# Patient Record
Sex: Female | Born: 1940 | Race: White | Hispanic: No | Marital: Married | Smoking: Never smoker
Health system: Southern US, Community
[De-identification: ages and names within clinical notes are randomized; demographics above are authoritative.]

## PROBLEM LIST (undated history)

## (undated) DIAGNOSIS — M1990 Unspecified osteoarthritis, unspecified site: Secondary | ICD-10-CM

## (undated) DIAGNOSIS — E785 Hyperlipidemia, unspecified: Secondary | ICD-10-CM

## (undated) DIAGNOSIS — K439 Ventral hernia without obstruction or gangrene: Secondary | ICD-10-CM

## (undated) DIAGNOSIS — B009 Herpesviral infection, unspecified: Secondary | ICD-10-CM

## (undated) DIAGNOSIS — I5030 Unspecified diastolic (congestive) heart failure: Secondary | ICD-10-CM

## (undated) DIAGNOSIS — C50912 Malignant neoplasm of unspecified site of left female breast: Secondary | ICD-10-CM

## (undated) DIAGNOSIS — J42 Unspecified chronic bronchitis: Secondary | ICD-10-CM

## (undated) DIAGNOSIS — K219 Gastro-esophageal reflux disease without esophagitis: Secondary | ICD-10-CM

## (undated) DIAGNOSIS — I890 Lymphedema, not elsewhere classified: Secondary | ICD-10-CM

## (undated) DIAGNOSIS — I10 Essential (primary) hypertension: Secondary | ICD-10-CM

## (undated) DIAGNOSIS — C50911 Malignant neoplasm of unspecified site of right female breast: Secondary | ICD-10-CM

## (undated) DIAGNOSIS — M779 Enthesopathy, unspecified: Secondary | ICD-10-CM

## (undated) DIAGNOSIS — R911 Solitary pulmonary nodule: Secondary | ICD-10-CM

## (undated) DIAGNOSIS — I441 Atrioventricular block, second degree: Secondary | ICD-10-CM

## (undated) DIAGNOSIS — K589 Irritable bowel syndrome without diarrhea: Secondary | ICD-10-CM

## (undated) DIAGNOSIS — E119 Type 2 diabetes mellitus without complications: Secondary | ICD-10-CM

## (undated) DIAGNOSIS — K760 Fatty (change of) liver, not elsewhere classified: Secondary | ICD-10-CM

## (undated) DIAGNOSIS — J449 Chronic obstructive pulmonary disease, unspecified: Secondary | ICD-10-CM

## (undated) DIAGNOSIS — J45909 Unspecified asthma, uncomplicated: Secondary | ICD-10-CM

## (undated) DIAGNOSIS — G4730 Sleep apnea, unspecified: Secondary | ICD-10-CM

## (undated) HISTORY — DX: Essential (primary) hypertension: I10

## (undated) HISTORY — PX: CATARACT EXTRACTION, BILATERAL: SHX1313

## (undated) HISTORY — DX: Unspecified asthma, uncomplicated: J45.909

## (undated) HISTORY — DX: Type 2 diabetes mellitus without complications: E11.9

## (undated) HISTORY — PX: TUBAL LIGATION: SHX77

## (undated) HISTORY — DX: Ventral hernia without obstruction or gangrene: K43.9

## (undated) HISTORY — PX: HAND LIGAMENT RECONSTRUCTION: SHX1726

## (undated) HISTORY — DX: Lymphedema, not elsewhere classified: I89.0

## (undated) HISTORY — DX: Fatty (change of) liver, not elsewhere classified: K76.0

## (undated) HISTORY — DX: Atrioventricular block, second degree: I44.1

## (undated) HISTORY — DX: Unspecified osteoarthritis, unspecified site: M19.90

## (undated) HISTORY — PX: BREAST LUMPECTOMY: SHX2

## (undated) HISTORY — DX: Herpesviral infection, unspecified: B00.9

## (undated) HISTORY — DX: Hyperlipidemia, unspecified: E78.5

## (undated) HISTORY — DX: Solitary pulmonary nodule: R91.1

## (undated) HISTORY — DX: Malignant neoplasm of unspecified site of right female breast: C50.911

## (undated) HISTORY — PX: COLON SURGERY: SHX602

## (undated) HISTORY — PX: CATARACT EXTRACTION W/ INTRAOCULAR LENS  IMPLANT, BILATERAL: SHX1307

## (undated) HISTORY — DX: Unspecified diastolic (congestive) heart failure: I50.30

## (undated) HISTORY — PX: BREAST SURGERY: SHX581

---
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
## 1986-04-19 HISTORY — PX: LUNG LOBECTOMY: SHX167

## 2011-04-20 DIAGNOSIS — K760 Fatty (change of) liver, not elsewhere classified: Secondary | ICD-10-CM

## 2011-04-20 DIAGNOSIS — R911 Solitary pulmonary nodule: Secondary | ICD-10-CM

## 2011-04-20 HISTORY — DX: Solitary pulmonary nodule: R91.1

## 2011-04-20 HISTORY — DX: Fatty (change of) liver, not elsewhere classified: K76.0

## 2011-04-20 HISTORY — PX: BREAST BIOPSY: SHX20

## 2011-05-13 DIAGNOSIS — E119 Type 2 diabetes mellitus without complications: Secondary | ICD-10-CM | POA: Diagnosis not present

## 2011-05-13 DIAGNOSIS — Z01818 Encounter for other preprocedural examination: Secondary | ICD-10-CM | POA: Diagnosis not present

## 2011-05-13 DIAGNOSIS — E785 Hyperlipidemia, unspecified: Secondary | ICD-10-CM | POA: Diagnosis not present

## 2011-05-13 DIAGNOSIS — G4733 Obstructive sleep apnea (adult) (pediatric): Secondary | ICD-10-CM | POA: Diagnosis not present

## 2011-05-13 DIAGNOSIS — J45909 Unspecified asthma, uncomplicated: Secondary | ICD-10-CM | POA: Diagnosis not present

## 2011-05-20 DIAGNOSIS — Z803 Family history of malignant neoplasm of breast: Secondary | ICD-10-CM | POA: Diagnosis not present

## 2011-05-20 DIAGNOSIS — C50919 Malignant neoplasm of unspecified site of unspecified female breast: Secondary | ICD-10-CM | POA: Diagnosis not present

## 2011-06-11 DIAGNOSIS — M259 Joint disorder, unspecified: Secondary | ICD-10-CM | POA: Diagnosis not present

## 2011-06-11 DIAGNOSIS — M25519 Pain in unspecified shoulder: Secondary | ICD-10-CM | POA: Diagnosis not present

## 2011-06-11 DIAGNOSIS — M25569 Pain in unspecified knee: Secondary | ICD-10-CM | POA: Diagnosis not present

## 2011-06-11 DIAGNOSIS — Z01818 Encounter for other preprocedural examination: Secondary | ICD-10-CM | POA: Diagnosis not present

## 2011-06-14 DIAGNOSIS — D0590 Unspecified type of carcinoma in situ of unspecified breast: Secondary | ICD-10-CM | POA: Diagnosis not present

## 2011-06-14 DIAGNOSIS — N6019 Diffuse cystic mastopathy of unspecified breast: Secondary | ICD-10-CM | POA: Diagnosis not present

## 2011-06-14 DIAGNOSIS — C50919 Malignant neoplasm of unspecified site of unspecified female breast: Secondary | ICD-10-CM | POA: Diagnosis not present

## 2011-06-14 DIAGNOSIS — N6489 Other specified disorders of breast: Secondary | ICD-10-CM | POA: Diagnosis not present

## 2011-06-18 DIAGNOSIS — M1710 Unilateral primary osteoarthritis, unspecified knee: Secondary | ICD-10-CM | POA: Diagnosis not present

## 2011-06-21 DIAGNOSIS — Z170 Estrogen receptor positive status [ER+]: Secondary | ICD-10-CM | POA: Diagnosis not present

## 2011-06-21 DIAGNOSIS — C50919 Malignant neoplasm of unspecified site of unspecified female breast: Secondary | ICD-10-CM | POA: Diagnosis not present

## 2011-06-22 DIAGNOSIS — C50919 Malignant neoplasm of unspecified site of unspecified female breast: Secondary | ICD-10-CM | POA: Diagnosis not present

## 2011-06-22 DIAGNOSIS — IMO0002 Reserved for concepts with insufficient information to code with codable children: Secondary | ICD-10-CM | POA: Diagnosis not present

## 2011-06-22 DIAGNOSIS — K7689 Other specified diseases of liver: Secondary | ICD-10-CM | POA: Diagnosis not present

## 2011-06-22 DIAGNOSIS — J8410 Pulmonary fibrosis, unspecified: Secondary | ICD-10-CM | POA: Diagnosis not present

## 2011-06-22 DIAGNOSIS — K449 Diaphragmatic hernia without obstruction or gangrene: Secondary | ICD-10-CM | POA: Diagnosis not present

## 2011-06-22 DIAGNOSIS — K439 Ventral hernia without obstruction or gangrene: Secondary | ICD-10-CM | POA: Diagnosis not present

## 2011-06-25 DIAGNOSIS — C50919 Malignant neoplasm of unspecified site of unspecified female breast: Secondary | ICD-10-CM | POA: Diagnosis not present

## 2011-06-25 DIAGNOSIS — Z170 Estrogen receptor positive status [ER+]: Secondary | ICD-10-CM | POA: Diagnosis not present

## 2011-06-25 DIAGNOSIS — Z01818 Encounter for other preprocedural examination: Secondary | ICD-10-CM | POA: Diagnosis not present

## 2011-07-07 DIAGNOSIS — C50919 Malignant neoplasm of unspecified site of unspecified female breast: Secondary | ICD-10-CM | POA: Diagnosis not present

## 2011-07-07 DIAGNOSIS — Z170 Estrogen receptor positive status [ER+]: Secondary | ICD-10-CM | POA: Diagnosis not present

## 2011-07-20 DIAGNOSIS — C50019 Malignant neoplasm of nipple and areola, unspecified female breast: Secondary | ICD-10-CM | POA: Diagnosis not present

## 2011-10-08 ENCOUNTER — Encounter: Payer: Self-pay | Admitting: Family Medicine

## 2011-10-08 ENCOUNTER — Ambulatory Visit (INDEPENDENT_AMBULATORY_CARE_PROVIDER_SITE_OTHER): Payer: Medicare Other | Admitting: Family Medicine

## 2011-10-08 VITALS — BP 138/58 | HR 97 | Resp 18 | Ht 65.5 in | Wt 259.1 lb

## 2011-10-08 DIAGNOSIS — C50919 Malignant neoplasm of unspecified site of unspecified female breast: Secondary | ICD-10-CM | POA: Diagnosis not present

## 2011-10-08 DIAGNOSIS — G4733 Obstructive sleep apnea (adult) (pediatric): Secondary | ICD-10-CM

## 2011-10-08 DIAGNOSIS — M1990 Unspecified osteoarthritis, unspecified site: Secondary | ICD-10-CM

## 2011-10-08 DIAGNOSIS — E119 Type 2 diabetes mellitus without complications: Secondary | ICD-10-CM | POA: Diagnosis not present

## 2011-10-08 DIAGNOSIS — R5381 Other malaise: Secondary | ICD-10-CM

## 2011-10-08 DIAGNOSIS — H269 Unspecified cataract: Secondary | ICD-10-CM

## 2011-10-08 DIAGNOSIS — I10 Essential (primary) hypertension: Secondary | ICD-10-CM

## 2011-10-08 DIAGNOSIS — J45909 Unspecified asthma, uncomplicated: Secondary | ICD-10-CM

## 2011-10-08 DIAGNOSIS — R5382 Chronic fatigue, unspecified: Secondary | ICD-10-CM

## 2011-10-08 DIAGNOSIS — E785 Hyperlipidemia, unspecified: Secondary | ICD-10-CM

## 2011-10-08 MED ORDER — ANASTROZOLE 1 MG PO TABS: 1.0000 mg | ORAL_TABLET | Freq: Every day | ORAL | Status: DC

## 2011-10-08 NOTE — Patient Instructions (Addendum)
I will get your records  I will send your medications to [facility] I will refer you to oncology  F/U 2 months

## 2011-10-11 ENCOUNTER — Encounter: Payer: Self-pay | Admitting: Family Medicine

## 2011-10-11 DIAGNOSIS — J45909 Unspecified asthma, uncomplicated: Secondary | ICD-10-CM | POA: Insufficient documentation

## 2011-10-11 DIAGNOSIS — E119 Type 2 diabetes mellitus without complications: Secondary | ICD-10-CM | POA: Insufficient documentation

## 2011-10-11 DIAGNOSIS — G4733 Obstructive sleep apnea (adult) (pediatric): Secondary | ICD-10-CM | POA: Insufficient documentation

## 2011-10-11 DIAGNOSIS — R5382 Chronic fatigue, unspecified: Secondary | ICD-10-CM | POA: Insufficient documentation

## 2011-10-11 DIAGNOSIS — C50912 Malignant neoplasm of unspecified site of left female breast: Secondary | ICD-10-CM | POA: Insufficient documentation

## 2011-10-11 DIAGNOSIS — E669 Obesity, unspecified: Secondary | ICD-10-CM | POA: Insufficient documentation

## 2011-10-11 DIAGNOSIS — E785 Hyperlipidemia, unspecified: Secondary | ICD-10-CM | POA: Insufficient documentation

## 2011-10-11 DIAGNOSIS — M1990 Unspecified osteoarthritis, unspecified site: Secondary | ICD-10-CM | POA: Insufficient documentation

## 2011-10-11 DIAGNOSIS — I10 Essential (primary) hypertension: Secondary | ICD-10-CM | POA: Insufficient documentation

## 2011-10-11 DIAGNOSIS — E1169 Type 2 diabetes mellitus with other specified complication: Secondary | ICD-10-CM | POA: Insufficient documentation

## 2011-10-11 MED ORDER — ANASTROZOLE 1 MG PO TABS: 1.0000 mg | ORAL_TABLET | Freq: Every day | ORAL | Status: DC

## 2011-10-11 MED ORDER — TRIAMTERENE-HCTZ 75-50 MG PO TABS: ORAL_TABLET | ORAL | Status: DC

## 2011-10-11 MED ORDER — MONTELUKAST SODIUM 10 MG PO TABS: 10.0000 mg | ORAL_TABLET | Freq: Every day | ORAL | Status: DC

## 2011-10-11 MED ORDER — NAPROXEN 500 MG PO TABS: 500.0000 mg | ORAL_TABLET | Freq: Two times a day (BID) | ORAL | Status: DC | PRN

## 2011-10-11 MED ORDER — GLIPIZIDE 10 MG PO TABS: 10.0000 mg | ORAL_TABLET | Freq: Two times a day (BID) | ORAL | Status: DC

## 2011-10-11 MED ORDER — MOMETASONE FURO-FORMOTEROL FUM 100-5 MCG/ACT IN AERO: 2.0000 | INHALATION_SPRAY | Freq: Two times a day (BID) | RESPIRATORY_TRACT | Status: DC

## 2011-10-11 MED ORDER — METFORMIN HCL 1000 MG PO TABS: 1000.0000 mg | ORAL_TABLET | Freq: Two times a day (BID) | ORAL | Status: DC

## 2011-10-11 MED ORDER — SIMVASTATIN 40 MG PO TABS: 40.0000 mg | ORAL_TABLET | Freq: Every evening | ORAL | Status: DC

## 2011-10-11 NOTE — Assessment & Plan Note (Signed)
Maintained on inhalers pt would like to establish with pulmonary

## 2011-10-11 NOTE — Assessment & Plan Note (Signed)
Blood pressure, suboptimal today, will recheck next visit she may need entire tablet

## 2011-10-11 NOTE — Assessment & Plan Note (Signed)
Left breast cancer, recently diagnosed will refer to oncology for further treatments on Arimidex

## 2011-10-11 NOTE — Assessment & Plan Note (Signed)
No oxygen no CPAP currently

## 2011-10-11 NOTE — Assessment & Plan Note (Signed)
Chronic problem, multiple co-morbidites, sedentary

## 2011-10-11 NOTE — Progress Notes (Addendum)
  Subjective:    Patient ID: Julie Clay, female    DOB: April 04, 1941, 71 y.o.   MRN: [id number]  HPI Patient here to establish care. She was a previous patient of [facility] in [place]. Moved here a few weeks ago.   Breast Cancer: She was recently diagnosed with breast cancer she is currently on Arimdex diagnosed December 2012 in the left breast. She needs followup with an oncologist for further treatment.  DM- A1C unknown on Metofrmin/glipizide, does not check CBG on regular basis, she stopped actos due to potential side effect of bladder cancer  HTN- takes on 1/2 tablet of her maxide daily, also used for leg swelling Asthma-diagnosed in childhood she is maintained on albuterol and Dulera Chronic fatigue- has suffered for many years, unable to loose weight, has history of OSA but no CPAP OA- followed by ortho rarely uses Motrin,also has heel spurs, she uses a motorized scooter, gets injections in knees as needed   Specialist needed- Oncology/Pulmonary/Opthalmology/Orthopedic  Bone Density-done and normal  Refuses Colonoscopy  Wears orthotics for recurrent blistering on foot High allergy to [place]- has Rhus Toxcodans pills at home    Review of Systems - per above    GEN- +fatigue, fever, weight loss,weakness, recent illness HEENT- denies eye drainage, change in vision, nasal discharge, CVS- denies chest pain, palpitations, +,leg swelling RESP- denies SOB, cough, wheeze ABD- denies N/V, change in stools, abd pain GU- denies dysuria, hematuria, dribbling, incontinence MSK- denies joint pain, muscle aches, injury Neuro- denies headache, dizziness, syncope, seizure activity      Objective:   Physical Exam GEN- NAD, alert and oriented x3, obese  HEENT- PERRL, EOMI, non injected sclera, pink conjunctiva, MMM, oropharynx clear Neck- Supple,no LAD CVS- RRR, no murmur RESP-CTAB ABD-NABS,soft, NT,large pannus EXT- +pedal  edema Pulses- Radial, DP-  2+        Assessment & Plan:    45 minutes spent with pt on NP appt

## 2011-10-11 NOTE — Assessment & Plan Note (Signed)
Continue zocor 

## 2011-10-11 NOTE — Assessment & Plan Note (Signed)
Obtain records, refer to opthalmology  Continue oral meds

## 2011-10-11 NOTE — Addendum Note (Signed)
Addended by: [name] F on: 10/11/2011 02:06 PM   Modules accepted: Orders

## 2011-10-12 ENCOUNTER — Encounter: Payer: Self-pay | Admitting: Family Medicine

## 2011-10-12 ENCOUNTER — Telehealth: Payer: Self-pay | Admitting: Family Medicine

## 2011-10-12 NOTE — Telephone Encounter (Signed)
Please let pt know she needs to go to appt to establish care and see whether or not radiation adjunct is needed

## 2011-10-13 ENCOUNTER — Telehealth: Payer: Self-pay | Admitting: Family Medicine

## 2011-10-13 NOTE — Telephone Encounter (Signed)
Spoke with patient and at this time she is refusing the appt.

## 2011-10-13 NOTE — Telephone Encounter (Signed)
I spoke to pt and she said they told her she would be seeing a chemo Dr and she didn't need chemo. The office called back and said she refused appt, were you going to convince her to go or do they give it to someone else?

## 2011-10-14 ENCOUNTER — Telehealth: Payer: Self-pay | Admitting: Family Medicine

## 2011-10-14 NOTE — Telephone Encounter (Signed)
Records were faxed to them already, and now sent to scanner, we do not have anything else handy.

## 2011-10-14 NOTE — Telephone Encounter (Signed)
Do you still have the records from her previous Dr or have they went to be scanned already?  They are wanting them faxed over to them

## 2011-10-14 NOTE — Telephone Encounter (Signed)
Spoke with pt, she will call and reschedule appt, she has new diagnosis of breast cancer, she needs radiation per pt, she needs to establish with the cancer center so that care can be coordinated. She agrees to go to appt to do so

## 2011-10-15 ENCOUNTER — Encounter (HOSPITAL_COMMUNITY): Payer: Medicare Other | Attending: Oncology | Admitting: Oncology

## 2011-10-15 ENCOUNTER — Encounter (HOSPITAL_COMMUNITY): Payer: Self-pay | Admitting: Oncology

## 2011-10-15 VITALS — BP 132/75 | HR 72 | Temp 98.2°F | Ht 64.5 in | Wt 259.6 lb

## 2011-10-15 DIAGNOSIS — E785 Hyperlipidemia, unspecified: Secondary | ICD-10-CM | POA: Diagnosis not present

## 2011-10-15 DIAGNOSIS — I10 Essential (primary) hypertension: Secondary | ICD-10-CM | POA: Diagnosis not present

## 2011-10-15 DIAGNOSIS — Z170 Estrogen receptor positive status [ER+]: Secondary | ICD-10-CM

## 2011-10-15 DIAGNOSIS — C50919 Malignant neoplasm of unspecified site of unspecified female breast: Secondary | ICD-10-CM

## 2011-10-15 NOTE — Patient Instructions (Addendum)
Julie Clay  [id number] Nov 08, 1940 Dr. Glenford Peers   [facility]  Discharge Instructions  RECOMMENDATIONS MADE BY THE CONSULTANT AND ANY TEST RESULTS WILL BE SENT TO YOUR REFERRING DOCTOR.   EXAM FINDINGS BY MD TODAY AND SIGNS AND SYMPTOMS TO REPORT TO CLINIC OR PRIMARY MD: Exam and discussion per MD.  He will be in touch with you after he talks with the Radiation Oncologist.  If you have not heard from him by Wednesday, call us.  Tobie Lords, RN at [phone])  MEDICATIONS PRESCRIBED: continue arimidex   INSTRUCTIONS GIVEN AND DISCUSSED: Other :  Report any new lumps, bone pain or shortness of breath.  SPECIAL INSTRUCTIONS/FOLLOW-UP: Return to Clinic in 6 months.   I acknowledge that I have been informed and understand all the instructions given to me and received a copy. I do not have any more questions at this time, but understand that I may call the Specialty Clinic at [facility] at [phone] during business hours should I have any further questions or need assistance in obtaining follow-up care.    __________________________________________  _____________  __________ Signature of Patient or Authorized Representative            Date                   Time    __________________________________________ Nurse's Signature
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
## 2011-10-15 NOTE — Telephone Encounter (Signed)
Office is aware.

## 2011-10-20 ENCOUNTER — Telehealth (HOSPITAL_COMMUNITY): Payer: Self-pay | Admitting: Oncology

## 2011-11-01 ENCOUNTER — Other Ambulatory Visit: Payer: Self-pay | Admitting: Family Medicine

## 2011-11-02 ENCOUNTER — Other Ambulatory Visit (HOSPITAL_COMMUNITY): Payer: Self-pay | Admitting: Oncology

## 2011-11-02 ENCOUNTER — Telehealth (HOSPITAL_COMMUNITY): Payer: Self-pay | Admitting: Oncology

## 2011-11-02 NOTE — Progress Notes (Signed)
This lady had breast cancer resected on 05/20/2011 followed by reexcision in February 2013. I finally received the pathology reports and she had a 1.5 x 1.2 x 1.0 cm tumor with reexcision showing no further residual disease. Her initial biopsy was in December 2012 with ER PR receptors and HER-2 evaluation done on the tumor at that time. Oncotype was also done. Her ER receptor were strongly positive as were her progesterone receptors and her HER-2 was nonamplified. Her Oncotype breast cancer recurrence score was 0 giving her a risk of recurrence at 10 years of 3% after 5 years of tamoxifen.  Her sentinel lymph node was negative as well and this was a grade 2 tumor, not a grade 3 tumor as said in one of the notes. Therefore we have received the pathology reports and we will try to scan and into the system.

## 2011-11-04 DIAGNOSIS — H52229 Regular astigmatism, unspecified eye: Secondary | ICD-10-CM | POA: Diagnosis not present

## 2011-11-04 DIAGNOSIS — E119 Type 2 diabetes mellitus without complications: Secondary | ICD-10-CM | POA: Diagnosis not present

## 2011-11-04 DIAGNOSIS — Z0100 Encounter for examination of eyes and vision without abnormal findings: Secondary | ICD-10-CM | POA: Diagnosis not present

## 2011-11-04 DIAGNOSIS — Z961 Presence of intraocular lens: Secondary | ICD-10-CM | POA: Diagnosis not present

## 2011-11-11 ENCOUNTER — Telehealth: Payer: Self-pay | Admitting: Family Medicine

## 2011-11-11 ENCOUNTER — Other Ambulatory Visit: Payer: Self-pay

## 2011-11-11 DIAGNOSIS — M1710 Unilateral primary osteoarthritis, unspecified knee: Secondary | ICD-10-CM

## 2011-11-11 DIAGNOSIS — M179 Osteoarthritis of knee, unspecified: Secondary | ICD-10-CM

## 2011-11-11 MED ORDER — ALBUTEROL SULFATE HFA 108 (90 BASE) MCG/ACT IN AERS: 2.0000 | INHALATION_SPRAY | Freq: Four times a day (QID) | RESPIRATORY_TRACT | Status: DC | PRN

## 2011-11-11 MED ORDER — GLUCOSE BLOOD VI STRP: ORAL_STRIP | Status: DC

## 2011-11-11 NOTE — Telephone Encounter (Signed)
Pt needs to complete the [facility] form for [place] to obtain records, please let her know and apologize for any inconvience I will refer for her OA of the knee to ortho for injections

## 2011-11-11 NOTE — Telephone Encounter (Signed)
Pt aware and will come by tomorrow

## 2011-11-12 ENCOUNTER — Other Ambulatory Visit: Payer: Self-pay

## 2011-11-12 MED ORDER — GLUCOSE BLOOD VI STRP: ORAL_STRIP | Status: AC

## 2011-11-12 MED ORDER — ALBUTEROL SULFATE HFA 108 (90 BASE) MCG/ACT IN AERS: 2.0000 | INHALATION_SPRAY | Freq: Four times a day (QID) | RESPIRATORY_TRACT | Status: DC | PRN

## 2011-11-15 ENCOUNTER — Ambulatory Visit (INDEPENDENT_AMBULATORY_CARE_PROVIDER_SITE_OTHER): Payer: Medicare Other | Admitting: Internal Medicine

## 2011-11-15 ENCOUNTER — Encounter: Payer: Self-pay | Admitting: Internal Medicine

## 2011-11-15 VITALS — BP 122/76 | HR 82 | Temp 98.7°F | Ht 65.0 in | Wt 261.2 lb

## 2011-11-15 DIAGNOSIS — J45909 Unspecified asthma, uncomplicated: Secondary | ICD-10-CM | POA: Diagnosis not present

## 2011-11-15 DIAGNOSIS — R0989 Other specified symptoms and signs involving the circulatory and respiratory systems: Secondary | ICD-10-CM | POA: Diagnosis not present

## 2011-11-15 DIAGNOSIS — R0600 Dyspnea, unspecified: Secondary | ICD-10-CM

## 2011-11-15 DIAGNOSIS — R0609 Other forms of dyspnea: Secondary | ICD-10-CM | POA: Diagnosis not present

## 2011-11-15 NOTE — Assessment & Plan Note (Signed)
Symptoms are markedly disproportionate to objective findings and not clear this is a lung problem but pt does appear to have difficult airway management issues. DDX of  difficult airways managment all start with A and  include Adherence, Ace Inhibitors, Acid Reflux, Active Sinus Disease, Alpha 1 Antitripsin deficiency, Anxiety masquerading as Airways dz,  ABPA,  allergy(esp in young), Aspiration (esp in elderly), Adverse effects of DPI,  Active smokers, plus two Bs  = Bronchiectasis and Beta blocker use..and one C= CHF  Adherence is always the initial "prime suspect" and is a multilayered concern that requires a "trust but verify" approach in every patient - starting with knowing how to use medications, especially inhalers, correctly, keeping up with refills and understanding the fundamental difference between maintenance and prns vs those medications only taken for a very short course and then stopped and not refilled.  The proper method of use, as well as anticipated side effects, of a metered-dose inhaler are discussed and demonstrated to the patient. Improved effectiveness after extensive coaching during this visit to a level of approximately  50% and needs more work for optimal delivery  ? Acid reflux > diet reviewed

## 2011-11-15 NOTE — Patient Instructions (Addendum)
dulera 100 Take 2 puffs first thing in am and then another 2 puffs about 12 hours later.     Only use your albuterol as a rescue medication(plan B= albuterol hfa/puffer and plan C nebulizer)  to be used if you can't catch your breath by resting or doing a relaxed purse lip breathing pattern. The less you use it, the better it will work when you need it.   GERD (REFLUX)  is an extremely common cause of respiratory symptoms, many times with no significant heartburn at all.    It can be treated with medication, but also with lifestyle changes including avoidance of late meals, excessive alcohol, smoking cessation, and avoid fatty foods, chocolate, peppermint, colas, red wine, and acidic juices such as orange juice.  NO MINT OR MENTHOL PRODUCTS SO NO COUGH DROPS  USE SUGARLESS CANDY INSTEAD (jolley ranchers or Stover's)  NO OIL BASED VITAMINS - use powdered substitutes - STOP FISH OIL   Please schedule a follow up office visit in 6 weeks, call sooner if needed

## 2011-11-15 NOTE — Progress Notes (Signed)
  Subjective:    Patient ID: Julie Clay, female    DOB: [birth year]   MRN: [id number]  HPI  71 yowm never smoker with dx of asthma age 71 weeks but did not need daily medications [phone] with tendency to flares of bronchits fall/winter overall satisfied with control but always sob with exertion x making a bed or walking x 100 ft referred 69/29/2013 to pulmonary clinic by DR Riesa Pope.  69/29/2013 1st pulmonary cc sob x decades with exertion without much variation unless having flare of bronchitis with  min chronic cough. First thing in am uses dulera and albuterol always first..  singulair may have helped last year get over an episode of bronchitis, not clear though she needs to maintain on it. No overt hb or sinus congestion  Sleeping ok without nocturnal  or early am exacerbation  of respiratory  c/o's or need for noct saba. Also denies any obvious fluctuation of symptoms with weather or environmental changes or other aggravating or alleviating factors except as outlined above   Review of Systems  Constitutional: Negative for fever and unexpected weight change.  HENT: Positive for postnasal drip. Negative for ear pain, nosebleeds, congestion, sore throat, rhinorrhea, sneezing, trouble swallowing, dental problem and sinus pressure.   Eyes: Positive for redness and itching.  Respiratory: Positive for cough, shortness of breath and wheezing. Negative for chest tightness.   Cardiovascular: Positive for leg swelling. Negative for chest pain and palpitations.  Gastrointestinal: Negative for nausea and vomiting.  Genitourinary: Negative for dysuria.  Musculoskeletal: Negative for joint swelling.  Skin: Negative for rash.  Neurological: Negative for headaches.  Hematological: Bruises/bleeds easily.  Psychiatric/Behavioral: Negative for dysphoric mood. The patient is not nervous/anxious.        Objective:   Physical Exam  Obese wf with classic pseudowheeze resolves with plm Wt Readings from  Last 3 Encounters:  11/15/11 261 lb 3.2 oz (118.48 kg)  10/15/11 259 lb 9.6 oz (117.754 kg)  10/08/11 259 lb 1.3 oz (117.518 kg)    HEENT: nl dentition, turbinates, and orophanx. Nl external ear canals without cough reflex   NECK :  without JVD/Nodes/TM/ nl carotid upstrokes bilaterally   LUNGS: no acc muscle use, clear to A and P bilaterally though bs are distant without cough on insp or exp maneuvers   CV:  RRR  no s3 or murmur or increase in P2, no edema   ABD:  Massively obese/ soft and nontender with limited excursion in the supine position. No bruits or organomegaly, bowel sounds nl  MS:  warm without deformities, calf tenderness, cyanosis or clubbing  SKIN: warm and dry without lesions    NEURO:  alert, approp, no deficits    CT chest 06/25/11 Non specific  Tiny nodules and post op changes      Assessment & Plan:

## 2011-11-15 NOTE — Assessment & Plan Note (Signed)
She actually walked at a very fast pace x 185 ft x 2 for [place] today with pseudowheeze that responded to purse lip maneuver.  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

## 2011-12-08 ENCOUNTER — Ambulatory Visit: Payer: Medicare Other | Admitting: Orthopedic Surgery

## 2011-12-08 ENCOUNTER — Ambulatory Visit: Payer: Medicare Other | Admitting: Family Medicine

## 2011-12-09 ENCOUNTER — Ambulatory Visit (INDEPENDENT_AMBULATORY_CARE_PROVIDER_SITE_OTHER): Payer: Medicare Other | Admitting: Family Medicine

## 2011-12-09 ENCOUNTER — Encounter: Payer: Self-pay | Admitting: Family Medicine

## 2011-12-09 VITALS — BP 140/62 | HR 104 | Resp 18 | Ht 65.5 in | Wt 261.0 lb

## 2011-12-09 DIAGNOSIS — I10 Essential (primary) hypertension: Secondary | ICD-10-CM | POA: Diagnosis not present

## 2011-12-09 DIAGNOSIS — E785 Hyperlipidemia, unspecified: Secondary | ICD-10-CM

## 2011-12-09 DIAGNOSIS — E119 Type 2 diabetes mellitus without complications: Secondary | ICD-10-CM | POA: Diagnosis not present

## 2011-12-09 DIAGNOSIS — J45909 Unspecified asthma, uncomplicated: Secondary | ICD-10-CM

## 2011-12-09 DIAGNOSIS — B009 Herpesviral infection, unspecified: Secondary | ICD-10-CM

## 2011-12-09 LAB — CBC
HCT: 37.7 % (ref 36.0–46.0)
Hemoglobin: 12.7 g/dL (ref 12.0–15.0)
MCHC: 33.7 g/dL (ref 30.0–36.0)
WBC: 9 10*3/uL (ref 4.0–10.5)

## 2011-12-09 LAB — BASIC METABOLIC PANEL
Glucose, Bld: 208 mg/dL — ABNORMAL HIGH (ref 70–99)
Potassium: 4.4 mEq/L (ref 3.5–5.3)
Sodium: 143 mEq/L (ref 135–145)

## 2011-12-09 LAB — LDL CHOLESTEROL, DIRECT: Direct LDL: 102 mg/dL — ABNORMAL HIGH

## 2011-12-09 NOTE — Assessment & Plan Note (Signed)
Currently stable, she is to have PFT in Sept

## 2011-12-09 NOTE — Assessment & Plan Note (Signed)
Unchanged, she is not very active limited by OA as well,

## 2011-12-09 NOTE — Assessment & Plan Note (Signed)
BP suboptimal, she took meds 30 minutes before visit, will trend, no ACEI on for DM, will need to review her records to get a better sense of her medical problems and history

## 2011-12-09 NOTE — Assessment & Plan Note (Signed)
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 If she does have an outbreak coming off of the acyclovir we'll try to dose her an acute setting with 200 mg 5 times a day x 1 week , if I find she has increased flares, will restart the maintanance dosing

## 2011-12-09 NOTE — Assessment & Plan Note (Signed)
Check A1C, metabolic panel Continue oral meds Podiatry referral

## 2011-12-09 NOTE — Patient Instructions (Addendum)
Labs to be done today Call if you get a flare of the herpes, for instructions on how to take acyclovir  Continue other medications  Podiatry referral F/U 3 months

## 2011-12-09 NOTE — Assessment & Plan Note (Signed)
Check direct LDL 

## 2011-12-09 NOTE — Progress Notes (Signed)
  Subjective:    Patient ID: Julie Clay, female    DOB: 11-22-1940, 71 y.o.   MRN: [id number]  HPI Pt here for routine follow-up. She has no specific concerns today. Medications reviewed We are still awaiting her records from [facility] Breast cancer-she's been seen by oncology decision has been made to continue Armiedex, no radiation therapy Asthma- pulmonary note reviewed, she is having precautions for acid reflux and she is also using her albuterol now as needed Dulera daily DM- does not take CBG on regular basis, no hypoglycemia  Review of Systems  GEN- denies fatigue, fever, weight loss,weakness, recent illness HEENT- denies eye drainage, change in vision, nasal discharge, CVS- denies chest pain, palpitations RESP- denies SOB, cough, wheeze ABD- denies N/V, change in stools, abd pain GU- denies dysuria, hematuria, dribbling, incontinence MSK- denies joint pain, muscle aches, injury Neuro- denies headache, dizziness, syncope, seizure activity      Objective:   Physical Exam GEN- NAD, alert and oriented x3 HEENT- PERRL, EOMI, non injected sclera, pink conjunctiva, MMM, oropharynx clear Neck- Supple, no thryomegaly CVS- RRR, no murmur RESP-CTAB ABD-NABS,soft,NT,ND EXT- No edema Pulses- Radial, DP- 2+        Assessment & Plan:

## 2012-01-05 ENCOUNTER — Ambulatory Visit: Payer: Medicare Other | Admitting: Internal Medicine

## 2012-01-11 DIAGNOSIS — M898X9 Other specified disorders of bone, unspecified site: Secondary | ICD-10-CM | POA: Diagnosis not present

## 2012-01-11 DIAGNOSIS — B353 Tinea pedis: Secondary | ICD-10-CM | POA: Diagnosis not present

## 2012-01-11 DIAGNOSIS — M722 Plantar fascial fibromatosis: Secondary | ICD-10-CM | POA: Diagnosis not present

## 2012-01-11 DIAGNOSIS — E1149 Type 2 diabetes mellitus with other diabetic neurological complication: Secondary | ICD-10-CM | POA: Diagnosis not present

## 2012-01-14 ENCOUNTER — Encounter: Payer: Self-pay | Admitting: Family Medicine

## 2012-01-14 ENCOUNTER — Ambulatory Visit (INDEPENDENT_AMBULATORY_CARE_PROVIDER_SITE_OTHER): Payer: Medicare Other | Admitting: Family Medicine

## 2012-01-14 VITALS — BP 138/70 | HR 88 | Resp 15 | Ht 65.5 in | Wt 254.1 lb

## 2012-01-14 DIAGNOSIS — M1990 Unspecified osteoarthritis, unspecified site: Secondary | ICD-10-CM

## 2012-01-14 DIAGNOSIS — Z23 Encounter for immunization: Secondary | ICD-10-CM | POA: Diagnosis not present

## 2012-01-14 DIAGNOSIS — H9209 Otalgia, unspecified ear: Secondary | ICD-10-CM

## 2012-01-14 MED ORDER — ANTIPYRINE-BENZOCAINE 5.4-1.4 % OT SOLN: 3.0000 [drp] | Freq: Three times a day (TID) | OTIC | Status: DC | PRN

## 2012-01-14 NOTE — Assessment & Plan Note (Signed)
Unable to see ortho until records come in , acetaminophen helps some. Will have her use medrol dosepak in meantime for back pain and joints. She tells me she already has a course at home from previous PCP because of her posion IVY which has not been used yet.

## 2012-01-14 NOTE — Progress Notes (Signed)
  Subjective:    Patient ID: Julie Clay, female    DOB: 1940/08/10, 71 y.o.   MRN: [id number]  HPI Pt here with ear popping and pain on and off for past 3 weeks in left ear , no drainage from ear, no cough, no SOB, no sorethroat No sinus drainage. Due for flu shot Has not received records, therefore ortho cancelled appt for her joint pain. She has typical pain in shoulder and knee. Tylenol helps lower back.   Review of Systems  GEN- denies fatigue, fever, weight loss,weakness, recent illness HEENT- denies eye drainage, change in vision, nasal discharge, CVS- denies chest pain, palpitations RESP- denies SOB, cough, wheeze ABD- denies N/V, change in stools, abd pain GU- denies dysuria, hematuria, dribbling, incontinence MSK- + joint pain, muscle aches, injury Neuro- denies headache, dizziness, syncope, seizure activity      Objective:   Physical Exam GEN- NAD, alert and oriented x3 HEENT- PERRL, EOMI, non injected sclera, pink conjunctiva, MMM, oropharynx clear, TM clear bilat, no effusion, nares clear, canals clear  Neck- Supple, no LAD  CVS- RRR, no murmur RESP-CTAB Pulse- Radial 2+         Assessment & Plan:

## 2012-01-14 NOTE — Patient Instructions (Signed)
Flu shot given  Use the ear drop as directed  Call if you do not improve Start the medrol dosepak you have at home for your joints  Keep previous follow-up appointment

## 2012-01-14 NOTE — Assessment & Plan Note (Signed)
No effusion,no infection, AB otic given

## 2012-02-03 ENCOUNTER — Ambulatory Visit: Payer: Medicare Other | Admitting: Internal Medicine

## 2012-02-04 ENCOUNTER — Ambulatory Visit (INDEPENDENT_AMBULATORY_CARE_PROVIDER_SITE_OTHER): Payer: Medicare Other | Admitting: Family Medicine

## 2012-02-04 ENCOUNTER — Encounter: Payer: Self-pay | Admitting: Family Medicine

## 2012-02-04 VITALS — BP 144/64 | HR 88 | Wt 259.1 lb

## 2012-02-04 DIAGNOSIS — IMO0001 Reserved for inherently not codable concepts without codable children: Secondary | ICD-10-CM | POA: Diagnosis not present

## 2012-02-04 DIAGNOSIS — A630 Anogenital (venereal) warts: Secondary | ICD-10-CM

## 2012-02-04 DIAGNOSIS — I10 Essential (primary) hypertension: Secondary | ICD-10-CM

## 2012-02-04 DIAGNOSIS — E119 Type 2 diabetes mellitus without complications: Secondary | ICD-10-CM

## 2012-02-04 DIAGNOSIS — R609 Edema, unspecified: Secondary | ICD-10-CM

## 2012-02-04 LAB — BASIC METABOLIC PANEL
BUN: 17 mg/dL (ref 6–23)
CO2: 27 mEq/L (ref 19–32)
Calcium: 9.8 mg/dL (ref 8.4–10.5)
Creat: 0.83 mg/dL (ref 0.50–1.10)
Glucose, Bld: 262 mg/dL — ABNORMAL HIGH (ref 70–99)
Sodium: 140 mEq/L (ref 135–145)

## 2012-02-04 MED ORDER — IMIQUIMOD 5 % EX CREA: TOPICAL_CREAM | CUTANEOUS | Status: DC

## 2012-02-04 NOTE — Progress Notes (Signed)
  Subjective:    Patient ID: Julie Clay, female    DOB: [birth year], 71 y.o.   MRN: [id number]  HPI Patient presents to followup chronic medical problems. She's concerned about anal itching for the past 2 weeks. She's used Lidex cream A. and D. ointment triple antibiotic ointment and soaks and that however these have not helped. She feels small little bumps when she checks her bottom and they are spreading. 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 Leg swelling for the past 2 days she typically does not have severe leg swelling she denies any shortness of breath or chest pain she is currently taking one half tablet of Maxzide for blood pressure   Review of Systems - per above  GEN- denies fatigue, fever, weight loss,weakness, recent illness HEENT- denies eye drainage, change in vision, nasal discharge, CVS- denies chest pain, palpitations, +leg swelling  RESP- denies SOB, cough, wheeze ABD- denies N/V, change in stools, abd pain GU- denies dysuria, hematuria, dribbling, incontinence       Objective:   Physical Exam GEN- NAD, alert and oriented x3 HEENT- PERRL, EOMI, non injected sclera, pink conjunctiva, MMM, oropharynx clear, TM clear bilat, no effusion, nares clear, canals clear  Neck- Supple, no LAD  CVS- RRR, no murmur RESP-CTAB Rectum- skin tag noted, no bleeding noted,normal tone Skin- multiple shallow based ulcers surrounding anus and extending into gluteal cleft with erythema, with few tiny condyloma noted Pulse- Radial 2+  Ext- 1+ edema pedal        Assessment & Plan:

## 2012-02-04 NOTE — Patient Instructions (Addendum)
Increase maxide to 1/2 tablet twice a day  Use cream as directed on anus- Monday, wed, Friday  Get labs drawn F/u 6 Weeks

## 2012-02-04 NOTE — Assessment & Plan Note (Signed)
Uncontrolled, repeat A1C, obtain BMET

## 2012-02-04 NOTE — Assessment & Plan Note (Signed)
Increase maxide

## 2012-02-04 NOTE — Assessment & Plan Note (Addendum)
Elevate legs, increase Maxzide to 1/2 table twice a day , if this worsens or does not improve consider ECHO for possible RHF , no other signs today

## 2012-02-04 NOTE — Assessment & Plan Note (Signed)
Will treat with Aldara, based on appearance history and a few small wart lesions already seen, f/u 6 weeks if not improved, refer to derm

## 2012-02-07 ENCOUNTER — Telehealth: Payer: Self-pay | Admitting: Family Medicine

## 2012-02-07 MED ORDER — SITAGLIPTIN PHOSPHATE 50 MG PO TABS: 50.0000 mg | ORAL_TABLET | Freq: Every day | ORAL | Status: DC

## 2012-02-07 MED ORDER — FUROSEMIDE 20 MG PO TABS: 20.0000 mg | ORAL_TABLET | Freq: Every day | ORAL | Status: DC

## 2012-02-07 NOTE — Telephone Encounter (Signed)
Pt aware.

## 2012-02-07 NOTE — Telephone Encounter (Addendum)
Please give pt message, A1C was still 7.7, I am going to add medication called Januvia, along with her metformin and Glipizide for her blood sugars  She can decrease her maxide back to 1/2 tablet daily, start lasix 20mg  daily for the next week for leg swelling, take in the morning , there will be more in the bottle than needed

## 2012-02-07 NOTE — Telephone Encounter (Signed)
States the increase of her BP med has made no difference in the edema in her legs. Also her fasting sugar this am was 188. Wants to know what you want to do

## 2012-02-08 DIAGNOSIS — B353 Tinea pedis: Secondary | ICD-10-CM | POA: Diagnosis not present

## 2012-02-14 ENCOUNTER — Encounter: Payer: Self-pay | Admitting: Family Medicine

## 2012-02-24 ENCOUNTER — Encounter: Payer: Self-pay | Admitting: Family Medicine

## 2012-02-24 ENCOUNTER — Ambulatory Visit (INDEPENDENT_AMBULATORY_CARE_PROVIDER_SITE_OTHER): Payer: Medicare Other | Admitting: Family Medicine

## 2012-02-24 VITALS — BP 130/72 | HR 78 | Resp 18 | Ht 65.5 in | Wt 256.1 lb

## 2012-02-24 DIAGNOSIS — A630 Anogenital (venereal) warts: Secondary | ICD-10-CM

## 2012-02-24 DIAGNOSIS — R609 Edema, unspecified: Secondary | ICD-10-CM

## 2012-02-24 DIAGNOSIS — I10 Essential (primary) hypertension: Secondary | ICD-10-CM

## 2012-02-24 DIAGNOSIS — E119 Type 2 diabetes mellitus without complications: Secondary | ICD-10-CM

## 2012-02-24 DIAGNOSIS — R21 Rash and other nonspecific skin eruption: Secondary | ICD-10-CM

## 2012-02-24 MED ORDER — LISINOPRIL 20 MG PO TABS: 20.0000 mg | ORAL_TABLET | Freq: Every day | ORAL | Status: DC

## 2012-02-24 MED ORDER — FUROSEMIDE 20 MG PO TABS: 20.0000 mg | ORAL_TABLET | Freq: Every day | ORAL | Status: DC

## 2012-02-24 NOTE — Progress Notes (Signed)
  Subjective:    Patient ID: Julie Clay, female    DOB: 1941-03-16, 71 y.o.   MRN: [id number]  HPI  Patient here to followup leg swelling and rash. She was started on Aldara cream  x3 weeks for concern for genital warts in anogenital region. +itching sensation and mild burning, She states that the areas around the anus are improved she still has a lot of redness and itching in her gluteal cleft. Occasionally she has a mild yellow drainage.  Her leg swelling is much improved. She takes the Lasix in the morning. Review of Systems  GEN- denies fatigue, fever, weight loss,weakness, recent illness HEENT- denies eye drainage, change in vision, nasal discharge, CVS- denies chest pain, palpitations, + leg swelling  RESP- denies SOB, cough, wheeze ABD- denies N/V, change in stools, abd pain GU- denies dysuria, hematuria, dribbling, incontinence       Objective:   Physical Exam GEN- NAD, alert and oriented x3 Rectum- skin tag noted, no bleeding noted,normal tone Skin- erythema up gluteal cleft, very irritated skin, no draining lesion, no warts seen, no large ulcers, no lesions surrounding anus Pulse- Radial 2+  Ext- +pedal edema       Assessment & Plan:

## 2012-02-24 NOTE — Assessment & Plan Note (Signed)
No longer seeing any wartlike lesions or the multiple ulcerations that were surrounding the anal region. I will stop the all day are cream at this time. She is to use Desitin for the skin irritation it does not improve I will send to dermatology for more definitive diagnosis

## 2012-02-24 NOTE — Assessment & Plan Note (Signed)
Improved with lasix 

## 2012-02-24 NOTE — Patient Instructions (Signed)
Stop the cream Try desitin twice a day  Call Monday morning if not improved Start lisinopril once a day for blood pressure  Continue lasix once a day for legs  Stop the maxizide  F/U 3 months

## 2012-02-24 NOTE — Assessment & Plan Note (Signed)
Bp improved with some fluid loss. Will d/c maxzide and continue Lasix, add lisinopril 20mg 

## 2012-02-24 NOTE — Assessment & Plan Note (Signed)
Per above, now with very irritated skin likley from use of aldara, no draining lesions. Trial of desitin

## 2012-02-24 NOTE — Assessment & Plan Note (Signed)
Uncontrolled, recently added Januvia to Metformin and Glipizide

## 2012-03-02 ENCOUNTER — Ambulatory Visit: Payer: Medicare Other | Admitting: Internal Medicine

## 2012-03-24 ENCOUNTER — Ambulatory Visit: Payer: Medicare Other | Admitting: Family Medicine

## 2012-03-29 ENCOUNTER — Ambulatory Visit: Payer: Medicare Other | Admitting: Internal Medicine

## 2012-04-04 DIAGNOSIS — Z961 Presence of intraocular lens: Secondary | ICD-10-CM | POA: Diagnosis not present

## 2012-04-04 DIAGNOSIS — H01009 Unspecified blepharitis unspecified eye, unspecified eyelid: Secondary | ICD-10-CM | POA: Diagnosis not present

## 2012-04-11 ENCOUNTER — Encounter: Payer: Self-pay | Admitting: Internal Medicine

## 2012-04-11 ENCOUNTER — Ambulatory Visit (INDEPENDENT_AMBULATORY_CARE_PROVIDER_SITE_OTHER): Payer: Medicare Other | Admitting: Internal Medicine

## 2012-04-11 VITALS — BP 132/64 | HR 86 | Temp 98.3°F | Ht 65.0 in | Wt 256.0 lb

## 2012-04-11 DIAGNOSIS — R0989 Other specified symptoms and signs involving the circulatory and respiratory systems: Secondary | ICD-10-CM | POA: Diagnosis not present

## 2012-04-11 DIAGNOSIS — R0600 Dyspnea, unspecified: Secondary | ICD-10-CM

## 2012-04-11 DIAGNOSIS — R911 Solitary pulmonary nodule: Secondary | ICD-10-CM | POA: Diagnosis not present

## 2012-04-11 DIAGNOSIS — J45909 Unspecified asthma, uncomplicated: Secondary | ICD-10-CM

## 2012-04-11 DIAGNOSIS — R0609 Other forms of dyspnea: Secondary | ICD-10-CM | POA: Diagnosis not present

## 2012-04-11 LAB — PULMONARY FUNCTION TEST

## 2012-04-11 NOTE — Progress Notes (Signed)
PFT done today. 

## 2012-04-11 NOTE — Assessment & Plan Note (Addendum)
-  PFT's 04/11/2012 FEV1  1.54 (72%) ratio 66 and no better p B2,  DLCO 63 corrects to 143%  All goals of chronic asthma control met including optimal(though not necessarily perfectly nl) function and elimination of symptoms with minimal need for rescue therapy.  I doubt any of her remaining symptoms is asthma related but worth trying the dulera 200 2bid dose for 2 weeks to see what difference it makes in terms of non-specific symptom control and if none then the lower strength would be more appropriate here.  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  The proper method of use, as well as anticipated side effects, of a metered-dose inhaler are discussed and demonstrated to the patient. Improved effectiveness after extensive coaching during this visit to a level of approximately  75%

## 2012-04-11 NOTE — Assessment & Plan Note (Signed)
-   11/15/2011  Walked RA  2 laps @ 185 ft each stopped due to  Sob, no desat, rapid pace  Based on pft results today her ERV is the only "outlier" reflecting the effects of obesity on her lung function and so extra time was spent reviewing this data and recommending how she could place herself in a continuous neg calorie balance.

## 2012-04-11 NOTE — Patient Instructions (Addendum)
Try dulera 200 Take 2 puffs first thing in am and then another 2 puffs about 12 hours later and if better breathing, call refills  Work on inhaler technique:  relax and gently blow all the way out then take a nice smooth deep breath back in, triggering the inhaler at same time you start breathing in.  Hold for up to 5 seconds if you can.  Rinse and gargle with water when done   If your mouth or throat starts to bother you,   I suggest you time the inhaler to your dental care and after using the inhaler(s) brush teeth and tongue with a baking soda containing toothpaste and when you rinse this out, gargle with it first to see if this helps your mouth and throat.     Please schedule a follow up visit in 3 months but call sooner if needed   Weight control is simply a matter of calorie balance which needs to be tilted in your favor by eating less and exercising more.  To get the most out of exercise, you need to be continuously aware that you are short of breath, but never out of breath, for 30 minutes daily. As you improve, it will actually be easier for you to do the same amount of exercise  in  30 minutes so always push to the level where you are short of breath.  If this does not result in gradual weight reduction then I strongly recommend you see a nutritionist with a food diary x 2 weeks so that we can work out a negative calorie balance which is universally effective in steady weight loss programs.  Think of your calorie balance like you do your bank account where in this case you want the balance to go down so you must take in less calories than you burn up.  It's just that simple:  Hard to do, but easy to understand.  Good luck!

## 2012-04-11 NOTE — Progress Notes (Signed)
Subjective:    Patient ID: Julie Clay, female    DOB: 1940/06/15   MRN: [id number]  HPI  36 yowm never smoker with dx of asthma age 71 weeks but did not need daily medications until 1980's with tendency to flares of bronchits fall/winter overall satisfied with control but always sob with exertion x making a bed or walking x 100 ft referred 11/15/2011 to pulmonary clinic by DR Riesa Pope.  11/15/2011 1st pulmonary cc sob x decades with exertion without much variation unless having flare of bronchitis with  min chronic cough. First thing in am uses dulera and albuterol always first..  singulair may have helped last year get over an episode of bronchitis, not clear though she needs to maintain on it.  dulera 100 Take 2 puffs first thing in am and then another 2 puffs about 12 hours later.  Only use your albuterol as a rescue medication(plan B= albuterol hfa/puffer and plan C nebulizer)  to be used if you can't catch your breath by resting or doing a relaxed purse lip breathing pattern. The less you use it, the better it will work when you need it.  GERD diet   02/24/12 lisinopril x one week > asthma attack and gradually improved   04/11/2012 f/u ov/Deondra Wigger cc doe x landing to house x no change in 5  Years (same thing with house work) and had flare of cough and "wheeze"  in [place] late summer rx with cough syrup while maintaining on dulera 100 2 bid with rare need for daytime saba  Sleeping ok without nocturnal  or early am exacerbation  of respiratory  c/o's or need for noct saba. Also denies any obvious fluctuation of symptoms with weather or environmental changes or other aggravating or alleviating factors except as outlined above   ROS  The following are not active complaints unless bolded sore throat, dysphagia, dental problems, itching, sneezing,  nasal congestion or excess/ purulent secretions, ear ache,   fever, chills, sweats, unintended wt loss, pleuritic or exertional cp, hemoptysis,  orthopnea pnd  or leg swelling, presyncope, palpitations, heartburn, abdominal pain, anorexia, nausea, vomiting, diarrhea  or change in bowel or urinary habits, change in stools or urine, dysuria,hematuria,  rash, arthralgias, visual complaints, headache, numbness weakness or ataxia or problems with walking or coordination,  change in mood/affect or memory.             Objective:   Physical Exam  Obese wf with classic pseudowheeze resolves with purse lip maneuver  Wt 256 04/11/2012  Wt Readings from Last 3 Encounters:  11/15/11 261 lb 3.2 oz (118.48 kg)  10/15/11 259 lb 9.6 oz (117.754 kg)  10/08/11 259 lb 1.3 oz (117.518 kg)    HEENT: nl dentition, turbinates, and orophanx. Nl external ear canals without cough reflex   NECK :  without JVD/Nodes/TM/ nl carotid upstrokes bilaterally   LUNGS: no acc muscle use, clear to A and P bilaterally though bs are distant without cough on insp or exp maneuvers   CV:  RRR  no s3 or murmur or increase in P2, no edema   ABD:  Massively obese/ soft and nontender with limited excursion in the supine position. No bruits or organomegaly, bowel sounds nl  MS:  warm without deformities, calf tenderness, cyanosis or clubbing  SKIN: warm and dry without lesions    NEURO:  alert, approp, no deficits    CT chest 06/25/11 Non specific  Tiny nodules and post op changes  Assessment & Plan:

## 2012-04-13 ENCOUNTER — Other Ambulatory Visit: Payer: Self-pay | Admitting: Family Medicine

## 2012-04-17 ENCOUNTER — Ambulatory Visit (HOSPITAL_COMMUNITY): Payer: Medicare Other | Admitting: Oncology

## 2012-04-21 ENCOUNTER — Encounter (HOSPITAL_COMMUNITY): Payer: Self-pay | Admitting: Oncology

## 2012-04-21 ENCOUNTER — Encounter (HOSPITAL_COMMUNITY): Payer: Medicare Other | Attending: Oncology | Admitting: Oncology

## 2012-04-21 VITALS — BP 132/67 | HR 75 | Temp 97.9°F | Resp 18 | Wt 255.3 lb

## 2012-04-21 DIAGNOSIS — C50919 Malignant neoplasm of unspecified site of unspecified female breast: Secondary | ICD-10-CM | POA: Diagnosis not present

## 2012-04-21 DIAGNOSIS — Z170 Estrogen receptor positive status [ER+]: Secondary | ICD-10-CM

## 2012-04-21 NOTE — Patient Instructions (Addendum)
[facility] Discharge Instructions  RECOMMENDATIONS MADE BY THE CONSULTANT AND ANY TEST RESULTS WILL BE SENT TO YOUR REFERRING PHYSICIAN.  EXAM FINDINGS BY THE PHYSICIAN TODAY AND SIGNS OR SYMPTOMS TO REPORT TO CLINIC OR PRIMARY PHYSICIAN: Exam and discussion by MD.  No evidence of recurrence by exam.  Will get you scheduled for Mammogram.  MEDICATIONS PRESCRIBED:  None  INSTRUCTIONS GIVEN AND DISCUSSED: Report any new lumps, bone pain or shortness of breath.  SPECIAL INSTRUCTIONS/FOLLOW-UP: Follow-up in 6 months to see PA.  Thank you for choosing [name] to provide your oncology and hematology care.  To afford each patient quality time with our providers, please arrive at least 15 minutes before your scheduled appointment time.  With your help, our goal is to use those 15 minutes to complete the necessary work-up to ensure our physicians have the information they need to help with your evaluation and healthcare recommendations.    Effective January 1st, 2014, we ask that you re-schedule your appointment with our physicians should you arrive 10 or more minutes late for your appointment.  We strive to give you quality time with our providers, and arriving late affects you and other patients whose appointments are after yours.    Again, thank you for choosing [facility].  Our hope is that these requests will decrease the amount of time that you wait before being seen by our physicians.       _____________________________________________________________  I acknowledge that I have been informed and understand all the instructions given to me and received a copy. I do not have anymore questions at this time but understand that I may call the Cancer Center at [facility] at [phone] during business hours should I have any further questions or need assistance in obtaining follow-up care.

## 2012-04-21 NOTE — Progress Notes (Signed)
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 She declined radiation therapy at the time of presentation in December 2012. She does not have any issues with the Arimidex. She has chronic aching in her joints especially her knees her left foot at the heel and left shoulder none of which are new or worse with the Arimidex. She does not have muscle aching. 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 It is not new, different, or worse. She is accompanied by her husband. She is tolerating the Arimidex quite well. She is not aware of any lumps or bumps anywhere. She has not had colonoscopy and probably more than 10 years but does not want a referral. She has not had mammography scheduled so we will do that in the near future. She states that she had a long talk with Dr. Sherene Sires about her diet but that she refuses to give up her dark chocolate and a couple other foods. Vital signs are stable. Lymph nodes remain negative throughout. She does have tenderness of the left breast especially in both inferior quadrants which incompass the scar area. There are no nodules. The right breast is negative. He also has tenderness in the right upper outer quadrant in particular as walls mild tenderness in the right lower outer quadrant. Her lungs are clear today. Heart shows a regular rhythm and rate without  murmur rub or gallop. The stethoscope created tenderness over her heart area in the area of the portion of the breast which has had surgery. Abdomen remains massively obese without obvious masses. Bowel sounds are diminished. She does have 1+ pitting pretibial edema. Skin exam is benign. She is alert and oriented. I do suspect however that she is a very unhappy woman. I am not sure why but I am worried that she is mildly depressed.  I do not need blood work today but we will set up mammography, see her back in 6 months and continue the Arimidex

## 2012-04-28 ENCOUNTER — Other Ambulatory Visit: Payer: Self-pay | Admitting: Family Medicine

## 2012-04-28 ENCOUNTER — Encounter: Payer: Self-pay | Admitting: Family Medicine

## 2012-04-28 ENCOUNTER — Ambulatory Visit (INDEPENDENT_AMBULATORY_CARE_PROVIDER_SITE_OTHER): Payer: Medicare Other | Admitting: Family Medicine

## 2012-04-28 ENCOUNTER — Telehealth: Payer: Self-pay | Admitting: Family Medicine

## 2012-04-28 VITALS — BP 132/80 | HR 98 | Resp 18 | Ht 65.5 in | Wt 259.1 lb

## 2012-04-28 DIAGNOSIS — E119 Type 2 diabetes mellitus without complications: Secondary | ICD-10-CM

## 2012-04-28 DIAGNOSIS — I10 Essential (primary) hypertension: Secondary | ICD-10-CM

## 2012-04-28 DIAGNOSIS — J209 Acute bronchitis, unspecified: Secondary | ICD-10-CM | POA: Insufficient documentation

## 2012-04-28 DIAGNOSIS — J45909 Unspecified asthma, uncomplicated: Secondary | ICD-10-CM

## 2012-04-28 MED ORDER — BENZONATATE 100 MG PO CAPS: 100.0000 mg | ORAL_CAPSULE | Freq: Four times a day (QID) | ORAL | Status: DC | PRN

## 2012-04-28 MED ORDER — GUAIFENESIN-CODEINE 100-10 MG/5ML PO SYRP: 5.0000 mL | ORAL_SOLUTION | Freq: Two times a day (BID) | ORAL | Status: DC | PRN

## 2012-04-28 MED ORDER — SULFAMETHOXAZOLE-TRIMETHOPRIM 800-160 MG PO TABS: 1.0000 | ORAL_TABLET | Freq: Two times a day (BID) | ORAL | Status: DC

## 2012-04-28 NOTE — Telephone Encounter (Signed)
Wheezing all night, she has asthma and bronchitis. Coughing up some yellow phlegm. Using robitussin during the day and has some codeine cough med for night.  Has been going on for 2 days, a lot worse today. Advised with no openings she try urgent care but she said she would rather not and wanted to see is something could be sent in since her Dr was out of office

## 2012-04-28 NOTE — Telephone Encounter (Signed)
Patient aware.

## 2012-04-28 NOTE — Patient Instructions (Addendum)
F/U with Dr Jeanice Lim as before.  Please call if condition is not improving.  You are treated for acute bronchitis, and medications (3) are sent to your pharmacy as discussed

## 2012-04-28 NOTE — Telephone Encounter (Signed)
Work in asap please

## 2012-04-28 NOTE — Progress Notes (Signed)
  Subjective:    Patient ID: Julie Clay, female    DOB: [birth year], 72 y.o.   MRN: [id number]  HPI  2 day h/o cough with chest congestion and yellow sputum , no documented fever or chills. No xcess wheezing noted, hesitant to take steroids due to diabetes, and c/o localized left chest wall pain with excessive cough which is reproducible. No extra neb treatments or chest tightness noted. Has cough worse at night, has used codeine suppressant syrup at night and requests a refill of same Denies sinus pressure, sore throat or ear pain Review of Systems See HPI  Denies chest pains, palpitations and leg swelling Denies abdominal pain, nausea, vomiting,diarrhea or constipation.   Denies dysuria, frequency, hesitancy or incontinence.  Denies headaches, seizures, numbness, or tingling. Denies uncontrolled  depression, anxiety or insomnia. States blood sugars are uncontrolled, but denies polyuria, polydipsia or blurred vision        Objective:   Physical Exam Patient alert and oriented and in no cardiopulmonary distress.  HEENT: No facial asymmetry, EOMI, no sinus tenderness,  oropharynx pink and moist.  Neck supple no adenopathy.TM clear  Chest: decreased though adequate air entry. Few high pitched wheezes bilaterally,scattered crackles, reproducible left chest wall tenderness over CC junctions 3 and 4 on left  CVS: S1, S2 no murmurs, no S3.  ABD: Soft non tender. Bowel sounds normal.  Ext: No edema  Psych: Good eye contact, normal affect. Memory intact not anxious or depressed appearing.  CNS: CN 2-12 intact, power,normal throughout.        Assessment & Plan:

## 2012-04-29 NOTE — Assessment & Plan Note (Signed)
Controlled, no change in medication  

## 2012-04-29 NOTE — Assessment & Plan Note (Signed)
No acute flare at this time, Meds as before. No steroid burst prescribed based on history and current exam Pt to call if deteriorates

## 2012-04-29 NOTE — Assessment & Plan Note (Signed)
Uncontrolled 

## 2012-04-29 NOTE — Assessment & Plan Note (Signed)
Aggressive treatment with antibiotics and decongestants. Refill written x 1 for cough suppressant. Pt has chest wall tenderness due to excessive cough

## 2012-05-02 ENCOUNTER — Telehealth: Payer: Self-pay | Admitting: Orthopedic Surgery

## 2012-05-02 NOTE — Telephone Encounter (Signed)
In July we received a referral to see Julie Clay for knee pain.  Patient had been treated previously and was going to request records for Dr. Romeo Apple to review. Patient has been contacted several times and a letter was sent  To her12/19/13 and as of yet we have not heard anything from the patient. Just wanted to update you on the status of the referral

## 2012-05-03 ENCOUNTER — Other Ambulatory Visit: Payer: Self-pay | Admitting: Family Medicine

## 2012-05-05 ENCOUNTER — Encounter: Payer: Self-pay | Admitting: Internal Medicine

## 2012-05-08 ENCOUNTER — Ambulatory Visit (HOSPITAL_COMMUNITY)
Admission: RE | Admit: 2012-05-08 | Discharge: 2012-05-08 | Disposition: A | Discharge status: Home / Self Care | Payer: Medicare Other | Source: Ambulatory Visit | Attending: Family Medicine | Admitting: Family Medicine

## 2012-05-08 ENCOUNTER — Telehealth: Payer: Self-pay | Admitting: Family Medicine

## 2012-05-08 DIAGNOSIS — J40 Bronchitis, not specified as acute or chronic: Secondary | ICD-10-CM | POA: Diagnosis not present

## 2012-05-08 DIAGNOSIS — R059 Cough, unspecified: Secondary | ICD-10-CM | POA: Diagnosis not present

## 2012-05-08 DIAGNOSIS — R05 Cough: Secondary | ICD-10-CM | POA: Insufficient documentation

## 2012-05-08 MED ORDER — AMOXICILLIN-POT CLAVULANATE 875-125 MG PO TABS: 1.0000 | ORAL_TABLET | Freq: Two times a day (BID) | ORAL | Status: AC

## 2012-05-08 NOTE — Telephone Encounter (Signed)
Please get Chest xray done New antibiotic sent Mucinex DM

## 2012-05-08 NOTE — Telephone Encounter (Signed)
Patient aware.

## 2012-05-08 NOTE — Telephone Encounter (Signed)
Still coughing and bringing up yellow green phlegm, still feels bad. Finished all her antibiotic and still not better. Wants to know what to do.

## 2012-05-09 DIAGNOSIS — M79609 Pain in unspecified limb: Secondary | ICD-10-CM | POA: Diagnosis not present

## 2012-05-09 DIAGNOSIS — Q828 Other specified congenital malformations of skin: Secondary | ICD-10-CM | POA: Diagnosis not present

## 2012-05-22 ENCOUNTER — Telehealth: Payer: Self-pay | Admitting: Family Medicine

## 2012-05-22 NOTE — Telephone Encounter (Signed)
Please discuss with pt,  See if she has appt for the mammogram No PT referral was placed by pulmonary

## 2012-05-23 NOTE — Telephone Encounter (Signed)
appt for mammo has been scheduled per radiology. Spoke with her husband and she is out of town. [facility] and they are to be faxing ortho records

## 2012-05-31 ENCOUNTER — Encounter (HOSPITAL_COMMUNITY): Payer: Medicare Other

## 2012-06-04 ENCOUNTER — Other Ambulatory Visit: Payer: Self-pay | Admitting: Family Medicine

## 2012-06-05 ENCOUNTER — Telehealth: Payer: Self-pay | Admitting: Family Medicine

## 2012-06-07 ENCOUNTER — Ambulatory Visit (HOSPITAL_COMMUNITY)
Admission: RE | Admit: 2012-06-07 | Discharge: 2012-06-07 | Disposition: A | Discharge status: Home / Self Care | Payer: Medicare Other | Source: Ambulatory Visit | Attending: Oncology | Admitting: Oncology

## 2012-06-07 ENCOUNTER — Other Ambulatory Visit: Payer: Self-pay | Admitting: Family Medicine

## 2012-06-07 DIAGNOSIS — C50919 Malignant neoplasm of unspecified site of unspecified female breast: Secondary | ICD-10-CM | POA: Diagnosis not present

## 2012-06-07 DIAGNOSIS — Z853 Personal history of malignant neoplasm of breast: Secondary | ICD-10-CM | POA: Diagnosis not present

## 2012-06-14 NOTE — Telephone Encounter (Signed)
Patient has appointment scheduled for 2/27

## 2012-06-15 ENCOUNTER — Other Ambulatory Visit: Payer: Self-pay | Admitting: Family Medicine

## 2012-06-15 ENCOUNTER — Encounter: Payer: Self-pay | Admitting: Family Medicine

## 2012-06-15 ENCOUNTER — Ambulatory Visit (INDEPENDENT_AMBULATORY_CARE_PROVIDER_SITE_OTHER): Payer: Medicare Other | Admitting: Family Medicine

## 2012-06-15 VITALS — BP 134/72 | HR 96 | Resp 18 | Ht 65.5 in | Wt 256.1 lb

## 2012-06-15 DIAGNOSIS — E119 Type 2 diabetes mellitus without complications: Secondary | ICD-10-CM | POA: Diagnosis not present

## 2012-06-15 DIAGNOSIS — R609 Edema, unspecified: Secondary | ICD-10-CM | POA: Diagnosis not present

## 2012-06-15 DIAGNOSIS — R21 Rash and other nonspecific skin eruption: Secondary | ICD-10-CM

## 2012-06-15 DIAGNOSIS — M25519 Pain in unspecified shoulder: Secondary | ICD-10-CM

## 2012-06-15 DIAGNOSIS — I10 Essential (primary) hypertension: Secondary | ICD-10-CM | POA: Diagnosis not present

## 2012-06-15 DIAGNOSIS — E785 Hyperlipidemia, unspecified: Secondary | ICD-10-CM

## 2012-06-15 DIAGNOSIS — M25512 Pain in left shoulder: Secondary | ICD-10-CM

## 2012-06-15 MED ORDER — HYDROCODONE-ACETAMINOPHEN 5-325 MG PO TABS: ORAL_TABLET | ORAL | Status: DC
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
## 2012-06-15 NOTE — Patient Instructions (Addendum)
Continue current medications Take 1 tablet of maxzide daily Take your blood sugar fasting Dermatology referral Vicodin at bedtime for shoulder pain Get the labs done fasting, we will call with results F/U 3 months

## 2012-06-16 DIAGNOSIS — M25519 Pain in unspecified shoulder: Secondary | ICD-10-CM | POA: Insufficient documentation

## 2012-06-16 NOTE — Assessment & Plan Note (Signed)
She has recurrent rash in her gluteal cleft she is using Desitin which helps with the irritation there no signs of warts at this time is. Of note she was on out there which improved ulcerations and a few warts that she had back in November of 2013, will refer her to dermatology due to  recurrence

## 2012-06-16 NOTE — Assessment & Plan Note (Addendum)
Her last A1c was 7.7% showed uncontrolled state. She is reluctant at times to change any of her medications states she is doing well. I will repeat her A1c may be able to increase the Januvia She is seeing the eye doctor she declines return to the foot Dr.

## 2012-06-16 NOTE — Assessment & Plan Note (Signed)
Chronic shoulder pain with acute worsening. His appointment with orthopedic surgery. I will give her a short course of hydrocodone to take at bedtime for pain

## 2012-06-16 NOTE — Assessment & Plan Note (Signed)
Her blood pressure is elevated some unfortunately she was unable to tolerate the lisinopril she's restarted Maxzide she's to take the entire tablet daily

## 2012-06-16 NOTE — Assessment & Plan Note (Signed)
Very little edema at this point since he has stopped the Lasix advise her to take the entire tablet of Maxzide to give her the full potential of the hydrochlorothiazide diuretic

## 2012-06-21 ENCOUNTER — Ambulatory Visit (INDEPENDENT_AMBULATORY_CARE_PROVIDER_SITE_OTHER): Payer: Medicare Other | Admitting: Orthopedic Surgery

## 2012-06-21 ENCOUNTER — Ambulatory Visit (INDEPENDENT_AMBULATORY_CARE_PROVIDER_SITE_OTHER): Payer: Medicare Other

## 2012-06-21 ENCOUNTER — Encounter: Payer: Self-pay | Admitting: Orthopedic Surgery

## 2012-06-21 VITALS — BP 150/70 | Ht 65.5 in | Wt 254.0 lb

## 2012-06-21 DIAGNOSIS — M67919 Unspecified disorder of synovium and tendon, unspecified shoulder: Secondary | ICD-10-CM

## 2012-06-21 DIAGNOSIS — M25519 Pain in unspecified shoulder: Secondary | ICD-10-CM | POA: Diagnosis not present

## 2012-06-21 DIAGNOSIS — M719 Bursopathy, unspecified: Secondary | ICD-10-CM

## 2012-06-21 DIAGNOSIS — M25512 Pain in left shoulder: Secondary | ICD-10-CM

## 2012-06-21 DIAGNOSIS — M75102 Unspecified rotator cuff tear or rupture of left shoulder, not specified as traumatic: Secondary | ICD-10-CM | POA: Insufficient documentation

## 2012-06-21 NOTE — Progress Notes (Signed)
Patient ID: Julie Clay, female   DOB: [birth year], 72 y.o.   MRN: [id number] Chief Complaint  Patient presents with  . Shoulder Pain    Left shoulder pain referred by Dr.[place]    This patient comes to [place] with the chief complaint of pain in her left shoulder and arm.  The patient has been treated with several cortisone injections, Naprosyn, for left shoulder rotator cuff syndrome.  She complains of sharp dull throbbing pain which increased when she fell backwards about 6 weeks ago. She is now having difficulty moving her arm behind her, difficult with forward elevation and activities requiring flexion of the shoulder. She reports a catching or locking sensation in the shoulder and has pain with deceleration of the shoulder.  Her review of systems includes fever chills fatigue shortness of breath wheezing redness of the eyes heartburn joint pain skin rash easy bruising excessive thirst temperature intolerance adverse food reactions  Denies numbness tingling nervousness anxiety frequency chest pain  She has a history of asthma and diabetes  BP 150/70  Ht 5' 5.5" (1.664 m)  Wt 254 lb (115.214 kg)  BMI 41.61 kg/m2  She is obese normal development grooming and hygiene She is oriented x3 Her mood is normal She ambulates but with an altered gait  Right shoulder no swelling no tenderness full range of motion. Apprehension negative grade strength motor 5 skin normal normal pulse normal sensation  Left shoulder tenderness around the shoulder and deltoid skin normal pulse normal sensation normal stability normal passive range of motion 150 active range of motion 120, impingement sign positive. Painful ARC of motion 90-120 through 150. Stability tests were normal  X-ray was ordered and my interpretation is that she has no glenohumeral arthritis may be some a.c. joint arthrosis decreased acromiohumeral head distance consistent with rotator cuff disease  Rotator cuff syndrome of left shoulder  - Plan: Ambulatory referral to Occupational Therapy  Pain in joint, shoulder region, left - Plan: DG Shoulder Left, Ambulatory referral to Occupational Therapy   Inject left shoulder subacromial space  Subacromial Shoulder Injection Procedure Note  Pre-operative Diagnosis: left RC Syndrome  Post-operative Diagnosis: same  Indications: pain   Anesthesia: ethyl chloride   Procedure Details   Verbal consent was obtained for the procedure. The shoulder was prepped withalcohol and the skin was anesthetized. A 20 gauge needle was advanced into the subacromial space through posterior approach without difficulty  The space was then injected with 3 ml 1% lidocaine and 1 ml of depomedrol. The injection site was cleansed with isopropyl alcohol and a dressing was applied.  Complications:  None; patient tolerated the procedure well.

## 2012-06-21 NOTE — Patient Instructions (Signed)
You have received a steroid shot. 15% of patients experience increased pain at the injection site with in the next 24 hours. This is best treated with ice and tylenol extra strength 2 tabs every 8 hours. If you are still having pain please call the office.   Impingement Syndrome, Rotator Cuff, Bursitis with Rehab Impingement syndrome is a condition that involves inflammation of the tendons of the rotator cuff and the subacromial bursa, that causes pain in the shoulder. The rotator cuff consists of four tendons and muscles that control much of the shoulder and upper arm function. The subacromial bursa is a fluid filled sac that helps reduce friction between the rotator cuff and one of the bones of the shoulder (acromion). Impingement syndrome is usually an overuse injury that causes swelling of the bursa (bursitis), swelling of the tendon (tendonitis), and/or a tear of the tendon (strain). Strains are classified into three categories. Grade 1 strains cause pain, but the tendon is not lengthened. Grade 2 strains include a lengthened ligament, due to the ligament being stretched or partially ruptured. With grade 2 strains there is still function, although the function may be decreased. Grade 3 strains include a complete tear of the tendon or muscle, and function is usually impaired. SYMPTOMS    Pain around the shoulder, often at the outer portion of the upper arm.   Pain that gets worse with shoulder function, especially when reaching overhead or lifting.   Sometimes, aching when not using the arm.   Pain that wakes you up at night.   Sometimes, tenderness, swelling, warmth, or redness over the affected area.   Loss of strength.   Limited motion of the shoulder, especially reaching behind the back (to the back pocket or to unhook bra) or across your body.   Crackling sound (crepitation) when moving the arm.   Biceps tendon pain and inflammation (in the front of the shoulder). Worse when bending  the elbow or lifting.  CAUSES   Impingement syndrome is often an overuse injury, in which chronic (repetitive) motions cause the tendons or bursa to become inflamed. A strain occurs when a force is paced on the tendon or muscle that is greater than it can withstand. Common mechanisms of injury include: Stress from sudden increase in duration, frequency, or intensity of training.  Direct hit (trauma) to the shoulder.   Aging, erosion of the tendon with normal use.   Bony bump on shoulder (acromial spur).  RISK INCREASES WITH:  Contact sports (football, wrestling, boxing).   Throwing sports (baseball, tennis, volleyball).   Weightlifting and bodybuilding.   Heavy labor.   Previous injury to the rotator cuff, including impingement.   Poor shoulder strength and flexibility.   Failure to warm up properly before activity.   Inadequate protective equipment.   Old age.   Bony bump on shoulder (acromial spur).  PREVENTION    Warm up and stretch properly before activity.   Allow for adequate recovery between workouts.   Maintain physical fitness:   Strength, flexibility, and endurance.   Cardiovascular fitness.   Learn and use proper exercise technique.  PROGNOSIS   If treated properly, impingement syndrome usually goes away within 6 weeks. Sometimes surgery is required.   RELATED COMPLICATIONS    Longer healing time if not properly treated, or if not given enough time to heal.   Recurring symptoms, that result in a chronic condition.   Shoulder stiffness, frozen shoulder, or loss of motion.   Rotator cuff tendon tear.     Recurring symptoms, especially if activity is resumed too soon, with overuse, with a direct blow, or when using poor technique.  TREATMENT   Treatment first involves the use of ice and medicine, to reduce pain and inflammation. The use of strengthening and stretching exercises may help reduce pain with activity. These exercises may be performed at home  or with a therapist. If non-surgical treatment is unsuccessful after more than 6 months, surgery may be advised. After surgery and rehabilitation, activity is usually possible in 3 months.   MEDICATION  If pain medicine is needed, nonsteroidal anti-inflammatory medicines (aspirin and ibuprofen), or other minor pain relievers (acetaminophen), are often advised.   Do not take pain medicine for 7 days before surgery.   Prescription pain relievers may be given, if your caregiver thinks they are needed. Use only as directed and only as much as you need.   Corticosteroid injections may be given by your caregiver. These injections should be reserved for the most serious cases, because they may only be given a certain number of times.  HEAT AND COLD  Cold treatment (icing) should be applied for 10 to 15 minutes every 2 to 3 hours for inflammation and pain, and immediately after activity that aggravates your symptoms. Use ice packs or an ice massage.   Heat treatment may be used before performing stretching and strengthening activities prescribed by your caregiver, physical therapist, or athletic trainer. Use a heat pack or a warm water soak.  SEEK MEDICAL CARE IF:    Symptoms get worse or do not improve in 4 to 6 weeks, despite treatment.   New, unexplained symptoms develop. (Drugs used in treatment may produce side effects.)   

## 2012-06-22 DIAGNOSIS — E785 Hyperlipidemia, unspecified: Secondary | ICD-10-CM | POA: Diagnosis not present

## 2012-06-22 DIAGNOSIS — E119 Type 2 diabetes mellitus without complications: Secondary | ICD-10-CM | POA: Diagnosis not present

## 2012-06-22 LAB — HEPATIC FUNCTION PANEL
ALT: 29 U/L (ref 0–35)
AST: 21 U/L (ref 0–37)
Albumin: 4 g/dL (ref 3.5–5.2)
Alkaline Phosphatase: 55 U/L (ref 39–117)
Total Bilirubin: 0.4 mg/dL (ref 0.3–1.2)

## 2012-06-22 LAB — CBC
HCT: 36.9 % (ref 36.0–46.0)
MCHC: 32.5 g/dL (ref 30.0–36.0)
MCV: 79.5 fL (ref 78.0–100.0)
Platelets: 260 10*3/uL (ref 150–400)
RDW: 15.3 % (ref 11.5–15.5)
WBC: 9.2 10*3/uL (ref 4.0–10.5)

## 2012-06-22 LAB — LIPID PANEL
HDL: 38 mg/dL — ABNORMAL LOW (ref >39)
LDL Cholesterol: 80 mg/dL (ref 0–99)
Triglycerides: 180 mg/dL — ABNORMAL HIGH (ref <150)

## 2012-06-22 LAB — BASIC METABOLIC PANEL
CO2: 28 mEq/L (ref 19–32)
Calcium: 10.1 mg/dL (ref 8.4–10.5)
Chloride: 102 mEq/L (ref 96–112)
Creat: 1 mg/dL (ref 0.50–1.10)
Glucose, Bld: 115 mg/dL — ABNORMAL HIGH (ref 70–99)

## 2012-06-26 ENCOUNTER — Ambulatory Visit (HOSPITAL_COMMUNITY)
Admission: RE | Admit: 2012-06-26 | Discharge: 2012-06-26 | Disposition: A | Discharge status: Home / Self Care | Payer: Medicare Other | Source: Ambulatory Visit | Attending: Orthopedic Surgery | Admitting: Orthopedic Surgery

## 2012-06-26 DIAGNOSIS — I10 Essential (primary) hypertension: Secondary | ICD-10-CM | POA: Diagnosis not present

## 2012-06-26 DIAGNOSIS — IMO0001 Reserved for inherently not codable concepts without codable children: Secondary | ICD-10-CM | POA: Insufficient documentation

## 2012-06-26 DIAGNOSIS — M6281 Muscle weakness (generalized): Secondary | ICD-10-CM | POA: Diagnosis not present

## 2012-06-26 DIAGNOSIS — E119 Type 2 diabetes mellitus without complications: Secondary | ICD-10-CM | POA: Diagnosis not present

## 2012-06-26 NOTE — Evaluation (Signed)
Occupational Therapy Evaluation  Patient Details  Name: Julie Clay MRN: [id number] Date of Birth: 01/30/1941  Today's Date: 06/26/2012 Time: 1478-2956 OT Time Calculation (min): 35 min OT Evaluation 35' Visit#: 1 of 12  Re-eval: 07/24/12  Assessment Diagnosis: left rotator cuff syndrome Next MD Visit: may 2014 Prior Therapy: n/a  Authorization: Medicare  Authorization Time Period: before 10th visit  Authorization Visit#: 1 of 10   Past Medical History:  Past Medical History  Diagnosis Date  . Hypertension   . Hyperlipidemia   . Diabetes mellitus   . Asthma     spirometry (2011)- mild ventilary defect  . Cancer 2012    Breast  . Arthritis     Osteoarthritis  . Fatty liver 2013    enlarged  . Sliding hiatal hernia   . Ventral hernia, unspecified, without mention of obstruction or gangrene   . Lung nodule seen on imaging study 2013  . HSV (herpes simplex virus) infection    Past Surgical History:  Past Surgical History  Procedure Laterality Date  . Cesarean section    . 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 Breast surgery  jan 2013  . Hand ligament reconstruction    . Cataract extraction, bilateral      on different occassions     Subjective S:  I have been having problems with my left shoulder for years, but its gotten really bad the past month or so. Pertinent History: Julie Clay states that she has experienced pain and decreased mobility in her left shoulder for several years.  During the past month, her symptoms have gotten worse.  She feels the increased pain and decreased mobility could be attributed to tension from a road trip in bad weather, jerking her arm back after she was bit by a dog, or flailing her arms to prevent losing her balance.  She consulted with Dr. Romeo Apple and had an xray and a cortisone injection.  She states that the cortisone injection did not alleviate her symptoms as it has in the past.  Prior to having an MRI, she has been  referred to occupational therapy for evaluation and treatment.   Limitations: n/a Special Tests: UEFI scored 40/72 = 55% I level with LUE use  Patient Stated Goals: I honestly want to have the MRI and surgery so that my arm will stop hurting. Pain Assessment Currently in Pain?: Yes Pain Score:   5 Pain Location: Shoulder Pain Orientation: Left Pain Type: Acute pain Pain Radiating Towards: to deltoid tuberosity and trapezius  Precautions/Restrictions  Precautions Precautions: None Restrictions Weight Bearing Restrictions: No Balance Screen  Has the patient fallen in the past 6 months No  Has the patient had a decrease in activity level because of a fear of falling?  Yes (Patient is fearful of weakness in knees and poor activity tolerance will seek a PT referral from PCP)  Is the patient reluctant to leave their home because of a fear of falling?  No   Prior Functioning  Home Living Lives With: Spouse Prior Function Level of Independence: Independent with basic ADLs Driving: Yes Vocation: Retired Leisure: Hobbies-yes (Comment) Comments: enjoys crocheting, sewing, reading  Assessment ADL/Vision/Perception ADL ADL Comments: Julie Clay is not able to tuck a shirt in, complete bathroom hygiene, or get comfortable to sleep at night due to increased pain and decreased mobility in her left shoulder.   Dominant Hand: Right Vision - History Baseline Vision: No visual deficits  Cognition/Observation Cognition Overall  Cognitive Status: Appears within functional limits for tasks assessed Observation/Other Assessments Observations: Julie Clay avoids using her left arm to assist with bed mobility or transfers  Sensation/Coordination/Edema Sensation Light Touch: Appears Intact Coordination Gross Motor Movements are Fluid and Coordinated: Yes Fine Motor Movements are Fluid and Coordinated: Yes  Additional Assessments LUE AROM (degrees) LUE Overall AROM Comments: assessed in  seated Left Shoulder Flexion: 130 Degrees Left Shoulder ABduction: 60 Degrees Left Shoulder Internal Rotation: 75 Degrees Left Shoulder External Rotation: 50 Degrees LUE PROM (degrees) LUE Overall PROM Comments: assessed in supine WFL  LUE Strength LUE Overall Strength Comments: assessed in seated, within given range Left Shoulder Flexion:  (4-/5) Left Shoulder ABduction: 4/5 Left Shoulder Internal Rotation: 4/5 Left Shoulder External Rotation: 4/5 Palpation Palpation: moderate fascial restrictions noted in her left shoulder, upper arm, scapular, and trapezius region.       Exercise/Treatments    Manual Therapy Manual Therapy: Myofascial release Myofascial Release: MFR and manual stretching to assess restrictions in left shoulder region, which are moderate.  Patient very tender to palpation in upper arm, scapular, and shoulder region  Occupational Therapy Assessment and Plan OT Assessment and Plan Clinical Impression Statement: A:  Patient is a 72 year old female with decreased functional use of her LUE due to increased pain and restrictions and decreased AROM and strength.  Patient will benefit from skilled OT intervention to increase functional use of LUE.   Pt will benefit from skilled therapeutic intervention in order to improve on the following deficits: Decreased activity tolerance;Decreased range of motion;Decreased strength;Increased muscle spasms;Increased fascial restricitons;Pain Rehab Potential: Good OT Frequency: Min 2X/week OT Duration: 6 weeks OT Treatment/Interventions: Self-care/ADL training;Therapeutic exercise;Energy conservation;Manual therapy;Modalities;Patient/family education;Therapeutic activities OT Plan: P: Skilled OT intervention to increase pain free mobility and decrease restrictions in left shoulder region for greater independence with daily activities.  Treatment Plan:  MFR and manual stretching in supine.  Therapeutic Exercises:  PROM, AAROM and progress  to AROM, ball stretches, thumb tacks, prot/ret//elev/dep.  pulleys, progress as tolerated.     Goals Short Term Goals Time to Complete Short Term Goals: 3 weeks Short Term Goal 1: Patient will be educated on a HEP. Short Term Goal 2: Patient will increase AROM by 15 degrees in her left shoulder for greater ease with tucking her shirts in. Short Term Goal 3: Patient will increase her left shoulder strength to 4+/5 for increased ability to lift pots and pans when cooking.  Short Term Goal 4: Patient will decrease fascial restrictions from moderate to minimal-moderate for greater mobility needed for personal hygiene completion. Short Term Goal 5: Patient will decrease pain to 3/10 in her left shoulder for increased abilty to sleep at night.  Long Term Goals Time to Complete Long Term Goals: 6 weeks Long Term Goal 1: Patient will return to prior level of independence with all functional and desired leisure activities.  Long Term Goal 2: Patient will increase left shoulder AROM to WNL for increased abilty to complete personal hygiene. Long Term Goal 3: Patient will increase left shoulder strength to 5/5 for increased ability to lift pots and pans when cooking.  Long Term Goal 4: Patient will decrease fascial restrictions to minimal in her left shoulder region for greater mobility required to pull up pants.  Long Term Goal 5: Patient will decrease pain in her left shoulder region to 1/10 when completing desired daily activities.   Problem List Patient Active Problem List  Diagnosis  . Breast cancer  . Essential hypertension,  benign  . DM (diabetes mellitus)  . OA (osteoarthritis)  . Morbidly obese  . Asthma  . Hyperlipidemia  . Chronic fatigue  . OSA (obstructive sleep apnea)  . Dyspnea  . HSV (herpes simplex virus) infection  . Otalgia  . Anal warts  . Peripheral edema  . Skin rash  . Bronchitis, acute  . Pain in joint, shoulder region  . Rotator cuff syndrome of left shoulder  .  Muscle weakness (generalized)    End of Session Activity Tolerance: Patient tolerated treatment well General Behavior During Session: [facility] for tasks performed Cognition: [facility] for tasks performed OT Plan of Care OT Home Exercise Plan: Educated patient on HEP for towel slides and shoulder stretches.   GO Functional Assessment Tool Used: UEFI scored 40/72 = 55% Independence level Functional Limitation: Carrying, moving and handling objects Carrying, Moving and Handling Objects Current Status ([id number]): At least 40 percent but less than 60 percent impaired, limited or restricted Carrying, Moving and Handling Objects Goal Status [phone]): At least 1 percent but less than 20 percent impaired, limited or restricted  Shirlean Mylar, OTR/L  06/26/2012, 4:57 PM  Physician Documentation Your signature is required to indicate approval of the treatment plan as stated above.  Please sign and either send electronically or make a copy of this report for your files and return this physician signed original.  Please mark one 1.__approve of plan  2. ___approve of plan with the following conditions.   ______________________________                                                          _____________________ Physician Signature                                                                                                             Date

## 2012-06-27 ENCOUNTER — Other Ambulatory Visit: Payer: Self-pay

## 2012-06-27 ENCOUNTER — Telehealth: Payer: Self-pay | Admitting: Family Medicine

## 2012-06-27 DIAGNOSIS — M75102 Unspecified rotator cuff tear or rupture of left shoulder, not specified as traumatic: Secondary | ICD-10-CM

## 2012-06-27 MED ORDER — HYDROCODONE-ACETAMINOPHEN 5-325 MG PO TABS: ORAL_TABLET | ORAL | Status: DC

## 2012-06-27 MED ORDER — TRIAMTERENE-HCTZ 75-50 MG PO TABS: 1.0000 | ORAL_TABLET | Freq: Every day | ORAL | Status: DC

## 2012-06-27 NOTE — Telephone Encounter (Signed)
Okay to refill  Pain meds Will send for 2nd opinion at pt request for rotator cuff  Physical therapy will be signed off

## 2012-06-27 NOTE — Telephone Encounter (Signed)
Would like 2nd referral to ortho- would like to go to [facility].

## 2012-06-27 NOTE — Telephone Encounter (Signed)
Please give labs results Let her know the therapist contacted me, and I will sign off on the extra physical therapy

## 2012-06-29 ENCOUNTER — Ambulatory Visit (HOSPITAL_COMMUNITY)
Admission: RE | Admit: 2012-06-29 | Discharge: 2012-06-29 | Disposition: A | Discharge status: Home / Self Care | Payer: Medicare Other | Source: Ambulatory Visit | Attending: Orthopedic Surgery | Admitting: Orthopedic Surgery

## 2012-06-29 DIAGNOSIS — I10 Essential (primary) hypertension: Secondary | ICD-10-CM | POA: Diagnosis not present

## 2012-06-29 DIAGNOSIS — L538 Other specified erythematous conditions: Secondary | ICD-10-CM | POA: Diagnosis not present

## 2012-06-29 NOTE — Progress Notes (Signed)
Occupational Therapy Treatment Patient Details  Name: Julie Clay MRN: [id number] Date of Birth: 1940/06/13  Today's Date: 06/29/2012 Time: 8657-8469 OT Time Calculation (min): 36 min Manual Therapy 1439-1500 21' Therapeutic Exercises [phone] 15' Visit#: 2 of 12  Re-eval: 07/24/12    Authorization: Medicare  Authorization Time Period: before 10th visit  Authorization Visit#: 2 of 10  Subjective S:  I tried all of the exercises and the table top exercise where you make the arc is the most painful. Pain Assessment Currently in Pain?: Yes Pain Score:   5 Pain Location: Shoulder Pain Orientation: Left Pain Type: Acute pain  Precautions/Restrictions   progress as tolerated  Exercise/Treatments Supine Protraction: PROM;AAROM;10 reps Horizontal ABduction: PROM;AAROM;10 reps External Rotation: PROM;AAROM;10 reps Internal Rotation: PROM;AAROM;10 reps Flexion: PROM;AAROM;10 reps ABduction: PROM;AAROM;10 reps Seated Elevation: AROM;10 reps Extension: AROM;10 reps Row: AROM;10 reps Therapy Ball Flexion: 15 reps ABduction: 15 reps ROM / Strengthening / Isometric Strengthening Wall Wash: 1' Thumb Tacks: attempted 1 minute and could only complete 20 seconds Prot/Ret//Elev/Dep: attempted 1 minute, and could only complete 45 seconds      Manual Therapy Manual Therapy: Myofascial release Myofascial Release: Myofascial release and manual stretching to left upper arm, scapular, lateral upper arm, trapezius region to decrease pain and fascial restrictions and increase pain free mobility in left shoulder.    Occupational Therapy Assessment and Plan OT Assessment and Plan Clinical Impression Statement: A:  Patient had significant fascial restrictions in her anterior and lateral upper arm.  She is able to complete AAROM in supine through full range with pain, in order to stay pain free, her AAROM is limited to 50%. OT Plan: P:  Add pulleys for increased AAROM.     Goals Short  Term Goals Time to Complete Short Term Goals: 3 weeks Short Term Goal 1: Patient will be educated on a HEP. Short Term Goal 1 Progress: Progressing toward goal Short Term Goal 2: Patient will increase AROM by 15 degrees in her left shoulder for greater ease with tucking her shirts in. Short Term Goal 2 Progress: Progressing toward goal Short Term Goal 3: Patient will increase her left shoulder strength to 4+/5 for increased ability to lift pots and pans when cooking.  Short Term Goal 3 Progress: Progressing toward goal Short Term Goal 4: Patient will decrease fascial restrictions from moderate to minimal-moderate for greater mobility needed for personal hygiene completion. Short Term Goal 4 Progress: Progressing toward goal Short Term Goal 5: Patient will decrease pain to 3/10 in her left shoulder for increased abilty to sleep at night.  Short Term Goal 5 Progress: Progressing toward goal Long Term Goals Time to Complete Long Term Goals: 6 weeks Long Term Goal 1: Patient will return to prior level of independence with all functional and desired leisure activities.  Long Term Goal 1 Progress: Progressing toward goal Long Term Goal 2: Patient will increase left shoulder AROM to WNL for increased abilty to complete personal hygiene. Long Term Goal 2 Progress: Progressing toward goal Long Term Goal 3: Patient will increase left shoulder strength to 5/5 for increased ability to lift pots and pans when cooking.  Long Term Goal 3 Progress: Progressing toward goal Long Term Goal 4: Patient will decrease fascial restrictions to minimal in her left shoulder region for greater mobility required to pull up pants.  Long Term Goal 4 Progress: Progressing toward goal Long Term Goal 5: Patient will decrease pain in her left shoulder region to 1/10 when completing desired daily activities.  Long Term Goal 5 Progress: Progressing toward goal  Problem List Patient Active Problem List  Diagnosis  . Breast  cancer  . Essential hypertension, benign  . DM (diabetes mellitus)  . OA (osteoarthritis)  . Morbidly obese  . Asthma  . Hyperlipidemia  . Chronic fatigue  . OSA (obstructive sleep apnea)  . Dyspnea  . HSV (herpes simplex virus) infection  . Otalgia  . Anal warts  . Peripheral edema  . Skin rash  . Bronchitis, acute  . Pain in joint, shoulder region  . Rotator cuff syndrome of left shoulder  . Muscle weakness (generalized)    End of Session Activity Tolerance: Patient tolerated treatment well General Behavior During Session: [facility] for tasks performed Cognition: [facility] for tasks performed OT Plan of Care OT Home Exercise Plan: Reviewed HEP, patient is diligent regarding completing HEP,   Shirlean Mylar, OTR/L  06/29/2012, 4:15 PM

## 2012-07-03 ENCOUNTER — Ambulatory Visit (HOSPITAL_COMMUNITY): Payer: Medicare Other

## 2012-07-05 ENCOUNTER — Ambulatory Visit (HOSPITAL_COMMUNITY)
Admission: RE | Admit: 2012-07-05 | Discharge: 2012-07-05 | Disposition: A | Discharge status: Home / Self Care | Payer: Medicare Other | Source: Ambulatory Visit | Attending: Orthopedic Surgery | Admitting: Orthopedic Surgery

## 2012-07-05 ENCOUNTER — Ambulatory Visit (HOSPITAL_COMMUNITY)
Admission: RE | Admit: 2012-07-05 | Discharge: 2012-07-05 | Disposition: A | Discharge status: Home / Self Care | Payer: Medicare Other | Source: Ambulatory Visit | Attending: Family Medicine | Admitting: Family Medicine

## 2012-07-05 DIAGNOSIS — M6281 Muscle weakness (generalized): Secondary | ICD-10-CM | POA: Diagnosis present

## 2012-07-05 NOTE — Progress Notes (Signed)
Occupational Therapy Treatment Patient Details  Name: Julie Clay MRN: [id number] Date of Birth: 02-May-1940  Today's Date: 07/05/2012 Time: 8413-2440 OT Time Calculation (min): 36 min Manual Therapy 239-256 17' Therapeutic Exercise 257-315 18'  Visit#: 3 of 12  Re-eval: 07/24/12    Authorization: Medicare  Authorization Time Period: before 10th visit  Authorization Visit#: 3 of 10  Subjective Symptoms/Limitations Symptoms: S:  My shoulder is not good, it hurts and I can't sleep.  The pain is almost constant. Pain Assessment Currently in Pain?: Yes Pain Score:   3 Pain Location: Shoulder Pain Orientation: Left  Precautions/Restrictions     Exercise/Treatments Supine Protraction: PROM;AAROM;12 reps Horizontal ABduction: PROM;AAROM;12 reps External Rotation: PROM;AAROM;12 reps Internal Rotation: PROM;AAROM;12 reps Flexion: PROM;AAROM;12 reps ABduction: PROM;AAROM;12 reps Seated Elevation: AROM;15 reps Extension: AROM;15 reps Row: AROM;15 reps Pulleys Flexion: 2 minutes ABduction: 2 minutes Therapy Ball Flexion: 15 reps ABduction: 15 reps ROM / Strengthening / Isometric Strengthening Wall Wash: attempted 2' completed 45" Thumb Tacks: 1' Prot/Ret//Elev/Dep: did not attempt           Manual Therapy Manual Therapy: Myofascial release Myofascial Release: Myofascial release and manual stretching to left upper arm, scapular, lateral upper arm, trapezius region to decrease pain and fascial restrictions and increase pain free mobility in left shoulder  Occupational Therapy Assessment and Plan OT Assessment and Plan Clinical Impression Statement: A:  Increase to 75% with PROM and AAROM.  No change in pain from MFR.  Patient states she cannot tolerate ice or heat to help control the pain.  Added pulleys which patient stated did not increase nor decrease the pain. Rehab Potential: Good OT Plan: P:  Attempt to complete wall was for the full time, decrease uppertrap  restrictions to decrease pain and resume prot/ret/elev/dep.   Goals Short Term Goals Time to Complete Short Term Goals: 3 weeks Short Term Goal 1: Patient will be educated on a HEP. Short Term Goal 2: Patient will increase AROM by 15 degrees in her left shoulder for greater ease with tucking her shirts in. Short Term Goal 3: Patient will increase her left shoulder strength to 4+/5 for increased ability to lift pots and pans when cooking.  Short Term Goal 4: Patient will decrease fascial restrictions from moderate to minimal-moderate for greater mobility needed for personal hygiene completion. Short Term Goal 5: Patient will decrease pain to 3/10 in her left shoulder for increased abilty to sleep at night.  Long Term Goals Time to Complete Long Term Goals: 6 weeks Long Term Goal 1: Patient will return to prior level of independence with all functional and desired leisure activities.  Long Term Goal 2: Patient will increase left shoulder AROM to WNL for increased abilty to complete personal hygiene. Long Term Goal 3: Patient will increase left shoulder strength to 5/5 for increased ability to lift pots and pans when cooking.  Long Term Goal 4: Patient will decrease fascial restrictions to minimal in her left shoulder region for greater mobility required to pull up pants.  Long Term Goal 5: Patient will decrease pain in her left shoulder region to 1/10 when completing desired daily activities.   Problem List Patient Active Problem List  Diagnosis  . Breast cancer  . Essential hypertension, benign  . DM (diabetes mellitus)  . OA (osteoarthritis)  . Morbidly obese  . Asthma  . Hyperlipidemia  . Chronic fatigue  . OSA (obstructive sleep apnea)  . Dyspnea  . HSV (herpes simplex virus) infection  . Otalgia  .  Anal warts  . Peripheral edema  . Skin rash  . Bronchitis, acute  . Pain in joint, shoulder region  . Rotator cuff syndrome of left shoulder  . Muscle weakness (generalized)     End of Session Activity Tolerance: Patient tolerated treatment well General Behavior During Session: [facility] for tasks performed Cognition: [facility]: [facility] [name] for tasks performed  GO [name] L. Noralee Stain, COTA/L 07/05/2012, 3:22 PM

## 2012-07-05 NOTE — Evaluation (Addendum)
Physical Therapy Evaluation  Patient Details  Name: Julie Clay MRN: [id number] Date of Birth: [birth year]  Today's Date: 07/05/2012 Time: 1350-1430 PT Time Calculation (min): 40 min Charges: 1 eval, 10' TE             Visit#: 1 of 12  Re-eval: 08/04/12 Assessment Diagnosis: Generalized Weakness Next MD Visit: Dr. Jeanice Lim - unscheduled Prior Therapy: Currently OT for L shoulder pain.  Authorization: MEDICARE    Authorization Time Period:    Authorization Visit#: 1 of 10   Past Medical History:  Past Medical History  Diagnosis Date  . Hypertension   . Hyperlipidemia   . Diabetes mellitus   . Asthma     spirometry (2011)- mild ventilary defect  . Cancer 2012    Breast  . Arthritis     Osteoarthritis  . Fatty liver 2013    enlarged  . Sliding hiatal hernia   . Ventral hernia, unspecified, without mention of obstruction or gangrene   . Lung nodule seen on imaging study 2013  . HSV (herpes simplex virus) infection    Past Surgical History:  Past Surgical History  Procedure Laterality Date  . Cesarean section    . 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 Breast surgery  jan 2013  . Hand ligament reconstruction    . 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  She reports that she cannot stand for any length of time, she cannot get up on her own when she falls. She reports that she has to use a swifer, unable to use a vacuum.  She retired in 2005 and feels that gradually she become weak since.  has cramping in her BLE.  Pain in her knees when she goes up and down the stairs.  How long can you stand comfortably?: difficulty standing to wash dishes How long can you walk comfortably?: 5  minutes Patient Stated Goals: "I want to get more stamina and be able to up from the floor if I have to." Pain Assessment Currently in Pain?: Yes Pain Score:   2 Pain Location: Back Pain Type: Chronic pain Pain Onset: Other (comment) Pain Frequency: Intermittent  Precautions/Restrictions  Precautions Precaution Comments: Hx of Lt sided breast cancer 1 year ago.   Balance Screening Balance Screen Has the patient fallen in the past 6 months: No Has the patient had a decrease in activity level because of a fear of falling? : Yes Is the patient reluctant to leave their home because of a fear of falling? : Yes (when it is icing)  Prior Functioning  Home Living Lives With: Spouse Prior Function Level of Independence: Independent with basic ADLs Driving: Yes Vocation: Retired Comments: enjoy going to Sport and exercise psychologist shows, the pool ( is self concious about her abdominal pannus), enjoys cooking, has a new grandchild and has 10 other grandchildren.   Cognition/Observation    Sensation/Coordination/Flexibility/Functional Tests Functional Tests Functional Tests: 5 STS: 20 sec - SOB  Assessment RLE Strength RLE Overall Strength Comments: hip internal rotation: 3/5, external rotation 3/5 Right Hip Flexion: 3+/5 Right Hip Extension: 3+/5 Right Hip ABduction: 3+/5 Right Hip ADduction: 3-/5 Right Knee Flexion: 5/5 Right Knee Extension: 5/5 LLE Strength LLE  Overall Strength Comments: hip internal rotation: 3/5, external rotation 3/5 Left Hip Flexion: 3+/5 Left Hip Extension: 3+/5 Left Hip ABduction: 3+/5 Left Hip ADduction: 3-/5 Left Knee Flexion: 5/5 Left Knee Extension: 5/5  Mobility/Balance  Ambulation/Gait Ambulation/Gait: Yes Ambulation Distance (Feet): 331 Feet (Dyspnea 7/10 on Borg Scale) Assistive device: None Gait Pattern: Lateral hip instability Posture/Postural Control Posture/Postural Control: Postural limitations Static Standing Balance Single Leg Stance -  Right Leg: 7 Single Leg Stance - Left Leg: 10 Tandem Stance - Right Leg: 10 Tandem Stance - Left Leg: 10 Rhomberg - Eyes Opened: 10 Rhomberg - Eyes Closed: 10   Exercise/Treatments Supine Bridges: 10 reps Straight Leg Raises: Both;5 reps Sidelying Hip ABduction: Both;10 reps Hip ADduction: Both;5 reps Clams: Both 10 reps Other Sidelying Knee Exercises: Reveres Clams Both 1-0 reps Prone  Hip Extension: Both;5 reps Other Prone Exercises: Hip Internal rotai   Physical Therapy Assessment and Plan PT Assessment and Plan Clinical Impression Statement: Pt is a 72 year old female referred to PT for BLE weakness and decreased activity tolerance with following impairments listed below.  After evaluation it was found that she has significant dyspnea with 2 minute walk test, pain to B heels and back limiting her ability to participate in community ambulation. Pt will benefit from skilled therapeutic intervention in order to improve on the following deficits: Decreased activity tolerance;Difficulty walking;Impaired perceived functional ability;Decreased strength;Pain Rehab Potential: Good PT Frequency: Min 3X/week PT Duration: 6 weeks PT Treatment/Interventions: Gait training;Stair training;Functional mobility training;Therapeutic activities;Therapeutic exercise;Balance training;Patient/family education PT Plan: Continue with general LE strengthening to improve activity tolerance and LE strength (squats, heel and toe raises, heel and and toe walking, retro gait) take O2 sats during treatment. Discuss diaphragmatic breathing.     Goals Home Exercise Program Pt will Perform Home Exercise Program: Independently PT Goal: Perform Home Exercise Program - Progress: Goal set today PT Short Term Goals Time to Complete Short Term Goals: 3 weeks PT Short Term Goal 1: Pt will improve her BLE strength in order to ambulate independently x10 minutes.  PT Short Term Goal 2: Pt will demonstrate 5 sit to  stands in 12.7 seconds for approrpriate age.  PT Long Term Goals Time to Complete Long Term Goals: Other (comment) (6 weeks) PT Long Term Goal 1: Pt will improve her diaphragmatic breathing and present with 3/10 dyspena on the Borg Scale after walking for 5 minutes.  PT Long Term Goal 2: Pt will improve her gait speed and ambulate 588 feet in 2 minutes for appropriate age adjusted walking distance for improved safety while walking in the community.  Long Term Goal 3: Pt will be independent with transfer from floor to standing for improved safety in case of falls while alone.   Problem List Patient Active Problem List  Diagnosis  . Breast cancer  . Essential hypertension, benign  . DM (diabetes mellitus)  . OA (osteoarthritis)  . Morbidly obese  . Asthma  . Hyperlipidemia  . Chronic fatigue  . OSA (obstructive sleep apnea)  . Dyspnea  . HSV (herpes simplex virus) infection  . Otalgia  . Anal warts  . Peripheral edema  . Skin rash  . Bronchitis, acute  . Pain in joint, shoulder region  . Rotator cuff syndrome of left shoulder  . Muscle weakness (generalized)    PT Plan of Care PT Home Exercise Plan: see scanned report  PT Patient Instructions: answered questions regarding POC, balance and fall prevention.  Consulted and Agree with Plan of Care: Patient  GP Functional Assessment Tool Used: clinical observation  Functional Limitation: Mobility: Walking and moving around Mobility: Walking and Moving Around Current Status [phone]): At least 20 percent but less than 40 percent impaired, limited or restricted Mobility: Walking and Moving Around Goal Status [phone]): At least 1 percent but less than 20 percent impaired, limited or restricted  Jaquavious Mercer, PT 07/05/2012, 6:20 PM  Physician Documentation Your signature is required to indicate approval of the treatment plan as stated above.  Please sign and either send electronically or make a copy of this report for your files and  return this physician signed original.   Please mark one 1.__approve of plan  2. ___approve of plan with the following conditions.   ______________________________                                                          _____________________ Physician Signature                                                                                                             Date

## 2012-07-06 ENCOUNTER — Ambulatory Visit (HOSPITAL_COMMUNITY)
Admission: RE | Admit: 2012-07-06 | Discharge: 2012-07-06 | Disposition: A | Discharge status: Home / Self Care | Payer: Medicare Other | Source: Ambulatory Visit | Attending: Orthopedic Surgery | Admitting: Orthopedic Surgery

## 2012-07-06 NOTE — Progress Notes (Signed)
Occupational Therapy Treatment Patient Details  Name: Julie Clay MRN: [id number] Date of Birth: [birth year]  Today's Date: 07/06/2012 Time: 1350-1430 OT Time Calculation (min): 40 min Manual Therapy 1350-1405 15' Therapeutic Exercises 1405-1430 25' Visit#: 4 of 12  Re-eval: 07/24/12    Authorization: Medicare  Authorization Time Period: before 10th visit  Authorization Visit#: 4 of 10  Subjective S:  I had to take a vicodan so that I could sleep.   Pain Assessment Currently in Pain?: Yes Pain Score:   4 Pain Location: Shoulder Pain Orientation: Left Pain Type: Chronic pain  Precautions/Restrictions   progress as tolerated  Exercise/Treatments Supine Protraction: PROM;10 reps Horizontal ABduction: PROM;10 reps External Rotation: PROM;10 reps Internal Rotation: PROM;10 reps Flexion: PROM;10 reps ABduction: PROM;10 reps Standing Extension: Theraband;15 reps Theraband Level (Shoulder Extension): Level 2 (Red) Row: Theraband;15 reps Theraband Level (Shoulder Row): Level 2 (Red) Retraction: Theraband;10 reps Theraband Level (Shoulder Retraction): Level 2 (Red) (attempted 15, could only complete 5) Pulleys Flexion: 2 minutes ABduction: 2 minutes Stretches Corner Stretch: 1 rep;10 seconds Cross Chest Stretch: 1 rep;10 seconds Wall Stretch - Flexion: 1 rep;10 seconds Other Shoulder Stretches: table slides for flexion, abduction, external and internal rotation and horizontal abduction for 15 seconds each    Manual Therapy Manual Therapy: Myofascial release Myofascial Release: Myofascial release and manual stretching to left upper arm, scapular, lateral upper arm, trapezius region to decrease pain and fascial restrictions and increase pain free mobility in left shoulder   Occupational Therapy Assessment and Plan OT Assessment and Plan Clinical Impression Statement: A:  Patient had questions regarding accuracy of HEP, therefore, we completed/reviewed her HEP this date.   We made an adjustment to her technique with her cross chest stretch and her flexion wall stretch, otherwise her technique was accurate.  OT Plan: P:  Resume AAROM in supine, add additional postural exercises.     Goals Short Term Goals Time to Complete Short Term Goals: 3 weeks Short Term Goal 1: Patient will be educated on a HEP. Short Term Goal 1 Progress: Progressing toward goal Short Term Goal 2: Patient will increase AROM by 15 degrees in her left shoulder for greater ease with tucking her shirts in. Short Term Goal 2 Progress: Progressing toward goal Short Term Goal 3: Patient will increase her left shoulder strength to 4+/5 for increased ability to lift pots and pans when cooking.  Short Term Goal 3 Progress: Progressing toward goal Short Term Goal 4: Patient will decrease fascial restrictions from moderate to minimal-moderate for greater mobility needed for personal hygiene completion. Short Term Goal 4 Progress: Progressing toward goal Short Term Goal 5: Patient will decrease pain to 3/10 in her left shoulder for increased abilty to sleep at night.  Short Term Goal 5 Progress: Progressing toward goal Long Term Goals Time to Complete Long Term Goals: 6 weeks Long Term Goal 1: Patient will return to prior level of independence with all functional and desired leisure activities.  Long Term Goal 1 Progress: Progressing toward goal Long Term Goal 2: Patient will increase left shoulder AROM to WNL for increased abilty to complete personal hygiene. Long Term Goal 2 Progress: Progressing toward goal Long Term Goal 3: Patient will increase left shoulder strength to 5/5 for increased ability to lift pots and pans when cooking.  Long Term Goal 3 Progress: Progressing toward goal Long Term Goal 4: Patient will decrease fascial restrictions to minimal in her left shoulder region for greater mobility required to pull up pants.  Long Term  Goal 4 Progress: Progressing toward goal Long Term Goal  5: Patient will decrease pain in her left shoulder region to 1/10 when completing desired daily activities.  Long Term Goal 5 Progress: Progressing toward goal  Problem List Patient Active Problem List  Diagnosis  . Breast cancer  . Essential hypertension, benign  . DM (diabetes mellitus)  . OA (osteoarthritis)  . Morbidly obese  . Asthma  . Hyperlipidemia  . Chronic fatigue  . OSA (obstructive sleep apnea)  . Dyspnea  . HSV (herpes simplex virus) infection  . Otalgia  . Anal warts  . Peripheral edema  . Skin rash  . Bronchitis, acute  . Pain in joint, shoulder region  . Rotator cuff syndrome of left shoulder  . Muscle weakness (generalized)    End of Session Activity Tolerance: Patient tolerated treatment well General Behavior During Session: [facility] for tasks performed Cognition: [facility] for tasks performed OT Plan of Care OT Home Exercise Plan: Reviewed and completed HEP as outlined above in shoulder stretch portion of note.   Consulted and Agree with Plan of Care: Patient  Shirlean Mylar, OTR/L  07/06/2012, 4:46 PM

## 2012-07-07 ENCOUNTER — Other Ambulatory Visit (HOSPITAL_COMMUNITY): Payer: Self-pay | Admitting: Orthopedic Surgery

## 2012-07-07 DIAGNOSIS — M25519 Pain in unspecified shoulder: Secondary | ICD-10-CM | POA: Diagnosis not present

## 2012-07-07 DIAGNOSIS — M25512 Pain in left shoulder: Secondary | ICD-10-CM

## 2012-07-10 ENCOUNTER — Ambulatory Visit (INDEPENDENT_AMBULATORY_CARE_PROVIDER_SITE_OTHER): Payer: Medicare Other | Admitting: Internal Medicine

## 2012-07-10 ENCOUNTER — Encounter: Payer: Self-pay | Admitting: Internal Medicine

## 2012-07-10 VITALS — BP 148/70 | HR 88 | Temp 97.5°F | Ht 65.0 in | Wt 255.4 lb

## 2012-07-10 DIAGNOSIS — J45909 Unspecified asthma, uncomplicated: Secondary | ICD-10-CM | POA: Diagnosis not present

## 2012-07-10 NOTE — Progress Notes (Signed)
Subjective:    Patient ID: Julie Clay, female    DOB: [birth year]   MRN: [id number]  HPI  72 yowm never smoker with dx of asthma age 72 weeks but did not need daily medications until 1980's with tendency to flares of bronchits fall/winter overall satisfied with control but always sob with exertion x making a bed or walking x 100 ft referred 11/15/2011 to pulmonary clinic by DR Riesa Pope.  11/15/2011 1st pulmonary cc sob x decades with exertion without much variation unless having flare of bronchitis with  min chronic cough. First thing in am uses dulera and albuterol always first..  singulair may have helped last year get over an episode of bronchitis, not clear though she needs to maintain on it.  dulera 100 Take 2 puffs first thing in am and then another 2 puffs about 12 hours later.  Only use your albuterol as a rescue medication(plan B= albuterol hfa/puffer and plan C nebulizer)  to be used if you can't catch your breath by resting or doing a relaxed purse lip breathing pattern. The less you use it, the better it will work when you need it.  GERD diet   02/24/12 lisinopril x one week > asthma attack and gradually improved   04/11/2012 f/u ov/Julie Clay cc doe x landing to house x no change in 5  Years (same thing with house work) and had flare of cough and "wheeze"  in [place] late summer rx with cough syrup while maintaining on dulera 100 2 bid with rare need for daytime saba rec Try dulera 200 Take 2 puffs first thing in am and then another 2 puffs about 12 hours later and if better breathing, call refills > no change so went back to 100 and need for saba   07/10/2012 f/u ov/Julie Clay cc breathing much better, not limited from desired activities  No obvious daytime variabilty or assoc chronic cough or cp or chest tightness, subjective wheeze overt sinus or hb symptoms. No unusual exp hx .    Sleeping ok without nocturnal  or early am exacerbation  of respiratory  c/o's or need for noct saba. Also denies  any obvious fluctuation of symptoms with weather or environmental changes or other aggravating or alleviating factors except as outlined above   ROS  The following are not active complaints unless bolded sore throat, dysphagia, dental problems, itching, sneezing,  nasal congestion or excess/ purulent secretions, ear ache,   fever, chills, sweats, unintended wt loss, pleuritic or exertional cp, hemoptysis,  orthopnea pnd or leg swelling, presyncope, palpitations, heartburn, abdominal pain, anorexia, nausea, vomiting, diarrhea  or change in bowel or urinary habits, change in stools or urine, dysuria,hematuria,  rash, arthralgias, visual complaints, headache, numbness weakness or ataxia or problems with walking or coordination,  change in mood/affect or memory.             Objective:   Physical Exam  Obese wf nad   Wt 256 04/11/2012  Vs 07/10/2012  255 Wt Readings from Last 3 Encounters:  11/15/11 261 lb 3.2 oz (118.48 kg)  10/15/11 259 lb 9.6 oz (117.754 kg)  10/08/11 259 lb 1.3 oz (117.518 kg)    HEENT: nl dentition, turbinates, and orophanx. Nl external ear canals without cough reflex   NECK :  without JVD/Nodes/TM/ nl carotid upstrokes bilaterally   LUNGS: no acc muscle use, clear to A and P bilaterally though bs are distant without cough on insp or exp maneuvers   CV:  RRR  no  s3 or murmur or increase in P2, no edema   ABD:  Massively obese/ soft and nontender with limited excursion in the supine position. No bruits or organomegaly, bowel sounds nl  MS:  warm without deformities, calf tenderness, cyanosis or clubbing  SKIN: warm and dry without lesions    NEURO:  alert, approp, no deficits    CT chest 06/25/11 Non specific  Tiny nodules and post op changes      Assessment & Plan:

## 2012-07-10 NOTE — Patient Instructions (Addendum)
Work on Musician technique:  relax and gently blow all the way out then take a nice smooth deep breath back in, triggering the inhaler at same time you start breathing in.  Hold for up to 5 seconds if you can.  Rinse and gargle with water when done   If your mouth or throat starts to bother you,   I suggest you time the inhaler to your dental care and after using the inhaler(s) brush teeth and tongue with a baking soda containing toothpaste and when you rinse this out, gargle with it first to see if this helps your mouth and throat.     Plan A = automatic  dulera 100 and singulair (ok to stop and see if sinus symptoms worsen)  Plan  B = backup For short of breath> ventolin For nasal symptoms > decongestion   If you are satisfied with your treatment plan let your doctor know and he/she can either refill your medications or you can return here when your prescription runs out.     If in any way you are not 100% satisfied,  please tell us.  If 100% better, tell your friends!

## 2012-07-12 ENCOUNTER — Ambulatory Visit (HOSPITAL_COMMUNITY)
Admission: RE | Admit: 2012-07-12 | Discharge: 2012-07-12 | Disposition: A | Discharge status: Home / Self Care | Payer: Medicare Other | Source: Ambulatory Visit | Attending: Specialist | Admitting: Specialist

## 2012-07-12 ENCOUNTER — Ambulatory Visit (HOSPITAL_COMMUNITY): Payer: Medicare Other

## 2012-07-12 ENCOUNTER — Ambulatory Visit (HOSPITAL_COMMUNITY)
Admission: RE | Admit: 2012-07-12 | Discharge: 2012-07-12 | Disposition: A | Discharge status: Home / Self Care | Payer: Medicare Other | Source: Ambulatory Visit | Attending: Orthopedic Surgery | Admitting: Orthopedic Surgery

## 2012-07-12 DIAGNOSIS — IMO0001 Reserved for inherently not codable concepts without codable children: Secondary | ICD-10-CM | POA: Diagnosis not present

## 2012-07-12 DIAGNOSIS — M6281 Muscle weakness (generalized): Secondary | ICD-10-CM

## 2012-07-12 DIAGNOSIS — I10 Essential (primary) hypertension: Secondary | ICD-10-CM | POA: Diagnosis not present

## 2012-07-12 DIAGNOSIS — E119 Type 2 diabetes mellitus without complications: Secondary | ICD-10-CM | POA: Diagnosis not present

## 2012-07-12 NOTE — Assessment & Plan Note (Signed)
-  50% 11/15/2011 > 75% 04/11/2012   -PFT's 04/11/2012 FEV1  1.54 (72%) ratio 66 and no better p B2,  DLCO 63 corrects to 143%  I had an extended summary  discussion with the patient today lasting 15 to 20 minutes of a 25 minute visit on the following issues:   The proper method of use, as well as anticipated side effects, of a metered-dose inhaler are discussed and demonstrated to the patient. Improved effectiveness after extensive coaching during this visit to a level of approximately  75-90%  All goals of chronic asthma control met including optimal function and elimination of symptoms with minimal need for rescue therapy.  Contingencies discussed in full including contacting this office immediately if not controlling the symptoms using the rule of two's.

## 2012-07-12 NOTE — Progress Notes (Signed)
Occupational Therapy Treatment Patient Details  Name: BRENNLEY CURTICE MRN: [id number] Date of Birth: 03/13/1941  Today's Date: 07/12/2012 Time: 5784-6962 OT Time Calculation (min): 45 min Manual Therapy 235-259 24' Therapeutic Exercise 300-320 20'  Visit#: 5 of 12  Re-eval: 07/24/12    Authorization: Medicare  Authorization Time Period: before 10th visit  Authorization Visit#: 5 of 10  Subjective Symptoms/Limitations Symptoms: S:  It is painful mostly at night.  Precautions/Restrictions     Exercise/Treatments Supine Protraction: PROM;AAROM;10 reps Horizontal ABduction: PROM;AAROM;10 reps External Rotation: PROM;AAROM;10 reps Internal Rotation: PROM;AAROM;10 reps Flexion: PROM;AAROM;10 reps ABduction: PROM;AAROM;10 reps Standing Extension:  (resume all next session) Pulleys Flexion: 2 minutes ABduction: 2 minutes Therapy Ball Flexion: 20 reps ABduction: 20 reps ROM / Strengthening / Isometric Strengthening Wall Wash: 2' keeping at shoulder height.        Manual Therapy Manual Therapy: Myofascial release Myofascial Release: Myofascial release and manual stretching to left upper arm, scapular area, lateral upper arm and upper trap to decrease pain and fascial restrictions and increase pain free mobility in left shoulder.  Occupational Therapy Assessment and Plan OT Assessment and Plan Clinical Impression Statement: A:  Resumed dowel rod exercies with some limitations with ER secondary to pain. Patient had an area alond superior spine of scapula causeing radiating pain down to elbow which diminished some with Myofascial Release. Some exercises missed due to time. OT Plan: P:  Resunme missed exercises to increase scapluar stability and postural stability.   Goals Short Term Goals Time to Complete Short Term Goals: 3 weeks Short Term Goal 1: Patient will be educated on a HEP. Short Term Goal 2: Patient will increase AROM by 15 degrees in her left shoulder for  greater ease with tucking her shirts in. Short Term Goal 3: Patient will increase her left shoulder strength to 4+/5 for increased ability to lift pots and pans when cooking.  Short Term Goal 4: Patient will decrease fascial restrictions from moderate to minimal-moderate for greater mobility needed for personal hygiene completion. Short Term Goal 5: Patient will decrease pain to 3/10 in her left shoulder for increased abilty to sleep at night.  Long Term Goals Time to Complete Long Term Goals: 6 weeks Long Term Goal 1: Patient will return to prior level of independence with all functional and desired leisure activities.  Long Term Goal 2: Patient will increase left shoulder AROM to WNL for increased abilty to complete personal hygiene. Long Term Goal 3: Patient will increase left shoulder strength to 5/5 for increased ability to lift pots and pans when cooking.  Long Term Goal 4: Patient will decrease fascial restrictions to minimal in her left shoulder region for greater mobility required to pull up pants.  Long Term Goal 5: Patient will decrease pain in her left shoulder region to 1/10 when completing desired daily activities.   Problem List Patient Active Problem List  Diagnosis  . Breast cancer  . Essential hypertension, benign  . DM (diabetes mellitus)  . OA (osteoarthritis)  . Morbidly obese  . Asthma  . Hyperlipidemia  . Chronic fatigue  . OSA (obstructive sleep apnea)  . Dyspnea  . HSV (herpes simplex virus) infection  . Otalgia  . Anal warts  . Peripheral edema  . Skin rash  . Bronchitis, acute  . Pain in joint, shoulder region  . Rotator cuff syndrome of left shoulder  . Muscle weakness (generalized)    End of Session Activity Tolerance: Patient tolerated treatment well General Behavior During Session: [facility]  for tasks performed Cognition: [facility] for tasks performed  GO   Handy Mcloud L. Noralee Stain, COTA/L 07/12/2012, 3:27 PM

## 2012-07-12 NOTE — Progress Notes (Addendum)
Physical Therapy Treatment Patient Details  Name: Julie Clay MRN: [id number] Date of Birth: [birth year]  Today's Date: 07/12/2012 Time: 4098-1191 PT Time Calculation (min): 46 min Charge: therex 86'   Visit#: 2 of 12  Re-eval: 08/04/12    Authorization: MEDICARE  Authorization Time Period:    Authorization Visit#: 2 of 10   Subjective: Symptoms/Limitations Symptoms: No real current pain though heel does increase pain with some movements Pain Assessment Currently in Pain?: No/denies  Objective:   Exercise/Treatments Standing Heel Raises: 10 reps;Limitations Heel Raises Limitations: toe raises Functional Squat: 15 reps Other Standing Knee Exercises: heel/ toe raises 1RT Seated Other Seated Knee Exercises: diaphragmatic breathing NuStep x 8 minutes with goal step per minute greater than 70 Supine Bridges: 15 reps Straight Leg Raises: Both;10 reps Sidelying Hip ABduction: Both;10 reps Hip ADduction: Both;10 reps Other Sidelying Knee Exercises: Reverese Clams Both 10 reps Prone  Hip Extension: Both;15 reps Other Prone Exercises: Hip Internal rotation x 15      Physical Therapy Assessment and Plan PT Assessment and Plan Clinical Impression Statement: Began general LE strengthening per PT POC to improve overall LE strength and increase activity tolerance.  [name], [name] PT Plan: Continue with general LE strengthening to improve activity tolerance and LE strength    Goals    Problem List Patient Active Problem List  Diagnosis  . Breast cancer  . Essential hypertension, benign  . DM (diabetes mellitus)  . OA (osteoarthritis)  . Morbidly obese  . Asthma  . Hyperlipidemia  . Chronic fatigue  . OSA (obstructive sleep  apnea)  . Dyspnea  . HSV (herpes simplex virus) infection  . Otalgia  . Anal warts  . Peripheral edema  . Skin rash  . Bronchitis, acute  . Pain in joint, shoulder region  . Rotator cuff syndrome of left shoulder  . Muscle weakness (generalized)    PT - End of Session Activity Tolerance: Patient tolerated treatment well;Patient limited by fatigue General Behavior During Session: [facility] for tasks performed Cognition: [facility] for tasks performed  GP    Julie Clay 07/12/2012, 6:13 PM

## 2012-07-13 ENCOUNTER — Ambulatory Visit (HOSPITAL_COMMUNITY)
Admission: RE | Admit: 2012-07-13 | Discharge: 2012-07-13 | Disposition: A | Discharge status: Home / Self Care | Payer: Medicare Other | Source: Ambulatory Visit | Attending: Orthopedic Surgery | Admitting: Orthopedic Surgery

## 2012-07-13 ENCOUNTER — Ambulatory Visit (HOSPITAL_COMMUNITY)
Admission: RE | Admit: 2012-07-13 | Discharge: 2012-07-13 | Disposition: A | Discharge status: Home / Self Care | Payer: Medicare Other | Source: Ambulatory Visit | Attending: Specialist | Admitting: Specialist

## 2012-07-13 DIAGNOSIS — M25519 Pain in unspecified shoulder: Secondary | ICD-10-CM | POA: Diagnosis not present

## 2012-07-13 DIAGNOSIS — M6281 Muscle weakness (generalized): Secondary | ICD-10-CM

## 2012-07-13 DIAGNOSIS — S46819A Strain of other muscles, fascia and tendons at shoulder and upper arm level, unspecified arm, initial encounter: Secondary | ICD-10-CM | POA: Diagnosis not present

## 2012-07-13 DIAGNOSIS — M67919 Unspecified disorder of synovium and tendon, unspecified shoulder: Secondary | ICD-10-CM | POA: Diagnosis not present

## 2012-07-13 DIAGNOSIS — Z853 Personal history of malignant neoplasm of breast: Secondary | ICD-10-CM | POA: Diagnosis not present

## 2012-07-13 DIAGNOSIS — X58XXXA Exposure to other specified factors, initial encounter: Secondary | ICD-10-CM | POA: Insufficient documentation

## 2012-07-13 DIAGNOSIS — M19019 Primary osteoarthritis, unspecified shoulder: Secondary | ICD-10-CM | POA: Diagnosis not present

## 2012-07-13 DIAGNOSIS — IMO0001 Reserved for inherently not codable concepts without codable children: Secondary | ICD-10-CM | POA: Diagnosis not present

## 2012-07-13 DIAGNOSIS — I10 Essential (primary) hypertension: Secondary | ICD-10-CM | POA: Diagnosis not present

## 2012-07-13 DIAGNOSIS — M719 Bursopathy, unspecified: Secondary | ICD-10-CM | POA: Diagnosis not present

## 2012-07-13 DIAGNOSIS — E119 Type 2 diabetes mellitus without complications: Secondary | ICD-10-CM | POA: Diagnosis not present

## 2012-07-13 DIAGNOSIS — M25512 Pain in left shoulder: Secondary | ICD-10-CM

## 2012-07-13 NOTE — Progress Notes (Signed)
Occupational Therapy Treatment Patient Details  Name: Julie Clay MRN: [id number] Date of Birth: Apr 14, 1941  Today's Date: 07/13/2012 Time: 4098-1191 OT Time Calculation (min): 39 min Manual Therapy:  4782-9562 19' Therapeutic Exercises:  1455-1515 20' Visit#: 6 of 12  Re-eval: 07/24/12    Authorization: Medicare  Authorization Time Period:    Authorization Visit#: 6 of 10  Subjective S:  its doing better in the day, and just as bad at night. Pain Assessment Currently in Pain?: Yes Pain Score:   3  Precautions/Restrictions   progress as tolerated  Exercise/Treatments Supine Protraction: PROM;AROM;10 reps Horizontal ABduction: PROM;AROM;10 reps External Rotation: PROM;AROM;10 reps Internal Rotation: PROM;AROM;10 reps Flexion: PROM;AROM;10 reps ABduction: PROM;AROM;10 reps Seated Elevation: AROM;15 reps Extension: AROM;15 reps Row: AROM;15 reps Protraction: AAROM;10 reps;Limitations Horizontal ABduction: AAROM;10 reps Horizontal ABduction Limitations: painful in left shoulder when adducting to right External Rotation: AAROM;10 reps Internal Rotation: AAROM;10 reps Flexion: AAROM;10 reps Abduction: AAROM;10 reps;Limitations ABduction Limitations: with mod facilitation from OT Standing Extension: Limitations Extension Limitations: resume theraband next visit Therapy Ball Flexion: 20 reps ABduction: 20 reps ROM / Strengthening / Isometric Strengthening Wall Wash: resume next visit Proximal Shoulder Strengthening, Supine: add next visit      Manual Therapy Manual Therapy: Myofascial release Myofascial Release: Myofascial release and manual stretching to left upper arm, scapular area, lateral upper arm and upper trap to decrease pain and fascial restrictions and increase pain free mobility in left shoulder  Occupational Therapy Assessment and Plan OT Assessment and Plan Clinical Impression Statement: A:  Transitioned to AROM in supine and added AAROM in  seated, as patient is demonstrating full AAROM in supine. OT Plan: P:  Add proximal shoulder strengthening for increased scapular stability, follow up on results from MRI.   Goals Short Term Goals Time to Complete Short Term Goals: 3 weeks Short Term Goal 1: Patient will be educated on a HEP. Short Term Goal 1 Progress: Progressing toward goal Short Term Goal 2: Patient will increase AROM by 15 degrees in her left shoulder for greater ease with tucking her shirts in. Short Term Goal 2 Progress: Progressing toward goal Short Term Goal 3: Patient will increase her left shoulder strength to 4+/5 for increased ability to lift pots and pans when cooking.  Short Term Goal 3 Progress: Progressing toward goal Short Term Goal 4: Patient will decrease fascial restrictions from moderate to minimal-moderate for greater mobility needed for personal hygiene completion. Short Term Goal 4 Progress: Progressing toward goal Short Term Goal 5: Patient will decrease pain to 3/10 in her left shoulder for increased abilty to sleep at night.  Short Term Goal 5 Progress: Progressing toward goal Long Term Goals Time to Complete Long Term Goals: 6 weeks Long Term Goal 1: Patient will return to prior level of independence with all functional and desired leisure activities.  Long Term Goal 1 Progress: Progressing toward goal Long Term Goal 2: Patient will increase left shoulder AROM to WNL for increased abilty to complete personal hygiene. Long Term Goal 2 Progress: Progressing toward goal Long Term Goal 3: Patient will increase left shoulder strength to 5/5 for increased ability to lift pots and pans when cooking.  Long Term Goal 3 Progress: Progressing toward goal Long Term Goal 4: Patient will decrease fascial restrictions to minimal in her left shoulder region for greater mobility required to pull up pants.  Long Term Goal 4 Progress: Progressing toward goal Long Term Goal 5: Patient will decrease pain in her left  shoulder region to  1/10 when completing desired daily activities.  Long Term Goal 5 Progress: Progressing toward goal  Problem List Patient Active Problem List  Diagnosis  . Breast cancer  . Essential hypertension, benign  . DM (diabetes mellitus)  . OA (osteoarthritis)  . Morbidly obese  . Asthma  . Hyperlipidemia  . Chronic fatigue  . OSA (obstructive sleep apnea)  . Dyspnea  . HSV (herpes simplex virus) infection  . Otalgia  . Anal warts  . Peripheral edema  . Skin rash  . Bronchitis, acute  . Pain in joint, shoulder region  . Rotator cuff syndrome of left shoulder  . Muscle weakness (generalized)    End of Session Activity Tolerance: Patient tolerated treatment well General Behavior During Session: [facility] for tasks performed Cognition: [facility] for tasks performed  Shirlean Mylar, OTR/L  07/13/2012, 4:44 PM

## 2012-07-13 NOTE — Progress Notes (Signed)
Physical Therapy Treatment Patient Details  Name: Julie Clay MRN: [id number] Date of Birth: January 25, 1941  Today's Date: 07/13/2012 Time: 1518-1600 PT Time Calculation (min): 42 min Charge: therex 55'  Visit#: 3 of 12  Re-eval: 08/04/12    Authorization: MEDICARE  Authorization Time Period:    Authorization Visit#: 3 of 10   Subjective: Symptoms/Limitations Symptoms: No pain currently though does increase with certain movemetns. Pain Assessment Currently in Pain?: No/denies  Objective:   Exercise/Treatments Standing Heel Raises: 15 reps;Limitations Heel Raises Limitations: toe raises Functional Squat: 15 reps Other Standing Knee Exercises: high march hold 10x 5" Supine Bridges: 15 reps Straight Leg Raises: Both;15 reps Other Supine Knee Exercises: windshield wipers 15x Sidelying Hip ABduction: Both;15 reps Hip ADduction: Both;15 reps Clams: Both 15 reps Other Sidelying Knee Exercises: Reverese Clams Both 15 reps Prone  Hip Extension: Both;15 reps Other Prone Exercises: Hip Internal rotation x 15      Physical Therapy Assessment and Plan PT Assessment and Plan Clinical Impression Statement: Continued PT POC for LE strengthening, pt able to complete all therex with min cueing for control and form.  Pt limited by fatigue with activities, requested to hold Nustep this session.  No reports of increased pain with activity.  O2 sat % range from 95-98% during and following  PT Plan: Continue with general LE strengthening to improve activity tolerance and LE strength  Resume NuStep next session    Goals    Problem List Patient Active Problem List  Diagnosis  . Breast cancer  . Essential hypertension, benign  . DM (diabetes mellitus)  . OA (osteoarthritis)  . Morbidly obese  . Asthma  . Hyperlipidemia  . Chronic fatigue  . OSA (obstructive sleep apnea)  . Dyspnea  . HSV (herpes simplex virus) infection  . Otalgia  . Anal warts  . Peripheral edema  . Skin  rash  . Bronchitis, acute  . Pain in joint, shoulder region  . Rotator cuff syndrome of left shoulder  . Muscle weakness (generalized)    PT - End of Session Activity Tolerance: Patient tolerated treatment well;Patient limited by fatigue General Behavior During Session: [facility] for tasks performed Cognition: [facility] for tasks performed  GP    [name] 07/13/2012, 5:00 PM

## 2012-07-14 ENCOUNTER — Ambulatory Visit (HOSPITAL_COMMUNITY): Payer: Medicare Other

## 2012-07-14 DIAGNOSIS — M25519 Pain in unspecified shoulder: Secondary | ICD-10-CM | POA: Diagnosis not present

## 2012-07-18 ENCOUNTER — Ambulatory Visit (HOSPITAL_COMMUNITY)
Admission: RE | Admit: 2012-07-18 | Discharge: 2012-07-18 | Disposition: A | Discharge status: Home / Self Care | Payer: Medicare Other | Source: Ambulatory Visit | Attending: Orthopedic Surgery | Admitting: Orthopedic Surgery

## 2012-07-18 DIAGNOSIS — E119 Type 2 diabetes mellitus without complications: Secondary | ICD-10-CM | POA: Diagnosis not present

## 2012-07-18 DIAGNOSIS — I10 Essential (primary) hypertension: Secondary | ICD-10-CM | POA: Insufficient documentation

## 2012-07-18 DIAGNOSIS — IMO0001 Reserved for inherently not codable concepts without codable children: Secondary | ICD-10-CM | POA: Diagnosis not present

## 2012-07-18 DIAGNOSIS — M6281 Muscle weakness (generalized): Secondary | ICD-10-CM | POA: Insufficient documentation

## 2012-07-18 NOTE — Progress Notes (Signed)
Physical Therapy Treatment Patient Details  Name: Julie Clay MRN: [id number] Date of Birth: 05-May-1940  Today's Date: 07/18/2012 Time: 1302-1345 PT Time Calculation (min): 43 min Charge: therex 63'  Visit#: 4 of 12  Re-eval: 08/04/12    Authorization: MEDICARE  Authorization Time Period:    Authorization Visit#: 4 of 10   Subjective: Symptoms/Limitations Symptoms: Currently no pain, pt cancelled all occupational therapy plans on having surgery next week. Pain Assessment Currently in Pain?: No/denies  Objective:   Exercise/Treatments Aerobic Stationary Bike: NuStep x goal step per minutes greated than 70 Standing Heel Raises: 15 reps;Limitations Heel Raises Limitations: toe raises Functional Squat: 15 reps Other Standing Knee Exercises: high march hold 10x 5" Other Standing Knee Exercises: standing hip abduction and extension Supine Other Supine Knee Exercises: windshield wipers 15x Sidelying Hip ABduction: Both;15 reps Other Sidelying Knee Exercises: Reverese Clams Both 15 reps Prone  Hip Extension: Both;15 reps Other Prone Exercises: Hip Internal rotation x 15      Physical Therapy Assessment and Plan PT Assessment and Plan Clinical Impression Statement: Continues with current POC for LE strengthening and activity tolerance.  Pt with min cueing for proper technique with exercises to assure correct musculature activation.  Pt plans to have surgery on shoulder next week, following discussion decision was made for her to cancel her occupational therapy OP sessions and pt will continue OPPT for 2 more sessions reviewed goals and encouraged pt to increase frequency with HEP.  Pt with improved functional strength, able to complete 5 sit to stands in 12.1 seconds with no HHA, improved O2sat% following education on diaphragmatic breathing able to keep O2 sat at 96% thoughout session.  Pt stated she was unsure how long able to ambulate, stated really depends on time of  the day and what other activities she has complesed that day.   PT Plan: Continue with current POC x 2 more sessions then shoulder surgery.  Continue with general LE strengthening to improve activity tolerance and LE strength   Encourage pt importance of completeing HEP.    Goals Home Exercise Program Pt will Perform Home Exercise Program: Independently PT Goal: Perform Home Exercise Program - Progress: Met PT Short Term Goals Time to Complete Short Term Goals: 3 weeks PT Short Term Goal 1: Pt will improve her BLE strength in order to ambulate independently x10 minutes.  PT Short Term Goal 1 - Progress: Progressing toward goal PT Short Term Goal 2: Pt will demonstrate 5 sit to stands in 12.7 seconds for approrpriate age.  PT Short Term Goal 2 - Progress: Met (12.1 seconds) PT Long Term Goals Time to Complete Long Term Goals: Other (comment) (6 weeks) PT Long Term Goal 1: Pt will improve her diaphragmatic breathing and present with 3/10 dyspena on the Borg Scale after walking for 5 minutes.  PT Long Term Goal 1 - Progress: Progressing toward goal PT Long Term Goal 2: Pt will improve her gait speed and ambulate 588 feet in 2 minutes for appropriate age adjusted walking distance for improved safety while walking in the community.  PT Long Term Goal 2 - Progress: Not met Long Term Goal 3: Pt will be independent with transfer from floor to standing for improved safety in case of falls while alone.  Long Term Goal 3 Progress: Not met  Problem List Patient Active Problem List  Diagnosis  . Breast cancer  . Essential hypertension, benign  . DM (diabetes mellitus)  . OA (osteoarthritis)  . Morbidly obese  .  Asthma  . Hyperlipidemia  . Chronic fatigue  . OSA (obstructive sleep apnea)  . Dyspnea  . HSV (herpes simplex virus) infection  . Otalgia  . Anal warts  . Peripheral edema  . Skin rash  . Bronchitis, acute  . Pain in joint, shoulder region  . Rotator cuff syndrome of left  shoulder  . Muscle weakness (generalized)    PT - End of Session Activity Tolerance: Patient tolerated treatment well;Patient limited by fatigue General Behavior During Session: [facility] for tasks performed Cognition: [facility] for tasks performed  GP    [name] 07/18/2012, 5:26 PM

## 2012-07-20 ENCOUNTER — Ambulatory Visit (HOSPITAL_COMMUNITY)
Admission: RE | Admit: 2012-07-20 | Discharge: 2012-07-20 | Disposition: A | Discharge status: Home / Self Care | Payer: Medicare Other | Source: Ambulatory Visit | Attending: Orthopedic Surgery | Admitting: Orthopedic Surgery

## 2012-07-20 ENCOUNTER — Other Ambulatory Visit: Payer: Self-pay | Admitting: Family Medicine

## 2012-07-20 NOTE — Progress Notes (Signed)
Physical Therapy Treatment Patient Details  Name: Julie Clay MRN: [id number] Date of Birth: 20-Jan-1941  Today's Date: 07/20/2012 Time: 4098-1191 PT Time Calculation (min): 39 min Charges: 33' TE Visit#: 5 of 12  Re-eval: 08/04/12 Assessment Diagnosis: Generalized Weakness Next MD Visit: Dr. Jeanice Lim - unscheduled Prior Therapy: Currently OT for L shoulder pain.  Authorization: MEDICARE  Authorization Time Period:    Authorization Visit#: 5 of 10   Subjective: Symptoms/Limitations Symptoms: Pt reports that her balance is doing ok.  She is getting ready for her RTC repair next Wed. She states she is starting to do more activity at home.  Pain Assessment Currently in Pain?: Yes Pain Score:   2 Pain Location: Knee Pain Orientation: Right;Left  Precautions/Restrictions     Exercise/Treatments Aerobic Stationary Bike: NuStep [place] #3, Reistance 2,  x 8 min goal step per minutes greated than 70 Standing Heel Raises: 20 reps Heel Raises Limitations: toe raises 20 reps Knee Flexion: Both;10 reps Functional Squat: 15 reps Other Standing Knee Exercises: standing hip abduction and extension x15 reps each  Seated Long Arc Quad: Both;20 reps;Weights Long Arc Quad Weight: 2 lbs. Other Seated Knee Exercises: Hip abduction blue band x15, hip adduction 10x10 sec holds Other Seated Knee Exercises: Hamstring curls 15 reps BLE blue theraband  Physical Therapy Assessment and Plan PT Assessment and Plan Clinical Impression Statement: Session today focused on improving LE strength in functional positions with pt acting as she had a sling on.  Provided pt with a blue theraband and updated exercises that she will be able to safely complete at home after her surgery.  PT Plan: D/C next visit    Problem List Patient Active Problem List  Diagnosis  . Breast cancer  . Essential hypertension, benign  . DM (diabetes mellitus)  . OA (osteoarthritis)  . Morbidly obese  . Asthma  .  Hyperlipidemia  . Chronic fatigue  . OSA (obstructive sleep apnea)  . Dyspnea  . HSV (herpes simplex virus) infection  . Otalgia  . Anal warts  . Peripheral edema  . Skin rash  . Bronchitis, acute  . Pain in joint, shoulder region  . Rotator cuff syndrome of left shoulder  . Muscle weakness (generalized)    PT - End of Session Activity Tolerance: Patient tolerated treatment well;Patient limited by fatigue General Behavior During Session: [facility] for tasks performed Cognition: [facility] for tasks performed  Pallas Wahlert, PT 07/20/2012, 1:41 PM

## 2012-07-21 ENCOUNTER — Telehealth: Payer: Self-pay | Admitting: Orthopedic Surgery

## 2012-07-21 NOTE — Telephone Encounter (Signed)
Patient has written a note to our office requesting to cancel her 08/17/12 appointment, as she is seeing another orthopedic surgeon in [place], Dr. Christian Mate, and has surgery scheduled.  She requested a copy of her medical records, which has been processed by our records provider [facility].  She also requested a copy of previous records, which had been scanned in to her chart, which were also provided.

## 2012-07-25 ENCOUNTER — Ambulatory Visit (HOSPITAL_COMMUNITY)
Admission: RE | Admit: 2012-07-25 | Discharge: 2012-07-25 | Disposition: A | Discharge status: Home / Self Care | Payer: Medicare Other | Source: Ambulatory Visit | Attending: Orthopedic Surgery | Admitting: Orthopedic Surgery

## 2012-07-25 ENCOUNTER — Ambulatory Visit (HOSPITAL_COMMUNITY): Payer: Medicare Other | Admitting: Occupational Therapy

## 2012-07-25 NOTE — Evaluation (Signed)
Physical Therapy Discharge Summary  Patient Details  Name: Julie Clay MRN: [id number] Date of Birth: 1940/12/30  Today's Date: 07/25/2012 Time: 1527-1558 PT Time Calculation (min): 31 min Charges: 1 MMT, 28' Self Care             Visit#: 6 of 12  Re-eval: 08/04/12 Assessment Diagnosis: Generalized Weakness Next MD Visit: Dr. Jeanice Lim - unscheduled  Authorization: MEDICARE    Authorization Time Period:    Authorization Visit#: 6 of 10   Subjective Symptoms/Limitations Symptoms: Pt reports that she is going to having her RTC surgery tomorrow.  She reports she wishes she was working on her exercises more.  She attempted to purchase exercise equipment however was too smalle.  How long can you stand comfortably?: continues to have difficulty with standing How long can you walk comfortably?: 15 minutes (was 5 minutes) Pain Assessment Currently in Pain?: No/denies  Sensation/Coordination/Flexibility/Functional Tests Functional Tests Functional Tests: 5 sit to stands without UE assit 11 seconds w/o SOB (was 20 sec with SOB))   RLE Strength RLE Overall Strength Comments: hip internal rotation: 3+/5 (was 3/5), external rotation 3+/5 (was 3/5) Right Hip Flexion: 4/5 (was 3+/5) Right Hip Extension: 4/5 (was 3+/5) Right Hip ABduction: 4/5 (was 3+/5) Right Hip ADduction: 4/5 (was 3-/5) Right Knee Flexion: 5/5 (was 5/5) Right Knee Extension: 5/5 (was 5/5) LLE Strength LLE Overall Strength Comments: hip internal rotation: 3/5, external rotation 3/5 Left Hip Flexion: 4/5 (was 3+/5) Left Hip Extension: 4/5 (was 3+/5) Left Hip ABduction: 4/5 (was 3+/5) Left Hip ADduction: 4/5 (was 3-/5) Left Knee Flexion: 5/5 (was 5/5) Left Knee Extension: 5/5 (was 5/2)  Mobility/Balance  Ambulation/Gait Ambulation/Gait: Yes Ambulation Distance (Feet): 476 Feet (2 minutes, Borg: 4/10) Static Standing Balance Single Leg Stance - Right Leg: 10 (was 7) Single Leg Stance - Left Leg: 30 (was 10) Tandem  Stance - Right Leg: 60 (was 10) Tandem Stance - Left Leg: 60 (was 10) Rhomberg - Eyes Opened: 60 (was 10) [place]omberg - Eyes Closed: 60 (was 10)   Physical Therapy Assessment and Plan PT Assessment and Plan Clinical Impression Statement: Ms. Carlson has attended 6 OP PT visits to address balance disorder with personal hx of falls with the following findings: she has made slight gains in strength and with LLE balance and improved Borg scale raiting with ambulating further with greater gait speed, continues to have greatest difficulty with RLE balance.  Pt able to independently demonstrate fall recovery and is reluctant to do so due to pain tp BLE knees , At this time will d/c from PT due to RTC surgery tomorrow.  Pt is willing to continue in a few months after she heals from her RTC repair.  PT Plan: D/C    Goals Home Exercise Program Pt will Perform Home Exercise Program: Independently Progressing toward goal PT Short Term Goals: 3 weeks PT Short Term Goal 1: Pt will improve her BLE strength in order to ambulate independently x10 minutes. : Met PT Short Term Goal 2: Pt will demonstrate 5 sit to stands in 12.7 seconds for approrpriate age.  (11 sec ) Met PT Long Term Goals: (6 weeks) PT Long Term Goal 1: Pt will improve her diaphragmatic breathing and present with 3/10 dyspena on the Borg Scale after walking for 5 minutes. : Progressing toward goal PT Long Term Goal 2: Pt will improve her gait speed and ambulate 588 feet in 2 minutes for appropriate age adjusted walking distance for improved safety while walking in the community.  (  476 in 2 minutes) Progressing toward goal Long Term Goal 3: Pt will be independent with transfer from floor to standing for improved safety in case of falls while alone.  Long Term Goal 3 Progress: Progressing toward goal  Problem List Patient Active Problem List  Diagnosis  . Breast cancer  . Essential hypertension, benign  . DM (diabetes mellitus)  . OA  (osteoarthritis)  . Morbidly obese  . Asthma  . Hyperlipidemia  . Chronic fatigue  . OSA (obstructive sleep apnea)  . Dyspnea  . HSV (herpes simplex virus) infection  . Otalgia  . Anal warts  . Peripheral edema  . Skin rash  . Bronchitis, acute  . Pain in joint, shoulder region  . Rotator cuff syndrome of left shoulder  . Muscle weakness (generalized)    PT - End of Session Activity Tolerance: Patient tolerated treatment well;Patient limited by fatigue General Behavior During Session: [facility] for tasks performed Cognition: [facility] for tasks performed PT Plan of Care PT Patient Instructions: discussed continuing with HEP, discussed goals, answered remaining questins.   GP Functional Assessment Tool Used: clinical observation  Functional Limitation: Mobility: Walking and moving around Mobility: Walking and Moving Around Goal Status [phone]): At least 1 percent but less than 20 percent impaired, limited or restricted Mobility: Walking and Moving Around Discharge Status [phone]): At least 20 percent but less than 40 percent impaired, limited or restricted  Chrisotpher Rivero, PT 07/25/2012, 4:12 PM  Physician Documentation Your signature is required to indicate approval of the treatment plan as stated above.  Please sign and either send electronically or make a copy of this report for your files and return this physician signed original.   Please mark one 1.__approve of plan  2. ___approve of plan with the following conditions.   ______________________________                                                          _____________________ Physician Signature                                                                                                             Date

## 2012-07-26 DIAGNOSIS — M75120 Complete rotator cuff tear or rupture of unspecified shoulder, not specified as traumatic: Secondary | ICD-10-CM | POA: Diagnosis not present

## 2012-07-26 DIAGNOSIS — G8918 Other acute postprocedural pain: Secondary | ICD-10-CM | POA: Diagnosis not present

## 2012-07-26 DIAGNOSIS — M24119 Other articular cartilage disorders, unspecified shoulder: Secondary | ICD-10-CM | POA: Diagnosis not present

## 2012-07-26 DIAGNOSIS — M19019 Primary osteoarthritis, unspecified shoulder: Secondary | ICD-10-CM | POA: Diagnosis not present

## 2012-07-26 DIAGNOSIS — M75110 Incomplete rotator cuff tear or rupture of unspecified shoulder, not specified as traumatic: Secondary | ICD-10-CM | POA: Diagnosis not present

## 2012-07-26 HISTORY — PX: SHOULDER ARTHROSCOPY WITH OPEN ROTATOR CUFF REPAIR: SHX6092

## 2012-07-27 ENCOUNTER — Ambulatory Visit (HOSPITAL_COMMUNITY): Payer: Medicare Other

## 2012-08-03 ENCOUNTER — Other Ambulatory Visit: Payer: Self-pay | Admitting: Family Medicine

## 2012-08-17 ENCOUNTER — Ambulatory Visit: Payer: Medicare Other | Admitting: Orthopedic Surgery

## 2012-08-18 DIAGNOSIS — Q828 Other specified congenital malformations of skin: Secondary | ICD-10-CM | POA: Diagnosis not present

## 2012-08-18 DIAGNOSIS — M79609 Pain in unspecified limb: Secondary | ICD-10-CM | POA: Diagnosis not present

## 2012-08-29 ENCOUNTER — Other Ambulatory Visit: Payer: Self-pay | Admitting: Family Medicine

## 2012-08-30 ENCOUNTER — Ambulatory Visit (HOSPITAL_COMMUNITY)
Admission: RE | Admit: 2012-08-30 | Discharge: 2012-08-30 | Disposition: A | Discharge status: Home / Self Care | Payer: Medicare Other | Source: Ambulatory Visit | Attending: Orthopedic Surgery | Admitting: Orthopedic Surgery

## 2012-08-30 DIAGNOSIS — IMO0001 Reserved for inherently not codable concepts without codable children: Secondary | ICD-10-CM | POA: Diagnosis not present

## 2012-08-30 DIAGNOSIS — M75102 Unspecified rotator cuff tear or rupture of left shoulder, not specified as traumatic: Secondary | ICD-10-CM

## 2012-08-30 DIAGNOSIS — M25519 Pain in unspecified shoulder: Secondary | ICD-10-CM | POA: Diagnosis not present

## 2012-08-30 DIAGNOSIS — M6281 Muscle weakness (generalized): Secondary | ICD-10-CM | POA: Diagnosis not present

## 2012-08-30 DIAGNOSIS — I10 Essential (primary) hypertension: Secondary | ICD-10-CM | POA: Insufficient documentation

## 2012-08-30 DIAGNOSIS — M25512 Pain in left shoulder: Secondary | ICD-10-CM

## 2012-08-30 NOTE — Evaluation (Signed)
Occupational Therapy Evaluation  Patient Details  Name: Julie Clay MRN: [id number] Date of Birth: 1940/06/21  Today's Date: 08/30/2012 Time: 1520-1600 OT Time Calculation (min): 40 min OT Evaluation 1520-1535 15' Manual therapy 1535-1600 25' Visit#: 1 of 24  Re-eval: 09/27/12  Assessment Diagnosis: S/P Left RCR, SAD, DCE Surgical Date: 07/26/12 Next MD Visit: 09/01/12 Prior Therapy: one month of OT in March  Authorization: Medicare  Authorization Time Period: before 10th visit  Authorization Visit#: 1 of 10   Past Medical History:  Past Medical History  Diagnosis Date  . Hypertension   . Hyperlipidemia   . Diabetes mellitus   . Asthma     spirometry (2011)- mild ventilary defect  . Cancer 2012    Breast  . Arthritis     Osteoarthritis  . Fatty liver 2013    enlarged  . Sliding hiatal hernia   . Ventral hernia, unspecified, without mention of obstruction or gangrene   . Lung nodule seen on imaging study 2013  . HSV (herpes simplex virus) infection    Past Surgical History:  Past Surgical History  Procedure Laterality Date  . Cesarean section    . 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 Breast surgery  jan 2013  . Hand ligament reconstruction    . Cataract extraction, bilateral      on different occassions     Subjective S:  I had surgery a month ago.  It doesnt hurt like it used to. Pertinent History: Julie Clay states that she has experienced pain and decreased mobility in her left shoulder for several years.  During the past month, her symptoms have gotten worse.  She feels the increased pain and decreased mobility could be attributed to tension from a road trip in bad weather, jerking her arm back after she was bit by a dog, or flailing her arms to prevent losing her balance.  She consulted with Dr. Romeo Apple and had an xray and a cortisone injection., and recieved occupational therapy from 3/10-3/27.  Her pain did not improve, and she gained a second  opinion from Dr. Madelon Lips.  She had surgery on 07/26/12 for an open L RCR,DCE, and SAD.  She has been referred to occupational therapy, following Dr. Encarnacion Slates protocol. Limitations: Protocol (see scanned for full details)  through 09/06/12, PROM flexion and abduction no limit, ER performed at 0 -80 abdcution.  09/06/12 AAROM-AROM as tolerated. Special Tests: UEFI scored 23/80 = 29% Patient Stated Goals: I want full range of motion. Pain Assessment Currently in Pain?: Yes Pain Score:   4 Pain Location: Shoulder Pain Orientation: Left Pain Type: Acute pain  Precautions/Restrictions  Precautions Precaution Comments: Hx of Lt sided breast cancer 1 year ago.   Balance Screening Balance Screen Has the patient fallen in the past 6 months: No Has the patient had a decrease in activity level because of a fear of falling? : Yes Is the patient reluctant to leave their home because of a fear of falling? 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  Assessment ADL/Vision/Perception ADL ADL Comments: unable to use her left arm at all with any functional activities, uncomfortable sleeping,  Dominant Hand: Right Vision - History Baseline Vision: Wears glasses only for reading  Cognition/Observation Cognition Overall Cognitive Status: Within Functional Limits for tasks assessed  Sensation/Coordination/Edema Sensation  Light Touch: Appears Intact Coordination Gross Motor Movements are Fluid and Coordinated: Yes Fine Motor Movements are Fluid and Coordinated: Yes  Additional Assessments LUE AROM (degrees) LUE Overall AROM Comments: not assessed due to recent surgery LUE PROM (degrees) LUE Overall PROM Comments: assessed in supine, ER/IR with shoulder adducted  Left  Shoulder Flexion: 55 Degrees Left Shoulder ABduction: 90 Degrees Left Shoulder Internal Rotation: 85 Degrees Left Shoulder External Rotation: 28 Degrees LUE Strength LUE Overall Strength Comments: not assessed due to recent surgery Palpation Palpation: mod-max fascial restrictions     Exercise/Treatments    Manual Therapy Manual Therapy: Myofascial release Myofascial Release: Myofascial release and manual stretching to left upper arm, scapular area, lateral upper arm and upper trap to decrease pain and fascial restrictions and increase pain free mobility in left shoulder  Occupational Therapy Assessment and Plan OT Assessment and Plan Clinical Impression Statement: A:  Patient is a 72 year old female with decreased functional use of her LUE S/P RCR due to increased pain and restrictions and decreased A/PROM and strength.  Patient will benefit from skilled OT intervention to increase functional use of LUE.    Pt will benefit from skilled therapeutic intervention in order to improve on the following deficits: Decreased range of motion;Decreased strength;Increased fascial restricitons;Increased muscle spasms;Pain Rehab Potential: Good OT Frequency: Min 2X/week OT Duration: 12 weeks OT Treatment/Interventions: Self-care/ADL training;Therapeutic exercise;Modalities;Manual therapy;Therapeutic activities;Patient/family education OT Plan: Skilled OT intervention to increase pain free mobility and decrease restrictions in left shoulder region for greater independence with daily activities.  Treatment Plan: Follow Protocol,  MFR and manual stretching in supine.  Therapeutic Exercises:  PROM, progress to AAROM ball stretches, shoulder shrugs, isometrics, scapular stability, elv and retraction with weight, supine serratus punch with weight, tband IR.ER, extension, retraction,  thumb tacks,   Goals Short Term Goals Time to Complete Short Term Goals: 6 weeks Short Term Goal 1: Patient will be educated  on a HEP. Short Term Goal 2: Patient will increase left shoulder PROM bto WFL for for greater ease with tucking her shirts in. Short Term Goal 3: Patient will increase her left shoulder strength to 3+/5 for increased ability to lift pots and pans when cooking.  Short Term Goal 4: Patient will decrease fascial restrictions from moderate-max to moderate for greater mobility needed for personal hygiene completion. Short Term Goal 5: Patient will decrease pain to 3/10 in her left shoulder for increased abilty to sleep at night.  Long Term Goals Time to Complete Long Term Goals: 12 weeks Long Term Goal 1: Patient will return to prior level of independence with all functional and desired leisure activities.  Long Term Goal 2: Patient will increase left shoulder AROM to WNL for increased abilty to complete personal hygiene. Long Term Goal 3: Patient will increase left shoulder strength to 5/5 for increased ability to lift pots and pans when cooking.  Long Term Goal 4: Patient will decrease fascial restrictions to minimal in her left shoulder region for greater mobility required to pull up pants.  Long Term Goal 5: Patient will decrease pain in her left shoulder region to 1/10 when completing desired daily activities.   Problem List Patient Active Problem List   Diagnosis Date Noted  . Occupational therapy encounter 08/30/2012  . Muscle weakness (generalized) 06/26/2012  . Rotator cuff syndrome of left shoulder 06/21/2012  . Pain in joint, shoulder region 06/16/2012  . Bronchitis, acute 04/28/2012  . Skin rash 02/24/2012  . Anal warts 02/04/2012  . Peripheral  edema 02/04/2012  . Otalgia 01/14/2012  . HSV (herpes simplex virus) infection 12/09/2011  . Dyspnea 11/15/2011  . Breast cancer 10/11/2011  . Essential hypertension, benign 10/11/2011  . DM (diabetes mellitus) 10/11/2011  . OA (osteoarthritis) 10/11/2011  . Morbidly obese 10/11/2011  . Asthma 10/11/2011  . Hyperlipidemia 10/11/2011  .  Chronic fatigue 10/11/2011  . OSA (obstructive sleep apnea) 10/11/2011    General Behavior During Therapy: [facility] for tasks assessed/performed Cognition: [facility] for tasks performed OT Plan of Care OT Home Exercise Plan: PROM by husband and towel slides, reviewed pendulums given by MD. Earlyne Iba and Agree with Plan of Care: Patient  GO Functional Assessment Tool Used: UEFI scored 23/80 = 29% I level and 71% impairment level Functional Limitation: Carrying, moving and handling objects Carrying, Moving and Handling Objects Current Status (A5409): At least 60 percent but less than 80 percent impaired, limited or restricted Carrying, Moving and Handling Objects Goal Status [phone]): At least 1 percent but less than 20 percent impaired, limited or restricted  Shirlean Mylar, OTR/L  08/30/2012, 10:36 PM  Physician Documentation Your signature is required to indicate approval of the treatment plan as stated above.  Please sign and either send electronically or make a copy of this report for your files and return this physician signed original.  Please mark one 1.__approve of plan  2. ___approve of plan with the following conditions.   ______________________________                                                          _____________________ Physician Signature                                                                                                             Date

## 2012-08-31 ENCOUNTER — Ambulatory Visit (HOSPITAL_COMMUNITY)
Admission: RE | Admit: 2012-08-31 | Discharge: 2012-08-31 | Disposition: A | Discharge status: Home / Self Care | Payer: Medicare Other | Source: Ambulatory Visit | Attending: Family Medicine | Admitting: Family Medicine

## 2012-08-31 DIAGNOSIS — M25519 Pain in unspecified shoulder: Secondary | ICD-10-CM | POA: Diagnosis not present

## 2012-08-31 DIAGNOSIS — IMO0001 Reserved for inherently not codable concepts without codable children: Secondary | ICD-10-CM | POA: Diagnosis not present

## 2012-08-31 DIAGNOSIS — M6281 Muscle weakness (generalized): Secondary | ICD-10-CM | POA: Diagnosis not present

## 2012-08-31 DIAGNOSIS — I10 Essential (primary) hypertension: Secondary | ICD-10-CM | POA: Diagnosis not present

## 2012-08-31 NOTE — Progress Notes (Signed)
Occupational Therapy Treatment Patient Details  Name: Julie Clay MRN: [id number] Date of Birth: 1940/09/18  Today's Date: 08/31/2012 Time: 6962-9528 OT Time Calculation (min): 30 min Manual Therapy 4132-4401 18' Therapeutic Exercises [phone] 12' Visit#: 2 of 24  Re-eval: 09/27/12    Authorization: Medicare  Authorization Time Period: before 10th visit  Authorization Visit#: 2 of 10  Subjective Symptoms/Limitations Symptoms: S:  After I got home yesterday, I had to take 2 vicoden Limitations: Protocol (see scanned for full details)  through 09/06/12, PROM flexion and abduction no limit, ER performed at 0 -80 abdcution.  09/06/12 AAROM-AROM as tolerated. Pain Assessment Currently in Pain?: No/denies Pain Score: 0-No pain  Precautions/Restrictions    Protocol (see scanned for full details)  through 09/06/12, PROM flexion and abduction no limit, ER performed at 0 -80 abdcution.  09/06/12 AAROM-AROM as tolerated.  Exercise/Treatments Supine Protraction: PROM;10 reps Horizontal ABduction: PROM;10 reps External Rotation: PROM;10 reps Internal Rotation: PROM;10 reps Flexion: PROM;10 reps ABduction: PROM;10 reps Other Supine Exercises: bridging X 15 Seated Elevation: AROM;10 reps Extension: AROM;10 reps Row: AROM;10 reps Other Seated Exercises: elbow flexion, extension, supination, pronation, wrist flexion, extension AROM 10 times Therapy Ball Flexion: 15 reps ABduction: 15 reps ROM / Strengthening / Isometric Strengthening   Flexion: Supine;3X3" Extension: Supine;3X3" External Rotation: Supine;3X3" Internal Rotation: Supine;3X3" ABduction: Supine;3X3" ADduction: Supine;3X3"    Manual Therapy Manual Therapy: Myofascial release Myofascial Release: Myofascial release and manual stretching to left upper arm, scapular area, lateral upper arm and upper trap to decrease pain and fascial restrictions and increase pain free mobility in left shoulder  Occupational Therapy  Assessment and Plan OT Assessment and Plan Clinical Impression Statement: A:  Tolerated PROM to limits of protocol, most pain with horizontal adduction and and adduction.  OT Plan: P:  Increase PROM to [facility] and add pulleys.  elev and retraction with weight, supine serratus punch with weight, tband IR.ER, extension, retraction,  thumb tacks,     Goals Short Term Goals Time to Complete Short Term Goals: 6 weeks Short Term Goal 1: Patient will be educated on a HEP. Short Term Goal 1 Progress: Progressing toward goal Short Term Goal 2: Patient will increase left shoulder PROM bto WFL for for greater ease with tucking her shirts in. Short Term Goal 2 Progress: Progressing toward goal Short Term Goal 3: Patient will increase her left shoulder strength to 3+/5 for increased ability to lift pots and pans when cooking.  Short Term Goal 3 Progress: Progressing toward goal Short Term Goal 4: Patient will decrease fascial restrictions from moderate-max to moderate for greater mobility needed for personal hygiene completion. Short Term Goal 4 Progress: Progressing toward goal Short Term Goal 5: Patient will decrease pain to 3/10 in her left shoulder for increased abilty to sleep at night.  Short Term Goal 5 Progress: Progressing toward goal Long Term Goals Time to Complete Long Term Goals: 12 weeks Long Term Goal 1: Patient will return to prior level of independence with all functional and desired leisure activities.  Long Term Goal 1 Progress: Progressing toward goal Long Term Goal 2: Patient will increase left shoulder AROM to WNL for increased abilty to complete personal hygiene. Long Term Goal 2 Progress: Progressing toward goal Long Term Goal 3: Patient will increase left shoulder strength to 5/5 for increased ability to lift pots and pans when cooking.  Long Term Goal 3 Progress: Progressing toward goal Long Term Goal 4: Patient will decrease fascial restrictions to minimal in her left shoulder  region for greater mobility required to pull up pants.  Long Term Goal 4 Progress: Progressing toward goal Long Term Goal 5: Patient will decrease pain in her left shoulder region to 1/10 when completing desired daily activities.  Long Term Goal 5 Progress: Progressing toward goal  Problem List Patient Active Problem List   Diagnosis Date Noted  . Occupational therapy encounter 08/30/2012  . Muscle weakness (generalized) 06/26/2012  . Rotator cuff syndrome of left shoulder 06/21/2012  . Pain in joint, shoulder region 06/16/2012  . Bronchitis, acute 04/28/2012  . Skin rash 02/24/2012  . Anal warts 02/04/2012  . Peripheral edema 02/04/2012  . Otalgia 01/14/2012  . HSV (herpes simplex virus) infection 12/09/2011  . Dyspnea 11/15/2011  . Breast cancer 10/11/2011  . Essential hypertension, benign 10/11/2011  . DM (diabetes mellitus) 10/11/2011  . OA (osteoarthritis) 10/11/2011  . Morbidly obese 10/11/2011  . Asthma 10/11/2011  . Hyperlipidemia 10/11/2011  . Chronic fatigue 10/11/2011  . OSA (obstructive sleep apnea) 10/11/2011    End of Session Activity Tolerance: Patient tolerated treatment well General Behavior During Therapy: [facility] for tasks assessed/performed Cognition: WFL for tasks performed  GO    Shirlean Mylar, OTR/L  08/31/2012, 4:41 PM

## 2012-09-08 ENCOUNTER — Ambulatory Visit (HOSPITAL_COMMUNITY)
Admission: RE | Admit: 2012-09-08 | Discharge: 2012-09-08 | Disposition: A | Discharge status: Home / Self Care | Payer: Medicare Other | Source: Ambulatory Visit | Attending: Orthopedic Surgery | Admitting: Orthopedic Surgery

## 2012-09-08 ENCOUNTER — Encounter: Payer: Self-pay | Admitting: Family Medicine

## 2012-09-08 ENCOUNTER — Ambulatory Visit (INDEPENDENT_AMBULATORY_CARE_PROVIDER_SITE_OTHER): Payer: Medicare Other | Admitting: Family Medicine

## 2012-09-08 VITALS — BP 132/60 | HR 100 | Resp 20 | Ht 65.5 in | Wt 253.1 lb

## 2012-09-08 DIAGNOSIS — E785 Hyperlipidemia, unspecified: Secondary | ICD-10-CM | POA: Diagnosis not present

## 2012-09-08 DIAGNOSIS — E119 Type 2 diabetes mellitus without complications: Secondary | ICD-10-CM

## 2012-09-08 DIAGNOSIS — I10 Essential (primary) hypertension: Secondary | ICD-10-CM | POA: Diagnosis not present

## 2012-09-08 DIAGNOSIS — IMO0001 Reserved for inherently not codable concepts without codable children: Secondary | ICD-10-CM

## 2012-09-08 DIAGNOSIS — M25559 Pain in unspecified hip: Secondary | ICD-10-CM | POA: Diagnosis not present

## 2012-09-08 DIAGNOSIS — M25552 Pain in left hip: Secondary | ICD-10-CM

## 2012-09-08 DIAGNOSIS — M25519 Pain in unspecified shoulder: Secondary | ICD-10-CM | POA: Diagnosis not present

## 2012-09-08 DIAGNOSIS — M6281 Muscle weakness (generalized): Secondary | ICD-10-CM | POA: Diagnosis not present

## 2012-09-08 NOTE — Assessment & Plan Note (Signed)
Blood pressure looks good no further changes

## 2012-09-08 NOTE — Assessment & Plan Note (Signed)
She has not lost any significant weight though she is concerned about her large pannus I advised her after weight loss it would have to be surgically removed

## 2012-09-08 NOTE — Progress Notes (Signed)
  Subjective:    Patient ID: Julie Clay, female    DOB: 1940-06-26, 72 y.o.   MRN: [id number]  HPI  Patient to follow chronic medical problems. She has multiple concerns. She is having hip pain for the past 3 weeks she noticed it after she had her shoulder put in a sling after surgery. She's not sure she's been laying wrong however after she gets up and a morning or she is sitting in a chair for long periods of time she is very stiff and she gets a sharp pain and occasionally some numbness in the area on her left hip. She denies any radiation of the pain. She denies any back pain. She denies any falls.  Diabetes mellitus her blood sugars have been 130s to 200 fasting she does note after she takes the Januvia that she will feel sick and jittery and breakout Julie Clay she rechecked her sugar and it is low.  She is tolerating the blood pressure medication without any problems.  She would like to find a way to get rid of the excess of weight around her abdomen.  Review of Systems   GEN- denies fatigue, fever, weight loss,weakness, recent illness HEENT- denies eye drainage, change in vision, nasal discharge, CVS- denies chest pain, palpitations RESP- denies SOB, cough, wheeze ABD- denies N/V, change in stools, abd pain GU- denies dysuria, hematuria, dribbling, incontinence MSK- + joint pain, muscle aches, injury Neuro- denies headache, dizziness, syncope, seizure activity      Objective:   Physical Exam GEN- NAD, alert and oriented x3, obese HEENT- PERRL, EOMI, non injected sclera, pink conjunctiva, MMM, oropharynx clear,  Neck- Supple,  CVS- RRR, no murmur RESP-CTAB ABD-NABS, soft,NT,ND, Large pannus MSK- Back Spine NT, neg SLR, HIP- fair IR/ER bilat, TTP palpation below greater trochanter region,difficult due to habitus, Left arm in sling Pulse- Radial 2+  Ext- trace edema pedal          Assessment & Plan:

## 2012-09-08 NOTE — Assessment & Plan Note (Addendum)
A1c 7.1% however she is having some hypoglycemia I will have her decrease the Januvia 25 mg she will continue the metformin and glipizide Urine micro obtained

## 2012-09-08 NOTE — Progress Notes (Signed)
Occupational Therapy Treatment Patient Details  Name: Julie Clay MRN: [id number] Date of Birth: 1940/12/12  Today's Date: 09/08/2012 Time: 1191-4782 OT Time Calculation (min): 35 min Manual Therapy 9562-1308 18' Therapeutic exercises 1205-1222 17' Visit#: 3 of 24  Re-eval: 09/27/12    Authorization: Medicare  Authorization Time Period: before 10th visit  Authorization Visit#: 3 of 10  Subjective Symptoms/Limitations Symptoms: S:  I just got back from my trip. Limitations: Protocol (see scanned for full details)  through 09/06/12, PROM flexion and abduction no limit, ER performed at 0 -80 abdcution.  09/06/12 AAROM-AROM as tolerated. Pain Assessment Currently in Pain?: Yes Pain Score:   3 Pain Location: Shoulder Pain Orientation: Left Pain Type: Acute pain  Precautions/Restrictions    Protocol (see scanned for full details)  through 09/06/12, PROM flexion and abduction no limit, ER performed at 0 -80 abdcution.  09/06/12 AAROM-AROM as tolerated.   Exercise/Treatments Supine Protraction: PROM;10 reps Horizontal ABduction: PROM;10 reps External Rotation: PROM;10 reps;Limitations External Rotation Limitations: PROM at 0 15 and 30 degrees of external rotation 5 repetitions each Internal Rotation: PROM;10 reps Flexion: PROM;10 reps ABduction: PROM;10 reps Other Supine Exercises: bridging X 15 Seated Elevation: AROM;15 reps Extension: AROM;15 reps Row: AROM;15 reps Other Seated Exercises: elbow flexion, extension, supination, pronation, wrist flexion, extension AROM 15 times Therapy Ball Flexion: 20 reps ABduction: 20 reps ROM / Strengthening / Isometric Strengthening Anterior Glide: begin next visit Caudal Glide: begin next visit Flexion: Supine;3X5" Extension: Supine;3X5" External Rotation: Supine;3X5" Internal Rotation: Supine;3X5" ABduction: Supine;3X5" ADduction: Supine;3X5"    Manual Therapy Manual Therapy: Scapular mobilization Myofascial Release:  Myofascial release and manual stretching to left upper arm, scapular area, lateral upper arm and upper trap to decrease pain and fascial restrictions and increase pain free mobility in left shoulder  Occupational Therapy Assessment and Plan OT Assessment and Plan Clinical Impression Statement: A:  Increased PROM, all ranges, this date.   Still uncomfortable without pillow under her upper arm when lying in supine position.  OT Plan: P:  Add gliding exercises, pulleys, tband ir/er, ext, ret.   Goals Short Term Goals Time to Complete Short Term Goals: 6 weeks Short Term Goal 1: Patient will be educated on a HEP. Short Term Goal 1 Progress: Progressing toward goal Short Term Goal 2: Patient will increase left shoulder PROM bto WFL for for greater ease with tucking her shirts in. Short Term Goal 2 Progress: Progressing toward goal Short Term Goal 3: Patient will increase her left shoulder strength to 3+/5 for increased ability to lift pots and pans when cooking.  Short Term Goal 3 Progress: Progressing toward goal Short Term Goal 4: Patient will decrease fascial restrictions from moderate-max to moderate for greater mobility needed for personal hygiene completion. Short Term Goal 4 Progress: Progressing toward goal Short Term Goal 5: Patient will decrease pain to 3/10 in her left shoulder for increased abilty to sleep at night.  Short Term Goal 5 Progress: Progressing toward goal Long Term Goals Time to Complete Long Term Goals: 12 weeks Long Term Goal 1: Patient will return to prior level of independence with all functional and desired leisure activities.  Long Term Goal 1 Progress: Progressing toward goal Long Term Goal 2: Patient will increase left shoulder AROM to WNL for increased abilty to complete personal hygiene. Long Term Goal 2 Progress: Progressing toward goal Long Term Goal 3: Patient will increase left shoulder strength to 5/5 for increased ability to lift pots and pans when  cooking.  Long Term Goal  3 Progress: Progressing toward goal Long Term Goal 4: Patient will decrease fascial restrictions to minimal in her left shoulder region for greater mobility required to pull up pants.  Long Term Goal 4 Progress: Progressing toward goal Long Term Goal 5: Patient will decrease pain in her left shoulder region to 1/10 when completing desired daily activities.  Long Term Goal 5 Progress: Progressing toward goal  Problem List Patient Active Problem List   Diagnosis Date Noted  . Occupational therapy encounter 08/30/2012  . Muscle weakness (generalized) 06/26/2012  . Rotator cuff syndrome of left shoulder 06/21/2012  . Pain in joint, shoulder region 06/16/2012  . Bronchitis, acute 04/28/2012  . Skin rash 02/24/2012  . Anal warts 02/04/2012  . Peripheral edema 02/04/2012  . Otalgia 01/14/2012  . HSV (herpes simplex virus) infection 12/09/2011  . Dyspnea 11/15/2011  . Breast cancer 10/11/2011  . Essential hypertension, benign 10/11/2011  . DM (diabetes mellitus) 10/11/2011  . OA (osteoarthritis) 10/11/2011  . Morbidly obese 10/11/2011  . Asthma 10/11/2011  . Hyperlipidemia 10/11/2011  . Chronic fatigue 10/11/2011  . OSA (obstructive sleep apnea) 10/11/2011    End of Session Activity Tolerance: Patient tolerated treatment well General Behavior During Therapy: [facility] for tasks assessed/performed Cognition: WFL for tasks performed  GO    Shirlean Mylar, OTR/L  09/08/2012, 12:25 PM

## 2012-09-08 NOTE — Patient Instructions (Addendum)
Decrease januvia to 25mg  ( take 1/2 tablet) Start the naprosyn twice a day with food take for at least 4 weeks Call if hip does not improve  Get the labs done 1 week before next visit F/U 3 months [employer]

## 2012-09-08 NOTE — Assessment & Plan Note (Signed)
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 I will just put her on anti-inflammatories twice a day with meals for the next 4 weeks it does not improve I will obtain imaging

## 2012-09-13 ENCOUNTER — Ambulatory Visit (HOSPITAL_COMMUNITY)
Admission: RE | Admit: 2012-09-13 | Discharge: 2012-09-13 | Disposition: A | Discharge status: Home / Self Care | Payer: Medicare Other | Source: Ambulatory Visit | Attending: Orthopedic Surgery | Admitting: Orthopedic Surgery

## 2012-09-13 DIAGNOSIS — IMO0001 Reserved for inherently not codable concepts without codable children: Secondary | ICD-10-CM

## 2012-09-13 DIAGNOSIS — M6281 Muscle weakness (generalized): Secondary | ICD-10-CM | POA: Diagnosis not present

## 2012-09-13 DIAGNOSIS — M25519 Pain in unspecified shoulder: Secondary | ICD-10-CM | POA: Diagnosis not present

## 2012-09-13 DIAGNOSIS — M75102 Unspecified rotator cuff tear or rupture of left shoulder, not specified as traumatic: Secondary | ICD-10-CM

## 2012-09-13 DIAGNOSIS — I10 Essential (primary) hypertension: Secondary | ICD-10-CM | POA: Diagnosis not present

## 2012-09-13 LAB — MICROALBUMIN / CREATININE URINE RATIO: Microalb Creat Ratio: 12 mg/g (ref 0.0–30.0)

## 2012-09-13 NOTE — Progress Notes (Signed)
Occupational Therapy Treatment Patient Details  Name: Julie Clay MRN: [id number] Date of Birth: 06/29/1940  Today's Date: 09/13/2012 Time: 4098-1191 OT Time Calculation (min): 44 min Manual Therapy [id number] 19' Therapeutic Exercises 1415-1440 25' Visit#: 4 of 24  Re-eval: 09/27/12    Authorization: Medicare  Authorization Time Period: before 10th visit  Authorization Visit#: 4 of 10  Subjective S: He told me I dont have to wear the sling all of the time, and I still cant reach overhead. Limitations: Protocol (see scanned for full details)  through 09/06/12, PROM flexion and abduction no limit, ER performed at 0 -80 abdcution.  09/06/12 AAROM-AROM as tolerated. Pain Assessment Currently in Pain?: Yes Pain Score:   3 Pain Location: Shoulder Pain Orientation: Left Pain Type: Acute pain  Precautions/Restrictions     09/06/12 AAROM-AROM as tolerated.  Exercise/Treatments Supine Protraction: PROM;AAROM;10 reps Horizontal ABduction: PROM;AAROM;10 reps External Rotation: PROM;AAROM;10 reps External Rotation Limitations: with moderate facilitation Internal Rotation: PROM;AAROM;10 reps Flexion: PROM;AAROM;10 reps ABduction: PROM;AAROM;10 reps;Limitations ABduction Limitations: with moderate facilitation from OTR/L Therapy Ball Flexion: 20 reps ABduction: 20 reps ROM / Strengthening / Isometric Strengthening Thumb Tacks: attempted, too painful Anterior Glide: dc Caudal Glide: dc Prot/Ret//Elev/Dep: attempt next visit Flexion: Theraband;3X3" Theraband Level (Flexion): Level 2 (Red) Extension: Theraband;3X3" Theraband Level (Extension): Level 2 (Red) External Rotation: Theraband;3X3" Theraband Level (External Rotation): Level 2 (Red) Internal Rotation: Theraband;3X3" Theraband Level (Internal Rotation): Level 2 (Red) ABduction: Theraband;3X3" Theraband Level (ABduction): Level 2 (Red) ADduction: Theraband;3X3" Theraband Level (ADduction): Level 2 (Red)    Manual  Therapy Manual Therapy: Myofascial release Myofascial Release: Myofascial release and manual stretching to left upper arm, scapular area, lateral upper arm and upper trap to decrease pain and fascial restrictions and increase pain free mobility in left shoulder  Occupational Therapy Assessment and Plan OT Assessment and Plan Clinical Impression Statement: A:  Per protocol began AAROM in supine with pain at 90 of flexion and abduction OT Plan: P:  attempt prot/ret//elev/dep and increase AAROM by 10 degrees for flexion and abduction   Goals Short Term Goals Time to Complete Short Term Goals: 6 weeks Short Term Goal 1: Patient will be educated on a HEP. Short Term Goal 1 Progress: Progressing toward goal Short Term Goal 2: Patient will increase left shoulder PROM bto WFL for for greater ease with tucking her shirts in. Short Term Goal 2 Progress: Progressing toward goal Short Term Goal 3: Patient will increase her left shoulder strength to 3+/5 for increased ability to lift pots and pans when cooking.  Short Term Goal 3 Progress: Progressing toward goal Short Term Goal 4: Patient will decrease fascial restrictions from moderate-max to moderate for greater mobility needed for personal hygiene completion. Short Term Goal 4 Progress: Progressing toward goal Short Term Goal 5: Patient will decrease pain to 3/10 in her left shoulder for increased abilty to sleep at night.  Long Term Goals Time to Complete Long Term Goals: 12 weeks Long Term Goal 1: Patient will return to prior level of independence with all functional and desired leisure activities.  Long Term Goal 1 Progress: Progressing toward goal Long Term Goal 2: Patient will increase left shoulder AROM to WNL for increased abilty to complete personal hygiene. Long Term Goal 2 Progress: Progressing toward goal Long Term Goal 3: Patient will increase left shoulder strength to 5/5 for increased ability to lift pots and pans when cooking.  Long  Term Goal 3 Progress: Progressing toward goal Long Term Goal 4: Patient will decrease fascial restrictions  to minimal in her left shoulder region for greater mobility required to pull up pants.  Long Term Goal 4 Progress: Progressing toward goal Long Term Goal 5: Patient will decrease pain in her left shoulder region to 1/10 when completing desired daily activities.  Long Term Goal 5 Progress: Progressing toward goal  Problem List Patient Active Problem List   Diagnosis Date Noted  . Hip pain 09/08/2012  . Occupational therapy encounter 08/30/2012  . Muscle weakness (generalized) 06/26/2012  . Rotator cuff syndrome of left shoulder 06/21/2012  . Pain in joint, shoulder region 06/16/2012  . Bronchitis, acute 04/28/2012  . Skin rash 02/24/2012  . Anal warts 02/04/2012  . Peripheral edema 02/04/2012  . HSV (herpes simplex virus) infection 12/09/2011  . Dyspnea 11/15/2011  . Breast cancer 10/11/2011  . Essential hypertension, benign 10/11/2011  . DM (diabetes mellitus) 10/11/2011  . OA (osteoarthritis) 10/11/2011  . Morbidly obese 10/11/2011  . Asthma 10/11/2011  . Hyperlipidemia 10/11/2011  . Chronic fatigue 10/11/2011  . OSA (obstructive sleep apnea) 10/11/2011    OT Plan of Care OT Home Exercise Plan: AAROM exercises with dowel rod in supine. scanned into medical chart.   GO    Shirlean Mylar, OTR/L  09/13/2012, 3:28 PM

## 2012-09-18 ENCOUNTER — Ambulatory Visit (HOSPITAL_COMMUNITY)
Admission: RE | Admit: 2012-09-18 | Discharge: 2012-09-18 | Disposition: A | Discharge status: Home / Self Care | Payer: Medicare Other | Source: Ambulatory Visit | Attending: Orthopedic Surgery | Admitting: Orthopedic Surgery

## 2012-09-18 DIAGNOSIS — M6281 Muscle weakness (generalized): Secondary | ICD-10-CM | POA: Insufficient documentation

## 2012-09-18 DIAGNOSIS — IMO0001 Reserved for inherently not codable concepts without codable children: Secondary | ICD-10-CM | POA: Insufficient documentation

## 2012-09-18 DIAGNOSIS — E119 Type 2 diabetes mellitus without complications: Secondary | ICD-10-CM | POA: Diagnosis not present

## 2012-09-18 DIAGNOSIS — I10 Essential (primary) hypertension: Secondary | ICD-10-CM | POA: Diagnosis not present

## 2012-09-18 NOTE — Progress Notes (Signed)
Julie Antis, MD [address] [place] [address]  Breast cancer, left  CURRENT THERAPY: Arimidex daily beginning in Feb or March 2013 and she will take that for 5 years.  INTERVAL HISTORY: Julie Clay 72 y.o. 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 She declined radiation therapy at the time of presentation in December 2012.   I personally reviewed and went over laboratory results with the patient.  From a hematologic standpoint, her labs look wonderful, but her diabetes requires better control with a Hgb A1C of 7.1.  I personally reviewed and went over radiographic studies with the patient.  MRI of left shoulder was performed in March 2014.  The full report follows below. There is no evidence of metastatic disease. She underwent arthroscopic intervention in April 2014.  She has healed physically from this intervention but she continues to have some discomfort. She is involved with physical therapy is working her left shoulder. She does have some upper arm swelling on the left and this is likely secondary to arthroscopic surgery. She questions whether this is lymphedema and I suspect it is more secondary to her surgery. She asked me how long the swelling will remain and I have asked her to followup with her surgeon regarding this.  She is tolerating the Arimidex well.  She continues to have some issues with chronic joint pain, but this is not worse on Arimidex.  She denies any complaints associated with Arimidex.  She'll continue with Arimidex daily and this will continue through February or March of 2018.  Oncologically, the patient denies any complaints in order was questioning is negative    Past Medical History  Diagnosis Date  .  Hypertension   . Hyperlipidemia   . Diabetes mellitus   . Asthma     spirometry (2011)- mild ventilary defect  . Cancer 2012    Breast  . Arthritis     Osteoarthritis  . Fatty liver 2013    enlarged  . Sliding hiatal hernia   . Ventral hernia, unspecified, without mention of obstruction or gangrene   . Lung nodule seen on imaging study 2013  . HSV (herpes simplex virus) infection     has Breast cancer; Essential hypertension, benign; DM (diabetes mellitus); OA (osteoarthritis); Morbidly obese; Asthma; Hyperlipidemia; Chronic fatigue; OSA (obstructive sleep apnea); Dyspnea; HSV (herpes simplex virus) infection; Anal warts; Peripheral edema; Skin rash; Bronchitis, acute; Pain in joint, shoulder region; Rotator cuff syndrome of left shoulder; Muscle weakness (generalized); Occupational therapy encounter; and Hip pain on her problem list.     is allergic to adhesive; lisinopril; and tetracyclines & related.  Ms. Genna does not currently have medications on file.  Past Surgical History  Procedure Laterality Date  . Cesarean section    . 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 Breast surgery  jan 2013  . Hand ligament reconstruction    . Cataract extraction, bilateral      on different occassions   . Sholder arthroscopy with open rotator cuff repair Left 07/26/2012    Denies any headaches, dizziness, double vision, fevers, chills, night sweats, nausea, vomiting, diarrhea, constipation, chest pain, heart palpitations, shortness of breath, blood in stool, black tarry stool, urinary pain,  urinary burning, urinary frequency, hematuria.   PHYSICAL EXAMINATION  ECOG PERFORMANCE STATUS: 1 - Symptomatic but completely ambulatory  Filed Vitals:   09/19/12 1400  BP: 130/66  Pulse: 92  Temp: 98.2 F (36.8 C)  Resp: 20    GENERAL:alert, no distress, well nourished, well developed, comfortable, cooperative, obese and smiling SKIN: skin color, texture, turgor are normal, no rashes  or significant lesions HEAD: Normocephalic, No masses, lesions, tenderness or abnormalities EYES: normal, Conjunctiva are pink and non-injected EARS: External ears normal OROPHARYNX:mucous membranes are moist  NECK: supple, no adenopathy, thyroid normal size, non-tender, without nodularity, no stridor, non-tender, trachea midline LYMPH:  no palpable lymphadenopathy BREAST:breasts appear normal, no suspicious masses, no skin or nipple changes or axillary nodes LUNGS: clear to auscultation and percussion HEART: regular rate & rhythm, no murmurs, no gallops, S1 normal and S2 normal ABDOMEN:abdomen soft, non-tender, obese, normal bowel sounds, no masses or organomegaly, difficult to assess for hepatosplenomegaly due to body habitus and no hepatosplenomegaly BACK: Back symmetric, no curvature., right sided thoracic back scar well healed. EXTREMITIES:less then 2 second capillary refill, no skin discoloration, no clubbing, no cyanosis, positive findings:  Left superior shoulder scar that is well healed with mild left upper extremity edema  NEURO: alert & oriented x 3 with fluent speech, no focal motor/sensory deficits, gait normal   LABORATORY DATA: CBC    Component Value Date/Time   WBC 9.2 06/22/2012 0940   RBC 4.64 06/22/2012 0940   HGB 12.0 06/22/2012 0940   HCT 36.9 06/22/2012 0940   PLT 260 06/22/2012 0940   MCV 79.5 06/22/2012 0940   MCH 25.9* 06/22/2012 0940   MCHC 32.5 06/22/2012 0940   RDW 15.3 06/22/2012 0940      Chemistry      Component Value Date/Time   NA 140 06/22/2012 0940   K 4.6 06/22/2012 0940   CL 102 06/22/2012 0940   CO2 28 06/22/2012 0940   BUN 29* 06/22/2012 0940   CREATININE 1.00 06/22/2012 0940      Component Value Date/Time   CALCIUM 10.1 06/22/2012 0940   ALKPHOS 55 06/22/2012 0940   AST 21 06/22/2012 0940   ALT 29 06/22/2012 0940   BILITOT 0.4 06/22/2012 0940        RADIOGRAPHIC STUDIES:  07/13/2012  *RADIOLOGY REPORT*  Clinical Data: Left shoulder pain. Limited range of  motion.  History breast cancer.  MRI OF THE LEFT SHOULDER WITHOUT CONTRAST  Technique: Multiplanar, multisequence MR imaging of the shoulder  was performed. No intravenous contrast was administered.  Comparison: 06/21/2012  FINDINGS:  Rotator cuff: Full-thickness full-width supraspinatus tear noted  with components of the tendon retracted between 1.2 and 2.0 cm.  Suspected partial thickness articular surface tearing of the distal  anterior infraspinatus tendon. Mild subscapularis tendinopathy.  Teres minor tendon intact.  Muscles: Reduced muscle bulk and some fatty atrophy noted in the  supraspinatus tendon. No definite denervation edema. Otherwise  regional muscles intact.  Biceps long head: Mild tendinopathy of the intra-articular segment  Acromioclavicular Joint: AC joint arthropathy noted with fluid in  the [facility] joint, but without overt spurring. The acromial undersurface  is type 3 (hooked).  Glenohumeral Joint: Fluid in the joint communicates with the  subacromial subdeltoid bursa.  Labrum: Superior labral degeneration versus nondisplaced tear.  Bones: Intact  IMPRESSION:  1. Full-thickness supraspinatus tear with mild to moderate  retraction, but with atrophic supraspinatus muscle.  2. Suspected mild partial thickness articular surface tearing of  the distal superior infraspinatus  tendon.  3. Mild subscapularis tendinopathy and mild biceps tendinopathy.  4. Indistinct increased signal in the superior labrum compatible  with degeneration/fraying or nondisplaced labral tear.  5. Degenerative AC joint arthropathy with fluid situated between  the acromion and clavicle. Subtle grade 3 curvature of the  acromial undersurface.  Original Report Authenticated By: Gaylyn Rong, M.D.     ASSESSMENT:  1. Left-sided stage I cancer the breast infiltrating ductal type ER positive, PR positive, HER-2/neu nonamplified with an Oncotype DX score 0 presently on Arimidex which she will  take for 5 years beginning in Feb or March 2013. She declined radiation therapy at the time of presentation in December 2012.  2. Left full-thickness supraspinatus tear with mild partial-thickness superior infraspinatus tear requiring arthroscopic intervention in April 2014 3. Left UE edema, mild, likely secondary to #2.  Patient Active Problem List   Diagnosis Date Noted  . Hip pain 09/08/2012  . Occupational therapy encounter 08/30/2012  . Muscle weakness (generalized) 06/26/2012  . Rotator cuff syndrome of left shoulder 06/21/2012  . Pain in joint, shoulder region 06/16/2012  . Bronchitis, acute 04/28/2012  . Skin rash 02/24/2012  . Anal warts 02/04/2012  . Peripheral edema 02/04/2012  . HSV (herpes simplex virus) infection 12/09/2011  . Dyspnea 11/15/2011  . Breast cancer 10/11/2011  . Essential hypertension, benign 10/11/2011  . DM (diabetes mellitus) 10/11/2011  . OA (osteoarthritis) 10/11/2011  . Morbidly obese 10/11/2011  . Asthma 10/11/2011  . Hyperlipidemia 10/11/2011  . Chronic fatigue 10/11/2011  . OSA (obstructive sleep apnea) 10/11/2011     PLAN:  1. I personally reviewed and went over laboratory results with the patient. 2. I personally reviewed and went over radiographic studies with the patient. 3. Next screening mammogram is due in Feb 2015 4. Continue follow-up with PCP 5. Continue Arimidex daily 6. Recommend continued follow-up with orthopod and physical therapy as directed 7. Return in 6 months for follow-up   All questions were answered. The patient knows to call the clinic with any problems, questions or concerns. We can certainly see the patient much sooner if necessary.  The patient and plan discussed with Glenford Peers, MD and he is in agreement with the aforementioned.  Shelina Luo

## 2012-09-18 NOTE — Progress Notes (Signed)
Occupational Therapy Treatment Patient Details  Name: Julie Clay MRN: [id number] Date of Birth: 10-02-1940  Today's Date: 09/18/2012 Time: 4098-1191 OT Time Calculation (min): 42 min Manual Therapy [id number] 21' Therapeutic Exercises [phone] 21' Visit#: 5 of 24  Re-eval: 09/27/12    Authorization: Medicare  Authorization Time Period: before 10th visit  Authorization Visit#: 5 of 10  Subjective  S:  It didnt hurt too bad driving to [place] and back. Limitations: Protocol (see scanned for full details)  through 09/06/12, PROM flexion and abduction no limit, ER performed at 0 -80 abdcution.  09/06/12 AAROM-AROM as tolerated. Pain Assessment Currently in Pain?: Yes Pain Score:   3 Pain Location: Shoulder Pain Orientation: Left Pain Type: Acute pain  Precautions/Restrictions    Protocol (see scanned for full details)  through 09/06/12, PROM flexion and abduction no limit, ER performed at 0 -80 abdcution.  09/06/12 AAROM-AROM as tolerated. Exercise/Treatments Supine Protraction: PROM;10 reps;AAROM;12 reps Horizontal ABduction: PROM;10 reps;AAROM;12 reps External Rotation: PROM;10 reps;AAROM;12 reps External Rotation Limitations: with moderate facilitation Internal Rotation: PROM;10 reps;AAROM;12 reps Flexion: PROM;10 reps;AAROM;12 reps ABduction: PROM;10 reps;AAROM;12 reps Other Supine Exercises: serratus anterior punch x 12 Pulleys Flexion: 1 minute ABduction: 1 minute Therapy Ball Flexion: 20 reps ABduction: 20 reps ROM / Strengthening / Isometric Strengthening Thumb Tacks: 1' Proximal Shoulder Strengthening, Supine: 10 times each movement, with therapist supporting weight of arm and patient completing volitional movements  Prot/Ret//Elev/Dep: 1'        Manual Therapy Manual Therapy: Myofascial release Myofascial Release: Myofascial release and manual stretching to left upper arm, scapular area, lateral upper arm and upper trap to decrease pain and fascial  restrictions and increase pain free mobility in left shoulder  Occupational Therapy Assessment and Plan OT Assessment and Plan Clinical Impression Statement: A:  Added pulleys, proximal shoulder strengthening in supine, prot/ret/elev/dep, thumbtacks.  requires max vg with proximal shoulder strengthening. OT Plan: P:  Increase AAROM to [facility] for increased independence with BADLs.   Goals Short Term Goals Time to Complete Short Term Goals: 6 weeks Short Term Goal 1: Patient will be educated on a HEP. Short Term Goal 1 Progress: Progressing toward goal Short Term Goal 2: Patient will increase left shoulder PROM bto WFL for for greater ease with tucking her shirts in. Short Term Goal 2 Progress: Progressing toward goal Short Term Goal 3: Patient will increase her left shoulder strength to 3+/5 for increased ability to lift pots and pans when cooking.  Short Term Goal 3 Progress: Progressing toward goal Short Term Goal 4: Patient will decrease fascial restrictions from moderate-max to moderate for greater mobility needed for personal hygiene completion. Short Term Goal 4 Progress: Progressing toward goal Short Term Goal 5: Patient will decrease pain to 3/10 in her left shoulder for increased abilty to sleep at night.  Short Term Goal 5 Progress: Progressing toward goal Long Term Goals Time to Complete Long Term Goals: 12 weeks Long Term Goal 1: Patient will return to prior level of independence with all functional and desired leisure activities.  Long Term Goal 1 Progress: Progressing toward goal Long Term Goal 2: Patient will increase left shoulder AROM to WNL for increased abilty to complete personal hygiene. Long Term Goal 2 Progress: Progressing toward goal Long Term Goal 3: Patient will increase left shoulder strength to 5/5 for increased ability to lift pots and pans when cooking.  Long Term Goal 3 Progress: Progressing toward goal Long Term Goal 4: Patient will decrease fascial  restrictions to minimal in her  left shoulder region for greater mobility required to pull up pants.  Long Term Goal 4 Progress: Progressing toward goal Long Term Goal 5: Patient will decrease pain in her left shoulder region to 1/10 when completing desired daily activities.  Long Term Goal 5 Progress: Progressing toward goal  Problem List Patient Active Problem List   Diagnosis Date Noted  . Hip pain 09/08/2012  . Occupational therapy encounter 08/30/2012  . Muscle weakness (generalized) 06/26/2012  . Rotator cuff syndrome of left shoulder 06/21/2012  . Pain in joint, shoulder region 06/16/2012  . Bronchitis, acute 04/28/2012  . Skin rash 02/24/2012  . Anal warts 02/04/2012  . Peripheral edema 02/04/2012  . HSV (herpes simplex virus) infection 12/09/2011  . Dyspnea 11/15/2011  . Breast cancer 10/11/2011  . Essential hypertension, benign 10/11/2011  . DM (diabetes mellitus) 10/11/2011  . OA (osteoarthritis) 10/11/2011  . Morbidly obese 10/11/2011  . Asthma 10/11/2011  . Hyperlipidemia 10/11/2011  . Chronic fatigue 10/11/2011  . OSA (obstructive sleep apnea) 10/11/2011    End of Session Activity Tolerance: Patient tolerated treatment well General Behavior During Therapy: [facility] for tasks assessed/performed Cognition: WFL for tasks performed OT Plan of Care Consulted and Agree with Plan of Care: Patient  GO    Shirlean Mylar, OTR/L  09/18/2012, 2:23 PM

## 2012-09-19 ENCOUNTER — Encounter (HOSPITAL_COMMUNITY): Payer: Medicare Other | Attending: Oncology | Admitting: Oncology

## 2012-09-19 ENCOUNTER — Encounter (HOSPITAL_COMMUNITY): Payer: Self-pay | Admitting: Oncology

## 2012-09-19 VITALS — BP 130/66 | HR 92 | Temp 98.2°F | Resp 20 | Wt 253.5 lb

## 2012-09-19 DIAGNOSIS — C50912 Malignant neoplasm of unspecified site of left female breast: Secondary | ICD-10-CM

## 2012-09-19 DIAGNOSIS — C50919 Malignant neoplasm of unspecified site of unspecified female breast: Secondary | ICD-10-CM | POA: Diagnosis not present

## 2012-09-19 DIAGNOSIS — R609 Edema, unspecified: Secondary | ICD-10-CM | POA: Diagnosis not present

## 2012-09-19 NOTE — Patient Instructions (Addendum)
[facility] Discharge Instructions  RECOMMENDATIONS MADE BY THE CONSULTANT AND ANY TEST RESULTS WILL BE SENT TO YOUR REFERRING PHYSICIAN.  Continue the Arimidex as prescribed. You will take the Arimidex for a total of 5 years. Return to clinic in 6 months to see MD. Report any issues/concerns to clinic as needed prior to appointment.  Thank you for choosing [name] to provide your oncology and hematology care.  To afford each patient quality time with our providers, please arrive at least 15 minutes before your scheduled appointment time.  With your help, our goal is to use those 15 minutes to complete the necessary work-up to ensure our physicians have the information they need to help with your evaluation and healthcare recommendations.    Effective January 1st, 2014, we ask that you re-schedule your appointment with our physicians should you arrive 10 or more minutes late for your appointment.  We strive to give you quality time with our providers, and arriving late affects you and other patients whose appointments are after yours.    Again, thank you for choosing [facility].  Our hope is that these requests will decrease the amount of time that you wait before being seen by our physicians.       _____________________________________________________________  Should you have questions after your visit to [facility], please contact our office at [phone] between the hours of 8:30 a.m. and 5:00 p.m.  Voicemails left after 4:30 p.m. will not be returned until the following business day.  For prescription refill requests, have your pharmacy contact our office with your prescription refill request.

## 2012-09-21 ENCOUNTER — Ambulatory Visit (HOSPITAL_COMMUNITY)
Admission: RE | Admit: 2012-09-21 | Discharge: 2012-09-21 | Disposition: A | Discharge status: Home / Self Care | Payer: Medicare Other | Source: Ambulatory Visit | Attending: Orthopedic Surgery | Admitting: Orthopedic Surgery

## 2012-09-21 DIAGNOSIS — IMO0001 Reserved for inherently not codable concepts without codable children: Secondary | ICD-10-CM

## 2012-09-21 NOTE — Progress Notes (Signed)
Occupational Therapy Treatment Patient Details  Name: Julie Clay MRN: [id number] Date of Birth: 1941/01/05  Today's Date: 09/21/2012 Time: 3244-0102 OT Time Calculation (min): 47 min Manual Therapy 7253-6644 18' Therapeutic exercises 0347-4259 29' Visit#: 6 of 24  Re-eval: 09/27/12    Authorization: Medicare  Authorization Time Period: before 10th visit  Authorization Visit#: 6 of 10  Subjective Symptoms/Limitations Limitations: Protocol (see scanned for full details)  through 09/06/12, PROM flexion and abduction no limit, ER performed at 0 -80 abdcution.  09/06/12 AAROM-AROM as tolerated. Pain Assessment Currently in Pain?: Yes Pain Score:   2 Pain Location: Shoulder Pain Orientation: Left Pain Type: Acute pain  Precautions/Restrictions    Protocol (see scanned for full details)  through 09/06/12, PROM flexion and abduction no limit, ER performed at 0 -80 abdcution.  09/06/12 AAROM-AROM as tolerated.  Exercise/Treatments Supine Protraction: PROM;10 reps;AAROM;15 reps Horizontal ABduction: PROM;10 reps;AAROM;15 reps External Rotation: PROM;10 reps;AAROM;15 reps Internal Rotation: PROM;10 reps;AAROM;15 reps Flexion: PROM;10 reps;AAROM;15 reps ABduction: PROM;10 reps;AAROM;15 reps Other Supine Exercises: serratus anterior punch x 15 Seated Protraction: 15 reps;AAROM Horizontal ABduction: AAROM;10 reps External Rotation: AAROM;10 reps Internal Rotation: AAROM;10 reps Flexion: AAROM Flexion Limitations: to 90  Abduction: AAROM;10 reps ABduction Limitations: to 90 Pulleys Flexion: 1 minute ABduction: 1 minute Therapy Ball Flexion: 20 reps ABduction: 20 reps ROM / Strengthening / Isometric Strengthening Thumb Tacks: resume next visit Proximal Shoulder Strengthening, Supine: 10 times each movement with minimal facilitation from OTR/L Prot/Ret//Elev/Dep: resume next visit Rhythmic Stabilization, Supine: 10 seconds with arm flexed to 90 and 10 seconds for ext rot and int  rot      Manual Therapy Manual Therapy: Myofascial release Myofascial Release: Myofascial release and manual stretching to left upper arm, scapular area, lateral upper arm and upper trap to decrease pain and fascial restrictions and increase pain free mobility in left shoulder  Occupational Therapy Assessment and Plan OT Assessment and Plan Clinical Impression Statement: A:  Added rhythmic stabilization in supine, 10 seconds each range and dowel rod exercises in seated. OT Plan: P:  Attempt AROM for protraction, ER/IR, and flexion in supine for increased ability to reach overhead when completing functional activities.    Goals Short Term Goals Time to Complete Short Term Goals: 6 weeks Short Term Goal 1: Patient will be educated on a HEP. Short Term Goal 2: Patient will increase left shoulder PROM bto WFL for for greater ease with tucking her shirts in. Short Term Goal 3: Patient will increase her left shoulder strength to 3+/5 for increased ability to lift pots and pans when cooking.  Short Term Goal 4: Patient will decrease fascial restrictions from moderate-max to moderate for greater mobility needed for personal hygiene completion. Short Term Goal 5: Patient will decrease pain to 3/10 in her left shoulder for increased abilty to sleep at night.  Long Term Goals Time to Complete Long Term Goals: 12 weeks Long Term Goal 1: Patient will return to prior level of independence with all functional and desired leisure activities.  Long Term Goal 2: Patient will increase left shoulder AROM to WNL for increased abilty to complete personal hygiene. Long Term Goal 3: Patient will increase left shoulder strength to 5/5 for increased ability to lift pots and pans when cooking.  Long Term Goal 4: Patient will decrease fascial restrictions to minimal in her left shoulder region for greater mobility required to pull up pants.  Long Term Goal 5: Patient will decrease pain in her left shoulder region to  1/10 when  completing desired daily activities.   Problem List Patient Active Problem List   Diagnosis Date Noted  . Hip pain 09/08/2012  . Occupational therapy encounter 08/30/2012  . Muscle weakness (generalized) 06/26/2012  . Rotator cuff syndrome of left shoulder 06/21/2012  . Pain in joint, shoulder region 06/16/2012  . Bronchitis, acute 04/28/2012  . Skin rash 02/24/2012  . Anal warts 02/04/2012  . Peripheral edema 02/04/2012  . HSV (herpes simplex virus) infection 12/09/2011  . Dyspnea 11/15/2011  . Breast cancer 10/11/2011  . Essential hypertension, benign 10/11/2011  . DM (diabetes mellitus) 10/11/2011  . OA (osteoarthritis) 10/11/2011  . Morbidly obese 10/11/2011  . Asthma 10/11/2011  . Hyperlipidemia 10/11/2011  . Chronic fatigue 10/11/2011  . OSA (obstructive sleep apnea) 10/11/2011    End of Session Activity Tolerance: Patient tolerated treatment well General Behavior During Therapy: [facility] for tasks assessed/performed Cognition: WFL for tasks performed  GO    Shirlean Mylar, OTR/L  09/21/2012, 3:24 PM

## 2012-09-25 ENCOUNTER — Ambulatory Visit (HOSPITAL_COMMUNITY)
Admission: RE | Admit: 2012-09-25 | Discharge: 2012-09-25 | Disposition: A | Discharge status: Home / Self Care | Payer: Medicare Other | Source: Ambulatory Visit | Attending: Orthopedic Surgery | Admitting: Orthopedic Surgery

## 2012-09-25 DIAGNOSIS — IMO0001 Reserved for inherently not codable concepts without codable children: Secondary | ICD-10-CM

## 2012-09-25 NOTE — Progress Notes (Signed)
Occupational Therapy Treatment Patient Details  Name: Julie Clay MRN: [id number] Date of Birth: September 21, 1940  Today's Date: 09/25/2012 Time: 4098-1191 OT Time Calculation (min): 40 min MFR 4782-9562 15' Therex 1308-6578 25'  Visit#: 7 of 24  Re-eval: 09/27/12    Authorization: Medicare  Authorization Time Period: before 10th visit  Authorization Visit#: 7 of 10  Subjective Symptoms/Limitations Symptoms: S: it doesn't hurt right now but I did wake up a few times from the pan last night.  Pain Assessment Currently in Pain?: No/denies  Precautions/Restrictions  Precautions Precautions: None  Exercise/Treatments Supine Protraction: PROM;AROM;10 reps Horizontal ABduction: PROM;10 reps;AAROM;15 reps External Rotation: PROM;AROM;10 reps Internal Rotation: PROM;AROM;10 reps Flexion: PROM;10 reps;AAROM;15 reps ABduction: PROM;10 reps;AAROM;15 reps Other Supine Exercises: serratus anterior punch x 15 ROM / Strengthening / Isometric Strengthening Proximal Shoulder Strengthening, Supine: 10X break after first two positions      Manual Therapy Manual Therapy: Myofascial release Myofascial Release: Myofascial release and manual stretching to left upper arm, scapular area, lateral upper arm and upper trap to decrease pain and fascial restrictions and increase pain free mobility in left shoulder  Occupational Therapy Assessment and Plan OT Assessment and Plan Clinical Impression Statement: A: Performed rthymic stabilization without assist from therapist this date. Added AROM supine protraction and IR/ER.     Goals Short Term Goals Time to Complete Short Term Goals: 6 weeks Short Term Goal 1: Patient will be educated on a HEP. Short Term Goal 1 Progress: Progressing toward goal Short Term Goal 2: Patient will increase left shoulder PROM bto WFL for for greater ease with tucking her shirts in. Short Term Goal 2 Progress: Progressing toward goal Short Term Goal 3: Patient will  increase her left shoulder strength to 3+/5 for increased ability to lift pots and pans when cooking.  Short Term Goal 3 Progress: Progressing toward goal Short Term Goal 4: Patient will decrease fascial restrictions from moderate-max to moderate for greater mobility needed for personal hygiene completion. Short Term Goal 4 Progress: Progressing toward goal Short Term Goal 5: Patient will decrease pain to 3/10 in her left shoulder for increased abilty to sleep at night.  Short Term Goal 5 Progress: Progressing toward goal Long Term Goals Time to Complete Long Term Goals: 12 weeks Long Term Goal 1: Patient will return to prior level of independence with all functional and desired leisure activities.  Long Term Goal 1 Progress: Progressing toward goal Long Term Goal 2: Patient will increase left shoulder AROM to WNL for increased abilty to complete personal hygiene. Long Term Goal 2 Progress: Progressing toward goal Long Term Goal 3: Patient will increase left shoulder strength to 5/5 for increased ability to lift pots and pans when cooking.  Long Term Goal 3 Progress: Progressing toward goal Long Term Goal 4: Patient will decrease fascial restrictions to minimal in her left shoulder region for greater mobility required to pull up pants.  Long Term Goal 4 Progress: Progressing toward goal Long Term Goal 5: Patient will decrease pain in her left shoulder region to 1/10 when completing desired daily activities.  Long Term Goal 5 Progress: Progressing toward goal  Problem List Patient Active Problem List   Diagnosis Date Noted  . Hip pain 09/08/2012  . Occupational therapy encounter 08/30/2012  . Muscle weakness (generalized) 06/26/2012  . Rotator cuff syndrome of left shoulder 06/21/2012  . Pain in joint, shoulder region 06/16/2012  . Bronchitis, acute 04/28/2012  . Skin rash 02/24/2012  . Anal warts 02/04/2012  . Peripheral edema  02/04/2012  . HSV (herpes simplex virus) infection  12/09/2011  . Dyspnea 11/15/2011  . Breast cancer 10/11/2011  . Essential hypertension, benign 10/11/2011  . DM (diabetes mellitus) 10/11/2011  . OA (osteoarthritis) 10/11/2011  . Morbidly obese 10/11/2011  . Asthma 10/11/2011  . Hyperlipidemia 10/11/2011  . Chronic fatigue 10/11/2011  . OSA (obstructive sleep apnea) 10/11/2011    End of Session Activity Tolerance: Patient tolerated treatment well General Behavior During Therapy: [facility] for tasks assessed/performed Cognition: [facility] for tasks performed   Limmie Patricia, OTR/L,CBIS   09/25/2012, 1:48 PM

## 2012-09-28 ENCOUNTER — Ambulatory Visit (HOSPITAL_COMMUNITY)
Admission: RE | Admit: 2012-09-28 | Discharge: 2012-09-28 | Disposition: A | Discharge status: Home / Self Care | Payer: Medicare Other | Source: Ambulatory Visit | Attending: Orthopedic Surgery | Admitting: Orthopedic Surgery

## 2012-09-28 DIAGNOSIS — IMO0001 Reserved for inherently not codable concepts without codable children: Secondary | ICD-10-CM

## 2012-09-28 NOTE — Progress Notes (Signed)
Occupational Therapy Treatment Patient Details  Name: Julie Clay MRN: [id number] Date of Birth: 03/16/1941  Today's Date: 09/28/2012 Time: 5621-3086 OT Time Calculation (min): 43 min MFR 1302-1320 18' Therex 5784-6962 25'  Visit#: 8 of 24  Re-eval: 09/27/12    Authorization: Medicare  Authorization Time Period: before 10th visit  Authorization Visit#: 8 of 10  Subjective Symptoms/Limitations Symptoms: S: My shoulder feels ok right now. It'll be a different story when I start moving it.  Pain Assessment Currently in Pain?: No/denies  Precautions/Restrictions  Precautions Precautions: None Precaution Comments: Hx of Lt sided breast cancer 1 year ago.   Exercise/Treatments Supine Protraction: PROM;AROM;10 reps Horizontal ABduction: PROM;10 reps;AAROM;15 reps External Rotation: PROM;AROM;10 reps Internal Rotation: PROM;AROM;10 reps Flexion: PROM;10 reps;AAROM;15 reps ABduction: PROM;10 reps;AAROM;15 reps Other Supine Exercises: serratus anterior punch x 15 Seated Protraction: AAROM;15 reps Horizontal ABduction: AAROM;15 reps External Rotation: AAROM;15 reps Internal Rotation: AAROM;15 reps Flexion: AAROM;15 reps Flexion Limitations: to 90  Abduction: AAROM;15 reps Therapy Ball Flexion: 20 reps ABduction: 20 reps ROM / Strengthening / Isometric Strengthening Thumb Tacks: 1' Proximal Shoulder Strengthening, Supine: 10X no breaks      Manual Therapy Manual Therapy: Myofascial release Myofascial Release: Myofascial release and manual stretching to left upper arm, scapular area, lateral upper arm and upper trap to decrease pain and fascial restrictions and increase pain free mobility in left shoulder  Occupational Therapy Assessment and Plan OT Assessment and Plan Clinical Impression Statement: A: Pain during manual stretching this date. Patient completed exercises with vc's for proper form and technique.  OT Plan: P: Perform all AROM supine when able to  tolerate.   Goals Short Term Goals Time to Complete Short Term Goals: 6 weeks Short Term Goal 1: Patient will be educated on a HEP. Short Term Goal 2: Patient will increase left shoulder PROM bto WFL for for greater ease with tucking her shirts in. Short Term Goal 3: Patient will increase her left shoulder strength to 3+/5 for increased ability to lift pots and pans when cooking.  Short Term Goal 4: Patient will decrease fascial restrictions from moderate-max to moderate for greater mobility needed for personal hygiene completion. Short Term Goal 5: Patient will decrease pain to 3/10 in her left shoulder for increased abilty to sleep at night.  Long Term Goals Time to Complete Long Term Goals: 12 weeks Long Term Goal 1: Patient will return to prior level of independence with all functional and desired leisure activities.  Long Term Goal 2: Patient will increase left shoulder AROM to WNL for increased abilty to complete personal hygiene. Long Term Goal 3: Patient will increase left shoulder strength to 5/5 for increased ability to lift pots and pans when cooking.  Long Term Goal 4: Patient will decrease fascial restrictions to minimal in her left shoulder region for greater mobility required to pull up pants.  Long Term Goal 5: Patient will decrease pain in her left shoulder region to 1/10 when completing desired daily activities.   Problem List Patient Active Problem List   Diagnosis Date Noted  . Hip pain 09/08/2012  . Occupational therapy encounter 08/30/2012  . Muscle weakness (generalized) 06/26/2012  . Rotator cuff syndrome of left shoulder 06/21/2012  . Pain in joint, shoulder region 06/16/2012  . Bronchitis, acute 04/28/2012  . Skin rash 02/24/2012  . Anal warts 02/04/2012  . Peripheral edema 02/04/2012  . HSV (herpes simplex virus) infection 12/09/2011  . Dyspnea 11/15/2011  . Breast cancer 10/11/2011  . Essential hypertension, benign 10/11/2011  .  DM (diabetes mellitus)  10/11/2011  . OA (osteoarthritis) 10/11/2011  . Morbidly obese 10/11/2011  . Asthma 10/11/2011  . Hyperlipidemia 10/11/2011  . Chronic fatigue 10/11/2011  . OSA (obstructive sleep apnea) 10/11/2011    End of Session Activity Tolerance: Patient tolerated treatment well General Behavior During Therapy: [facility] for tasks assessed/performed Cognition: [facility] for tasks performed   Limmie Patricia, OTR/L,CBIS   09/28/2012, 2:17 PM

## 2012-10-02 ENCOUNTER — Ambulatory Visit (HOSPITAL_COMMUNITY)
Admission: RE | Admit: 2012-10-02 | Discharge: 2012-10-02 | Disposition: A | Discharge status: Home / Self Care | Payer: Medicare Other | Source: Ambulatory Visit | Attending: Orthopedic Surgery | Admitting: Orthopedic Surgery

## 2012-10-02 ENCOUNTER — Other Ambulatory Visit: Payer: Self-pay | Admitting: Family Medicine

## 2012-10-02 DIAGNOSIS — IMO0001 Reserved for inherently not codable concepts without codable children: Secondary | ICD-10-CM

## 2012-10-02 NOTE — Telephone Encounter (Signed)
Med refjill

## 2012-10-02 NOTE — Progress Notes (Signed)
Occupational Therapy Treatment Patient Details  Name: Julie Clay MRN: [id number] Date of Birth: [birth year]  Today's Date: 10/02/2012 Time: 7829-5621 OT Time Calculation (min): 41 min Manual Therapy 3086-5784 20' Therapeutic Exercises [phone] 11' ROM/MMT 4132-4401 10' Visit#: 9 of 24  Re-eval: 09/27/12    Authorization: Medicare  Authorization Time Period: before 19th visit  Authorization Visit#: 9 of 19  Subjective Symptoms/Limitations Symptoms: S:  I am having some weird sensations and pain in the front of my shoulder. Limitations: Protocol (see scanned for full details)  through 09/06/12, PROM flexion and abduction no limit, ER performed at 0 -80 abdcution.  09/06/12 AAROM-AROM as tolerated. Pain Assessment Pain Score:   4 Pain Location: Shoulder Pain Orientation: Left Pain Type: Acute pain  Precautions/Restrictions     Exercise/Treatments Supine Protraction: PROM;5 reps;AROM;12 reps Horizontal ABduction: PROM;5 reps;AROM;12 reps External Rotation: PROM;5 reps;AROM;12 reps Internal Rotation: PROM;5 reps;AROM;12 reps Flexion: PROM;5 reps;AROM;12 reps ABduction: PROM;5 reps;AROM;12 reps Other Supine Exercises: serratus anterior punch x 15 Standing External Rotation: Theraband;15 reps Theraband Level (Shoulder External Rotation): Level 2 (Red) Internal Rotation: Theraband;15 reps Theraband Level (Shoulder Internal Rotation): Level 2 (Red) Extension: Theraband;15 reps Theraband Level (Shoulder Extension): Level 2 (Red) Row: Theraband;15 reps Theraband Level (Shoulder Row): Level 2 (Red) Retraction: Theraband;15 reps Theraband Level (Shoulder Retraction): Level 2 (Red) ROM / Strengthening / Isometric Strengthening Thumb Tacks: 1' Proximal Shoulder Strengthening, Supine: 15X without resting between exercises Rhythmic Stabilization, Supine: 10 seconds with arm flexed to 90 and 10 seconds for ext rot and int rot      Manual Therapy Manual Therapy: Myofascial  release Myofascial Release: Myofascial release and manual stretching to left upper arm, scapular area, lateral upper arm and upper trap to decrease pain and fascial restrictions and increase pain free mobility in left shoulder  Occupational Therapy Assessment and Plan OT Assessment and Plan Clinical Impression Statement: A:  Reassessment completed this date:  supine A/PROM current (initial evaluation PROM only):  flexion 135/155 (55), abduction 162/180 (90), external rotation with shoulder adducted 55/60 (28), internal rotation with shoulder adducted 90 (85).  Seated AROM and strength:  flexion 115 3+/5, abduction 95 3+/5, external rotation with shoulder adducted 35 4-/5, internal rotation with shoulder adducted 90 4-/5.   OT Frequency: Min 2X/week OT Duration: 4 weeks OT Plan: P:  Increase reps with AROM in supine, attempt AROM in seated as able.  Add wall wash, prot/ret//elev/dep, ball circles.     Goals Short Term Goals Time to Complete Short Term Goals: 6 weeks Short Term Goal 1: Patient will be educated on a HEP. Short Term Goal 1 Progress: Met Short Term Goal 2: Patient will increase left shoulder PROM bto WFL for for greater ease with tucking her shirts in. Short Term Goal 2 Progress: Met Short Term Goal 3: Patient will increase her left shoulder strength to 3+/5 for increased ability to lift pots and pans when cooking.  Short Term Goal 3 Progress: Met Short Term Goal 4: Patient will decrease fascial restrictions from moderate-max to moderate for greater mobility needed for personal hygiene completion. Short Term Goal 4 Progress: Met Short Term Goal 5: Patient will decrease pain to 3/10 in her left shoulder for increased abilty to sleep at night.  Short Term Goal 5 Progress: Met Long Term Goals Time to Complete Long Term Goals: 12 weeks Long Term Goal 1: Patient will return to prior level of independence with all functional and desired leisure activities.  Long Term Goal 1 Progress:  Progressing toward goal  Long Term Goal 2: Patient will increase left shoulder AROM to WNL for increased abilty to complete personal hygiene. Long Term Goal 2 Progress: Progressing toward goal Long Term Goal 3: Patient will increase left shoulder strength to 5/5 for increased ability to lift pots and pans when cooking.  Long Term Goal 3 Progress: Progressing toward goal Long Term Goal 4: Patient will decrease fascial restrictions to minimal in her left shoulder region for greater mobility required to pull up pants.  Long Term Goal 4 Progress: Progressing toward goal Long Term Goal 5: Patient will decrease pain in her left shoulder region to 1/10 when completing desired daily activities.  Long Term Goal 5 Progress: Progressing toward goal  Problem List Patient Active Problem List   Diagnosis Date Noted  . Hip pain 09/08/2012  . Occupational therapy encounter 08/30/2012  . Muscle weakness (generalized) 06/26/2012  . Rotator cuff syndrome of left shoulder 06/21/2012  . Pain in joint, shoulder region 06/16/2012  . Bronchitis, acute 04/28/2012  . Skin rash 02/24/2012  . Anal warts 02/04/2012  . Peripheral edema 02/04/2012  . HSV (herpes simplex virus) infection 12/09/2011  . Dyspnea 11/15/2011  . Breast cancer 10/11/2011  . Essential hypertension, benign 10/11/2011  . DM (diabetes mellitus) 10/11/2011  . OA (osteoarthritis) 10/11/2011  . Morbidly obese 10/11/2011  . Asthma 10/11/2011  . Hyperlipidemia 10/11/2011  . Chronic fatigue 10/11/2011  . OSA (obstructive sleep apnea) 10/11/2011    End of Session Activity Tolerance: Patient tolerated treatment well General Behavior During Therapy: [facility] for tasks assessed/performed Cognition: WFL for tasks performed  GO Functional Assessment Tool Used: UEFI scored 51/80 = 47% indepedent, 53% impaired.  Previously was 29% independent level. Functional Limitation: Carrying, moving and handling objects Carrying, Moving and Handling Objects  Current Status [phone]): At least 40 percent but less than 60 percent impaired, limited or restricted Carrying, Moving and Handling Objects Goal Status [phone]): At least 1 percent but less than 20 percent impaired, limited or restricted  Jacqualine Code 10/02/2012, 1:56 PM

## 2012-10-05 ENCOUNTER — Ambulatory Visit (HOSPITAL_COMMUNITY)
Admission: RE | Admit: 2012-10-05 | Discharge: 2012-10-05 | Disposition: A | Discharge status: Home / Self Care | Payer: Medicare Other | Source: Ambulatory Visit | Attending: Specialist | Admitting: Specialist

## 2012-10-05 DIAGNOSIS — IMO0001 Reserved for inherently not codable concepts without codable children: Secondary | ICD-10-CM

## 2012-10-05 NOTE — Progress Notes (Signed)
Occupational Therapy Treatment Patient Details  Name: Julie Clay MRN: [id number] Date of Birth: January 07, 1941  Today's Date: 10/05/2012 Time: 1191-4782 OT Time Calculation (min): 43 min MFR 1520-1545 25' Therex 9562-1308 18'  Visit#: 10 of 24  Re-eval: 10/30/12    Authorization: Medicare  Authorization Time Period: before 19th visit  Authorization Visit#: 10 of 19  Subjective Symptoms/Limitations Symptoms: S: My shoulder has been feeling ok.  Pain Assessment Currently in Pain?: No/denies  Precautions/Restrictions  Precautions Precautions: None Precaution Comments: Hx of Lt sided breast cancer 1 year ago.   Exercise/Treatments Supine Protraction: PROM;5 reps;AROM;15 reps Horizontal ABduction: PROM;5 reps;AROM;Limitations Horizontal ABduction Limitations: only able to complete 5X AAROM due to pain External Rotation: PROM;5 reps;AROM;15 reps Internal Rotation: PROM;5 reps;AROM;15 reps Flexion: PROM;5 reps;AROM;15 reps ABduction: PROM;5 reps;AROM;15 reps Other Supine Exercises: serratus anterior punch x 15 ROM / Strengthening / Isometric Strengthening Wall Wash: 1' Prot/Ret//Elev/Dep: 1'      Manual Therapy Manual Therapy: Myofascial release Myofascial Release: Myofascial release and manual stretching to left upper arm, scapular area, lateral upper arm and upper trap to decrease pain and fascial restrictions and increase pain free mobility in left shoulder  Occupational Therapy Assessment and Plan OT Assessment and Plan Clinical Impression Statement: A: Added wall wash and pro/ret/dep/elev. Patient tolerated well. Patient had pain performing AROM horizontal ABD. Only able to complete 5X due to pain. Was able to increase repitions with all supine AROM exercises besides horizontal ABD.  OT Frequency: Min 2X/week OT Duration: 4 weeks OT Plan: P:  Attempt AROM in seated as able.  Add ball circles.     Goals Short Term Goals Time to Complete Short Term Goals: 6  weeks Short Term Goal 1: Patient will be educated on a HEP. Short Term Goal 2: Patient will increase left shoulder PROM bto WFL for for greater ease with tucking her shirts in. Short Term Goal 3: Patient will increase her left shoulder strength to 3+/5 for increased ability to lift pots and pans when cooking.  Short Term Goal 4: Patient will decrease fascial restrictions from moderate-max to moderate for greater mobility needed for personal hygiene completion. Short Term Goal 5: Patient will decrease pain to 3/10 in her left shoulder for increased abilty to sleep at night.  Long Term Goals Time to Complete Long Term Goals: 12 weeks Long Term Goal 1: Patient will return to prior level of independence with all functional and desired leisure activities.  Long Term Goal 2: Patient will increase left shoulder AROM to WNL for increased abilty to complete personal hygiene. Long Term Goal 3: Patient will increase left shoulder strength to 5/5 for increased ability to lift pots and pans when cooking.  Long Term Goal 4: Patient will decrease fascial restrictions to minimal in her left shoulder region for greater mobility required to pull up pants.  Long Term Goal 5: Patient will decrease pain in her left shoulder region to 1/10 when completing desired daily activities.   Problem List Patient Active Problem List   Diagnosis Date Noted  . Hip pain 09/08/2012  . Occupational therapy encounter 08/30/2012  . Muscle weakness (generalized) 06/26/2012  . Rotator cuff syndrome of left shoulder 06/21/2012  . Pain in joint, shoulder region 06/16/2012  . Bronchitis, acute 04/28/2012  . Skin rash 02/24/2012  . Anal warts 02/04/2012  . Peripheral edema 02/04/2012  . HSV (herpes simplex virus) infection 12/09/2011  . Dyspnea 11/15/2011  . Breast cancer 10/11/2011  . Essential hypertension, benign 10/11/2011  . DM (  diabetes mellitus) 10/11/2011  . OA (osteoarthritis) 10/11/2011  . Morbidly obese 10/11/2011  .  Asthma 10/11/2011  . Hyperlipidemia 10/11/2011  . Chronic fatigue 10/11/2011  . OSA (obstructive sleep apnea) 10/11/2011    End of Session Activity Tolerance: Patient tolerated treatment well General Behavior During Therapy: [facility] for tasks assessed/performed Cognition: [facility] for tasks performed   Limmie Patricia, OTR/L,CBIS   10/05/2012, 4:16 PM

## 2012-10-09 ENCOUNTER — Other Ambulatory Visit: Payer: Self-pay | Admitting: Family Medicine

## 2012-10-09 ENCOUNTER — Ambulatory Visit (HOSPITAL_COMMUNITY)
Admission: RE | Admit: 2012-10-09 | Discharge: 2012-10-09 | Disposition: A | Discharge status: Home / Self Care | Payer: Medicare Other | Source: Ambulatory Visit | Attending: Family Medicine | Admitting: Family Medicine

## 2012-10-09 DIAGNOSIS — IMO0001 Reserved for inherently not codable concepts without codable children: Secondary | ICD-10-CM

## 2012-10-09 NOTE — Telephone Encounter (Signed)
Med refilled.

## 2012-10-09 NOTE — Progress Notes (Signed)
Occupational Therapy Treatment Patient Details  Name: Julie Clay MRN: [id number] Date of Birth: 30-Aug-1940  Today's Date: 10/09/2012 Time: 4098-1191 OT Time Calculation (min): 41 min MFR 4782-9562 11' Therex 1308-6578 30'  Visit#: 11 of 24  Re-eval: 10/30/12    Authorization: Medicare  Authorization Time Period: before 19th visit  Authorization Visit#: 11 of 19  Subjective Symptoms/Limitations Symptoms: S: My shoulder feel ok. Just a little bit of pain. Pain Assessment Currently in Pain?: Yes Pain Score:   2 Pain Location: Shoulder Pain Orientation: Left Pain Type: Acute pain  Precautions/Restrictions  Precautions Precautions: None Precaution Comments: Hx of Lt sided breast cancer 1 year ago.   Exercise/Treatments Supine Protraction: PROM;5 reps;AROM;15 reps Horizontal ABduction: PROM;5 reps;AROM;15 reps External Rotation: PROM;5 reps;AROM;15 reps Internal Rotation: PROM;5 reps;AROM;15 reps Flexion: PROM;5 reps;AROM;15 reps ABduction: PROM;5 reps;AROM;15 reps Other Supine Exercises: serratus anterior punch x 15 Seated Protraction: AROM;10 reps Horizontal ABduction: AROM;10 reps External Rotation: AROM;10 reps Internal Rotation: AROM;10 reps Flexion: AROM;10 reps Abduction: AROM;10 reps Standing External Rotation: Theraband;15 reps Theraband Level (Shoulder External Rotation): Level 2 (Red) Internal Rotation: Theraband;15 reps Theraband Level (Shoulder Internal Rotation): Level 2 (Red) Extension: Theraband;15 reps Theraband Level (Shoulder Extension): Level 2 (Red) Row: Theraband;15 reps Theraband Level (Shoulder Row): Level 2 (Red) Retraction: Theraband;15 reps Theraband Level (Shoulder Retraction): Level 2 (Red) Pulleys   Therapy Ball Flexion: 20 reps ABduction: 20 reps Right/Left: 5 reps ROM / Strengthening / Isometric Strengthening Wall Wash: 1' Thumb Tacks: 1' Proximal Shoulder Strengthening, Supine: 15X without resting between exercises      Manual Therapy Manual Therapy: Myofascial release Myofascial Release: Myofascial release and manual stretching to left upper arm, scapular area, lateral upper arm and upper trap to decrease pain and fascial restrictions and increase pain free mobility in left shoulder  Occupational Therapy Assessment and Plan OT Assessment and Plan Clinical Impression Statement: A: Added AROM seated. Patient tolerated well. Patient had limited range when completing most of seated AROM exercises. Patient was able to complete all 15 reps of AROM supine Abduction. OT Frequency: Min 2X/week OT Duration: 4 weeks OT Plan: P: Add UBE bike   Goals Short Term Goals Time to Complete Short Term Goals: 6 weeks Short Term Goal 1: Patient will be educated on a HEP. Short Term Goal 2: Patient will increase left shoulder PROM bto WFL for for greater ease with tucking her shirts in. Short Term Goal 3: Patient will increase her left shoulder strength to 3+/5 for increased ability to lift pots and pans when cooking.  Short Term Goal 4: Patient will decrease fascial restrictions from moderate-max to moderate for greater mobility needed for personal hygiene completion. Short Term Goal 5: Patient will decrease pain to 3/10 in her left shoulder for increased abilty to sleep at night.  Long Term Goals Time to Complete Long Term Goals: 12 weeks Long Term Goal 1: Patient will return to prior level of independence with all functional and desired leisure activities.  Long Term Goal 1 Progress: Progressing toward goal Long Term Goal 2: Patient will increase left shoulder AROM to WNL for increased abilty to complete personal hygiene. Long Term Goal 2 Progress: Progressing toward goal Long Term Goal 3: Patient will increase left shoulder strength to 5/5 for increased ability to lift pots and pans when cooking.  Long Term Goal 3 Progress: Progressing toward goal Long Term Goal 4: Patient will decrease fascial restrictions to minimal  in her left shoulder region for greater mobility required to pull up pants.  Long Term Goal 4 Progress: Progressing toward goal Long Term Goal 5: Patient will decrease pain in her left shoulder region to 1/10 when completing desired daily activities.  Long Term Goal 5 Progress: Progressing toward goal  Problem List Patient Active Problem List   Diagnosis Date Noted  . Hip pain 09/08/2012  . Occupational therapy encounter 08/30/2012  . Muscle weakness (generalized) 06/26/2012  . Rotator cuff syndrome of left shoulder 06/21/2012  . Pain in joint, shoulder region 06/16/2012  . Bronchitis, acute 04/28/2012  . Skin rash 02/24/2012  . Anal warts 02/04/2012  . Peripheral edema 02/04/2012  . HSV (herpes simplex virus) infection 12/09/2011  . Dyspnea 11/15/2011  . Breast cancer 10/11/2011  . Essential hypertension, benign 10/11/2011  . DM (diabetes mellitus) 10/11/2011  . OA (osteoarthritis) 10/11/2011  . Morbidly obese 10/11/2011  . Asthma 10/11/2011  . Hyperlipidemia 10/11/2011  . Chronic fatigue 10/11/2011  . OSA (obstructive sleep apnea) 10/11/2011    End of Session Activity Tolerance: Patient tolerated treatment well General Behavior During Therapy: [facility] for tasks assessed/performed Cognition: [facility] for tasks performed   Limmie Patricia, OTR/L,CBIS   10/09/2012, 1:59 PM

## 2012-10-11 ENCOUNTER — Ambulatory Visit (HOSPITAL_COMMUNITY)
Admission: RE | Admit: 2012-10-11 | Discharge: 2012-10-11 | Disposition: A | Discharge status: Home / Self Care | Payer: Medicare Other | Source: Ambulatory Visit | Attending: Orthopedic Surgery | Admitting: Orthopedic Surgery

## 2012-10-11 DIAGNOSIS — IMO0001 Reserved for inherently not codable concepts without codable children: Secondary | ICD-10-CM

## 2012-10-11 NOTE — Progress Notes (Signed)
Occupational Therapy Treatment Patient Details  Name: Julie Clay MRN: [id number] Date of Birth: 1940/08/03  Today's Date: 10/11/2012 Time: 4098-1191 OT Time Calculation (min): 41 min MFR 4782-9562 11' Therex 1308-6578 30'  Visit#: 12 of 24  Re-eval: 10/30/12    Authorization: Medicare  Authorization Time Period: before 19th visit  Authorization Visit#: 12 of 19  Subjective Symptoms/Limitations Symptoms: S: my shoulder hasn't been bad. Last night I was able to sleep in my own bed all night for the first time.  Pain Assessment Currently in Pain?: No/denies  Precautions/Restrictions  Precautions Precautions: None Precaution Comments: Hx of Lt sided breast cancer 1 year ago.   Exercise/Treatments Supine Protraction: PROM;5 reps;AROM;15 reps Horizontal ABduction: PROM;5 reps;AROM;15 reps External Rotation: PROM;5 reps;AROM;15 reps Internal Rotation: PROM;5 reps;AROM;15 reps Flexion: PROM;5 reps;AROM;15 reps ABduction: PROM;5 reps;AROM;15 reps Other Supine Exercises: serratus anterior punch x 15 Seated Protraction: PROM;12 reps Horizontal ABduction: PROM;12 reps External Rotation: AROM;12 reps Internal Rotation: AROM;12 reps Flexion: AROM;12 reps Abduction: AROM;12 reps Therapy Ball Flexion:  (d/c) ABduction:  (d/c) Right/Left: 5 reps ROM / Strengthening / Isometric Strengthening UBE (Upper Arm Bike): 1.0 3' forward 3' reverse Proximal Shoulder Strengthening, Supine: 15X without resting between exercises Proximal Shoulder Strengthening, Seated: 10X      Manual Therapy Manual Therapy: Myofascial release Myofascial Release: Myofascial release and manual stretching to left upper arm, scapular area, lateral upper arm and upper trap to decrease pain and fascial restrictions and increase pain free mobility in left shoulder  Occupational Therapy Assessment and Plan OT Assessment and Plan Clinical Impression Statement: A: Added proximal shoulder strengthening seated  and UBE bike. Patient tolerated well.  OT Frequency: Min 2X/week OT Duration: 4 weeks OT Plan: P: Give red theraband for HEP with handout. Resume missed exercises.    Goals Short Term Goals Time to Complete Short Term Goals: 6 weeks Short Term Goal 1: Patient will be educated on a HEP. Short Term Goal 2: Patient will increase left shoulder PROM bto WFL for for greater ease with tucking her shirts in. Short Term Goal 3: Patient will increase her left shoulder strength to 3+/5 for increased ability to lift pots and pans when cooking.  Short Term Goal 4: Patient will decrease fascial restrictions from moderate-max to moderate for greater mobility needed for personal hygiene completion. Short Term Goal 5: Patient will decrease pain to 3/10 in her left shoulder for increased abilty to sleep at night.  Long Term Goals Time to Complete Long Term Goals: 12 weeks Long Term Goal 1: Patient will return to prior level of independence with all functional and desired leisure activities.  Long Term Goal 2: Patient will increase left shoulder AROM to WNL for increased abilty to complete personal hygiene. Long Term Goal 3: Patient will increase left shoulder strength to 5/5 for increased ability to lift pots and pans when cooking.  Long Term Goal 4: Patient will decrease fascial restrictions to minimal in her left shoulder region for greater mobility required to pull up pants.  Long Term Goal 5: Patient will decrease pain in her left shoulder region to 1/10 when completing desired daily activities.   Problem List Patient Active Problem List   Diagnosis Date Noted  . Hip pain 09/08/2012  . Occupational therapy encounter 08/30/2012  . Muscle weakness (generalized) 06/26/2012  . Rotator cuff syndrome of left shoulder 06/21/2012  . Pain in joint, shoulder region 06/16/2012  . Bronchitis, acute 04/28/2012  . Skin rash 02/24/2012  . Anal warts 02/04/2012  .  Peripheral edema 02/04/2012  . HSV (herpes  simplex virus) infection 12/09/2011  . Dyspnea 11/15/2011  . Breast cancer 10/11/2011  . Essential hypertension, benign 10/11/2011  . DM (diabetes mellitus) 10/11/2011  . OA (osteoarthritis) 10/11/2011  . Morbidly obese 10/11/2011  . Asthma 10/11/2011  . Hyperlipidemia 10/11/2011  . Chronic fatigue 10/11/2011  . OSA (obstructive sleep apnea) 10/11/2011    End of Session Activity Tolerance: Patient tolerated treatment well General Behavior During Therapy: [facility] for tasks assessed/performed Cognition: [facility] for tasks performed   Limmie Patricia, OTR/L,CBIS   10/11/2012, 1:40 PM

## 2012-10-16 ENCOUNTER — Ambulatory Visit (HOSPITAL_COMMUNITY)
Admission: RE | Admit: 2012-10-16 | Discharge: 2012-10-16 | Disposition: A | Discharge status: Home / Self Care | Payer: Medicare Other | Source: Ambulatory Visit | Attending: Orthopedic Surgery | Admitting: Orthopedic Surgery

## 2012-10-16 DIAGNOSIS — IMO0001 Reserved for inherently not codable concepts without codable children: Secondary | ICD-10-CM

## 2012-10-16 NOTE — Progress Notes (Signed)
Occupational Therapy Treatment Patient Details  Name: Julie Clay MRN: [id number] Date of Birth: 11-Sep-1940  Today's Date: 10/16/2012 Time: 9147-8295 OT Time Calculation (min): 43 min MFR 6213-0865 11' Therex 7846-9629 32'  Visit#: 13 of 24  Re-eval: 10/30/12    Authorization: Medicare  Authorization Time Period: before 19th visit  Authorization Visit#: 13 of 19  Subjective Symptoms/Limitations Symptoms: S: I used my shoulder a lot this weekend and it feels good. Just a little pain in the morning.  Pain Assessment Currently in Pain?: No/denies  Precautions/Restrictions  Precautions Precautions: None Precaution Comments: Hx of Lt sided breast cancer 1 year ago.   Exercise/Treatments Supine Protraction: PROM;5 reps;Strengthening;Weights;10 reps Protraction Weight (lbs): 1 Horizontal ABduction: PROM;5 reps;Strengthening;Weights;10 reps Horizontal ABduction Weight (lbs): 1 External Rotation: PROM;5 reps;Strengthening;Weights;10 reps External Rotation Weight (lbs): 1 Internal Rotation: PROM;5 reps;Strengthening;Weights;10 reps Internal Rotation Weight (lbs): 1 Flexion: PROM;5 reps;Strengthening;Weights;10 reps Shoulder Flexion Weight (lbs): 1 ABduction: PROM;5 reps;Strengthening;Weights;10 reps Shoulder ABduction Weight (lbs): 1 Other Supine Exercises: serratus anterior punch x 10 Seated Protraction: Strengthening;Weights;10 reps Protraction Weight (lbs): 1 Horizontal ABduction: Strengthening;Weights;10 reps Horizontal ABduction Weight (lbs): 1 External Rotation: Strengthening;Weights;10 reps External Rotation Weight (lbs): 1 Internal Rotation: Strengthening;Weights;10 reps Internal Rotation Weight (lbs): 1 Flexion: Strengthening;Weights;10 reps Flexion Weight (lbs): 1 Abduction: Strengthening;Weights;10 reps ABduction Weight (lbs): 1 Standing External Rotation: Theraband;10 reps Theraband Level (Shoulder External Rotation): Level 2 (Red) Internal Rotation:  Theraband;10 reps Theraband Level (Shoulder Internal Rotation): Level 2 (Red) Flexion: Theraband;10 reps Theraband Level (Shoulder Flexion): Level 2 (Red) Extension: Theraband;10 reps Theraband Level (Shoulder Extension): Level 2 (Red) ROM / Strengthening / Isometric Strengthening UBE (Upper Arm Bike): 1.0 3' forward 3' reverse Proximal Shoulder Strengthening, Supine: 15X without resting between exercises Proximal Shoulder Strengthening, Seated: 10X Prot/Ret//Elev/Dep: d/c      Manual Therapy Manual Therapy: Myofascial release Myofascial Release: Myofascial release and manual stretching to left upper arm, scapular area, lateral upper arm and upper trap to decrease pain and fascial restrictions and increase pain free mobility in left shoulder  Occupational Therapy Assessment and Plan OT Assessment and Plan Clinical Impression Statement: A: Added 1# weight to supine and seated exercises. Patient tolerated well with vc's for form and technique. Patient given red theraband for HEP. OT Plan: P: work on increasing reps of supine and seated exercises. Resume missed exercises.    Goals Short Term Goals Time to Complete Short Term Goals: 6 weeks Short Term Goal 1: Patient will be educated on a HEP. Short Term Goal 2: Patient will increase left shoulder PROM bto WFL for for greater ease with tucking her shirts in. Short Term Goal 3: Patient will increase her left shoulder strength to 3+/5 for increased ability to lift pots and pans when cooking.  Short Term Goal 4: Patient will decrease fascial restrictions from moderate-max to moderate for greater mobility needed for personal hygiene completion. Short Term Goal 5: Patient will decrease pain to 3/10 in her left shoulder for increased abilty to sleep at night.  Long Term Goals Time to Complete Long Term Goals: 12 weeks Long Term Goal 1: Patient will return to prior level of independence with all functional and desired leisure activities.  Long  Term Goal 1 Progress: Progressing toward goal Long Term Goal 2: Patient will increase left shoulder AROM to WNL for increased abilty to complete personal hygiene. Long Term Goal 2 Progress: Progressing toward goal Long Term Goal 3: Patient will increase left shoulder strength to 5/5 for increased ability to lift pots and pans when  cooking.  Long Term Goal 3 Progress: Progressing toward goal Long Term Goal 4: Patient will decrease fascial restrictions to minimal in her left shoulder region for greater mobility required to pull up pants.  Long Term Goal 4 Progress: Progressing toward goal Long Term Goal 5: Patient will decrease pain in her left shoulder region to 1/10 when completing desired daily activities.  Long Term Goal 5 Progress: Progressing toward goal  Problem List Patient Active Problem List   Diagnosis Date Noted  . Hip pain 09/08/2012  . Occupational therapy encounter 08/30/2012  . Muscle weakness (generalized) 06/26/2012  . Rotator cuff syndrome of left shoulder 06/21/2012  . Pain in joint, shoulder region 06/16/2012  . Bronchitis, acute 04/28/2012  . Skin rash 02/24/2012  . Anal warts 02/04/2012  . Peripheral edema 02/04/2012  . HSV (herpes simplex virus) infection 12/09/2011  . Dyspnea 11/15/2011  . Breast cancer 10/11/2011  . Essential hypertension, benign 10/11/2011  . DM (diabetes mellitus) 10/11/2011  . OA (osteoarthritis) 10/11/2011  . Morbidly obese 10/11/2011  . Asthma 10/11/2011  . Hyperlipidemia 10/11/2011  . Chronic fatigue 10/11/2011  . OSA (obstructive sleep apnea) 10/11/2011    End of Session Activity Tolerance: Patient tolerated treatment well General Behavior During Therapy: WFL for tasks assessed/performed Cognition: WFL for tasks performed OT Plan of Care OT Home Exercise Plan: red theraband exercises - scanned  OT Patient Instructions: handout Consulted and Agree with Plan of Care: Patient   Limmie Patricia, OTR/L,CBIS   10/16/2012, 1:44  PM

## 2012-10-18 ENCOUNTER — Ambulatory Visit (HOSPITAL_COMMUNITY): Payer: Medicare Other | Admitting: Specialist

## 2012-10-19 ENCOUNTER — Ambulatory Visit (HOSPITAL_COMMUNITY)
Admission: RE | Admit: 2012-10-19 | Discharge: 2012-10-19 | Disposition: A | Discharge status: Home / Self Care | Payer: Medicare Other | Source: Ambulatory Visit | Attending: Orthopedic Surgery | Admitting: Orthopedic Surgery

## 2012-10-19 DIAGNOSIS — I10 Essential (primary) hypertension: Secondary | ICD-10-CM | POA: Diagnosis not present

## 2012-10-19 DIAGNOSIS — IMO0001 Reserved for inherently not codable concepts without codable children: Secondary | ICD-10-CM

## 2012-10-19 DIAGNOSIS — M6281 Muscle weakness (generalized): Secondary | ICD-10-CM | POA: Diagnosis not present

## 2012-10-19 DIAGNOSIS — E119 Type 2 diabetes mellitus without complications: Secondary | ICD-10-CM | POA: Insufficient documentation

## 2012-10-19 NOTE — Progress Notes (Signed)
Occupational Therapy Treatment Patient Details  Name: Julie Clay MRN: [id number] Date of Birth: 07-05-1940  Today's Date: 10/19/2012 Time: 1308-6578 OT Time Calculation (min): 40 min Manual therapy 4696-2952 17' Therapeutic exercise 8413-2440 23' Visit#: 14 of 24  Re-eval: 10/30/12    Authorization: Medicare  Authorization Time Period: before 19th visit  Authorization Visit#: 14 of 19  Subjective S:  Its working better today than it did last week.  Limitations: Protocol (see scanned for full details)  through 09/06/12, PROM flexion and abduction no limit, ER performed at 0 -80 abdcution.  09/06/12 AAROM-AROM as tolerated. Pain Assessment Currently in Pain?: No/denies  Precautions/Restrictions   progress as tolerated  Exercise/Treatments Supine Protraction: PROM;5 reps;Strengthening;Weights;10 reps Protraction Weight (lbs): 1 Horizontal ABduction: PROM;5 reps;Strengthening;Weights;10 reps Horizontal ABduction Weight (lbs): 1 External Rotation: PROM;5 reps;Strengthening;Weights;10 reps External Rotation Weight (lbs): 1 Internal Rotation: PROM;5 reps;Strengthening;Weights;10 reps Internal Rotation Weight (lbs): 1 Flexion: PROM;5 reps;Strengthening;Weights;10 reps Shoulder Flexion Weight (lbs): 1 ABduction: PROM;5 reps;Strengthening;Weights;10 reps Shoulder ABduction Weight (lbs): 1 Other Supine Exercises: serratus anterior punch x 10 Seated Protraction: Strengthening;Weights;10 reps Protraction Weight (lbs): 1 Horizontal ABduction: Strengthening;Weights;10 reps Horizontal ABduction Weight (lbs): 1 External Rotation: Strengthening;Weights;10 reps External Rotation Weight (lbs): 1 Internal Rotation: Strengthening;Weights;10 reps Internal Rotation Weight (lbs): 1 Flexion: Strengthening;Weights;10 reps Flexion Weight (lbs): 1 Abduction: Strengthening;Weights;10 reps ABduction Weight (lbs): 1 Therapy Ball Right/Left:  (dc) ROM / Strengthening / Isometric  Strengthening UBE (Upper Arm Bike): 1.0 3' forward 3' reverse Wall Wash: attempted and had to stop due to blood sugar dropping X to V Arms: completed with left arm only (D2 extension to D2 flexion) Proximal Shoulder Strengthening, Supine: 15X without resting between exercises Proximal Shoulder Strengthening, Seated: 10X Prot/Ret//Elev/Dep: d/c      Manual Therapy Manual Therapy: Myofascial release Myofascial Release: Myofascial release and manual stretching to left upper arm, scapular area, lateral upper arm and upper trap to decrease pain and fascial restrictions and increase pain free mobility in left shoulder  Occupational Therapy Assessment and Plan OT Assessment and Plan Clinical Impression Statement: A:  unable to complete wall wash due to blood sugar dropping.  increased ease with strengthening exercises this date.   OT Plan: P:  Add wall wash and w arm, attempt x to v with bilateral arms.    Goals Short Term Goals Time to Complete Short Term Goals: 6 weeks Short Term Goal 1: Patient will be educated on a HEP. Short Term Goal 2: Patient will increase left shoulder PROM bto WFL for for greater ease with tucking her shirts in. Short Term Goal 3: Patient will increase her left shoulder strength to 3+/5 for increased ability to lift pots and pans when cooking.  Short Term Goal 4: Patient will decrease fascial restrictions from moderate-max to moderate for greater mobility needed for personal hygiene completion. Short Term Goal 5: Patient will decrease pain to 3/10 in her left shoulder for increased abilty to sleep at night.  Long Term Goals Time to Complete Long Term Goals: 12 weeks Long Term Goal 1: Patient will return to prior level of independence with all functional and desired leisure activities.  Long Term Goal 2: Patient will increase left shoulder AROM to WNL for increased abilty to complete personal hygiene. Long Term Goal 3: Patient will increase left shoulder strength to  5/5 for increased ability to lift pots and pans when cooking.  Long Term Goal 4: Patient will decrease fascial restrictions to minimal in her left shoulder region for greater mobility required to pull  up pants.  Long Term Goal 5: Patient will decrease pain in her left shoulder region to 1/10 when completing desired daily activities.   Problem List Patient Active Problem List   Diagnosis Date Noted  . Hip pain 09/08/2012  . Occupational therapy encounter 08/30/2012  . Muscle weakness (generalized) 06/26/2012  . Rotator cuff syndrome of left shoulder 06/21/2012  . Pain in joint, shoulder region 06/16/2012  . Bronchitis, acute 04/28/2012  . Skin rash 02/24/2012  . Anal warts 02/04/2012  . Peripheral edema 02/04/2012  . HSV (herpes simplex virus) infection 12/09/2011  . Dyspnea 11/15/2011  . Breast cancer 10/11/2011  . Essential hypertension, benign 10/11/2011  . DM (diabetes mellitus) 10/11/2011  . OA (osteoarthritis) 10/11/2011  . Morbidly obese 10/11/2011  . Asthma 10/11/2011  . Hyperlipidemia 10/11/2011  . Chronic fatigue 10/11/2011  . OSA (obstructive sleep apnea) 10/11/2011    End of Session Activity Tolerance: Patient tolerated treatment well General Behavior During Therapy: [facility] for tasks assessed/performed Cognition: WFL for tasks performed  Shirlean Mylar, OTR/L  10/19/2012, 3:24 PM

## 2012-10-23 ENCOUNTER — Ambulatory Visit (HOSPITAL_COMMUNITY)
Admission: RE | Admit: 2012-10-23 | Discharge: 2012-10-23 | Disposition: A | Discharge status: Home / Self Care | Payer: Medicare Other | Source: Ambulatory Visit | Attending: Orthopedic Surgery | Admitting: Orthopedic Surgery

## 2012-10-23 DIAGNOSIS — IMO0001 Reserved for inherently not codable concepts without codable children: Secondary | ICD-10-CM

## 2012-10-23 NOTE — Progress Notes (Signed)
Occupational Therapy Treatment Patient Details  Name: Julie Clay MRN: [id number] Date of Birth: 1940/07/20  Today's Date: 10/23/2012 Time: 4098-1191 OT Time Calculation (min): 41 min Manual Therapy 4782-9562 24' Therapeutic Exercises 1308-6578 17' Visit#: 15 of 24  Re-eval: 10/30/12    Authorization: Medicare  Authorization Time Period: before 19th visit  Authorization Visit#: 15 of 19  Subjective S:  I over did it this weekend.  I made 2 necklaces.  Limitations: Protocol (see scanned for full details)  through 09/06/12, PROM flexion and abduction no limit, ER performed at 0 -80 abdcution.  09/06/12 AAROM-AROM as tolerated. Pain Assessment Currently in Pain?: Yes Pain Score: 1  Pain Location: Shoulder Pain Orientation: Left Pain Type: Acute pain  Precautions/Restrictions   AROM and strengthening as tolerated  Exercise/Treatments Supine Protraction: PROM;Both;Strengthening;15 reps Protraction Weight (lbs): 1 Horizontal ABduction: PROM;5 reps;Strengthening;15 reps Horizontal ABduction Weight (lbs): 1 External Rotation: PROM;5 reps;Strengthening;15 reps External Rotation Weight (lbs): 1 Internal Rotation: PROM;5 reps;Strengthening;15 reps Flexion: PROM;5 reps;Strengthening;15 reps Shoulder Flexion Weight (lbs): 1 ABduction: PROM;5 reps;Strengthening;15 reps Shoulder ABduction Weight (lbs): 1 Other Supine Exercises: serratus anterior punch x 10 with 1#  Seated Protraction: Strengthening;Weights;10 reps Protraction Weight (lbs): 1 Horizontal ABduction: Strengthening;Weights;10 reps Horizontal ABduction Weight (lbs): 1 External Rotation: Strengthening;Weights;10 reps External Rotation Weight (lbs): 1 Internal Rotation: Strengthening;Weights;10 reps Internal Rotation Weight (lbs): 1 Flexion: Strengthening;Weights;10 reps Flexion Weight (lbs): 1 Abduction: Strengthening;Weights;10 reps ABduction Limitations: limited to 90 of abducted  Standing External Rotation:  Theraband;15 reps Theraband Level (Shoulder External Rotation): Level 3 (Green) Internal Rotation: Theraband;15 reps Theraband Level (Shoulder Internal Rotation): Level 3 (Green) Flexion: Theraband;15 reps Theraband Level (Shoulder Flexion): Level 3 (Green) Extension: Theraband;15 reps Theraband Level (Shoulder Extension): Level 3 (Green) ROM / Strengthening / Isometric Strengthening UBE (Upper Arm Bike): omit per patient request Wall Wash: 2' "W" Arms: 10 X X to V Arms: completed with left arm only (D2 extension to D2 flexion) Proximal Shoulder Strengthening, Supine: 10X each with 1# resistance without resting.  Proximal Shoulder Strengthening, Seated: 10 X each without resting between exercises        Manual Therapy Manual Therapy: Myofascial release Myofascial Release: Myofascial release and manual stretching to left upper arm, scapular area, lateral upper arm and upper trap to decrease pain and fascial restrictions and increase pain free mobility in left shoulder  Occupational Therapy Assessment and Plan OT Assessment and Plan Clinical Impression Statement: A:  Patient continues to have difficulty with abduction past 90 with strengthening.   OT Plan: P:  Resume upper extremity bike exercise, add overhead lace.    Goals Short Term Goals Time to Complete Short Term Goals: 6 weeks Short Term Goal 1: Patient will be educated on a HEP. Short Term Goal 2: Patient will increase left shoulder PROM bto WFL for for greater ease with tucking her shirts in. Short Term Goal 3: Patient will increase her left shoulder strength to 3+/5 for increased ability to lift pots and pans when cooking.  Short Term Goal 4: Patient will decrease fascial restrictions from moderate-max to moderate for greater mobility needed for personal hygiene completion. Short Term Goal 5: Patient will decrease pain to 3/10 in her left shoulder for increased abilty to sleep at night.  Long Term Goals Time to Complete  Long Term Goals: 12 weeks Long Term Goal 1: Patient will return to prior level of independence with all functional and desired leisure activities.  Long Term Goal 1 Progress: Progressing toward goal Long Term Goal 2: Patient will  increase left shoulder AROM to WNL for increased abilty to complete personal hygiene. Long Term Goal 2 Progress: Progressing toward goal Long Term Goal 3: Patient will increase left shoulder strength to 5/5 for increased ability to lift pots and pans when cooking.  Long Term Goal 3 Progress: Progressing toward goal Long Term Goal 4: Patient will decrease fascial restrictions to minimal in her left shoulder region for greater mobility required to pull up pants.  Long Term Goal 4 Progress: Progressing toward goal Long Term Goal 5: Patient will decrease pain in her left shoulder region to 1/10 when completing desired daily activities.  Long Term Goal 5 Progress: Progressing toward goal  Problem List Patient Active Problem List   Diagnosis Date Noted  . Hip pain 09/08/2012  . Occupational therapy encounter 08/30/2012  . Muscle weakness (generalized) 06/26/2012  . Rotator cuff syndrome of left shoulder 06/21/2012  . Pain in joint, shoulder region 06/16/2012  . Bronchitis, acute 04/28/2012  . Skin rash 02/24/2012  . Anal warts 02/04/2012  . Peripheral edema 02/04/2012  . HSV (herpes simplex virus) infection 12/09/2011  . Dyspnea 11/15/2011  . Breast cancer 10/11/2011  . Essential hypertension, benign 10/11/2011  . DM (diabetes mellitus) 10/11/2011  . OA (osteoarthritis) 10/11/2011  . Morbidly obese 10/11/2011  . Asthma 10/11/2011  . Hyperlipidemia 10/11/2011  . Chronic fatigue 10/11/2011  . OSA (obstructive sleep apnea) 10/11/2011    End of Session Activity Tolerance: Patient tolerated treatment well General Behavior During Therapy: [facility] for tasks assessed/performed Cognition: WFL for tasks performed  GO    Shirlean Mylar, OTR/L  10/23/2012, 1:50  PM

## 2012-10-26 ENCOUNTER — Ambulatory Visit (HOSPITAL_COMMUNITY)
Admission: RE | Admit: 2012-10-26 | Discharge: 2012-10-26 | Disposition: A | Discharge status: Home / Self Care | Payer: Medicare Other | Source: Ambulatory Visit | Attending: Orthopedic Surgery | Admitting: Orthopedic Surgery

## 2012-10-26 DIAGNOSIS — IMO0001 Reserved for inherently not codable concepts without codable children: Secondary | ICD-10-CM

## 2012-10-26 NOTE — Progress Notes (Signed)
Occupational Therapy Treatment Patient Details  Name: Julie Clay MRN: [id number] Date of Birth: May 20, 1940  Today's Date: 10/26/2012 Time: 4098-1191 OT Time Calculation (min): 45 min MFR 4782-9562 11' Therex 1308-6578 34'  Visit#: 16 of 24  Re-eval: 10/30/12    Authorization: Medicare  Authorization Time Period: before 19th visit  Authorization Visit#: 16 of 19  Subjective Symptoms/Limitations Symptoms: S: My shoulder is a little sore. It feels like a muscle sore from working it so it's not bad.  Pain Assessment Currently in Pain?: Yes Pain Score: 2  Pain Location: Shoulder Pain Orientation: Left Pain Type: Acute pain  Precautions/Restrictions  Precautions Precautions: None Precaution Comments: Hx of Lt sided breast cancer 1 year ago.   Exercise/Treatments Supine Protraction: PROM;Both;Strengthening;15 reps Protraction Weight (lbs): 1 Horizontal ABduction: PROM;5 reps;Strengthening;12 reps Horizontal ABduction Weight (lbs): 1 External Rotation: PROM;5 reps;Strengthening;15 reps External Rotation Weight (lbs): 1 Internal Rotation: PROM;5 reps;Strengthening;15 reps Internal Rotation Weight (lbs): 1 Flexion: PROM;5 reps;Strengthening;15 reps Shoulder Flexion Weight (lbs): 1 ABduction: PROM;5 reps;Strengthening;15 reps Shoulder ABduction Weight (lbs): 1 Other Supine Exercises: serratus anterior punch x 15 with 1#  Seated Protraction: Strengthening;Weights;10 reps Protraction Weight (lbs): 1 Horizontal ABduction: Strengthening;Weights;10 reps Horizontal ABduction Weight (lbs): 1 External Rotation: Strengthening;Weights;10 reps External Rotation Weight (lbs): 1 Internal Rotation: Strengthening;Weights;10 reps Internal Rotation Weight (lbs): 1 Flexion: Strengthening;Weights;10 reps Flexion Weight (lbs): 1 Abduction: Strengthening;Weights;10 reps ABduction Weight (lbs): 1 ABduction Limitations: limited to 90 of abducted  ROM / Strengthening / Isometric  Strengthening UBE (Upper Arm Bike): 1.5 3' forward 3' reverse Over Head Lace: 1' standing Proximal Shoulder Strengthening, Supine: 15X each with 1# resistance without resting.  Proximal Shoulder Strengthening, Seated: 10 X each with several rest breaks.       Manual Therapy Manual Therapy: Myofascial release Myofascial Release: Myofascial release and manual stretching to left upper arm, scapular area, lateral upper arm and upper trap to decrease pain and fascial restrictions and increase pain free mobility in left shoulder  Occupational Therapy Assessment and Plan OT Assessment and Plan Clinical Impression Statement: A: Added overhead lacing. Patient tolerated well.  OT Plan: P: Cont. to work on increasing AROM to WNL while performing strengthening exercises supine and seated.    Goals Short Term Goals Time to Complete Short Term Goals: 6 weeks Short Term Goal 1: Patient will be educated on a HEP. Short Term Goal 2: Patient will increase left shoulder PROM bto WFL for for greater ease with tucking her shirts in. Short Term Goal 3: Patient will increase her left shoulder strength to 3+/5 for increased ability to lift pots and pans when cooking.  Short Term Goal 4: Patient will decrease fascial restrictions from moderate-max to moderate for greater mobility needed for personal hygiene completion. Short Term Goal 5: Patient will decrease pain to 3/10 in her left shoulder for increased abilty to sleep at night.  Long Term Goals Time to Complete Long Term Goals: 12 weeks Long Term Goal 1: Patient will return to prior level of independence with all functional and desired leisure activities.  Long Term Goal 2: Patient will increase left shoulder AROM to WNL for increased abilty to complete personal hygiene. Long Term Goal 3: Patient will increase left shoulder strength to 5/5 for increased ability to lift pots and pans when cooking.  Long Term Goal 4: Patient will decrease fascial restrictions  to minimal in her left shoulder region for greater mobility required to pull up pants.  Long Term Goal 5: Patient will decrease pain in her  left shoulder region to 1/10 when completing desired daily activities.   Problem List Patient Active Problem List   Diagnosis Date Noted  . Hip pain 09/08/2012  . Occupational therapy encounter 08/30/2012  . Muscle weakness (generalized) 06/26/2012  . Rotator cuff syndrome of left shoulder 06/21/2012  . Pain in joint, shoulder region 06/16/2012  . Bronchitis, acute 04/28/2012  . Skin rash 02/24/2012  . Anal warts 02/04/2012  . Peripheral edema 02/04/2012  . HSV (herpes simplex virus) infection 12/09/2011  . Dyspnea 11/15/2011  . Breast cancer 10/11/2011  . Essential hypertension, benign 10/11/2011  . DM (diabetes mellitus) 10/11/2011  . OA (osteoarthritis) 10/11/2011  . Morbidly obese 10/11/2011  . Asthma 10/11/2011  . Hyperlipidemia 10/11/2011  . Chronic fatigue 10/11/2011  . OSA (obstructive sleep apnea) 10/11/2011    End of Session Activity Tolerance: Patient tolerated treatment well General Behavior During Therapy: [facility] for tasks assessed/performed Cognition: [facility] for tasks performed   Limmie Patricia, OTR/L,CBIS   10/26/2012, 2:12 PM

## 2012-10-27 ENCOUNTER — Ambulatory Visit (HOSPITAL_COMMUNITY)
Admission: RE | Admit: 2012-10-27 | Discharge: 2012-10-27 | Disposition: A | Discharge status: Home / Self Care | Payer: Medicare Other | Source: Ambulatory Visit | Attending: Family Medicine | Admitting: Family Medicine

## 2012-10-27 DIAGNOSIS — M25519 Pain in unspecified shoulder: Secondary | ICD-10-CM | POA: Diagnosis not present

## 2012-10-27 DIAGNOSIS — IMO0001 Reserved for inherently not codable concepts without codable children: Secondary | ICD-10-CM | POA: Diagnosis not present

## 2012-10-27 DIAGNOSIS — M6281 Muscle weakness (generalized): Secondary | ICD-10-CM | POA: Insufficient documentation

## 2012-10-27 DIAGNOSIS — E119 Type 2 diabetes mellitus without complications: Secondary | ICD-10-CM | POA: Insufficient documentation

## 2012-10-27 DIAGNOSIS — I10 Essential (primary) hypertension: Secondary | ICD-10-CM | POA: Insufficient documentation

## 2012-10-27 NOTE — Evaluation (Signed)
Physical Therapy Evaluation  Patient Details  Name: Julie Clay MRN: [id number] Date of Birth: [birth year]  Today's Date: 10/27/2012 Time: 1305 - 1345  charge: evaluaton              Visit#: 1 of 16  Re-eval: 11/26/12 Assessment Diagnosis: Lymphedema Surgical Date:  (brest 04/2011; shoulder 07/2012) Prior Therapy: for rotator cuff not for lymphedema.  Authorization: medicare    Past Medical History:  Past Medical History  Diagnosis Date  . Hypertension   . Hyperlipidemia   . Diabetes mellitus   . Asthma     spirometry (2011)- mild ventilary defect  . Cancer 2012    Breast  . Arthritis     Osteoarthritis  . Fatty liver 2013    enlarged  . Sliding hiatal hernia   . Ventral hernia, unspecified, without mention of obstruction or gangrene   . Lung nodule seen on imaging study 2013  . HSV (herpes simplex virus) infection    Past Surgical History:  Past Surgical History  Procedure Laterality Date  . Cesarean section    . 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 Breast surgery  jan 2013  . Hand ligament reconstruction    . Cataract extraction, bilateral      on different occassions   . Sholder arthroscopy with open rotator cuff repair Left 07/26/2012    Subjective Symptoms/Limitations Symptoms: Julie Clay states she was diagnoses with breast cancer January 2013.  She had a lumpectomy with one node removed which was negative.  She did not have to go through chemo or radiation treatments.  The Patient has had recent rotrator cuff surgery in April and ever since has noticed a heaviness in her arm as well as significant increased swelling in her upper arm.  She is being seen by occupational therapy for shoulder rehab who suggested that the swelling may be from lymphedema and not the surgery.  She is therefore being referred for a lymphedema.   Pt sx are consistent with Lymphedema and will be seen four times a week for 3-4 weeks to reduce swelling and then be fitted for a  compression garment. Pain Assessment Currently in Pain?: Yes Pain Score: 2  Pain Location: Shoulder Pain Orientation: Left Pain Type: Acute pain   Balance Screening Balance Screen Has the patient fallen in the past 6 months: No  Prior Functioning  Prior Function Vocation: Retired   Sensation/Coordination/Flexibility/Functional Tests Functional Tests Functional Tests: total volume of fluid for R UE is 2758.51; L 3405.16; difference of 646.66 cc      Exercise/Treatments Manual decongestive techinques    Physical Therapy Assessment and Plan PT Assessment and Plan Clinical Impression Statement: Pt sx consistent with lymphedma who will benefit from skilled therapy of  decongestive therapy techniques including manual and bandage wrapping to decrease volume of lymph.   Rehab Potential: Good PT Frequency: Min 4X/week PT Duration: 4 weeks PT Plan: begin decongestive manual techniques as well as compression wrapping to decrease lymph volume.    Goals PT Short Term Goals Time to Complete Short Term Goals: 2 weeks PT Short Term Goal 1: Pt to be I in skin care and care of bandages PT Short Term Goal 2: Pt to be able to verbalize precaution and risk of infection PT Short Term Goal 3: volume reduced by 50% PT Short Term Goal 4: Pt to be able to don bandages or nighttime compression with min assis PT Long Term Goals Time to Complete Long  Term Goals: 4 weeks PT Long Term Goal 1: Pt volume to be decreased by 80% PT Long Term Goal 2: Pt to verbalize understanding of the maintenance phase of treatment Long Term Goal 3: Pt to be knowledgeable on how and where to be fitted for compression garment,  Problem List Patient Active Problem List   Diagnosis Date Noted  . Hip pain 09/08/2012  . Occupational therapy encounter 08/30/2012  . Muscle weakness (generalized) 06/26/2012  . Rotator cuff syndrome of left shoulder 06/21/2012  . Pain in joint, shoulder region 06/16/2012  . Bronchitis,  acute 04/28/2012  . Skin rash 02/24/2012  . Anal warts 02/04/2012  . Peripheral edema 02/04/2012  . HSV (herpes simplex virus) infection 12/09/2011  . Dyspnea 11/15/2011  . Breast cancer 10/11/2011  . Essential hypertension, benign 10/11/2011  . DM (diabetes mellitus) 10/11/2011  . OA (osteoarthritis) 10/11/2011  . Morbidly obese 10/11/2011  . Asthma 10/11/2011  . Hyperlipidemia 10/11/2011  . Chronic fatigue 10/11/2011  . OSA (obstructive sleep apnea) 10/11/2011    PT Plan of Care PT Home Exercise Plan: given  GP Functional Assessment Tool Used: clinical judgement Functional Limitation: Other PT primary Other PT Primary Current Status (R6045): At least 20 percent but less than 40 percent impaired, limited or restricted Other PT Primary Goal Status (W0981): At least 1 percent but less than 20 percent impaired, limited or restricted  RUSSELL,CINDY 10/27/2012, 5:35 PM  Physician Documentation Your signature is required to indicate approval of the treatment plan as stated above.  Please sign and either send electronically or make a copy of this report for your files and return this physician signed original.   Please mark one 1.__approve of plan  2. ___approve of plan with the following conditions.   ______________________________                                                          _____________________ Physician Signature                                                                                                             Date

## 2012-10-30 ENCOUNTER — Ambulatory Visit (HOSPITAL_COMMUNITY)
Admission: RE | Admit: 2012-10-30 | Discharge: 2012-10-30 | Disposition: A | Discharge status: Home / Self Care | Payer: Medicare Other | Source: Ambulatory Visit | Attending: Family Medicine | Admitting: Family Medicine

## 2012-10-30 ENCOUNTER — Ambulatory Visit (HOSPITAL_COMMUNITY)
Admission: RE | Admit: 2012-10-30 | Discharge: 2012-10-30 | Disposition: A | Discharge status: Home / Self Care | Payer: Medicare Other | Source: Ambulatory Visit | Attending: Orthopedic Surgery | Admitting: Orthopedic Surgery

## 2012-10-30 DIAGNOSIS — IMO0001 Reserved for inherently not codable concepts without codable children: Secondary | ICD-10-CM

## 2012-10-30 NOTE — Progress Notes (Signed)
Physical Therapy Treatment Patient Details  Name: Julie Clay MRN: [id number] Date of Birth: [birth year]  Today's Date: 10/30/2012 Time: 2956-2130 PT Time Calculation (min): 43 min  Visit#: 2 of 16  Re-eval: 11/26/12 Authorization: medicare  Authorization Visit#: 2 of 10  Charges:  Manual 40'  Subjective: Symptoms/Limitations Symptoms: Pt reports no significant difference noted after last session; explained may take a few to notice a difference.  Pt currently with Lt shoulder pain, 2/10. Pain Assessment Currently in Pain?: Yes Pain Score: 2  Pain Location: Shoulder Pain Orientation: Left  Objective: Manual Therapy Manual Therapy: Other (comment) Other Manual Therapy: Manual Lymph Drainage for Lt UE to Rt axillary nodes and Lt inguinal nodes  Physical Therapy Assessment and Plan PT Assessment and Plan Clinical Impression Statement: MLD completed for Lt UE swelling.  Pt without difficulties or discomfort.  Pt without a significant amount of swelling, however discussed/educated on garments for Lt UE and bras/tanks for trunk swelling. PT Plan: Continue MLD techniques and bandaging as needed.     Problem List Patient Active Problem List   Diagnosis Date Noted  . Hip pain 09/08/2012  . Occupational therapy encounter 08/30/2012  . Muscle weakness (generalized) 06/26/2012  . Rotator cuff syndrome of left shoulder 06/21/2012  . Pain in joint, shoulder region 06/16/2012  . Bronchitis, acute 04/28/2012  . Skin rash 02/24/2012  . Anal warts 02/04/2012  . Peripheral edema 02/04/2012  . HSV (herpes simplex virus) infection 12/09/2011  . Dyspnea 11/15/2011  . Breast cancer 10/11/2011  . Essential hypertension, benign 10/11/2011  . DM (diabetes mellitus) 10/11/2011  . OA (osteoarthritis) 10/11/2011  . Morbidly obese 10/11/2011  . Asthma 10/11/2011  . Hyperlipidemia 10/11/2011  . Chronic fatigue 10/11/2011  . OSA (obstructive sleep apnea) 10/11/2011     Lurena Nida,  PTA/CLT 10/30/2012, 4:29 PM

## 2012-10-30 NOTE — Evaluation (Signed)
Occupational Therapy Re-Evaluation  Patient Details  Name: Julie Clay MRN: [id number] Date of Birth: [birth year]  Today's Date: 10/30/2012 Time: 8657-8469 OT Time Calculation (min): 41 min MFR 6295-2841 9' Reassess 3244-0102 12' Therex 7253-6644 20'  Visit#: 17 of 24  Re-eval: 11/27/12  Assessment Diagnosis: S/P Left RCR, SAD, DCE  Authorization: Medicare  Authorization Time Period: before 19th visit  Authorization Visit#: 17 of 27   Past Medical History:  Past Medical History  Diagnosis Date  . Hypertension   . Hyperlipidemia   . Diabetes mellitus   . Asthma     spirometry (2011)- mild ventilary defect  . Cancer 2012    Breast  . Arthritis     Osteoarthritis  . Fatty liver 2013    enlarged  . Sliding hiatal hernia   . Ventral hernia, unspecified, without mention of obstruction or gangrene   . Lung nodule seen on imaging study 2013  . HSV (herpes simplex virus) infection    Past Surgical History:  Past Surgical History  Procedure Laterality Date  . Cesarean section    . 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 Breast surgery  jan 2013  . Hand ligament reconstruction    . Cataract extraction, bilateral      on different occassions   . Sholder arthroscopy with open rotator cuff repair Left 07/26/2012    Subjective Symptoms/Limitations Symptoms: S: My shoulder was killing me last night. I had to go sleep on the recliner. Pain Assessment Currently in Pain?: No/denies  Precautions/Restrictions  Precautions Precautions: None Precaution Comments: Hx of Lt sided breast cancer 1 year ago.    Assessment Additional Assessments LUE AROM (degrees) LUE Overall AROM Comments: assessed supine and (seated).  Left Shoulder Flexion:  (145/117 Last progress note: 135/115) Left Shoulder ABduction:  (150/102 Last progress note: 162/95) Left Shoulder Internal Rotation:  (57/56 Last progress note: 55/35) Left Shoulder External Rotation:  (90/90 Last progress note:  same) LUE Strength Left Shoulder Flexion: 3+/5 (last progress note: same) Left Shoulder ABduction: 3+/5 (last progress note: same ) Left Shoulder Internal Rotation: 4/5 (last progress note: 4-/5) Left Shoulder External Rotation: 4/5 (last progress note: 4-/5) Palpation Palpation: min fascial restricitions noted in left trapezius and scapularis region     Exercise/Treatments Seated Protraction: Strengthening;Weights;10 reps Protraction Weight (lbs): 1 Horizontal ABduction: Strengthening;Weights;10 reps Horizontal ABduction Weight (lbs): 1 External Rotation: Strengthening;Weights;10 reps External Rotation Weight (lbs): 1 Internal Rotation: Strengthening;Weights;10 reps Internal Rotation Weight (lbs): 1 Flexion: Strengthening;Weights;10 reps Flexion Weight (lbs): 1 Abduction: Strengthening;Weights;10 reps ABduction Weight (lbs): 1 ABduction Limitations: limited to 90 of abducted     Manual Therapy Manual Therapy: Myofascial release Myofascial Release: Myofascial release and manual stretching to left upper arm, scapular area, lateral upper arm and upper trap to decrease pain and fascial restrictions and increase pain free mobility in left shoulder  Occupational Therapy Assessment and Plan OT Assessment and Plan Clinical Impression Statement: A: See MD note for progress. Patient has met 1/5 long term goals. Patient has shown increased AROM during supine and seated measurements. OT Plan: P: Cont. to work on increasing AROM to WNL while performing strengthening exercises supine and seated.    Goals Short Term Goals Time to Complete Short Term Goals: 6 weeks Short Term Goal 1: Patient will be educated on a HEP. Short Term Goal 2: Patient will increase left shoulder PROM bto WFL for for greater ease with tucking her shirts in. Short Term Goal 3: Patient will increase her  left shoulder strength to 3+/5 for increased ability to lift pots and pans when cooking.  Short Term Goal 4:  Patient will decrease fascial restrictions from moderate-max to moderate for greater mobility needed for personal hygiene completion. Short Term Goal 5: Patient will decrease pain to 3/10 in her left shoulder for increased abilty to sleep at night.  Long Term Goals Time to Complete Long Term Goals: 12 weeks Long Term Goal 1: Patient will return to prior level of independence with all functional and desired leisure activities.  Long Term Goal 1 Progress: Progressing toward goal Long Term Goal 2: Patient will increase left shoulder AROM to WNL for increased abilty to complete personal hygiene. Long Term Goal 2 Progress: Progressing toward goal Long Term Goal 3: Patient will increase left shoulder strength to 5/5 for increased ability to lift pots and pans when cooking.  Long Term Goal 3 Progress: Progressing toward goal Long Term Goal 4: Patient will decrease fascial restrictions to minimal in her left shoulder region for greater mobility required to pull up pants.  Long Term Goal 4 Progress: Met Long Term Goal 5: Patient will decrease pain in her left shoulder region to 1/10 when completing desired daily activities.  Long Term Goal 5 Progress: Progressing toward goal  Problem List Patient Active Problem List   Diagnosis Date Noted  . Hip pain 09/08/2012  . Occupational therapy encounter 08/30/2012  . Muscle weakness (generalized) 06/26/2012  . Rotator cuff syndrome of left shoulder 06/21/2012  . Pain in joint, shoulder region 06/16/2012  . Bronchitis, acute 04/28/2012  . Skin rash 02/24/2012  . Anal warts 02/04/2012  . Peripheral edema 02/04/2012  . HSV (herpes simplex virus) infection 12/09/2011  . Dyspnea 11/15/2011  . Breast cancer 10/11/2011  . Essential hypertension, benign 10/11/2011  . DM (diabetes mellitus) 10/11/2011  . OA (osteoarthritis) 10/11/2011  . Morbidly obese 10/11/2011  . Asthma 10/11/2011  . Hyperlipidemia 10/11/2011  . Chronic fatigue 10/11/2011  . OSA  (obstructive sleep apnea) 10/11/2011    End of Session Activity Tolerance: Patient tolerated treatment well General Behavior During Therapy: [facility] for tasks assessed/performed Cognition: WFL for tasks performed  GO Functional Assessment Tool Used: UEFI 45/80 56.25% independent 43.75% impaired Functional Limitation: Carrying, moving and handling objects Carrying, Moving and Handling Objects Current Status ([id number]): At least 40 percent but less than 60 percent impaired, limited or restricted Carrying, Moving and Handling Objects Goal Status [phone]): At least 1 percent but less than 20 percent impaired, limited or restricted  Limmie Patricia, OTR/L,CBIS   10/30/2012, 4:06 PM  Physician Documentation Your signature is required to indicate approval of the treatment plan as stated above.  Please sign and either send electronically or make a copy of this report for your files and return this physician signed original.  Please mark one 1.__approve of plan  2. ___approve of plan with the following conditions.   ______________________________                                                          _____________________ Physician Signature  Date  

## 2012-10-31 ENCOUNTER — Ambulatory Visit (HOSPITAL_COMMUNITY)
Admission: RE | Admit: 2012-10-31 | Discharge: 2012-10-31 | Disposition: A | Discharge status: Home / Self Care | Payer: Medicare Other | Source: Ambulatory Visit | Attending: Orthopedic Surgery | Admitting: Orthopedic Surgery

## 2012-10-31 NOTE — Progress Notes (Signed)
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  States she can already tell a difference in the volume in her arm. Pain Assessment Currently in Pain?: No/denies   Objective: Manual Therapy Manual Therapy: Other (comment) Other Manual Therapy: Manual Lymph Drainage for Lt UE to Rt axillary nodes and Lt inguinal nodes  Physical Therapy Assessment and Plan PT Assessment and Plan Clinical Impression Statement: spouse and patient educated on lymphedema diagnosis, treatment and maintenance.  Demonstrated MLD with spouse having good understanding.  Lt UE appears to be responding well to treatment.  Order for compression sleeve faxed to MD. PT Plan: Continue MLD techniques and bandaging as needed.     Problem List Patient Active Problem List   Diagnosis Date Noted  . Hip pain 09/08/2012  . Occupational therapy encounter 08/30/2012  . Muscle weakness (generalized) 06/26/2012  . Rotator cuff syndrome of left shoulder 06/21/2012  . Pain in joint, shoulder region 06/16/2012  . Bronchitis, acute 04/28/2012  . Skin rash 02/24/2012  . Anal warts 02/04/2012  . Peripheral edema 02/04/2012  . HSV (herpes simplex virus) infection 12/09/2011  . Dyspnea 11/15/2011  . Breast cancer 10/11/2011  . Essential hypertension, benign 10/11/2011  . DM (diabetes mellitus) 10/11/2011  . OA (osteoarthritis) 10/11/2011  . Morbidly obese 10/11/2011  . Asthma 10/11/2011  . Hyperlipidemia 10/11/2011  . Chronic fatigue 10/11/2011  . OSA (obstructive sleep apnea) 10/11/2011    PT Plan of Care PT Home Exercise Plan: given   Julie Clay,  PTA/CLT 10/31/2012, 5:24 PM

## 2012-11-01 ENCOUNTER — Ambulatory Visit (HOSPITAL_COMMUNITY)
Admission: RE | Admit: 2012-11-01 | Discharge: 2012-11-01 | Disposition: A | Discharge status: Home / Self Care | Payer: Medicare Other | Source: Ambulatory Visit | Attending: Family Medicine | Admitting: Family Medicine

## 2012-11-01 ENCOUNTER — Other Ambulatory Visit: Payer: Self-pay | Admitting: Family Medicine

## 2012-11-01 NOTE — Progress Notes (Signed)
Physical Therapy Treatment Patient Details  Name: Julie Clay MRN: [id number] Date of Birth: [birth year]  Today's Date: 11/01/2012 Time: 1340-1420 PT Time Calculation (min): 40 min  Visit#: 4 of 16  Re-eval: 11/26/12    Authorization: medicare  Authorization Time Period:    Authorization Visit#: 4 of 10   Subjective: Symptoms/Limitations Symptoms: Pt states that she can lift her arm higher now and she feels it is due to the loss of volume in her arm.  Precautions/Restrictions     Exercise/Treatments     Manual Therapy Other Manual Therapy: manual lymph drainage for Lt UE to Rt axillary and Lt inguinal nodes  Physical Therapy Assessment and Plan PT Assessment and Plan Clinical Impression Statement: Pt feels she is noticing benefit of manual drainage.  Pt encouaged by results. Will continue manual drainage until maximum decongestion has been reached then discharge to compression garments. Pt will benefit from skilled therapeutic intervention in order to improve on the following deficits: Decreased activity tolerance;Difficulty walking;Impaired perceived functional ability;Decreased strength;Pain Rehab Potential: Good PT Frequency: Min 4X/week PT Duration: 4 weeks    Goals  progressing  Problem List Patient Active Problem List   Diagnosis Date Noted  . Hip pain 09/08/2012  . Occupational therapy encounter 08/30/2012  . Muscle weakness (generalized) 06/26/2012  . Rotator cuff syndrome of left shoulder 06/21/2012  . Pain in joint, shoulder region 06/16/2012  . Bronchitis, acute 04/28/2012  . Skin rash 02/24/2012  . Anal warts 02/04/2012  . Peripheral edema 02/04/2012  . HSV (herpes simplex virus) infection 12/09/2011  . Dyspnea 11/15/2011  . Breast cancer 10/11/2011  . Essential hypertension, benign 10/11/2011  . DM (diabetes mellitus) 10/11/2011  . OA (osteoarthritis) 10/11/2011  . Morbidly obese 10/11/2011  . Asthma 10/11/2011  . Hyperlipidemia 10/11/2011  .  Chronic fatigue 10/11/2011  . OSA (obstructive sleep apnea) 10/11/2011    PT Plan of Care PT Home Exercise Plan: given  GP    Tarshia Kot,CINDY 11/01/2012, 2:30 PM

## 2012-11-01 NOTE — Telephone Encounter (Signed)
Med refill

## 2012-11-02 ENCOUNTER — Ambulatory Visit (HOSPITAL_COMMUNITY)
Admission: RE | Admit: 2012-11-02 | Discharge: 2012-11-02 | Disposition: A | Discharge status: Home / Self Care | Payer: Medicare Other | Source: Ambulatory Visit | Attending: Orthopedic Surgery | Admitting: Orthopedic Surgery

## 2012-11-02 NOTE — Progress Notes (Signed)
Occupational Therapy Treatment Patient Details  Name: Julie Clay MRN: [id number] Date of Birth: December 09, 1940  Today's Date: 11/02/2012 Time: 4098-1191 OT Time Calculation (min): 40 min MFR 1305-1320 15' Therex 4782-9562 25'  Visit#: 18 of 24  Re-eval: 11/27/12    Authorization: Medicare  Authorization Time Period: before 19th visit  Authorization Visit#: 18 of 27  Subjective Symptoms/Limitations Symptoms: S; My shoulder hasn't been hurting me very much. Just when I move it a certain way.  Pain Assessment Currently in Pain?: Yes Pain Score: 4  Pain Location: Shoulder Pain Orientation: Left Pain Type: Acute pain  Precautions/Restrictions  Precautions Precautions: None  Exercise/Treatments Supine Protraction: PROM;Both;Strengthening;15 reps Protraction Weight (lbs): 1 Horizontal ABduction: PROM;5 reps;Strengthening;12 reps Horizontal ABduction Weight (lbs): 1 External Rotation: PROM;5 reps;Strengthening;15 reps External Rotation Weight (lbs): 1 Internal Rotation: PROM;5 reps;Strengthening;15 reps Internal Rotation Weight (lbs): 1 Flexion: PROM;5 reps;Strengthening;15 reps Shoulder Flexion Weight (lbs): 1 ABduction: PROM;5 reps;Strengthening;15 reps Shoulder ABduction Weight (lbs): 1 Other Supine Exercises: serratus anterior punch x 15 with 1#  Seated Protraction: Strengthening;Weights;12 reps Protraction Weight (lbs): 1 Horizontal ABduction: Strengthening;Weights;12 reps Horizontal ABduction Weight (lbs): 1 External Rotation: Strengthening;Weights;12 reps External Rotation Weight (lbs): 1 Internal Rotation: Strengthening;Weights;12 reps Internal Rotation Weight (lbs): 1 Flexion: Strengthening;Weights;12 reps Flexion Weight (lbs): 1 Abduction: Strengthening;Weights;12 reps ABduction Weight (lbs): 1 ABduction Limitations: limited to 95 of abducted  ROM / Strengthening / Isometric Strengthening "W" Arms: 10 X X to V Arms: 10X Proximal Shoulder Strengthening,  Supine: 15X each with 1# resistance without resting.  Proximal Shoulder Strengthening, Seated: 10 X each with rest breaks after first 2 positions       Manual Therapy Manual Therapy: Myofascial release Myofascial Release: Myofascial release and manual stretching to left upper arm, scapular area, lateral upper arm and upper trap to decrease pain and fascial restrictions and increase pain free mobility in left shoulder  Occupational Therapy Assessment and Plan OT Assessment and Plan Clinical Impression Statement: A: Increased seated reps to 12. Patient tolerated well. Patient showing slight increase in AROM supine and seated this date.Patient believes her lyphedema  treatment are helping.  OT Plan: P: Increase time with overhead lace.   Goals Short Term Goals Time to Complete Short Term Goals: 6 weeks Short Term Goal 1: Patient will be educated on a HEP. Short Term Goal 2: Patient will increase left shoulder PROM bto WFL for for greater ease with tucking her shirts in. Short Term Goal 3: Patient will increase her left shoulder strength to 3+/5 for increased ability to lift pots and pans when cooking.  Short Term Goal 4: Patient will decrease fascial restrictions from moderate-max to moderate for greater mobility needed for personal hygiene completion. Short Term Goal 5: Patient will decrease pain to 3/10 in her left shoulder for increased abilty to sleep at night.  Long Term Goals Time to Complete Long Term Goals: 12 weeks Long Term Goal 1: Patient will return to prior level of independence with all functional and desired leisure activities.  Long Term Goal 1 Progress: Progressing toward goal Long Term Goal 2: Patient will increase left shoulder AROM to WNL for increased abilty to complete personal hygiene. Long Term Goal 2 Progress: Progressing toward goal Long Term Goal 3: Patient will increase left shoulder strength to 5/5 for increased ability to lift pots and pans when cooking.  Long  Term Goal 3 Progress: Progressing toward goal Long Term Goal 4: Patient will decrease fascial restrictions to minimal in her left shoulder region for greater mobility  required to pull up pants.  Long Term Goal 5: Patient will decrease pain in her left shoulder region to 1/10 when completing desired daily activities.  Long Term Goal 5 Progress: Progressing toward goal  Problem List Patient Active Problem List   Diagnosis Date Noted  . Hip pain 09/08/2012  . Occupational therapy encounter 08/30/2012  . Muscle weakness (generalized) 06/26/2012  . Rotator cuff syndrome of left shoulder 06/21/2012  . Pain in joint, shoulder region 06/16/2012  . Bronchitis, acute 04/28/2012  . Skin rash 02/24/2012  . Anal warts 02/04/2012  . Peripheral edema 02/04/2012  . HSV (herpes simplex virus) infection 12/09/2011  . Dyspnea 11/15/2011  . Breast cancer 10/11/2011  . Essential hypertension, benign 10/11/2011  . DM (diabetes mellitus) 10/11/2011  . OA (osteoarthritis) 10/11/2011  . Morbidly obese 10/11/2011  . Asthma 10/11/2011  . Hyperlipidemia 10/11/2011  . Chronic fatigue 10/11/2011  . OSA (obstructive sleep apnea) 10/11/2011    End of Session Activity Tolerance: Patient tolerated treatment well General Behavior During Therapy: [facility] for tasks assessed/performed Cognition: [facility] for tasks performed   Limmie Patricia, OTR/L,CBIS   11/02/2012, 1:45 PM

## 2012-11-02 NOTE — Progress Notes (Signed)
Physical Therapy Treatment Patient Details  Name: JERICHA BRYDEN MRN: [id number] Date of Birth: 04/02/1941  Today's Date: 11/02/2012 Time: 4696-2952 PT Time Calculation (min): 38 min  Visit#: 5 of 16  Re-eval: 11/26/12   Authorization:   medicare Authorization Visit#: 5 of 10   Subjective: Symptoms/Limitations Symptoms: Pt states that she continues to feel better. Pain Assessment Currently in Pain?: Yes Pain Score: 3  Pain Location: Shoulder Pain Orientation: Left Pain Type: Acute pain    Exercise/Treatments     Manual Therapy Other Manual Therapy: Manual lymph drainage for Lt UE to Rt axillary and Lt inguinal nodes.  Physical Therapy Assessment and Plan PT Assessment and Plan Clinical Impression Statement: Pt will be leaving town.  Pt encouraged to complete self decongestive techniques. Pt will benefit from skilled therapeutic intervention in order to improve on the following deficits: Decreased activity tolerance;Difficulty walking;Impaired perceived functional ability;Decreased strength;Pain Rehab Potential: Good PT Frequency: Min 4X/week PT Duration: 4 weeks PT Plan: Re-measure next week to see progress of decongestive techiniques.  May need bandaging if no appreciable difference.     Goals    Problem List Patient Active Problem List   Diagnosis Date Noted  . Hip pain 09/08/2012  . Occupational therapy encounter 08/30/2012  . Muscle weakness (generalized) 06/26/2012  . Rotator cuff syndrome of left shoulder 06/21/2012  . Pain in joint, shoulder region 06/16/2012  . Bronchitis, acute 04/28/2012  . Skin rash 02/24/2012  . Anal warts 02/04/2012  . Peripheral edema 02/04/2012  . HSV (herpes simplex virus) infection 12/09/2011  . Dyspnea 11/15/2011  . Breast cancer 10/11/2011  . Essential hypertension, benign 10/11/2011  . DM (diabetes mellitus) 10/11/2011  . OA (osteoarthritis) 10/11/2011  . Morbidly obese 10/11/2011  . Asthma 10/11/2011  . Hyperlipidemia  10/11/2011  . Chronic fatigue 10/11/2011  . OSA (obstructive sleep apnea) 10/11/2011    General Behavior During Therapy: [facility] for tasks assessed/performed Cognition: [facility] for tasks performed  GP    RUSSELL,CINDY 11/02/2012, 2:56 PM

## 2012-11-06 ENCOUNTER — Ambulatory Visit (HOSPITAL_COMMUNITY)
Admission: RE | Admit: 2012-11-06 | Discharge: 2012-11-06 | Disposition: A | Discharge status: Home / Self Care | Payer: Medicare Other | Source: Ambulatory Visit | Attending: Orthopedic Surgery | Admitting: Orthopedic Surgery

## 2012-11-06 DIAGNOSIS — IMO0001 Reserved for inherently not codable concepts without codable children: Secondary | ICD-10-CM

## 2012-11-06 NOTE — Progress Notes (Signed)
Occupational Therapy Treatment Patient Details  Name: Julie Clay MRN: [id number] Date of Birth: 1940-10-25  Today's Date: 11/06/2012 Time: 4696-2952 OT Time Calculation (min): 41 min MFR 8413-2440 16' Therex 1027-2536 25'  Visit#: 19 of 24  Re-eval: 11/27/12    Authorization: Medicare  Authorization Time Period: before 19th visit  Authorization Visit#: 19 of 27  Subjective Symptoms/Limitations Symptoms: S: My shoulder was aching this morning but it is a little better now.  Pain Assessment Currently in Pain?: Yes Pain Score: 1  Pain Location: Shoulder Pain Orientation: Left Pain Type: Acute pain  Precautions/Restrictions  Precautions Precautions: None  Exercise/Treatments Standing External Rotation: Theraband;15 reps Theraband Level (Shoulder External Rotation): Level 2 (Red) Internal Rotation: Theraband;15 reps Theraband Level (Shoulder Internal Rotation): Level 2 (Red) Flexion: Theraband;15 reps Theraband Level (Shoulder Flexion): Level 2 (Red) Extension: Theraband;15 reps Theraband Level (Shoulder Extension): Level 2 (Red) Row: Theraband;15 reps Theraband Level (Shoulder Row): Level 2 (Red) Retraction: Theraband;15 reps Theraband Level (Shoulder Retraction): Level 2 (Red) ROM / Strengthening / Isometric Strengthening Wall Wash: 1'30" less time completed due to pain. "W" Arms: 10 X X to V Arms: 10X Proximal Shoulder Strengthening, Supine: 15X each with 1# resistance without resting.       Manual Therapy Manual Therapy: Myofascial release Myofascial Release: Myofascial release and manual stretching to left upper arm, scapular area, lateral upper arm and upper trap to decrease pain and fascial restrictions and increase pain free mobility in left shoulder  Occupational Therapy Assessment and Plan OT Assessment and Plan Clinical Impression Statement: A: Increased soreness in left shoulder during strengthening exercises. Downgraded to red theraband due to  pain.  OT Plan: P: Increase time with overhead lace.    Goals Short Term Goals Time to Complete Short Term Goals: 6 weeks Short Term Goal 1: Patient will be educated on a HEP. Short Term Goal 2: Patient will increase left shoulder PROM bto WFL for for greater ease with tucking her shirts in. Short Term Goal 3: Patient will increase her left shoulder strength to 3+/5 for increased ability to lift pots and pans when cooking.  Short Term Goal 4: Patient will decrease fascial restrictions from moderate-max to moderate for greater mobility needed for personal hygiene completion. Short Term Goal 5: Patient will decrease pain to 3/10 in her left shoulder for increased abilty to sleep at night.  Long Term Goals Time to Complete Long Term Goals: 12 weeks Long Term Goal 1: Patient will return to prior level of independence with all functional and desired leisure activities.  Long Term Goal 1 Progress: Progressing toward goal Long Term Goal 2: Patient will increase left shoulder AROM to WNL for increased abilty to complete personal hygiene. Long Term Goal 2 Progress: Progressing toward goal Long Term Goal 3: Patient will increase left shoulder strength to 5/5 for increased ability to lift pots and pans when cooking.  Long Term Goal 3 Progress: Progressing toward goal Long Term Goal 4: Patient will decrease fascial restrictions to minimal in her left shoulder region for greater mobility required to pull up pants.  Long Term Goal 5: Patient will decrease pain in her left shoulder region to 1/10 when completing desired daily activities.  Long Term Goal 5 Progress: Progressing toward goal  Problem List Patient Active Problem List   Diagnosis Date Noted  . Hip pain 09/08/2012  . Occupational therapy encounter 08/30/2012  . Muscle weakness (generalized) 06/26/2012  . Rotator cuff syndrome of left shoulder 06/21/2012  . Pain in joint, shoulder  region 06/16/2012  . Bronchitis, acute 04/28/2012  . Skin  rash 02/24/2012  . Anal warts 02/04/2012  . Peripheral edema 02/04/2012  . HSV (herpes simplex virus) infection 12/09/2011  . Dyspnea 11/15/2011  . Breast cancer 10/11/2011  . Essential hypertension, benign 10/11/2011  . DM (diabetes mellitus) 10/11/2011  . OA (osteoarthritis) 10/11/2011  . Morbidly obese 10/11/2011  . Asthma 10/11/2011  . Hyperlipidemia 10/11/2011  . Chronic fatigue 10/11/2011  . OSA (obstructive sleep apnea) 10/11/2011    End of Session Activity Tolerance: Patient tolerated treatment well General Behavior During Therapy: [facility] for tasks assessed/performed Cognition: [facility] for tasks performed   Limmie Patricia, OTR/L,CBIS   11/06/2012, 1:45 PM

## 2012-11-06 NOTE — Progress Notes (Signed)
Physical Therapy Treatment Patient Details  Name: Julie Clay MRN: [id number] Date of Birth: [birth year]  Today's Date: 11/06/2012 Time: 1350-1430 PT Time Calculation (min): 40 min Charge:  Manual x 38 Visit#: 6 of 16  Re-eval: 11/26/12    Authorization: medicare  Authorization Time Period:    Authorization Visit#: 6 of 10   Subjective: Symptoms/Limitations Symptoms: Pt has no complaints   Exercise/Treatments      Manual Therapy Manual Therapy: Other (comment) Other Manual Therapy: manual lymph drainage for Lt UE with routing to Rt aillary and Lt inguinal nodes.  Physical Therapy Assessment and Plan PT Assessment and Plan Clinical Impression Statement: Pt appears to have decreased lymph; Pt encouraged to continue decongestive techniques at home. PT Frequency: Min 4X/week PT Duration: 4 weeks PT Plan: remeasure next treatment.    Goals  progressing  Problem List Patient Active Problem List   Diagnosis Date Noted  . Hip pain 09/08/2012  . Occupational therapy encounter 08/30/2012  . Muscle weakness (generalized) 06/26/2012  . Rotator cuff syndrome of left shoulder 06/21/2012  . Pain in joint, shoulder region 06/16/2012  . Bronchitis, acute 04/28/2012  . Skin rash 02/24/2012  . Anal warts 02/04/2012  . Peripheral edema 02/04/2012  . HSV (herpes simplex virus) infection 12/09/2011  . Dyspnea 11/15/2011  . Breast cancer 10/11/2011  . Essential hypertension, benign 10/11/2011  . DM (diabetes mellitus) 10/11/2011  . OA (osteoarthritis) 10/11/2011  . Morbidly obese 10/11/2011  . Asthma 10/11/2011  . Hyperlipidemia 10/11/2011  . Chronic fatigue 10/11/2011  . OSA (obstructive sleep apnea) 10/11/2011    PT - End of Session Activity Tolerance: Patient tolerated treatment well;Patient limited by fatigue  GP    Jeannia Tatro,CINDY 11/06/2012, 4:46 PM

## 2012-11-07 ENCOUNTER — Ambulatory Visit (HOSPITAL_COMMUNITY)
Admission: RE | Admit: 2012-11-07 | Discharge: 2012-11-07 | Disposition: A | Discharge status: Home / Self Care | Payer: Medicare Other | Source: Ambulatory Visit | Attending: Orthopedic Surgery | Admitting: Orthopedic Surgery

## 2012-11-07 NOTE — Progress Notes (Signed)
Physical Therapy Treatment Patient Details  Name: Julie Clay MRN: [id number] Date of Birth: 02-25-1941  Today's Date: 11/07/2012 Time: 1345-1426 PT Time Calculation (min): 41 min  Visit#: 7 of 16  Re-eval: 11/26/12 Authorization: medicare  Authorization Visit#: 7 of 10   Subjective: Symptoms/Limitations Symptoms: Pt states she feels her arm is somewhat better, not as tight. Pain Assessment Currently in Pain?: No/denies   Objective: Manual Therapy Manual Therapy: Other (comment) Myofascial Release: Myofascial release and manual stretching to left upper arm, scapular area, lateral upper arm and upper trap to decrease pain and fascial restrictions and increase pain free mobility in left shouldertaken  Other Manual Therapy: manual lymph drainage for Lt UE with routing to Rt axillary and Lt inguinal nodes  Physical Therapy Assessment and Plan PT Assessment: Lt UE remeasured today for progress.  Overall reduction of 3.52 cc since beginning therapy.  Pt has 3 visits remaining.  Plan to remeasure at that point and discuss need of further therapy.  PT Plan: Continue per POC; remeasure X3 more visits for progress     Problem List Patient Active Problem List   Diagnosis Date Noted  . Hip pain 09/08/2012  . Occupational therapy encounter 08/30/2012  . Muscle weakness (generalized) 06/26/2012  . Rotator cuff syndrome of left shoulder 06/21/2012  . Pain in joint, shoulder region 06/16/2012  . Bronchitis, acute 04/28/2012  . Skin rash 02/24/2012  . Anal warts 02/04/2012  . Peripheral edema 02/04/2012  . HSV (herpes simplex virus) infection 12/09/2011  . Dyspnea 11/15/2011  . Breast cancer 10/11/2011  . Essential hypertension, benign 10/11/2011  . DM (diabetes mellitus) 10/11/2011  . OA (osteoarthritis) 10/11/2011  . Morbidly obese 10/11/2011  . Asthma 10/11/2011  . Hyperlipidemia 10/11/2011  . Chronic fatigue 10/11/2011  . OSA (obstructive sleep apnea) 10/11/2011    PT -  End of Session Activity Tolerance: Patient tolerated treatment well;Patient limited by fatigue General Behavior During Therapy: [facility] for tasks assessed/performed Cognition: WFL for tasks performed   Lurena Nida, PTA/CLT 11/07/2012, 4:47 PM

## 2012-11-08 ENCOUNTER — Ambulatory Visit (HOSPITAL_COMMUNITY)
Admission: RE | Admit: 2012-11-08 | Discharge: 2012-11-08 | Disposition: A | Discharge status: Home / Self Care | Payer: Medicare Other | Source: Ambulatory Visit | Attending: Family Medicine | Admitting: Family Medicine

## 2012-11-08 NOTE — Progress Notes (Signed)
Physical Therapy Treatment Patient Details  Name: Julie Clay MRN: [id number] Date of Birth: [birth year]  Today's Date: 11/08/2012 Time: 1300-1335 PT Time Calculation (min): 35 min Visit#: 8 of 16  Re-eval: 11/26/12 Authorization: medicare  Authorization Visit#: 8 of 10  Charges:  Manual 30'  Subjective:  Pt states her Lt shoulder has been sore lately and hurting some.  Pt reports no know reason for increase in discomfort.    Objective: Manual Therapy Manual Therapy: Other (comment) Other Manual Therapy: manual lymph drainage for Lt UE with routing to Rt axillary and Lt inguinal nodes  Physical Therapy Assessment and Plan PT Assessment and Plan Clinical Impression Statement: Pt encouraged to call to schedule appt for compression garment.  Very little adhesions noted today around scar but able to loosen tissue with manual techniques.l PT Frequency: Min 4X/week PT Duration: 4 weeks PT Plan: Continue per POC; remeasure next week for progress     Problem List Patient Active Problem List   Diagnosis Date Noted  . Hip pain 09/08/2012  . Occupational therapy encounter 08/30/2012  . Muscle weakness (generalized) 06/26/2012  . Rotator cuff syndrome of left shoulder 06/21/2012  . Pain in joint, shoulder region 06/16/2012  . Bronchitis, acute 04/28/2012  . Skin rash 02/24/2012  . Anal warts 02/04/2012  . Peripheral edema 02/04/2012  . HSV (herpes simplex virus) infection 12/09/2011  . Dyspnea 11/15/2011  . Breast cancer 10/11/2011  . Essential hypertension, benign 10/11/2011  . DM (diabetes mellitus) 10/11/2011  . OA (osteoarthritis) 10/11/2011  . Morbidly obese 10/11/2011  . Asthma 10/11/2011  . Hyperlipidemia 10/11/2011  . Chronic fatigue 10/11/2011  . OSA (obstructive sleep apnea) 10/11/2011    PT - End of Session Activity Tolerance: Patient tolerated treatment well;Patient limited by fatigue General Behavior During Therapy: [facility] for tasks  assessed/performed Cognition: WFL for tasks performed   Lurena Nida, PTA/CLT 11/08/2012, 1:38 PM

## 2012-11-09 ENCOUNTER — Ambulatory Visit (HOSPITAL_COMMUNITY)
Admission: RE | Admit: 2012-11-09 | Discharge: 2012-11-09 | Disposition: A | Discharge status: Home / Self Care | Payer: Medicare Other | Source: Ambulatory Visit | Attending: Family Medicine | Admitting: Family Medicine

## 2012-11-09 ENCOUNTER — Other Ambulatory Visit: Payer: Self-pay | Admitting: Family Medicine

## 2012-11-09 ENCOUNTER — Ambulatory Visit (HOSPITAL_COMMUNITY)
Admission: RE | Admit: 2012-11-09 | Discharge: 2012-11-09 | Disposition: A | Discharge status: Home / Self Care | Payer: Medicare Other | Source: Ambulatory Visit | Attending: Orthopedic Surgery | Admitting: Orthopedic Surgery

## 2012-11-09 DIAGNOSIS — IMO0001 Reserved for inherently not codable concepts without codable children: Secondary | ICD-10-CM

## 2012-11-09 NOTE — Progress Notes (Signed)
Occupational Therapy Treatment Patient Details  Name: Julie Clay MRN: [id number] Date of Birth: March 27, 1941  Today's Date: 11/09/2012 Time: 4098-1191 OT Time Calculation (min): 46 min MFR 4782-9562 15' Therex 1308-6578 31'  Visit#: 20 of 24  Re-eval: 11/27/12    Authorization: Medicare  Authorization Time Period: before 19th visit  Authorization Visit#: 20 of 27  Subjective Symptoms/Limitations Symptoms: S: My shoulder is a little sore. I think in two weeks I'll be ready to graduate.  Pain Assessment Currently in Pain?: No/denies  Precautions/Restrictions  Precautions Precautions: None Precaution Comments: Hx of Lt sided breast cancer 1 year ago.   Exercise/Treatments Supine Protraction: PROM;Both;Strengthening;15 reps Protraction Weight (lbs): 1 Horizontal ABduction: PROM;5 reps;Strengthening;12 reps Horizontal ABduction Weight (lbs): 1 Horizontal ABduction Limitations: only able to complete 5X AAROM due to pain External Rotation: PROM;5 reps;Strengthening;15 reps External Rotation Weight (lbs): 1 Internal Rotation: PROM;5 reps;Strengthening;15 reps Internal Rotation Weight (lbs): 1 Flexion: PROM;5 reps;Strengthening;15 reps Shoulder Flexion Weight (lbs): 1 ABduction: PROM;5 reps;Strengthening;15 reps Shoulder ABduction Weight (lbs): 1 Other Supine Exercises: serratus anterior punch x 15 with 1#  Seated Protraction: Strengthening;Weights;12 reps Protraction Weight (lbs): 1 Horizontal ABduction: Strengthening;Weights;12 reps Horizontal ABduction Weight (lbs): 1 External Rotation: Strengthening;Weights;12 reps External Rotation Weight (lbs): 1 Internal Rotation: Strengthening;Weights;12 reps Internal Rotation Weight (lbs): 1 Flexion: Strengthening;Weights;12 reps Flexion Weight (lbs): 1 Abduction: Strengthening;Weights;12 reps ABduction Weight (lbs): 1 ROM / Strengthening / Isometric Strengthening "W" Arms: 10 X X to V Arms: 10X Proximal Shoulder  Strengthening, Supine: 15X each with 1# resistance without resting.  Proximal Shoulder Strengthening, Seated: 15 X each with rest breaks after first 2 positions        Manual Therapy Manual Therapy: Myofascial release Myofascial Release: Myofascial release and manual stretching to left upper arm, scapular area, lateral upper arm and upper trap to decrease pain and fascial restrictions and increase pain free mobility in left shouldertaken  Occupational Therapy Assessment and Plan OT Assessment and Plan Clinical Impression Statement: A: Patient with less soreness. Tolerated all exercises without pain.  OT Plan: P: Increase time with overhead lace.    Goals Short Term Goals Time to Complete Short Term Goals: 6 weeks Short Term Goal 1: Patient will be educated on a HEP. Short Term Goal 2: Patient will increase left shoulder PROM bto WFL for for greater ease with tucking her shirts in. Short Term Goal 3: Patient will increase her left shoulder strength to 3+/5 for increased ability to lift pots and pans when cooking.  Short Term Goal 4: Patient will decrease fascial restrictions from moderate-max to moderate for greater mobility needed for personal hygiene completion. Short Term Goal 5: Patient will decrease pain to 3/10 in her left shoulder for increased abilty to sleep at night.  Long Term Goals Time to Complete Long Term Goals: 12 weeks Long Term Goal 1: Patient will return to prior level of independence with all functional and desired leisure activities.  Long Term Goal 1 Progress: Progressing toward goal Long Term Goal 2: Patient will increase left shoulder AROM to WNL for increased abilty to complete personal hygiene. Long Term Goal 2 Progress: Progressing toward goal Long Term Goal 3: Patient will increase left shoulder strength to 5/5 for increased ability to lift pots and pans when cooking.  Long Term Goal 3 Progress: Progressing toward goal Long Term Goal 4: Patient will decrease  fascial restrictions to minimal in her left shoulder region for greater mobility required to pull up pants.  Long Term Goal 5: Patient will decrease  pain in her left shoulder region to 1/10 when completing desired daily activities.  Long Term Goal 5 Progress: Progressing toward goal  Problem List Patient Active Problem List   Diagnosis Date Noted  . Hip pain 09/08/2012  . Occupational therapy encounter 08/30/2012  . Muscle weakness (generalized) 06/26/2012  . Rotator cuff syndrome of left shoulder 06/21/2012  . Pain in joint, shoulder region 06/16/2012  . Bronchitis, acute 04/28/2012  . Skin rash 02/24/2012  . Anal warts 02/04/2012  . Peripheral edema 02/04/2012  . HSV (herpes simplex virus) infection 12/09/2011  . Dyspnea 11/15/2011  . Breast cancer 10/11/2011  . Essential hypertension, benign 10/11/2011  . DM (diabetes mellitus) 10/11/2011  . OA (osteoarthritis) 10/11/2011  . Morbidly obese 10/11/2011  . Asthma 10/11/2011  . Hyperlipidemia 10/11/2011  . Chronic fatigue 10/11/2011  . OSA (obstructive sleep apnea) 10/11/2011    End of Session Activity Tolerance: Patient tolerated treatment well General Behavior During Therapy: [facility] for tasks assessed/performed Cognition: [facility] for tasks performed   Limmie Patricia, OTR/L,CBIS   11/09/2012, 4:40 PM

## 2012-11-09 NOTE — Progress Notes (Signed)
Physical Therapy Treatment Patient Details  Name: Julie Clay MRN: [id number] Date of Birth: 09/27/1940  Today's Date: 11/09/2012 Time: 4098-1191 PT Time Calculation (min): 34 min  Visit#: 9 of 16  Re-eval: 11/26/12 Authorization: medicare  Authorization Visit#: 9 of 10  Charges: manual 30'  Subjective: Symptoms/Limitations Symptoms: Pt states her arm is a really sore after working with OT previously. Pain Assessment Currently in Pain?: No/denies   Objective: Manual Therapy Manual Therapy: Other (comment) Other Manual Therapy: Myofascial release and manual stretching to left upper arm, scapular area, lateral upper arm and upper trap to decrease pain and fascial restrictions and increase pain free mobility in left shoulder  Physical Therapy Assessment and Plan PT Assessment and Plan Clinical Impression Statement: MLD completed without difficulty or c/o discomfort today. PT Frequency: Min 4X/week PT Duration: 4 weeks PT Plan: Continue per POC; remeasure next week for progress     Problem List Patient Active Problem List   Diagnosis Date Noted  . Hip pain 09/08/2012  . Occupational therapy encounter 08/30/2012  . Muscle weakness (generalized) 06/26/2012  . Rotator cuff syndrome of left shoulder 06/21/2012  . Pain in joint, shoulder region 06/16/2012  . Bronchitis, acute 04/28/2012  . Skin rash 02/24/2012  . Anal warts 02/04/2012  . Peripheral edema 02/04/2012  . HSV (herpes simplex virus) infection 12/09/2011  . Dyspnea 11/15/2011  . Breast cancer 10/11/2011  . Essential hypertension, benign 10/11/2011  . DM (diabetes mellitus) 10/11/2011  . OA (osteoarthritis) 10/11/2011  . Morbidly obese 10/11/2011  . Asthma 10/11/2011  . Hyperlipidemia 10/11/2011  . Chronic fatigue 10/11/2011  . OSA (obstructive sleep apnea) 10/11/2011    PT - End of Session Activity Tolerance: Patient tolerated treatment well;Patient limited by fatigue General Behavior During Therapy:  [facility] for tasks assessed/performed Cognition: WFL for tasks performed   Lurena Nida, PTA/CLT 11/09/2012, 2:37 PM

## 2012-11-10 DIAGNOSIS — Z4789 Encounter for other orthopedic aftercare: Secondary | ICD-10-CM | POA: Diagnosis not present

## 2012-11-13 ENCOUNTER — Ambulatory Visit (HOSPITAL_COMMUNITY)
Admission: RE | Admit: 2012-11-13 | Discharge: 2012-11-13 | Disposition: A | Discharge status: Home / Self Care | Payer: Medicare Other | Source: Ambulatory Visit | Attending: Orthopedic Surgery | Admitting: Orthopedic Surgery

## 2012-11-13 ENCOUNTER — Ambulatory Visit (HOSPITAL_COMMUNITY)
Admission: RE | Admit: 2012-11-13 | Discharge: 2012-11-13 | Disposition: A | Discharge status: Home / Self Care | Payer: Medicare Other | Source: Ambulatory Visit | Attending: Family Medicine | Admitting: Family Medicine

## 2012-11-13 DIAGNOSIS — IMO0001 Reserved for inherently not codable concepts without codable children: Secondary | ICD-10-CM

## 2012-11-13 NOTE — Telephone Encounter (Signed)
Med refilled.

## 2012-11-13 NOTE — Progress Notes (Addendum)
Physical Therapy Treatment Patient Details  Name: Julie Clay MRN: [id number] Date of Birth: [birth year]  Today's Date: 11/13/2012 Time: 1108-1140 PT Time Calculation (min): 32 min  Visit#: 10 of 16  Re-eval: 11/26/12 Authorization: medicare  Authorization Visit#: 10 of 20  Charges: manual 30'  Subjective: Symptoms/Limitations Symptoms: Pt reports her arm is not as heavy as it was. Pain Assessment Currently in Pain?: No/denies   Objective: Manual Therapy Manual Therapy: Other (comment) Other Manual Therapy: Manual lymph drainage of Lt UE to Rt axillary and Lt inguinal nodes  Physical Therapy Assessment and Plan PT Assessment and Plan Clinical Impression Statement: Continued MLD with good subjective results for Lt UE.  Will measure end of this week for progress/volume reduction. PT Frequency: Min 4X/week PT Duration: 4 weeks PT Plan: Continue per POC; remeasure X 3 more visits for progress     Problem List Patient Active Problem List   Diagnosis Date Noted  . Hip pain 09/08/2012  . Occupational therapy encounter 08/30/2012  . Muscle weakness (generalized) 06/26/2012  . Rotator cuff syndrome of left shoulder 06/21/2012  . Pain in joint, shoulder region 06/16/2012  . Bronchitis, acute 04/28/2012  . Skin rash 02/24/2012  . Anal warts 02/04/2012  . Peripheral edema 02/04/2012  . HSV (herpes simplex virus) infection 12/09/2011  . Dyspnea 11/15/2011  . Breast cancer 10/11/2011  . Essential hypertension, benign 10/11/2011  . DM (diabetes mellitus) 10/11/2011  . OA (osteoarthritis) 10/11/2011  . Morbidly obese 10/11/2011  . Asthma 10/11/2011  . Hyperlipidemia 10/11/2011  . Chronic fatigue 10/11/2011  . OSA (obstructive sleep apnea) 10/11/2011    PT - End of Session Activity Tolerance: Patient tolerated treatment well;Patient limited by fatigue General Behavior During Therapy: WFL for tasks assessed/performed Cognition: WFL for tasks performed  GP Functional  Assessment Tool Used: Clinical judgement Other PT Primary Current Status: (W0981): At least 20 percent but less than 40 percent impaired, limited or restricted Other PT Primary Goal Status:(G8991): At least 1 percent but less than 20 percent impaired, limited or restricted  Lurena Nida, PTA/CLT 11/13/2012, 11:44 AM

## 2012-11-13 NOTE — Progress Notes (Signed)
Occupational Therapy Treatment Patient Details  Name: Julie Clay MRN: [id number] Date of Birth: 1940-10-30  Today's Date: 11/13/2012 Time: 4098-1191 OT Time Calculation (min): 45 min MFR 4782-9562 20' Therex 1308-6578 25'  Visit#: 21 of 24  Re-eval: 11/27/12    Authorization: Medicare  Authorization Time Period: before 27th visit  Authorization Visit#: 21 of 27  Subjective Symptoms/Limitations Symptoms: S: My shoulder feels good today. It only hurts if I move it in the wrong way.  Pain Assessment Currently in Pain?: No/denies  Precautions/Restrictions  Precautions Precautions: None Precaution Comments: Hx of Lt sided breast cancer 1 year ago.   Exercise/Treatments Supine Protraction: PROM;Both;Strengthening;15 reps Protraction Weight (lbs): 1 Horizontal ABduction: PROM;5 reps;Strengthening;12 reps Horizontal ABduction Weight (lbs): 1 External Rotation: PROM;5 reps;Strengthening;15 reps External Rotation Weight (lbs): 1 Internal Rotation: PROM;5 reps;Strengthening;15 reps Internal Rotation Weight (lbs): 1 Flexion: PROM;5 reps;Strengthening;15 reps Shoulder Flexion Weight (lbs): 1 ABduction: PROM;5 reps;Strengthening;15 reps Shoulder ABduction Weight (lbs): 1 Other Supine Exercises: serratus anterior punch x 15 with 1#  Seated Protraction: Strengthening;Weights;15 reps Protraction Weight (lbs): 1 Horizontal ABduction: Strengthening;Weights;15 reps Horizontal ABduction Weight (lbs): 1 External Rotation: Strengthening;Weights;15 reps External Rotation Weight (lbs): 1 Internal Rotation: Strengthening;Weights;15 reps Internal Rotation Weight (lbs): 1 Flexion: Strengthening;Weights;15 reps Flexion Weight (lbs): 1 Abduction: Strengthening;Weights;15 reps ABduction Weight (lbs): 1 ROM / Strengthening / Isometric Strengthening Wall Wash: 2' Over Head Lace: resume "W" Arms: 10 X X to V Arms: 10X Proximal Shoulder Strengthening, Supine: 15X each with 1# resistance  without resting.  Proximal Shoulder Strengthening, Seated: 15 X each with rest breaks after first 2 positions       Manual Therapy Manual Therapy: Myofascial release Myofascial Release: Myofascial release and manual stretching to left upper arm, scapular area, lateral upper arm and upper trap to decrease pain and fascial restrictions and increase pain free mobility in left shouldertaken  Occupational Therapy Assessment and Plan OT Assessment and Plan Clinical Impression Statement: A: Left shoulder fatigued frequently during seated AROM and wall wash exercises. Patient required rest breaks.  OT Plan: P: Attempt ball on the wall.    Goals Short Term Goals Time to Complete Short Term Goals: 6 weeks Short Term Goal 1: Patient will be educated on a HEP. Short Term Goal 2: Patient will increase left shoulder PROM bto WFL for for greater ease with tucking her shirts in. Short Term Goal 3: Patient will increase her left shoulder strength to 3+/5 for increased ability to lift pots and pans when cooking.  Short Term Goal 4: Patient will decrease fascial restrictions from moderate-max to moderate for greater mobility needed for personal hygiene completion. Short Term Goal 5: Patient will decrease pain to 3/10 in her left shoulder for increased abilty to sleep at night.  Long Term Goals Time to Complete Long Term Goals: 12 weeks Long Term Goal 1: Patient will return to prior level of independence with all functional and desired leisure activities.  Long Term Goal 1 Progress: Progressing toward goal Long Term Goal 2: Patient will increase left shoulder AROM to WNL for increased abilty to complete personal hygiene. Long Term Goal 2 Progress: Progressing toward goal Long Term Goal 3: Patient will increase left shoulder strength to 5/5 for increased ability to lift pots and pans when cooking.  Long Term Goal 3 Progress: Progressing toward goal Long Term Goal 4: Patient will decrease fascial  restrictions to minimal in her left shoulder region for greater mobility required to pull up pants.  Long Term Goal 5: Patient will decrease  pain in her left shoulder region to 1/10 when completing desired daily activities.  Long Term Goal 5 Progress: Progressing toward goal  Problem List Patient Active Problem List   Diagnosis Date Noted  . Hip pain 09/08/2012  . Occupational therapy encounter 08/30/2012  . Muscle weakness (generalized) 06/26/2012  . Rotator cuff syndrome of left shoulder 06/21/2012  . Pain in joint, shoulder region 06/16/2012  . Bronchitis, acute 04/28/2012  . Skin rash 02/24/2012  . Anal warts 02/04/2012  . Peripheral edema 02/04/2012  . HSV (herpes simplex virus) infection 12/09/2011  . Dyspnea 11/15/2011  . Breast cancer 10/11/2011  . Essential hypertension, benign 10/11/2011  . DM (diabetes mellitus) 10/11/2011  . OA (osteoarthritis) 10/11/2011  . Morbidly obese 10/11/2011  . Asthma 10/11/2011  . Hyperlipidemia 10/11/2011  . Chronic fatigue 10/11/2011  . OSA (obstructive sleep apnea) 10/11/2011    End of Session Activity Tolerance: Patient tolerated treatment well General Behavior During Therapy: [facility] for tasks assessed/performed Cognition: [facility] for tasks performed   Limmie Patricia, OTR/L,CBIS   11/13/2012, 1:24 PM

## 2012-11-14 ENCOUNTER — Ambulatory Visit (HOSPITAL_COMMUNITY)
Admission: RE | Admit: 2012-11-14 | Discharge: 2012-11-14 | Disposition: A | Discharge status: Home / Self Care | Payer: Medicare Other | Source: Ambulatory Visit | Attending: Family Medicine | Admitting: Family Medicine

## 2012-11-14 ENCOUNTER — Telehealth (HOSPITAL_COMMUNITY): Payer: Self-pay

## 2012-11-14 NOTE — Progress Notes (Signed)
Physical Therapy Treatment Patient Details  Name: Julie Clay MRN: [id number] Date of Birth: 05-29-1940  Today's Date: 11/14/2012 Time: 7829-5621 PT Time Calculation (min): 40 min  Visit#: 11 of 16  Re-eval: 11/26/12 Authorization: medicare  Authorization Visit#: 11 of 20  Charges:  Manual 38'  Subjective: Symptoms/Limitations Symptoms: Pt states her Lt shoulder has been achey and was heavy feeling this morning. Pain Assessment Currently in Pain?: No/denies  Objective: Manual Therapy Manual Therapy: Other (comment) Other Manual Therapy: Manual lymph drainage of Lt UE to Rt axillary and Lt inguinal nodes  Physical Therapy Assessment and Plan PT Assessment and Plan Clinical Impression Statement: MLD performed for Lt UE routing lymph fluid to Rt axillary and Lt inguinal nodes.  Overall decreased density felt in Lt UE with manual technique.  Less heaviness voiced at end of session.  Pt to get fitted for compression garment Thursday morning. PT Plan: Continue per POC; remeasure X 2 more visits for progress     Problem List Patient Active Problem List   Diagnosis Date Noted  . Hip pain 09/08/2012  . Occupational therapy encounter 08/30/2012  . Muscle weakness (generalized) 06/26/2012  . Rotator cuff syndrome of left shoulder 06/21/2012  . Pain in joint, shoulder region 06/16/2012  . Bronchitis, acute 04/28/2012  . Skin rash 02/24/2012  . Anal warts 02/04/2012  . Peripheral edema 02/04/2012  . HSV (herpes simplex virus) infection 12/09/2011  . Dyspnea 11/15/2011  . Breast cancer 10/11/2011  . Essential hypertension, benign 10/11/2011  . DM (diabetes mellitus) 10/11/2011  . OA (osteoarthritis) 10/11/2011  . Morbidly obese 10/11/2011  . Asthma 10/11/2011  . Hyperlipidemia 10/11/2011  . Chronic fatigue 10/11/2011  . OSA (obstructive sleep apnea) 10/11/2011    PT - End of Session Activity Tolerance: Patient tolerated treatment well;Patient limited by  fatigue General Behavior During Therapy: [facility] for tasks assessed/performed Cognition: WFL for tasks performed   Lurena Nida, PTA/CLT 11/14/2012, 3:03 PM

## 2012-11-15 ENCOUNTER — Ambulatory Visit (HOSPITAL_COMMUNITY)
Admission: RE | Admit: 2012-11-15 | Discharge: 2012-11-15 | Disposition: A | Discharge status: Home / Self Care | Payer: Medicare Other | Source: Ambulatory Visit | Attending: Orthopedic Surgery | Admitting: Orthopedic Surgery

## 2012-11-15 NOTE — Progress Notes (Signed)
Physical Therapy Treatment Patient Details  Name: Julie Clay MRN: [id number] Date of Birth: [birth year]  Today's Date: 11/15/2012 Time: 2725-3664 PT Time Calculation (min): 34 min  Visit#: 12 of 16  Re-eval: 11/26/12 Authorization: medicare  Authorization Visit#: 12 of 20  Charges:  Manual 34'  Subjective: Symptoms/Limitations Symptoms: Pt states her shoulder feels good today with only occasional pain at her surgical site.  No heaviness in Lt UE either. Pain Assessment Currently in Pain?: No/denies   Objective: Manual Therapy Manual Therapy: Other (comment) Other Manual Therapy: Manual lymph drainage of Lt UE to Rt axillary and Lt inguinal nodes  Physical Therapy Assessment and Plan PT Assessment and Plan Clinical Impression Statement: MLD performed for Lt UE routing lymph fluid to Rt axillary and Lt inguinal nodes. Overall decreased density felt in Lt UE with manual technique. Pt encouraged/reminded to perform self massage or have her husband continue on days she's not at therapy.   Pt to get fitted for compression garment tomorrow morning. PT Plan: Continue per POC; remeasure next visit.     Lurena Nida, PTA/CLT 11/15/2012, 4:48 PM

## 2012-11-16 ENCOUNTER — Ambulatory Visit (HOSPITAL_COMMUNITY)
Admission: RE | Admit: 2012-11-16 | Discharge: 2012-11-16 | Disposition: A | Discharge status: Home / Self Care | Payer: Medicare Other | Source: Ambulatory Visit

## 2012-11-16 ENCOUNTER — Ambulatory Visit (HOSPITAL_COMMUNITY)
Admission: RE | Admit: 2012-11-16 | Discharge: 2012-11-16 | Disposition: A | Discharge status: Home / Self Care | Payer: Medicare Other | Source: Ambulatory Visit | Attending: Orthopedic Surgery | Admitting: Orthopedic Surgery

## 2012-11-16 DIAGNOSIS — IMO0001 Reserved for inherently not codable concepts without codable children: Secondary | ICD-10-CM

## 2012-11-16 NOTE — Progress Notes (Signed)
Physical Therapy Treatment/ re-eval Patient Details  Name: Julie Clay MRN: [id number] Date of Birth: Sep 27, 1940  Today's Date: 11/16/2012 Time: 1355-1430 PT Time Calculation (min): 35 min Charge manual 1400-1430 (2); re-measure 1355-1400 [place] Visit#: 13 of 16  Re-eval: 11/26/12    Authorization: medicare   Authorization Visit#: 13 of 16   Subjective: Symptoms/Limitations Symptoms: Pt states she went to get measured for her garment today. Pain Assessment Currently in Pain?: No/denies   Exercise/Treatments     Manual Therapy Manual Therapy: Other (comment) Other Manual Therapy: Manual lymph drainage of LT UE to Rt axillary and Lt inguinal nodes  Physical Therapy Assessment and Plan PT Assessment and Plan Clinical Impression Statement: PT remeasured today.  Fluid reduction by 273.63 CC.  Pt has not attempted self massage encouraged pt to complete self massage as therapist anticipates D/C after sleeve comes in and pt should be comfortable with self massage at this time. PT Plan: D/C as soon as garment is in.    Goals  Progressing  Problem List Patient Active Problem List   Diagnosis Date Noted  . Hip pain 09/08/2012  . Occupational therapy encounter 08/30/2012  . Muscle weakness (generalized) 06/26/2012  . Rotator cuff syndrome of left shoulder 06/21/2012  . Pain in joint, shoulder region 06/16/2012  . Bronchitis, acute 04/28/2012  . Skin rash 02/24/2012  . Anal warts 02/04/2012  . Peripheral edema 02/04/2012  . HSV (herpes simplex virus) infection 12/09/2011  . Dyspnea 11/15/2011  . Breast cancer 10/11/2011  . Essential hypertension, benign 10/11/2011  . DM (diabetes mellitus) 10/11/2011  . OA (osteoarthritis) 10/11/2011  . Morbidly obese 10/11/2011  . Asthma 10/11/2011  . Hyperlipidemia 10/11/2011  . Chronic fatigue 10/11/2011  . OSA (obstructive sleep apnea) 10/11/2011    PT - End of Session Activity Tolerance: Patient tolerated treatment well;Patient  limited by fatigue General Behavior During Therapy: [facility] for tasks assessed/performed Cognition: WFL for tasks performed  GP    Julie Clay,CINDY 11/16/2012, 3:39 PM

## 2012-11-16 NOTE — Progress Notes (Signed)
Occupational Therapy Treatment Patient Details  Name: Julie Clay MRN: [id number] Date of Birth: Sep 21, 1940  Today's Date: 11/16/2012 Time: 8295-6213 OT Time Calculation (min): 41 min MFR 0865-7846 11' Therex 9629-5284 30'  Visit#: 22 of 24  Re-eval: 11/27/12    Authorization: Medicare  Authorization Time Period: before 27th visit  Authorization Visit#: 22 of 27  Subjective Symptoms/Limitations Symptoms: S: My shoulder was really bothering me this morning. I had to get my husband to rub it for me.  Pain Assessment Currently in Pain?: No/denies  Precautions/Restrictions  Precautions Precautions: None  Exercise/Treatments Supine Protraction: PROM;Both;Strengthening;15 reps Protraction Weight (lbs): 1 Horizontal ABduction: PROM;5 reps;Strengthening;12 reps Horizontal ABduction Weight (lbs): 1 External Rotation: PROM;5 reps;Strengthening;15 reps External Rotation Weight (lbs): 1 Internal Rotation: PROM;5 reps;Strengthening;15 reps Internal Rotation Weight (lbs): 1 Flexion: PROM;5 reps;Strengthening;15 reps Shoulder Flexion Weight (lbs): 1 ABduction: PROM;5 reps;Strengthening;15 reps Shoulder ABduction Weight (lbs): 1 Seated Protraction: Strengthening;Weights;15 reps Protraction Weight (lbs): 1 Horizontal ABduction: Strengthening;Weights;15 reps Horizontal ABduction Weight (lbs): 1 External Rotation: Strengthening;Weights;15 reps External Rotation Weight (lbs): 1 Internal Rotation: Strengthening;Weights;15 reps Internal Rotation Weight (lbs): 1 Flexion: Strengthening;Weights;15 reps Flexion Weight (lbs): 1 Abduction: Strengthening;Weights;15 reps ABduction Weight (lbs): 1 ROM / Strengthening / Isometric Strengthening Proximal Shoulder Strengthening, Supine: 15X each with 1# resistance without resting.  Proximal Shoulder Strengthening, Seated: 15 X each with rest breaks after first 2 positions Ball on Wall: 1' flexion 45" abduction green ball            Manual Therapy Manual Therapy: Myofascial release Myofascial Release: Myofascial release and manual stretching to left upper arm, scapular area, lateral upper arm and upper trap to decrease pain and fascial restrictions and increase pain free mobility in left shoulder.  Occupational Therapy Assessment and Plan OT Assessment and Plan Clinical Impression Statement: A: Added ball on the wall. Patient fatigued during abduction and was unable to complete full time cap.  OT Plan: P: Attempt to complete full minute during ball on the wall exercise.   Goals Short Term Goals Time to Complete Short Term Goals: 6 weeks Short Term Goal 1: Patient will be educated on a HEP. Short Term Goal 2: Patient will increase left shoulder PROM bto WFL for for greater ease with tucking her shirts in. Short Term Goal 3: Patient will increase her left shoulder strength to 3+/5 for increased ability to lift pots and pans when cooking.  Short Term Goal 4: Patient will decrease fascial restrictions from moderate-max to moderate for greater mobility needed for personal hygiene completion. Short Term Goal 5: Patient will decrease pain to 3/10 in her left shoulder for increased abilty to sleep at night.  Long Term Goals Time to Complete Long Term Goals: 12 weeks Long Term Goal 1: Patient will return to prior level of independence with all functional and desired leisure activities.  Long Term Goal 2: Patient will increase left shoulder AROM to WNL for increased abilty to complete personal hygiene. Long Term Goal 3: Patient will increase left shoulder strength to 5/5 for increased ability to lift pots and pans when cooking.  Long Term Goal 4: Patient will decrease fascial restrictions to minimal in her left shoulder region for greater mobility required to pull up pants.  Long Term Goal 5: Patient will decrease pain in her left shoulder region to 1/10 when completing desired daily activities.   Problem List Patient  Active Problem List   Diagnosis Date Noted  . Hip pain 09/08/2012  . Occupational therapy encounter 08/30/2012  .  Muscle weakness (generalized) 06/26/2012  . Rotator cuff syndrome of left shoulder 06/21/2012  . Pain in joint, shoulder region 06/16/2012  . Bronchitis, acute 04/28/2012  . Skin rash 02/24/2012  . Anal warts 02/04/2012  . Peripheral edema 02/04/2012  . HSV (herpes simplex virus) infection 12/09/2011  . Dyspnea 11/15/2011  . Breast cancer 10/11/2011  . Essential hypertension, benign 10/11/2011  . DM (diabetes mellitus) 10/11/2011  . OA (osteoarthritis) 10/11/2011  . Morbidly obese 10/11/2011  . Asthma 10/11/2011  . Hyperlipidemia 10/11/2011  . Chronic fatigue 10/11/2011  . OSA (obstructive sleep apnea) 10/11/2011    End of Session Activity Tolerance: Patient tolerated treatment well General Behavior During Therapy: [facility] for tasks assessed/performed Cognition: [facility] for tasks performed   Limmie Patricia, OTR/L,CBIS   11/16/2012, 1:49 PM

## 2012-11-17 DIAGNOSIS — Q828 Other specified congenital malformations of skin: Secondary | ICD-10-CM | POA: Diagnosis not present

## 2012-11-17 DIAGNOSIS — M79609 Pain in unspecified limb: Secondary | ICD-10-CM | POA: Diagnosis not present

## 2012-11-20 ENCOUNTER — Ambulatory Visit (HOSPITAL_COMMUNITY): Payer: Medicare Other | Admitting: Physical Therapy

## 2012-11-21 ENCOUNTER — Ambulatory Visit (HOSPITAL_COMMUNITY)
Admission: RE | Admit: 2012-11-21 | Discharge: 2012-11-21 | Disposition: A | Discharge status: Home / Self Care | Payer: Medicare Other | Source: Ambulatory Visit | Attending: Orthopedic Surgery | Admitting: Orthopedic Surgery

## 2012-11-21 DIAGNOSIS — M6281 Muscle weakness (generalized): Secondary | ICD-10-CM | POA: Insufficient documentation

## 2012-11-21 DIAGNOSIS — I10 Essential (primary) hypertension: Secondary | ICD-10-CM | POA: Diagnosis not present

## 2012-11-21 DIAGNOSIS — E119 Type 2 diabetes mellitus without complications: Secondary | ICD-10-CM | POA: Diagnosis not present

## 2012-11-21 DIAGNOSIS — IMO0001 Reserved for inherently not codable concepts without codable children: Secondary | ICD-10-CM | POA: Insufficient documentation

## 2012-11-21 NOTE — Progress Notes (Signed)
Physical Therapy Treatment Patient Details  Name: Julie Clay MRN: [id number] Date of Birth: Dec 11, 1940  Today's Date: 11/21/2012 Time: 1345-1415 PT Time Calculation (min): 30 min  Visit#: 14 of 16  Re-eval: 11/26/12 Authorization: medicare  Authorization Visit#: 14 of 16  Charges:  Manual 30'  Subjective: Symptoms/Limitations Symptoms: Pt states garments are ordered and will take 5-7 days.  Also got fitted for bra and BLE garments.   Objective: Manual Therapy Manual Therapy: Other (comment) Myofascial Release: Myofascial release and manual stretching to left upper arm, scapular area, lateral upper arm and upper trap to decrease pain and fascial restrictions and increase pain free mobility in left shoulder  Physical Therapy Assessment and Plan PT Assessment and Plan Clinical Impression Statement: Continued progress expressed with MLD techniques for LLE.  Two treatments remaining; has not attempted self massage yet due to busy with aunt/husband being out of town. PT Plan: Anticipate D/C this week.  Remeasure X 2 more visits.     Problem List Patient Active Problem List   Diagnosis Date Noted  . Hip pain 09/08/2012  . Occupational therapy encounter 08/30/2012  . Muscle weakness (generalized) 06/26/2012  . Rotator cuff syndrome of left shoulder 06/21/2012  . Pain in joint, shoulder region 06/16/2012  . Bronchitis, acute 04/28/2012  . Skin rash 02/24/2012  . Anal warts 02/04/2012  . Peripheral edema 02/04/2012  . HSV (herpes simplex virus) infection 12/09/2011  . Dyspnea 11/15/2011  . Breast cancer 10/11/2011  . Essential hypertension, benign 10/11/2011  . DM (diabetes mellitus) 10/11/2011  . OA (osteoarthritis) 10/11/2011  . Morbidly obese 10/11/2011  . Asthma 10/11/2011  . Hyperlipidemia 10/11/2011  . Chronic fatigue 10/11/2011  . OSA (obstructive sleep apnea) 10/11/2011    General Behavior During Therapy: [facility] for tasks assessed/performed Cognition: [facility] for  tasks performed   Lurena Nida, PTA/CLT 11/21/2012, 2:34 PM

## 2012-11-21 NOTE — Progress Notes (Signed)
Occupational Therapy Treatment Patient Details  Name: Julie Clay MRN: [id number] Date of Birth: 05/22/1940  Today's Date: 11/21/2012 Time: 1478-2956 OT Time Calculation (min): 35 min Manual Therapy 2130-8657 13' Therapeutic exercises 8469-6295 22' Visit#: 23 of 24  Re-eval: 11/27/12    Authorization: Medicare  Authorization Time Period: before 27th visit  Authorization Visit#: 23 of 27  Subjective  S:  If I move it a certain way or use it too much, it aches.  Otherwise, its great! Limitations: Protocol (see scanned for full details)  through 09/06/12, PROM flexion and abduction no limit, ER performed at 0 -80 abdcution.  09/06/12 AAROM-AROM as tolerated. Pain Assessment Currently in Pain?: No/denies Pain Score: 0-No pain  Precautions/Restrictions     Protocol (see scanned for full details)  through 09/06/12, PROM flexion and abduction no limit, ER performed at 0 -80 abdcution.  09/06/12 AAROM-AROM as tolerated.  Exercise/Treatments Supine Protraction: PROM;Both;Strengthening;15 reps Protraction Weight (lbs): 1 Horizontal ABduction: PROM;5 reps;Strengthening;12 reps Horizontal ABduction Weight (lbs): 1 External Rotation: PROM;5 reps;Strengthening;15 reps External Rotation Weight (lbs): 1 Internal Rotation: PROM;5 reps;Strengthening;15 reps Internal Rotation Weight (lbs): 1 Flexion: PROM;5 reps;Strengthening;15 reps Shoulder Flexion Weight (lbs): 1 ABduction: PROM;5 reps;Strengthening;15 reps Shoulder ABduction Weight (lbs): 1 Seated Protraction: Strengthening;Weights;15 reps Protraction Weight (lbs): 1 Horizontal ABduction: Strengthening;Weights;15 reps Horizontal ABduction Weight (lbs): 1 External Rotation: Strengthening;Weights;15 reps External Rotation Weight (lbs): 1 Internal Rotation: Strengthening;Weights;15 reps Internal Rotation Weight (lbs): 1 Flexion: Strengthening;Weights;15 reps Flexion Weight (lbs): 1 Abduction: Strengthening;Weights;15 reps ABduction  Weight (lbs): 1 ROM / Strengthening / Isometric Strengthening UBE (Upper Arm Bike): declined Cybex Press: 1 plate;10 reps Cybex Row: 1 plate;10 reps Wall Wash: 2' "W" Arms: 10 X with 1 # X to V Arms: 10 X with 1 # Proximal Shoulder Strengthening, Seated: 10 X each with 1 pound resistance Ball on Wall: 1' flexion and 1' abduction      Manual Therapy Manual Therapy: Myofascial release Myofascial Release: Myofascial release and manual stretching to left upper arm, scapular area, lateral upper arm and upper trap to decrease pain and fascial restrictions and increase pain free mobility in left shoulder.  Occupational Therapy Assessment and Plan OT Assessment and Plan Clinical Impression Statement: A:  Added 1# to x to v and w arms.  Added cybex press and row with 1 plate for scapular stability.  OT Plan: P:  Reassess for possible DC.   Goals Short Term Goals Time to Complete Short Term Goals: 6 weeks Short Term Goal 1: Patient will be educated on a HEP. Short Term Goal 2: Patient will increase left shoulder PROM bto WFL for for greater ease with tucking her shirts in. Short Term Goal 3: Patient will increase her left shoulder strength to 3+/5 for increased ability to lift pots and pans when cooking.  Short Term Goal 4: Patient will decrease fascial restrictions from moderate-max to moderate for greater mobility needed for personal hygiene completion. Short Term Goal 5: Patient will decrease pain to 3/10 in her left shoulder for increased abilty to sleep at night.  Long Term Goals Time to Complete Long Term Goals: 12 weeks Long Term Goal 1: Patient will return to prior level of independence with all functional and desired leisure activities.  Long Term Goal 2: Patient will increase left shoulder AROM to WNL for increased abilty to complete personal hygiene. Long Term Goal 3: Patient will increase left shoulder strength to 5/5 for increased ability to lift pots and pans when cooking.  Long  Term Goal 4:  Patient will decrease fascial restrictions to minimal in her left shoulder region for greater mobility required to pull up pants.  Long Term Goal 5: Patient will decrease pain in her left shoulder region to 1/10 when completing desired daily activities.   Problem List Patient Active Problem List   Diagnosis Date Noted  . Hip pain 09/08/2012  . Occupational therapy encounter 08/30/2012  . Muscle weakness (generalized) 06/26/2012  . Rotator cuff syndrome of left shoulder 06/21/2012  . Pain in joint, shoulder region 06/16/2012  . Bronchitis, acute 04/28/2012  . Skin rash 02/24/2012  . Anal warts 02/04/2012  . Peripheral edema 02/04/2012  . HSV (herpes simplex virus) infection 12/09/2011  . Dyspnea 11/15/2011  . Breast cancer 10/11/2011  . Essential hypertension, benign 10/11/2011  . DM (diabetes mellitus) 10/11/2011  . OA (osteoarthritis) 10/11/2011  . Morbidly obese 10/11/2011  . Asthma 10/11/2011  . Hyperlipidemia 10/11/2011  . Chronic fatigue 10/11/2011  . OSA (obstructive sleep apnea) 10/11/2011    End of Session Activity Tolerance: Patient tolerated treatment well General Behavior During Therapy: [facility] for tasks assessed/performed Cognition: WFL for tasks performed  GO    Shirlean Mylar, OTR/L  11/21/2012, 1:46 PM

## 2012-11-22 ENCOUNTER — Ambulatory Visit (HOSPITAL_COMMUNITY)
Admission: RE | Admit: 2012-11-22 | Discharge: 2012-11-22 | Disposition: A | Discharge status: Home / Self Care | Payer: Medicare Other | Source: Ambulatory Visit | Attending: Orthopedic Surgery | Admitting: Orthopedic Surgery

## 2012-11-22 NOTE — Progress Notes (Signed)
Physical Therapy Treatment Patient Details  Name: Julie Clay MRN: [id number] Date of Birth: 06/30/1940  Today's Date: 11/22/2012 Time: 1112-1142 PT Time Calculation (min): 30 min  Visit#: 15 of 16  Re-eval: 11/26/12 Authorization: medicare  Authorization Visit#: 15 of 16  Charges:  Manual 30'  Subjective: Symptoms/Limitations Symptoms: Pt reports no difficulties or pain.  Objective: Manual Therapy Manual Therapy: Other (comment) Other Manual Therapy: Manual lymph drainage of LT UE to Rt axillary and Lt inguinal nodes  Physical Therapy Assessment and Plan PT Assessment and Plan Clinical Impression Statement: Continued progress expressed with MLD techniques for LLE. One treatment remaining with discharge anticipated; has not attempted self massage yet due to busy with aunt/husband being out of town PT Plan: Anticipate D/C next visit;  Remeasure L UE for volume reduction.     Problem List Patient Active Problem List   Diagnosis Date Noted  . Hip pain 09/08/2012  . Occupational therapy encounter 08/30/2012  . Muscle weakness (generalized) 06/26/2012  . Rotator cuff syndrome of left shoulder 06/21/2012  . Pain in joint, shoulder region 06/16/2012  . Bronchitis, acute 04/28/2012  . Skin rash 02/24/2012  . Anal warts 02/04/2012  . Peripheral edema 02/04/2012  . HSV (herpes simplex virus) infection 12/09/2011  . Dyspnea 11/15/2011  . Breast cancer 10/11/2011  . Essential hypertension, benign 10/11/2011  . DM (diabetes mellitus) 10/11/2011  . OA (osteoarthritis) 10/11/2011  . Morbidly obese 10/11/2011  . Asthma 10/11/2011  . Hyperlipidemia 10/11/2011  . Chronic fatigue 10/11/2011  . OSA (obstructive sleep apnea) 10/11/2011    General Behavior During Therapy: [facility] for tasks assessed/performed Cognition: [facility] for tasks performed   Lurena Nida, PTA/CLT 11/22/2012, 12:01 PM

## 2012-11-23 ENCOUNTER — Ambulatory Visit (HOSPITAL_COMMUNITY)
Admission: RE | Admit: 2012-11-23 | Discharge: 2012-11-23 | Disposition: A | Discharge status: Home / Self Care | Payer: Medicare Other | Source: Ambulatory Visit | Attending: Orthopedic Surgery | Admitting: Orthopedic Surgery

## 2012-11-23 DIAGNOSIS — IMO0001 Reserved for inherently not codable concepts without codable children: Secondary | ICD-10-CM

## 2012-11-23 NOTE — Progress Notes (Signed)
Physical Therapy Treatment Patient Details  Name: Julie Clay MRN: [id number] Date of Birth: 07-Apr-1941  Today's Date: 11/23/2012 Time: 4098-1191 PT Time Calculation (min): 39 min  Visit#: 16 of 16  Re-eval: 11/26/12 Authorization: medicare  Authorization Visit#: 16 of 16  Charges:  Manual 36'  Subjective: Symptoms/Limitations Symptoms: Pt states she has been completing self massage the past cpl days.  Reports her garment should be in tomorrow or first of next week. Pain Assessment Currently in Pain?: No/denies  Objective: Circumferential measurements taken every 4 cm for Lt UE:  Date 11/07/2012 11/16/2012 11/23/2012   Left Left Left  MCP 18.30 18.50 18.5  WRIST 17 17.1 16.50  4cm 20.00 19.70 19.50  8cm 22.40 21.30 21.30  12 cm 25.80 24.80 24.30  16cm 28.30 27.40 27.00  20cm 30.80 30.50 28.50  24cm 36.00 31.00 30.40  28cm 41.00 39.60 37.00  32cm 42.50 42.00 39.00  36cm 44.30 42.10 41.00                                          Sum of squares 10686.56 9838.06 9075.34  Total Volume 3401.639 3131.553 2888.771    Manual Therapy Manual Therapy: Other (comment) Other Manual Therapy: Manual lymph drainage of LT UE to Rt axillary and Lt inguinal nodes; measurement of Lt UE every 4cm  Physical Therapy Assessment and Plan PT Assessment and Plan PT Assessment:  Pt measured today with overall reduction of 512.87 cc in Lt UE since evaluation on 10/27/12.  Difference between RT/LT UE is now 130.26 cc (was 646.66 cc).  Pt awaiting compression garment and is compliant with HEP and self massage.  PT Plan: Discharge to home maintenance.    Goals Home Exercise Program PT Goal: Perform Home Exercise Program - Progress: Met  PT Short Term Goals Time to Complete Short Term Goals: 2 weeks PT Short Term Goal 1: Pt to be I in skin care and care of bandages - Progress: Met PT Short Term Goal 2: Pt to be able to verbalize precaution and risk of infection- Progress: Met PT Short Term  Goal 3: volume reduced by 50% - Progress: Met PT Short Term Goal 4: Pt to be able to don bandages or nighttime compression with min assist- Progress: Discontinued as not applicable   PT Long Term Goals Time to Complete Long Term Goals: 4 weeks PT Long Term Goal 1: Pt volume to be decreased by 80%- Progress: Met PT Long Term Goal 2: Pt to verbalize understanding of the maintenance phase of treatment- Progress: Met Long Term Goal 3: Pt to be knowledgeable on how and where to be fitted for compression garment- Progress: Met  Problem List Patient Active Problem List   Diagnosis Date Noted  . Hip pain 09/08/2012  . Occupational therapy encounter 08/30/2012  . Muscle weakness (generalized) 06/26/2012  . Rotator cuff syndrome of left shoulder 06/21/2012  . Pain in joint, shoulder region 06/16/2012  . Bronchitis, acute 04/28/2012  . Skin rash 02/24/2012  . Anal warts 02/04/2012  . Peripheral edema 02/04/2012  . HSV (herpes simplex virus) infection 12/09/2011  . Dyspnea 11/15/2011  . Breast cancer 10/11/2011  . Essential hypertension, benign 10/11/2011  . DM (diabetes mellitus) 10/11/2011  . OA (osteoarthritis) 10/11/2011  . Morbidly obese 10/11/2011  . Asthma 10/11/2011  . Hyperlipidemia 10/11/2011  . Chronic fatigue 10/11/2011  . OSA (obstructive sleep apnea) 10/11/2011  General Behavior During Therapy: WFL for tasks assessed/performed Cognition: WFL for tasks performed  GP Functional Assessment Tool Used: Clinical judgement Functional Limitation: Other PT primary Other PT Primary Current Status (Z6109): At least 1 percent but less than 20 percent impaired, limited or restricted Other PT Primary Goal Status (U0454): At least 1 percent but less than 20 percent impaired, limited or restricted Other PT Primary Discharge Status [phone]): At least 1 percent but less than 20 percent impaired, limited or restricted  Lurena Nida, PTA/CLT 11/23/2012, 4:53 PM

## 2012-11-23 NOTE — Evaluation (Signed)
Occupational Therapy Discharge Summary  Patient Details  Name: Julie Clay MRN: [id number] Date of Birth: Aug 07, 1940  Today's Date: 11/23/2012 Time: 1520-1550 OT Time Calculation (min): 30 min MFR 1520-1536 16' Reassess 1536-1550 14;  Visit#: 24 of 24  Re-eval: 11/27/12  Assessment Diagnosis: S/P Left RCR, SAD, DCE  Authorization: Medicare  Authorization Time Period: before 27th visit  Authorization Visit#: 24 of 27   Past Medical History:  Past Medical History  Diagnosis Date  . Hypertension   . Hyperlipidemia   . Diabetes mellitus   . Asthma     spirometry (2011)- mild ventilary defect  . Cancer 2012    Breast  . Arthritis     Osteoarthritis  . Fatty liver 2013    enlarged  . Sliding hiatal hernia   . Ventral hernia, unspecified, without mention of obstruction or gangrene   . Lung nodule seen on imaging study 2013  . HSV (herpes simplex virus) infection    Past Surgical History:  Past Surgical History  Procedure Laterality Date  . Cesarean section    . 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 Breast surgery  jan 2013  . Hand ligament reconstruction    . Cataract extraction, bilateral      on different occassions   . Shoulder arthroscopy with open rotator cuff repair Left 07/26/2012    Subjective Symptoms/Limitations Symptoms: S: I know I'm not 100% back to normal but that will come in time.  Pain Assessment Currently in Pain?: No/denies  Precautions/Restrictions  Precautions Precautions: None Precaution Comments: Hx of Lt sided breast cancer 1 year ago.    Assessment Additional Assessments LUE AROM (degrees) LUE Overall AROM Comments: assessed seated. IR/ER adducted Left Shoulder Flexion: 132 Degrees (last progress note: 117) Left Shoulder ABduction: 134 Degrees (last progress note: 102) Left Shoulder Internal Rotation: 90 Degrees (same at last ) Left Shoulder External Rotation: 70 Degrees (last progress note: 50) LUE Strength Left Shoulder  Flexion:  (4+/5) Left Shoulder ABduction:  (4+/5) Left Shoulder Internal Rotation:  (4+/5) Left Shoulder External Rotation:  (4+/5) Palpation Palpation: trace fascial restrictions noted in left shoulder, upper arm, scapular, and trapezius region.     Exercise/Treatments     Manual Therapy Manual Therapy: Myofascial release Myofascial Release: Myofascial release and manual stretching to left upper arm, scapular area, lateral upper arm and upper trap to decrease pain and fascial restrictions and increase pain free mobility in left shoulder  Occupational Therapy Assessment and Plan OT Assessment and Plan Clinical Impression Statement: A: Patient has met maximum potential for rehab and is ready for discharge this date. See MD note for progress.  OT Plan: P: D/C from therapy.   Goals Short Term Goals Time to Complete Short Term Goals: 6 weeks Short Term Goal 1: Patient will be educated on a HEP. Short Term Goal 2: Patient will increase left shoulder PROM bto WFL for for greater ease with tucking her shirts in. Short Term Goal 3: Patient will increase her left shoulder strength to 3+/5 for increased ability to lift pots and pans when cooking.  Short Term Goal 4: Patient will decrease fascial restrictions from moderate-max to moderate for greater mobility needed for personal hygiene completion. Short Term Goal 5: Patient will decrease pain to 3/10 in her left shoulder for increased abilty to sleep at night.  Long Term Goals Time to Complete Long Term Goals: 12 weeks Long Term Goal 1: Patient will return to prior level of independence with all  functional and desired leisure activities.  Long Term Goal 1 Progress: Progressing toward goal Long Term Goal 2: Patient will increase left shoulder AROM to WNL for increased abilty to complete personal hygiene. Long Term Goal 2 Progress: Met Long Term Goal 3: Patient will increase left shoulder strength to 5/5 for increased ability to lift pots and  pans when cooking.  Long Term Goal 3 Progress: Progressing toward goal Long Term Goal 4: Patient will decrease fascial restrictions to minimal in her left shoulder region for greater mobility required to pull up pants.  Long Term Goal 5: Patient will decrease pain in her left shoulder region to 1/10 when completing desired daily activities.  Long Term Goal 5 Progress: Progressing toward goal  Problem List Patient Active Problem List   Diagnosis Date Noted  . Hip pain 09/08/2012  . Occupational therapy encounter 08/30/2012  . Muscle weakness (generalized) 06/26/2012  . Rotator cuff syndrome of left shoulder 06/21/2012  . Pain in joint, shoulder region 06/16/2012  . Bronchitis, acute 04/28/2012  . Skin rash 02/24/2012  . Anal warts 02/04/2012  . Peripheral edema 02/04/2012  . HSV (herpes simplex virus) infection 12/09/2011  . Dyspnea 11/15/2011  . Breast cancer 10/11/2011  . Essential hypertension, benign 10/11/2011  . DM (diabetes mellitus) 10/11/2011  . OA (osteoarthritis) 10/11/2011  . Morbidly obese 10/11/2011  . Asthma 10/11/2011  . Hyperlipidemia 10/11/2011  . Chronic fatigue 10/11/2011  . OSA (obstructive sleep apnea) 10/11/2011    End of Session Activity Tolerance: Patient tolerated treatment well General Behavior During Therapy: WFL for tasks assessed/performed Cognition: WFL for tasks performed  GO Functional Assessment Tool Used: UEFI score: 65/76 = 85.5% independent  14.5% impaired. Functional Limitation: Carrying, moving and handling objects Carrying, Moving and Handling Objects Current Status [phone]): At least 1 percent but less than 20 percent impaired, limited or restricted Carrying, Moving and Handling Objects Goal Status [phone]): At least 1 percent but less than 20 percent impaired, limited or restricted Carrying, Moving and Handling Objects Discharge Status [phone]): At least 1 percent but less than 20 percent impaired, limited or restricted  Limmie Patricia, OTR/L,CBIS   11/23/2012, 4:17 PM  Physician Documentation Your signature is required to indicate approval of the treatment plan as stated above.  Please sign and either send electronically or make a copy of this report for your files and return this physician signed original.  Please mark one 1.__approve of plan  2. ___approve of plan with the following conditions.   ______________________________                                                          _____________________ Physician Signature                                                                                                             Date

## 2012-11-24 ENCOUNTER — Other Ambulatory Visit: Payer: Self-pay | Admitting: Family Medicine

## 2012-11-24 DIAGNOSIS — E119 Type 2 diabetes mellitus without complications: Secondary | ICD-10-CM | POA: Diagnosis not present

## 2012-11-24 DIAGNOSIS — Z0100 Encounter for examination of eyes and vision without abnormal findings: Secondary | ICD-10-CM | POA: Diagnosis not present

## 2012-11-24 DIAGNOSIS — Z961 Presence of intraocular lens: Secondary | ICD-10-CM | POA: Diagnosis not present

## 2012-11-24 DIAGNOSIS — E785 Hyperlipidemia, unspecified: Secondary | ICD-10-CM | POA: Diagnosis not present

## 2012-11-24 DIAGNOSIS — H52229 Regular astigmatism, unspecified eye: Secondary | ICD-10-CM | POA: Diagnosis not present

## 2012-11-24 LAB — HEMOGLOBIN A1C
Hgb A1c MFr Bld: 7.1 % — ABNORMAL HIGH (ref <5.7)
Mean Plasma Glucose: 157 mg/dL — ABNORMAL HIGH (ref <117)

## 2012-11-24 LAB — LIPID PANEL
Cholesterol: 153 mg/dL (ref 0–200)
Triglycerides: 189 mg/dL — ABNORMAL HIGH (ref <150)
VLDL: 38 mg/dL (ref 0–40)

## 2012-11-24 LAB — MICROALBUMIN / CREATININE URINE RATIO
Creatinine, Urine: 90.8 mg/dL
Microalb Creat Ratio: 5.5 mg/g (ref 0.0–30.0)

## 2012-11-24 LAB — BASIC METABOLIC PANEL
BUN: 19 mg/dL (ref 6–23)
Potassium: 3.9 mEq/L (ref 3.5–5.3)

## 2012-11-29 ENCOUNTER — Ambulatory Visit (INDEPENDENT_AMBULATORY_CARE_PROVIDER_SITE_OTHER): Payer: Medicare Other | Admitting: Family Medicine

## 2012-11-29 ENCOUNTER — Encounter: Payer: Self-pay | Admitting: Family Medicine

## 2012-11-29 ENCOUNTER — Telehealth: Payer: Self-pay | Admitting: Family Medicine

## 2012-11-29 VITALS — BP 140/78 | HR 76 | Temp 97.8°F | Resp 16 | Wt 247.0 lb

## 2012-11-29 DIAGNOSIS — I10 Essential (primary) hypertension: Secondary | ICD-10-CM | POA: Diagnosis not present

## 2012-11-29 DIAGNOSIS — E785 Hyperlipidemia, unspecified: Secondary | ICD-10-CM

## 2012-11-29 DIAGNOSIS — E119 Type 2 diabetes mellitus without complications: Secondary | ICD-10-CM

## 2012-11-29 DIAGNOSIS — R609 Edema, unspecified: Secondary | ICD-10-CM

## 2012-11-29 DIAGNOSIS — G4700 Insomnia, unspecified: Secondary | ICD-10-CM

## 2012-11-29 DIAGNOSIS — R600 Localized edema: Secondary | ICD-10-CM

## 2012-11-29 NOTE — Assessment & Plan Note (Signed)
Compression hose to be ordered. She can continue Lasix as needed. Eyes her during her trip to get up and walk at least once an hour for blood circulation.

## 2012-11-29 NOTE — Patient Instructions (Addendum)
I will send a prescription for compression hose Try the melatonin for sleep I will try to find a vitamin for eyes  Continue current medications F/U 4 months

## 2012-11-29 NOTE — Assessment & Plan Note (Signed)
Bp elevated some,based on new recommendations at goal for her age

## 2012-11-29 NOTE — Assessment & Plan Note (Signed)
a1c shows excellent control, since she has had hypoglycemia with januvia at 50mg  will have her watch her sugar intake more closesly

## 2012-11-29 NOTE — Assessment & Plan Note (Signed)
Advised to shorten daytime naps intake early in the days that she can sleep better during the night. She can also try melatonin

## 2012-11-29 NOTE — Telephone Encounter (Signed)
Please let pt know vitamin is called Ocuvite- it is over the counter made by [employer] Also take Oscal a long with it for her bone strength / she can get the generic of OSCAL if needed

## 2012-11-29 NOTE — Assessment & Plan Note (Signed)
TG a little elevated, will have her increase krill oil to 2 tablets daily

## 2012-11-29 NOTE — Progress Notes (Signed)
  Subjective:    Patient ID: Julie Clay, female    DOB: Nov 18, 1940, 72 y.o.   MRN: [id number]  HPI  Pt here to follow chronic medical problems. She is planning to go on her one-month vacation at the end of this month. He is requesting a prescription for compression hose to take with her. She has been discharged from physical therapy secondary to her rotator cuff injury however she now has some residual lymphedema as this is the same side as her breast surgery. She did well with physical therapy for the lymphedema and a sleeve was ordered for her as well. Diabetes mellitus her blood sugars have been running a low high the past week or so 180s 190s. She does admit to eating more snacks and some cake this week. Her A1c returned at 7.1% She was recently seen by the eye doctor and was told she would need a special vitamin but cannot recall the name and asked my recommendation on this  She has had a couple episodes where she would turn her head and with half a mild dizzy sensation but they quickly resolved and has not interfered with her regular routines.  She does note she has been more fatigued than normal and has been taking long naps sometimes a couple hours at a time and often late in the evening after dinner then she is unable to sleep at night and feels fatigued during the day   Review of Systems   GEN- + fatigue, fever, weight loss,weakness, recent illness HEENT- denies eye drainage, change in vision, nasal discharge, CVS- denies chest pain, palpitations RESP- denies SOB, cough, wheeze ABD- denies N/V, change in stools, abd pain GU- denies dysuria, hematuria, dribbling, incontinence MSK- + joint pain, muscle aches, injury Neuro- denies headache, dizziness, syncope, seizure activity      Objective:   Physical Exam GEN- NAD, alert and oriented x3, obese HEENT- PERRL, EOMI, non injected sclera, pink conjunctiva, MMM, oropharynx clear, TM clear bilat, no effusion, nares clear  Neck-  Supple,  CVS- RRR, no murmur RESP-CTAB ABD-NABS, soft,NT,ND, Large pannus MSK- Back Spine NT, neg SLR, HIP- fair IR/ER bilat, TTP palpation below greater trochanter region,difficult due to habitus, Left arm in sling Pulse- Radial 2+  Ext- trace edema pedal         Assessment & Plan:

## 2012-11-30 ENCOUNTER — Other Ambulatory Visit: Payer: Self-pay | Admitting: Family Medicine

## 2012-11-30 NOTE — Telephone Encounter (Signed)
Medication refilled per protocol. 

## 2012-11-30 NOTE — Telephone Encounter (Signed)
Pt husband Greer Pickerel is aware of message and will let pt know.

## 2012-12-12 NOTE — Addendum Note (Signed)
Encounter addended by: Lurena Nida, PTA on: 12/12/2012  8:24 AM<BR>     Documentation filed: Flowsheet VN, Clinical Notes

## 2013-01-10 ENCOUNTER — Other Ambulatory Visit: Payer: Self-pay | Admitting: Family Medicine

## 2013-01-17 ENCOUNTER — Encounter: Payer: Self-pay | Admitting: Family Medicine

## 2013-01-17 ENCOUNTER — Other Ambulatory Visit: Payer: Self-pay | Admitting: Family Medicine

## 2013-01-17 ENCOUNTER — Ambulatory Visit (INDEPENDENT_AMBULATORY_CARE_PROVIDER_SITE_OTHER): Payer: Medicare Other | Admitting: Family Medicine

## 2013-01-17 VITALS — BP 130/60 | HR 86 | Temp 97.4°F | Resp 18 | Wt 249.0 lb

## 2013-01-17 DIAGNOSIS — J45909 Unspecified asthma, uncomplicated: Secondary | ICD-10-CM | POA: Diagnosis not present

## 2013-01-17 DIAGNOSIS — J209 Acute bronchitis, unspecified: Secondary | ICD-10-CM

## 2013-01-17 MED ORDER — AZITHROMYCIN 250 MG PO TABS: ORAL_TABLET | ORAL | Status: DC

## 2013-01-17 MED ORDER — ALBUTEROL SULFATE HFA 108 (90 BASE) MCG/ACT IN AERS: 2.0000 | INHALATION_SPRAY | RESPIRATORY_TRACT | Status: DC | PRN

## 2013-01-17 NOTE — Patient Instructions (Addendum)
Start antibiotics  Use albuterol as needed  F/U as previous

## 2013-01-17 NOTE — Assessment & Plan Note (Signed)
She does not do well with prednisone as it causes palpitations and upset stomach therefore we will hold on this unless she worsens. She will continue albuterol every 4-6 hours. Continue her delay her. I will add on azithromycin. She can also use Mucinex or Robitussin She's not in a severe exacerbation at this time

## 2013-01-17 NOTE — Progress Notes (Signed)
  Subjective:    Patient ID: Julie Clay, female    DOB: Feb 12, 1941, 72 y.o.   MRN: [id number]  HPI  Patient here with asthma exacerbation. States that she was in [place] last week which he started coughing with some production. She denies any fever. Denies any severe shortness of breath. Her wheezing is worse at nighttime. She is using her delay her at an albuterol but needs a refill on this. Her sputum is yellow tinged. Her husband is also sick. They did recently travel to [place]   Review of Systems - per above  GEN- denies fatigue, fever, weight loss,weakness, recent illness HEENT- denies eye drainage, change in vision, nasal discharge, CVS- denies chest pain, palpitations RESP- denies SOB, +cough, +wheeze Neuro- denies headache, dizziness, syncope, seizure activity       Objective:   Physical Exam GEN- NAD, alert and oriented x3, obese HEENT- PERRL, EOMI, non injected sclera, pink conjunctiva, MMM, oropharynx clear, TM clear bilat, no effusion, nares clear , maxillary sinus NT Neck- Supple, no LAD CVS- RRR, no murmur RESP- scattered expiratory wheeze,+ mild rhonchi, normal WOB, good air movement Pulse- Radial 2+  Ext- trace edema pedal        Assessment & Plan:

## 2013-01-17 NOTE — Addendum Note (Signed)
Addended by: [name] F on: 01/17/2013 02:40 PM   Modules accepted: Orders

## 2013-02-19 ENCOUNTER — Ambulatory Visit (INDEPENDENT_AMBULATORY_CARE_PROVIDER_SITE_OTHER): Payer: Medicare Other | Admitting: Family Medicine

## 2013-02-19 ENCOUNTER — Encounter: Payer: Self-pay | Admitting: Family Medicine

## 2013-02-19 VITALS — BP 120/70 | HR 78 | Temp 98.5°F | Resp 18 | Ht 63.0 in | Wt 246.0 lb

## 2013-02-19 DIAGNOSIS — Z23 Encounter for immunization: Secondary | ICD-10-CM | POA: Diagnosis not present

## 2013-02-19 DIAGNOSIS — M25559 Pain in unspecified hip: Secondary | ICD-10-CM

## 2013-02-19 DIAGNOSIS — M25552 Pain in left hip: Secondary | ICD-10-CM

## 2013-02-19 MED ORDER — TRAMADOL HCL 50 MG PO TABS: 50.0000 mg | ORAL_TABLET | Freq: Two times a day (BID) | ORAL | Status: DC | PRN

## 2013-02-19 NOTE — Patient Instructions (Addendum)
Go to Orthopedics this afternoon at 4pm for evaluation Try the ultram for pain

## 2013-02-19 NOTE — Addendum Note (Signed)
Addended by: [name] F on: 02/19/2013 02:46 PM   Modules accepted: Level of Service

## 2013-02-19 NOTE — Assessment & Plan Note (Addendum)
Recurrence of left hip pain the working diagnosis is osteoarthritis possible trochanteric bursitis based on exam. She's been followed by orthopedics secondary to other osteoarthritis and rotator cuff tear s/p surgery. I went ahead and scheduled her an appointment with orthopedics today I think she would benefit from steroid injection as well as some x-rays. They can provide all this in the office this afternoon and she is going out of town. Tramadol given

## 2013-02-19 NOTE — Progress Notes (Signed)
  Subjective:    Patient ID: Julie Clay, female    DOB: 1940-12-14, 72 y.o.   MRN: [id number]  HPI  Patient presents with left hip pain. She is history of left hip pain back in may of 2014. No specific injury she's not had any falls. She's been seen by orthopedics secondary to rotator cuff tear which she is status post surgery for it from os arthritis. She tried Naprosyn which did not help she didn't switch to ibuprofen 800 mg has helped ease the pain some however it began to cause upset stomach. She has pain when she walks in severe pain at nighttime when she lays on that side. She is due to leave for a trip this weekend and does not feel comfortable walking due to pain   Review of Systems - per above  GEN- denies fatigue, fever, weight loss,weakness, recent illness MSK- + joint pain, muscle aches, injury        Objective:   Physical Exam   GEN- NAD, alert and oriented x3, antalgic gait  Back -  Mild TTP Lumbar spine, neg SLR  Hip- neg Hip rock, Fair ROM blateral, TTP over left greater trochanter  Knees- Good ROM, no effusion   Ext- trace edema Pulse-  DP- 2+        Assessment & Plan:    Flu shot given

## 2013-03-01 DIAGNOSIS — M545 Low back pain: Secondary | ICD-10-CM | POA: Diagnosis not present

## 2013-03-09 DIAGNOSIS — M47817 Spondylosis without myelopathy or radiculopathy, lumbosacral region: Secondary | ICD-10-CM | POA: Diagnosis not present

## 2013-03-12 ENCOUNTER — Other Ambulatory Visit (HOSPITAL_COMMUNITY): Payer: Self-pay | Admitting: Orthopedic Surgery

## 2013-03-12 ENCOUNTER — Other Ambulatory Visit: Payer: Self-pay | Admitting: Family Medicine

## 2013-03-12 DIAGNOSIS — M25562 Pain in left knee: Secondary | ICD-10-CM

## 2013-03-13 ENCOUNTER — Ambulatory Visit (HOSPITAL_COMMUNITY)
Admission: RE | Admit: 2013-03-13 | Discharge: 2013-03-13 | Disposition: A | Discharge status: Home / Self Care | Payer: Medicare Other | Source: Ambulatory Visit | Attending: Orthopedic Surgery | Admitting: Orthopedic Surgery

## 2013-03-13 DIAGNOSIS — M25562 Pain in left knee: Secondary | ICD-10-CM

## 2013-03-13 DIAGNOSIS — M79609 Pain in unspecified limb: Secondary | ICD-10-CM | POA: Diagnosis not present

## 2013-03-13 NOTE — Telephone Encounter (Signed)
Medication refilled per protocol. 

## 2013-03-21 ENCOUNTER — Other Ambulatory Visit: Payer: Self-pay | Admitting: Family Medicine

## 2013-03-21 ENCOUNTER — Encounter (HOSPITAL_COMMUNITY): Payer: Self-pay | Admitting: Oncology

## 2013-03-21 ENCOUNTER — Encounter (HOSPITAL_COMMUNITY): Payer: Medicare Other | Attending: Oncology | Admitting: Oncology

## 2013-03-21 VITALS — BP 129/75 | HR 78 | Temp 98.3°F | Resp 20 | Wt 252.8 lb

## 2013-03-21 DIAGNOSIS — E785 Hyperlipidemia, unspecified: Secondary | ICD-10-CM

## 2013-03-21 DIAGNOSIS — E119 Type 2 diabetes mellitus without complications: Secondary | ICD-10-CM

## 2013-03-21 DIAGNOSIS — R609 Edema, unspecified: Secondary | ICD-10-CM

## 2013-03-21 DIAGNOSIS — C50919 Malignant neoplasm of unspecified site of unspecified female breast: Secondary | ICD-10-CM

## 2013-03-21 DIAGNOSIS — Z170 Estrogen receptor positive status [ER+]: Secondary | ICD-10-CM

## 2013-03-21 DIAGNOSIS — C50912 Malignant neoplasm of unspecified site of left female breast: Secondary | ICD-10-CM

## 2013-03-21 LAB — CBC WITH DIFFERENTIAL/PLATELET
Basophils Absolute: 0 10*3/uL (ref 0.0–0.1)
Basophils Relative: 0 % (ref 0–1)
Eosinophils Absolute: 0.3 10*3/uL (ref 0.0–0.7)
HCT: 35.1 % — ABNORMAL LOW (ref 36.0–46.0)
Hemoglobin: 11.9 g/dL — ABNORMAL LOW (ref 12.0–15.0)
Lymphs Abs: 1.8 10*3/uL (ref 0.7–4.0)
MCH: 27.3 pg (ref 26.0–34.0)
MCHC: 33.9 g/dL (ref 30.0–36.0)
Monocytes Absolute: 0.9 10*3/uL (ref 0.1–1.0)
Monocytes Relative: 9 % (ref 3–12)
Neutro Abs: 6.5 10*3/uL (ref 1.7–7.7)
Neutrophils Relative %: 69 % (ref 43–77)
Platelets: 260 10*3/uL (ref 150–400)
RBC: 4.36 MIL/uL (ref 3.87–5.11)
RDW: 14.9 % (ref 11.5–15.5)
WBC: 9.4 10*3/uL (ref 4.0–10.5)

## 2013-03-21 LAB — BASIC METABOLIC PANEL
BUN: 25 mg/dL — ABNORMAL HIGH (ref 6–23)
Calcium: 9.4 mg/dL (ref 8.4–10.5)
Chloride: 104 mEq/L (ref 96–112)
Creat: 1.15 mg/dL — ABNORMAL HIGH (ref 0.50–1.10)
Glucose, Bld: 133 mg/dL — ABNORMAL HIGH (ref 70–99)

## 2013-03-21 LAB — HEMOGLOBIN A1C: Mean Plasma Glucose: 160 mg/dL — ABNORMAL HIGH (ref <117)

## 2013-03-21 LAB — LIPID PANEL
Cholesterol: 158 mg/dL (ref 0–200)
Triglycerides: 184 mg/dL — ABNORMAL HIGH (ref <150)

## 2013-03-21 NOTE — Patient Instructions (Signed)
[facility] Discharge Instructions  RECOMMENDATIONS MADE BY THE CONSULTANT AND ANY TEST RESULTS WILL BE SENT TO YOUR REFERRING PHYSICIAN.  EXAM FINDINGS BY THE PHYSICIAN TODAY AND SIGNS OR SYMPTOMS TO REPORT TO CLINIC OR PRIMARY PHYSICIAN: Exam and findings as discussed by Julie Anes, PA-C.  We will stop the Arimidex for 2 weeks.  If your symptoms improve we will know it's the Arimidex causing your discomfort.  Call us and we will switch you to something different.  If symptoms don't resolve then restart the arimidex. Report any new lumps, bone pain, shortness of breath or other symptoms.  MEDICATIONS PRESCRIBED:  Stop Arimidex for 2 weeks.  INSTRUCTIONS/FOLLOW-UP: Return in 6 months for appointment.  Thank you for choosing [name] to provide your oncology and hematology care.  To afford each patient quality time with our providers, please arrive at least 15 minutes before your scheduled appointment time.  With your help, our goal is to use those 15 minutes to complete the necessary work-up to ensure our physicians have the information they need to help with your evaluation and healthcare recommendations.    Effective January 1st, 2014, we ask that you re-schedule your appointment with our physicians should you arrive 10 or more minutes late for your appointment.  We strive to give you quality time with our providers, and arriving late affects you and other patients whose appointments are after yours.    Again, thank you for choosing [facility].  Our hope is that these requests will decrease the amount of time that you wait before being seen by our physicians.       _____________________________________________________________  Should you have questions after your visit to [facility], please contact our office at [phone] between the hours of 8:30 a.m. and 5:00 p.m.  Voicemails left after 4:30 p.m. will not be returned  until the following business day.  For prescription refill requests, have your pharmacy contact our office with your prescription refill request.

## 2013-03-21 NOTE — Progress Notes (Signed)
Julie Antis, MD 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  INTERVAL HISTORY: Julie Clay 72 y.o. 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 She declined radiation therapy at the time of presentation in December 2012.   Labs are out of date and follow later in the note.  I personally reviewed and went over laboratory results with the patient.  I personally reviewed and went over radiographic studies with the patient.  Her last mammogram was a diagnostic mammogram in Feb 2014.  She will be due for her next mammogram in Feb 2015.  She is reminded of this screening exam.   We reviewed the NCCN guidelines of surveillance for her cancer.  NCCN guidelines recommends the following surveillance for invasive breast cancer:  A. History and Physical exam every 4-6 months for 5 years and then every 12 months.  B. Mammography every 12 months  C. Women on Tamoxifen: annual gynecologic assessment every 12 months if uterus is present.  D. Women on aromatase inhibitor or who experience ovarian failure secondary to treatment should have monitoring of bone health with a bone mineral density determination at baseline and periodically thereafter.  E. Assess and encourage adherence to adjuvant endocrine therapy.  F. Evidence suggests that active lifestyle and achieving and maintaining an ideal body weight (20-25 BMI) may lead to optimal breast cancer outcomes.  She reports that she had fasting lab work performed this AM and these orders were placed by her PCP, Dr. Jeanice Lim.  Hooria notes that over the past 2-3  months she has been having exacerbation of pain that migrates.  She reports that her pain began in the left hip.  She received a cortisone injection, but this did not resolve the pain she reports.  A few weeks later, the pain spontaneously resolved and she began to have popliteal pain in the left leg.  She received a cortisone injection in the knee that helped the pain.  In the interim, she developed low back pain.  She has been seen by [facility] who performed an MRI of lumbar spine revealing a small central disc protrusion at L2-3 without stenosis, small left foraminal disc protrusion L3-4 without neural impingement, and facet degenerative at L4-5 and L5-S1 without stenosis.  This was performed on 03/09/2013.  She reports that she has had a number of tests and none of these have been able to explain her discomfort.  Her concern is that it may be from the Arimidex.  I provided the patient with education regarding myalgia and arthralgia pain induced by Arimidex.  It typically occurs within the first 3 months of initiation (she is 18 months out from starting), it is usually diffuse and not unilateral, and it typically does not migrate.  With this information however, I am not inclined to completely exclude this possibility although I have a low suspicion of this.  Therefore, we will hold the medication for 2 weeks.  If her pain is not better, she is to restart.  If her pain is improved, she will call us and we will consider  switching to Tamoxifen therapy.   She is agreeable to this plan.  Oncologically, she otherwise denies any complaints and ROS questioning is negative.    Past Medical History  Diagnosis Date  . Hypertension   . Hyperlipidemia   . Diabetes mellitus   . Asthma     spirometry (2011)- mild ventilary defect  . Cancer 2012    Breast  . Arthritis     Osteoarthritis  . Fatty liver 2013    enlarged  . Sliding hiatal hernia   . Ventral hernia, unspecified, without mention of  obstruction or gangrene   . Lung nodule seen on imaging study 2013  . HSV (herpes simplex virus) infection   . Lymphedema     Left arm  . 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     is allergic to adhesive; lisinopril; and tetracyclines & related.  Ms. Asante had no medications administered during this visit.  Past Surgical History  Procedure Laterality Date  . Cesarean section    . 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 Breast surgery  jan 2013  . Hand ligament reconstruction    . Cataract extraction, bilateral      on different occassions   . Shoulder arthroscopy with open rotator cuff repair Left 07/26/2012    Denies any headaches, dizziness, double vision, fevers, chills, night sweats, nausea, vomiting, diarrhea, constipation, chest pain, heart palpitations, shortness of breath, blood in stool, black tarry stool, urinary pain, urinary burning, urinary frequency, hematuria.   PHYSICAL EXAMINATION  ECOG PERFORMANCE STATUS: 1 - Symptomatic but completely ambulatory  Filed Vitals:   03/21/13 1330  BP: 129/75  Pulse: 78  Temp: 98.3 F (36.8 C)  Resp: 20    GENERAL:alert, no distress, well nourished, well developed, comfortable, cooperative, obese and odd affect/personality SKIN: skin color, texture, turgor are normal, no rashes or significant lesions HEAD: Normocephalic, No masses, lesions, tenderness or abnormalities EYES: normal, PERRLA, EOMI,  Conjunctiva are pink and non-injected EARS: External ears normal OROPHARYNX:mucous membranes are moist  NECK: supple, no adenopathy, thyroid normal size, non-tender, without nodularity, no stridor, non-tender, trachea midline LYMPH:  no palpable lymphadenopathy, no hepatosplenomegaly BREAST:right breast normal without mass, skin or nipple changes or axillary nodes, left breast surgical site well healed with distortion of breast but without mass, skin or nipple changes or axillary nodes LUNGS: clear to auscultation and percussion HEART: regular rate & rhythm, no murmurs, no gallops, S1 normal and S2 normal ABDOMEN:abdomen soft, non-tender, obese, normal bowel sounds, no masses or organomegaly and no hepatosplenomegaly BACK: Back symmetric, no curvature., No CVA tenderness EXTREMITIES:less then 2 second capillary refill, no joint deformities, effusion, or inflammation, no edema, no skin discoloration, no clubbing, no cyanosis.  Left arm lymphedema with compression sleeve in place. NEURO: alert & oriented x 3 with fluent speech, no focal motor/sensory deficits, gait normal    LABORATORY DATA: CBC    Component Value Date/Time   WBC 9.2 06/22/2012 0940   RBC 4.64 06/22/2012 0940   HGB 12.0 06/22/2012 0940   HCT 36.9 06/22/2012 0940  PLT 260 06/22/2012 0940   MCV 79.5 06/22/2012 0940   MCH 25.9* 06/22/2012 0940   MCHC 32.5 06/22/2012 0940   RDW 15.3 06/22/2012 0940      Chemistry      Component Value Date/Time   NA 140 11/24/2012 0915   K 3.9 11/24/2012 0915   CL 103 11/24/2012 0915   CO2 30 11/24/2012 0915   BUN 19 11/24/2012 0915   CREATININE 0.89 11/24/2012 0915      Component Value Date/Time   CALCIUM 9.7 11/24/2012 0915   ALKPHOS 55 06/22/2012 0940   AST 21 06/22/2012 0940   ALT 29 06/22/2012 0940   BILITOT 0.4 06/22/2012 0940         ASSESSMENT:  1. Left-sided stage I cancer the breast infiltrating ductal type ER positive, PR positive, HER-2/neu nonamplified with an Oncotype DX score 0 presently on  Arimidex which she will take for 5 years beginning in Feb or March 2013. She declined radiation therapy at the time of presentation in December 2012.  2. Left full-thickness supraspinatus tear with mild partial-thickness superior infraspinatus tear requiring arthroscopic intervention in April 2014  3. Left UE edema, mild, likely secondary to #2+ lymphedema. 4. Low back pain 5. Small central disc protrusion L2-3 without stenosis 6. Small left foraminal disc protrustion L3-4 without neural impingement 7. Facet degeneration at L4-5 and   Patient Active Problem List   Diagnosis Date Noted  . Acute bronchitis with asthma 01/17/2013  . Insomnia 11/29/2012  . Hip pain 09/08/2012  . Occupational therapy encounter 08/30/2012  . Muscle weakness (generalized) 06/26/2012  . Rotator cuff syndrome of left shoulder 06/21/2012  . Pain in joint, shoulder region 06/16/2012  . Anal warts 02/04/2012  . Peripheral edema 02/04/2012  . HSV (herpes simplex virus) infection 12/09/2011  . Dyspnea 11/15/2011  . 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 Essential hypertension, benign 10/11/2011  . DM (diabetes mellitus) 10/11/2011  . OA (osteoarthritis) 10/11/2011  . Morbidly obese 10/11/2011  . Asthma 10/11/2011  . Hyperlipidemia 10/11/2011  . Chronic fatigue 10/11/2011  . OSA (obstructive sleep apnea) 10/11/2011     PLAN:  1. I personally reviewed and went over laboratory results with the patient. 2. I personally reviewed and went over radiographic studies with the patient. 3. Next screening mammogram is due in Feb 2015. 4. Labs in 6 months: CBC diff, CMET 5. Hold Arimidex x 2 weeks.  If pain better, she will contact the clinic for further instructions.  If pain is not better, she will restart Arimidex. 6. Return in 6 months for follow-up.   THERAPY PLAN:  NCCN guidelines recommends the following surveillance for invasive breast cancer:  A. History and Physical exam every  4-6 months for 5 years and then every 12 months.  B. Mammography every 12 months  C. Women on Tamoxifen: annual gynecologic assessment every 12 months if uterus is present.  D. Women on aromatase inhibitor or who experience ovarian failure secondary to treatment should have monitoring of bone health with a bone mineral density determination at baseline and periodically thereafter.  E. Assess and encourage adherence to adjuvant endocrine therapy.  F. Evidence suggests that active lifestyle and achieving and maintaining an ideal body weight (20-25 BMI) may lead to optimal breast cancer outcomes.   All questions were answered. The patient knows to call the clinic with any problems, questions or concerns. We can certainly see the patient much sooner if necessary.  Patient and plan discussed with  Dr. Alla German and he is in agreement with the aforementioned.   KEFALAS,THOMAS

## 2013-03-26 ENCOUNTER — Encounter: Payer: Self-pay | Admitting: Family Medicine

## 2013-03-26 ENCOUNTER — Ambulatory Visit (INDEPENDENT_AMBULATORY_CARE_PROVIDER_SITE_OTHER): Payer: Medicare Other | Admitting: Family Medicine

## 2013-03-26 VITALS — BP 120/80 | HR 68 | Temp 98.3°F | Resp 18 | Ht 63.0 in | Wt 250.0 lb

## 2013-03-26 DIAGNOSIS — E119 Type 2 diabetes mellitus without complications: Secondary | ICD-10-CM

## 2013-03-26 DIAGNOSIS — N182 Chronic kidney disease, stage 2 (mild): Secondary | ICD-10-CM

## 2013-03-26 DIAGNOSIS — M2550 Pain in unspecified joint: Secondary | ICD-10-CM

## 2013-03-26 DIAGNOSIS — I10 Essential (primary) hypertension: Secondary | ICD-10-CM | POA: Diagnosis not present

## 2013-03-26 DIAGNOSIS — E785 Hyperlipidemia, unspecified: Secondary | ICD-10-CM | POA: Diagnosis not present

## 2013-03-26 NOTE — Assessment & Plan Note (Signed)
She has some early renal insufficiency. I wonder what the amount of anti-inflammatories that she is taking. We will make it a short-term until she gets back to her orthopedic appointment

## 2013-03-26 NOTE — Progress Notes (Signed)
   Subjective:    Patient ID: Julie Clay, female    DOB: 03/11/1941, 72 y.o.   MRN: [id number]  HPI Patient here to follow chronic medical problems. Diabetes mellitus-A1c was also at 7.2%. She still taking metformin glipizide and Januvia 25 mg. She does not enough she does get hypoglycemia down to 50. She denies any polyuria polydipsia. Her blood sugars are typically around 150 or less  Back pain hip pain-she was sent to orthopedics at her last visit secondary to worsening hip pain. She did receive a cortisone injection which resolved her hip pain. She did subsequently had left knee pain she had a cortisone injection in the knee which helped. A couple weeks later she began having lower back pain with radiation she did have an MRI in which oncology have the results in their note I reviewed. There was no stenosis or impingement.  This week she began to have some pain in her left shoulder which she had repaired for rotator cuff injury. She's currently holding her Arimadex to see if this is contributing to her migrating joint pains. She is on Naprosyn as well for inflammation. She did try the tramadol but this did not help. She has hydrocodone at home.  Fasting labs reviewed Review of Systems  GEN- denies fatigue, fever, weight loss,weakness, recent illness HEENT- denies eye drainage, change in vision, nasal discharge, CVS- denies chest pain, palpitations RESP- denies SOB, cough, wheeze ABD- denies N/V, change in stools, abd pain GU- denies dysuria, hematuria, dribbling, incontinence MSK- +joint pain, muscle aches, injury Neuro- denies headache, dizziness, syncope, seizure activity      Objective:   Physical Exam GEN- NAD, alert and oriented x3, obese HEENT- PERRL, EOMI, non injected sclera, pink conjunctiva, MMM, oropharynx clear, TM clear bilat, no effusion, nares clear  Neck- Supple,  CVS- RRR, no murmur RESP-CTAB Pulse- Radial 2+ DP- 2+ Ext- trace edema pedal         Assessment  & Plan:

## 2013-03-26 NOTE — Assessment & Plan Note (Signed)
Her goal is less than 7.5% based on her age and her hypoglycemia symptoms that occur with little change in her medications. She will continue her current regimen. Advised to eat regular meals with her medications to

## 2013-03-26 NOTE — Assessment & Plan Note (Signed)
Cholesterol is well-controlled LDL at goal Triglycerides are always elevated at 180 due to her diet and habitus

## 2013-03-26 NOTE — Patient Instructions (Signed)
Take 1 hydrocodone at bedtime Take the naprosyn twice a day until the orthopedic appointment Continue all other medications F/U 4 months

## 2013-03-26 NOTE — Assessment & Plan Note (Signed)
Multiple areas of joint pain. Think otherwise contributing to her weight. We'll see at the medication Demadex is contributing. I do not think there is a lot that can be done. She's had injections with some improvement. I'll have her continue the Naprosyn no more than twice a day for the next 4 weeks due to the back. She is followup with orthopedics. I've advised her to take one hydrocodone at bedtime she cannot take during the day due to his somnolence effects and higher doses causes constipation

## 2013-03-26 NOTE — Assessment & Plan Note (Signed)
Multiple areas of joint pain. Think otherwise contributing to her weight. We'll see at the medication Demadex is contributing. I do not think there is a lot that can be done. She's had injections with some improvement. I'll have her continue the Naprosyn no more than twice a day for the next 4 weeks due to the back. She is followup with orthopedics. I've advised her to take one hydrocodone at bedtime she cannot take during the day due to his somnolence effects and higher doses causes constipation  Declines Prevnar 13

## 2013-03-26 NOTE — Assessment & Plan Note (Signed)
Blood pressure well controlled She does not tolerate ACE

## 2013-03-30 ENCOUNTER — Ambulatory Visit: Payer: Medicare Other | Admitting: Family Medicine

## 2013-04-02 ENCOUNTER — Other Ambulatory Visit: Payer: Self-pay | Admitting: Family Medicine

## 2013-04-05 ENCOUNTER — Telehealth: Payer: Self-pay | Admitting: *Deleted

## 2013-04-05 ENCOUNTER — Other Ambulatory Visit: Payer: Self-pay | Admitting: Family Medicine

## 2013-04-05 MED ORDER — FLUOCINONIDE 0.05 % EX OINT: TOPICAL_OINTMENT | Freq: Two times a day (BID) | CUTANEOUS | Status: DC | PRN

## 2013-04-06 NOTE — Telephone Encounter (Signed)
Called pt back and she had stated that her oncologist stopped hormone blocker for 2 weeks she is going out of town for a week and wants to know if it will be ok to go another week without or can she get back on it now?

## 2013-04-06 NOTE — Telephone Encounter (Signed)
She can stay off the medication for another week.

## 2013-04-06 NOTE — Telephone Encounter (Signed)
Pt husband aware of results.

## 2013-04-16 ENCOUNTER — Telehealth: Payer: Self-pay | Admitting: Family Medicine

## 2013-04-16 ENCOUNTER — Other Ambulatory Visit: Payer: Self-pay | Admitting: Family Medicine

## 2013-04-16 NOTE — Telephone Encounter (Signed)
Pt is wanting to speak to you about her cancer medication  Call back number is [phone]

## 2013-04-16 NOTE — Telephone Encounter (Signed)
Medication refilled per protocol. 

## 2013-04-16 NOTE — Telephone Encounter (Signed)
Please call pt and see what she needs, if the muscle pains went away then oncology would have to give her a new medication  If there was no change she can restart it

## 2013-04-17 ENCOUNTER — Other Ambulatory Visit: Payer: Self-pay | Admitting: Family Medicine

## 2013-04-18 ENCOUNTER — Telehealth (HOSPITAL_COMMUNITY): Payer: Self-pay

## 2013-04-18 ENCOUNTER — Other Ambulatory Visit (HOSPITAL_COMMUNITY): Payer: Self-pay | Admitting: Oncology

## 2013-04-18 DIAGNOSIS — C50912 Malignant neoplasm of unspecified site of left female breast: Secondary | ICD-10-CM

## 2013-04-18 MED ORDER — TAMOXIFEN CITRATE 20 MG PO TABS: 20.0000 mg | ORAL_TABLET | Freq: Every day | ORAL | Status: DC

## 2013-04-18 NOTE — Telephone Encounter (Signed)
Message copied by Evelena Leyden on Wed Apr 18, 2013  1:40 PM ------      Message from: Ellouise Newer      Created: Wed Apr 18, 2013  1:31 PM       Let patient know I spoke with Dr. Jeanice Lim.  She reports that the patient's arthralgias and myalgias are better since being off Arimidex.  Therefore, I has escribed Tamoxifen to her pharmacy.  She will needs labs in 8 weeks: CBC diff, CMET and follow-up as scheduled.  ------

## 2013-04-18 NOTE — Progress Notes (Signed)
Message from Dr. Jeanice Lim, patient's PCP, reporting that patient's arthralgias and myalgias are resolved now that she has been off Arimidex x 3 weeks. This is a class side effect of aromatase inhibitors and therefore we will switch her to Tamoxifen.  She will complete 5 years worth of endocrine therapy as planned in Feb/March 2018.  Tamoxifen escribed to her pharmacy and Arimidex D/C's from medication list.  Dellis Anes

## 2013-04-18 NOTE — Telephone Encounter (Signed)
Patient notified and verbalized understanding of instructions.  Labs scheduled for 06/14/13.

## 2013-04-20 ENCOUNTER — Other Ambulatory Visit (HOSPITAL_COMMUNITY): Payer: Self-pay | Admitting: Oncology

## 2013-04-20 DIAGNOSIS — C50912 Malignant neoplasm of unspecified site of left female breast: Secondary | ICD-10-CM

## 2013-04-20 MED ORDER — TAMOXIFEN CITRATE 20 MG PO TABS: 20.0000 mg | ORAL_TABLET | Freq: Every day | ORAL | Status: DC

## 2013-04-24 DIAGNOSIS — M25569 Pain in unspecified knee: Secondary | ICD-10-CM | POA: Diagnosis not present

## 2013-05-07 ENCOUNTER — Ambulatory Visit: Payer: Self-pay | Admitting: Family Medicine

## 2013-05-09 ENCOUNTER — Ambulatory Visit (INDEPENDENT_AMBULATORY_CARE_PROVIDER_SITE_OTHER): Payer: Medicare Other | Admitting: Family Medicine

## 2013-05-09 ENCOUNTER — Encounter: Payer: Self-pay | Admitting: Family Medicine

## 2013-05-09 VITALS — BP 130/60 | HR 60 | Temp 98.3°F | Resp 18 | Ht 63.0 in | Wt 250.0 lb

## 2013-05-09 DIAGNOSIS — N898 Other specified noninflammatory disorders of vagina: Secondary | ICD-10-CM

## 2013-05-09 DIAGNOSIS — E1142 Type 2 diabetes mellitus with diabetic polyneuropathy: Secondary | ICD-10-CM

## 2013-05-09 DIAGNOSIS — L84 Corns and callosities: Secondary | ICD-10-CM

## 2013-05-09 DIAGNOSIS — E119 Type 2 diabetes mellitus without complications: Secondary | ICD-10-CM

## 2013-05-09 DIAGNOSIS — N8110 Cystocele, unspecified: Secondary | ICD-10-CM

## 2013-05-09 DIAGNOSIS — N8111 Cystocele, midline: Secondary | ICD-10-CM

## 2013-05-09 DIAGNOSIS — E1149 Type 2 diabetes mellitus with other diabetic neurological complication: Secondary | ICD-10-CM

## 2013-05-09 DIAGNOSIS — R102 Pelvic and perineal pain: Secondary | ICD-10-CM

## 2013-05-09 DIAGNOSIS — E1140 Type 2 diabetes mellitus with diabetic neuropathy, unspecified: Secondary | ICD-10-CM | POA: Insufficient documentation

## 2013-05-09 LAB — WET PREP FOR TRICH, YEAST, CLUE
Clue Cells Wet Prep HPF POC: NONE SEEN
Trich, Wet Prep: NONE SEEN
WBC WET PREP: NONE SEEN
YEAST WET PREP: NONE SEEN

## 2013-05-09 NOTE — Progress Notes (Signed)
   Subjective:    Patient ID: Julie Clay, female    DOB: 30-Dec-1940, 73 y.o.   MRN: [id number]  HPI Patient here for an examination so that she can have diabetic shoes and custom orthotics. She was violated by podiatry secondary to pre-ulcerative callus on her right foot as well as long thickened toenails and some mild neuropathy in the great toes. She has diabetes mellitus which is fairly well-controlled with a recent A1c of less than 8%   She's also concerned about pelvic pain she's not had any vaginal bleeding but has had mild discharge. She states that she gets sharp pain and feels like there is something dropping from her pelvis and she would like to have this evaluated. She's only had her tubes tied in the past    Review of Systems  GEN- denies fatigue, fever, weight loss,weakness, recent illness HEENT- denies eye drainage, change in vision, nasal discharge, CVS- denies chest pain, palpitations RESP- denies SOB, cough, wheeze  Neuro- denies headache, dizziness, syncope, seizure activity      Objective:   Physical Exam GEN-NAD,alert and oriented x 3 EXT- SEE detailed foot exam, no edema Pulse 2+ DP GU- normal external genitalia, vaginal mucosa pink and moist, cervix visualized no growth, no blood form os, minimal thin clear discharge, no CMT, ovaries not palpable, uterus normal size, + bladder prolapse, small rectocele felt        Assessment & Plan:

## 2013-05-09 NOTE — Assessment & Plan Note (Signed)
A1C 7.2%, currently at goal for patient

## 2013-05-09 NOTE — Patient Instructions (Signed)
Referral for pelvic ultrasound Diabetic shoes and orthotics to be ordered F/u as previous

## 2013-05-09 NOTE — Assessment & Plan Note (Signed)
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

## 2013-05-09 NOTE — Assessment & Plan Note (Signed)
With recurrent pelvic pain and history of breast cancer on tamoxifen currently Obtain Pelvic Ultrasound I think some discomfort is due to bladder prolapse as well

## 2013-05-10 ENCOUNTER — Other Ambulatory Visit: Payer: Medicare Other

## 2013-05-11 ENCOUNTER — Ambulatory Visit
Admission: RE | Admit: 2013-05-11 | Discharge: 2013-05-11 | Disposition: A | Discharge status: Home / Self Care | Payer: Medicare Other | Source: Ambulatory Visit | Attending: Family Medicine | Admitting: Family Medicine

## 2013-05-11 DIAGNOSIS — D259 Leiomyoma of uterus, unspecified: Secondary | ICD-10-CM | POA: Diagnosis not present

## 2013-05-11 DIAGNOSIS — R102 Pelvic and perineal pain: Secondary | ICD-10-CM

## 2013-05-14 ENCOUNTER — Telehealth: Payer: Self-pay | Admitting: Family Medicine

## 2013-05-14 NOTE — Telephone Encounter (Signed)
Pt had ultrasound done on Friday and she is calling to see about her results Call back number is [phone]

## 2013-05-15 ENCOUNTER — Other Ambulatory Visit: Payer: Self-pay | Admitting: *Deleted

## 2013-05-15 DIAGNOSIS — M25559 Pain in unspecified hip: Secondary | ICD-10-CM

## 2013-05-15 DIAGNOSIS — D259 Leiomyoma of uterus, unspecified: Secondary | ICD-10-CM

## 2013-05-15 NOTE — Telephone Encounter (Signed)
Pt aware of results 

## 2013-05-16 ENCOUNTER — Other Ambulatory Visit: Payer: Self-pay | Admitting: Family Medicine

## 2013-05-17 NOTE — Telephone Encounter (Signed)
Medication refilled per protocol. 

## 2013-05-23 ENCOUNTER — Other Ambulatory Visit (HOSPITAL_COMMUNITY)
Admission: RE | Admit: 2013-05-23 | Discharge: 2013-05-23 | Disposition: A | Discharge status: Home / Self Care | Payer: Medicare Other | Source: Ambulatory Visit | Attending: Obstetrics and Gynecology | Admitting: Obstetrics and Gynecology

## 2013-05-23 ENCOUNTER — Ambulatory Visit (INDEPENDENT_AMBULATORY_CARE_PROVIDER_SITE_OTHER): Payer: Medicare Other | Admitting: Obstetrics and Gynecology

## 2013-05-23 ENCOUNTER — Encounter: Payer: Self-pay | Admitting: Obstetrics and Gynecology

## 2013-05-23 ENCOUNTER — Other Ambulatory Visit (HOSPITAL_COMMUNITY): Payer: Self-pay | Admitting: Oncology

## 2013-05-23 VITALS — BP 122/80 | Ht 65.0 in | Wt 252.0 lb

## 2013-05-23 DIAGNOSIS — Z1151 Encounter for screening for human papillomavirus (HPV): Secondary | ICD-10-CM | POA: Insufficient documentation

## 2013-05-23 DIAGNOSIS — Z124 Encounter for screening for malignant neoplasm of cervix: Secondary | ICD-10-CM

## 2013-05-23 DIAGNOSIS — Z853 Personal history of malignant neoplasm of breast: Secondary | ICD-10-CM

## 2013-05-23 DIAGNOSIS — N816 Rectocele: Secondary | ICD-10-CM | POA: Diagnosis not present

## 2013-05-23 DIAGNOSIS — Z9889 Other specified postprocedural states: Secondary | ICD-10-CM

## 2013-05-23 DIAGNOSIS — C50919 Malignant neoplasm of unspecified site of unspecified female breast: Secondary | ICD-10-CM

## 2013-05-23 NOTE — Patient Instructions (Signed)
Rectocele/Enterocele, Care After A woman's birth canal (vagina) can become weak or stretched. This can be caused by childbirth, heavy lifting, lasting (chronic) constipation, aging, or pelvic surgery. When the vagina is weak and stretched, parts of the intestine can bulge into the vagina by pushing against the vaginal walls. A rectocele is when the very end of the large intestine (rectum) causes the bulge. An enterocele is when the small intestine causes the bulge. Surgery to fix this problem is usually done through the vagina. If you just had this surgery, you were probably given a drug to make you sleep (general anesthetic) or a drug that numbs you from the waist down (spinal/epidural). Here is what happened:  The small intestine or rectum was pushed back to its normal place.  The vaginal wall was made stronger. Sometimes this is done with stitches or a mesh-like material. HOME CARE INSTRUCTIONS  Some women go home the same day as their surgery. Others stay in the hospital for a few days. This depends on the size and type of repair.  Pain and Medications  Some pain is normal after this surgery. Only take pain medicine your surgeon prescribed. Follow the directions carefully.  Do not take aspirin. It can cause bleeding.  Do not drink alcohol while taking pain medication.  You may be given a medicine (antibiotic) that kills germs. Follow the directions carefully.  Take warm sitz baths 2 times a day to control discomfort and reduce any swelling. Take sitz baths with your caregiver's permission. Diet  Go back to your normal eating as directed by your caregiver.  Drink a lot of fluids. Drink at least 6 glasses of water every day. Activity  Move around and walk as much as possible. This can keep blood clots from forming in your legs.  Do not climb stairs until your caregiver says it is okay.  Do not lift objects 5 pounds (2.3 kg) or heavier. Do not bend or strain for 6 to 8 weeks.  Do  not drive until after you stop taking pain medicine and your caregiver says it is okay.  Your return to work will depend on the type of work you do. Ask your caregiver what is best for you.  Ask your caregiver when you can resume sexual activity. Most women can start having sex in about 6 weeks after their surgery.  Get plenty of rest during the day and sleep at night.  Have someone help you with your household chores and activities for 3 to 4 weeks. Other Precautions  You may have some discharge from the vagina for a few weeks after the surgery. It may have small amounts of blood in it. This is normal. If you have questions, ask your caregiver.  Do not use tampons or douche.  You should be able to take a shower a day after your surgery. Do not take a tub bath for at least a week.  Take it easy for awhile. You should feel much better in 2 to 3 weeks. It may take up to 6 weeks to feel completely normal.  Keep all follow-up appointments.  Take your temperature twice a day and write it down.  Make sure your family understands everything about your surgery and recovery. SEEK MEDICAL CARE IF:   You have any questions about your medication, or you need stronger pain medication.  Pain continues, even after taking pain medication.  You become constipated.  You have an oral temperature above 102 F (38.9 C).    You develop swelling and redness in the surgery area.  You become dizzy or lightheaded.  You feel sick to your stomach (nauseous), throw up (vomit), or have diarrhea.  You develop a rash.  You have a reaction to your medications. SEEK IMMEDIATE MEDICAL CARE IF:   Pain gets worse.  You have new bleeding from your vagina.  Discharge from the vagina becomes heavy, or it has a bad smell.  You have an oral temperature above 102 F (38.9 C), not controlled by medicine.  You develop belly (abdominal) pain.  You develop chest pain.  You develop shortness of  breath.  You pass out (faint).  You develop pain, swelling, or redness in the leg.  You have pain or burning with urination.  You have bloody urine or cannot urinate. MAKE SURE YOU:   Understand these instructions.  Will watch your condition.  Will get help right away if you are not doing well or get worse. Document Released: 06/30/2009 Document Revised: 01/24/2013 Document Reviewed: 06/30/2009 [employer] Patient Information 2014 [employer].  

## 2013-05-23 NOTE — Progress Notes (Signed)
This chart was scribed by Jenne Campus, Medical Scribe, for Dr. Mallory Shirk on @DATE @ at 12:01 PM. This chart was reviewed by Dr. Mallory Shirk and is accurate.    [facility] Visit  Patient name: Julie Clay MRN [id number]  Date of birth: 11/03/1940  CC & HPI:  Julie Clay is a 73 y.o. female presenting today for ?bladder drop that was diagnosed at her PCP's office.  States that she can feel "my uterus" when she sits down which is new. +constipation, improves with stool softener. Denies any bladder issues or vaginal issues. Denies any incontinence with coughing or laughing. She denies any recent PAP smear. On tamoxifen for the past month for breast CA tx. Sexual activity once a year. G2P2 ROS:  Recent weight loss, concerns about "hanging skin" due to prior c-section +constipation Denies any urinary or vaginal symptoms   Pertinent History Reviewed:  Medical & Surgical Hx:  Reviewed: Significant for fibroids, prior c-section Medications: Reviewed & Updated - see associated section Social History: Reviewed -  reports that she has been passively smoking.  She has never used smokeless tobacco.  Objective Findings:  Vitals: BP 122/80  Ht 5\' 5"  (1.651 m)  Wt 252 lb (114.306 kg)  BMI 41.93 kg/m2 Chaperone present for exam. Exam performed with pt's permission Physical Examination: General appearance - alert, well appearing, and in no distress, oriented to person, place, and time and overweight Mental status - alert, oriented to person, place, and time, normal mood, behavior, speech, dress, motor activity, and thought processes Abdominal: small umbilical hernia, easily reducible  Pelvic - Anterior exam: No cystocele, cervix is normal, uterus:see [place], ?tiny polyps, 4mm endometrium thickness consistent with tamoxifen therapy and two possible polyps Posterior Exam: rectocele to endrotus  External hemorrhoid, good anterior support   CLINICAL DATA: Left lower quadrant pain,  history of fibroids,  history of breast cancer  EXAM:  TRANSABDOMINAL AND TRANSVAGINAL ULTRASOUND OF PELVIS  TECHNIQUE:  Both transabdominal and transvaginal ultrasound examinations of the  pelvis were performed. Transabdominal technique was performed for  global imaging of the pelvis including uterus, ovaries, adnexal  regions, and pelvic cul-de-sac. It was necessary to proceed with  endovaginal exam following the transabdominal exam to visualize the  bilateral adnexae.  COMPARISON: None  FINDINGS:  Uterus  Measurements: 10.7 x 4.2 x 5.1 cm. Retroflexed. Multiple uterine  fibroids, including a dominant subserosal 2.8 x 2.3 x 3.0 cm fibroid  in the left uterine fundus. .  Endometrium  Thickness: 7 mm. Two hyperechoic foci along the endometrium,  measuring 7 x 4 x 6 mm (image 24) and 7 x 4 x 4 mm (image 20), with  associated color Doppler flow, possibly reflecting endometrial  polyps.  Right ovary  Not visualized transabdominally or transvaginally.  Left ovary  Not visualized transabdominally or transvaginally.  Other findings  No free fluid.  IMPRESSION:  Multiple uterine fibroids, measuring up to 3.0 cm.  Endometrium measures 7 mm, mildly prominent, with two possible  endometrial polyps measuring up to 7 mm.  Bilateral ovaries are not discretely visualized.  Electronically Signed  By: Julian Hy M.D.  On: 05/11/2013 14:39  Assessment & Plan:  Grade 2 Rectocele, chronic constipation  Endometrial biopsy for polyps, posterior repair discussed with medical explainer. Pt states that she needs to attend to personal affairs first. Will f/u in 4 weeks

## 2013-06-06 ENCOUNTER — Other Ambulatory Visit: Payer: Medicare Other | Admitting: Obstetrics and Gynecology

## 2013-06-07 ENCOUNTER — Other Ambulatory Visit: Payer: Self-pay | Admitting: Family Medicine

## 2013-06-13 ENCOUNTER — Ambulatory Visit (HOSPITAL_COMMUNITY)
Admission: RE | Admit: 2013-06-13 | Discharge: 2013-06-13 | Disposition: A | Discharge status: Home / Self Care | Payer: Medicare Other | Source: Ambulatory Visit | Attending: Oncology | Admitting: Oncology

## 2013-06-13 ENCOUNTER — Encounter (HOSPITAL_COMMUNITY): Payer: Medicare Other

## 2013-06-13 ENCOUNTER — Encounter (HOSPITAL_COMMUNITY): Payer: Medicare Other | Attending: Hematology and Oncology

## 2013-06-13 ENCOUNTER — Other Ambulatory Visit: Payer: Self-pay | Admitting: Family Medicine

## 2013-06-13 ENCOUNTER — Other Ambulatory Visit (HOSPITAL_COMMUNITY): Payer: Self-pay | Admitting: Oncology

## 2013-06-13 DIAGNOSIS — M1710 Unilateral primary osteoarthritis, unspecified knee: Secondary | ICD-10-CM | POA: Diagnosis not present

## 2013-06-13 DIAGNOSIS — N63 Unspecified lump in unspecified breast: Secondary | ICD-10-CM | POA: Diagnosis not present

## 2013-06-13 DIAGNOSIS — Z9889 Other specified postprocedural states: Secondary | ICD-10-CM | POA: Diagnosis not present

## 2013-06-13 DIAGNOSIS — R922 Inconclusive mammogram: Secondary | ICD-10-CM | POA: Diagnosis not present

## 2013-06-13 DIAGNOSIS — C50919 Malignant neoplasm of unspecified site of unspecified female breast: Secondary | ICD-10-CM | POA: Diagnosis not present

## 2013-06-13 DIAGNOSIS — IMO0002 Reserved for concepts with insufficient information to code with codable children: Secondary | ICD-10-CM | POA: Diagnosis not present

## 2013-06-13 DIAGNOSIS — R921 Mammographic calcification found on diagnostic imaging of breast: Secondary | ICD-10-CM

## 2013-06-13 DIAGNOSIS — C50912 Malignant neoplasm of unspecified site of left female breast: Secondary | ICD-10-CM

## 2013-06-13 DIAGNOSIS — E876 Hypokalemia: Secondary | ICD-10-CM

## 2013-06-13 LAB — COMPREHENSIVE METABOLIC PANEL
ALK PHOS: 52 U/L (ref 39–117)
ALT: 25 U/L (ref 0–35)
AST: 22 U/L (ref 0–37)
Albumin: 3.5 g/dL (ref 3.5–5.2)
BUN: 27 mg/dL — ABNORMAL HIGH (ref 6–23)
CHLORIDE: 102 meq/L (ref 96–112)
CO2: 30 meq/L (ref 19–32)
Calcium: 9.6 mg/dL (ref 8.4–10.5)
Creatinine, Ser: 1.05 mg/dL (ref 0.50–1.10)
GFR, EST AFRICAN AMERICAN: 61 mL/min — AB (ref >90)
GFR, EST NON AFRICAN AMERICAN: 53 mL/min — AB (ref >90)
GLUCOSE: 204 mg/dL — AB (ref 70–99)
POTASSIUM: 3.5 meq/L — AB (ref 3.7–5.3)
Sodium: 144 mEq/L (ref 137–147)
Total Bilirubin: 0.3 mg/dL (ref 0.3–1.2)
Total Protein: 7.5 g/dL (ref 6.0–8.3)

## 2013-06-13 LAB — CBC WITH DIFFERENTIAL/PLATELET
BASOS ABS: 0 10*3/uL (ref 0.0–0.1)
Basophils Relative: 0 % (ref 0–1)
Eosinophils Absolute: 0.3 10*3/uL (ref 0.0–0.7)
Eosinophils Relative: 3 % (ref 0–5)
HCT: 39.6 % (ref 36.0–46.0)
Hemoglobin: 12.9 g/dL (ref 12.0–15.0)
LYMPHS ABS: 1.7 10*3/uL (ref 0.7–4.0)
LYMPHS PCT: 18 % (ref 12–46)
MCH: 26.9 pg (ref 26.0–34.0)
MCHC: 32.6 g/dL (ref 30.0–36.0)
MCV: 82.7 fL (ref 78.0–100.0)
Monocytes Absolute: 0.6 10*3/uL (ref 0.1–1.0)
Monocytes Relative: 6 % (ref 3–12)
NEUTROS ABS: 7 10*3/uL (ref 1.7–7.7)
NEUTROS PCT: 73 % (ref 43–77)
PLATELETS: 264 10*3/uL (ref 150–400)
RBC: 4.79 MIL/uL (ref 3.87–5.11)
RDW: 14.3 % (ref 11.5–15.5)
WBC: 9.5 10*3/uL (ref 4.0–10.5)

## 2013-06-13 MED ORDER — POTASSIUM CHLORIDE CRYS ER 20 MEQ PO TBCR: 20.0000 meq | EXTENDED_RELEASE_TABLET | Freq: Two times a day (BID) | ORAL | Status: DC

## 2013-06-13 NOTE — Progress Notes (Signed)
Labs drawn today for cbc/diff,cmp 

## 2013-06-14 ENCOUNTER — Other Ambulatory Visit (HOSPITAL_COMMUNITY): Payer: Medicare Other

## 2013-06-22 ENCOUNTER — Ambulatory Visit
Admission: RE | Admit: 2013-06-22 | Discharge: 2013-06-22 | Disposition: A | Discharge status: Home / Self Care | Payer: Medicare Other | Source: Ambulatory Visit | Attending: Family Medicine | Admitting: Family Medicine

## 2013-06-22 DIAGNOSIS — R921 Mammographic calcification found on diagnostic imaging of breast: Secondary | ICD-10-CM

## 2013-06-22 DIAGNOSIS — N6089 Other benign mammary dysplasias of unspecified breast: Secondary | ICD-10-CM | POA: Diagnosis not present

## 2013-06-22 DIAGNOSIS — R928 Other abnormal and inconclusive findings on diagnostic imaging of breast: Secondary | ICD-10-CM | POA: Diagnosis not present

## 2013-06-22 HISTORY — PX: BREAST BIOPSY: SHX20

## 2013-06-25 ENCOUNTER — Ambulatory Visit (INDEPENDENT_AMBULATORY_CARE_PROVIDER_SITE_OTHER): Payer: Medicare Other | Admitting: Obstetrics and Gynecology

## 2013-06-25 ENCOUNTER — Encounter: Payer: Self-pay | Admitting: Obstetrics and Gynecology

## 2013-06-25 VITALS — BP 138/62 | Ht 65.0 in | Wt 246.6 lb

## 2013-06-25 DIAGNOSIS — N816 Rectocele: Secondary | ICD-10-CM

## 2013-06-25 DIAGNOSIS — N840 Polyp of corpus uteri: Secondary | ICD-10-CM

## 2013-06-25 HISTORY — DX: Rectocele: N81.6

## 2013-06-25 NOTE — Progress Notes (Signed)
This chart was scribed by Ludger Nutting, Medical Scribe, for Dr. Mallory Shirk on 06/25/13 at 4:26 PM. This chart was reviewed by Dr. Mallory Shirk and is accurate.    [facility] Visit  Patient name: Julie Clay MRN [id number]  Date of birth: Aug 21, 1940  CC & HPI:  MARGRETTE WYNIA is a 73 y.o. female presenting today for discussion of timing of endometrial polypectomy and rectocele repair. Pt has breast surgery and knee surgery that she considers more important and I support that decision. Postpone 6 months and reassess.   ROS:  Negative except listed above  Pertinent History Reviewed:  Medical & Surgical Hx:  Reviewed: Significant for  Past Medical History  Diagnosis Date  . Hypertension   . Hyperlipidemia   . Diabetes mellitus   . Asthma     spirometry (2011)- mild ventilary defect  . Cancer 2012    Breast  . Arthritis     Osteoarthritis  . Fatty liver 2013    enlarged  . Sliding hiatal hernia   . Ventral hernia, unspecified, without mention of obstruction or gangrene   . Lung nodule seen on imaging study 2013  . HSV (herpes simplex virus) infection   . Lymphedema     Left arm  . 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 Calcification of left breast   . Arthritis      Past Surgical History  Procedure Laterality Date  . Cesarean section    . 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 Breast surgery  jan 2013  . Hand ligament reconstruction    . Cataract extraction, bilateral      on different occassions   . Shoulder arthroscopy with open rotator cuff repair Left 07/26/2012  . Breast biopsy  06/22/13    Medications: Reviewed & Updated - see associated section Social History: Reviewed -  reports that she has been passively smoking.  She has never used smokeless tobacco.  Objective Findings:  Vitals: BP 138/62  Ht 5\' 5"  (1.651 m)  Wt 246 lb 9.6 oz (111.857 kg)  BMI 41.04 kg/m2  Physical Examination: General appearance - alert, well  appearing, and in no distress and oriented to person, place, and time Pelvic - examination not indicated   Assessment & Plan:   A: 1. Endometrial polyp 2. Rectocele    P: 1. Follow up in 6 months for discussion of rectocele repair and endometrial polypectomy.

## 2013-07-11 ENCOUNTER — Ambulatory Visit (INDEPENDENT_AMBULATORY_CARE_PROVIDER_SITE_OTHER): Payer: Medicare Other | Admitting: Family Medicine

## 2013-07-11 ENCOUNTER — Encounter: Payer: Self-pay | Admitting: Family Medicine

## 2013-07-11 VITALS — BP 138/62 | HR 78 | Temp 98.1°F | Resp 16 | Ht 65.0 in | Wt 250.0 lb

## 2013-07-11 DIAGNOSIS — E876 Hypokalemia: Secondary | ICD-10-CM

## 2013-07-11 DIAGNOSIS — N6089 Other benign mammary dysplasias of unspecified breast: Secondary | ICD-10-CM | POA: Diagnosis not present

## 2013-07-11 DIAGNOSIS — N816 Rectocele: Secondary | ICD-10-CM | POA: Diagnosis not present

## 2013-07-11 DIAGNOSIS — N840 Polyp of corpus uteri: Secondary | ICD-10-CM | POA: Diagnosis not present

## 2013-07-11 DIAGNOSIS — N6092 Unspecified benign mammary dysplasia of left breast: Secondary | ICD-10-CM

## 2013-07-11 DIAGNOSIS — M1990 Unspecified osteoarthritis, unspecified site: Secondary | ICD-10-CM

## 2013-07-11 MED ORDER — TRIAMTERENE-HCTZ 75-50 MG PO TABS: ORAL_TABLET | ORAL | Status: DC

## 2013-07-11 NOTE — Patient Instructions (Signed)
Get the labs done fasting Proceed with breast surgeon  Change f/u to 3 months

## 2013-07-12 ENCOUNTER — Encounter: Payer: Self-pay | Admitting: Family Medicine

## 2013-07-12 DIAGNOSIS — N6092 Unspecified benign mammary dysplasia of left breast: Secondary | ICD-10-CM | POA: Insufficient documentation

## 2013-07-12 NOTE — Assessment & Plan Note (Signed)
At this time I think she can hold off on a rectocele repair she states is not causing her any discomfort she does not want to proceed with the surgery

## 2013-07-12 NOTE — Progress Notes (Signed)
Patient ID: Julie Clay, female   DOB: Jul 24, 1940, 73 y.o.   MRN: [id number]     Subjective:    Patient ID: Julie Clay, female    DOB: November 27, 1940, 73 y.o.   MRN: [id number]  Patient presents for second opinion of referrals  patient here to discuss some of her upcoming specialist appointments. She had a recent mammogram which showed calcifications on the left breast which is the same side she previous breast cancer in. A biopsy was done which showed atypical ductal hyperplasia and they were with advising that she have lobectomy to remove these calcifications. She was there weary on this and wanted my opinion on it  She was also seen by orthopedics secondary to osteoarthritis of the knee was told that she needs arthroscopic however she was given a steroid injection in her knee is much improved and she wants to hold off on this  She was seen by GYN secondary to her abnormal vaginal exam because of her thickened endometrium and her history of cancer she was advised to need endometrial biopsy there also polyps seen and these would need to be removed rectocele was also diagnosis of bladder prolapse and she was advised that she would need repair of this at the same time. She does not want to undergo the rectocele repair at this time  She's also need a repeat BMET as her labs at the oncology Center showed hypokalemia    Review Of Systems:  GEN- denies fatigue, fever, weight loss,weakness, recent illness HEENT- denies eye drainage, change in vision, nasal discharge, CVS- denies chest pain, palpitations RESP- denies SOB, cough, wheeze ABD- denies N/V, change in stools, abd pain GU- denies dysuria, hematuria, dribbling, incontinence MSK- denies joint pain, muscle aches, injury Neuro- denies headache, dizziness, syncope, seizure activity       Objective:    BP 138/62  Pulse 78  Temp(Src) 98.1 F (36.7 C) (Oral)  Resp 16  Ht 5\' 5"  (1.651 m)  Wt 250 lb (113.399 kg)  BMI 41.60 kg/m2 GEN-  NAD, alert and oriented x3 MSK- mild swelling Left knee, fair ROM Ext- trace edema, pulses equal bilat        Assessment & Plan:      Problem List Items Addressed This Visit   None    Visit Diagnoses   Hypokalemia    -  Primary    Relevant Orders       Magnesium       Note: This dictation was prepared with Dragon dictation along with smaller phrase technology. Any transcriptional errors that result from this process are unintentional.

## 2013-07-12 NOTE — Assessment & Plan Note (Signed)
In setting of her cancer history in the abnormal thickening on the ultrasound as well as the polyps I recommend that she go ahead and have endometrial biopsy she states she will proceed with this

## 2013-07-12 NOTE — Assessment & Plan Note (Signed)
We will hold off on surgical intervention for the knee she has done well with the injection

## 2013-07-12 NOTE — Assessment & Plan Note (Signed)
Based on her history of breast cancer and these abnormal cells noted in the same breast her recommend that she go ahead and proceed with the surgery that has been set up. She agrees to this her questions were answered with her husband

## 2013-07-13 ENCOUNTER — Other Ambulatory Visit: Payer: Self-pay | Admitting: Family Medicine

## 2013-07-13 DIAGNOSIS — I10 Essential (primary) hypertension: Secondary | ICD-10-CM | POA: Diagnosis not present

## 2013-07-13 DIAGNOSIS — E785 Hyperlipidemia, unspecified: Secondary | ICD-10-CM | POA: Diagnosis not present

## 2013-07-13 DIAGNOSIS — E119 Type 2 diabetes mellitus without complications: Secondary | ICD-10-CM | POA: Diagnosis not present

## 2013-07-13 LAB — CBC WITH DIFFERENTIAL/PLATELET
BASOS ABS: 0 10*3/uL (ref 0.0–0.1)
Basophils Relative: 0 % (ref 0–1)
EOS ABS: 0.2 10*3/uL (ref 0.0–0.7)
Eosinophils Relative: 3 % (ref 0–5)
HCT: 36.7 % (ref 36.0–46.0)
Hemoglobin: 12.2 g/dL (ref 12.0–15.0)
Lymphocytes Relative: 29 % (ref 12–46)
Lymphs Abs: 2.1 10*3/uL (ref 0.7–4.0)
MCH: 26.4 pg (ref 26.0–34.0)
MCHC: 33.2 g/dL (ref 30.0–36.0)
MCV: 79.4 fL (ref 78.0–100.0)
Monocytes Absolute: 0.6 10*3/uL (ref 0.1–1.0)
Monocytes Relative: 9 % (ref 3–12)
NEUTROS ABS: 4.2 10*3/uL (ref 1.7–7.7)
Neutrophils Relative %: 59 % (ref 43–77)
Platelets: 250 10*3/uL (ref 150–400)
RBC: 4.62 MIL/uL (ref 3.87–5.11)
RDW: 15 % (ref 11.5–15.5)
WBC: 7.2 10*3/uL (ref 4.0–10.5)

## 2013-07-13 LAB — HEMOGLOBIN A1C
HEMOGLOBIN A1C: 6.9 % — AB (ref <5.7)
Mean Plasma Glucose: 151 mg/dL — ABNORMAL HIGH (ref <117)

## 2013-07-13 LAB — MAGNESIUM: MAGNESIUM: 1.8 mg/dL (ref 1.5–2.5)

## 2013-07-13 LAB — LIPID PANEL
Cholesterol: 157 mg/dL (ref 0–200)
HDL: 46 mg/dL (ref >39)
LDL Cholesterol: 68 mg/dL (ref 0–99)
TRIGLYCERIDES: 213 mg/dL — AB (ref <150)
Total CHOL/HDL Ratio: 3.4 Ratio
VLDL: 43 mg/dL — AB (ref 0–40)

## 2013-07-16 ENCOUNTER — Other Ambulatory Visit: Payer: Self-pay | Admitting: Family Medicine

## 2013-07-16 LAB — BASIC METABOLIC PANEL
BUN: 23 mg/dL (ref 6–23)
CHLORIDE: 106 meq/L (ref 96–112)
CO2: 28 meq/L (ref 19–32)
CREATININE: 1.09 mg/dL (ref 0.50–1.10)
Calcium: 9.2 mg/dL (ref 8.4–10.5)
Glucose, Bld: 129 mg/dL — ABNORMAL HIGH (ref 70–99)
Potassium: 4.3 mEq/L (ref 3.5–5.3)
Sodium: 144 mEq/L (ref 135–145)

## 2013-07-16 NOTE — Telephone Encounter (Signed)
Refill appropriate and filled per protocol. 

## 2013-07-17 DIAGNOSIS — D249 Benign neoplasm of unspecified breast: Secondary | ICD-10-CM | POA: Diagnosis not present

## 2013-07-19 ENCOUNTER — Telehealth: Payer: Self-pay | Admitting: *Deleted

## 2013-07-19 NOTE — Telephone Encounter (Signed)
Received call from patient.   Reports that she thinks she has bronchitis.   Cough noted while speaking to patient. No mucus has been produced. No nasal drainage noted.   Advised to use OTC Mucinex DM and to cal back if she's not any better by Friday, 07/20/2013.

## 2013-07-20 ENCOUNTER — Telehealth: Payer: Self-pay | Admitting: Family Medicine

## 2013-07-20 NOTE — Telephone Encounter (Signed)
Okay to send zpak, continue mucinex DM

## 2013-07-20 NOTE — Telephone Encounter (Signed)
Still c/o bronchitis.  Has cough, non-productive.  Feels congested but can't get out.  Mucinex not working.  Only seems to be in chest.  Wants antibiotic.

## 2013-07-23 ENCOUNTER — Other Ambulatory Visit: Payer: Self-pay | Admitting: *Deleted

## 2013-07-23 MED ORDER — AZITHROMYCIN 250 MG PO TABS: ORAL_TABLET | ORAL | Status: DC

## 2013-07-23 NOTE — Telephone Encounter (Signed)
Z-pack sent to pharmacy per orders on 07/20/2013.

## 2013-07-25 ENCOUNTER — Ambulatory Visit: Payer: Federal, State, Local not specified - PPO | Admitting: Family Medicine

## 2013-07-28 ENCOUNTER — Other Ambulatory Visit: Payer: Self-pay | Admitting: Family Medicine

## 2013-07-30 NOTE — Telephone Encounter (Signed)
Refill appropriate and filled per protocol. 

## 2013-08-14 ENCOUNTER — Other Ambulatory Visit (HOSPITAL_COMMUNITY): Payer: Self-pay | Admitting: Oncology

## 2013-08-14 ENCOUNTER — Other Ambulatory Visit: Payer: Self-pay | Admitting: Family Medicine

## 2013-08-14 DIAGNOSIS — C50912 Malignant neoplasm of unspecified site of left female breast: Secondary | ICD-10-CM

## 2013-08-14 MED ORDER — TAMOXIFEN CITRATE 20 MG PO TABS: 20.0000 mg | ORAL_TABLET | Freq: Every day | ORAL | Status: DC

## 2013-08-14 NOTE — Telephone Encounter (Signed)
Medication refilled per protocol. 

## 2013-08-30 ENCOUNTER — Encounter (HOSPITAL_COMMUNITY): Payer: Self-pay | Admitting: Pharmacy Technician

## 2013-08-30 ENCOUNTER — Other Ambulatory Visit (HOSPITAL_COMMUNITY): Payer: Self-pay | Admitting: General Surgery

## 2013-08-30 DIAGNOSIS — D249 Benign neoplasm of unspecified breast: Secondary | ICD-10-CM

## 2013-09-03 NOTE — H&P (Signed)
  NTS SOAP Note  Vital Signs:  Vitals as of: 3/43/5686: Systolic 168: Diastolic 68: Heart Rate 101: Temp 81F: Height 43ft 5in: Weight 250Lbs 0 Ounces: BMI 41.6  BMI : 41.6 kg/m2  Subjective: This 73 Years 64 Months old Female presents for an abnormal left mammogram.  Had left breast biopsy which reveals atypical cells.  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  Treated with chemotherapy, no XRT.  No family h/o breast cancer.  No nipple discharge noted.  No mass noted.  Referred for formal biopsy.  Review of Symptoms:    thirst, fatigue Head:unremarkable    Eyes:unremarkable   Nose/Mouth/Throat:unremarkable Cardiovascular:  unremarkable   Respiratory:  dyspnea,wheezing Gastrointestinal:  unremarkable   Genitourinary:unremarkable       joint and back pain Skin:unremarkable   as above Hematolgic/Lymphatic:unremarkable     Allergic/Immunologic:unremarkable     Past Medical History:    Reviewed  Past Medical History  Surgical History: left partial mastectomy with sentinel lymph node biopsy Medical Problems: DCIS left breast, NIDDM, high cholesterol, COPD Allergies: tetracycline Medications: dulera, singulair, januvia, glucophage, glucotrol, maxzide, zocor, albuterol, zovirax, mortin, naproxen   Social History:Reviewed  Social History  Preferred Language: English Race:  White Ethnicity: Not Hispanic / Latino Age: 73 Years 10 Months Marital Status:  M   Smoking Status: Never smoker reviewed on 07/17/2013 Functional Status reviewed on 07/17/2013 ------------------------------------------------ Bathing: Normal Cooking: Normal Dressing: Normal Driving: Normal Eating: Normal Managing Meds: Normal Oral Care: Normal Shopping: Normal Toileting: Normal Transferring: Normal Walking: Normal Cognitive Status reviewed on 07/17/2013 ------------------------------------------------ Attention: Normal Decision Making: Normal Language:  Normal Memory: Normal Motor: Normal Perception: Normal Problem Solving: Normal Visual and Spatial: Normal   Family History:  Reviewed  Family Health History Mother, Deceased; Urinary bladder cancer;  Father, Deceased; Stomach cancer;     Objective Information: General:  Well appearing, well nourished in no distress. Neck:  Supple without lymphadenopathy.  Heart:  RRR, no murmur Lungs:    CTA bilaterally, no wheezes, rhonchi, rales.  Breathing unlabored.       No dominant mass, nipple discharge, dimpling.  Axilla negative for palpable nodes.  Assessment:Left breast neoplasm  Diagnoses: 217 Benign tumor of breast (Benign neoplasm of left breast)  Procedures: 37290 - OFFICE OUTPATIENT NEW 30 MINUTES    Plan:  Needs left breast biopsy after needle localization.     Patient Education:Alternative treatments to surgery were discussed with patient (and family).  Risks and benefits  of procedure were fully explained to the patient (and family) who gave informed consent. Patient/family questions were addressed.  Follow-up:Pending Surgery

## 2013-09-04 NOTE — Patient Instructions (Addendum)
Julie Clay  09/04/2013  Your procedure is scheduled on: 09/12/2013  Report to [facility] at  7:30  AM.  Call this number if you have problems the morning of surgery: [phone]   Remember:   Do not eat food or drink liquids after midnight.   Take these medicines the morning of surgery with A SIP OF WATER: Maxzide, Tamoxifen   Do not wear jewelry, make-up or nail polish.  Do not wear lotions, powders, or perfumes.   Do not shave 48 hours prior to surgery. Men may shave face and neck.  Do not bring valuables to the hospital.  [facility] is not responsible for any belongings or valuables.               Contacts, dentures or bridgework may not be worn into surgery.  Leave suitcase in the car. After surgery it may be brought to your room.  For patients admitted to the hospital, discharge time is determined by your treatment team.               Patients discharged the day of surgery will not be allowed to drive home.    Special Instructions: Shower using CHG 1 night before surgery and the morning of surgery.  Use special wash - you have one bottle of CHG for both showers.  You should use approximately 1/2 of the bottle for each shower.   Please read over the following fact sheets that you were given: Pain Booklet, Coughing and Deep Breathing, Surgical Site Infection Prevention, Anesthesia Post-op Instructions and Care and Recovery After Surgery   Breast Biopsy A breast biopsy is a procedure where a sample of breast tissue is removed from your breast. The tissue is examined under a microscope to see if cancerous cells are present. A breast biopsy is done when there is:  Any undiagnosed breast mass (tumor).  Nipple abnormalities, dimpling, crusting, or ulcerations.  Abnormal discharge from the nipple, especially blood.  Redness, swelling, and pain of the breast.  Calcium deposits (calcifications) or abnormalities seen on a mammogram, ultrasound result, or results of  magnetic resonance imaging (MRI).  Suspicious changes in the breast seen on your mammogram. If the tumor is found to be cancerous (malignant), a breast biopsy can help to determine what the best treatment is for you. There are many different types of breast biopsies. Talk to your caregiver about your options and which type is best for you. LET YOUR CAREGIVER KNOW ABOUT:  Allergies to food or medicine.  Medicines taken, including vitamins, herbs, eyedrops, over-the-counter medicines, and creams.  Use of steroids (by mouth or creams).  Previous problems with anesthetics or numbing medicines.  History of bleeding problems or blood clots.  Previous surgery.  Other health problems, including diabetes and kidney problems.  Any recent colds or infections.  Possibility of pregnancy, if this applies. RISKS AND COMPLICATIONS   Bleeding.  Infection.  Allergy to medicines.  Bruising and swelling of the breast.  Alteration in the shape of the breast.  Not finding the lump or abnormality.  Needing more surgery. BEFORE THE PROCEDURE  Arrange for someone to drive you home after the procedure.  Do not smoke for 2 weeks before the procedure. Stop smoking, if you smoke.  Do not drink alcohol for 24 hours before procedure.  Wear a good support bra to the procedure. PROCEDURE  You may be given a medicine to numb the breast area (local anesthesia) or a  medicine to make you sleep (general anesthesia) during the procedure. The following are the different types of biopsies that can be performed.   Fine-needle aspiration A thin needle is attached to a syringe and inserted into the breast lump. Fluid and cells are removed and then looked at under a microscope. If the breast lump cannot be felt, an ultrasound may be used to help locate the lump and place the needle in the correct area.   Core needle biopsy A wide, hollow needle (core needle) is inserted into the breast lump 3 6 times to get  tissue samples or cores. The samples are removed. The needle is usually placed in the correct area by using an ultrasound or X-ray.   Stereotactic biopsy X-ray equipment and a computer are used to analyze X-ray pictures of the breast lump. The computer then finds exactly where the core needle needs to be inserted. Tissue samples are removed.   Vacuum-assisted biopsy A small incision (less than  inch) is made in your breast. A biopsy device that includes a hollow needle and vacuum is passed through the incision and into the breast tissue. The vacuum gently draws abnormal breast tissue into the needle to remove it. This type of biopsy removes a larger tissue sample than a regular core needle biopsy. No stitches are needed, and there is usually little scarring.  Ultrasound-guided core needle biopsy A high frequency ultrasound helps guide the core needle to the area of the mass or abnormality. An incision is made to insert the needle. Tissue samples are removed.  Open biopsy A larger incision is made in the breast. Your caregiver will attempt to remove the whole breast lump or as much as possible. AFTER THE PROCEDURE  You will be taken to the recovery area. If you are doing well and have no problems, you will be allowed to go home.  You may notice bruising on your breast. This is normal.  Your caregiver may apply a pressure dressing on your breast for 24 48 hours. A pressure dressing is a bandage that is wrapped tightly around the chest to stop fluid from collecting underneath tissues. Document Released: 04/05/2005 Document Revised: 07/31/2012 Document Reviewed: 05/06/2011 [facility] Patient Information 2014 [place].  PATIENT INSTRUCTIONS POST-ANESTHESIA  IMMEDIATELY FOLLOWING SURGERY:  Do not drive or operate machinery for the first twenty four hours after surgery.  Do not make any important decisions for twenty four hours after surgery or while taking narcotic pain medications or  sedatives.  If you develop intractable nausea and vomiting or a severe headache please notify your doctor immediately.  FOLLOW-UP:  Please make an appointment with your surgeon as instructed. You do not need to follow up with anesthesia unless specifically instructed to do so.  WOUND CARE INSTRUCTIONS (if applicable):  Keep a dry clean dressing on the anesthesia/puncture wound site if there is drainage.  Once the wound has quit draining you may leave it open to air.  Generally you should leave the bandage intact for twenty four hours unless there is drainage.  If the epidural site drains for more than 36-48 hours please call the anesthesia department.  QUESTIONS?:  Please feel free to call your physician or the hospital operator if you have any questions, and they will be happy to assist you.

## 2013-09-05 ENCOUNTER — Other Ambulatory Visit: Payer: Self-pay

## 2013-09-05 ENCOUNTER — Encounter (HOSPITAL_COMMUNITY)
Admission: RE | Admit: 2013-09-05 | Discharge: 2013-09-05 | Disposition: A | Discharge status: Home / Self Care | Payer: Medicare Other | Source: Ambulatory Visit | Attending: General Surgery | Admitting: General Surgery

## 2013-09-05 ENCOUNTER — Encounter (HOSPITAL_COMMUNITY): Payer: Self-pay

## 2013-09-05 DIAGNOSIS — Z01810 Encounter for preprocedural cardiovascular examination: Secondary | ICD-10-CM | POA: Insufficient documentation

## 2013-09-05 DIAGNOSIS — Z01812 Encounter for preprocedural laboratory examination: Secondary | ICD-10-CM | POA: Diagnosis not present

## 2013-09-05 HISTORY — DX: Sleep apnea, unspecified: G47.30

## 2013-09-05 LAB — CBC WITH DIFFERENTIAL/PLATELET
BASOS ABS: 0 10*3/uL (ref 0.0–0.1)
BASOS PCT: 0 % (ref 0–1)
EOS ABS: 0.3 10*3/uL (ref 0.0–0.7)
Eosinophils Relative: 3 % (ref 0–5)
HCT: 38.4 % (ref 36.0–46.0)
Hemoglobin: 12.5 g/dL (ref 12.0–15.0)
Lymphocytes Relative: 27 % (ref 12–46)
Lymphs Abs: 2.6 10*3/uL (ref 0.7–4.0)
MCH: 26.9 pg (ref 26.0–34.0)
MCHC: 32.6 g/dL (ref 30.0–36.0)
MCV: 82.6 fL (ref 78.0–100.0)
Monocytes Absolute: 0.9 10*3/uL (ref 0.1–1.0)
Monocytes Relative: 9 % (ref 3–12)
NEUTROS ABS: 5.7 10*3/uL (ref 1.7–7.7)
NEUTROS PCT: 61 % (ref 43–77)
Platelets: 259 10*3/uL (ref 150–400)
RBC: 4.65 MIL/uL (ref 3.87–5.11)
RDW: 14.4 % (ref 11.5–15.5)
WBC: 9.5 10*3/uL (ref 4.0–10.5)

## 2013-09-05 LAB — BASIC METABOLIC PANEL
BUN: 22 mg/dL (ref 6–23)
CO2: 28 mEq/L (ref 19–32)
CREATININE: 1.03 mg/dL (ref 0.50–1.10)
Calcium: 9.6 mg/dL (ref 8.4–10.5)
Chloride: 101 mEq/L (ref 96–112)
GFR, EST AFRICAN AMERICAN: 62 mL/min — AB (ref >90)
GFR, EST NON AFRICAN AMERICAN: 53 mL/min — AB (ref >90)
Glucose, Bld: 201 mg/dL — ABNORMAL HIGH (ref 70–99)
Potassium: 4.6 mEq/L (ref 3.7–5.3)
Sodium: 143 mEq/L (ref 137–147)

## 2013-09-05 MED ORDER — CHLORHEXIDINE GLUCONATE 4 % EX LIQD: 1.0000 "application " | Freq: Once | CUTANEOUS | Status: DC

## 2013-09-05 NOTE — Progress Notes (Signed)
09/05/13 0910  OBSTRUCTIVE SLEEP APNEA  Have you ever been diagnosed with sleep apnea through a sleep study? No  Do you snore loudly (loud enough to be heard through closed doors)?  1  Do you often feel tired, fatigued, or sleepy during the daytime? 1  Has anyone observed you stop breathing during your sleep? 0  Do you have, or are you being treated for high blood pressure? 1  BMI more than 35 kg/m2? 1  Age over 71 years old? 1  Neck circumference greater than 40 cm/16 inches? 0  Gender: 0  Obstructive Sleep Apnea Score 5  Score 4 or greater  Results sent to PCP

## 2013-09-06 ENCOUNTER — Other Ambulatory Visit: Payer: Self-pay | Admitting: Family Medicine

## 2013-09-06 NOTE — Telephone Encounter (Signed)
Refill appropriate and filled per protocol. 

## 2013-09-12 ENCOUNTER — Encounter (HOSPITAL_COMMUNITY): Payer: Medicare Other | Admitting: Anesthesiology

## 2013-09-12 ENCOUNTER — Ambulatory Visit (HOSPITAL_COMMUNITY): Payer: Medicare Other

## 2013-09-12 ENCOUNTER — Encounter (HOSPITAL_COMMUNITY)
Admission: RE | Disposition: A | Discharge status: Home / Self Care | Payer: Self-pay | Source: Ambulatory Visit | Attending: General Surgery

## 2013-09-12 ENCOUNTER — Encounter (HOSPITAL_COMMUNITY): Payer: Self-pay | Admitting: Anesthesiology

## 2013-09-12 ENCOUNTER — Ambulatory Visit (HOSPITAL_COMMUNITY)
Admission: RE | Admit: 2013-09-12 | Discharge: 2013-09-12 | Disposition: A | Discharge status: Home / Self Care | Payer: Medicare Other | Source: Ambulatory Visit | Attending: General Surgery | Admitting: General Surgery

## 2013-09-12 ENCOUNTER — Ambulatory Visit (HOSPITAL_COMMUNITY): Payer: Medicare Other | Admitting: Anesthesiology

## 2013-09-12 DIAGNOSIS — Z79899 Other long term (current) drug therapy: Secondary | ICD-10-CM | POA: Diagnosis not present

## 2013-09-12 DIAGNOSIS — E7800 Pure hypercholesterolemia, unspecified: Secondary | ICD-10-CM | POA: Insufficient documentation

## 2013-09-12 DIAGNOSIS — D4860 Neoplasm of uncertain behavior of unspecified breast: Secondary | ICD-10-CM | POA: Diagnosis not present

## 2013-09-12 DIAGNOSIS — E119 Type 2 diabetes mellitus without complications: Secondary | ICD-10-CM | POA: Diagnosis not present

## 2013-09-12 DIAGNOSIS — D493 Neoplasm of unspecified behavior of breast: Secondary | ICD-10-CM | POA: Diagnosis not present

## 2013-09-12 DIAGNOSIS — D249 Benign neoplasm of unspecified breast: Secondary | ICD-10-CM

## 2013-09-12 DIAGNOSIS — R229 Localized swelling, mass and lump, unspecified: Secondary | ICD-10-CM

## 2013-09-12 DIAGNOSIS — J449 Chronic obstructive pulmonary disease, unspecified: Secondary | ICD-10-CM | POA: Insufficient documentation

## 2013-09-12 DIAGNOSIS — N63 Unspecified lump in unspecified breast: Secondary | ICD-10-CM | POA: Diagnosis not present

## 2013-09-12 DIAGNOSIS — Z853 Personal history of malignant neoplasm of breast: Secondary | ICD-10-CM | POA: Insufficient documentation

## 2013-09-12 DIAGNOSIS — IMO0002 Reserved for concepts with insufficient information to code with codable children: Secondary | ICD-10-CM

## 2013-09-12 DIAGNOSIS — J4489 Other specified chronic obstructive pulmonary disease: Secondary | ICD-10-CM | POA: Insufficient documentation

## 2013-09-12 DIAGNOSIS — K449 Diaphragmatic hernia without obstruction or gangrene: Secondary | ICD-10-CM | POA: Diagnosis not present

## 2013-09-12 DIAGNOSIS — G4730 Sleep apnea, unspecified: Secondary | ICD-10-CM | POA: Diagnosis not present

## 2013-09-12 DIAGNOSIS — D0590 Unspecified type of carcinoma in situ of unspecified breast: Secondary | ICD-10-CM | POA: Diagnosis not present

## 2013-09-12 HISTORY — PX: BREAST BIOPSY: SHX20

## 2013-09-12 LAB — GLUCOSE, CAPILLARY
Glucose-Capillary: 137 mg/dL — ABNORMAL HIGH (ref 70–99)
Glucose-Capillary: 137 mg/dL — ABNORMAL HIGH (ref 70–99)

## 2013-09-12 SURGERY — BREAST BIOPSY WITH NEEDLE LOCALIZATION
Anesthesia: General | Site: Breast | Laterality: Left

## 2013-09-12 MED ORDER — MIDAZOLAM HCL 2 MG/2ML IJ SOLN
INTRAMUSCULAR | Status: AC
  Filled 2013-09-12: qty 2

## 2013-09-12 MED ORDER — FENTANYL CITRATE 0.05 MG/ML IJ SOLN
INTRAMUSCULAR | Status: AC
  Filled 2013-09-12: qty 2

## 2013-09-12 MED ORDER — HYDROCODONE-ACETAMINOPHEN 10-325 MG PO TABS: 1.0000 | ORAL_TABLET | Freq: Four times a day (QID) | ORAL | Status: DC | PRN

## 2013-09-12 MED ORDER — LIDOCAINE HCL (PF) 1 % IJ SOLN
INTRAMUSCULAR | Status: AC
  Filled 2013-09-12: qty 5

## 2013-09-12 MED ORDER — GLYCOPYRROLATE 0.2 MG/ML IJ SOLN
0.2000 mg | Freq: Once | INTRAMUSCULAR | Status: AC
  Administered 2013-09-12: 0.2 mg via INTRAVENOUS

## 2013-09-12 MED ORDER — BUPIVACAINE HCL (PF) 0.5 % IJ SOLN
INTRAMUSCULAR | Status: AC
  Filled 2013-09-12: qty 30

## 2013-09-12 MED ORDER — LIDOCAINE HCL (PF) 1 % IJ SOLN
INTRAMUSCULAR | Status: AC
  Filled 2013-09-12: qty 30

## 2013-09-12 MED ORDER — FENTANYL CITRATE 0.05 MG/ML IJ SOLN
INTRAMUSCULAR | Status: AC
  Filled 2013-09-12: qty 5

## 2013-09-12 MED ORDER — LACTATED RINGERS IV SOLN
INTRAVENOUS | Status: DC
  Administered 2013-09-12: 1000 mL via INTRAVENOUS
  Administered 2013-09-12: 10:00:00 via INTRAVENOUS

## 2013-09-12 MED ORDER — ONDANSETRON HCL 4 MG/2ML IJ SOLN: 4.0000 mg | Freq: Once | INTRAMUSCULAR | Status: DC | PRN

## 2013-09-12 MED ORDER — 0.9 % SODIUM CHLORIDE (POUR BTL) OPTIME
TOPICAL | Status: DC | PRN
  Administered 2013-09-12: 1000 mL

## 2013-09-12 MED ORDER — GLYCOPYRROLATE 0.2 MG/ML IJ SOLN
INTRAMUSCULAR | Status: AC
  Filled 2013-09-12: qty 1

## 2013-09-12 MED ORDER — FENTANYL CITRATE 0.05 MG/ML IJ SOLN
25.0000 ug | INTRAMUSCULAR | Status: AC
  Administered 2013-09-12 (×2): 25 ug via INTRAVENOUS

## 2013-09-12 MED ORDER — LIDOCAINE HCL (CARDIAC) 20 MG/ML IV SOLN
INTRAVENOUS | Status: DC | PRN
  Administered 2013-09-12: 40 mg via INTRAVENOUS

## 2013-09-12 MED ORDER — FENTANYL CITRATE 0.05 MG/ML IJ SOLN
25.0000 ug | INTRAMUSCULAR | Status: DC | PRN
  Administered 2013-09-12 (×2): 25 ug via INTRAVENOUS

## 2013-09-12 MED ORDER — PROPOFOL 10 MG/ML IV BOLUS
INTRAVENOUS | Status: DC | PRN
  Administered 2013-09-12: 150 mg via INTRAVENOUS

## 2013-09-12 MED ORDER — KETOROLAC TROMETHAMINE 30 MG/ML IJ SOLN
INTRAMUSCULAR | Status: AC
  Filled 2013-09-12: qty 1

## 2013-09-12 MED ORDER — MIDAZOLAM HCL 2 MG/2ML IJ SOLN
1.0000 mg | INTRAMUSCULAR | Status: DC | PRN
  Administered 2013-09-12: 2 mg via INTRAVENOUS

## 2013-09-12 MED ORDER — ONDANSETRON HCL 4 MG/2ML IJ SOLN
4.0000 mg | Freq: Once | INTRAMUSCULAR | Status: AC
  Administered 2013-09-12: 4 mg via INTRAVENOUS

## 2013-09-12 MED ORDER — FENTANYL CITRATE 0.05 MG/ML IJ SOLN
INTRAMUSCULAR | Status: DC | PRN
  Administered 2013-09-12 (×3): 25 ug via INTRAVENOUS
  Administered 2013-09-12: 50 ug via INTRAVENOUS

## 2013-09-12 MED ORDER — KETOROLAC TROMETHAMINE 30 MG/ML IJ SOLN
30.0000 mg | Freq: Once | INTRAMUSCULAR | Status: AC
  Administered 2013-09-12: 30 mg via INTRAVENOUS

## 2013-09-12 MED ORDER — BUPIVACAINE HCL (PF) 0.5 % IJ SOLN
INTRAMUSCULAR | Status: DC | PRN
  Administered 2013-09-12: 10 mL

## 2013-09-12 MED ORDER — PROPOFOL 10 MG/ML IV BOLUS
INTRAVENOUS | Status: AC
  Filled 2013-09-12: qty 20

## 2013-09-12 MED ORDER — CEFAZOLIN SODIUM-DEXTROSE 2-3 GM-% IV SOLR
2.0000 g | INTRAVENOUS | Status: AC
  Administered 2013-09-12: 2 g via INTRAVENOUS
  Filled 2013-09-12: qty 50

## 2013-09-12 MED ORDER — ONDANSETRON HCL 4 MG/2ML IJ SOLN
INTRAMUSCULAR | Status: AC
  Filled 2013-09-12: qty 2

## 2013-09-12 SURGICAL SUPPLY — 26 items
BAG HAMPER (MISCELLANEOUS) ×3 IMPLANT
BLADE 15 SAFETY STRL DISP (BLADE) ×3 IMPLANT
CLOTH BEACON ORANGE TIMEOUT ST (SAFETY) ×3 IMPLANT
COVER LIGHT HANDLE STERIS (MISCELLANEOUS) ×6 IMPLANT
DERMABOND ADVANCED (GAUZE/BANDAGES/DRESSINGS) ×2
DERMABOND ADVANCED .7 DNX12 (GAUZE/BANDAGES/DRESSINGS) ×1 IMPLANT
DURAPREP 26ML APPLICATOR (WOUND CARE) ×3 IMPLANT
ELECT REM PT RETURN 9FT ADLT (ELECTROSURGICAL) ×3
ELECTRODE REM PT RTRN 9FT ADLT (ELECTROSURGICAL) ×1 IMPLANT
GLOVE BIOGEL PI IND STRL 7.0 (GLOVE) ×1 IMPLANT
GLOVE BIOGEL PI INDICATOR 7.0 (GLOVE) ×2
GLOVE EXAM NITRILE PF MED BLUE (GLOVE) ×3 IMPLANT
GLOVE SS BIOGEL STRL SZ 6.5 (GLOVE) ×1 IMPLANT
GLOVE SUPERSENSE BIOGEL SZ 6.5 (GLOVE) ×2
GLOVE SURG SS PI 7.5 STRL IVOR (GLOVE) ×6 IMPLANT
GOWN STRL REUS W/TWL LRG LVL3 (GOWN DISPOSABLE) ×9 IMPLANT
KIT ROOM TURNOVER APOR (KITS) ×3 IMPLANT
MANIFOLD NEPTUNE II (INSTRUMENTS) ×3 IMPLANT
NS IRRIG 1000ML POUR BTL (IV SOLUTION) ×3 IMPLANT
PACK MINOR (CUSTOM PROCEDURE TRAY) ×3 IMPLANT
PAD ARMBOARD 7.5X6 YLW CONV (MISCELLANEOUS) ×3 IMPLANT
SET BASIN LINEN APH (SET/KITS/TRAYS/PACK) ×3 IMPLANT
SUT SILK 2 0 SH (SUTURE) ×3 IMPLANT
SUT VIC AB 3-0 SH 27 (SUTURE) ×2
SUT VIC AB 3-0 SH 27X BRD (SUTURE) ×1 IMPLANT
SUT VIC AB 4-0 PS2 27 (SUTURE) ×3 IMPLANT

## 2013-09-12 NOTE — Transfer of Care (Signed)
Immediate Anesthesia Transfer of Care Note  Patient: Julie Clay  Procedure(s) Performed: Procedure(s): BREAST BIOPSY WITH NEEDLE LOCALIZATION (Left)  Patient Location: PACU  Anesthesia Type:General  Level of Consciousness: awake  Airway & Oxygen Therapy: Patient Spontanous Breathing and Patient connected to face mask oxygen  Post-op Assessment: Report given to PACU RN  Post vital signs: Reviewed  Complications: No apparent anesthesia complications

## 2013-09-12 NOTE — Anesthesia Procedure Notes (Signed)
Procedure Name: LMA Insertion Date/Time: 09/12/2013 10:17 AM Performed by: Tressie Stalker E Pre-anesthesia Checklist: Patient identified, Patient being monitored, Emergency Drugs available, Timeout performed and Suction available Patient Re-evaluated:Patient Re-evaluated prior to inductionOxygen Delivery Method: Circle System Utilized Preoxygenation: Pre-oxygenation with 100% oxygen Intubation Type: IV induction Ventilation: Mask ventilation without difficulty LMA: LMA inserted LMA Size: 3.0 Number of attempts: 1 Placement Confirmation: positive ETCO2 and breath sounds checked- equal and bilateral

## 2013-09-12 NOTE — Anesthesia Postprocedure Evaluation (Signed)
  Anesthesia Post-op Note  Patient: Julie Clay  Procedure(s) Performed: Procedure(s): BREAST BIOPSY WITH NEEDLE LOCALIZATION (Left)  Patient Location: PACU  Anesthesia Type:General  Level of Consciousness: awake and oriented  Airway and Oxygen Therapy: Patient Spontanous Breathing and Patient connected to face mask oxygen  Post-op Pain: none  Post-op Assessment: Post-op Vital signs reviewed, Patient's Cardiovascular Status Stable, Respiratory Function Stable, Patent Airway and No signs of Nausea or vomiting  Post-op Vital Signs: Reviewed and stable  Last Vitals:  Filed Vitals:   09/12/13 1005  BP:   Temp:   Resp: 19    Complications: No apparent anesthesia complications

## 2013-09-12 NOTE — Interval H&P Note (Signed)
History and Physical Interval Note:  09/12/2013 9:37 AM  [name]  has presented today for surgery, with the diagnosis of left breast neoplasm  The various methods of treatment have been discussed with the patient and family. After consideration of risks, benefits and other options for treatment, the patient has consented to  Procedure(s) with comments: BREAST BIOPSY WITH NEEDLE LOCALIZATION (Left) - needle loc @ 8 am as a surgical intervention .  The patient's history has been reviewed, patient examined, no change in status, stable for surgery.  I have reviewed the patient's chart and labs.  Questions were answered to the patient's satisfaction.     Julie Clay

## 2013-09-12 NOTE — Discharge Instructions (Signed)
Breast Biopsy Care After These instructions give you information on caring for yourself after your procedure. Your doctor may also give you more specific instructions. Call your doctor if you have any problems or questions after your procedure. HOME CARE  Only take medicine as told by your doctor.  Do not take aspirin.  Keep your sutures (stitches) dry when bathing.  Protect the biopsy area. Do not let the area get bumped.  Avoid activities that could pull the biopsy site open until your doctor approves. This includes:  Stretching.  Reaching.  Exercise.  Sports.  Lifting more than 3lb.  Continue your normal diet.  Wear a good support bra for as long as told by your doctor.  Change any bandages (dressings) as told by your doctor.  Do not drink alcohol while taking pain medicine.  Keep all doctor visits as told. Ask when your test results will be ready. Make sure you get your test results. GET HELP RIGHT AWAY IF:   You have a fever.  You have more bleeding (more than a small spot) from the biopsy site.  You have trouble breathing.  You have yellowish-white fluid (pus) coming from the biopsy site.  You have redness, puffiness (swelling), or more pain in the biopsy site.  You have a bad smell coming from the biopsy site.  Your biopsy site opens after sutures, staples, or sticky strips have been removed.  You have a rash.  You need stronger medicine. MAKE SURE YOU:  Understand these instructions.  Will watch your condition.  Will get help right away if you are not doing well or get worse. Document Released: 01/30/2009 Document Revised: 06/28/2011 Document Reviewed: 05/16/2011 [facility] Patient Information 2014 [employer].

## 2013-09-12 NOTE — Op Note (Signed)
Patient:  Julie Clay  DOB:  Jul 01, 1940  MRN:  [id number]   Preop Diagnosis:  Left breast neoplasm  Postop Diagnosis:  Same  Procedure:  Left breast biopsy after needle localization  Surgeon:  Aviva Signs, M.D.  Anes:  General  Indications:  Patient is a 73 year old white female who presents with an abnormal lesion in the left breast. The patient now comes to the operating room for left breast biopsy after needle localization. The risks and benefits of the procedure including bleeding, infection, and malignancy of the left breast were fully explained to the patient, who gave informed consent.  Procedure note:  The patient is placed the supine position. After general anesthesia was administered, the left breast was prepped and draped using usual sterile technique with DuraPrep. Surgical site confirmation was performed.  The patient had already gone needle localization within the radiology department. The needle was located at 3:00 position in the left breast. An incision was made to include the guidewire. The dissection was taken down to the tip of the guidewire where the suspicious area had already been marked. A generous area of breast tissue was taken around this region. The specimen was then sent to radiology for examination. Specimen radiography revealed the suspicious area a clip to be within the specimen removed. It was then sent to pathology for further examination. Any bleeding was controlled using Bovie electrocautery. 0.5% Sensorcaine was instilled the surrounding wound. The skin was closed using a 4 Vicryl subcuticular suture. Dermabond was then applied.  All tape and needle counts were correct at the end of the procedure. Patient was awakened and transferred to PACU in stable condition.  Complications:  None  EBL:  Minimal  Specimen:  Left breast tissue

## 2013-09-12 NOTE — Anesthesia Preprocedure Evaluation (Signed)
Anesthesia Evaluation  Patient identified by MRN, date of birth, ID band Patient awake    Reviewed: Allergy & Precautions, H&P , NPO status , Patient's Chart, lab work & pertinent test results  Airway Mallampati: I TM Distance: >3 FB Neck ROM: Full    Dental  (+) Teeth Intact   Pulmonary shortness of breath and with exertion, asthma , sleep apnea ,  breath sounds clear to auscultation        Cardiovascular hypertension, Pt. on medications Rhythm:Regular Rate:Normal     Neuro/Psych PSYCHIATRIC DISORDERS    GI/Hepatic hiatal hernia,   Endo/Other  diabetes, Well Controlled, Type 2  Renal/GU Renal disease     Musculoskeletal   Abdominal   Peds  Hematology   Anesthesia Other Findings   Reproductive/Obstetrics                           Anesthesia Physical Anesthesia Plan  ASA: III  Anesthesia Plan: General   Post-op Pain Management:    Induction: Intravenous  Airway Management Planned: LMA  Additional Equipment:   Intra-op Plan:   Post-operative Plan: Extubation in OR  Informed Consent: I have reviewed the patients History and Physical, chart, labs and discussed the procedure including the risks, benefits and alternatives for the proposed anesthesia with the patient or authorized representative who has indicated his/her understanding and acceptance.     Plan Discussed with:   Anesthesia Plan Comments:         Anesthesia Quick Evaluation

## 2013-09-13 ENCOUNTER — Encounter (HOSPITAL_COMMUNITY): Payer: Self-pay | Admitting: General Surgery

## 2013-09-17 NOTE — Progress Notes (Signed)
[name], MD [address] 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  INTERVAL HISTORY: Julie Clay 73 y.o. 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 She declined radiation therapy at the time of presentation in December 2012.     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  The results are noted within this dictation.  I personally reviewed and went over radiographic studies with the patient.  The results are noted within this dictation.    I personally reviewed and went over pathology results with the patient.  Aadhira reports that she will be undergoing rectocele repair by Dr. Glo Herring in the future.    She also notes that she is in need of knee arthroplasty intervention.  I discussed the patient's case with Dr. Isidore Moos ([place]) to see if Karlisha is a candidate for radiation therapy given her recurrent hyperplasia biopsies of left breast.  Dr. Isidore Moos does not feel she meets criteria for radiation therapy.  I discussed this with the patient.  Another option is to undergo mastectomy and she declines that intervention.   Oncologically, she denies any complaints and ROS questioning is negative.   Past Medical History  Diagnosis Date  . Hypertension   . Hyperlipidemia   .  Diabetes mellitus   . Asthma     spirometry (2011)- mild ventilary defect  . Cancer 2012    Breast  . Arthritis     Osteoarthritis  . Fatty liver 2013    enlarged  . Sliding hiatal hernia   . Ventral hernia, unspecified, without mention of obstruction or gangrene   . Lung nodule seen on imaging study 2013  . HSV (herpes simplex virus) infection   . Lymphedema     Left arm  . 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 Calcification of left breast   . Arthritis   . Shortness of breath     with exertion  . Sleep apnea     Stop Bang score of 5. Pt has had sleep study, but was shown to be negative for sleep apnea.    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; Essential hypertension, benign; DM (diabetes mellitus); OA (osteoarthritis); Morbidly obese; Asthma; Hyperlipidemia; Chronic fatigue; OSA (obstructive sleep apnea); Dyspnea; HSV (herpes simplex virus) infection; Peripheral edema; Pain in joint, shoulder region; Rotator cuff syndrome of left shoulder; Muscle weakness (generalized); Occupational therapy encounter; Hip pain; Insomnia; Pain, joint, multiple sites; CKD (chronic kidney disease), stage II; Diabetic neuropathy; Pre-ulcerative calluses; Pelvic pain; Bladder prolapse, female, acquired; Endometrial polyp; Rectocele; and Atypical ductal hyperplasia of left breast on her problem list.     is allergic to arimidex; adhesive; lisinopril; and tetracyclines & related.  Ms. Brodrick does not currently have medications on file.  Past Surgical History  Procedure  Laterality Date  . Cesarean section    . 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 Breast surgery  jan 2013  . Hand ligament reconstruction Right   . Cataract extraction, bilateral      on different occassions   . Shoulder arthroscopy with open rotator cuff repair Left 07/26/2012  . Breast biopsy  06/22/13  . Breast biopsy Left 09/12/2013    Procedure: BREAST BIOPSY WITH NEEDLE  LOCALIZATION;  Surgeon: Jamesetta So, MD;  Location: [facility];  Service: General;  Laterality: Left;    Denies any headaches, dizziness, double vision, fevers, chills, night sweats, nausea, vomiting, diarrhea, constipation, chest pain, heart palpitations, shortness of breath, blood in stool, black tarry stool, urinary pain, urinary burning, urinary frequency, hematuria.   PHYSICAL EXAMINATION  ECOG PERFORMANCE STATUS: 0 - Asymptomatic  Filed Vitals:   09/19/13 1400  BP: 141/83  Pulse: 86  Temp: 97 F (36.1 C)  Resp: 20    GENERAL:alert, no distress, well nourished, well developed, comfortable, cooperative, obese and smiling SKIN: skin color, texture, turgor are normal, no rashes or significant lesions HEAD: Normocephalic, No masses, lesions, tenderness or abnormalities EYES: normal, PERRLA, EOMI, Conjunctiva are pink and non-injected EARS: External ears normal OROPHARYNX:lips, buccal mucosa, and tongue normal and mucous membranes are moist  NECK: supple, no adenopathy, trachea midline LYMPH:  not examined BREAST:patient declines to have breast exam LUNGS: clear to auscultation  HEART: regular rate & rhythm ABDOMEN:abdomen soft, obese and normal bowel sounds BACK: Back symmetric, no curvature. EXTREMITIES:less then 2 second capillary refill, no joint deformities, effusion, or inflammation, no cyanosis  NEURO: alert & oriented x 3 with fluent speech, no focal motor/sensory deficits, gait normal    LABORATORY DATA: CBC    Component Value Date/Time   WBC 9.5 09/05/2013 0915   RBC 4.65 09/05/2013 0915   HGB 12.5 09/05/2013 0915   HCT 38.4 09/05/2013 0915   PLT 259 09/05/2013 0915   MCV 82.6 09/05/2013 0915   MCH 26.9 09/05/2013 0915   MCHC 32.6 09/05/2013 0915   RDW 14.4 09/05/2013 0915   LYMPHSABS 2.6 09/05/2013 0915   MONOABS 0.9 09/05/2013 0915   EOSABS 0.3 09/05/2013 0915   BASOSABS 0.0 09/05/2013 0915      Chemistry      Component Value Date/Time   NA 143 09/05/2013 0915     K 4.6 09/05/2013 0915   CL 101 09/05/2013 0915   CO2 28 09/05/2013 0915   BUN 22 09/05/2013 0915   CREATININE 1.03 09/05/2013 0915   CREATININE 1.09 07/13/2013 0918      Component Value Date/Time   CALCIUM 9.6 09/05/2013 0915   ALKPHOS 52 06/13/2013 1120   AST 22 06/13/2013 1120   ALT 25 06/13/2013 1120   BILITOT 0.3 06/13/2013 1120        PATHOLOGY:  09/12/2013  Diagnosis Breast, lumpectomy, left - LOBULAR NEOPLASIA (ATYPICAL LOBULAR HYPERPLASIA). - BIOPSY SITE REACTION. - CALCIFICATIONS IN BENIGN TISSUE. - FIBROCYSTIC CHANGE. - NO ATYPICAL DUCTAL HYPERPLASIA OR CARCINOMA IDENTIFIED, SEE COMMENT. Diagnosis Note The prior biopsy ([id number]) was reviewed. There is no evidence of atypical ductal hyperplasia in the current sample. The atypical foci were likely removed during the previous biopsy. The surgical resection margin(s) of the specimen were inked and microscopically evaluated. Vicente Males MD Pathologist, Electronic Signature (Case signed 09/14/2013)    ASSESSMENT:  1. Left breast lobular neoplasia, S/P lumpectomy by Dr. Arnoldo Morale on 09/12/2013 2. Left-sided stage I cancer the breast infiltrating ductal  type ER positive, PR positive, HER-2/neu nonamplified with an Oncotype DX score 0 presently on Arimidex which she will take for 5 years beginning in Feb or March 2013. She declined radiation therapy at the time of presentation in December 2012.  3. Left full-thickness supraspinatus tear with mild partial-thickness superior infraspinatus tear requiring arthroscopic intervention in April 2014  4. Multiple left breast biopsies (March 2015 and May 2015).    Patient Active Problem List   Diagnosis Date Noted  . Atypical ductal hyperplasia of left breast 07/12/2013  . Endometrial polyp 06/25/2013  . Rectocele 06/25/2013  . Diabetic neuropathy 05/09/2013  . Pre-ulcerative calluses 05/09/2013  . Pelvic pain 05/09/2013  . Bladder prolapse, female, acquired 05/09/2013  . Pain,  joint, multiple sites 03/26/2013  . 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 Insomnia 11/29/2012  . Hip pain 09/08/2012  . Occupational therapy encounter 08/30/2012  . Muscle weakness (generalized) 06/26/2012  . Rotator cuff syndrome of left shoulder 06/21/2012  . Pain in joint, shoulder region 06/16/2012  . Peripheral edema 02/04/2012  . HSV (herpes simplex virus) infection 12/09/2011  . Dyspnea 11/15/2011  . 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 Essential hypertension, benign 10/11/2011  . DM (diabetes mellitus) 10/11/2011  . OA (osteoarthritis) 10/11/2011  . Morbidly obese 10/11/2011  . Asthma 10/11/2011  . Hyperlipidemia 10/11/2011  . Chronic fatigue 10/11/2011  . OSA (obstructive sleep apnea) 10/11/2011      PLAN:  1. I personally reviewed and went over laboratory results with the patient.  The results are noted within this dictation. 2. I personally reviewed and went over radiographic studies with the patient.  The results are noted within this dictation.   3. I personally reviewed and went over pathology results with the patient. 4. I have discussed the patient's case with Dr. Isidore Moos to see if she would be a candidate for radiation, and after discussion, Dr. Isidore Moos does not feel as though radiation is indicated. 5. Continue Tamoxifen daily 6. Discussion regarding mastectomy, but patient declines this intervention. 7. Return in 6 months for follow-up.  Labs ordered by PCP.   THERAPY PLAN:  She will continue with Tamoxifen daily and we will continue to see her every 6 months for the time being. NCCN guidelines recommends the following surveillance for invasive breast cancer:  A. History and Physical exam every 4-6 months for 5 years and then every 12 months.  B. Mammography every 12 months  C. Women on Tamoxifen: annual gynecologic assessment every 12 months if uterus is present.  D. Women on aromatase inhibitor or who experience  ovarian failure secondary to treatment should have monitoring of bone health with a bone mineral density determination at baseline and periodically thereafter.  E. Assess and encourage adherence to adjuvant endocrine therapy.  F. Evidence suggests that active lifestyle and achieving and maintaining an ideal body weight (20-25 BMI) may lead to optimal breast cancer outcomes.  All questions were answered. The patient knows to call the clinic with any problems, questions or concerns. We can certainly see the patient much sooner if necessary.  Patient and plan discussed with Dr. Farrel Gobble and he is in agreement with the aforementioned.   [facility] 09/19/2013

## 2013-09-19 ENCOUNTER — Encounter (HOSPITAL_COMMUNITY): Payer: Self-pay | Admitting: Oncology

## 2013-09-19 ENCOUNTER — Encounter (HOSPITAL_COMMUNITY): Payer: Medicare Other | Attending: Oncology | Admitting: Oncology

## 2013-09-19 VITALS — BP 141/83 | HR 86 | Temp 97.0°F | Resp 20 | Wt 246.0 lb

## 2013-09-19 DIAGNOSIS — C50912 Malignant neoplasm of unspecified site of left female breast: Secondary | ICD-10-CM

## 2013-09-19 DIAGNOSIS — C50919 Malignant neoplasm of unspecified site of unspecified female breast: Secondary | ICD-10-CM

## 2013-09-19 NOTE — Patient Instructions (Signed)
[facility] Discharge Instructions  RECOMMENDATIONS MADE BY THE CONSULTANT AND ANY TEST RESULTS WILL BE SENT TO YOUR REFERRING PHYSICIAN.  We will see you in 6 months. Please call for any questions or concerns.   Thank you for choosing [facility] to provide your oncology and hematology care.  To afford each patient quality time with our providers, please arrive at least 15 minutes before your scheduled appointment time.  With your help, our goal is to use those 15 minutes to complete the necessary work-up to ensure our physicians have the information they need to help with your evaluation and healthcare recommendations.    Effective January 1st, 2014, we ask that you re-schedule your appointment with our physicians should you arrive 10 or more minutes late for your appointment.  We strive to give you quality time with our providers, and arriving late affects you and other patients whose appointments are after yours.    Again, thank you for choosing [facility].  Our hope is that these requests will decrease the amount of time that you wait before being seen by our physicians.       _____________________________________________________________  Should you have questions after your visit to [facility], please contact our office at [phone] between the hours of 8:30 a.m. and 5:00 p.m.  Voicemails left after 4:30 p.m. will not be returned until the following business day.  For prescription refill requests, have your pharmacy contact our office with your prescription refill request.

## 2013-10-10 ENCOUNTER — Other Ambulatory Visit: Payer: Self-pay | Admitting: Family Medicine

## 2013-10-10 ENCOUNTER — Ambulatory Visit (INDEPENDENT_AMBULATORY_CARE_PROVIDER_SITE_OTHER): Payer: Medicare Other | Admitting: Obstetrics and Gynecology

## 2013-10-10 ENCOUNTER — Other Ambulatory Visit: Payer: Self-pay | Admitting: Obstetrics and Gynecology

## 2013-10-10 ENCOUNTER — Encounter: Payer: Self-pay | Admitting: Obstetrics and Gynecology

## 2013-10-10 VITALS — BP 120/76 | Ht 65.0 in | Wt 250.0 lb

## 2013-10-10 DIAGNOSIS — N816 Rectocele: Secondary | ICD-10-CM | POA: Diagnosis not present

## 2013-10-10 DIAGNOSIS — D261 Other benign neoplasm of corpus uteri: Secondary | ICD-10-CM | POA: Diagnosis not present

## 2013-10-10 DIAGNOSIS — Z01818 Encounter for other preprocedural examination: Secondary | ICD-10-CM | POA: Diagnosis not present

## 2013-10-10 DIAGNOSIS — E785 Hyperlipidemia, unspecified: Secondary | ICD-10-CM

## 2013-10-10 DIAGNOSIS — N840 Polyp of corpus uteri: Secondary | ICD-10-CM

## 2013-10-10 DIAGNOSIS — I10 Essential (primary) hypertension: Secondary | ICD-10-CM

## 2013-10-10 DIAGNOSIS — IMO0001 Reserved for inherently not codable concepts without codable children: Secondary | ICD-10-CM

## 2013-10-10 DIAGNOSIS — Z79899 Other long term (current) drug therapy: Secondary | ICD-10-CM

## 2013-10-10 DIAGNOSIS — Z0000 Encounter for general adult medical examination without abnormal findings: Secondary | ICD-10-CM

## 2013-10-10 DIAGNOSIS — E1165 Type 2 diabetes mellitus with hyperglycemia: Secondary | ICD-10-CM

## 2013-10-10 DIAGNOSIS — E119 Type 2 diabetes mellitus without complications: Secondary | ICD-10-CM | POA: Diagnosis not present

## 2013-10-10 NOTE — Progress Notes (Signed)
Patient ID: Julie Clay, female   DOB: 19-Feb-1941, 73 y.o.   MRN: [id number]  Julie Clay is a 73 y.o. female for ENDO biopsy today. UPT not done since patient is postmenopausal. Pt denies any problems at this time.   Endometrial Biopsy: Patient given informed consent, signed copy in the chart, time out was performed. Time out taken. The patient was placed in the lithotomy position and the cervix brought into view with sterile speculum.  Portio of cervix cleansed x 2 with betadine swabs.  A tenaculum was placed in the anterior lip of the cervix. The uterus was sounded for depth of 7 cm,. Milex uterine Explora 3 mm was introduced to into the uterus, suction created,  and an endometrial sample was obtained. All equipment was removed and accounted for.   The patient tolerated the procedure well.    Patient given post procedure instructions.   Plan: Hysteroscopy with removal of polyp and posterior repair in mid July  Followup: 4 weeks for post-op check

## 2013-10-10 NOTE — Patient Instructions (Signed)
Endometrial Biopsy, Care After Refer to this sheet in the next few weeks. These instructions provide you with information on caring for yourself after your procedure. Your health care Travares Nelles may also give you more specific instructions. Your treatment has been planned according to current medical practices, but problems sometimes occur. Call your health care Montavius Subramaniam if you have any problems or questions after your procedure. WHAT TO EXPECT AFTER THE PROCEDURE After your procedure, it is typical to have the following:  You may have mild cramping and a small amount of vaginal bleeding for a few days after the procedure. This is normal. HOME CARE INSTRUCTIONS  Only take over-the-counter or prescription medicine as directed by your health care Avantae Bither.  Do not douche, use tampons, or have sexual intercourse until your health care Tyqwan Pink approves.  Follow your health care Trenesha Alcaide's instructions regarding any activity restrictions, such as strenuous exercise or heavy lifting. SEEK MEDICAL CARE IF:  You have heavy bleeding or bleeding longer than 2 days after the procedure.  You have bad smelling drainage from your vagina.  You have a fever and chills.  Youhave severe lower stomach (abdominal) pain. SEEK IMMEDIATE MEDICAL CARE IF:  You have severe cramps in your stomach or back.  You pass large blood clots.  Your bleeding increases.  You become weak or lightheaded, or you pass out. Document Released: 01/24/2013 Document Reviewed: 01/24/2013 [employer] Patient Information 2015 [employer]. This information is not intended to replace advice given to you by your health care Stetson Pelaez. Make sure you discuss any questions you have with your health care Malessa Zartman.  

## 2013-10-11 LAB — HEMOGLOBIN A1C
HEMOGLOBIN A1C: 7.1 % — AB (ref <5.7)
Mean Plasma Glucose: 157 mg/dL — ABNORMAL HIGH (ref <117)

## 2013-10-12 ENCOUNTER — Encounter: Payer: Self-pay | Admitting: Family Medicine

## 2013-10-12 ENCOUNTER — Ambulatory Visit (INDEPENDENT_AMBULATORY_CARE_PROVIDER_SITE_OTHER): Payer: Medicare Other | Admitting: Family Medicine

## 2013-10-12 VITALS — BP 128/72 | HR 72 | Temp 97.9°F | Resp 16 | Ht 65.0 in | Wt 251.0 lb

## 2013-10-12 DIAGNOSIS — E1149 Type 2 diabetes mellitus with other diabetic neurological complication: Secondary | ICD-10-CM

## 2013-10-12 DIAGNOSIS — E1141 Type 2 diabetes mellitus with diabetic mononeuropathy: Secondary | ICD-10-CM

## 2013-10-12 DIAGNOSIS — I10 Essential (primary) hypertension: Secondary | ICD-10-CM | POA: Diagnosis not present

## 2013-10-12 DIAGNOSIS — G589 Mononeuropathy, unspecified: Secondary | ICD-10-CM

## 2013-10-12 DIAGNOSIS — R002 Palpitations: Secondary | ICD-10-CM | POA: Insufficient documentation

## 2013-10-12 LAB — LIPID PANEL
CHOL/HDL RATIO: 3.6 ratio
Cholesterol: 177 mg/dL (ref 0–200)
HDL: 49 mg/dL (ref >39)
LDL CALC: 67 mg/dL (ref 0–99)
Triglycerides: 307 mg/dL — ABNORMAL HIGH (ref <150)
VLDL: 61 mg/dL — AB (ref 0–40)

## 2013-10-12 NOTE — Assessment & Plan Note (Signed)
Her A1c is 7.1 which is still at goal for her will continue current medications He has intolerance to ACE inhibitors

## 2013-10-12 NOTE — Assessment & Plan Note (Signed)
She's had a few palpitations seems like these may be some PVCs she's not truly symptomatic is aware that they're occurring. There is no specific pattern to them. She did have a recent EKG which I reviewed which was normal. I placed a Holter monitor on her for 24 hours and we'll see can catch anything we may need to have her seen by cardiology Distress from her cancer treatments and everything else may also be causing some of the palpitations.

## 2013-10-12 NOTE — Patient Instructions (Signed)
Continue current medicatons Heart monitor to be done F/U 3 months

## 2013-10-12 NOTE — Assessment & Plan Note (Signed)
Blood pressure is well-controlled no change in medication 

## 2013-10-12 NOTE — Progress Notes (Signed)
Patient ID: Julie Clay, female   DOB: [birth year], 73 y.o.   MRN: [id number]   Subjective:    Patient ID: Julie Clay, female    DOB: [birth year], 73 y.o.   MRN: [id number]  Patient presents for 3 month F/U and Extra heart beat  patient here to follow chronic medical problems. She still under therapy secondary to recurrent breast cancer. She's also been evaluated by GYN for rectocele surgery and status post endometrial biopsy secondary to dysfunctional uterine bleeding.  Diabetes mellitus her A1c returned at 7.1% her fasting blood sugars have been less than 140 she's not had any hypoglycemia no polyuria polydipsia. Her lipid panel was also done.  Palpitations occasional she's had some palpitations over the past few weeks. She's not quite sure when they first started but she can happen when she is sitting or lying down there is no actual pain associated no shortness of breath nausea vomiting or dizziness however she feels the extra beats and was concerned.      Review Of Systems:  GEN- denies fatigue, fever, weight loss,weakness, recent illness HEENT- denies eye drainage, change in vision, nasal discharge, CVS- denies chest pain, +palpitations RESP- denies SOB, cough, wheeze ABD- denies N/V, change in stools, abd pain GU- denies dysuria, hematuria, dribbling, incontinence MSK- denies joint pain, muscle aches, injury Neuro- denies headache, dizziness, syncope, seizure activity       Objective:    BP 128/72  Pulse 72  Temp(Src) 97.9 F (36.6 C) (Oral)  Resp 16  Ht 5\' 5"  (1.651 m)  Wt 251 lb (113.853 kg)  BMI 41.77 kg/m2 GEN- NAD, alert and oriented x3 HEENT- PERRL, EOMI, non injected sclera, pink conjunctiva, MMM, oropharynx clear Neck- Supple, no thyromegaly CVS- RRR, Soft 1/6 SEM right sternal base EXT- pedal edema Pulses- Radial, DP- 2+        Assessment & Plan:      Problem List Items Addressed This Visit   None      Note: This dictation was prepared with  Dragon dictation along with smaller phrase technology. Any transcriptional errors that result from this process are unintentional.

## 2013-10-23 ENCOUNTER — Other Ambulatory Visit: Payer: Self-pay | Admitting: Family Medicine

## 2013-10-23 DIAGNOSIS — I493 Ventricular premature depolarization: Secondary | ICD-10-CM

## 2013-10-23 DIAGNOSIS — R002 Palpitations: Secondary | ICD-10-CM

## 2013-10-23 DIAGNOSIS — R9431 Abnormal electrocardiogram [ECG] [EKG]: Secondary | ICD-10-CM

## 2013-10-23 NOTE — Progress Notes (Signed)
Call pt her heart monitor showed some PVC extra beats they may be causing some palpitations, her EKG while wearing monitor also showed some t wave changes, we can sometimes see this with heart disease. I want her to go ahead and see the cardiologist, Referral placed

## 2013-10-24 NOTE — Progress Notes (Signed)
Call placed to patient and patient made aware.  

## 2013-10-25 ENCOUNTER — Encounter (INDEPENDENT_AMBULATORY_CARE_PROVIDER_SITE_OTHER): Payer: Medicare Other | Admitting: Ophthalmology

## 2013-10-25 DIAGNOSIS — E1139 Type 2 diabetes mellitus with other diabetic ophthalmic complication: Secondary | ICD-10-CM | POA: Diagnosis not present

## 2013-10-25 DIAGNOSIS — H4310 Vitreous hemorrhage, unspecified eye: Secondary | ICD-10-CM | POA: Diagnosis not present

## 2013-10-25 DIAGNOSIS — H43819 Vitreous degeneration, unspecified eye: Secondary | ICD-10-CM

## 2013-10-25 DIAGNOSIS — E11319 Type 2 diabetes mellitus with unspecified diabetic retinopathy without macular edema: Secondary | ICD-10-CM

## 2013-10-25 DIAGNOSIS — E1165 Type 2 diabetes mellitus with hyperglycemia: Secondary | ICD-10-CM

## 2013-10-30 ENCOUNTER — Other Ambulatory Visit: Payer: Self-pay | Admitting: Obstetrics and Gynecology

## 2013-10-30 ENCOUNTER — Telehealth: Payer: Self-pay | Admitting: Obstetrics and Gynecology

## 2013-10-30 NOTE — Patient Instructions (Addendum)
Julie Clay  10/30/2013   Your procedure is scheduled on:  11/06/2013  Report to [facility] at 0800 AM.  Call this number if you have problems the morning of surgery: [phone]   Remember:   Do not eat food or drink liquids after midnight.   Take these medicines the morning of surgery with A SIP OF WATER: Albuterol, Dulera, Maxide   Do not wear jewelry, make-up or nail polish.  Do not wear lotions, powders, or perfumes. You may wear deodorant.  Do not shave 48 hours prior to surgery. Men may shave face and neck.  Do not bring valuables to the hospital.  [facility] is not responsible for any belongings or valuables.               Contacts, dentures or bridgework may not be worn into surgery.  Leave suitcase in the car. After surgery it may be brought to your room.  For patients admitted to the hospital, discharge time is determined by your treatment team.               Patients discharged the day of surgery will not be allowed to drive home.  Name and phone number of your driver:   Special Instructions: Shower using CHG 2 nights before surgery and the night before surgery.  If you shower the day of surgery use CHG.  Use special wash - you have one bottle of CHG for all showers.  You should use approximately 1/3 of the bottle for each shower.   Please read over the following fact sheets that you were given: Pain Booklet, Coughing and Deep Breathing, Surgical Site Infection Prevention and Care and Recovery After Surgery Magnesium Citrate oral solution What is this medicine? MAGNESIUM CITRATE (mag NEE zee um SI treyt) is a saline laxative. It is used to treat occasional constipation, but it should not be used regularly for this purpose. This medicine may be used for other purposes; ask your health care provider or pharmacist if you have questions. COMMON BRAND NAME(S): Citroma What should I tell my health care provider before I take this medicine? They need to know if you have any  of these conditions: -are on a low magnesium or low sodium diet -change in bowel habits for 2 weeks -colostomy or ileostomy -constipation after using another laxative for 7 days -diabetes -kidney disease  Hysteroscopy Hysteroscopy is a procedure used for looking inside the womb (uterus). It may be done for various reasons, including:  To evaluate abnormal bleeding, fibroid (benign, noncancerous) tumors, polyps, scar tissue (adhesions), and possibly cancer of the uterus.  To look for lumps (tumors) and other uterine growths.  To look for causes of why a woman cannot get pregnant (infertility), causes of recurrent loss of pregnancy (miscarriages), or a lost intrauterine device (IUD).  To perform a sterilization by blocking the fallopian tubes from inside the uterus. In this procedure, a thin, flexible tube with a tiny light and camera on the end of it (hysteroscope) is used to look inside the uterus. A hysteroscopy should be done right after a menstrual period to be sure you are not pregnant. LET [facility] CARE PROVIDER KNOW ABOUT:   Any allergies you have.  All medicines you are taking, including vitamins, herbs, eye drops, creams, and over-the-counter medicines.  Previous problems you or members of your family have had with the use of anesthetics.  Any blood disorders you have.  Previous surgeries you have had.  Medical conditions you  have. RISKS AND COMPLICATIONS  Generally, this is a safe procedure. However, as with any procedure, complications can occur. Possible complications include:  Putting a hole in the uterus.  Excessive bleeding.  Infection.  Damage to the cervix.  Injury to other organs.  Allergic reaction to medicines.  Too much fluid used in the uterus for the procedure. BEFORE THE PROCEDURE   Ask your health care provider about changing or stopping any regular medicines.  Do not take aspirin or blood thinners for 1 week before the procedure, or as  directed by your health care provider. These can cause bleeding.  If you smoke, do not smoke for 2 weeks before the procedure.  In some cases, a medicine is placed in the cervix the day before the procedure. This medicine makes the cervix have a larger opening (dilate). This makes it easier for the instrument to be inserted into the uterus during the procedure.  Do not eat or drink anything for at least 8 hours before the surgery.  Arrange for someone to take you home after the procedure. PROCEDURE   You may be given a medicine to relax you (sedative). You may also be given one of the following:  A medicine that numbs the area around the cervix (local anesthetic).  A medicine that makes you sleep through the procedure (general anesthetic).  The hysteroscope is inserted through the vagina into the uterus. The camera on the hysteroscope sends a picture to a TV screen. This gives the surgeon a good view inside the uterus.  During the procedure, air or a liquid is put into the uterus, which allows the surgeon to see better.  Sometimes, tissue is gently scraped from inside the uterus. These tissue samples are sent to a lab for testing. AFTER THE PROCEDURE   If you had a general anesthetic, you may be groggy for a couple hours after the procedure.  If you had a local anesthetic, you will be able to go home as soon as you are stable and feel ready.  You may have some cramping. This normally lasts for a couple days.  You may have bleeding, which varies from light spotting for a few days to menstrual-like bleeding for 3-7 days. This is normal.  If your test results are not back during the visit, make an appointment with your health care provider to find out the results. Document Released: 07/12/2000 Document Revised: 01/24/2013 Document Reviewed: 11/02/2012 [facility] Patient Information 2015 [place], [place]. This information is not intended to replace advice given to you by your health care  provider. Make sure you discuss any questions you have with your health care provider.  -rectal bleeding -stomach pain, nausea, or vomiting -an unusual or allergic reaction to magnesium citrate, other magnesium products, other medicines, foods, dyes, or preservatives -pregnant or trying to get pregnant -breast-feeding How should I use this medicine? Take this medicine by mouth. Follow the directions on the package or prescription label. Use a specially marked spoon or container to measure each dose. Ask your pharmacist if you do not have one. Household spoons are not accurate. Drink a full glass of fluid with each dose of this medicine. This medicine may taste better if it is chilled before you drink it. Do not take your medicine more often than directed. Talk to your pediatrician regarding the use of this medicine in children. While this drug may be prescribed for children as young as 20 years of age for selected conditions, precautions do apply. Overdosage: If  you think you have taken too much of this medicine contact a poison control center or emergency room at once. NOTE: This medicine is only for you. Do not share this medicine with others. What if I miss a dose? This does not apply; this medicine is not for regular use. What may interact with this medicine? -cellulose sodium phosphate -digoxin -edetate disodium, EDTA -medicines for bone strength like etidronate, ibandronate, risedronate -sodium polystyrene sulfonate -some antibiotics like ciprofloxacin, doxycycline, gatifloxacin, levofloxacin, tetracycline -vitamin D This list may not describe all possible interactions. Give your health care provider a list of all the medicines, herbs, non-prescription drugs, or dietary supplements you use. Also tell them if you smoke, drink alcohol, or use illegal drugs. Some items may interact with your medicine. What should I watch for while using this medicine? Tell your doctor or healthcare  professional if your symptoms do not start to get better or if they get worse. Do not take any other medicine by mouth within 2 hours of taking this medicine. What side effects may I notice from receiving this medicine? Side effects that you should report to your doctor or health care professional as soon as possible: -allergic reactions like skin rash, itching or hives, swelling of the face, lips, or tongue -breathing problems -chest pain -fast, irregular heartbeat -muscle weakness -nausea or vomiting Side effects that usually do not require medical attention (report to your doctor or health care professional if they continue or are bothersome): -diarrhea -stomach upset This list may not describe all possible side effects. Call your doctor for medical advice about side effects. You may report side effects to FDA at 1-800-FDA-1088. Where should I keep my medicine? Keep out of the reach of children. Store at room temperature or in the refrigerator between 8 and 30 degrees C (46 and 86 degrees F). Throw away any unused medicine 24 hours after opening the bottle. Throw away unopened bottles of medicine after the expiration date. NOTE: This sheet is a summary. It may not cover all possible information. If you have questions about this medicine, talk to your doctor, pharmacist, or health care provider.  2015, Elsevier/Gold Standard. (2007-10-24 17:27:50)  Anterior and Posterior Colporrhaphy, Care After Refer to this sheet in the next few weeks. These instructions provide you with information on caring for yourself after your procedure. Your health care provider may also give you more specific instructions. Your treatment has been planned according to current medical practices, but problems sometimes occur. Call your health care provider if you have any problems or questions after your procedure. HOME CARE INSTRUCTIONS   Take frequent rest periods throughout the day.   Only take  over-the-counter or prescription medicines as directed by your health care provider.   Avoid strenuous activity such as heavy lifting (more than 10 pounds [4.5 kg]), pushing, and pulling until your health care provider says it is okay.   Take showers if your health care provider approves. Pat incisions dry. Do not rub incisions with a washcloth or towel. Do not take tub baths until your health care provider approves.   Wear compression stockings as directed by your health care provider. These stockings help prevent blood clots from forming in your legs.   Talk with your health care provider about when you may return to work and your exercise routine.   Do not drive until your health care provider approves.   You may resume your normal diet. Eat a well-balanced diet.   Drink enough fluids to keep  your urine clear or pale yellow.   Your normal bowel function should return. If you become constipated, you may:   Take a mild laxative.   Add fruit and bran to your diet.   Drink more fluids.  Do not have sexual intercourse until permitted by your health care provider.  Follow up with your health care provider as directed. SEEK MEDICAL CARE IF: You have persistent nausea or vomiting.  SEEK IMMEDIATE MEDICAL CARE IF:   You have increased bleeding (more than a small spot) from the vaginal area.   Your pain is not relieved with medicine or becomes worse.   You have redness, swelling, or increasing pain in the vaginal area.   You have abdominal pain.   You see pus coming from the wounds.   You develop a fever.   You have a foul smell coming from your vaginal area.   You develop light-headedness or you feel faint.   You have difficulty breathing.  MAKE SURE YOU:  Understand these instructions.  Will watch your condition.  Will get help right away if you are not doing well or get worse. Document Released: 10/22/2004 Document Revised: 12/06/2012 Document  Reviewed: 08/25/2012 [facility] Patient Information 2015 [place], [place]. This information is not intended to replace advice given to you by your health care provider. Make sure you discuss any questions you have with your health care provider.

## 2013-10-30 NOTE — H&P (Signed)
CC & HPI:   Julie Clay is a 73 y.o. female presenting today for discussion of timing of endometrial polypectomy and rectocele repair. Pt has breast surgery and knee surgery that she considers more important and I support that decision. Postpone 6 months and reassess.  ROS:   Negative except listed above  Pertinent History Reviewed:   Medical & Surgical Hx: Reviewed: Significant for  Past Medical History   Diagnosis  Date   .  Hypertension    .  Hyperlipidemia    .  Diabetes mellitus    .  Asthma      spirometry (2011)- mild ventilary defect   .  Cancer  2012     Breast   .  Arthritis      Osteoarthritis   .  Fatty liver  2013     enlarged   .  Sliding hiatal hernia    .  Ventral hernia, unspecified, without mention of obstruction or gangrene    .  Lung nodule seen on imaging study  2013   .  HSV (herpes simplex virus) infection    .  Lymphedema      Left arm   .  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  Calcification of left breast    .  Arthritis     Past Surgical History   Procedure  Laterality  Date   .  Cesarean section     .  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  Breast surgery   jan 2013   .  Hand ligament reconstruction     .  Cataract extraction, bilateral       on different occassions   .  Shoulder arthroscopy with open rotator cuff repair  Left  07/26/2012   .  Breast biopsy   06/22/13   Medications: Reviewed & Updated - see associated section  Social History: Reviewed - reports that she has been passively smoking. She has never used smokeless tobacco.  Objective Findings:   Vitals: BP 138/62  Ht 5\' 5"  (1.651 m)  Wt 246 lb 9.6 oz (111.857 kg)  BMI 41.04 kg/m2  Physical Examination: General appearance - alert, well appearing, and in no distress and oriented to person, place, and time  Physical Examination: General appearance - alert, well appearing, and in no distress and overweight Mental status - alert, oriented to  person, place, and time Eyes - pupils equal and reactive, extraocular eye movements intact Neck - supple, no significant adenopathy Heart - S1 and S2 normal Abdomen - soft, nontender, nondistended, no masses or organomegaly Pelvic - normal external genitalia, vulva, vagina, cervix, uterus and adnexa Extremities - peripheral pulses normal, no pedal edema, no clubbing or cyanosis  Pelvic - Physical Examination: Pelvic - VULVA: normal appearing vulva with no masses, tenderness or lesions, VAGINA: normal appearing vagina with normal color and discharge, no lesions, atrophic, PELVIC FLOOR EXAM: rectocele to 90, CERVIX: normal appearing cervix without discharge or lesions, pelvic laxity, UTERUS: uterus is normal size, shape, consistency and nontender, mobile, ADNEXA: normal adnexa in size, nontender and no masses\  Endometrial Biopsy:  Patient given informed consent, signed copy in the chart, time out was performed. Time out taken. The patient was placed in the lithotomy position and the cervix brought into view with sterile speculum. Portio of cervix cleansed x 2 with betadine swabs. A tenaculum was placed in the anterior lip of the  cervix.  The uterus was sounded for depth of 7 cm,.  Milex uterine Explora 3 mm was introduced to into the uterus, suction created, and an endometrial sample was obtained. All equipment was removed and accounted for.  The patient tolerated the procedure well.  Patient given post procedure instructions.  Plan: Hysteroscopy with removal of polyp and posterior repair in mid July     Assessment & Plan:   A:  1. Endometrial polyp  2. Rectocele  This is scheduled for hysteroscopy D&C removal of endometrial polyp and posterior repair on 11/07/18 15

## 2013-10-31 ENCOUNTER — Encounter (HOSPITAL_COMMUNITY)
Admission: RE | Admit: 2013-10-31 | Discharge: 2013-10-31 | Disposition: A | Discharge status: Home / Self Care | Payer: Medicare Other | Source: Ambulatory Visit | Attending: Obstetrics and Gynecology | Admitting: Obstetrics and Gynecology

## 2013-10-31 ENCOUNTER — Encounter (HOSPITAL_COMMUNITY): Payer: Self-pay | Admitting: Pharmacy Technician

## 2013-10-31 ENCOUNTER — Ambulatory Visit (HOSPITAL_COMMUNITY)
Admission: RE | Admit: 2013-10-31 | Discharge: 2013-10-31 | Disposition: A | Discharge status: Home / Self Care | Payer: Medicare Other | Source: Ambulatory Visit | Attending: Obstetrics and Gynecology | Admitting: Obstetrics and Gynecology

## 2013-10-31 ENCOUNTER — Encounter (HOSPITAL_COMMUNITY): Payer: Self-pay

## 2013-10-31 DIAGNOSIS — Z01812 Encounter for preprocedural laboratory examination: Secondary | ICD-10-CM | POA: Insufficient documentation

## 2013-10-31 LAB — URINALYSIS, ROUTINE W REFLEX MICROSCOPIC
Bilirubin Urine: NEGATIVE
GLUCOSE, UA: NEGATIVE mg/dL
Hgb urine dipstick: NEGATIVE
Ketones, ur: NEGATIVE mg/dL
LEUKOCYTES UA: NEGATIVE
NITRITE: NEGATIVE
PH: 6 (ref 5.0–8.0)
Protein, ur: NEGATIVE mg/dL
Specific Gravity, Urine: 1.02 (ref 1.005–1.030)
Urobilinogen, UA: 0.2 mg/dL (ref 0.0–1.0)

## 2013-10-31 LAB — CBC
HCT: 38.6 % (ref 36.0–46.0)
HEMOGLOBIN: 12.8 g/dL (ref 12.0–15.0)
MCH: 27.4 pg (ref 26.0–34.0)
MCHC: 33.2 g/dL (ref 30.0–36.0)
MCV: 82.7 fL (ref 78.0–100.0)
Platelets: 245 10*3/uL (ref 150–400)
RBC: 4.67 MIL/uL (ref 3.87–5.11)
RDW: 14.1 % (ref 11.5–15.5)
WBC: 8.5 10*3/uL (ref 4.0–10.5)

## 2013-10-31 LAB — BASIC METABOLIC PANEL
Anion gap: 15 (ref 5–15)
BUN: 22 mg/dL (ref 6–23)
CHLORIDE: 102 meq/L (ref 96–112)
CO2: 26 mEq/L (ref 19–32)
Calcium: 9.7 mg/dL (ref 8.4–10.5)
Creatinine, Ser: 0.96 mg/dL (ref 0.50–1.10)
GFR calc Af Amer: 67 mL/min — ABNORMAL LOW (ref >90)
GFR calc non Af Amer: 58 mL/min — ABNORMAL LOW (ref >90)
GLUCOSE: 249 mg/dL — AB (ref 70–99)
POTASSIUM: 4.2 meq/L (ref 3.7–5.3)
SODIUM: 143 meq/L (ref 137–147)

## 2013-10-31 NOTE — Pre-Procedure Instructions (Signed)
Pt. Given info for MyChart. To be set up at home.

## 2013-10-31 NOTE — Telephone Encounter (Signed)
Pt plans for surgery confirmed:  Hysteroscopic resection of polyp and posterior repair.

## 2013-10-31 NOTE — Progress Notes (Signed)
10/31/13 1125  OBSTRUCTIVE SLEEP APNEA  Have you ever been diagnosed with sleep apnea through a sleep study? No  Do you snore loudly (loud enough to be heard through closed doors)?  0  Has anyone observed you stop breathing during your sleep? 0  Do you have, or are you being treated for high blood pressure? 1  BMI more than 35 kg/m2? 1  Age over 71 years old? 1  Neck circumference greater than 40 cm/16 inches? 1  Gender: 0  Obstructive Sleep Apnea Score 4  Score 4 or greater  Results sent to PCP

## 2013-11-02 ENCOUNTER — Other Ambulatory Visit: Payer: Self-pay | Admitting: Family Medicine

## 2013-11-04 NOTE — Progress Notes (Signed)
HPI  Patinet says that she feels occasional hard beats  Not regular  Can have a few in a day then none  At all   Usually when laying down or sitting at computer.   Hx asthma  Limits activity Goes to store  Gets SOB with vacuuming.  Has had that for a long time. Tried treadmill test  Flunked  BP greate than 200 in less than 2 min  Test stopped.   No CP  Breast surgery 2 months ago.   Has been on DM meds for 10 years  Doesn't check regularly  Allergies  Allergen Reactions  . Arimidex [Anastrozole] Other (See Comments)    Arthralgias and myalgias.  Improved with 3 week hiatus from drug.  Categorical side effect of drug class.  . Adhesive [Tape] Other (See Comments)    "red and blistered"  . Lisinopril     Dry hacking cough and tickle  . Tetracyclines & Related Other (See Comments)    unknown    Current Outpatient Prescriptions  Medication Sig Dispense Refill  . acetaminophen (TYLENOL) 500 MG tablet Take 500 mg by mouth as needed for moderate pain. Takes 2 when needed      . albuterol (PROVENTIL HFA;VENTOLIN HFA) 108 (90 BASE) MCG/ACT inhaler Inhale 2 puffs into the lungs every 4 (four) hours as needed.  6.7 g  6  . Aloe Vera Leaf POWD Apply 600 mg topically as needed (irritation).       Marland Kitchen b complex vitamins tablet Take 1 tablet by mouth daily.      Marland Kitchen Bioflavonoid Products (ESTER-C) 1000-50 MG TABS Take by mouth 2 (two) times daily.      . Cholecalciferol (VITAMIN D3) 2000 UNITS TABS Take 2,000 Units by mouth 2 (two) times daily.      Mariane Baumgarten Calcium (STOOL SOFTENER PO) Take 1 capsule by mouth as needed (constipation).       . fluocinonide ointment (LIDEX) 0.05 % Apply topically 2 (two) times daily as needed.  30 g  2  . Ginkgo Biloba 120 MG CAPS Take 1 capsule by mouth daily.       Marland Kitchen glipiZIDE (GLUCOTROL) 10 MG tablet TAKE 1 TABLET BY MOUTH TWICE DAILY BEFORE A MEAL  180 tablet  0  . ibuprofen (ADVIL,MOTRIN) 800 MG tablet Take 800 mg by mouth as needed for moderate pain.       Marland Kitchen  JANUVIA 50 MG tablet TAKE 1 TABLET BY MOUTH EVERY DAY  30 tablet  6  . KRILL OIL PO Take 1 capsule by mouth 2 (two) times daily.      . Melatonin 3 MG TABS Take 3 mg by mouth at bedtime as needed and may repeat dose one time if needed (sleep).       . metFORMIN (GLUCOPHAGE) 1000 MG tablet TAKE 1 TABLET BY MOUTH TWICE DAILY WITH A MEAL  180 tablet  0  . mometasone-formoterol (DULERA) 100-5 MCG/ACT AERO INHALE 2 PUFFS BY MOUTH TWICE DAILY  39 g  6  . montelukast (SINGULAIR) 10 MG tablet TAKE 1 TABLET BY MOUTH EVERY NIGHT AT BEDTIME  90 tablet  3  . naftifine (NAFTIN) 1 % cream Apply 1 application topically daily.      . naproxen (NAPROSYN) 500 MG tablet Take 1 tablet (500 mg total) by mouth 2 (two) times daily as needed.  180 tablet  1  . NONFORMULARY OR COMPOUNDED ITEM Apply 1 application topically 2 (two) times daily as needed. Ketoconazole & fluticasone  cream 1:1      . simethicone (MYLICON) 80 MG chewable tablet Chew 80 mg by mouth as needed for flatulence.       . simvastatin (ZOCOR) 40 MG tablet TAKE 1 TABLET BY MOUTH EVERY EVENING  90 tablet  0  . tamoxifen (NOLVADEX) 20 MG tablet Take 20 mg by mouth at bedtime.      . triamterene-hydrochlorothiazide (MAXZIDE) 75-50 MG per tablet TAKE 1 TABLET BY MOUTH EVERY DAY  90 tablet  3   No current facility-administered medications for this visit.    Past Medical History  Diagnosis Date  . Hypertension   . Hyperlipidemia   . Diabetes mellitus   . Asthma     spirometry (2011)- mild ventilary defect  . Cancer 2012    Breast  . Arthritis     Osteoarthritis  . Fatty liver 2013    enlarged  . Sliding hiatal hernia   . Ventral hernia, unspecified, without mention of obstruction or gangrene   . Lung nodule seen on imaging study 2013  . HSV (herpes simplex virus) infection   . Lymphedema     Left arm  . 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 Calcification of left breast   . Arthritis   . Shortness of breath      with exertion  . Sleep apnea     Stop Bang score of 5. Pt has had sleep study, but was shown to be negative for sleep apnea.  Marland Kitchen Dysrhythmia     palpatations    Past Surgical History  Procedure Laterality Date  . Cesarean section    . 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 Breast surgery  jan 2013  . Hand ligament reconstruction Right   . Cataract extraction, bilateral      on different occassions   . Shoulder arthroscopy with open rotator cuff repair Left 07/26/2012  . Breast biopsy  06/22/13  . Breast biopsy Left 09/12/2013    Procedure: BREAST BIOPSY WITH NEEDLE LOCALIZATION;  Surgeon: Jamesetta So, MD;  Location: [facility];  Service: General;  Laterality: Left;    Family History  Problem Relation Age of Onset  . Hypertension Mother   . Heart disease Mother   . Diabetes Mother   . Kidney disease Mother     ESRD  . Cancer Mother     Bladder  . Hypertension Father   . Hyperlipidemia Father   . Heart disease Father   . Diabetes Father   . Cancer Father     Stomach Cancer  . Diabetes Sister   . Heart disease Sister   . Cancer Maternal Uncle     prostate  . Alzheimer's disease Paternal Aunt   . Cancer Paternal Uncle     stomach  . Stroke Maternal Grandfather   . Cancer Paternal Grandfather     stomach    History   Social History  . Marital Status: Married    Spouse Name: N/A    Number of Children: N/A  . Years of Education: N/A   Occupational History  . retired      Product manager for [employer]   Social History Main Topics  . Smoking status: Passive Smoke Exposure - Never Smoker  . Smokeless tobacco: Never Used     Comment: husband smokes, but pt doesn't   . Alcohol Use: Yes     Comment: occasional drink   . Drug Use: No  .  Sexual Activity: Not Currently    Birth Control/ Protection: Post-menopausal   Other Topics Concern  . Not on file   Social History Narrative  . No narrative on file    Review of Systems:  All systems  reviewed.  They are negative to the above problem except as previously stated.  Vital Signs: BP 134/68  Pulse 88  Ht 5\' 5"  (1.651 m)  Wt 247 lb (112.038 kg)  BMI 41.10 kg/m2  Physical Exam  HEENT:  Normocephalic, atraumatic. EOMI, PERRLA.  Neck: JVP is normal.  No bruits.  Lungs: clear to auscultation. No rales no wheezes.  Heart: Regular rate and rhythm. Normal S1, S2. No S3.   No significant murmurs. PMI not displaced.  Abdomen:  Supple, nontender. Normal bowel sounds. No masses. No hepatomegaly.  Extremities:   Good distal pulses throughout. No lower extremity edema.  Musculoskeletal :moving all extremities.  Neuro:   alert and oriented x3.  CN II-XII grossly intact.  EKG SR 90   Assessment and Plan:  Patient is a morbidly obese 73 yo who is not very active  Gets SOB with activity   This has been a long standing problem for years Presents for evalutaion of palpitations.  She wore a holter monitor from her primary are  They cannot find it.   WIll wait to make recommendations when it is faxed for review  She is due for surgery tomorrow  A transvaginal procedure.  She is probably at low to mod risk for this  If it were an intralabdomeinal approach would need further testing.

## 2013-11-05 ENCOUNTER — Telehealth: Payer: Self-pay | Admitting: *Deleted

## 2013-11-05 ENCOUNTER — Other Ambulatory Visit: Payer: Self-pay | Admitting: *Deleted

## 2013-11-05 ENCOUNTER — Encounter: Payer: Self-pay | Admitting: Internal Medicine

## 2013-11-05 ENCOUNTER — Other Ambulatory Visit: Payer: Self-pay | Admitting: Family Medicine

## 2013-11-05 ENCOUNTER — Ambulatory Visit (INDEPENDENT_AMBULATORY_CARE_PROVIDER_SITE_OTHER): Payer: Medicare Other | Admitting: Internal Medicine

## 2013-11-05 ENCOUNTER — Encounter (HOSPITAL_COMMUNITY): Payer: Self-pay | Admitting: Pharmacy Technician

## 2013-11-05 VITALS — BP 134/68 | HR 88 | Ht 65.0 in | Wt 247.0 lb

## 2013-11-05 DIAGNOSIS — I10 Essential (primary) hypertension: Secondary | ICD-10-CM

## 2013-11-05 MED ORDER — MOMETASONE FURO-FORMOTEROL FUM 100-5 MCG/ACT IN AERO: INHALATION_SPRAY | RESPIRATORY_TRACT | Status: DC

## 2013-11-05 NOTE — Telephone Encounter (Signed)
Called Dr Dorian Heckle office for the 24 hour Holter monitor results. They stated , "it does not look like we have received the holter monitor results yet." Their office is to call and fax over the results as soon as they get them.

## 2013-11-05 NOTE — Telephone Encounter (Signed)
Refill appropriate and filled per protocol. 

## 2013-11-05 NOTE — Telephone Encounter (Signed)
Made pt aware

## 2013-11-05 NOTE — Patient Instructions (Signed)
Your physician recommends that you schedule a follow-up appointment in: To be determined.  The office will call you once we get results from your holter monitor

## 2013-11-05 NOTE — Telephone Encounter (Signed)
Call patient and tell her I am waiting on holter results.  No recommmendations for now.

## 2013-11-06 ENCOUNTER — Encounter (HOSPITAL_COMMUNITY): Payer: Self-pay | Admitting: *Deleted

## 2013-11-06 ENCOUNTER — Inpatient Hospital Stay (HOSPITAL_COMMUNITY): Payer: Medicare Other | Admitting: Anesthesiology

## 2013-11-06 ENCOUNTER — Encounter (HOSPITAL_COMMUNITY): Payer: Medicare Other | Admitting: Anesthesiology

## 2013-11-06 ENCOUNTER — Encounter (HOSPITAL_COMMUNITY)
Admission: RE | Disposition: A | Discharge status: Home / Self Care | Payer: Self-pay | Source: Ambulatory Visit | Attending: Obstetrics and Gynecology

## 2013-11-06 ENCOUNTER — Ambulatory Visit (HOSPITAL_COMMUNITY)
Admission: RE | Admit: 2013-11-06 | Discharge: 2013-11-06 | Disposition: A | Discharge status: Home / Self Care | Payer: Medicare Other | Source: Ambulatory Visit | Attending: Obstetrics and Gynecology | Admitting: Obstetrics and Gynecology

## 2013-11-06 DIAGNOSIS — I10 Essential (primary) hypertension: Secondary | ICD-10-CM | POA: Insufficient documentation

## 2013-11-06 DIAGNOSIS — N840 Polyp of corpus uteri: Secondary | ICD-10-CM | POA: Insufficient documentation

## 2013-11-06 DIAGNOSIS — Z79899 Other long term (current) drug therapy: Secondary | ICD-10-CM | POA: Insufficient documentation

## 2013-11-06 DIAGNOSIS — F17200 Nicotine dependence, unspecified, uncomplicated: Secondary | ICD-10-CM | POA: Insufficient documentation

## 2013-11-06 DIAGNOSIS — C50919 Malignant neoplasm of unspecified site of unspecified female breast: Secondary | ICD-10-CM | POA: Diagnosis not present

## 2013-11-06 DIAGNOSIS — E785 Hyperlipidemia, unspecified: Secondary | ICD-10-CM | POA: Insufficient documentation

## 2013-11-06 DIAGNOSIS — N289 Disorder of kidney and ureter, unspecified: Secondary | ICD-10-CM | POA: Insufficient documentation

## 2013-11-06 DIAGNOSIS — N816 Rectocele: Secondary | ICD-10-CM | POA: Diagnosis not present

## 2013-11-06 DIAGNOSIS — E119 Type 2 diabetes mellitus without complications: Secondary | ICD-10-CM | POA: Insufficient documentation

## 2013-11-06 HISTORY — PX: POLYPECTOMY: SHX5525

## 2013-11-06 HISTORY — PX: DILATION AND CURETTAGE OF UTERUS: SHX78

## 2013-11-06 HISTORY — PX: RECTOCELE REPAIR: SHX761

## 2013-11-06 HISTORY — PX: HYSTEROSCOPY WITH D & C: SHX1775

## 2013-11-06 LAB — GLUCOSE, CAPILLARY: GLUCOSE-CAPILLARY: 145 mg/dL — AB (ref 70–99)

## 2013-11-06 SURGERY — DILATATION AND CURETTAGE /HYSTEROSCOPY
Anesthesia: General

## 2013-11-06 MED ORDER — PROPOFOL 10 MG/ML IV EMUL
INTRAVENOUS | Status: AC
  Filled 2013-11-06: qty 20

## 2013-11-06 MED ORDER — GLYCOPYRROLATE 0.2 MG/ML IJ SOLN
INTRAMUSCULAR | Status: DC | PRN
  Administered 2013-11-06: .2 mg via INTRAVENOUS

## 2013-11-06 MED ORDER — MIDAZOLAM HCL 2 MG/2ML IJ SOLN
1.0000 mg | INTRAMUSCULAR | Status: DC | PRN
  Administered 2013-11-06: 2 mg via INTRAVENOUS

## 2013-11-06 MED ORDER — FENTANYL CITRATE 0.05 MG/ML IJ SOLN
INTRAMUSCULAR | Status: DC | PRN
  Administered 2013-11-06 (×5): 50 ug via INTRAVENOUS

## 2013-11-06 MED ORDER — BUPIVACAINE-EPINEPHRINE (PF) 0.5% -1:200000 IJ SOLN
INTRAMUSCULAR | Status: AC
  Filled 2013-11-06: qty 30

## 2013-11-06 MED ORDER — CEFAZOLIN SODIUM-DEXTROSE 2-3 GM-% IV SOLR
INTRAVENOUS | Status: AC
  Filled 2013-11-06: qty 50

## 2013-11-06 MED ORDER — CEFAZOLIN SODIUM-DEXTROSE 2-3 GM-% IV SOLR
2.0000 g | INTRAVENOUS | Status: AC
  Administered 2013-11-06: 2 g via INTRAVENOUS

## 2013-11-06 MED ORDER — CIPROFLOXACIN HCL 500 MG PO TABS: 500.0000 mg | ORAL_TABLET | Freq: Two times a day (BID) | ORAL | Status: DC

## 2013-11-06 MED ORDER — LACTATED RINGERS IV SOLN
INTRAVENOUS | Status: DC
  Administered 2013-11-06: 1000 mL via INTRAVENOUS
  Administered 2013-11-06: 09:00:00 via INTRAVENOUS

## 2013-11-06 MED ORDER — FENTANYL CITRATE 0.05 MG/ML IJ SOLN
INTRAMUSCULAR | Status: AC
  Filled 2013-11-06: qty 5

## 2013-11-06 MED ORDER — SODIUM CHLORIDE 0.9 % IR SOLN
Status: DC | PRN
  Administered 2013-11-06: 1000 mL

## 2013-11-06 MED ORDER — NEOSTIGMINE METHYLSULFATE 10 MG/10ML IV SOLN
INTRAVENOUS | Status: DC | PRN
  Administered 2013-11-06: 1 mg via INTRAVENOUS

## 2013-11-06 MED ORDER — LIDOCAINE HCL 1 % IJ SOLN
INTRAMUSCULAR | Status: DC | PRN
  Administered 2013-11-06: 30 mg via INTRADERMAL

## 2013-11-06 MED ORDER — SODIUM CHLORIDE 0.9 % IR SOLN
Status: DC | PRN
Start: 2013-11-06 — End: 2013-11-06
  Administered 2013-11-06: 1000 mL

## 2013-11-06 MED ORDER — GLYCOPYRROLATE 0.2 MG/ML IJ SOLN
INTRAMUSCULAR | Status: AC
  Filled 2013-11-06: qty 1

## 2013-11-06 MED ORDER — ROCURONIUM BROMIDE 50 MG/5ML IV SOLN
INTRAVENOUS | Status: AC
  Filled 2013-11-06: qty 1

## 2013-11-06 MED ORDER — MIDAZOLAM HCL 2 MG/2ML IJ SOLN
INTRAMUSCULAR | Status: AC
  Filled 2013-11-06: qty 2

## 2013-11-06 MED ORDER — KETOROLAC TROMETHAMINE 30 MG/ML IJ SOLN
30.0000 mg | Freq: Once | INTRAMUSCULAR | Status: AC
  Administered 2013-11-06: 30 mg via INTRAVENOUS
  Filled 2013-11-06: qty 1

## 2013-11-06 MED ORDER — PROPOFOL 10 MG/ML IV BOLUS
INTRAVENOUS | Status: DC | PRN
  Administered 2013-11-06: 160 mg via INTRAVENOUS

## 2013-11-06 MED ORDER — FENTANYL CITRATE 0.05 MG/ML IJ SOLN
25.0000 ug | INTRAMUSCULAR | Status: DC | PRN
  Administered 2013-11-06: 50 ug via INTRAVENOUS
  Filled 2013-11-06: qty 2

## 2013-11-06 MED ORDER — ONDANSETRON HCL 4 MG/2ML IJ SOLN
4.0000 mg | Freq: Once | INTRAMUSCULAR | Status: AC
  Administered 2013-11-06: 4 mg via INTRAVENOUS

## 2013-11-06 MED ORDER — SUCCINYLCHOLINE CHLORIDE 20 MG/ML IJ SOLN
INTRAMUSCULAR | Status: DC | PRN
Start: 2013-11-06 — End: 2013-11-06
  Administered 2013-11-06: 120 mg via INTRAVENOUS

## 2013-11-06 MED ORDER — SODIUM CHLORIDE 0.9 % IJ SOLN
INTRAMUSCULAR | Status: AC
  Filled 2013-11-06: qty 10

## 2013-11-06 MED ORDER — ONDANSETRON HCL 4 MG/2ML IJ SOLN
INTRAMUSCULAR | Status: AC
  Filled 2013-11-06: qty 2

## 2013-11-06 MED ORDER — BUPIVACAINE-EPINEPHRINE 0.5% -1:200000 IJ SOLN
INTRAMUSCULAR | Status: DC | PRN
  Administered 2013-11-06: 20 mL

## 2013-11-06 MED ORDER — ONDANSETRON HCL 4 MG/2ML IJ SOLN: 4.0000 mg | Freq: Once | INTRAMUSCULAR | Status: DC | PRN

## 2013-11-06 MED ORDER — ROCURONIUM BROMIDE 100 MG/10ML IV SOLN
INTRAVENOUS | Status: DC | PRN
  Administered 2013-11-06: 5 mg via INTRAVENOUS
  Administered 2013-11-06: 25 mg via INTRAVENOUS

## 2013-11-06 MED ORDER — LIDOCAINE HCL (PF) 1 % IJ SOLN
INTRAMUSCULAR | Status: AC
  Filled 2013-11-06: qty 5

## 2013-11-06 SURGICAL SUPPLY — 41 items
BAG HAMPER (MISCELLANEOUS) ×3 IMPLANT
CATH ROBINSON RED A/P 16FR (CATHETERS) ×3 IMPLANT
CLOTH BEACON ORANGE TIMEOUT ST (SAFETY) ×3 IMPLANT
COVER LIGHT HANDLE STERIS (MISCELLANEOUS) ×6 IMPLANT
DECANTER SPIKE VIAL GLASS SM (MISCELLANEOUS) ×3 IMPLANT
DRAPE PROXIMA HALF (DRAPES) ×3 IMPLANT
DRAPE STERI URO 9X17 APER PCH (DRAPES) ×3 IMPLANT
ELECT REM PT RETURN 9FT ADLT (ELECTROSURGICAL) ×3
ELECTRODE REM PT RTRN 9FT ADLT (ELECTROSURGICAL) ×1 IMPLANT
FORMALIN 10 PREFIL 120ML (MISCELLANEOUS) ×3 IMPLANT
FORMALIN 10 PREFIL 480ML (MISCELLANEOUS) IMPLANT
GAUZE PACKING 2X5 YD STRL (GAUZE/BANDAGES/DRESSINGS) ×3 IMPLANT
GLOVE BIOGEL PI IND STRL 7.0 (GLOVE) ×2 IMPLANT
GLOVE BIOGEL PI IND STRL 9 (GLOVE) ×1 IMPLANT
GLOVE BIOGEL PI INDICATOR 7.0 (GLOVE) ×4
GLOVE BIOGEL PI INDICATOR 9 (GLOVE) ×2
GLOVE ECLIPSE 6.5 STRL STRAW (GLOVE) ×3 IMPLANT
GLOVE ECLIPSE 7.0 STRL STRAW (GLOVE) ×3 IMPLANT
GLOVE ECLIPSE 9.0 STRL (GLOVE) ×6 IMPLANT
GLOVE EXAM NITRILE MD LF STRL (GLOVE) ×6 IMPLANT
GOWN SPEC L3 XXLG W/TWL (GOWN DISPOSABLE) ×6 IMPLANT
GOWN STRL REUS W/TWL LRG LVL3 (GOWN DISPOSABLE) ×3 IMPLANT
INST SET HYSTEROSCOPY (KITS) ×3 IMPLANT
IV NS 1000ML (IV SOLUTION) ×2
IV NS 1000ML BAXH (IV SOLUTION) ×1 IMPLANT
KIT ROOM TURNOVER AP CYSTO (KITS) ×3 IMPLANT
KIT ROOM TURNOVER APOR (KITS) ×3 IMPLANT
MANIFOLD NEPTUNE II (INSTRUMENTS) ×3 IMPLANT
NEEDLE HYPO 25X1 1.5 SAFETY (NEEDLE) ×3 IMPLANT
NS IRRIG 1000ML POUR BTL (IV SOLUTION) ×3 IMPLANT
PACK PERI GYN (CUSTOM PROCEDURE TRAY) ×3 IMPLANT
PAD ARMBOARD 7.5X6 YLW CONV (MISCELLANEOUS) ×3 IMPLANT
PAD TELFA 3X4 1S STER (GAUZE/BANDAGES/DRESSINGS) ×3 IMPLANT
SET BASIN LINEN APH (SET/KITS/TRAYS/PACK) ×3 IMPLANT
SET CYSTO W/LG BORE CLAMP LF (SET/KITS/TRAYS/PACK) ×3 IMPLANT
SET IV ADMIN VERSALIGHT (MISCELLANEOUS) ×3 IMPLANT
SUT CHROMIC 2 0 CT 1 (SUTURE) ×3 IMPLANT
SUT PROLENE 2 0 FS (SUTURE) IMPLANT
SUT VIC AB 0 CT2 8-18 (SUTURE) ×3 IMPLANT
TRAY FOLEY CATH 16FR SILVER (SET/KITS/TRAYS/PACK) IMPLANT
VERSALIGHT (MISCELLANEOUS) ×3 IMPLANT

## 2013-11-06 NOTE — Progress Notes (Signed)
Dr. Glo Herring in to see patient stated if could eat and ambulate safely would let her go home. He would return at 1:30 .

## 2013-11-06 NOTE — Discharge Instructions (Signed)
Rectocele/Enterocele, Care After A woman's birth canal (vagina) can become weak or stretched. This can be caused by childbirth, heavy lifting, lasting (chronic) constipation, aging, or pelvic surgery. When the vagina is weak and stretched, parts of the intestine can bulge into the vagina by pushing against the vaginal walls. A rectocele is when the very end of the large intestine (rectum) causes the bulge. An enterocele is when the small intestine causes the bulge. Surgery to fix this problem is usually done through the vagina. If you just had this surgery, you were probably given a drug to make you sleep (general anesthetic) or a drug that numbs you from the waist down (spinal/epidural). Here is what happened:  The small intestine or rectum was pushed back to its normal place.  The vaginal wall was made stronger. Sometimes this is done with stitches or a mesh-like material. HOME CARE INSTRUCTIONS  Some women go home the same day as their surgery. Others stay in the hospital for a few days. This depends on the size and type of repair.  Pain and Medications  Some pain is normal after this surgery. Only take pain medicine your surgeon prescribed. Follow the directions carefully.  Do not take aspirin. It can cause bleeding.  Do not drink alcohol while taking pain medication.  You may be given a medicine (antibiotic) that kills germs. Follow the directions carefully.  Take warm sitz baths 2 times a day to control discomfort and reduce any swelling. Take sitz baths with your caregiver's permission. Diet  Go back to your normal eating as directed by your caregiver.  Drink a lot of fluids. Drink at least 6 glasses of water every day. Activity  Move around and walk as much as possible. This can keep blood clots from forming in your legs.  Do not climb stairs until your caregiver says it is okay.  Do not lift objects 5 pounds (2.3 kg) or heavier. Do not bend or strain for 6 to 8 weeks.  Do  not drive until after you stop taking pain medicine and your caregiver says it is okay.  Your return to work will depend on the type of work you do. Ask your caregiver what is best for you.  Ask your caregiver when you can resume sexual activity. Most women can start having sex in about 6 weeks after their surgery.  Get plenty of rest during the day and sleep at night.  Have someone help you with your household chores and activities for 3 to 4 weeks. Other Precautions  You may have some discharge from the vagina for a few weeks after the surgery. It may have small amounts of blood in it. This is normal. If you have questions, ask your caregiver.  Do not use tampons or douche.  You should be able to take a shower a day after your surgery. Do not take a tub bath for at least a week.  Take it easy for awhile. You should feel much better in 2 to 3 weeks. It may take up to 6 weeks to feel completely normal.  Keep all follow-up appointments.  Take your temperature twice a day and write it down.  Make sure your family understands everything about your surgery and recovery. SEEK MEDICAL CARE IF:   You have any questions about your medication, or you need stronger pain medication.  Pain continues, even after taking pain medication.  You become constipated.  You have an oral temperature above 102 F (38.9 C).    You develop swelling and redness in the surgery area.  You become dizzy or lightheaded.  You feel sick to your stomach (nauseous), throw up (vomit), or have diarrhea.  You develop a rash.  You have a reaction to your medications. SEEK IMMEDIATE MEDICAL CARE IF:   Pain gets worse.  You have new bleeding from your vagina.  Discharge from the vagina becomes heavy, or it has a bad smell.  You have an oral temperature above 102 F (38.9 C), not controlled by medicine.  You develop belly (abdominal) pain.  You develop chest pain.  You develop shortness of  breath.  You pass out (faint).  You develop pain, swelling, or redness in the leg.  You have pain or burning with urination.  You have bloody urine or cannot urinate. MAKE SURE YOU:   Understand these instructions.  Will watch your condition.  Will get help right away if you are not doing well or get worse. Document Released: 06/30/2009 Document Revised: 01/24/2013 Document Reviewed: 06/30/2009 Exit[employer] Patient Information 2015 [employer]. This information is not intended to replace advice given to you by your health care provider. Make sure you discuss any questions you have with your health care provider.  

## 2013-11-06 NOTE — Progress Notes (Signed)
Patient had eaten a diabetic tray without nausea. Pain still minimal. Removed vaginal plug per order. Ambulated patient to post-op without difficulty. Patient to be discharged from there.

## 2013-11-06 NOTE — Progress Notes (Signed)
Spoke with dr Glo Herring regarding patient. Doing well minima bleeding had been up to bedside commode tolerating drink well. Minimal pain. Harlin Heys to come check on patient.

## 2013-11-06 NOTE — Anesthesia Procedure Notes (Signed)
Procedure Name: Intubation Date/Time: 11/06/2013 7:44 AM Performed by: Tressie Stalker E Pre-anesthesia Checklist: Patient identified, Patient being monitored, Timeout performed, Emergency Drugs available and Suction available Patient Re-evaluated:Patient Re-evaluated prior to inductionOxygen Delivery Method: Circle System Utilized Preoxygenation: Pre-oxygenation with 100% oxygen Intubation Type: IV induction Ventilation: Mask ventilation without difficulty Laryngoscope Size: Mac and 3 Grade View: Grade I Tube type: Oral Tube size: 7.0 mm Number of attempts: 1 Airway Equipment and Method: stylet Placement Confirmation: ETT inserted through vocal cords under direct vision,  positive ETCO2 and breath sounds checked- equal and bilateral Secured at: 21 cm Tube secured with: Tape Dental Injury: Teeth and Oropharynx as per pre-operative assessment

## 2013-11-06 NOTE — Anesthesia Preprocedure Evaluation (Signed)
Anesthesia Evaluation  Patient identified by MRN, date of birth, ID band Patient awake    Reviewed: Allergy & Precautions, H&P , NPO status , Patient's Chart, lab work & pertinent test results  Airway Mallampati: I TM Distance: >3 FB Neck ROM: Full    Dental  (+) Teeth Intact   Pulmonary shortness of breath and with exertion, asthma , sleep apnea ,  breath sounds clear to auscultation        Cardiovascular hypertension, Pt. on medications Rhythm:Regular Rate:Normal     Neuro/Psych PSYCHIATRIC DISORDERS    GI/Hepatic hiatal hernia,   Endo/Other  diabetes, Well Controlled, Type 2  Renal/GU Renal disease     Musculoskeletal   Abdominal   Peds  Hematology   Anesthesia Other Findings   Reproductive/Obstetrics                           Anesthesia Physical Anesthesia Plan  ASA: III  Anesthesia Plan: General   Post-op Pain Management:    Induction: Intravenous  Airway Management Planned: Oral ETT  Additional Equipment:   Intra-op Plan:   Post-operative Plan: Extubation in OR  Informed Consent: I have reviewed the patients History and Physical, chart, labs and discussed the procedure including the risks, benefits and alternatives for the proposed anesthesia with the patient or authorized representative who has indicated his/her understanding and acceptance.     Plan Discussed with:   Anesthesia Plan Comments:         Anesthesia Quick Evaluation

## 2013-11-06 NOTE — Op Note (Signed)
11/06/2013  9:13 AM  PATIENT:  Julie Clay  73 y.o. female  PRE-OPERATIVE DIAGNOSIS:  Rectocele Polyp endometrial  POST-OPERATIVE DIAGNOSIS:  Rectocele Polyp endometrial  PROCEDURE:  Procedure(s): DILATATION AND CURETTAGE /HYSTEROSCOPY (N/A) POLYPECTOMY (REMOVAL ENDOMETRIAL POLYP) (N/A) POSTERIOR REPAIR (RECTOCELE) (N/A)  SURGEON:  Surgeon(s) and Role:    * Jonnie Kind, MD - Primary  PHYSICIAN ASSISTANT:   ASSISTANTS: Sherri Sear RN  ANESTHESIA:   local and general  EBL:  Total I/O In: 1000 [I.V.:1000] Out: 250 [Urine:200; Blood:50]  BLOOD ADMINISTERED:none  DRAINS: betadine soaked vaginal pack   LOCAL MEDICATIONS USED:  MARCAINE    and Amount: 20 ml  SPECIMEN:  Endometrial polyp  DISPOSITION OF SPECIMEN:  N/A  COUNTS:  YES  TOURNIQUET:  * No tourniquets in log *  DICTATION: .Dragon Dictation  PLAN OF CARE: Admit for overnight observation  PATIENT DISPOSITION:  PACU - hemodynamically stable.   Delay start of Pharmacological VTE agent (>24hrs) due to surgical blood loss or risk of bleeding: not applicable   Details of procedure:   Patient was taken to the operating room prepped for vaginal procedure with timeout conducted and surgical procedure confirmed by operative team. Ancef 2 g administered preoperatively. Legs were supported in yellowfin support in standard lithotomy position. Draping and placement of vaginal "bib" were put into place. Hysteroscopy: The cervix is grasped single-tooth tenaculum with speculum in place, and the uterus sounded in the midplane to 7-1/2 cm, dilated to 25 [place] allowing inspection of the rigid 30 operative hysteroscope which identified a thin atrophic endometrium with a 1 cm polyp noted at 9:00 on the endometrial upper fundal surface. Photos were taken. The polyp could be excised sharply using hysteroscopic scissors and extracted for specimen. Repeat inspection with the hysteroscope showed there was absolutely no tissue  remaining that could be removed as sample. Photos documented a smooth clean endometrial cavity. Specimen was passed off for histology procedure was completed and patient then prepared for the  posterior repair.  Allis clamps were placed at 5 and 7:00 on the hymen remnants, and the large posterior rectocele bulge was identified. The vaginal epithelium was injected with Marcaine with epinephrine x10 cc to allow for tissue plane identification and anesthesia and reduced vascularity. Midline incision was made through the epithelium and sharp dissection used to expose the rectocele. With significant dissection laterally and cephalad. Placement right index finger in the rectum from beneath the vaginal bib, and used its position during the next portion the case to ensure that the rectum was protected during placement stitches. Allis clamps were placed laterally in the lateral connective tissue grasping strong supportive tissue. It appeared that the paravaginal defect was to the left of the midline, as more tissue could be identified that was mobile and able to be pulled across and cephalad  from the right side, pulling it to the left and upward a series of 4 interrupted 0 Vicryl sutures were placed and tagged building a strong rectovaginal septum. The right index finger was removed from the rectum, protective gauze placed to cover the anus, and the sutures tied down with significant improvement and perineal support. Generous irrigation of the surgical field was performed. An additional 3 horizontal mattress sutures were placed to further build up the perineal body resulting in significant improvement in the posterior support. Redundant vaginal epithelium was trimmed slightly, then reapproximated with 3 interrupted sutures of 2-0 chromic. The examination showed significantly improved support from the vaginal side and repeat rectal exam showed  that improved support and could be confirmed posteriorly as well and there were  no stitches involving the rectum vaginal packing with Betadine soaked gauze was placed and in and out catheterization performed obtaining 100 cc of clear urine. Patient to recovery in good condition sponge and needle counts correct.

## 2013-11-06 NOTE — Brief Op Note (Signed)
11/06/2013  9:13 AM  PATIENT:  Julie Clay  73 y.o. female  PRE-OPERATIVE DIAGNOSIS:  Rectocele Polyp endometrial  POST-OPERATIVE DIAGNOSIS:  Rectocele Polyp endometrial  PROCEDURE:  Procedure(s): DILATATION AND CURETTAGE /HYSTEROSCOPY (N/A) POLYPECTOMY (REMOVAL ENDOMETRIAL POLYP) (N/A) POSTERIOR REPAIR (RECTOCELE) (N/A)  SURGEON:  Surgeon(s) and Role:    * Jonnie Kind, MD - Primary  PHYSICIAN ASSISTANT:   ASSISTANTS: Sherri Sear RN  ANESTHESIA:   local and general  EBL:  Total I/O In: 1000 [I.V.:1000] Out: 250 [Urine:200; Blood:50]  BLOOD ADMINISTERED:none  DRAINS: betadine soaked vaginal pack   LOCAL MEDICATIONS USED:  MARCAINE    and Amount: 20 ml  SPECIMEN:  No Specimen  DISPOSITION OF SPECIMEN:  N/A  COUNTS:  YES  TOURNIQUET:  * No tourniquets in log *  DICTATION: .Dragon Dictation  PLAN OF CARE: Admit for overnight observation  PATIENT DISPOSITION:  PACU - hemodynamically stable.   Delay start of Pharmacological VTE agent (>24hrs) due to surgical blood loss or risk of bleeding: not applicable

## 2013-11-06 NOTE — Interval H&P Note (Signed)
History and Physical Interval Note:  11/06/2013 7:18 AM  [place]  has presented today for surgery, with the diagnosis of Rectocele Polyp endometrial  The various methods of treatment have been discussed with the patient and family. After consideration of risks, benefits and other options for treatment, the patient has consented to  Procedure(s): DILATATION AND CURETTAGE /HYSTEROSCOPY (N/A) POLYPECTOMY (REMOVAL ENDOMETRIAL POLYP) (N/A) POSTERIOR REPAIR (RECTOCELE) (N/A) as a surgical intervention .  The patient's history has been reviewed, patient examined, no change in status, stable for surgery.  I have reviewed the patient's chart and labs.  Questions were answered to the patient's satisfaction.     Jonnie Kind

## 2013-11-06 NOTE — Transfer of Care (Signed)
Immediate Anesthesia Transfer of Care Note  Patient: Julie Clay  Procedure(s) Performed: Procedure(s): DILATATION AND CURETTAGE /HYSTEROSCOPY (N/A) POLYPECTOMY (REMOVAL ENDOMETRIAL POLYP) (N/A) POSTERIOR REPAIR (RECTOCELE) (N/A)  Patient Location: PACU  Anesthesia Type:General  Level of Consciousness: awake  Airway & Oxygen Therapy: Patient Spontanous Breathing and Patient connected to face mask oxygen  Post-op Assessment: Report given to PACU RN  Post vital signs: Reviewed and stable  Complications: No apparent anesthesia complications

## 2013-11-06 NOTE — Anesthesia Postprocedure Evaluation (Signed)
  Anesthesia Post-op Note  Patient: Julie Clay  Procedure(s) Performed: Procedure(s): DILATATION AND CURETTAGE /HYSTEROSCOPY (N/A) POLYPECTOMY (REMOVAL ENDOMETRIAL POLYP) (N/A) POSTERIOR REPAIR (RECTOCELE) (N/A)  Patient Location: PACU  Anesthesia Type:General  Level of Consciousness: awake, alert  and oriented  Airway and Oxygen Therapy: Patient Spontanous Breathing and Patient connected to face mask oxygen  Post-op Pain: mild  Post-op Assessment: Post-op Vital signs reviewed, Patient's Cardiovascular Status Stable, Respiratory Function Stable, Patent Airway and No signs of Nausea or vomiting  Post-op Vital Signs: Reviewed and stable  Last Vitals:  Filed Vitals:   11/06/13 0725  BP: 121/55  Temp:   Resp: 16    Complications: No apparent anesthesia complications

## 2013-11-06 NOTE — H&P (View-Only) (Signed)
CC & HPI:   Julie Clay is a 73 y.o. female presenting today for discussion of timing of endometrial polypectomy and rectocele repair. Pt has breast surgery and knee surgery that she considers more important and I support that decision. Postpone 6 months and reassess.  ROS:   Negative except listed above  Pertinent History Reviewed:   Medical & Surgical Hx: Reviewed: Significant for  Past Medical History   Diagnosis  Date   .  Hypertension    .  Hyperlipidemia    .  Diabetes mellitus    .  Asthma      spirometry (2011)- mild ventilary defect   .  Cancer  2012     Breast   .  Arthritis      Osteoarthritis   .  Fatty liver  2013     enlarged   .  Sliding hiatal hernia    .  Ventral hernia, unspecified, without mention of obstruction or gangrene    .  Lung nodule seen on imaging study  2013   .  HSV (herpes simplex virus) infection    .  Lymphedema      Left arm   .  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  Calcification of left breast    .  Arthritis     Past Surgical History   Procedure  Laterality  Date   .  Cesarean section     .  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  Breast surgery   jan 2013   .  Hand ligament reconstruction     .  Cataract extraction, bilateral       on different occassions   .  Shoulder arthroscopy with open rotator cuff repair  Left  07/26/2012   .  Breast biopsy   06/22/13   Medications: Reviewed & Updated - see associated section  Social History: Reviewed - reports that she has been passively smoking. She has never used smokeless tobacco.  Objective Findings:   Vitals: BP 138/62  Ht 5\' 5"  (1.651 m)  Wt 246 lb 9.6 oz (111.857 kg)  BMI 41.04 kg/m2  Physical Examination: General appearance - alert, well appearing, and in no distress and oriented to person, place, and time  Physical Examination: General appearance - alert, well appearing, and in no distress and overweight Mental status - alert, oriented to  person, place, and time Eyes - pupils equal and reactive, extraocular eye movements intact Neck - supple, no significant adenopathy Heart - S1 and S2 normal Abdomen - soft, nontender, nondistended, no masses or organomegaly Pelvic - normal external genitalia, vulva, vagina, cervix, uterus and adnexa Extremities - peripheral pulses normal, no pedal edema, no clubbing or cyanosis  Pelvic - Physical Examination: Pelvic - VULVA: normal appearing vulva with no masses, tenderness or lesions, VAGINA: normal appearing vagina with normal color and discharge, no lesions, atrophic, PELVIC FLOOR EXAM: rectocele to 90, CERVIX: normal appearing cervix without discharge or lesions, pelvic laxity, UTERUS: uterus is normal size, shape, consistency and nontender, mobile, ADNEXA: normal adnexa in size, nontender and no masses\  Endometrial Biopsy:  Patient given informed consent, signed copy in the chart, time out was performed. Time out taken. The patient was placed in the lithotomy position and the cervix brought into view with sterile speculum. Portio of cervix cleansed x 2 with betadine swabs. A tenaculum was placed in the anterior lip of the  cervix.  The uterus was sounded for depth of 7 cm,.  Milex uterine Explora 3 mm was introduced to into the uterus, suction created, and an endometrial sample was obtained. All equipment was removed and accounted for.  The patient tolerated the procedure well.  Patient given post procedure instructions.  Plan: Hysteroscopy with removal of polyp and posterior repair in mid July     Assessment & Plan:   A:  1. Endometrial polyp  2. Rectocele  This is scheduled for hysteroscopy D&C removal of endometrial polyp and posterior repair on 11/07/18 15

## 2013-11-07 ENCOUNTER — Encounter (HOSPITAL_COMMUNITY): Payer: Self-pay | Admitting: Obstetrics and Gynecology

## 2013-11-07 LAB — GLUCOSE, CAPILLARY: Glucose-Capillary: 185 mg/dL — ABNORMAL HIGH (ref 70–99)

## 2013-11-08 ENCOUNTER — Telehealth: Payer: Self-pay | Admitting: Obstetrics and Gynecology

## 2013-11-08 NOTE — Telephone Encounter (Signed)
Pt states has not had a BM since surgery (rectocele on 11/06/2013) and is concerned. Pt encouraged to push fluids, increase foods high in fiber, continue miralax, and colace. If no BM by tomorrow call office back. Pt verbalized understanding.

## 2013-11-14 ENCOUNTER — Ambulatory Visit (INDEPENDENT_AMBULATORY_CARE_PROVIDER_SITE_OTHER): Payer: Self-pay | Admitting: Obstetrics and Gynecology

## 2013-11-14 ENCOUNTER — Encounter: Payer: Self-pay | Admitting: Obstetrics and Gynecology

## 2013-11-14 VITALS — BP 128/72 | Ht 65.0 in | Wt 247.0 lb

## 2013-11-14 DIAGNOSIS — Z9889 Other specified postprocedural states: Secondary | ICD-10-CM

## 2013-11-14 DIAGNOSIS — N816 Rectocele: Secondary | ICD-10-CM

## 2013-11-14 NOTE — Progress Notes (Signed)
This chart was scribed by Ludger Nutting, Medical Scribe, for Dr. Mallory Shirk on 11/14/13 at 12:17 PM. This chart was reviewed by Dr. Mallory Shirk for accuracy.   Subjective:  Julie Clay is a 73 y.o. female who presents to the clinic 1 weeks status post D&C, hysteroscopy, endometrial polypectomy, and posterior repair.   Review of Systems Negative except mild vaginal spotting  She has been eating a regular diet without difficulty.   Bowel movements are with Miralax. The patient is not having any pain.  Objective:  BP 128/72  Ht 5\' 5"  (1.651 m)  Wt 247 lb (112.038 kg)  BMI 41.10 kg/m2 General:Well developed, well nourished.  No acute distress. Abdomen: Bowel sounds normal, soft, non-tender. Pelvic Exam:    External Genitalia:  Normal.    Vagina: Normal    Bimanual: Normal    Cervix: Normal    Uterus: Normal    Adnexa: Normal  Incision(s):   Healing well, no drainage, no erythema, no hernia, no swelling, no dehiscence, incision well approximated.   Assessment:  Post-Op 1 weeks s/p D&C, hysteroscopy, endometrial polypectomy, and posterior repair.   Doing well postoperatively.   Plan:  1.Wound care discussed   2. .Continue any current medications. 3. Activity restrictions: none 4. return to work: now. 5. Follow up as needed

## 2013-11-19 ENCOUNTER — Telehealth: Payer: Self-pay | Admitting: Internal Medicine

## 2013-11-19 ENCOUNTER — Other Ambulatory Visit: Payer: Self-pay | Admitting: Family Medicine

## 2013-11-19 NOTE — Telephone Encounter (Signed)
Refill appropriate and filled per protocol. 

## 2013-11-19 NOTE — Telephone Encounter (Signed)
Pt calling for results. Holter monitor done on 10/12/13 found under ECG

## 2013-11-19 NOTE — Telephone Encounter (Signed)
Please return call regarding results from monitor from PCP.  tgs

## 2013-11-20 NOTE — Telephone Encounter (Signed)
I spoke with pt and relayed MD message

## 2013-11-20 NOTE — Telephone Encounter (Signed)
holter monitor shows rare and isolated PACs and PVCs Average HR 80s   I would not treat  Stay hydrated

## 2013-12-06 ENCOUNTER — Encounter (INDEPENDENT_AMBULATORY_CARE_PROVIDER_SITE_OTHER): Payer: Medicare Other | Admitting: Ophthalmology

## 2013-12-06 DIAGNOSIS — E1139 Type 2 diabetes mellitus with other diabetic ophthalmic complication: Secondary | ICD-10-CM | POA: Diagnosis not present

## 2013-12-06 DIAGNOSIS — E11319 Type 2 diabetes mellitus with unspecified diabetic retinopathy without macular edema: Secondary | ICD-10-CM

## 2013-12-06 DIAGNOSIS — I10 Essential (primary) hypertension: Secondary | ICD-10-CM

## 2013-12-06 DIAGNOSIS — H43819 Vitreous degeneration, unspecified eye: Secondary | ICD-10-CM

## 2013-12-06 DIAGNOSIS — E1165 Type 2 diabetes mellitus with hyperglycemia: Secondary | ICD-10-CM | POA: Diagnosis not present

## 2013-12-06 DIAGNOSIS — H35039 Hypertensive retinopathy, unspecified eye: Secondary | ICD-10-CM

## 2013-12-10 ENCOUNTER — Other Ambulatory Visit: Payer: Self-pay | Admitting: Family Medicine

## 2013-12-10 NOTE — Telephone Encounter (Signed)
Refill appropriate and filled per protocol. 

## 2013-12-12 ENCOUNTER — Encounter (INDEPENDENT_AMBULATORY_CARE_PROVIDER_SITE_OTHER): Payer: Medicare Other | Admitting: Ophthalmology

## 2013-12-12 DIAGNOSIS — H26499 Other secondary cataract, unspecified eye: Secondary | ICD-10-CM

## 2013-12-19 ENCOUNTER — Ambulatory Visit (INDEPENDENT_AMBULATORY_CARE_PROVIDER_SITE_OTHER): Payer: Medicare Other | Admitting: Ophthalmology

## 2013-12-19 DIAGNOSIS — H2700 Aphakia, unspecified eye: Secondary | ICD-10-CM

## 2013-12-19 LAB — HM DIABETES EYE EXAM

## 2014-01-15 ENCOUNTER — Telehealth: Payer: Self-pay | Admitting: *Deleted

## 2014-01-15 ENCOUNTER — Other Ambulatory Visit: Payer: Self-pay | Admitting: Family Medicine

## 2014-01-15 DIAGNOSIS — Z79899 Other long term (current) drug therapy: Secondary | ICD-10-CM

## 2014-01-15 DIAGNOSIS — E785 Hyperlipidemia, unspecified: Secondary | ICD-10-CM

## 2014-01-15 DIAGNOSIS — E119 Type 2 diabetes mellitus without complications: Secondary | ICD-10-CM

## 2014-01-15 NOTE — Telephone Encounter (Signed)
Pt called stating she is going to [facility] lab in the am and she wanted to have some lab orders fax over to them, I sent over Lipid and A1C to [facility].

## 2014-01-15 NOTE — Telephone Encounter (Signed)
Refill appropriate and filled per protocol. 

## 2014-01-16 ENCOUNTER — Encounter: Payer: Self-pay | Admitting: *Deleted

## 2014-01-16 DIAGNOSIS — E119 Type 2 diabetes mellitus without complications: Secondary | ICD-10-CM

## 2014-01-16 DIAGNOSIS — E785 Hyperlipidemia, unspecified: Secondary | ICD-10-CM | POA: Diagnosis not present

## 2014-01-16 LAB — LIPID PANEL
Cholesterol: 124 mg/dL (ref 0–200)
HDL: 43 mg/dL (ref >39)
LDL CALC: 39 mg/dL (ref 0–99)
Total CHOL/HDL Ratio: 2.9 Ratio
Triglycerides: 208 mg/dL — ABNORMAL HIGH (ref <150)
VLDL: 42 mg/dL — ABNORMAL HIGH (ref 0–40)

## 2014-01-16 LAB — HEPATIC FUNCTION PANEL
ALBUMIN: 3.5 g/dL (ref 3.5–5.2)
ALK PHOS: 37 U/L — AB (ref 39–117)
ALT: 43 U/L — ABNORMAL HIGH (ref 0–35)
AST: 38 U/L — ABNORMAL HIGH (ref 0–37)
BILIRUBIN DIRECT: 0.1 mg/dL (ref 0.0–0.3)
Indirect Bilirubin: 0.3 mg/dL (ref 0.2–1.2)
Total Bilirubin: 0.4 mg/dL (ref 0.2–1.2)
Total Protein: 6 g/dL (ref 6.0–8.3)

## 2014-01-16 LAB — HEMOGLOBIN A1C
Hgb A1c MFr Bld: 7.1 % — ABNORMAL HIGH (ref <5.7)
Mean Plasma Glucose: 157 mg/dL — ABNORMAL HIGH (ref <117)

## 2014-01-17 NOTE — Telephone Encounter (Signed)
This encounter was created in error - please disregard.

## 2014-01-18 ENCOUNTER — Ambulatory Visit (INDEPENDENT_AMBULATORY_CARE_PROVIDER_SITE_OTHER): Payer: Medicare Other | Admitting: Family Medicine

## 2014-01-18 VITALS — BP 110/60 | HR 78 | Temp 98.3°F | Resp 20 | Ht 65.0 in | Wt 252.0 lb

## 2014-01-18 DIAGNOSIS — I10 Essential (primary) hypertension: Secondary | ICD-10-CM | POA: Diagnosis not present

## 2014-01-18 DIAGNOSIS — L989 Disorder of the skin and subcutaneous tissue, unspecified: Secondary | ICD-10-CM | POA: Diagnosis not present

## 2014-01-18 DIAGNOSIS — E1141 Type 2 diabetes mellitus with diabetic mononeuropathy: Secondary | ICD-10-CM

## 2014-01-18 DIAGNOSIS — N182 Chronic kidney disease, stage 2 (mild): Secondary | ICD-10-CM

## 2014-01-18 DIAGNOSIS — R238 Other skin changes: Secondary | ICD-10-CM

## 2014-01-18 NOTE — Patient Instructions (Signed)
Lidex twice a day  Continue current medications FLu shot at pharmacy Check on Prevnar 13  Check on shingles vaccine  F/U 3 months

## 2014-01-19 ENCOUNTER — Encounter: Payer: Self-pay | Admitting: Family Medicine

## 2014-01-19 DIAGNOSIS — R238 Other skin changes: Secondary | ICD-10-CM | POA: Insufficient documentation

## 2014-01-19 NOTE — Progress Notes (Signed)
Patient ID: Julie Clay, female   DOB: 08-Apr-1941, 73 y.o.   MRN: [id number]   Subjective:    Patient ID: Julie Clay, female    DOB: 09/09/1940, 73 y.o.   MRN: [id number]  Patient presents for 4 mos f/u DM- did not bring meter, fasting CBG < 150, no hypoglycemia, a1c 7.1% which is stable for her, lipids at goal, TG improved some with krill oil  Rectal pain- had rectocele surgery has a sore spot and feels a small bump to left of rectum, feels irritated no drainage, no blood in stool no vaginal discharge or bleeding  Meds reviewed     Review Of Systems:  GEN- denies fatigue, fever, weight loss,weakness, recent illness HEENT- denies eye drainage, change in vision, nasal discharge, CVS- denies chest pain, palpitations RESP- denies SOB, cough, wheeze ABD- denies N/V, change in stools, abd pain GU- denies dysuria, hematuria, dribbling, incontinence MSK- +joint pain, muscle aches, injury Neuro- denies headache, dizziness, syncope, seizure activity       Objective:    BP 110/60  Pulse 78  Temp(Src) 98.3 F (36.8 C) (Oral)  Resp 20  Ht 5\' 5"  (1.651 m)  Wt 252 lb (114.306 kg)  BMI 41.93 kg/m2 GEN- NAD, alert and oriented x3 HEENT- PERRL, EOMI, non injected sclera, pink conjunctiva, MMM, oropharynx clear CVS- RRR, no murmur RESP-CTAB Rectum- normal external apperance, small skin tag, mild erythema and mild tenderness 1cm to left of rectum, no gross lesions, no bleeding, no palpable sutures EXT- No edema Pulses- Radial, DP- 2+        Assessment & Plan:      Problem List Items Addressed This Visit   None      Note: This dictation was prepared with Dragon dictation along with smaller phrase technology. Any transcriptional errors that result from this process are unintentional.

## 2014-01-19 NOTE — Assessment & Plan Note (Signed)
A1C at goal for age

## 2014-01-19 NOTE — Assessment & Plan Note (Signed)
Well controlled 

## 2014-01-19 NOTE — Assessment & Plan Note (Signed)
Skin irritation noted, no specific lesion, she has some lidex at home will apply twice a day

## 2014-02-13 LAB — HM DIABETES EYE EXAM

## 2014-02-14 ENCOUNTER — Other Ambulatory Visit: Payer: Self-pay | Admitting: Family Medicine

## 2014-02-14 NOTE — Telephone Encounter (Signed)
Refill appropriate and filled per protocol. 

## 2014-02-15 ENCOUNTER — Other Ambulatory Visit (HOSPITAL_COMMUNITY): Payer: Self-pay | Admitting: Oncology

## 2014-02-15 DIAGNOSIS — C50912 Malignant neoplasm of unspecified site of left female breast: Secondary | ICD-10-CM

## 2014-02-15 MED ORDER — TAMOXIFEN CITRATE 20 MG PO TABS: 20.0000 mg | ORAL_TABLET | Freq: Every day | ORAL | Status: DC

## 2014-02-18 ENCOUNTER — Encounter: Payer: Self-pay | Admitting: Family Medicine

## 2014-03-16 ENCOUNTER — Other Ambulatory Visit: Payer: Self-pay | Admitting: Family Medicine

## 2014-03-16 NOTE — Progress Notes (Signed)
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  INTERVAL HISTORY: Julie Clay 73 y.o. 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  Started on Arimidex in Feb or March 2013 but developed intolerance to this medication.  She was therefore switched to Tamoxifen in Jan 2014.  She will take anti-endocrine therapy for 5 years.  She declined radiation therapy at the time of presentation in December 2012.     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  D/C'd due to arthralgias and myalgias   04/19/2012 -  Chemotherapy Tamoxifen started   I personally reviewed and went over laboratory results with the patient.  The results are noted within this dictation.  She notes diffuse muscle cramping and not arthralgias and myalgias.  This is not Tamoxifen-induced.  She notes that pickle juice resolves the cramping. I will defer this to her primary care provider.   She requests an Rx for a new left arm compression sleeve for her lymphedema.  She notes that it has been 1 year and she would like a new one.  I provided her an rx for this. She asks if this will be a long-term issue for her and she is educated regarding post-op lymphedema.  She is educated that this will likely be a life long issue.  Fortunately, it is well controlled.   Oncologically, she denies any complaints and ROS questioning is  negative.  Past Medical History  Diagnosis Date  . Hypertension   . Hyperlipidemia   . Diabetes mellitus   . Asthma     spirometry (2011)- mild ventilary defect  . Cancer 2012    Breast  . Arthritis     Osteoarthritis  . Fatty liver 2013    enlarged  . Sliding hiatal hernia   . Ventral hernia, unspecified, without mention of obstruction or gangrene   . Lung nodule seen on imaging study 2013  . HSV (herpes simplex virus) infection   . Lymphedema     Left arm  . 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 Calcification of left breast   . Arthritis   . Shortness of breath     with exertion  . Sleep apnea     Stop Bang score of 5. Pt has had sleep study, but was shown to be negative for sleep apnea.  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; Essential hypertension, benign; DM (diabetes mellitus); OA (osteoarthritis); Morbidly obese; Asthma; Hyperlipidemia; Chronic fatigue; OSA (obstructive sleep apnea); Dyspnea; HSV (herpes simplex virus) infection; Peripheral edema; Pain in joint, shoulder region; Rotator cuff syndrome of left shoulder; Muscle weakness (generalized); Occupational therapy encounter; Hip pain; Insomnia; Pain, joint, multiple sites; CKD (chronic kidney disease), stage II; Diabetic neuropathy; Pre-ulcerative calluses; Pelvic pain; Bladder prolapse, female, acquired; Endometrial polyp; Rectocele; Atypical ductal hyperplasia of left breast; Palpitations; Post-operative state; and Skin irritation on her problem list.  is allergic to arimidex; adhesive; lisinopril; and tetracyclines & related.  Ms. Osgood does not currently have medications on file.  Past Surgical History  Procedure Laterality Date  . Cesarean section    . 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 Breast surgery  jan 2013  . Hand ligament reconstruction Right   . Cataract extraction, bilateral      on different  occassions   . Shoulder arthroscopy with open rotator cuff repair Left 07/26/2012  . Breast biopsy  06/22/13  . Breast biopsy Left 09/12/2013    Procedure: BREAST BIOPSY WITH NEEDLE LOCALIZATION;  Surgeon: Jamesetta So, MD;  Location: [facility];  Service: General;  Laterality: Left;  . Hysteroscopy w/d&c N/A 11/06/2013    Procedure: DILATATION AND CURETTAGE /HYSTEROSCOPY;  Surgeon: Jonnie Kind, MD;  Location: [facility];  Service: Gynecology;  Laterality: N/A;  . Polypectomy N/A 11/06/2013    Procedure: POLYPECTOMY (REMOVAL ENDOMETRIAL POLYP);  Surgeon: Jonnie Kind, MD;  Location: [facility];  Service: Gynecology;  Laterality: N/A;  . Rectocele repair N/A 11/06/2013    Procedure: POSTERIOR REPAIR (RECTOCELE);  Surgeon: Jonnie Kind, MD;  Location: [facility];  Service: Gynecology;  Laterality: N/A;    Denies any headaches, dizziness, double vision, fevers, chills, night sweats, nausea, vomiting, diarrhea, constipation, chest pain, heart palpitations, shortness of breath, blood in stool, black tarry stool, urinary pain, urinary burning, urinary frequency, hematuria.   PHYSICAL EXAMINATION  ECOG PERFORMANCE STATUS: 1 - Symptomatic but completely ambulatory  Filed Vitals:   03/19/14 1100  BP: 133/60  Pulse: 94  Temp: 97.5 F (36.4 C)  Resp: 20    GENERAL:alert, no distress, well nourished, well developed, comfortable, cooperative, obese and smiling SKIN: skin color, texture, turgor are normal, no rashes or significant lesions HEAD: Normocephalic, No masses, lesions, tenderness or abnormalities EYES: normal, PERRLA, EOMI, Conjunctiva are pink and non-injected EARS: External ears normal OROPHARYNX:mucous membranes are moist  NECK: supple, no adenopathy, thyroid normal size, non-tender, without nodularity, no stridor, non-tender, trachea midline LYMPH:  no palpable lymphadenopathy BREAST:patient declines to have breast exam LUNGS: clear to auscultation and percussion HEART: regular rate &  rhythm, no murmurs, no gallops, S1 normal and S2 normal ABDOMEN:obese and normal bowel sounds BACK: Back symmetric, no curvature. EXTREMITIES:less then 2 second capillary refill, no joint deformities, effusion, or inflammation, no skin discoloration, no cyanosis  NEURO: alert & oriented x 3 with fluent speech, no focal motor/sensory deficits, gait normal   LABORATORY DATA: CBC    Component Value Date/Time   WBC 8.2 03/19/2014 1143   RBC 4.61 03/19/2014 1143   HGB 12.6 03/19/2014 1143   HCT 38.2 03/19/2014 1143   PLT 220 03/19/2014 1143   MCV 82.9 03/19/2014 1143   MCH 27.3 03/19/2014 1143   MCHC 33.0 03/19/2014 1143   RDW 14.0 03/19/2014 1143   LYMPHSABS 1.8 03/19/2014 1143   MONOABS 0.5 03/19/2014 1143   EOSABS 0.1 03/19/2014 1143   BASOSABS 0.0 03/19/2014 1143      Chemistry      Component Value Date/Time   NA 143 10/31/2013 1110   K 4.2 10/31/2013 1110   CL 102 10/31/2013 1110   CO2 26 10/31/2013 1110   BUN 22 10/31/2013 1110   CREATININE 0.96 10/31/2013 1110   CREATININE 1.09 07/13/2013 0918      Component Value Date/Time   CALCIUM 9.7 10/31/2013 1110   ALKPHOS 37* 01/16/2014 0857   AST 38* 01/16/2014 0857   ALT  43* 01/16/2014 0857   BILITOT 0.4 01/16/2014 0857        ASSESSMENT:  1. Left-sided stage I cancer the breast infiltrating ductal type ER positive, PR positive, HER-2/neu nonamplified with an Oncotype DX score 0.  Started on Arimidex in Feb or March 2013 but developed intolerance to this medication.  She was therefore switched to Tamoxifen in Jan 2014.  She will take anti-endocrine therapy for 5 years.  She declined radiation therapy at the time of presentation in December 2012.  2.  Left breast atypical lobular hyperplasia, S/P lumpectomy by Dr. Arnoldo Morale on 09/12/2013 3. Left full-thickness supraspinatus tear with mild partial-thickness superior infraspinatus tear requiring arthroscopic intervention in April 2014  4. Multiple left breast biopsies (March  2015 and May 2015).Negative work-up. 5. Diffuse muscle cramping, not Tamoxifen-induced.   Patient Active Problem List   Diagnosis Date Noted  . Skin irritation 01/19/2014  . Post-operative state 11/14/2013  . Palpitations 10/12/2013  . Atypical ductal hyperplasia of left breast 07/12/2013  . Endometrial polyp 06/25/2013  . Rectocele 06/25/2013  . Diabetic neuropathy 05/09/2013  . Pre-ulcerative calluses 05/09/2013  . Pelvic pain 05/09/2013  . Bladder prolapse, female, acquired 05/09/2013  . Pain, joint, multiple sites 03/26/2013  . 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 Insomnia 11/29/2012  . Hip pain 09/08/2012  . Occupational therapy encounter 08/30/2012  . Muscle weakness (generalized) 06/26/2012  . Rotator cuff syndrome of left shoulder 06/21/2012  . Pain in joint, shoulder region 06/16/2012  . Peripheral edema 02/04/2012  . HSV (herpes simplex virus) infection 12/09/2011  . Dyspnea 11/15/2011  . 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 Essential hypertension, benign 10/11/2011  . DM (diabetes mellitus) 10/11/2011  . OA (osteoarthritis) 10/11/2011  . Morbidly obese 10/11/2011  . Asthma 10/11/2011  . Hyperlipidemia 10/11/2011  . Chronic fatigue 10/11/2011  . OSA (obstructive sleep apnea) 10/11/2011     PLAN:  1. I personally reviewed and went over laboratory results with the patient.  The results are noted within this dictation. 2. Continue Tamoxifen daily 3. Labs today and in 6 months: CBC diff, CMET  4. Continue follow-up with primary care provider 5. Rx for left arm compression sleeve 6. Patient education regarding lymphedema 7. Return in 6 months for follow-up   THERAPY PLAN:  NCCN guidelines recommends the following surveillance for invasive breast cancer:  A. History and Physical exam every 4-6 months for 5 years and then every 12 months.  B. Mammography every 12 months  C. Women on Tamoxifen: annual gynecologic  assessment every 12 months if uterus is present.  D. Women on aromatase inhibitor or who experience ovarian failure secondary to treatment should have monitoring of bone health with a bone mineral density determination at baseline and periodically thereafter.  E. Assess and encourage adherence to adjuvant endocrine therapy.  F. Evidence suggests that active lifestyle and achieving and maintaining an ideal body weight (20-25 BMI) may lead to optimal breast cancer outcomes.   All questions were answered. The patient knows to call the clinic with any problems, questions or concerns. We can certainly see the patient much sooner if necessary.  Patient and plan discussed with Dr. Farrel Gobble and he is in agreement with the aforementioned.   Sahej Schrieber 03/19/2014

## 2014-03-19 ENCOUNTER — Encounter (HOSPITAL_COMMUNITY): Payer: Self-pay | Admitting: Oncology

## 2014-03-19 ENCOUNTER — Encounter (HOSPITAL_COMMUNITY): Payer: Medicare Other | Attending: Hematology and Oncology

## 2014-03-19 ENCOUNTER — Encounter (HOSPITAL_COMMUNITY): Payer: Medicare Other | Attending: Oncology | Admitting: Oncology

## 2014-03-19 VITALS — BP 133/60 | HR 94 | Temp 97.5°F | Resp 20 | Wt 251.4 lb

## 2014-03-19 DIAGNOSIS — E669 Obesity, unspecified: Secondary | ICD-10-CM | POA: Insufficient documentation

## 2014-03-19 DIAGNOSIS — E1140 Type 2 diabetes mellitus with diabetic neuropathy, unspecified: Secondary | ICD-10-CM | POA: Diagnosis not present

## 2014-03-19 DIAGNOSIS — N62 Hypertrophy of breast: Secondary | ICD-10-CM

## 2014-03-19 DIAGNOSIS — Z902 Acquired absence of lung [part of]: Secondary | ICD-10-CM | POA: Insufficient documentation

## 2014-03-19 DIAGNOSIS — E785 Hyperlipidemia, unspecified: Secondary | ICD-10-CM | POA: Insufficient documentation

## 2014-03-19 DIAGNOSIS — I129 Hypertensive chronic kidney disease with stage 1 through stage 4 chronic kidney disease, or unspecified chronic kidney disease: Secondary | ICD-10-CM | POA: Insufficient documentation

## 2014-03-19 DIAGNOSIS — Z170 Estrogen receptor positive status [ER+]: Secondary | ICD-10-CM | POA: Insufficient documentation

## 2014-03-19 DIAGNOSIS — I890 Lymphedema, not elsewhere classified: Secondary | ICD-10-CM | POA: Insufficient documentation

## 2014-03-19 DIAGNOSIS — N6092 Unspecified benign mammary dysplasia of left breast: Secondary | ICD-10-CM | POA: Diagnosis not present

## 2014-03-19 DIAGNOSIS — N182 Chronic kidney disease, stage 2 (mild): Secondary | ICD-10-CM | POA: Insufficient documentation

## 2014-03-19 DIAGNOSIS — J45909 Unspecified asthma, uncomplicated: Secondary | ICD-10-CM | POA: Diagnosis not present

## 2014-03-19 DIAGNOSIS — C50912 Malignant neoplasm of unspecified site of left female breast: Secondary | ICD-10-CM | POA: Insufficient documentation

## 2014-03-19 DIAGNOSIS — R252 Cramp and spasm: Secondary | ICD-10-CM | POA: Insufficient documentation

## 2014-03-19 DIAGNOSIS — Z9221 Personal history of antineoplastic chemotherapy: Secondary | ICD-10-CM | POA: Insufficient documentation

## 2014-03-19 DIAGNOSIS — Z79899 Other long term (current) drug therapy: Secondary | ICD-10-CM | POA: Insufficient documentation

## 2014-03-19 LAB — CBC WITH DIFFERENTIAL/PLATELET
Basophils Absolute: 0 10*3/uL (ref 0.0–0.1)
Basophils Relative: 0 % (ref 0–1)
EOS PCT: 2 % (ref 0–5)
Eosinophils Absolute: 0.1 10*3/uL (ref 0.0–0.7)
HEMATOCRIT: 38.2 % (ref 36.0–46.0)
HEMOGLOBIN: 12.6 g/dL (ref 12.0–15.0)
Lymphocytes Relative: 22 % (ref 12–46)
Lymphs Abs: 1.8 10*3/uL (ref 0.7–4.0)
MCH: 27.3 pg (ref 26.0–34.0)
MCHC: 33 g/dL (ref 30.0–36.0)
MCV: 82.9 fL (ref 78.0–100.0)
MONO ABS: 0.5 10*3/uL (ref 0.1–1.0)
MONOS PCT: 6 % (ref 3–12)
NEUTROS ABS: 5.7 10*3/uL (ref 1.7–7.7)
Neutrophils Relative %: 70 % (ref 43–77)
Platelets: 220 10*3/uL (ref 150–400)
RBC: 4.61 MIL/uL (ref 3.87–5.11)
RDW: 14 % (ref 11.5–15.5)
WBC: 8.2 10*3/uL (ref 4.0–10.5)

## 2014-03-19 LAB — COMPREHENSIVE METABOLIC PANEL
ALT: 40 U/L — ABNORMAL HIGH (ref 0–35)
ANION GAP: 15 (ref 5–15)
AST: 36 U/L (ref 0–37)
Albumin: 3.3 g/dL — ABNORMAL LOW (ref 3.5–5.2)
Alkaline Phosphatase: 48 U/L (ref 39–117)
BILIRUBIN TOTAL: 0.2 mg/dL — AB (ref 0.3–1.2)
BUN: 23 mg/dL (ref 6–23)
CALCIUM: 9.3 mg/dL (ref 8.4–10.5)
CO2: 24 meq/L (ref 19–32)
CREATININE: 1.07 mg/dL (ref 0.50–1.10)
Chloride: 101 mEq/L (ref 96–112)
GFR, EST AFRICAN AMERICAN: 59 mL/min — AB (ref >90)
GFR, EST NON AFRICAN AMERICAN: 51 mL/min — AB (ref >90)
Glucose, Bld: 282 mg/dL — ABNORMAL HIGH (ref 70–99)
Potassium: 3.7 mEq/L (ref 3.7–5.3)
Sodium: 140 mEq/L (ref 137–147)
Total Protein: 6.7 g/dL (ref 6.0–8.3)

## 2014-03-19 NOTE — Progress Notes (Signed)
Labs for cbcd,cmp

## 2014-03-19 NOTE — Telephone Encounter (Signed)
Refill appropriate and filled per protocol. 

## 2014-03-19 NOTE — Patient Instructions (Signed)
[facility] Discharge Instructions  RECOMMENDATIONS MADE BY THE CONSULTANT AND ANY TEST RESULTS WILL BE SENT TO YOUR REFERRING PHYSICIAN.  I reviewed your lab work with you. I provided an Rx for a new compression sleeve. Some of your labs are pending at this time.  We will update you with those results in the near future, today or tomorrow.  Labs in 6 months. Continue Tamoxifen daily. Return in 6 months for follow-up. Mammogram is due in Feb 2016.   CBC    Component Value Date/Time   WBC 8.2 03/19/2014 1143   RBC 4.61 03/19/2014 1143   HGB 12.6 03/19/2014 1143   HCT 38.2 03/19/2014 1143   PLT 220 03/19/2014 1143   MCV 82.9 03/19/2014 1143   MCH 27.3 03/19/2014 1143   MCHC 33.0 03/19/2014 1143   RDW 14.0 03/19/2014 1143   LYMPHSABS 1.8 03/19/2014 1143   MONOABS 0.5 03/19/2014 1143   EOSABS 0.1 03/19/2014 1143   BASOSABS 0.0 03/19/2014 1143     Thank you for choosing [facility] to provide your oncology and hematology care.  To afford each patient quality time with our providers, please arrive at least 15 minutes before your scheduled appointment time.  With your help, our goal is to use those 15 minutes to complete the necessary work-up to ensure our physicians have the information they need to help with your evaluation and healthcare recommendations.    Effective January 1st, 2014, we ask that you re-schedule your appointment with our physicians should you arrive 10 or more minutes late for your appointment.  We strive to give you quality time with our providers, and arriving late affects you and other patients whose appointments are after yours.    Again, thank you for choosing [facility].  Our hope is that these requests will decrease the amount of time that you wait before being seen by our physicians.       _____________________________________________________________  Should you have questions after your visit to [facility], please contact our office at [phone] between the hours of 8:30 a.m. and 5:00 p.m.  Voicemails left after 4:30 p.m. will not be returned until the following business day.  For prescription refill requests, have your pharmacy contact our office with your prescription refill request.

## 2014-03-28 DIAGNOSIS — M5136 Other intervertebral disc degeneration, lumbar region: Secondary | ICD-10-CM | POA: Diagnosis not present

## 2014-03-28 DIAGNOSIS — M9903 Segmental and somatic dysfunction of lumbar region: Secondary | ICD-10-CM | POA: Diagnosis not present

## 2014-04-05 DIAGNOSIS — E119 Type 2 diabetes mellitus without complications: Secondary | ICD-10-CM | POA: Diagnosis not present

## 2014-04-09 ENCOUNTER — Encounter: Payer: Self-pay | Admitting: Family Medicine

## 2014-04-16 ENCOUNTER — Other Ambulatory Visit: Payer: Self-pay | Admitting: Family Medicine

## 2014-04-22 ENCOUNTER — Other Ambulatory Visit: Payer: Self-pay | Admitting: Family Medicine

## 2014-04-22 ENCOUNTER — Ambulatory Visit: Payer: Medicare Other | Admitting: Family Medicine

## 2014-04-22 DIAGNOSIS — E785 Hyperlipidemia, unspecified: Secondary | ICD-10-CM

## 2014-04-22 DIAGNOSIS — E119 Type 2 diabetes mellitus without complications: Secondary | ICD-10-CM | POA: Diagnosis not present

## 2014-04-22 DIAGNOSIS — N189 Chronic kidney disease, unspecified: Secondary | ICD-10-CM

## 2014-04-22 DIAGNOSIS — C50912 Malignant neoplasm of unspecified site of left female breast: Secondary | ICD-10-CM

## 2014-04-22 LAB — LIPID PANEL
CHOLESTEROL: 135 mg/dL (ref 0–200)
HDL: 48 mg/dL (ref >39)
LDL Cholesterol: 55 mg/dL (ref 0–99)
Total CHOL/HDL Ratio: 2.8 Ratio
Triglycerides: 159 mg/dL — ABNORMAL HIGH (ref <150)
VLDL: 32 mg/dL (ref 0–40)

## 2014-04-22 LAB — HEPATIC FUNCTION PANEL
ALBUMIN: 3.6 g/dL (ref 3.5–5.2)
ALT: 46 U/L — AB (ref 0–35)
AST: 39 U/L — AB (ref 0–37)
Alkaline Phosphatase: 40 U/L (ref 39–117)
Bilirubin, Direct: 0.1 mg/dL (ref 0.0–0.3)
Indirect Bilirubin: 0.3 mg/dL (ref 0.2–1.2)
TOTAL PROTEIN: 6.1 g/dL (ref 6.0–8.3)
Total Bilirubin: 0.4 mg/dL (ref 0.2–1.2)

## 2014-04-23 LAB — HEMOGLOBIN A1C
Hgb A1c MFr Bld: 7.5 % — ABNORMAL HIGH (ref <5.7)
Mean Plasma Glucose: 169 mg/dL — ABNORMAL HIGH (ref <117)

## 2014-04-26 ENCOUNTER — Ambulatory Visit (INDEPENDENT_AMBULATORY_CARE_PROVIDER_SITE_OTHER): Payer: Medicare Other | Admitting: Family Medicine

## 2014-04-26 ENCOUNTER — Encounter: Payer: Self-pay | Admitting: Family Medicine

## 2014-04-26 VITALS — BP 136/74 | HR 78 | Temp 98.7°F | Resp 16 | Ht 65.0 in | Wt 249.0 lb

## 2014-04-26 DIAGNOSIS — R945 Abnormal results of liver function studies: Secondary | ICD-10-CM

## 2014-04-26 DIAGNOSIS — M8949 Other hypertrophic osteoarthropathy, multiple sites: Secondary | ICD-10-CM

## 2014-04-26 DIAGNOSIS — E785 Hyperlipidemia, unspecified: Secondary | ICD-10-CM

## 2014-04-26 DIAGNOSIS — R7989 Other specified abnormal findings of blood chemistry: Secondary | ICD-10-CM

## 2014-04-26 DIAGNOSIS — N182 Chronic kidney disease, stage 2 (mild): Secondary | ICD-10-CM

## 2014-04-26 DIAGNOSIS — M159 Polyosteoarthritis, unspecified: Secondary | ICD-10-CM

## 2014-04-26 DIAGNOSIS — M150 Primary generalized (osteo)arthritis: Secondary | ICD-10-CM

## 2014-04-26 DIAGNOSIS — E1141 Type 2 diabetes mellitus with diabetic mononeuropathy: Secondary | ICD-10-CM

## 2014-04-26 DIAGNOSIS — I10 Essential (primary) hypertension: Secondary | ICD-10-CM

## 2014-04-26 DIAGNOSIS — L84 Corns and callosities: Secondary | ICD-10-CM | POA: Diagnosis not present

## 2014-04-26 NOTE — Patient Instructions (Signed)
Continue current medications  Prevnar 13 shot given  Work on diabetes  Weight 5lbs  F/U 3 months

## 2014-04-28 DIAGNOSIS — R7989 Other specified abnormal findings of blood chemistry: Secondary | ICD-10-CM | POA: Insufficient documentation

## 2014-04-28 DIAGNOSIS — R945 Abnormal results of liver function studies: Secondary | ICD-10-CM

## 2014-04-28 NOTE — Assessment & Plan Note (Signed)
Mild elevation related to lipids, obesity

## 2014-04-28 NOTE — Assessment & Plan Note (Signed)
Dietary change discussed, goal is 5lbs down by next visit

## 2014-04-28 NOTE — Assessment & Plan Note (Signed)
Goal is A1C < 7.5% she has increased, due to dietary changes, with age and other comorbidites, discussed if CBG continue to increase she will need injectable

## 2014-04-28 NOTE — Assessment & Plan Note (Addendum)
Well controlled, no change to meds prevnar 13 next visit, recent shingles vaccination

## 2014-04-28 NOTE — Progress Notes (Signed)
Patient ID: Julie Clay, female   DOB: October 20, 1940, 74 y.o.   MRN: [id number]   Subjective:    Patient ID: Julie Clay, female    DOB: [birth year], 74 y.o.   MRN: [id number]  Patient presents for 3 month F/U   Pt here to f/u chronic medical problems  DM- A1C 7.5%, taking meds, not watching her diet, CBG , 200 fasting, no hypoglycemia, had eye visit with Dr. Jorja Loa, needs new diabetic shoes,declines returning to podiatry   TG- improved, taking Krill oil and statin drug   Had recent shingles vaccine a few months ago   Meds reviewed, no new concerns   Review Of Systems:  GEN- denies fatigue, fever, weight loss,weakness, recent illness HEENT- denies eye drainage, change in vision, nasal discharge, CVS- denies chest pain, palpitations RESP- denies SOB, cough, wheeze ABD- denies N/V, change in stools, abd pain GU- denies dysuria, hematuria, dribbling, incontinence MSK- + joint pain, muscle aches, injury Neuro- denies headache, dizziness, syncope, seizure activity       Objective:    BP 136/74 mmHg  Pulse 78  Temp(Src) 98.7 F (37.1 C) (Oral)  Resp 16  Ht 5\' 5"  (1.651 m)  Wt 249 lb (112.946 kg)  BMI 41.44 kg/m2 GEN- NAD, alert and oriented x3,obese HEENT- PERRL, EOMI, non injected sclera, pink conjunctiva, MMM, oropharynx clear CVS- RRR, no murmur RESP-CTAB EXT- No edema Pulses- Radial, DP- 2+        Assessment & Plan:      Problem List Items Addressed This Visit      Unprioritized   Pre-ulcerative calluses   OA (osteoarthritis)   Morbidly obese    Dietary change discussed, goal is 5lbs down by next visit    Hyperlipidemia    TG improved, mild elevation of LFT I think this is secondary to fatty liver    Essential hypertension, benign    Well controlled, no change to meds prevnar 13 next visit, recent shingles vaccination    Elevated LFTs    Mild elevation related to lipids, obesity    DM (diabetes mellitus) - Primary    Goal is A1C < 7.5% she has  increased, due to dietary changes, with age and other comorbidites, discussed if CBG continue to increase she will need injectable    Relevant Orders      [facility] DIABETES FOOT EXAM (Completed)   Diabetic neuropathy     New orthotics to be ordered Better glucose control    CKD (chronic kidney disease), stage II      Note: This dictation was prepared with Dragon dictation along with smaller phrase technology. Any transcriptional errors that result from this process are unintentional.

## 2014-04-28 NOTE — Assessment & Plan Note (Signed)
TG improved, mild elevation of LFT I think this is secondary to fatty liver

## 2014-04-28 NOTE — Assessment & Plan Note (Signed)
New orthotics to be ordered Better glucose control

## 2014-05-01 NOTE — Progress Notes (Signed)
Addendum to foot eam- bilat callus, no ulcerations seen

## 2014-06-06 ENCOUNTER — Other Ambulatory Visit (HOSPITAL_COMMUNITY): Payer: Self-pay | Admitting: Hematology & Oncology

## 2014-06-06 DIAGNOSIS — Z09 Encounter for follow-up examination after completed treatment for conditions other than malignant neoplasm: Secondary | ICD-10-CM

## 2014-06-11 ENCOUNTER — Other Ambulatory Visit: Payer: Self-pay | Admitting: Family Medicine

## 2014-06-11 NOTE — Telephone Encounter (Signed)
Refill appropriate and filled per protocol. 

## 2014-06-18 ENCOUNTER — Ambulatory Visit (HOSPITAL_COMMUNITY)
Admission: RE | Admit: 2014-06-18 | Discharge: 2014-06-18 | Disposition: A | Discharge status: Home / Self Care | Payer: Medicare Other | Source: Ambulatory Visit | Attending: Hematology & Oncology | Admitting: Hematology & Oncology

## 2014-06-18 DIAGNOSIS — R928 Other abnormal and inconclusive findings on diagnostic imaging of breast: Secondary | ICD-10-CM | POA: Diagnosis not present

## 2014-06-18 DIAGNOSIS — Z853 Personal history of malignant neoplasm of breast: Secondary | ICD-10-CM | POA: Diagnosis not present

## 2014-06-18 DIAGNOSIS — Z09 Encounter for follow-up examination after completed treatment for conditions other than malignant neoplasm: Secondary | ICD-10-CM

## 2014-06-26 ENCOUNTER — Other Ambulatory Visit: Payer: Self-pay | Admitting: Family Medicine

## 2014-06-26 NOTE — Telephone Encounter (Signed)
Refill appropriate and filled per protocol. 

## 2014-06-30 ENCOUNTER — Other Ambulatory Visit: Payer: Self-pay | Admitting: Family Medicine

## 2014-07-01 NOTE — Telephone Encounter (Signed)
Medication refilled per protocol. 

## 2014-07-06 ENCOUNTER — Emergency Department (HOSPITAL_COMMUNITY)
Admission: EM | Admit: 2014-07-06 | Discharge: 2014-07-06 | Disposition: A | Discharge status: Home / Self Care | Payer: Medicare Other | Attending: Emergency Medicine | Admitting: Emergency Medicine

## 2014-07-06 ENCOUNTER — Emergency Department (HOSPITAL_COMMUNITY): Payer: Medicare Other

## 2014-07-06 ENCOUNTER — Encounter (HOSPITAL_COMMUNITY): Payer: Self-pay | Admitting: Cardiology

## 2014-07-06 DIAGNOSIS — Z853 Personal history of malignant neoplasm of breast: Secondary | ICD-10-CM | POA: Insufficient documentation

## 2014-07-06 DIAGNOSIS — Y998 Other external cause status: Secondary | ICD-10-CM | POA: Insufficient documentation

## 2014-07-06 DIAGNOSIS — W1782XA Fall from (out of) grocery cart, initial encounter: Secondary | ICD-10-CM | POA: Diagnosis not present

## 2014-07-06 DIAGNOSIS — Z8619 Personal history of other infectious and parasitic diseases: Secondary | ICD-10-CM | POA: Diagnosis not present

## 2014-07-06 DIAGNOSIS — S8991XA Unspecified injury of right lower leg, initial encounter: Secondary | ICD-10-CM | POA: Diagnosis not present

## 2014-07-06 DIAGNOSIS — Z79899 Other long term (current) drug therapy: Secondary | ICD-10-CM | POA: Insufficient documentation

## 2014-07-06 DIAGNOSIS — M1990 Unspecified osteoarthritis, unspecified site: Secondary | ICD-10-CM | POA: Insufficient documentation

## 2014-07-06 DIAGNOSIS — Z8669 Personal history of other diseases of the nervous system and sense organs: Secondary | ICD-10-CM | POA: Insufficient documentation

## 2014-07-06 DIAGNOSIS — Y9389 Activity, other specified: Secondary | ICD-10-CM | POA: Insufficient documentation

## 2014-07-06 DIAGNOSIS — J45909 Unspecified asthma, uncomplicated: Secondary | ICD-10-CM | POA: Diagnosis not present

## 2014-07-06 DIAGNOSIS — E785 Hyperlipidemia, unspecified: Secondary | ICD-10-CM | POA: Insufficient documentation

## 2014-07-06 DIAGNOSIS — S63619A Unspecified sprain of unspecified finger, initial encounter: Secondary | ICD-10-CM

## 2014-07-06 DIAGNOSIS — S8992XA Unspecified injury of left lower leg, initial encounter: Secondary | ICD-10-CM | POA: Diagnosis not present

## 2014-07-06 DIAGNOSIS — Y9289 Other specified places as the place of occurrence of the external cause: Secondary | ICD-10-CM | POA: Insufficient documentation

## 2014-07-06 DIAGNOSIS — S6992XA Unspecified injury of left wrist, hand and finger(s), initial encounter: Secondary | ICD-10-CM | POA: Diagnosis not present

## 2014-07-06 DIAGNOSIS — I10 Essential (primary) hypertension: Secondary | ICD-10-CM | POA: Diagnosis not present

## 2014-07-06 DIAGNOSIS — S46912A Strain of unspecified muscle, fascia and tendon at shoulder and upper arm level, left arm, initial encounter: Secondary | ICD-10-CM | POA: Diagnosis not present

## 2014-07-06 DIAGNOSIS — Z8719 Personal history of other diseases of the digestive system: Secondary | ICD-10-CM | POA: Diagnosis not present

## 2014-07-06 DIAGNOSIS — S63613A Unspecified sprain of left middle finger, initial encounter: Secondary | ICD-10-CM | POA: Insufficient documentation

## 2014-07-06 DIAGNOSIS — E119 Type 2 diabetes mellitus without complications: Secondary | ICD-10-CM | POA: Diagnosis not present

## 2014-07-06 DIAGNOSIS — M25512 Pain in left shoulder: Secondary | ICD-10-CM | POA: Diagnosis not present

## 2014-07-06 DIAGNOSIS — S4992XA Unspecified injury of left shoulder and upper arm, initial encounter: Secondary | ICD-10-CM | POA: Diagnosis not present

## 2014-07-06 DIAGNOSIS — M79642 Pain in left hand: Secondary | ICD-10-CM | POA: Diagnosis not present

## 2014-07-06 MED ORDER — HYDROCODONE-ACETAMINOPHEN 5-325 MG PO TABS: 1.0000 | ORAL_TABLET | ORAL | Status: DC | PRN

## 2014-07-06 NOTE — ED Provider Notes (Signed)
CSN: [id number]     Arrival date & time 07/06/14  1506 History   First MD Initiated Contact with Patient 07/06/14 1608     Chief Complaint  Patient presents with  . Fall     (Consider location/radiation/quality/duration/timing/severity/associated sxs/prior Treatment) HPI Comments: Patient is a 74 year old female who states that earlier today she was putting soil in a grocery cart, when she fell and injured the left middle finger, the left shoulder, and both knees. She denies any loss of consciousness. There is no difficulty with breathing. Patient is not on any anticoagulation medications. She presents at this time for evaluation concerning her left middle finger and her left shoulder in particular.  Patient is a 74 y.o. female presenting with fall. The history is provided by the patient.  Fall This is a new problem. The current episode started today. Associated symptoms include arthralgias.    Past Medical History  Diagnosis Date  . Hypertension   . Hyperlipidemia   . Diabetes mellitus   . Asthma     spirometry (2011)- mild ventilary defect  . Cancer 2012    Breast  . Arthritis     Osteoarthritis  . Fatty liver 2013    enlarged  . Sliding hiatal hernia   . Ventral hernia, unspecified, without mention of obstruction or gangrene   . Lung nodule seen on imaging study 2013  . HSV (herpes simplex virus) infection   . Lymphedema     Left arm  . 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 Calcification of left breast   . Arthritis   . Shortness of breath     with exertion  . Sleep apnea     Stop Bang score of 5. Pt has had sleep study, but was shown to be negative for sleep apnea.  Marland Kitchen Dysrhythmia     palpatations   Past Surgical History  Procedure Laterality Date  . Cesarean section    . 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 Breast surgery  jan 2013  . Hand ligament reconstruction Right   . Cataract extraction, bilateral      on different  occassions   . Shoulder arthroscopy with open rotator cuff repair Left 07/26/2012  . Breast biopsy  06/22/13  . Breast biopsy Left 09/12/2013    Procedure: BREAST BIOPSY WITH NEEDLE LOCALIZATION;  Surgeon: Jamesetta So, MD;  Location: [facility];  Service: General;  Laterality: Left;  . Hysteroscopy w/d&c N/A 11/06/2013    Procedure: DILATATION AND CURETTAGE /HYSTEROSCOPY;  Surgeon: Jonnie Kind, MD;  Location: [facility];  Service: Gynecology;  Laterality: N/A;  . Polypectomy N/A 11/06/2013    Procedure: POLYPECTOMY (REMOVAL ENDOMETRIAL POLYP);  Surgeon: Jonnie Kind, MD;  Location: [facility];  Service: Gynecology;  Laterality: N/A;  . Rectocele repair N/A 11/06/2013    Procedure: POSTERIOR REPAIR (RECTOCELE);  Surgeon: Jonnie Kind, MD;  Location: [facility];  Service: Gynecology;  Laterality: N/A;   Family History  Problem Relation Age of Onset  . Hypertension Mother   . Heart disease Mother   . Diabetes Mother   . Kidney disease Mother     ESRD  . Cancer Mother     Bladder  . Hypertension Father   . Hyperlipidemia Father   . Heart disease Father   . Diabetes Father   . Cancer Father     Stomach Cancer  . Diabetes Sister   . Heart disease Sister   .  Cancer Maternal Uncle     prostate  . Alzheimer's disease Paternal Aunt   . Cancer Paternal Uncle     stomach  . Stroke Maternal Grandfather   . Cancer Paternal Grandfather     stomach   History  Substance Use Topics  . Smoking status: Passive Smoke Exposure - Never Smoker  . Smokeless tobacco: Never Used     Comment: husband smokes, but pt doesn't   . Alcohol Use: Yes     Comment: occasional drink    OB History    Gravida Para Term Preterm AB TAB SAB Ectopic Multiple Living   2 2        2      Review of Systems  Respiratory: Positive for wheezing.   Musculoskeletal: Positive for arthralgias.  All other systems reviewed and are negative.     Allergies  Arimidex; Adhesive; Lisinopril; and Tetracyclines & related  Home  Medications   Prior to Admission medications   Medication Sig Start Date End Date Taking? Authorizing Provider  acetaminophen (TYLENOL) 500 MG tablet Take 500 mg by mouth as needed for moderate pain. Takes 2 when needed    Historical Provider, MD  albuterol (PROVENTIL HFA;VENTOLIN HFA) 108 (90 BASE) MCG/ACT inhaler Inhale 2 puffs into the lungs every 4 (four) hours as needed. 01/17/13   Alycia Rossetti, MD  Aloe Vera Leaf POWD Apply 600 mg topically as needed (irritation).     Historical Provider, MD  b complex vitamins tablet Take 1 tablet by mouth daily.    Historical Provider, MD  Bioflavonoid Products (ESTER-C) 1000-50 MG TABS Take by mouth 2 (two) times daily.    Historical Provider, MD  Cholecalciferol (VITAMIN D3) 2000 UNITS TABS Take 2,000 Units by mouth 2 (two) times daily.    Historical Provider, MD  Docusate Calcium (STOOL SOFTENER PO) Take 1 capsule by mouth as needed (constipation).     Historical Provider, MD  DULERA 100-5 MCG/ACT AERO INHALE 2 PUFFS BY MOUTH TWICE DAILY 06/26/14   Alycia Rossetti, MD  Ginkgo Biloba 120 MG CAPS Take 1 capsule by mouth daily.     Historical Provider, MD  glipiZIDE (GLUCOTROL) 10 MG tablet TAKE 1 TABLET BY MOUTH TWICE DAILY BEFORE A MEAL 07/01/14   Alycia Rossetti, MD  JANUVIA 50 MG tablet TAKE 1 TABLET BY MOUTH EVERY DAY 07/16/13   Alycia Rossetti, MD  KRILL OIL PO Take 1 capsule by mouth 2 (two) times daily.    Historical Provider, MD  Melatonin 3 MG TABS Take 3 mg by mouth at bedtime as needed and may repeat dose one time if needed (sleep).     Historical Provider, MD  metFORMIN (GLUCOPHAGE) 1000 MG tablet TAKE 1 TABLET BY MOUTH TWICE DAILY WITH A MEAL 06/11/14   Alycia Rossetti, MD  montelukast (SINGULAIR) 10 MG tablet TAKE 1 TABLET BY MOUTH EVERY NIGHT AT BEDTIME 04/16/14   Alycia Rossetti, MD  naftifine (NAFTIN) 1 % cream Apply 1 application topically daily.    Historical Provider, MD  naproxen (NAPROSYN) 500 MG tablet Take 1 tablet (500 mg  total) by mouth 2 (two) times daily as needed. 10/11/11   Alycia Rossetti, MD  NONFORMULARY OR COMPOUNDED ITEM Apply 1 application topically 2 (two) times daily as needed. Ketoconazole & fluticasone cream 1:1    Historical Provider, MD  polyethylene glycol (MIRALAX / GLYCOLAX) packet Take 17 g by mouth daily.    Historical Provider, MD  simethicone (MYLICON) 80 MG  chewable tablet Chew 80 mg by mouth as needed for flatulence.     Historical Provider, MD  simvastatin (ZOCOR) 40 MG tablet TAKE 1 TABLET BY MOUTH EVERY EVENING 04/16/14   Alycia Rossetti, MD  tamoxifen (NOLVADEX) 20 MG tablet Take 1 tablet (20 mg total) by mouth at bedtime. 02/15/14   Baird Cancer, PA-C  triamterene-hydrochlorothiazide (MAXZIDE) 75-50 MG per tablet TAKE 1 TABLET BY MOUTH EVERY DAY 07/01/14   Alycia Rossetti, MD   BP 134/53 mmHg  Pulse 88  Temp(Src) 97.7 F (36.5 C) (Oral)  Ht 5\' 5"  (1.651 m)  Wt 244 lb (110.678 kg)  BMI 40.60 kg/m2  SpO2 99% Physical Exam  Constitutional: She is oriented to person, place, and time. She appears well-developed and well-nourished.  Non-toxic appearance.  HENT:  Head: Normocephalic.  Right Ear: Tympanic membrane and external ear normal.  Left Ear: Tympanic membrane and external ear normal.  Eyes: EOM and lids are normal. Pupils are equal, round, and reactive to light.  Neck: Normal range of motion. Neck supple. Carotid bruit is not present.  Cardiovascular: Normal rate, regular rhythm, normal heart sounds, intact distal pulses and normal pulses.   Pulmonary/Chest: Breath sounds normal. No respiratory distress.  Abdominal: Soft. Bowel sounds are normal. There is no tenderness. There is no guarding.  Musculoskeletal:       Left shoulder: She exhibits tenderness and pain. She exhibits normal range of motion and no swelling.       Arms:      Left hand: She exhibits decreased range of motion, tenderness and swelling. Normal sensation noted. Normal strength noted.        Hands: Lymphadenopathy:       Head (right side): No submandibular adenopathy present.       Head (left side): No submandibular adenopathy present.    She has no cervical adenopathy.  Neurological: She is alert and oriented to person, place, and time. She has normal strength. No cranial nerve deficit or sensory deficit.  Skin: Skin is warm and dry.  Psychiatric: She has a normal mood and affect. Her speech is normal.  Nursing note and vitals reviewed.   ED Course  Procedures (including critical care time) Labs Review Labs Reviewed - No data to display  Imaging Review No results found.   EKG Interpretation None      MDM  The x-rays of the left hand and shoulder are negative for fracture or dislocation. The patient was ambulatory without problem. The patient will be treated with Tylenol or ibuprofen for mild discomfort. She will use an ice pack to the left hand. She is given a prescription for Norco in the event of more severe pain. Patient will see her primary physician or return to the emergency department if any changes, problems, or concerns.    Final diagnoses:  None    **I have reviewed nursing notes, vital signs, and all appropriate lab and imaging results for this patient.Lily Kocher, PA-C 07/06/14 1816  Noemi Chapel, MD 07/07/14 [phone]

## 2014-07-06 NOTE — ED Notes (Signed)
Patient with no complaints at this time. Respirations even and unlabored. Skin warm/dry. Discharge instructions reviewed with patient at this time. Patient given opportunity to voice concerns/ask questions. Patient discharged at this time and left Emergency Department with steady gait.   

## 2014-07-06 NOTE — Discharge Instructions (Signed)
Your xrays are negative for fracture or dislocation. Please use tylenol or ibuprofen for mild soreness. Use norco for more severe pain. Please use ice to the left hand when possible. Finger Sprain A finger sprain is a tear in one of the strong, fibrous tissues that connect the bones (ligaments) in your finger. The severity of the sprain depends on how much of the ligament is torn. The tear can be either partial or complete. CAUSES  Often, sprains are a result of a fall or accident. If you extend your hands to catch an object or to protect yourself, the force of the impact causes the fibers of your ligament to stretch too much. This excess tension causes the fibers of your ligament to tear. SYMPTOMS  You may have some loss of motion in your finger. Other symptoms include:  Bruising.  Tenderness.  Swelling. DIAGNOSIS  In order to diagnose finger sprain, your caregiver will physically examine your finger or thumb to determine how torn the ligament is. Your caregiver may also suggest an X-ray exam of your finger to make sure no bones are broken. TREATMENT  If your ligament is only partially torn, treatment usually involves keeping the finger in a fixed position (immobilization) for a short period. To do this, your caregiver will apply a bandage, cast, or splint to keep your finger from moving until it heals. For a partially torn ligament, the healing process usually takes 2 to 3 weeks. If your ligament is completely torn, you may need surgery to reconnect the ligament to the bone. After surgery a cast or splint will be applied and will need to stay on your finger or thumb for 4 to 6 weeks while your ligament heals. HOME CARE INSTRUCTIONS  Keep your injured finger elevated, when possible, to decrease swelling.  To ease pain and swelling, apply ice to your joint twice a day, for 2 to 3 days:  Put ice in a plastic bag.  Place a towel between your skin and the bag.  Leave the ice on for 15  minutes.  Only take over-the-counter or prescription medicine for pain as directed by your caregiver.  Do not wear rings on your injured finger.  Do not leave your finger unprotected until pain and stiffness go away (usually 3 to 4 weeks).  Do not allow your cast or splint to get wet. Cover your cast or splint with a plastic bag when you shower or bathe. Do not swim.  Your caregiver may suggest special exercises for you to do during your recovery to prevent or limit permanent stiffness. SEEK IMMEDIATE MEDICAL CARE IF:  Your cast or splint becomes damaged.  Your pain becomes worse rather than better. MAKE SURE YOU:  Understand these instructions.  Will watch your condition.  Will get help right away if you are not doing well or get worse. Document Released: 05/13/2004 Document Revised: 06/28/2011 Document Reviewed: 12/07/2010 [facility] Patient Information 2015 [place], [place]. This information is not intended to replace advice given to you by your health care provider. Make sure you discuss any questions you have with your health care provider.

## 2014-07-06 NOTE — ED Notes (Signed)
Fall while putting soil in her grocery cart.  C/o pain to left middle finger.  Left shoulder and bilateral knees.

## 2014-07-15 ENCOUNTER — Other Ambulatory Visit: Payer: Self-pay | Admitting: Family Medicine

## 2014-07-15 NOTE — Telephone Encounter (Signed)
Medication refilled per protocol. 

## 2014-07-31 ENCOUNTER — Other Ambulatory Visit: Payer: Self-pay | Admitting: Family Medicine

## 2014-07-31 DIAGNOSIS — E1141 Type 2 diabetes mellitus with diabetic mononeuropathy: Secondary | ICD-10-CM | POA: Diagnosis not present

## 2014-07-31 DIAGNOSIS — I10 Essential (primary) hypertension: Secondary | ICD-10-CM | POA: Diagnosis not present

## 2014-07-31 DIAGNOSIS — E785 Hyperlipidemia, unspecified: Secondary | ICD-10-CM | POA: Diagnosis not present

## 2014-07-31 DIAGNOSIS — N182 Chronic kidney disease, stage 2 (mild): Secondary | ICD-10-CM | POA: Diagnosis not present

## 2014-07-31 LAB — COMPLETE METABOLIC PANEL WITH GFR
ALBUMIN: 3.8 g/dL (ref 3.5–5.2)
ALT: 42 U/L — AB (ref 0–35)
AST: 43 U/L — AB (ref 0–37)
Alkaline Phosphatase: 39 U/L (ref 39–117)
BUN: 22 mg/dL (ref 6–23)
CALCIUM: 9.2 mg/dL (ref 8.4–10.5)
CHLORIDE: 103 meq/L (ref 96–112)
CO2: 24 mEq/L (ref 19–32)
Creat: 1.02 mg/dL (ref 0.50–1.10)
GFR, Est African American: 64 mL/min
GFR, Est Non African American: 55 mL/min — ABNORMAL LOW
Glucose, Bld: 133 mg/dL — ABNORMAL HIGH (ref 70–99)
POTASSIUM: 4.2 meq/L (ref 3.5–5.3)
Sodium: 141 mEq/L (ref 135–145)
Total Bilirubin: 0.5 mg/dL (ref 0.2–1.2)
Total Protein: 6.3 g/dL (ref 6.0–8.3)

## 2014-07-31 LAB — LIPID PANEL
Cholesterol: 130 mg/dL (ref 0–200)
HDL: 39 mg/dL — ABNORMAL LOW (ref >=46)
LDL CALC: 51 mg/dL (ref 0–99)
TRIGLYCERIDES: 201 mg/dL — AB (ref <150)
Total CHOL/HDL Ratio: 3.3 Ratio
VLDL: 40 mg/dL (ref 0–40)

## 2014-08-01 LAB — CBC WITH DIFFERENTIAL/PLATELET
BASOS PCT: 0 % (ref 0–1)
Basophils Absolute: 0 10*3/uL (ref 0.0–0.1)
Eosinophils Absolute: 0.2 10*3/uL (ref 0.0–0.7)
Eosinophils Relative: 2 % (ref 0–5)
HCT: 40.8 % (ref 36.0–46.0)
Hemoglobin: 13.4 g/dL (ref 12.0–15.0)
Lymphocytes Relative: 30 % (ref 12–46)
Lymphs Abs: 3.1 10*3/uL (ref 0.7–4.0)
MCH: 27.2 pg (ref 26.0–34.0)
MCHC: 32.8 g/dL (ref 30.0–36.0)
MCV: 82.8 fL (ref 78.0–100.0)
MONOS PCT: 7 % (ref 3–12)
MPV: 10.5 fL (ref 8.6–12.4)
Monocytes Absolute: 0.7 10*3/uL (ref 0.1–1.0)
NEUTROS ABS: 6.3 10*3/uL (ref 1.7–7.7)
NEUTROS PCT: 61 % (ref 43–77)
PLATELETS: 254 10*3/uL (ref 150–400)
RBC: 4.93 MIL/uL (ref 3.87–5.11)
RDW: 14.5 % (ref 11.5–15.5)
WBC: 10.3 10*3/uL (ref 4.0–10.5)

## 2014-08-01 LAB — MICROALBUMIN / CREATININE URINE RATIO
Creatinine, Urine: 131 mg/dL
Microalb Creat Ratio: 3.1 mg/g (ref 0.0–30.0)
Microalb, Ur: 0.4 mg/dL (ref <2.0)

## 2014-08-01 LAB — HEMOGLOBIN A1C
Hgb A1c MFr Bld: 7.1 % — ABNORMAL HIGH (ref <5.7)
MEAN PLASMA GLUCOSE: 157 mg/dL — AB (ref <117)

## 2014-08-02 ENCOUNTER — Ambulatory Visit (INDEPENDENT_AMBULATORY_CARE_PROVIDER_SITE_OTHER): Payer: Medicare Other | Admitting: Family Medicine

## 2014-08-02 ENCOUNTER — Other Ambulatory Visit: Payer: Self-pay | Admitting: Family Medicine

## 2014-08-02 ENCOUNTER — Encounter: Payer: Self-pay | Admitting: Family Medicine

## 2014-08-02 VITALS — BP 128/74 | HR 74 | Temp 98.2°F | Resp 16 | Ht 65.0 in | Wt 243.0 lb

## 2014-08-02 DIAGNOSIS — R252 Cramp and spasm: Secondary | ICD-10-CM

## 2014-08-02 DIAGNOSIS — Z23 Encounter for immunization: Secondary | ICD-10-CM

## 2014-08-02 DIAGNOSIS — M67442 Ganglion, left hand: Secondary | ICD-10-CM | POA: Diagnosis not present

## 2014-08-02 DIAGNOSIS — E1141 Type 2 diabetes mellitus with diabetic mononeuropathy: Secondary | ICD-10-CM | POA: Diagnosis not present

## 2014-08-02 DIAGNOSIS — E785 Hyperlipidemia, unspecified: Secondary | ICD-10-CM

## 2014-08-02 DIAGNOSIS — I10 Essential (primary) hypertension: Secondary | ICD-10-CM | POA: Diagnosis not present

## 2014-08-02 DIAGNOSIS — N182 Chronic kidney disease, stage 2 (mild): Secondary | ICD-10-CM

## 2014-08-02 MED ORDER — TIZANIDINE HCL 4 MG PO TABS
4.0000 mg | ORAL_TABLET | Freq: Every evening | ORAL | Status: DC | PRN
Start: 2014-08-02 — End: 2014-08-24

## 2014-08-02 NOTE — Patient Instructions (Addendum)
1/2 tablet of zocor Try the zanaflex before bedtime for cramps Cream refilled  Prevnar 13  Congratulations on weight loss F/U 3 months

## 2014-08-02 NOTE — Progress Notes (Signed)
Patient ID: Julie Clay, female   DOB: 11/21/1940, 74 y.o.   MRN: [id number] 08/04/2014       Subjective:    Patient ID: Julie Clay, female    DOB: [birth year], 74 y.o.   MRN: [id number]  Patient presents for 3 month F/U patient to follow-up chronic medical problems. She has lost several pounds since her last visit intentionally. She's been trying to watch what she is eating. She is unable to be very active because of her arthritis in her habitus. She states she even has soreness in her buttocks when she sits she is tried using different pillows because all types of chairs and conditions cause pain on her buttocks. She did have a fall recently which resulted in bruising to her fingers she was seen in the emergency room for this there was no fracture but she does have a small little nodule which is nontender that is residual.  She continues how worsening cramps in both legs she's tried magnesium tried increasing her fluids but they're quite severe and worse at nighttime. She also gets aching in her legs.  Diabetes mellitus she's been watching her blood sugars she states they range less than 120 fasting. Her A1c returned at 7.1% she did increase her Januvia to 50 mg after her blood sugars were going up about 3 or 4 weeks ago they were in the 200s fasting and they have been down since then.      Review Of Systems:  GEN- denies fatigue, fever, weight loss,weakness, recent illness HEENT- denies eye drainage, change in vision, nasal discharge, CVS- denies chest pain, palpitations RESP- denies SOB, cough, wheeze ABD- denies N/V, change in stools, abd pain GU- denies dysuria, hematuria, dribbling, incontinence MSK-+joint pain, muscle aches, injury Neuro- denies headache, dizziness, syncope, seizure activity       Objective:    BP 128/74 mmHg  Pulse 74  Temp(Src) 98.2 F (36.8 C) (Oral)  Resp 16  Ht 5\' 5"  (1.651 m)  Wt 243 lb (110.224 kg)  BMI 40.44 kg/m2 GEN- NAD, alert and  oriented x3 HEENT- PERRL, EOMI, non injected sclera, pink conjunctiva, MMM, oropharynx clear Neck- Supple, no bruit CVS- RRR, no murmur RESP-CTAB EXT- No edema Pulses- Radial, DP- 2+        Assessment & Plan:      Problem List Items Addressed This Visit    Morbidly obese    Some weight loss noted      Leg cramps    Possible due to statins, will have her decrease to 1/2 tablet, will also give zanaflex prn She has tried magnesium other OTC and home remedies      Hyperlipidemia   Relevant Orders   COMPLETE METABOLIC PANEL WITH GFR   CBC with Differential   Lipid Panel   Hemoglobin A1c   Microalbumin/Creatinine Ratio, Urine   Ganglion cyst of flexor tendon sheath of finger of left hand    Post traumatic cyst, we will just monitor, no pain currently      Essential hypertension, benign    Well controlled no change to meds      Relevant Orders   COMPLETE METABOLIC PANEL WITH GFR   CBC with Differential   Lipid Panel   Hemoglobin A1c   Microalbumin/Creatinine Ratio, Urine   DM (diabetes mellitus) - Primary    Well controlled, A1C at 7.1%, continue januvia at 50mg , continue all other diabetic meds      Relevant Orders   COMPLETE METABOLIC PANEL WITH  GFR   CBC with Differential   Lipid Panel   Hemoglobin A1c   Microalbumin/Creatinine Ratio, Urine   CKD (chronic kidney disease), stage II   Relevant Orders   COMPLETE METABOLIC PANEL WITH GFR   CBC with Differential   Lipid Panel   Hemoglobin A1c   Microalbumin/Creatinine Ratio, Urine    Other Visit Diagnoses    Need for prophylactic vaccination against Streptococcus pneumoniae (pneumococcus)        Relevant Orders    Pneumococcal conjugate vaccine 13-valent (Completed)       Note: This dictation was prepared with Dragon dictation along with smaller phrase technology. Any transcriptional errors that result from this process are unintentional.

## 2014-08-04 NOTE — Assessment & Plan Note (Signed)
Post traumatic cyst, we will just monitor, no pain currently

## 2014-08-04 NOTE — Assessment & Plan Note (Signed)
Possible due to statins, will have her decrease to 1/2 tablet, will also give zanaflex prn She has tried magnesium other OTC and home remedies

## 2014-08-04 NOTE — Assessment & Plan Note (Signed)
Some weight loss noted

## 2014-08-04 NOTE — Assessment & Plan Note (Signed)
Well controlled, A1C at 7.1%, continue januvia at 50mg , continue all other diabetic meds

## 2014-08-04 NOTE — Assessment & Plan Note (Signed)
Well controlled no change to meds 

## 2014-08-05 ENCOUNTER — Other Ambulatory Visit: Payer: Self-pay | Admitting: Family Medicine

## 2014-08-05 NOTE — Telephone Encounter (Signed)
Just refilled last week for #30 +2 but pt wants a 90 day supply??

## 2014-08-05 NOTE — Telephone Encounter (Signed)
Medication refilled per protocol. 

## 2014-08-05 NOTE — Telephone Encounter (Signed)
Have her try it first and see if it works, then 90 day supply can be sent

## 2014-08-06 NOTE — Telephone Encounter (Signed)
90 day supply sent back denied

## 2014-08-14 ENCOUNTER — Encounter: Payer: Self-pay | Admitting: Family Medicine

## 2014-08-14 ENCOUNTER — Ambulatory Visit (INDEPENDENT_AMBULATORY_CARE_PROVIDER_SITE_OTHER): Payer: Medicare Other | Admitting: Family Medicine

## 2014-08-14 VITALS — BP 130/74 | HR 86 | Temp 98.4°F | Resp 18 | Ht 65.0 in | Wt 246.0 lb

## 2014-08-14 DIAGNOSIS — M7989 Other specified soft tissue disorders: Secondary | ICD-10-CM

## 2014-08-14 DIAGNOSIS — J209 Acute bronchitis, unspecified: Secondary | ICD-10-CM | POA: Diagnosis not present

## 2014-08-14 MED ORDER — ALBUTEROL SULFATE HFA 108 (90 BASE) MCG/ACT IN AERS: 2.0000 | INHALATION_SPRAY | RESPIRATORY_TRACT | Status: DC | PRN

## 2014-08-14 MED ORDER — FUROSEMIDE 20 MG PO TABS: 20.0000 mg | ORAL_TABLET | Freq: Every day | ORAL | Status: DC

## 2014-08-14 MED ORDER — AZITHROMYCIN 250 MG PO TABS: ORAL_TABLET | ORAL | Status: DC

## 2014-08-14 MED ORDER — GUAIFENESIN-CODEINE 100-10 MG/5ML PO SOLN: 5.0000 mL | Freq: Four times a day (QID) | ORAL | Status: DC | PRN

## 2014-08-14 NOTE — Progress Notes (Signed)
Patient ID: Julie Clay, female   DOB: [birth year], 74 y.o.   MRN: [id number]   Subjective:    Patient ID: Julie Clay, female    DOB: 1940-07-12, 74 y.o.   MRN: [id number]  Patient presents for Illness  patient here with cough with congestion and occasional wheeze for the past 3 days. Positive sick contact with her husband. She has not had any significant fever. Very mild shortness of breath but no chest pain. The cough is keeping her up at night. She has been using Robitussin during the day she ran out of her codeine cough medicine and will like this refilled She also noticed for the past 2 days she's had some swelling in her ankles and feet she states it does occur when she is out in the hot weather. Her Lasix has expired.    Review Of Systems:  GEN- +fatigue, fever, weight loss,weakness, recent illness HEENT- denies eye drainage, change in vision, nasal discharge, CVS- denies chest pain, palpitations RESP- denies SOB,+ cough, wheeze ABD- denies N/V, change in stools, abd pain GU- denies dysuria, hematuria, dribbling, incontinence MSK- denies joint pain, muscle aches, injury Neuro- denies headache, dizziness, syncope, seizure activity       Objective:    BP 130/74 mmHg  Pulse 86  Temp(Src) 98.4 F (36.9 C) (Oral)  Resp 18  Ht '5\' 5"'$  (1.651 m)  Wt 246 lb (111.585 kg)  BMI 40.94 kg/m2  SpO2 98% GEN- NAD, alert and oriented x3, non toxic appearing HEENT- PERRL, EOMI, non injected sclera, pink conjunctiva, MMM, oropharynx clear Neck- Supple, no  LAD, no JVD CVS- RRR, no murmur RESP-coarse BS, no wheeze, no retraction, no rales EXT- pedal  edema Pulses- Radial, DP- 2+        Assessment & Plan:      Problem List Items Addressed This Visit    None    Visit Diagnoses    Acute bronchitis, unspecified organism    -  Primary    underlying asthma, continue dulera, add zpak, albuterol , robitussin codiene, she does not tolerate SE of prednisone    Leg swelling        with habitus and other comorbidites not uncommon for her to swell on and on, no sign of CHF, given lasix '20mg'$  take 1-2 tabs daily for next few days,         Note: This dictation was prepared with Dragon dictation along with smaller phrase technology. Any transcriptional errors that result from this process are unintentional.

## 2014-08-14 NOTE — Patient Instructions (Signed)
Take antibiotics as prescribed Use albuterol Cough medicine as prescribed Lasix '20mg'$  daily for 3-5 days, if not improved increase to '40mg'$  daily F/U as previous

## 2014-08-24 ENCOUNTER — Other Ambulatory Visit: Payer: Self-pay | Admitting: Family Medicine

## 2014-08-26 NOTE — Telephone Encounter (Signed)
Refill appropriate and filled per protocol. 

## 2014-09-08 ENCOUNTER — Other Ambulatory Visit: Payer: Self-pay | Admitting: Family Medicine

## 2014-09-09 NOTE — Telephone Encounter (Signed)
Medication refill per protocol °

## 2014-09-12 ENCOUNTER — Other Ambulatory Visit: Payer: Self-pay | Admitting: Family Medicine

## 2014-09-12 NOTE — Telephone Encounter (Signed)
Refill appropriate and filled per protocol. 

## 2014-09-17 ENCOUNTER — Other Ambulatory Visit (HOSPITAL_COMMUNITY): Payer: Self-pay

## 2014-09-17 DIAGNOSIS — C50912 Malignant neoplasm of unspecified site of left female breast: Secondary | ICD-10-CM

## 2014-09-18 ENCOUNTER — Ambulatory Visit (HOSPITAL_COMMUNITY): Payer: Medicare Other | Admitting: Hematology & Oncology

## 2014-09-18 ENCOUNTER — Encounter (HOSPITAL_COMMUNITY): Payer: Self-pay | Admitting: Hematology & Oncology

## 2014-09-18 ENCOUNTER — Encounter (HOSPITAL_BASED_OUTPATIENT_CLINIC_OR_DEPARTMENT_OTHER): Payer: Medicare Other | Admitting: Hematology & Oncology

## 2014-09-18 ENCOUNTER — Encounter (HOSPITAL_COMMUNITY): Payer: Medicare Other | Attending: Hematology & Oncology

## 2014-09-18 VITALS — BP 148/81 | HR 94 | Temp 98.0°F | Resp 22 | Wt 240.2 lb

## 2014-09-18 DIAGNOSIS — Z79810 Long term (current) use of selective estrogen receptor modulators (SERMs): Secondary | ICD-10-CM | POA: Diagnosis not present

## 2014-09-18 DIAGNOSIS — R945 Abnormal results of liver function studies: Secondary | ICD-10-CM | POA: Diagnosis not present

## 2014-09-18 DIAGNOSIS — C50912 Malignant neoplasm of unspecified site of left female breast: Secondary | ICD-10-CM

## 2014-09-18 DIAGNOSIS — Z170 Estrogen receptor positive status [ER+]: Secondary | ICD-10-CM

## 2014-09-18 LAB — COMPREHENSIVE METABOLIC PANEL
ALT: 57 U/L — AB (ref 14–54)
AST: 72 U/L — ABNORMAL HIGH (ref 15–41)
Albumin: 3.6 g/dL (ref 3.5–5.0)
Alkaline Phosphatase: 41 U/L (ref 38–126)
Anion gap: 12 (ref 5–15)
BUN: 27 mg/dL — AB (ref 6–20)
CHLORIDE: 103 mmol/L (ref 101–111)
CO2: 24 mmol/L (ref 22–32)
CREATININE: 1.31 mg/dL — AB (ref 0.44–1.00)
Calcium: 9.4 mg/dL (ref 8.9–10.3)
GFR calc Af Amer: 46 mL/min — ABNORMAL LOW (ref >60)
GFR, EST NON AFRICAN AMERICAN: 40 mL/min — AB (ref >60)
GLUCOSE: 257 mg/dL — AB (ref 65–99)
Potassium: 3.9 mmol/L (ref 3.5–5.1)
Sodium: 139 mmol/L (ref 135–145)
TOTAL PROTEIN: 7 g/dL (ref 6.5–8.1)
Total Bilirubin: 0.4 mg/dL (ref 0.3–1.2)

## 2014-09-18 LAB — CBC WITH DIFFERENTIAL/PLATELET
BASOS ABS: 0 10*3/uL (ref 0.0–0.1)
Basophils Relative: 0 % (ref 0–1)
EOS ABS: 0.2 10*3/uL (ref 0.0–0.7)
Eosinophils Relative: 2 % (ref 0–5)
HEMATOCRIT: 40.3 % (ref 36.0–46.0)
HEMOGLOBIN: 13 g/dL (ref 12.0–15.0)
LYMPHS ABS: 2.3 10*3/uL (ref 0.7–4.0)
Lymphocytes Relative: 23 % (ref 12–46)
MCH: 27.2 pg (ref 26.0–34.0)
MCHC: 32.3 g/dL (ref 30.0–36.0)
MCV: 84.3 fL (ref 78.0–100.0)
Monocytes Absolute: 0.9 10*3/uL (ref 0.1–1.0)
Monocytes Relative: 9 % (ref 3–12)
Neutro Abs: 6.6 10*3/uL (ref 1.7–7.7)
Neutrophils Relative %: 66 % (ref 43–77)
Platelets: 243 10*3/uL (ref 150–400)
RBC: 4.78 MIL/uL (ref 3.87–5.11)
RDW: 13.5 % (ref 11.5–15.5)
WBC: 9.9 10*3/uL (ref 4.0–10.5)

## 2014-09-18 NOTE — Progress Notes (Signed)
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   INTERVAL HISTORY: Julie Clay 74 y.o. 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  Started on Arimidex in Feb or March 2013 but developed intolerance to this medication.  She was therefore switched to Tamoxifen in Jan 2014.  She will take anti-endocrine therapy for 5 years.  She declined radiation therapy at the time of presentation in December 2012.   She reports her only problem is her asthma. She has a problem with muscle cramps that is chronic.She has had leg cramps for a while. The other body cramps began in the last couple of years.  She is losing weight by controlling her portions, eating less more frequently. She is pleased with her weight loss.  She attributes her oncotype score to seeing a naturopathic physician at the time of her cancer diagnosis. She was given mushroom supplements, and they suggested to cut out white flour and eat a cup of berries each day. She began chiropractic care. Her max activity level is 5 minutes, then she needs to rest. If she goes to a museum she takes her scooter. With short grocery trips she doesn't use her scooter.  The lymphedema began after her shoulder surgery, not the breast cancer. She is wearing a pressure sleeve on her left arm. Her husband massages her lymphedema every night like they showed how to do at the clinic.  She is up to date on her mammograms. She is not up to date on her colonoscopy but recently had surgery "down there" due to a herniated colon/polyp by Dr. Glo Herring and doesn't want to be scheduled  for a colonoscopy. She has had no vaginal bleeding.  Her primary care doctor is Dr. Buelah Manis.     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  D/C'd due to arthralgias and myalgias   04/19/2012 -  Chemotherapy Tamoxifen started    Past Medical History  Diagnosis Date  . Hypertension   . Hyperlipidemia   . Diabetes mellitus   . Asthma     spirometry (2011)- mild ventilary defect  . Cancer 2012    Breast  . Arthritis     Osteoarthritis  . Fatty liver 2013    enlarged  . Sliding hiatal hernia   . Ventral hernia, unspecified, without mention of obstruction or gangrene   . Lung nodule seen on imaging study 2013  . HSV (herpes simplex virus) infection   . Lymphedema     Left arm  . 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 Calcification of left breast   . Arthritis   . Shortness of breath     with exertion  . Sleep apnea     Stop Bang score of 5. Pt has had sleep study, but was shown to be negative for sleep apnea.  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; Essential hypertension, benign; DM (diabetes mellitus); OA (osteoarthritis); Morbidly obese; Asthma; Hyperlipidemia; Chronic fatigue; OSA (obstructive sleep apnea); Dyspnea; HSV (  herpes simplex virus) infection; Peripheral edema; Pain in joint, shoulder region; Rotator cuff syndrome of left shoulder; Muscle weakness (generalized); Occupational therapy encounter; Hip pain; Insomnia; Pain, joint, multiple sites; CKD (chronic kidney disease), stage II; Diabetic neuropathy; Pre-ulcerative calluses; Pelvic pain; Bladder prolapse, female, acquired; Endometrial polyp; Rectocele; Atypical ductal hyperplasia of left breast; Palpitations; Post-operative state; Skin irritation; Elevated LFTs; Leg cramps; and Ganglion cyst of flexor tendon sheath of finger of left hand on her problem list.     is allergic to arimidex;  adhesive; lisinopril; and tetracyclines & related.  Ms. Dols had no medications administered during this visit.  Past Surgical History  Procedure Laterality Date  . Cesarean section    . 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 Breast surgery  jan 2013  . Hand ligament reconstruction Right   . Cataract extraction, bilateral      on different occassions   . Shoulder arthroscopy with open rotator cuff repair Left 07/26/2012  . Breast biopsy  06/22/13  . Breast biopsy Left 09/12/2013    Procedure: BREAST BIOPSY WITH NEEDLE LOCALIZATION;  Surgeon: Jamesetta So, MD;  Location: [facility];  Service: General;  Laterality: Left;  . Hysteroscopy w/d&c N/A 11/06/2013    Procedure: DILATATION AND CURETTAGE /HYSTEROSCOPY;  Surgeon: Jonnie Kind, MD;  Location: [facility];  Service: Gynecology;  Laterality: N/A;  . Polypectomy N/A 11/06/2013    Procedure: POLYPECTOMY (REMOVAL ENDOMETRIAL POLYP);  Surgeon: Jonnie Kind, MD;  Location: [facility];  Service: Gynecology;  Laterality: N/A;  . Rectocele repair N/A 11/06/2013    Procedure: POSTERIOR REPAIR (RECTOCELE);  Surgeon: Jonnie Kind, MD;  Location: [facility];  Service: Gynecology;  Laterality: N/A;    Denies any headaches, dizziness, double vision, fevers, chills, night sweats, nausea, vomiting, diarrhea, constipation, chest pain, heart palpitations, shortness of breath, blood in stool, black tarry stool, urinary pain, urinary burning, urinary frequency, hematuria. Denies vaginal bleeding.   PHYSICAL EXAMINATION  ECOG PERFORMANCE STATUS: 1 - Symptomatic but completely ambulatory  Filed Vitals:   09/18/14 1254  BP: 148/81  Pulse: 94  Temp: 98 F (36.7 C)  Resp: 22    GENERAL:alert, no distress, well nourished, well developed, comfortable, cooperative, obese and smiling SKIN: skin color, texture, turgor are normal, no rashes or significant lesions HEAD: Normocephalic, No masses, lesions, tenderness or abnormalities EYES: normal, PERRLA,  EOMI, Conjunctiva are pink and non-injected EARS: External ears normal OROPHARYNX:mucous membranes are moist  NECK: supple, no adenopathy, thyroid normal size, non-tender, without nodularity, no stridor, non-tender, trachea midline LYMPH:  no palpable lymphadenopathy BREAST: Left breast incision sites, inferior to nipple at  6 o'clock position, 2nd incision site at the 6 o clock position. R breast is unremarkable LUNGS: clear to auscultation and percussion HEART: regular rate & rhythm, no murmurs, no gallops, S1 normal and S2 normal ABDOMEN:obese and normal bowel sounds BACK: Back symmetric, no curvature. EXTREMITIES:less then 2 second capillary refill, no joint deformities, effusion, or inflammation, no skin discoloration, no cyanosis  NEURO: alert & oriented x 3 with fluent speech, no focal motor/sensory deficits, gait normal   LABORATORY DATA: CBC    Component Value Date/Time   WBC 9.9 09/18/2014 1234   RBC 4.78 09/18/2014 1234   HGB 13.0 09/18/2014 1234   HCT 40.3 09/18/2014 1234   PLT 243 09/18/2014 1234   MCV 84.3 09/18/2014 1234   MCH 27.2 09/18/2014 1234   MCHC 32.3 09/18/2014 1234   RDW 13.5 09/18/2014 1234  LYMPHSABS 2.3 09/18/2014 1234   MONOABS 0.9 09/18/2014 1234   EOSABS 0.2 09/18/2014 1234   BASOSABS 0.0 09/18/2014 1234      Chemistry      Component Value Date/Time   NA 139 09/18/2014 1234   K 3.9 09/18/2014 1234   CL 103 09/18/2014 1234   CO2 24 09/18/2014 1234   BUN 27* 09/18/2014 1234   CREATININE 1.31* 09/18/2014 1234   CREATININE 1.02 07/31/2014 0854      Component Value Date/Time   CALCIUM 9.4 09/18/2014 1234   ALKPHOS 41 09/18/2014 1234   AST 72* 09/18/2014 1234   ALT 57* 09/18/2014 1234   BILITOT 0.4 09/18/2014 1234        ASSESSMENT:  1. Left-sided stage I cancer the breast infiltrating ductal type ER positive, PR positive, HER-2/neu nonamplified with an Oncotype DX score 0.  Started on Arimidex in Feb or March 2013 but developed  intolerance to this medication.  She was therefore switched to Tamoxifen in Jan 2014.  She will take anti-endocrine therapy for 5 years.  She declined radiation therapy at the time of presentation in December 2012.  2.  Left breast atypical lobular hyperplasia, S/P lumpectomy by Dr. Arnoldo Morale on 09/12/2013 3. Left full-thickness supraspinatus tear with mild partial-thickness superior infraspinatus tear requiring arthroscopic intervention in April 2014  4. Multiple left breast biopsies (March 2015 and May 2015).Negative work-up. 5. Elevated LFT's  Patient Active Problem List   Diagnosis Date Noted  . Leg cramps 08/02/2014  . Ganglion cyst of flexor tendon sheath of finger of left hand 08/02/2014  . Elevated LFTs 04/28/2014  . Skin irritation 01/19/2014  . Post-operative state 11/14/2013  . Palpitations 10/12/2013  . Atypical ductal hyperplasia of left breast 07/12/2013  . Endometrial polyp 06/25/2013  . Rectocele 06/25/2013  . Diabetic neuropathy 05/09/2013  . Pre-ulcerative calluses 05/09/2013  . Pelvic pain 05/09/2013  . Bladder prolapse, female, acquired 05/09/2013  . Pain, joint, multiple sites 03/26/2013  . 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 Insomnia 11/29/2012  . Hip pain 09/08/2012  . Occupational therapy encounter 08/30/2012  . Muscle weakness (generalized) 06/26/2012  . Rotator cuff syndrome of left shoulder 06/21/2012  . Pain in joint, shoulder region 06/16/2012  . Peripheral edema 02/04/2012  . HSV (herpes simplex virus) infection 12/09/2011  . Dyspnea 11/15/2011  . 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 Essential hypertension, benign 10/11/2011  . DM (diabetes mellitus) 10/11/2011  . OA (osteoarthritis) 10/11/2011  . Morbidly obese 10/11/2011  . Asthma 10/11/2011  . Hyperlipidemia 10/11/2011  . Chronic fatigue 10/11/2011  . OSA (obstructive sleep apnea) 10/11/2011     PLAN:  She will continue with her Tamoxifen  daily. She is up to date on screening mammography. Breast exam was performed today.  In regards to her LFT's, we will recommend repeat labs in 6 weeks. A copy of these labs will be sent to her PCP as she is on zocor.  She has minimal lymphedema on exam today. She should continue wearing her sleeve and with massage as instructed.  Ongoing weight loss was encouraged.   She will return in 6 months with repeat PE and labs.   THERAPY PLAN:  NCCN guidelines recommends the following surveillance for invasive breast cancer:  A. History and Physical exam every 4-6 months for 5 years and then every 12 months.  B. Mammography every 12 months  C. Women on Tamoxifen: annual gynecologic assessment every 12 months if uterus is  present.  D. Women on aromatase inhibitor or who experience ovarian failure secondary to treatment should have monitoring of bone health with a bone mineral density determination at baseline and periodically thereafter.  E. Assess and encourage adherence to adjuvant endocrine therapy.  F. Evidence suggests that active lifestyle and achieving and maintaining an ideal body weight (20-25 BMI) may lead to optimal breast cancer outcomes. //   All questions were answered. The patient knows to call the clinic with any problems, questions or concerns. We can certainly see the patient much sooner if necessary.  This document serves as a record of services personally performed by Ancil Linsey, MD. It was created on her behalf by Arlyce Harman, a trained medical scribe. The creation of this record is based on the scribe's personal observations and the provider's statements to them. This document has been checked and approved by the attending provider.  I have reviewed the above documentation for accuracy and completeness, and I agree with the above. This document was electronically signed. Molli Hazard, MD

## 2014-09-18 NOTE — Progress Notes (Signed)
Labs drawn

## 2014-09-18 NOTE — Patient Instructions (Signed)
[facility] [facility] Discharge Instructions  RECOMMENDATIONS MADE BY THE CONSULTANT AND ANY TEST RESULTS WILL BE SENT TO YOUR REFERRING PHYSICIAN.  Exam completed by Dr Whitney Muse today Follow up with the doctor in 6 months Please call the clinic if you have any questions or concerns  Thank you for choosing [place] at [facility] to provide your oncology and hematology care.  To afford each patient quality time with our provider, please arrive at least 15 minutes before your scheduled appointment time.    You need to re-schedule your appointment should you arrive 10 or more minutes late.  We strive to give you quality time with our providers, and arriving late affects you and other patients whose appointments are after yours.  Also, if you no show three or more times for appointments you may be dismissed from the clinic at the providers discretion.     Again, thank you for choosing [facility].  Our hope is that these requests will decrease the amount of time that you wait before being seen by our physicians.       _____________________________________________________________  Should you have questions after your visit to [facility], please contact our office at [phone] between the hours of 8:30 a.m. and 4:30 p.m.  Voicemails left after 4:30 p.m. will not be returned until the following business day.  For prescription refill requests, have your pharmacy contact our office.

## 2014-09-23 ENCOUNTER — Encounter (HOSPITAL_COMMUNITY): Payer: Self-pay | Admitting: Hematology & Oncology

## 2014-09-23 ENCOUNTER — Telehealth (HOSPITAL_COMMUNITY): Payer: Self-pay

## 2014-09-23 ENCOUNTER — Other Ambulatory Visit (HOSPITAL_COMMUNITY): Payer: Self-pay

## 2014-09-23 DIAGNOSIS — E119 Type 2 diabetes mellitus without complications: Secondary | ICD-10-CM

## 2014-09-23 DIAGNOSIS — C50912 Malignant neoplasm of unspecified site of left female breast: Secondary | ICD-10-CM

## 2014-09-23 NOTE — Telephone Encounter (Signed)
Per patient, she is seeing Dr. Buelah Manis then and Dr. Buelah Manis will be checking her labs.  Instructed to let us know if LFTs were not done at that visit.

## 2014-09-23 NOTE — Telephone Encounter (Signed)
-----   Message from Patrici Ranks, MD sent at 09/23/2014  8:11 AM EDT -----  Please advise patient LFT's are mildly elevated. Send copy of lab to her PCP.   Repeat CMP in 6 weeks. Dr.P

## 2014-09-26 ENCOUNTER — Other Ambulatory Visit: Payer: Self-pay | Admitting: Family Medicine

## 2014-09-27 NOTE — Telephone Encounter (Signed)
Refill appropriate and filled per protocol. 

## 2014-10-09 ENCOUNTER — Encounter (HOSPITAL_COMMUNITY): Payer: Self-pay | Admitting: Physical Therapy

## 2014-10-09 ENCOUNTER — Ambulatory Visit (HOSPITAL_COMMUNITY): Payer: Medicare Other | Attending: Hematology & Oncology | Admitting: Physical Therapy

## 2014-10-09 DIAGNOSIS — M6281 Muscle weakness (generalized): Secondary | ICD-10-CM | POA: Diagnosis not present

## 2014-10-09 DIAGNOSIS — Z853 Personal history of malignant neoplasm of breast: Secondary | ICD-10-CM

## 2014-10-09 DIAGNOSIS — R6889 Other general symptoms and signs: Secondary | ICD-10-CM

## 2014-10-09 DIAGNOSIS — R29898 Other symptoms and signs involving the musculoskeletal system: Secondary | ICD-10-CM | POA: Insufficient documentation

## 2014-10-09 DIAGNOSIS — M6289 Other specified disorders of muscle: Secondary | ICD-10-CM | POA: Diagnosis not present

## 2014-10-09 DIAGNOSIS — R262 Difficulty in walking, not elsewhere classified: Secondary | ICD-10-CM | POA: Diagnosis not present

## 2014-10-09 DIAGNOSIS — R269 Unspecified abnormalities of gait and mobility: Secondary | ICD-10-CM | POA: Diagnosis not present

## 2014-10-09 DIAGNOSIS — R293 Abnormal posture: Secondary | ICD-10-CM | POA: Diagnosis not present

## 2014-10-09 DIAGNOSIS — R2681 Unsteadiness on feet: Secondary | ICD-10-CM

## 2014-10-09 NOTE — Patient Instructions (Signed)
   BRIDGING  While lying on your back, tighten your lower abdominals, squeeze your buttocks and then raise your buttocks off the floor/bed as creating a "Bridge" with your body. Repeat 10 times, twice a day.     Hip abduction   While standing in front of a counter top or something sturdy to hold onto, slowly lift leg to the side. You should keep your trunk straight and just be moving your hip. Repeat 10 times, twice a day.    Hip Extension  Standing tall and keeping knee straight, extend one leg back without leaning forward and then return. Repeat 10 times, twice a day.

## 2014-10-09 NOTE — Therapy (Signed)
[place], [place], [address] Phone: [phone]   Fax:  [phone]  Physical Therapy Evaluation  Patient Details  Name: Julie Clay MRN: [id number] Date of Birth: [birth year] Referring Provider:  Patrici Ranks, MD  Encounter Date: 10/09/2014      PT End of Session - 10/09/14 1619    Visit Number 1   Number of Visits 16   Date for PT Re-Evaluation 11/06/14   Authorization Type Medicare/BCBS   Authorization Time Period 10/09/14 to 12/09/14   Authorization - Visit Number 1   Authorization - Number of Visits 10   PT Start Time 1520   PT Stop Time 1605   PT Time Calculation (min) 45 min   Activity Tolerance Patient tolerated treatment well;Patient limited by fatigue   Behavior During Therapy [facility] for tasks assessed/performed      Past Medical History  Diagnosis Date  . Hypertension   . Hyperlipidemia   . Diabetes mellitus   . Asthma     spirometry (2011)- mild ventilary defect  . Cancer 2012    Breast  . Arthritis     Osteoarthritis  . Fatty liver 2013    enlarged  . Sliding hiatal hernia   . Ventral hernia, unspecified, without mention of obstruction or gangrene   . Lung nodule seen on imaging study 2013  . HSV (herpes simplex virus) infection   . Lymphedema     Left arm  . 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 Calcification of left breast   . Arthritis   . Shortness of breath     with exertion  . Sleep apnea     Stop Bang score of 5. Pt has had sleep study, but was shown to be negative for sleep apnea.  Marland Kitchen Dysrhythmia     palpatations    Past Surgical History  Procedure Laterality Date  . Cesarean section    . 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 Breast surgery  jan 2013  . Hand ligament reconstruction Right   . Cataract extraction, bilateral      on different occassions   . Shoulder arthroscopy with open rotator cuff repair Left 07/26/2012   . Breast biopsy  06/22/13  . Breast biopsy Left 09/12/2013    Procedure: BREAST BIOPSY WITH NEEDLE LOCALIZATION;  Surgeon: Jamesetta So, MD;  Location: [facility];  Service: General;  Laterality: Left;  . Hysteroscopy w/d&c N/A 11/06/2013    Procedure: DILATATION AND CURETTAGE /HYSTEROSCOPY;  Surgeon: Jonnie Kind, MD;  Location: [facility];  Service: Gynecology;  Laterality: N/A;  . Polypectomy N/A 11/06/2013    Procedure: POLYPECTOMY (REMOVAL ENDOMETRIAL POLYP);  Surgeon: Jonnie Kind, MD;  Location: [facility];  Service: Gynecology;  Laterality: N/A;  . Rectocele repair N/A 11/06/2013    Procedure: POSTERIOR REPAIR (RECTOCELE);  Surgeon: Jonnie Kind, MD;  Location: [facility];  Service: Gynecology;  Laterality: N/A;    There were no vitals filed for this visit.  Visit Diagnosis:  History of breast cancer in female - Plan: PT plan of care cert/re-cert  Weakness of both lower extremities - Plan: PT plan of care cert/re-cert  Proximal muscle weakness - Plan: PT plan of care cert/re-cert  Unsteadiness - Plan: PT plan of care cert/re-cert  Poor posture - Plan: PT plan of care cert/re-cert  Decreased functional activity tolerance - Plan: PT plan of care cert/re-cert  Difficulty walking -  Plan: PT plan of care cert/re-cert      Subjective Assessment - 10/09/14 1523    Subjective As long as patient is sitting, she is fine; if she is doing any sort of activity, even washing dishes at sink or light cleaning, she is breathing so heavy she has to sit down. Can have back pain with certain movements, also has a tendency to get many muscle cramps..   Pertinent History Cancer was diagnosed in November of 2012; did not undergo chemo or radiation because diagnosis was made early. [facility]. History of lung lobectomy; also has around 50-60% of lung capacity. Low back pain when she is moving some times.     How long can you stand comfortably? short period of time- 5 minutes  due to back    How long can you walk comfortably? short period- 5 minutes due to back    Currently in Pain? No/denies            [facility] PT Assessment - 10/09/14 0001    Assessment   Medical Diagnosis carcinoma of L female breast    Onset Date/Surgical Date --  2012, surgery in 2013 and 2015   Next MD Visit July 2016 Dr. Buelah Manis; [facility] December 2016   Precautions   Precautions Other (comment)   Precaution Comments has about 50-60% of lung capacity    Restrictions   Weight Bearing Restrictions No   Balance Screen   Has the patient fallen in the past 6 months Yes   How many times? 1- was trying to put bag of potting soil in cart at [facility] the patient had a decrease in activity level because of a fear of falling?  Yes   Is the patient reluctant to leave their home because of a fear of falling?  No   Prior Function   Level of Independence Independent;Independent with gait;Independent with transfers   [employer] work;Other (comment);Retired  volunteers 3 days a week    Leisure crochet, reading, some gardening    Observation/Other Assessments   Observations TUG 10, 9.40, 9.38; SLS R 7 seconds, SLS L 30 seconds    Focus on Therapeutic Outcomes (FOTO)  FACIT-F 108.3; Fatigue VAS 6/8/?; Pain VAS 2/9/6; Distress VAS 1/4/2   Posture/Postural Control   Posture Comments flat spinal curves with forward head, varus knees, pronated feet with orthotics, possible LLD    AROM   Right Hip External Rotation  --  WNL    Right Hip Internal Rotation  38   Left Hip External Rotation  --  WNL    Left Hip Internal Rotation  35   Right Ankle Dorsiflexion 20   Left Ankle Dorsiflexion 20   Strength   Overall Strength Comments Upper and lower abs approx 2/5   Right Hip Flexion 3/5   Right Hip Extension 3/5   Right Hip ABduction 3/5   Left Hip Flexion 3/5   Left Hip Extension 3/5   Left Hip ABduction 2+/5   Right Knee Flexion 4-/5   Right Knee Extension 4/5   Left Knee Flexion  4-/5   Left Knee Extension 4/5   Right Ankle Dorsiflexion 4+/5   Left Ankle Dorsiflexion 4+/5   Ambulation/Gait   Gait Comments significant pronation, proximal muscle weakness, reduced pelvic and trunk rotation, poor posture, reduced gait speed ; 6 minute walk 1066f  PT Education - 10/09/14 1619    Education provided Yes   Education Details prognosis, plan of care moving forward, HEP    Person(s) Educated Patient   Methods Explanation;Demonstration;Handout   Comprehension Verbalized understanding;Need further instruction          PT Short Term Goals - 10/09/14 1623    PT SHORT TERM GOAL #1   Title Patient will demonstrate at least 4+/5 muscle strength in bilateral lower extremities and at least 4/5 muscle strength in proximal musculature    Time 4   Period Weeks   Status New   PT SHORT TERM GOAL #2   Title Patient will be to ambulate at least 1489f during 6 minute walk test today with reduced fatigue and increased gait speed    Time 4   Period Weeks   Status New   PT SHORT TERM GOAL #3   Title Patient will be able to verbalize the importance of good posture and will be able to maintain correct posture during all functional tasks and activities at least 90% of the time    Time 4   Period Weeks   Status New   PT SHORT TERM GOAL #4   Title Patient will consistently and correctly perform appropriate HEP to be advanced PRN    Time 4   Period Weeks   Status New           PT Long Term Goals - 10/09/14 1625    PT LONG TERM GOAL #1   Title Patient will be demonstrate improved gait mechanics including improved gait speed, improved trunk and pelvic rotation, improved posture, and reduced overall pain during a gait period of at least 15 minutes    Time 8   Period Weeks   Status New   PT LONG TERM GOAL #2   Title Patient will improve FACIT-F score to at least 135   Time 8   Period Weeks   Status New   PT LONG TERM GOAL #3    Title Patient will demonstrate the ability to complete Berg balance test with a score of at least 52 to evidence reduced risk of falls, and will also be able to maintain SLS for at least 45 seconds each LE with no HHA    Time 8   Period Weeks   Status New   PT LONG TERM GOAL #4   Title Patient to state she has been able to increase the amount of standing activities she is able to 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 The patient states that she is limited to about 5 minutes of static stance and activity before she begins to have pain in her low back and hips; however she does state she is currently seeing a chiropractor for her low back. She also states that she currently uses a scooter if she needs to walk any sort of long distance. Patient will benefit from skilled PT services in order to address tehse defiicts and assist her in reaching an  optimal level of function.    Pt will benefit from skilled therapeutic intervention in order to improve on the following deficits Abnormal gait;Decreased endurance;Cardiopulmonary status limiting activity;Obesity;Decreased activity tolerance;Decreased strength;Pain;Decreased balance;Decreased mobility;Difficulty walking;Decreased coordination;Postural dysfunction   Rehab Potential Good   PT Frequency 2x / week   PT Duration 8 weeks   PT Treatment/Interventions ADLs/Self Care Home Management;Gait training;Stair training;Functional mobility training;Therapeutic activities;Therapeutic exercise;Balance training;Neuromuscular  re-education;Patient/family education;Energy conservation   PT Next Visit Plan review HEP and goals; functional stretching, strengthening, balance tasks, functional activity tolerance    PT Home Exercise Plan given    Consulted and Agree with Plan of Care Patient          G-Codes - 10/13/14 1629    Functional Assessment Tool Used Based on skilled clinical assessment of strength, posture, gait, pain, functional activity tolerance, balance    Functional Limitation Mobility: Walking and moving around   Mobility: Walking and Moving Around Current Status [phone]) At least 80 percent but less than 100 percent impaired, limited or restricted   Mobility: Walking and Moving Around Goal Status [phone]) At least 60 percent but less than 80 percent impaired, limited or restricted       Problem List Patient Active Problem List   Diagnosis Date Noted  . Leg cramps 08/02/2014  . Ganglion cyst of flexor tendon sheath of finger of left hand 08/02/2014  . Elevated LFTs 04/28/2014  . Skin irritation 01/19/2014  . Post-operative state 11/14/2013  . Palpitations 10/12/2013  . Atypical ductal hyperplasia of left breast 07/12/2013  . Endometrial polyp 06/25/2013  . Rectocele 06/25/2013  . Diabetic neuropathy 05/09/2013  . Pre-ulcerative calluses 05/09/2013  . Pelvic pain 05/09/2013  . Bladder prolapse, female, acquired 05/09/2013  . Pain, joint, multiple sites 03/26/2013  . 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 Insomnia 11/29/2012  . Hip pain 09/08/2012  . Occupational therapy encounter 08/30/2012  . Muscle weakness (generalized) 06/26/2012  . Rotator cuff syndrome of left shoulder 06/21/2012  . Pain in joint, shoulder region 06/16/2012  . Peripheral edema 02/04/2012  . HSV (herpes simplex virus) infection 12/09/2011  . Dyspnea 11/15/2011  . 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 Essential hypertension, benign 10/11/2011  . DM (diabetes mellitus)  10/11/2011  . OA (osteoarthritis) 10/11/2011  . Morbidly obese 10/11/2011  . Asthma 10/11/2011  . Hyperlipidemia 10/11/2011  . Chronic fatigue 10/11/2011  . OSA (obstructive sleep apnea) 10/11/2011    Deniece Ree PT, DPT [place] [address] [place], [place], [address] Phone: [phone]   Fax:  [phone]

## 2014-10-11 ENCOUNTER — Ambulatory Visit (HOSPITAL_COMMUNITY): Payer: Medicare Other | Admitting: Physical Therapy

## 2014-10-11 DIAGNOSIS — R269 Unspecified abnormalities of gait and mobility: Secondary | ICD-10-CM | POA: Diagnosis not present

## 2014-10-11 DIAGNOSIS — R262 Difficulty in walking, not elsewhere classified: Secondary | ICD-10-CM

## 2014-10-11 DIAGNOSIS — R2681 Unsteadiness on feet: Secondary | ICD-10-CM

## 2014-10-11 DIAGNOSIS — M6289 Other specified disorders of muscle: Secondary | ICD-10-CM | POA: Diagnosis not present

## 2014-10-11 DIAGNOSIS — Z853 Personal history of malignant neoplasm of breast: Secondary | ICD-10-CM

## 2014-10-11 DIAGNOSIS — R29898 Other symptoms and signs involving the musculoskeletal system: Secondary | ICD-10-CM | POA: Diagnosis not present

## 2014-10-11 DIAGNOSIS — R293 Abnormal posture: Secondary | ICD-10-CM | POA: Diagnosis not present

## 2014-10-11 DIAGNOSIS — M6281 Muscle weakness (generalized): Secondary | ICD-10-CM

## 2014-10-11 DIAGNOSIS — R6889 Other general symptoms and signs: Secondary | ICD-10-CM

## 2014-10-11 NOTE — Therapy (Signed)
[place], [place], [address] Phone: [phone]   Fax:  [phone]  Physical Therapy Treatment  Patient Details  Name: Julie Clay MRN: [id number] Date of Birth: December 15, 1940 Referring Provider:  Alycia Rossetti, MD  Encounter Date: 10/11/2014      PT End of Session - 10/11/14 1109    Visit Number 2   Number of Visits 16   Date for PT Re-Evaluation 11/06/14   Authorization Type Medicare/BCBS   Authorization Time Period 10/09/14 to 12/09/14   Authorization - Visit Number 2   Authorization - Number of Visits 10   PT Start Time 1016   PT Stop Time 1102   PT Time Calculation (min) 46 min   Activity Tolerance Patient tolerated treatment well;Patient limited by fatigue   Behavior During Therapy [facility] for tasks assessed/performed      Past Medical History  Diagnosis Date  . Hypertension   . Hyperlipidemia   . Diabetes mellitus   . Asthma     spirometry (2011)- mild ventilary defect  . Cancer 2012    Breast  . Arthritis     Osteoarthritis  . Fatty liver 2013    enlarged  . Sliding hiatal hernia   . Ventral hernia, unspecified, without mention of obstruction or gangrene   . Lung nodule seen on imaging study 2013  . HSV (herpes simplex virus) infection   . Lymphedema     Left arm  . 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 Calcification of left breast   . Arthritis   . Shortness of breath     with exertion  . Sleep apnea     Stop Bang score of 5. Pt has had sleep study, but was shown to be negative for sleep apnea.  Marland Kitchen Dysrhythmia     palpatations    Past Surgical History  Procedure Laterality Date  . Cesarean section    . 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 Breast surgery  jan 2013  . Hand ligament reconstruction Right   . Cataract extraction, bilateral      on different occassions   . Shoulder arthroscopy with open rotator cuff repair Left 07/26/2012  .  Breast biopsy  06/22/13  . Breast biopsy Left 09/12/2013    Procedure: BREAST BIOPSY WITH NEEDLE LOCALIZATION;  Surgeon: Jamesetta So, MD;  Location: [facility];  Service: General;  Laterality: Left;  . Hysteroscopy w/d&c N/A 11/06/2013    Procedure: DILATATION AND CURETTAGE /HYSTEROSCOPY;  Surgeon: Jonnie Kind, MD;  Location: [facility];  Service: Gynecology;  Laterality: N/A;  . Polypectomy N/A 11/06/2013    Procedure: POLYPECTOMY (REMOVAL ENDOMETRIAL POLYP);  Surgeon: Jonnie Kind, MD;  Location: [facility];  Service: Gynecology;  Laterality: N/A;  . Rectocele repair N/A 11/06/2013    Procedure: POSTERIOR REPAIR (RECTOCELE);  Surgeon: Jonnie Kind, MD;  Location: [facility];  Service: Gynecology;  Laterality: N/A;    There were no vitals filed for this visit.  Visit Diagnosis:  History of breast cancer in female  Weakness of both lower extremities  Proximal muscle weakness  Unsteadiness  Poor posture  Decreased functional activity tolerance  Difficulty walking      Subjective Assessment - 10/11/14 1022    Subjective Patient states she is doing OK today, felt fine after evaluation session. But also states that her back is still not 100%, still feels like its pulling. As patient  progressed stated she began to feel clammy, stated taht this may be due to her sugars being high; MDs are aware of this and already are treating her with multiple medicines.   Pertinent History Cancer was diagnosed in November of 2012; did not undergo chemo or radiation because diagnosis was made early. [facility]. History of lung lobectomy; also has around 50-60% of lung capacity. Low back pain when she is moving some times.     Currently in Pain? No/denies                         [facility] Adult PT Treatment/Exercise - 10/11/14 0001    Knee/Hip Exercises: Aerobic   Stationary Bike Nustep seat 10 level zero 5 minutes    Knee/Hip Exercises: Standing   Heel Raises  Both;1 set;15 reps   Other Standing Knee Exercises Sit to stand with eccentric lower 1x6   Other Standing Knee Exercises Hip ABD walks 2x32f ; 3D hip excursions 1x10   Knee/Hip Exercises: Supine   Bridges Both;1 set;15 reps   Bridges Limitations standard form              Balance Exercises - 10/11/14 1025    Balance Exercises: Standing   Standing Eyes Closed Narrow base of support (BOS);Solid surface;3 reps;20 secs   Tandem Stance Eyes open;3 reps;15 secs   SLS Eyes open;Solid surface;2 reps   Wall Bumps --  posterior wall bumps 1x20, min guard            PT Education - 10/11/14 1108    Education provided Yes   Education Details education regarding blood sugars and exercise; balance; reviewed goals and provided patient with copy of initial eval    Person(s) Educated Patient   Methods Explanation   Comprehension Verbalized understanding          PT Short Term Goals - 10/09/14 1623    PT SHORT TERM GOAL #1   Title Patient will demonstrate at least 4+/5 muscle strength in bilateral lower extremities and at least 4/5 muscle strength in proximal musculature    Time 4   Period Weeks   Status New   PT SHORT TERM GOAL #2   Title Patient will be to ambulate at least 14015fduring 6 minute walk test today with reduced fatigue and increased gait speed    Time 4   Period Weeks   Status New   PT SHORT TERM GOAL #3   Title Patient will be able to verbalize the importance of good posture and will be able to maintain correct posture during all functional tasks and activities at least 90% of the time    Time 4   Period Weeks   Status New   PT SHORT TERM GOAL #4   Title Patient will consistently and correctly perform appropriate HEP to be advanced PRN    Time 4   Period Weeks   Status New           PT Long Term Goals - 10/09/14 1625    PT LONG TERM GOAL #1   Title Patient will be demonstrate improved gait mechanics including improved gait speed, improved trunk and  pelvic rotation, improved posture, and reduced overall pain during a gait period of at least 15 minutes    Time 8   Period Weeks   Status New   PT LONG TERM GOAL #2   Title Patient will improve FACIT-F score to  at least 135   Time 8   Period Weeks   Status New   PT LONG TERM GOAL #3   Title Patient will demonstrate the ability to complete Berg balance test with a score of at least 52 to evidence reduced risk of falls, and will also be able to maintain SLS for at least 45 seconds each LE with no HHA    Time 8   Period Weeks   Status New   PT LONG TERM GOAL #4   Title Patient to state she has been able to increase the amount of standing activities 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 Patient was somewhat limited by fatigue today espeically due to mild symptoms from possibly high blood sugars, patient  also stated that shefelt like she had some missed heartbeats today- patietn states that MDs are aware of both issues and she has been managing them OK at home. Patient closely monitored throughout session and was provided with rest breaks PRN today.    Pt will benefit from skilled therapeutic intervention in order to improve on the following deficits Abnormal gait;Decreased endurance;Cardiopulmonary status limiting activity;Obesity;Decreased activity tolerance;Decreased strength;Pain;Decreased balance;Decreased mobility;Difficulty walking;Decreased coordination;Postural dysfunction   Rehab Potential Good   PT Frequency 2x / week   PT Duration 8 weeks   PT Treatment/Interventions ADLs/Self Care Home Management;Gait training;Stair  training;Functional mobility training;Therapeutic activities;Therapeutic exercise;Balance training;Neuromuscular re-education;Patient/family education;Energy conservation   PT Next Visit Plan review HEP and goals; functional stretching, strengthening, balance tasks, functional activity tolerance    PT Home Exercise Plan given    Consulted and Agree with Plan of Care Patient        Problem List Patient Active Problem List   Diagnosis Date Noted  . Leg cramps 08/02/2014  . Ganglion cyst of flexor tendon sheath of finger of left hand 08/02/2014  . Elevated LFTs 04/28/2014  . Skin irritation 01/19/2014  . Post-operative state 11/14/2013  . Palpitations 10/12/2013  . Atypical ductal hyperplasia of left breast 07/12/2013  . Endometrial polyp 06/25/2013  . Rectocele 06/25/2013  . Diabetic neuropathy 05/09/2013  . Pre-ulcerative calluses 05/09/2013  . Pelvic pain 05/09/2013  . Bladder prolapse, female, acquired 05/09/2013  . Pain, joint, multiple sites 03/26/2013  . 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 Insomnia 11/29/2012  . Hip pain 09/08/2012  . Occupational therapy encounter 08/30/2012  . Muscle weakness (generalized) 06/26/2012  . Rotator cuff syndrome of left shoulder 06/21/2012  . Pain in joint, shoulder region 06/16/2012  . Peripheral edema 02/04/2012  . HSV (herpes simplex virus) infection 12/09/2011  . Dyspnea 11/15/2011  . 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 Essential hypertension, benign 10/11/2011  . DM (diabetes mellitus) 10/11/2011  . OA (osteoarthritis) 10/11/2011  . Morbidly obese 10/11/2011  . Asthma 10/11/2011  . Hyperlipidemia 10/11/2011  . Chronic fatigue 10/11/2011  . OSA (obstructive sleep apnea) 10/11/2011    Deniece Ree PT, DPT [place] [address] [place], [place], [address] Phone: [phone]   Fax:  [phone]

## 2014-10-14 ENCOUNTER — Ambulatory Visit (HOSPITAL_COMMUNITY): Payer: Medicare Other | Admitting: Physical Therapy

## 2014-10-14 DIAGNOSIS — R262 Difficulty in walking, not elsewhere classified: Secondary | ICD-10-CM

## 2014-10-14 DIAGNOSIS — R293 Abnormal posture: Secondary | ICD-10-CM | POA: Diagnosis not present

## 2014-10-14 DIAGNOSIS — R2681 Unsteadiness on feet: Secondary | ICD-10-CM

## 2014-10-14 DIAGNOSIS — M6289 Other specified disorders of muscle: Secondary | ICD-10-CM | POA: Diagnosis not present

## 2014-10-14 DIAGNOSIS — R29898 Other symptoms and signs involving the musculoskeletal system: Secondary | ICD-10-CM | POA: Diagnosis not present

## 2014-10-14 DIAGNOSIS — M6281 Muscle weakness (generalized): Secondary | ICD-10-CM

## 2014-10-14 DIAGNOSIS — Z853 Personal history of malignant neoplasm of breast: Secondary | ICD-10-CM

## 2014-10-14 DIAGNOSIS — R269 Unspecified abnormalities of gait and mobility: Secondary | ICD-10-CM | POA: Diagnosis not present

## 2014-10-14 DIAGNOSIS — R6889 Other general symptoms and signs: Secondary | ICD-10-CM

## 2014-10-14 NOTE — Therapy (Signed)
[place], [place], [address] Phone: [phone]   Fax:  [phone]  Physical Therapy Treatment  Patient Details  Name: Julie Clay MRN: [id number] Date of Birth: August 06, 1940 Referring Provider:  Alycia Rossetti, MD  Encounter Date: 10/14/2014      PT End of Session - 10/14/14 1602    Visit Number 3   Number of Visits 16   Date for PT Re-Evaluation 11/06/14   Authorization Type Medicare/BCBS   Authorization Time Period 10/09/14 to 12/09/14   Authorization - Visit Number 3   Authorization - Number of Visits 10   PT Start Time 1513   PT Stop Time 1600   PT Time Calculation (min) 47 min   Activity Tolerance Patient tolerated treatment well   Behavior During Therapy [facility] for tasks assessed/performed      Past Medical History  Diagnosis Date  . Hypertension   . Hyperlipidemia   . Diabetes mellitus   . Asthma     spirometry (2011)- mild ventilary defect  . Cancer 2012    Breast  . Arthritis     Osteoarthritis  . Fatty liver 2013    enlarged  . Sliding hiatal hernia   . Ventral hernia, unspecified, without mention of obstruction or gangrene   . Lung nodule seen on imaging study 2013  . HSV (herpes simplex virus) infection   . Lymphedema     Left arm  . 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 Calcification of left breast   . Arthritis   . Shortness of breath     with exertion  . Sleep apnea     Stop Bang score of 5. Pt has had sleep study, but was shown to be negative for sleep apnea.  Marland Kitchen Dysrhythmia     palpatations    Past Surgical History  Procedure Laterality Date  . Cesarean section    . 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 Breast surgery  jan 2013  . Hand ligament reconstruction Right   . Cataract extraction, bilateral      on different occassions   . Shoulder arthroscopy with open rotator cuff repair Left 07/26/2012  . Breast biopsy  06/22/13  .  Breast biopsy Left 09/12/2013    Procedure: BREAST BIOPSY WITH NEEDLE LOCALIZATION;  Surgeon: Jamesetta So, MD;  Location: [facility];  Service: General;  Laterality: Left;  . Hysteroscopy w/d&c N/A 11/06/2013    Procedure: DILATATION AND CURETTAGE /HYSTEROSCOPY;  Surgeon: Jonnie Kind, MD;  Location: [facility];  Service: Gynecology;  Laterality: N/A;  . Polypectomy N/A 11/06/2013    Procedure: POLYPECTOMY (REMOVAL ENDOMETRIAL POLYP);  Surgeon: Jonnie Kind, MD;  Location: [facility];  Service: Gynecology;  Laterality: N/A;  . Rectocele repair N/A 11/06/2013    Procedure: POSTERIOR REPAIR (RECTOCELE);  Surgeon: Jonnie Kind, MD;  Location: [facility];  Service: Gynecology;  Laterality: N/A;    There were no vitals filed for this visit.  Visit Diagnosis:  History of breast cancer in female  Weakness of both lower extremities  Proximal muscle weakness  Unsteadiness  Poor posture  Decreased functional activity tolerance  Difficulty walking      Subjective Assessment - 10/14/14 1515    Subjective Patient reports she is doing well today, had a good weekend and felt fine after last session    Pertinent History Cancer was diagnosed in November of 2012; did not undergo  chemo or radiation because diagnosis was made early. [facility]. History of lung lobectomy; also has around 50-60% of lung capacity. Low back pain when she is moving some times.     Currently in Pain? Yes   Pain Score 3    Pain Location Back                         [facility] Adult PT Treatment/Exercise - 10/14/14 0001    Knee/Hip Exercises: Stretches   Active Hamstring Stretch Both;30 seconds;2 reps   Active Hamstring Stretch Limitations 12 inch box    Passive Hamstring Stretch Both;3 reps;30 seconds   Passive Hamstring Stretch Limitations gastroc    Piriformis Stretch 30 seconds;2 reps   Piriformis Stretch Limitations seated    Knee/Hip Exercises: Aerobic   Stationary Bike  Nustep seat 10 level zero for 8 minutes  minutes    Knee/Hip Exercises: Standing   Forward Lunges Both;1 set;10 reps   Forward Lunges Limitations 6 inch box    Other Standing Knee Exercises 3D hip excursions 1x10   Other Standing Knee Exercises Hip ABD with 2# 1x10; hip flexion 1x20 with 2#              Balance Exercises - 10/14/14 1517    Balance Exercises: Standing   Standing Eyes Closed Narrow base of support (BOS);Foam/compliant surface;3 reps;20 secs   Tandem Stance Eyes open;Foam/compliant surface;3 reps;15 secs   Wall Bumps --  posterior wall bumps 1x20, eyes closed    Rockerboard Other (comment)  x20AP, x20 lateral no HHA            PT Education - 10/14/14 1602    Education provided No          PT Short Term Goals - 10/09/14 1623    PT SHORT TERM GOAL #1   Title Patient will demonstrate at least 4+/5 muscle strength in bilateral lower extremities and at least 4/5 muscle strength in proximal musculature    Time 4   Period Weeks   Status New   PT SHORT TERM GOAL #2   Title Patient will be to ambulate at least 1415f during 6 minute walk test today with reduced fatigue and increased gait speed    Time 4   Period Weeks   Status New   PT SHORT TERM GOAL #3   Title Patient will be able to verbalize the importance of good posture and will be able to maintain correct posture during all functional tasks and activities at least 90% of the time    Time 4   Period Weeks   Status New   PT SHORT TERM GOAL #4   Title Patient will consistently and correctly perform appropriate HEP to be advanced PRN    Time 4   Period Weeks   Status New           PT Long Term Goals - 10/09/14 1625    PT LONG TERM GOAL #1   Title Patient will be demonstrate improved gait mechanics including improved gait speed, improved trunk and pelvic rotation, improved posture, and reduced overall pain during a gait period of at least 15 minutes    Time 8   Period Weeks   Status New   PT  LONG TERM GOAL #2   Title Patient will improve FACIT-F score to at least 135   Time 8   Period Weeks   Status New   PT  LONG TERM GOAL #3   Title Patient will demonstrate the ability to complete Berg balance test with a score of at least 52 to evidence reduced risk of falls, and will also be able to maintain SLS for at least 45 seconds each LE with no HHA    Time 8   Period Weeks   Status New   PT LONG TERM GOAL #4   Title Patient to state she has been able to increase the amount of 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 Provided rest breaks PRN as patient was experiencing pain in low back and does have hip/knee pain with extended standing, also began to feel clammy (again, possibly related to blood sugars and MD is already aware) with extended activity. Patient monitored closely throughout session today but appeared to respond well to graded activity today with rest breaks as needed.    Pt will benefit from skilled therapeutic intervention in order to improve on the following deficits Abnormal gait;Decreased endurance;Cardiopulmonary status limiting activity;Obesity;Decreased activity tolerance;Decreased strength;Pain;Decreased balance;Decreased mobility;Difficulty walking;Decreased coordination;Postural dysfunction   Rehab Potential Good   PT Frequency 2x / week   PT Duration 8 weeks   PT Treatment/Interventions ADLs/Self Care Home Management;Gait training;Stair training;Functional mobility training;Therapeutic activities;Therapeutic exercise;Balance training;Neuromuscular re-education;Patient/family education;Energy conservation   PT Next Visit Plan  review HEP and goals; functional stretching, strengthening, balance tasks, functional activity tolerance    PT Home Exercise Plan given    Consulted and Agree with Plan of Care Patient        Problem List Patient Active Problem List   Diagnosis Date Noted  . Leg cramps 08/02/2014  . Ganglion cyst of flexor tendon sheath of finger of left hand 08/02/2014  . Elevated LFTs 04/28/2014  . Skin irritation 01/19/2014  . Post-operative state 11/14/2013  . Palpitations 10/12/2013  . Atypical ductal hyperplasia of left breast 07/12/2013  . Endometrial polyp 06/25/2013  . Rectocele 06/25/2013  . Diabetic neuropathy 05/09/2013  . Pre-ulcerative calluses 05/09/2013  . Pelvic pain 05/09/2013  . Bladder prolapse, female, acquired 05/09/2013  . Pain, joint, multiple sites 03/26/2013  . 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 Insomnia 11/29/2012  . Hip pain 09/08/2012  . Occupational therapy encounter 08/30/2012  . Muscle weakness (generalized) 06/26/2012  . Rotator cuff syndrome of left shoulder 06/21/2012  . Pain in joint, shoulder region 06/16/2012  . Peripheral edema 02/04/2012  . HSV (herpes simplex virus) infection 12/09/2011  . Dyspnea 11/15/2011  . 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 Essential hypertension, benign 10/11/2011  . DM (diabetes mellitus) 10/11/2011  . OA (osteoarthritis) 10/11/2011  . Morbidly obese 10/11/2011  . Asthma 10/11/2011  . Hyperlipidemia 10/11/2011  . Chronic fatigue 10/11/2011  . OSA (obstructive sleep apnea) 10/11/2011    Deniece Ree PT, DPT [facility] [address] [place], [place], [address] Phone: [phone]   Fax:  [phone]

## 2014-10-18 ENCOUNTER — Ambulatory Visit (HOSPITAL_COMMUNITY): Payer: Medicare Other | Admitting: Physical Therapy

## 2014-10-23 ENCOUNTER — Ambulatory Visit (HOSPITAL_COMMUNITY): Payer: Medicare Other | Attending: Hematology & Oncology

## 2014-10-23 DIAGNOSIS — R29898 Other symptoms and signs involving the musculoskeletal system: Secondary | ICD-10-CM | POA: Insufficient documentation

## 2014-10-23 DIAGNOSIS — M6289 Other specified disorders of muscle: Secondary | ICD-10-CM | POA: Insufficient documentation

## 2014-10-23 DIAGNOSIS — Z5189 Encounter for other specified aftercare: Secondary | ICD-10-CM | POA: Insufficient documentation

## 2014-10-23 DIAGNOSIS — R293 Abnormal posture: Secondary | ICD-10-CM | POA: Insufficient documentation

## 2014-10-23 DIAGNOSIS — R262 Difficulty in walking, not elsewhere classified: Secondary | ICD-10-CM | POA: Diagnosis not present

## 2014-10-23 DIAGNOSIS — R2681 Unsteadiness on feet: Secondary | ICD-10-CM | POA: Insufficient documentation

## 2014-10-23 DIAGNOSIS — M6281 Muscle weakness (generalized): Secondary | ICD-10-CM

## 2014-10-23 DIAGNOSIS — Z853 Personal history of malignant neoplasm of breast: Secondary | ICD-10-CM | POA: Diagnosis not present

## 2014-10-23 DIAGNOSIS — IMO0001 Reserved for inherently not codable concepts without codable children: Secondary | ICD-10-CM

## 2014-10-23 DIAGNOSIS — R6889 Other general symptoms and signs: Secondary | ICD-10-CM | POA: Diagnosis not present

## 2014-10-23 NOTE — Therapy (Signed)
[place], [place], [address] Phone: [phone]   Fax:  [phone]  Physical Therapy Treatment  Patient Details  Name: Julie Clay MRN: [id number] Date of Birth: 12-14-1940 Referring Provider:  Patrici Ranks, MD  Encounter Date: 10/23/2014      PT End of Session - 10/23/14 1608    Visit Number 4   Number of Visits 16   Date for PT Re-Evaluation 11/06/14   Authorization Type Medicare/BCBS   Authorization Time Period 10/09/14 to 12/09/14   Authorization - Visit Number 4   Authorization - Number of Visits 10   PT Start Time 9798   PT Stop Time 1652   PT Time Calculation (min) 46 min   Equipment Utilized During Treatment Gait belt   Activity Tolerance Patient tolerated treatment well   Behavior During Therapy [facility] for tasks assessed/performed      Past Medical History  Diagnosis Date  . Hypertension   . Hyperlipidemia   . Diabetes mellitus   . Asthma     spirometry (2011)- mild ventilary defect  . Cancer 2012    Breast  . Arthritis     Osteoarthritis  . Fatty liver 2013    enlarged  . Sliding hiatal hernia   . Ventral hernia, unspecified, without mention of obstruction or gangrene   . Lung nodule seen on imaging study 2013  . HSV (herpes simplex virus) infection   . Lymphedema     Left arm  . 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 Calcification of left breast   . Arthritis   . Shortness of breath     with exertion  . Sleep apnea     Stop Bang score of 5. Pt has had sleep study, but was shown to be negative for sleep apnea.  Marland Kitchen Dysrhythmia     palpatations    Past Surgical History  Procedure Laterality Date  . Cesarean section    . 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 Breast surgery  jan 2013  . Hand ligament reconstruction Right   . Cataract extraction, bilateral      on different occassions   . Shoulder arthroscopy with open rotator cuff  repair Left 07/26/2012  . Breast biopsy  06/22/13  . Breast biopsy Left 09/12/2013    Procedure: BREAST BIOPSY WITH NEEDLE LOCALIZATION;  Surgeon: Jamesetta So, MD;  Location: [facility];  Service: General;  Laterality: Left;  . Hysteroscopy w/d&c N/A 11/06/2013    Procedure: DILATATION AND CURETTAGE /HYSTEROSCOPY;  Surgeon: Jonnie Kind, MD;  Location: [facility];  Service: Gynecology;  Laterality: N/A;  . Polypectomy N/A 11/06/2013    Procedure: POLYPECTOMY (REMOVAL ENDOMETRIAL POLYP);  Surgeon: Jonnie Kind, MD;  Location: [facility];  Service: Gynecology;  Laterality: N/A;  . Rectocele repair N/A 11/06/2013    Procedure: POSTERIOR REPAIR (RECTOCELE);  Surgeon: Jonnie Kind, MD;  Location: [facility];  Service: Gynecology;  Laterality: N/A;    There were no vitals filed for this visit.  Visit Diagnosis:  History of breast cancer in female  Weakness of both lower extremities  Proximal muscle weakness  Unsteadiness  Poor posture  Decreased functional activity tolerance  Difficulty walking  Occupational therapy encounter      Subjective Assessment - 10/23/14 1606    Subjective Pt stated she is feeling better today, no reports of pain today.   Currently in Pain? No/denies  [facility] Adult PT Treatment/Exercise - 10/23/14 0001    Knee/Hip Exercises: Aerobic   Stationary Bike Nustep seat 10 level 3 for 10 minutes   Knee/Hip Exercises: Standing   Forward Lunges Both;1 set;10 reps   Forward Lunges Limitations 6 inch box    Hip Abduction Both;10 reps   Abduction Limitations 2#   Hip Extension Both;10 reps   Extension Limitations 2#   Rocker Board 2 minutes   Rocker Board Limitations R/L and A/P   SLS Lt 17", Rt 7" max of 3   Other Standing Knee Exercises 3D hip excursions 1x10   Other Standing Knee Exercises marching 20x 2#             Balance Exercises - 10/23/14 1648    Balance Exercises: Standing   Tandem Stance Eyes open;3 reps;30 secs   Tandem Gait 1 rep    Retro Gait 1 rep   Sidestepping 1 rep;Theraband  Red           PT Education - 10/23/14 1614    Education provided Yes   Education Details Educated on [employer] and pedometer   Person(s) Educated Patient   Methods Explanation;Demonstration   Comprehension Verbalized understanding          PT Short Term Goals - 10/23/14 1608    PT SHORT TERM GOAL #1   Title Patient will demonstrate at least 4+/5 muscle strength in bilateral lower extremities and at least 4/5 muscle strength in proximal musculature    Status On-going   PT SHORT TERM GOAL #2   Title Patient will be to ambulate at least 1411f during 6 minute walk test today with reduced fatigue and increased gait speed    PT SHORT TERM GOAL #3   Title Patient will be able to verbalize the importance of good posture and will be able to maintain correct posture during all functional tasks and activities at least 90% of the time    PT SHORT TERM GOAL #4   Title Patient will consistently and correctly perform appropriate HEP to be advanced PRN    Status On-going           PT Long Term Goals - 10/09/14 1625    PT LONG TERM GOAL #1   Title Patient will be demonstrate improved gait mechanics including improved gait speed, improved trunk and pelvic rotation, improved posture, and reduced overall pain during a gait period of at least 15 minutes    Time 8   Period Weeks   Status New   PT LONG TERM GOAL #2   Title Patient will improve FACIT-F score to at least 135   Time 8   Period Weeks   Status New   PT LONG TERM GOAL #3   Title Patient will demonstrate the ability to complete Berg balance test with a score of at least 52 to evidence reduced risk of falls, and will also be able to maintain SLS for at least 45 seconds each LE with no HHA    Time 8   Period Weeks   Status New   PT LONG TERM GOAL #4   Title Patient to state she has been able to increase the amount of standing activities she is able 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  Pt stated she has been taken her blood sugar medications as perscribed and has had no episodes lately, stated she continues to feel clammy with extended activity (MD is already aware).  Session focus on improving LE strenthening, balance and improving activity tolerance per pt. tolerance.  MIn assistance required with LOB episodes during tandem stance and gait, SBA with all balance activities. Rest breaks were taken through session PRN.  No reports of increased pain through session.   PT Next Visit Plan review HEP and goals; functional stretching, strengthening, balance tasks, functional activity tolerance         Problem List Patient Active Problem List   Diagnosis Date Noted  . Leg cramps 08/02/2014  . Ganglion cyst of flexor tendon sheath of finger of left hand 08/02/2014  . Elevated LFTs 04/28/2014  . Skin irritation 01/19/2014  . Post-operative state 11/14/2013  . Palpitations 10/12/2013  . Atypical ductal hyperplasia of left breast 07/12/2013  . Endometrial polyp 06/25/2013  . Rectocele 06/25/2013  . Diabetic neuropathy 05/09/2013  . Pre-ulcerative calluses 05/09/2013  . Pelvic pain 05/09/2013  . Bladder prolapse, female, acquired 05/09/2013  . Pain, joint, multiple sites 03/26/2013  . 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 Insomnia 11/29/2012  . Hip pain 09/08/2012  . Occupational therapy encounter 08/30/2012  . Muscle weakness (generalized) 06/26/2012  . Rotator cuff syndrome of left shoulder 06/21/2012  . Pain in joint, shoulder region 06/16/2012  . Peripheral edema 02/04/2012  . HSV (herpes simplex virus) infection 12/09/2011  .  Dyspnea 11/15/2011  . 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 Essential hypertension, benign 10/11/2011  . DM (diabetes mellitus) 10/11/2011  . OA (osteoarthritis) 10/11/2011  . Morbidly obese 10/11/2011  . Asthma 10/11/2011  . Hyperlipidemia 10/11/2011  . Chronic fatigue 10/11/2011  . OSA (obstructive sleep apnea) 10/11/2011   Ihor Austin, LPTA; CBIS [phone]  Aldona Lento 10/23/2014, 4:49 PM  [place] [address] [place], [place], [address] Phone: [phone]   Fax:  [phone]

## 2014-10-25 ENCOUNTER — Ambulatory Visit (HOSPITAL_COMMUNITY): Payer: Medicare Other

## 2014-10-25 DIAGNOSIS — M6281 Muscle weakness (generalized): Secondary | ICD-10-CM

## 2014-10-25 DIAGNOSIS — R262 Difficulty in walking, not elsewhere classified: Secondary | ICD-10-CM

## 2014-10-25 DIAGNOSIS — R293 Abnormal posture: Secondary | ICD-10-CM

## 2014-10-25 DIAGNOSIS — R2681 Unsteadiness on feet: Secondary | ICD-10-CM | POA: Diagnosis not present

## 2014-10-25 DIAGNOSIS — R29898 Other symptoms and signs involving the musculoskeletal system: Secondary | ICD-10-CM

## 2014-10-25 DIAGNOSIS — Z853 Personal history of malignant neoplasm of breast: Secondary | ICD-10-CM

## 2014-10-25 DIAGNOSIS — M6289 Other specified disorders of muscle: Secondary | ICD-10-CM | POA: Diagnosis not present

## 2014-10-25 DIAGNOSIS — R6889 Other general symptoms and signs: Secondary | ICD-10-CM | POA: Diagnosis not present

## 2014-10-25 NOTE — Therapy (Signed)
[place], [place], [address] Phone: [phone]   Fax:  [phone]  Physical Therapy Treatment  Patient Details  Name: Julie Clay MRN: [id number] Date of Birth: 06/28/1940 Referring Provider:  Alycia Rossetti, MD  Encounter Date: 10/25/2014      PT End of Session - 10/25/14 1617    Visit Number 5   Number of Visits 16   Date for PT Re-Evaluation 11/06/14   Authorization Type Medicare/BCBS   Authorization Time Period 10/09/14 to 12/09/14   Authorization - Visit Number 5   Authorization - Number of Visits 10   PT Start Time 2951   PT Stop Time 1652   PT Time Calculation (min) 46 min   Equipment Utilized During Treatment Gait belt   Activity Tolerance Patient tolerated treatment well   Behavior During Therapy [facility] for tasks assessed/performed      Past Medical History  Diagnosis Date  . Hypertension   . Hyperlipidemia   . Diabetes mellitus   . Asthma     spirometry (2011)- mild ventilary defect  . Cancer 2012    Breast  . Arthritis     Osteoarthritis  . Fatty liver 2013    enlarged  . Sliding hiatal hernia   . Ventral hernia, unspecified, without mention of obstruction or gangrene   . Lung nodule seen on imaging study 2013  . HSV (herpes simplex virus) infection   . Lymphedema     Left arm  . 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 Calcification of left breast   . Arthritis   . Shortness of breath     with exertion  . Sleep apnea     Stop Bang score of 5. Pt has had sleep study, but was shown to be negative for sleep apnea.  Marland Kitchen Dysrhythmia     palpatations    Past Surgical History  Procedure Laterality Date  . Cesarean section    . 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 Breast surgery  jan 2013  . Hand ligament reconstruction Right   . Cataract extraction, bilateral      on different occassions   . Shoulder arthroscopy with open rotator cuff  repair Left 07/26/2012  . Breast biopsy  06/22/13  . Breast biopsy Left 09/12/2013    Procedure: BREAST BIOPSY WITH NEEDLE LOCALIZATION;  Surgeon: Jamesetta So, MD;  Location: [facility];  Service: General;  Laterality: Left;  . Hysteroscopy w/d&c N/A 11/06/2013    Procedure: DILATATION AND CURETTAGE /HYSTEROSCOPY;  Surgeon: Jonnie Kind, MD;  Location: [facility];  Service: Gynecology;  Laterality: N/A;  . Polypectomy N/A 11/06/2013    Procedure: POLYPECTOMY (REMOVAL ENDOMETRIAL POLYP);  Surgeon: Jonnie Kind, MD;  Location: [facility];  Service: Gynecology;  Laterality: N/A;  . Rectocele repair N/A 11/06/2013    Procedure: POSTERIOR REPAIR (RECTOCELE);  Surgeon: Jonnie Kind, MD;  Location: [facility];  Service: Gynecology;  Laterality: N/A;    There were no vitals filed for this visit.  Visit Diagnosis:  History of breast cancer in female  Weakness of both lower extremities  Proximal muscle weakness  Unsteadiness  Poor posture  Decreased functional activity tolerance  Difficulty walking      Subjective Assessment - 10/25/14 1603    Subjective Pt stated she tried to walk with pedometer but it didn't count correctly.  Pt stated she is out of cramp medication, husband going  to pick at a pharmacy today   Currently in Pain? No/denies              [facility] Adult PT Treatment/Exercise - 10/25/14 0001    Exercises   Exercises Knee/Hip   Knee/Hip Exercises: Aerobic   Stationary Bike 6 min walk complete today, too fatigued following   Knee/Hip Exercises: Standing   Heel Raises Both;1 set;15 reps   Heel Raises Limitations Toe raises 15x   Forward Lunges Both;1 set;10 reps   Forward Lunges Limitations 6 inch box    Lateral Step Up Both;10 reps;Hand Hold: 2;Step Height: 4"   Forward Step Up Both;10 reps;Hand Hold: 1;Step Height: 4"   SLS Lt 15" Rt 6" max   Gait Training 6 min walk total 763 feet, fatigue and c/o hip pain at 5 min in   Other Standing Knee Exercises 3D hip excursions  1x10             Balance Exercises - 10/25/14 1655    Balance Exercises: Standing   Tandem Stance Eyes open;Foam/compliant surface;2 reps;30 secs   SLS Eyes open;Solid surface;3 reps  Lt 15", Rt 6" max of 3   Tandem Gait 1 rep   Retro Gait 2 reps   Sidestepping 2 reps;Theraband  Red tband             PT Short Term Goals - 10/25/14 1618    PT SHORT TERM GOAL #1   Title Patient will demonstrate at least 4+/5 muscle strength in bilateral lower extremities and at least 4/5 muscle strength in proximal musculature    Status On-going   PT SHORT TERM GOAL #2   Title Patient will be to ambulate at least 1432f during 6 minute walk test today with reduced fatigue and increased gait speed    Status On-going   PT SHORT TERM GOAL #3   Title Patient will be able to verbalize the importance of good posture and will be able to maintain correct posture during all functional tasks and activities at least 90% of the time    Status On-going   PT SHORT TERM GOAL #4   Title Patient will consistently and correctly perform appropriate HEP to be advanced PRN    Baseline 10/25/2014: Every other day   Status On-going           PT Long Term Goals - 10/25/14 1619    PT LONG TERM GOAL #1   Title Patient will be demonstrate improved gait mechanics including improved gait speed, improved trunk and pelvic rotation, improved posture, and reduced overall pain during a gait period of at least 15 minutes    PT LONG TERM GOAL #2   Title Patient will improve FACIT-F score to at least 135   PT LONG TERM GOAL #3   Title Patient will demonstrate the ability to complete Berg balance test with a score of at least 52 to evidence reduced risk of falls, and will also be able to maintain SLS for at least 45 seconds each LE with no HHA    PT LONG TERM GOAL #4   Title Patient to state she has been able to increase the amount of 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  Progressed activity tolerance with standing strengthening, balance and gait activities.  Pt continues to require frequent rest breaks between exercises and c/o feeling clammy with exertion.  Progressed functional strenghtening with stair training, pt able to complete 4in step height without difficutly though c/o fatigue.  6 minute walk complete at end of session for total of 763 feet, pt fatigued at 5 minutes requiring seated rest breaks and c/o hip pain.  No reports of pain following sitting.   PT Next Visit Plan Functional stretching, strengthening, balance tasks on dynamic surface, functional activity tolerance         Problem List Patient Active Problem List   Diagnosis Date Noted  . Leg cramps 08/02/2014  . Ganglion cyst of flexor tendon sheath of finger of left hand 08/02/2014  . Elevated LFTs 04/28/2014  . Skin irritation 01/19/2014  . Post-operative state 11/14/2013  . Palpitations 10/12/2013  . Atypical ductal hyperplasia of left breast 07/12/2013  . Endometrial polyp 06/25/2013  . Rectocele 06/25/2013  . Diabetic neuropathy 05/09/2013  . Pre-ulcerative calluses 05/09/2013  . Pelvic pain 05/09/2013  . Bladder prolapse, female, acquired 05/09/2013  . Pain, joint, multiple sites 03/26/2013  . 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 Insomnia 11/29/2012  . Hip pain 09/08/2012  . Occupational therapy encounter 08/30/2012  . Muscle weakness (generalized) 06/26/2012  . Rotator cuff syndrome of left shoulder 06/21/2012  . Pain in joint, shoulder region 06/16/2012  . Peripheral edema 02/04/2012  . HSV (herpes simplex virus) infection 12/09/2011  . Dyspnea 11/15/2011  . Infiltrating  ductal carcinoma of left female breast 10/11/2011  . Essential hypertension, benign 10/11/2011  . DM (diabetes mellitus) 10/11/2011  . OA (osteoarthritis) 10/11/2011  . Morbidly obese 10/11/2011  . Asthma 10/11/2011  . Hyperlipidemia 10/11/2011  . Chronic fatigue 10/11/2011  . OSA (obstructive sleep apnea) 10/11/2011   Ihor Austin, LPTA; CBIS [phone]  Aldona Lento 10/25/2014, 5:06 PM  [facility] [address] [place], [place], [address] Phone: [phone]   Fax:  [phone]

## 2014-10-28 ENCOUNTER — Ambulatory Visit (HOSPITAL_COMMUNITY): Payer: Medicare Other | Admitting: Physical Therapy

## 2014-10-28 ENCOUNTER — Telehealth: Payer: Self-pay | Admitting: Family Medicine

## 2014-10-28 DIAGNOSIS — Z853 Personal history of malignant neoplasm of breast: Secondary | ICD-10-CM

## 2014-10-28 DIAGNOSIS — R2681 Unsteadiness on feet: Secondary | ICD-10-CM | POA: Diagnosis not present

## 2014-10-28 DIAGNOSIS — M6281 Muscle weakness (generalized): Secondary | ICD-10-CM

## 2014-10-28 DIAGNOSIS — M6289 Other specified disorders of muscle: Secondary | ICD-10-CM | POA: Diagnosis not present

## 2014-10-28 DIAGNOSIS — R6889 Other general symptoms and signs: Secondary | ICD-10-CM

## 2014-10-28 DIAGNOSIS — R29898 Other symptoms and signs involving the musculoskeletal system: Secondary | ICD-10-CM

## 2014-10-28 DIAGNOSIS — R262 Difficulty in walking, not elsewhere classified: Secondary | ICD-10-CM

## 2014-10-28 DIAGNOSIS — R293 Abnormal posture: Secondary | ICD-10-CM

## 2014-10-28 NOTE — Telephone Encounter (Signed)
Upon review of chart, patient has recently had CBC and CMP obtained (09/18/2014).  Call placed to patient husband and advised that we can order these labs, but insurance will generally not pay for them so close together.   Advised that we can order A1C and Lipid panel to be drawn at this time.   Patient to call back if she wants these labs ordered.

## 2014-10-28 NOTE — Therapy (Signed)
[place], [place], [address] Phone: [phone]   Fax:  [phone]  Physical Therapy Treatment  Patient Details  Name: Julie Clay MRN: [id number] Date of Birth: 1940-08-19 Referring Provider:  Patrici Ranks, MD  Encounter Date: 10/28/2014      PT End of Session - 10/28/14 1838    Visit Number 6   Number of Visits 16   Date for PT Re-Evaluation 11/06/14   Authorization Type Medicare/BCBS   Authorization Time Period 10/09/14 to 12/09/14   Authorization - Visit Number 6   Authorization - Number of Visits 10   PT Start Time 0923   PT Stop Time 1655   PT Time Calculation (min) 51 min   Equipment Utilized During Treatment Gait belt   Activity Tolerance Patient tolerated treatment well   Behavior During Therapy [facility] for tasks assessed/performed      Past Medical History  Diagnosis Date  . Hypertension   . Hyperlipidemia   . Diabetes mellitus   . Asthma     spirometry (2011)- mild ventilary defect  . Cancer 2012    Breast  . Arthritis     Osteoarthritis  . Fatty liver 2013    enlarged  . Sliding hiatal hernia   . Ventral hernia, unspecified, without mention of obstruction or gangrene   . Lung nodule seen on imaging study 2013  . HSV (herpes simplex virus) infection   . Lymphedema     Left arm  . 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 Calcification of left breast   . Arthritis   . Shortness of breath     with exertion  . Sleep apnea     Stop Bang score of 5. Pt has had sleep study, but was shown to be negative for sleep apnea.  Marland Kitchen Dysrhythmia     palpatations    Past Surgical History  Procedure Laterality Date  . Cesarean section    . 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 Breast surgery  jan 2013  . Hand ligament reconstruction Right   . Cataract extraction, bilateral      on different occassions   . Shoulder arthroscopy with open rotator cuff  repair Left 07/26/2012  . Breast biopsy  06/22/13  . Breast biopsy Left 09/12/2013    Procedure: BREAST BIOPSY WITH NEEDLE LOCALIZATION;  Surgeon: Jamesetta So, MD;  Location: [facility];  Service: General;  Laterality: Left;  . Hysteroscopy w/d&c N/A 11/06/2013    Procedure: DILATATION AND CURETTAGE /HYSTEROSCOPY;  Surgeon: Jonnie Kind, MD;  Location: [facility];  Service: Gynecology;  Laterality: N/A;  . Polypectomy N/A 11/06/2013    Procedure: POLYPECTOMY (REMOVAL ENDOMETRIAL POLYP);  Surgeon: Jonnie Kind, MD;  Location: [facility];  Service: Gynecology;  Laterality: N/A;  . Rectocele repair N/A 11/06/2013    Procedure: POSTERIOR REPAIR (RECTOCELE);  Surgeon: Jonnie Kind, MD;  Location: [facility];  Service: Gynecology;  Laterality: N/A;    There were no vitals filed for this visit.  Visit Diagnosis:  History of breast cancer in female  Weakness of both lower extremities  Proximal muscle weakness  Unsteadiness  Poor posture  Difficulty walking  Decreased functional activity tolerance              Subjective:  Pt states her LT knee is bothering her today but no other issues reported. 0/10 pain  [facility] Adult PT Treatment/Exercise - 10/28/14 1608    Knee/Hip Exercises: Stretches   Active Hamstring Stretch Both;30 seconds;2 reps   Active Hamstring Stretch Limitations 12 inch box    Passive Hamstring Stretch Both;30 seconds;2 reps   Passive Hamstring Stretch Limitations gastroc    Piriformis Stretch 30 seconds;2 reps   Piriformis Stretch Limitations seated    Knee/Hip Exercises: Aerobic   Nustep 10 minutes UE/LE seat 10 level 3 no hills   Knee/Hip Exercises: Standing   Heel Raises Both;1 set;15 reps   Heel Raises Limitations Toe raises 15x   Forward Lunges Both;1 set;10 reps   Forward Lunges Limitations 6 inch box no UE's   SLS Lt 30" Rt 6" max                  PT Short Term Goals - 10/25/14 1618    PT SHORT TERM GOAL #1   Title Patient will  demonstrate at least 4+/5 muscle strength in bilateral lower extremities and at least 4/5 muscle strength in proximal musculature    Status On-going   PT SHORT TERM GOAL #2   Title Patient will be to ambulate at least 1466f during 6 minute walk test today with reduced fatigue and increased gait speed    Status On-going   PT SHORT TERM GOAL #3   Title Patient will be able to verbalize the importance of good posture and will be able to maintain correct posture during all functional tasks and activities at least 90% of the time    Status On-going   PT SHORT TERM GOAL #4   Title Patient will consistently and correctly perform appropriate HEP to be advanced PRN    Baseline 10/25/2014: Every other day   Status On-going           PT Long Term Goals - 10/25/14 1619    PT LONG TERM GOAL #1   Title Patient will be demonstrate improved gait mechanics including improved gait speed, improved trunk and pelvic rotation, improved posture, and reduced overall pain during a gait period of at least 15 minutes    PT LONG TERM GOAL #2   Title Patient will improve FACIT-F score to at least 135   PT LONG TERM GOAL #3   Title Patient will demonstrate the ability to complete Berg balance test with a score of at least 52 to evidence reduced risk of falls, and will also be able to maintain SLS for at least 45 seconds each LE with no HHA    PT LONG TERM GOAL #4   Title Patient to state she has been able to increase the amount of 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  Abe to achieve 74 SPM on nustep today and only 3 seated rest breaks during session.  Improved SLS time on Lt to 30" but unable to increase time on Rt LE.  Unable to complete  step ups and lunges with Lt LE today due to knee instability and pain.   Pt able to complete all other exericses without difficulty.    PT Next Visit Plan Functional stretching, strengthening, balance tasks on dynamic surface,  functional activity tolerance.  Add balance beam next session.        Problem List Patient Active Problem List   Diagnosis Date Noted  . Leg cramps 08/02/2014  . Ganglion cyst of flexor tendon sheath of finger of left hand 08/02/2014  . Elevated LFTs 04/28/2014  . Skin irritation 01/19/2014  . Post-operative state 11/14/2013  . Palpitations 10/12/2013  . Atypical ductal hyperplasia of left breast 07/12/2013  . Endometrial polyp 06/25/2013  . Rectocele 06/25/2013  . Diabetic neuropathy 05/09/2013  . Pre-ulcerative calluses 05/09/2013  . Pelvic pain 05/09/2013  . Bladder prolapse, female, acquired 05/09/2013  . Pain, joint, multiple sites 03/26/2013  . 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 Insomnia 11/29/2012  . Hip pain 09/08/2012  . Occupational therapy encounter 08/30/2012  . Muscle weakness (generalized) 06/26/2012  . Rotator cuff syndrome of left shoulder 06/21/2012  . Pain in joint, shoulder region 06/16/2012  . Peripheral edema 02/04/2012  . HSV (herpes simplex virus) infection 12/09/2011  . Dyspnea 11/15/2011  . 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 Essential hypertension, benign 10/11/2011  . DM (diabetes mellitus) 10/11/2011  . OA (osteoarthritis) 10/11/2011  . Morbidly obese 10/11/2011  . Asthma 10/11/2011  . Hyperlipidemia 10/11/2011  . Chronic fatigue 10/11/2011  . OSA (obstructive sleep apnea) 10/11/2011    Teena Irani, PTA/CLT [phone]  10/28/2014, 6:42 PM  [facility] [address] [place], [place], [address] Phone: [phone]   Fax:  [phone]

## 2014-10-28 NOTE — Telephone Encounter (Signed)
Patient has an appointment with Dr. Buelah Manis on 11/08/14 she is requesting her lab order be faxed to [facility] in [place]. She would like blood work prior to coming for appointment she is requesting liver and kidney function test as well.

## 2014-11-01 ENCOUNTER — Ambulatory Visit (HOSPITAL_COMMUNITY): Payer: Medicare Other | Admitting: Physical Therapy

## 2014-11-01 DIAGNOSIS — R2681 Unsteadiness on feet: Secondary | ICD-10-CM

## 2014-11-01 DIAGNOSIS — R29898 Other symptoms and signs involving the musculoskeletal system: Secondary | ICD-10-CM

## 2014-11-01 DIAGNOSIS — M6281 Muscle weakness (generalized): Secondary | ICD-10-CM

## 2014-11-01 DIAGNOSIS — Z853 Personal history of malignant neoplasm of breast: Secondary | ICD-10-CM

## 2014-11-01 DIAGNOSIS — R262 Difficulty in walking, not elsewhere classified: Secondary | ICD-10-CM

## 2014-11-01 DIAGNOSIS — R6889 Other general symptoms and signs: Secondary | ICD-10-CM | POA: Diagnosis not present

## 2014-11-01 DIAGNOSIS — R293 Abnormal posture: Secondary | ICD-10-CM | POA: Diagnosis not present

## 2014-11-01 DIAGNOSIS — M6289 Other specified disorders of muscle: Secondary | ICD-10-CM | POA: Diagnosis not present

## 2014-11-01 NOTE — Therapy (Signed)
[place], [place], [address] Phone: [phone]   Fax:  [phone]  Physical Therapy Treatment  Patient Details  Name: Julie Clay MRN: [id number] Date of Birth: [birth year] Referring Provider:  Patrici Ranks, MD  Encounter Date: 11/01/2014      PT End of Session - 11/01/14 1555    Visit Number 7   Number of Visits 16   Date for PT Re-Evaluation 11/06/14   Authorization Type Medicare/BCBS   Authorization Time Period 10/09/14 to 12/09/14   Authorization - Visit Number 7   Authorization - Number of Visits 10   PT Start Time 3976   PT Stop Time 1558   PT Time Calculation (min) 43 min   Activity Tolerance Patient tolerated treatment well   Behavior During Therapy [facility] for tasks assessed/performed      Past Medical History  Diagnosis Date  . Hypertension   . Hyperlipidemia   . Diabetes mellitus   . Asthma     spirometry (2011)- mild ventilary defect  . Cancer 2012    Breast  . Arthritis     Osteoarthritis  . Fatty liver 2013    enlarged  . Sliding hiatal hernia   . Ventral hernia, unspecified, without mention of obstruction or gangrene   . Lung nodule seen on imaging study 2013  . HSV (herpes simplex virus) infection   . Lymphedema     Left arm  . 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 Calcification of left breast   . Arthritis   . Shortness of breath     with exertion  . Sleep apnea     Stop Bang score of 5. Pt has had sleep study, but was shown to be negative for sleep apnea.  Marland Kitchen Dysrhythmia     palpatations    Past Surgical History  Procedure Laterality Date  . Cesarean section    . 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 Breast surgery  jan 2013  . Hand ligament reconstruction Right   . Cataract extraction, bilateral      on different occassions   . Shoulder arthroscopy with open rotator cuff repair Left 07/26/2012  . Breast biopsy  06/22/13   . Breast biopsy Left 09/12/2013    Procedure: BREAST BIOPSY WITH NEEDLE LOCALIZATION;  Surgeon: Jamesetta So, MD;  Location: [facility];  Service: General;  Laterality: Left;  . Hysteroscopy w/d&c N/A 11/06/2013    Procedure: DILATATION AND CURETTAGE /HYSTEROSCOPY;  Surgeon: Jonnie Kind, MD;  Location: [facility];  Service: Gynecology;  Laterality: N/A;  . Polypectomy N/A 11/06/2013    Procedure: POLYPECTOMY (REMOVAL ENDOMETRIAL POLYP);  Surgeon: Jonnie Kind, MD;  Location: [facility];  Service: Gynecology;  Laterality: N/A;  . Rectocele repair N/A 11/06/2013    Procedure: POSTERIOR REPAIR (RECTOCELE);  Surgeon: Jonnie Kind, MD;  Location: [facility];  Service: Gynecology;  Laterality: N/A;    There were no vitals filed for this visit.  Visit Diagnosis:  History of breast cancer in female  Weakness of both lower extremities  Proximal muscle weakness  Unsteadiness  Difficulty walking      Subjective Assessment - 11/01/14 1522    Subjective Pt reports that she has been feeling fine overall. She has been trying to keep up with her walking.    Currently in Pain? No/denies  [facility] Adult PT Treatment/Exercise - 11/01/14 0001    Knee/Hip Exercises: Stretches   Active Hamstring Stretch Both;30 seconds;2 reps   Active Hamstring Stretch Limitations 12 inch box    Passive Hamstring Stretch Both;30 seconds;2 reps   Passive Hamstring Stretch Limitations gastroc    Piriformis Stretch 30 seconds;2 reps   Piriformis Stretch Limitations seated    Knee/Hip Exercises: Standing   Heel Raises Both;1 set;15 reps   Heel Raises Limitations Toe raises 15x   Forward Lunges Both;1 set;10 reps   Forward Lunges Limitations 6 inch box no UE's   Lateral Step Up 10 reps;Step Height: 6"   Forward Step Up 10 reps;Step Height: 6"   SLS Lt 23", Rt 7" max   Other Standing Knee Exercises 3D hip excursions 1x10   Other Standing Knee Exercises narrow BOS on airex x 30", foam  balance beam walking x 2 RT                PT Education - 11/01/14 1555    Education provided No          PT Short Term Goals - 10/25/14 1618    PT SHORT TERM GOAL #1   Title Patient will demonstrate at least 4+/5 muscle strength in bilateral lower extremities and at least 4/5 muscle strength in proximal musculature    Status On-going   PT SHORT TERM GOAL #2   Title Patient will be to ambulate at least 1479f during 6 minute walk test today with reduced fatigue and increased gait speed    Status On-going   PT SHORT TERM GOAL #3   Title Patient will be able to verbalize the importance of good posture and will be able to maintain correct posture during all functional tasks and activities at least 90% of the time    Status On-going   PT SHORT TERM GOAL #4   Title Patient will consistently and correctly perform appropriate HEP to be advanced PRN    Baseline 10/25/2014: Every other day   Status On-going           PT Long Term Goals - 10/25/14 1619    PT LONG TERM GOAL #1   Title Patient will be demonstrate improved gait mechanics including improved gait speed, improved trunk and pelvic rotation, improved posture, and reduced overall pain during a gait period of at least 15 minutes    PT LONG TERM GOAL #2   Title Patient will improve FACIT-F score to at least 135   PT LONG TERM GOAL #3   Title Patient will demonstrate the ability to complete Berg balance test with a score of at least 52 to evidence reduced risk of falls, and will also be able to maintain SLS for at least 45 seconds each LE with no HHA    PT LONG TERM GOAL #4   Title Patient to state she has been able to increase the amount of 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 She presented to PT with 950 steps on her pedometer, and left with 1050. She had difficulty with completing the balance beam activities, especially on foam. She is still unable to  maintain SLS for  greater than 7 seconds.    PT Next Visit Plan Continue with balance training on unstable surface, functional actvity tolerance        Problem List Patient Active Problem List   Diagnosis Date Noted  . Leg cramps 08/02/2014  . Ganglion cyst of flexor tendon sheath of finger of left hand 08/02/2014  . Elevated LFTs 04/28/2014  . Skin irritation 01/19/2014  . Post-operative state 11/14/2013  . Palpitations 10/12/2013  . Atypical ductal hyperplasia of left breast 07/12/2013  . Endometrial polyp 06/25/2013  . Rectocele 06/25/2013  . Diabetic neuropathy 05/09/2013  . Pre-ulcerative calluses 05/09/2013  . Pelvic pain 05/09/2013  . Bladder prolapse, female, acquired 05/09/2013  . Pain, joint, multiple sites 03/26/2013  . 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 Insomnia 11/29/2012  . Hip pain 09/08/2012  . Occupational therapy encounter 08/30/2012  . Muscle weakness (generalized) 06/26/2012  . Rotator cuff syndrome of left shoulder 06/21/2012  . Pain in joint, shoulder region 06/16/2012  . Peripheral edema 02/04/2012  . HSV (herpes simplex virus) infection 12/09/2011  . Dyspnea 11/15/2011  . 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 Essential hypertension, benign 10/11/2011  . DM (diabetes mellitus) 10/11/2011  . OA (osteoarthritis) 10/11/2011  . Morbidly obese 10/11/2011  . Asthma 10/11/2011  . Hyperlipidemia 10/11/2011  . Chronic fatigue 10/11/2011  . OSA (obstructive sleep apnea) 10/11/2011    Hilma Favors, PT, DPT [phone] 11/01/2014, 4:05 PM  [place], [place], [address] Phone: [phone]   Fax:  [phone]

## 2014-11-04 ENCOUNTER — Ambulatory Visit (HOSPITAL_COMMUNITY): Payer: Medicare Other | Admitting: Physical Therapy

## 2014-11-04 DIAGNOSIS — R6889 Other general symptoms and signs: Secondary | ICD-10-CM | POA: Diagnosis not present

## 2014-11-04 DIAGNOSIS — R2681 Unsteadiness on feet: Secondary | ICD-10-CM

## 2014-11-04 DIAGNOSIS — Z853 Personal history of malignant neoplasm of breast: Secondary | ICD-10-CM | POA: Diagnosis not present

## 2014-11-04 DIAGNOSIS — R29898 Other symptoms and signs involving the musculoskeletal system: Secondary | ICD-10-CM | POA: Diagnosis not present

## 2014-11-04 DIAGNOSIS — R262 Difficulty in walking, not elsewhere classified: Secondary | ICD-10-CM

## 2014-11-04 DIAGNOSIS — R293 Abnormal posture: Secondary | ICD-10-CM | POA: Diagnosis not present

## 2014-11-04 DIAGNOSIS — M6281 Muscle weakness (generalized): Secondary | ICD-10-CM

## 2014-11-04 DIAGNOSIS — M6289 Other specified disorders of muscle: Secondary | ICD-10-CM | POA: Diagnosis not present

## 2014-11-04 NOTE — Therapy (Signed)
[place], [place], [address] Phone: [phone]   Fax:  [phone]  Physical Therapy Treatment (Re-Assessment)  Patient Details  Name: Julie Clay MRN: [id number] Date of Birth: 09-Oct-1940 Referring Provider:  Patrici Ranks, MD  Encounter Date: 11/04/2014      PT End of Session - 11/04/14 1720    Visit Number 8   Number of Visits 16   Date for PT Re-Evaluation 12/02/14   Authorization Type Medicare/BCBS   Authorization Time Period 10/09/14 to 12/09/14; G-code done 8th visit    Authorization - Visit Number 8   Authorization - Number of Visits 10   PT Start Time 1608   PT Stop Time 1638   PT Time Calculation (min) 30 min   Activity Tolerance Patient tolerated treatment well   Behavior During Therapy [facility] for tasks assessed/performed      Past Medical History  Diagnosis Date  . Hypertension   . Hyperlipidemia   . Diabetes mellitus   . Asthma     spirometry (2011)- mild ventilary defect  . Cancer 2012    Breast  . Arthritis     Osteoarthritis  . Fatty liver 2013    enlarged  . Sliding hiatal hernia   . Ventral hernia, unspecified, without mention of obstruction or gangrene   . Lung nodule seen on imaging study 2013  . HSV (herpes simplex virus) infection   . Lymphedema     Left arm  . 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 Calcification of left breast   . Arthritis   . Shortness of breath     with exertion  . Sleep apnea     Stop Bang score of 5. Pt has had sleep study, but was shown to be negative for sleep apnea.  Marland Kitchen Dysrhythmia     palpatations    Past Surgical History  Procedure Laterality Date  . Cesarean section    . 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 Breast surgery  jan 2013  . Hand ligament reconstruction Right   . Cataract extraction, bilateral      on different occassions   . Shoulder arthroscopy with open rotator cuff repair Left  07/26/2012  . Breast biopsy  06/22/13  . Breast biopsy Left 09/12/2013    Procedure: BREAST BIOPSY WITH NEEDLE LOCALIZATION;  Surgeon: Jamesetta So, MD;  Location: [facility];  Service: General;  Laterality: Left;  . Hysteroscopy w/d&c N/A 11/06/2013    Procedure: DILATATION AND CURETTAGE /HYSTEROSCOPY;  Surgeon: Jonnie Kind, MD;  Location: [facility];  Service: Gynecology;  Laterality: N/A;  . Polypectomy N/A 11/06/2013    Procedure: POLYPECTOMY (REMOVAL ENDOMETRIAL POLYP);  Surgeon: Jonnie Kind, MD;  Location: [facility];  Service: Gynecology;  Laterality: N/A;  . Rectocele repair N/A 11/06/2013    Procedure: POSTERIOR REPAIR (RECTOCELE);  Surgeon: Jonnie Kind, MD;  Location: [facility];  Service: Gynecology;  Laterality: N/A;    There were no vitals filed for this visit.  Visit Diagnosis:  History of breast cancer in female  Weakness of both lower extremities  Proximal muscle weakness  Unsteadiness  Difficulty walking  Poor posture  Decreased functional activity tolerance      Subjective Assessment - 11/04/14 1613    Subjective Patient states she is diong well today, but her hip is feeling a little rough today   Pertinent History Cancer was diagnosed in November of  2012; did not undergo chemo or radiation because diagnosis was made early. Patient went to [facility] for treatments. History of lung lobectomy; also has around 50-60% of lung capacity. Low back pain when she is moving some times.     How long can you stand comfortably? 7/18- no real changes    How long can you walk comfortably? 7/18- 6 minutes due to back    Currently in Pain? No/denies            [facility] PT Assessment - 11/04/14 0001    Observation/Other Assessments   Observations TUG 8.6, 8.8, 8.9    AROM   Right Hip Internal Rotation  46   Left Hip Internal Rotation  43   Strength   Right Hip Flexion 3/5   Right Hip Extension 3/5   Right Hip ABduction 3/5   Left Hip Flexion 3/5   Left Hip Extension 3/5    Left Hip ABduction 3/5   Right Knee Flexion 4/5   Right Knee Extension 4+/5   Left Knee Flexion 4-/5   Left Knee Extension 4+/5   Right Ankle Dorsiflexion 5/5   Left Ankle Dorsiflexion 5/5   Ambulation/Gait   Gait Comments 6 minute walk 1012 feet                      [facility] Adult PT Treatment/Exercise - 11/04/14 0001    Knee/Hip Exercises: Stretches   Active Hamstring Stretch Both;2 reps;30 seconds   Active Hamstring Stretch Limitations stairs    Passive Hamstring Stretch Both;3 reps;30 seconds   Passive Hamstring Stretch Limitations gastroc on slantboard                 PT Education - 11/04/14 1718    Education provided Yes   Education Details progress with skilled PT services, prognosis    Person(s) Educated Patient   Methods Explanation   Comprehension Verbalized understanding          PT Short Term Goals - 10/25/14 1618    PT SHORT TERM GOAL #1   Title Patient will demonstrate at least 4+/5 muscle strength in bilateral lower extremities and at least 4/5 muscle strength in proximal musculature    Status On-going   PT SHORT TERM GOAL #2   Title Patient will be to ambulate at least 1451f during 6 minute walk test today with reduced fatigue and increased gait speed    Status On-going   PT SHORT TERM GOAL #3   Title Patient will be able to verbalize the importance of good posture and will be able to maintain correct posture during all functional tasks and activities at least 90% of the time    Status On-going   PT SHORT TERM GOAL #4   Title Patient will consistently and correctly perform appropriate HEP to be advanced PRN    Baseline 10/25/2014: Every other day   Status On-going           PT Long Term Goals - 10/25/14 1619    PT LONG TERM GOAL #1   Title Patient will be demonstrate improved gait mechanics including improved gait speed, improved trunk and pelvic rotation, improved posture, and reduced overall pain during a gait period of at  least 15 minutes    PT LONG TERM GOAL #2   Title Patient will improve FACIT-F score to at least 135   PT LONG TERM GOAL #3   Title Patient will demonstrate the ability to complete Berg balance test  with a score of at least 52 to evidence reduced risk of falls, and will also be able to maintain SLS for at least 45 seconds each LE with no HHA    PT LONG TERM GOAL #4   Title Patient to state she has been able to increase the amount of 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 Patient remains generally at her baseline level of function as noted at initial evaluation, however does continue to demonstrate significant proximal muscle weakness which could possibly be contributing to her hip and back pain. SHe also continues to demonstrate balance deficits. Pateint was educated on plan of care moving forward if she would like to continue with skilled PT services, including focus on balance and proximal muscle strength, and stated that she would like to speak to her MD before continuing. At this time PT recommended that if patient is interested in continuing she may continue to benefit from skilled PT services with a refocus on deficits.    Pt will benefit from skilled therapeutic intervention in order to improve on the following deficits Abnormal gait;Decreased endurance;Cardiopulmonary status limiting activity;Obesity;Decreased activity tolerance;Decreased strength;Pain;Decreased balance;Decreased mobility;Difficulty walking;Decreased coordination;Postural dysfunction   Rehab Potential Good   PT Frequency 2x / week   PT Duration 4 weeks   PT Treatment/Interventions ADLs/Self Care Home Management;Gait training;Stair training;Functional mobility training;Therapeutic  activities;Therapeutic exercise;Balance training;Neuromuscular re-education;Patient/family education;Energy conservation   PT Next Visit Plan Continue with balance training on unstable surface, functional actvity tolerance, focus on proximal muscle strength    PT Home Exercise Plan given    Consulted and Agree with Plan of Care Patient          G-Codes - 2014-11-09 1724    Functional Assessment Tool Used Based on skilled clinical assessment of strength, posture, gait, pain, functional activity tolerance, balance    Functional Limitation Mobility: Walking and moving around   Mobility: Walking and Moving Around Current Status (Z6109) At least 80 percent but less than 100 percent impaired, limited or restricted   Mobility: Walking and Moving Around Goal Status [id number]) At least 60 percent but less than 80 percent impaired, limited or restricted      Problem List Patient Active Problem List   Diagnosis Date Noted  . Leg cramps 08/02/2014  . Ganglion cyst of flexor tendon sheath of finger of left hand 08/02/2014  . Elevated LFTs 04/28/2014  . Skin irritation 01/19/2014  . Post-operative state 11/14/2013  . Palpitations 10/12/2013  . Atypical ductal hyperplasia of left breast 07/12/2013  . Endometrial polyp 06/25/2013  . Rectocele 06/25/2013  . Diabetic neuropathy 05/09/2013  . Pre-ulcerative calluses 05/09/2013  . Pelvic pain 05/09/2013  . Bladder prolapse, female, acquired 05/09/2013  . Pain, joint, multiple sites 03/26/2013  . 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 Insomnia 11/29/2012  . Hip pain 09/08/2012  . Occupational therapy encounter 08/30/2012  . Muscle weakness (generalized) 06/26/2012  . Rotator cuff syndrome of left shoulder 06/21/2012  . Pain in joint, shoulder region 06/16/2012  . Peripheral edema 02/04/2012  . HSV (herpes simplex virus) infection 12/09/2011  . Dyspnea 11/15/2011  . 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  Essential hypertension, benign  10/11/2011  . DM (diabetes mellitus) 10/11/2011  . OA (osteoarthritis) 10/11/2011  . Morbidly obese 10/11/2011  . Asthma 10/11/2011  . Hyperlipidemia 10/11/2011  . Chronic fatigue 10/11/2011  . OSA (obstructive sleep apnea) 10/11/2011    Physical Therapy Progress Note  Dates of Reporting Period: 10/09/14 to 11/04/14  Objective Reports of Subjective Statement: see above   Objective Measurements: see above   Goal Update: see above   Plan: see above   Reason Skilled Services are Required: proximal muscle weakness, poor balance, reduced functional activity tolerance     Deniece Ree PT, DPT [place], [place], [address] Phone: [phone]   Fax:  [phone]

## 2014-11-05 ENCOUNTER — Other Ambulatory Visit (HOSPITAL_COMMUNITY): Payer: Medicare Other

## 2014-11-05 ENCOUNTER — Other Ambulatory Visit (HOSPITAL_COMMUNITY): Payer: Self-pay | Admitting: *Deleted

## 2014-11-05 DIAGNOSIS — R7989 Other specified abnormal findings of blood chemistry: Secondary | ICD-10-CM

## 2014-11-05 DIAGNOSIS — E785 Hyperlipidemia, unspecified: Secondary | ICD-10-CM

## 2014-11-05 DIAGNOSIS — E119 Type 2 diabetes mellitus without complications: Secondary | ICD-10-CM

## 2014-11-05 DIAGNOSIS — R945 Abnormal results of liver function studies: Principal | ICD-10-CM

## 2014-11-06 ENCOUNTER — Other Ambulatory Visit (HOSPITAL_COMMUNITY): Payer: Self-pay

## 2014-11-07 ENCOUNTER — Encounter (HOSPITAL_COMMUNITY): Payer: Medicare Other | Attending: Hematology & Oncology

## 2014-11-07 DIAGNOSIS — R945 Abnormal results of liver function studies: Secondary | ICD-10-CM | POA: Diagnosis not present

## 2014-11-07 DIAGNOSIS — E119 Type 2 diabetes mellitus without complications: Secondary | ICD-10-CM

## 2014-11-07 DIAGNOSIS — E785 Hyperlipidemia, unspecified: Secondary | ICD-10-CM

## 2014-11-07 DIAGNOSIS — R7989 Other specified abnormal findings of blood chemistry: Secondary | ICD-10-CM

## 2014-11-07 DIAGNOSIS — C50912 Malignant neoplasm of unspecified site of left female breast: Secondary | ICD-10-CM | POA: Insufficient documentation

## 2014-11-07 LAB — COMPREHENSIVE METABOLIC PANEL
ALT: 56 U/L — ABNORMAL HIGH (ref 14–54)
ANION GAP: 11 (ref 5–15)
AST: 66 U/L — AB (ref 15–41)
Albumin: 3.6 g/dL (ref 3.5–5.0)
Alkaline Phosphatase: 39 U/L (ref 38–126)
BUN: 28 mg/dL — AB (ref 6–20)
CO2: 25 mmol/L (ref 22–32)
Calcium: 9.2 mg/dL (ref 8.9–10.3)
Chloride: 104 mmol/L (ref 101–111)
Creatinine, Ser: 1.02 mg/dL — ABNORMAL HIGH (ref 0.44–1.00)
GFR calc Af Amer: >60 mL/min (ref >60)
GFR calc non Af Amer: 54 mL/min — ABNORMAL LOW (ref >60)
Glucose, Bld: 150 mg/dL — ABNORMAL HIGH (ref 65–99)
Potassium: 3.6 mmol/L (ref 3.5–5.1)
Sodium: 140 mmol/L (ref 135–145)
TOTAL PROTEIN: 6.8 g/dL (ref 6.5–8.1)
Total Bilirubin: 0.5 mg/dL (ref 0.3–1.2)

## 2014-11-07 LAB — LIPID PANEL
CHOL/HDL RATIO: 3.5 ratio
Cholesterol: 152 mg/dL (ref 0–200)
HDL: 43 mg/dL (ref >40)
LDL Cholesterol: 76 mg/dL (ref 0–99)
Triglycerides: 164 mg/dL — ABNORMAL HIGH (ref <150)
VLDL: 33 mg/dL (ref 0–40)

## 2014-11-07 NOTE — Progress Notes (Signed)
LABS DRAWN

## 2014-11-08 ENCOUNTER — Telehealth (HOSPITAL_COMMUNITY): Payer: Self-pay | Admitting: Physical Therapy

## 2014-11-08 ENCOUNTER — Ambulatory Visit (INDEPENDENT_AMBULATORY_CARE_PROVIDER_SITE_OTHER): Payer: Medicare Other | Admitting: Family Medicine

## 2014-11-08 ENCOUNTER — Ambulatory Visit (HOSPITAL_COMMUNITY): Payer: Medicare Other | Admitting: Physical Therapy

## 2014-11-08 ENCOUNTER — Encounter: Payer: Self-pay | Admitting: Family Medicine

## 2014-11-08 VITALS — BP 140/82 | HR 88 | Temp 98.6°F | Resp 16 | Ht 65.0 in | Wt 239.0 lb

## 2014-11-08 DIAGNOSIS — N182 Chronic kidney disease, stage 2 (mild): Secondary | ICD-10-CM | POA: Diagnosis not present

## 2014-11-08 DIAGNOSIS — I10 Essential (primary) hypertension: Secondary | ICD-10-CM | POA: Diagnosis not present

## 2014-11-08 DIAGNOSIS — R7989 Other specified abnormal findings of blood chemistry: Secondary | ICD-10-CM

## 2014-11-08 DIAGNOSIS — R945 Abnormal results of liver function studies: Secondary | ICD-10-CM

## 2014-11-08 DIAGNOSIS — M2550 Pain in unspecified joint: Secondary | ICD-10-CM

## 2014-11-08 DIAGNOSIS — E1143 Type 2 diabetes mellitus with diabetic autonomic (poly)neuropathy: Secondary | ICD-10-CM | POA: Diagnosis not present

## 2014-11-08 DIAGNOSIS — R42 Dizziness and giddiness: Secondary | ICD-10-CM

## 2014-11-08 LAB — HEMOGLOBIN A1C
Hgb A1c MFr Bld: 7 % — ABNORMAL HIGH (ref 4.8–5.6)
Mean Plasma Glucose: 154 mg/dL

## 2014-11-08 MED ORDER — FLUOCINONIDE 0.05 % EX CREA: 1.0000 "application " | TOPICAL_CREAM | Freq: Two times a day (BID) | CUTANEOUS | Status: DC | PRN

## 2014-11-08 NOTE — Therapy (Addendum)
[place], [place], [address] Phone: [phone]   Fax:  [phone]  Patient Details  Name: Julie Clay MRN: [id number] Date of Birth: [birth year] Referring Provider:  Patrici Ranks, MD  Encounter Date: 11/08/14  PHYSICAL THERAPY DISCHARGE SUMMARY  Visits from Start of Care: 8  Current functional level related to goals / functional outcomes: Patient reports that she has not noticed a huge change in her level of function, continues to be limited in functional activity tolerance and by pre-existing pain in her hips. MD advises to DC from therapy at this time as PT reassessment showed no significant gains.    Remaining deficits: Reduced functional activity tolerance, postural and gait impairment, weakness, reduced balance    Education / Equipment: Completed STAR exit survey via phone.  Plan: Patient agrees to discharge.  Patient goals were not met. Patient is being discharged due to the physician's request.  ?????       Deniece Ree PT, DPT [place], [place], [address] Phone: [phone]   Fax:  [phone]

## 2014-11-08 NOTE — Patient Instructions (Addendum)
[place] of liver to be done Biofreeze or asperceme using  Use heat to the back Check your blood pressure once a day We will f/u by phone next Wed  F/U 3 MONTHS

## 2014-11-08 NOTE — Progress Notes (Signed)
Patient ID: Julie Clay, female   DOB: 04-Jul-1940, 74 y.o.   MRN: [id number]   Subjective:    Patient ID: Julie Clay, female    DOB: 1941-03-26, 74 y.o.   MRN: [id number]  Patient presents for 3 month F/U  patient had a follow-up chronic medical problems. She has multiple concerns. She initially started visit with a flare of her chronic back pain she has known degenerative disc disease as well as facet arthropathy and mild disc bulge. She's been seen by orthopedics in the past. She was taking a few days of Naprosyn however has stopped this. She does see the chiropractor once a week [place] [facility]   She has pain when she tries to bend or stand long. The time. She denies any radicular symptoms. She also complains of pain in her left thumb she has history of osteoarthritis and bilateral hand she's had surgery on her right thumb in the past.  She did well and states for the past few weeks she's had a couple episodes where she felt dizzy and extremely fatigued. Her blood pressure also shot up to around 767M systolic on both instances. She did not have any chest pain or shortness of breath. They were very short-lived episodes. The last one occurred after walking up some stairs. Fasting labs are reviewed and her liver function continued to be elevated her cholesterol has improved as well as her A1c.   Review Of Systems:  GEN- + fatigue, fever, weight loss,weakness, recent illness HEENT- denies eye drainage, change in vision, nasal discharge, CVS- denies chest pain, palpitations RESP- denies SOB, cough, wheeze ABD- denies N/V, change in stools, abd pain GU- denies dysuria, hematuria, dribbling, incontinence MSK- + joint pain, muscle aches, injury Neuro- denies headache,+ dizziness, syncope, seizure activity       Objective:    BP 140/82 mmHg  Pulse 88  Temp(Src) 98.6 F (37 C) (Oral)  Resp 16  Ht '5\' 5"'$  (1.651 m)  Wt 239 lb (108.41 kg)  BMI 39.77 kg/m2 GEN- NAD, alert and  oriented x3 HEENT- PERRL, EOMI, non injected sclera, pink conjunctiva, MMM, oropharynx clear Neck- Supple, no thyromegaly CVS- RRR, no murmur RESP-CTAB ABD-NABS,soft,NT,N, large pannus Neuro-CNII-XII intact, no new focal deficits MSK- Decreased ROM upper and LE, Spine, Knees, left finger, no triggering, TTP at PIP, no swelling noted EXT- trace pedal edema Pulses- Radial  2+        Assessment & Plan:      Problem List Items Addressed This Visit    Pain, joint, multiple sites    Known OA, DDD, mild disc buldge, when discussed options which are limited, she wants to continue current meds, regarding OA of hand as they same side as her lymphedema I would be worried to intervene surgically on this side Will give trial of biofreeze or aspercreme to thumb      Essential hypertension, benign    BP looks okay today for her age, will have her monitor at home if truly increasing which may be contributing to some of the symptoms she has been having will add norvascc 2.'5mg'$  to regimen Labs in general looked good      Elevated LFTs    persistantly elevated LFT, my initial DD is fatty liver with her obesity and other comorbidies, however now with the strange symptoms above,and recurrent BS history will obtain imaging of liver r/o metastatic lesions      DM (diabetes mellitus)    A1C improved, no change to meds, she  is at goal      CKD (chronic kidney disease), stage II - Primary    Kidney disease is stable        Other Visit Diagnoses    Dizzy spells        unclear cause, she is typically exerting herself when they occur, with her habitus, polypharmacy, age, common to have spells, BP also quite elevated when this occurs, no sign of stroke on exam, if BP does not improve, with normal labs, otherise normal exam, may need CT of head       Note: This dictation was prepared with Dragon dictation along with smaller phrase technology. Any transcriptional errors that result from this process  are unintentional.

## 2014-11-08 NOTE — Telephone Encounter (Signed)
Called patient and completed STAR patient exit survey.  Deniece Ree PT, DPT [phone]

## 2014-11-10 NOTE — Assessment & Plan Note (Signed)
persistantly elevated LFT, my initial DD is fatty liver with her obesity and other comorbidies, however now with the strange symptoms above,and recurrent BS history will obtain imaging of liver r/o metastatic lesions

## 2014-11-10 NOTE — Assessment & Plan Note (Signed)
Kidney disease is stable

## 2014-11-10 NOTE — Assessment & Plan Note (Signed)
Known OA, DDD, mild disc buldge, when discussed options which are limited, she wants to continue current meds, regarding OA of hand as they same side as her lymphedema I would be worried to intervene surgically on this side Will give trial of biofreeze or aspercreme to thumb

## 2014-11-10 NOTE — Assessment & Plan Note (Signed)
BP looks okay today for her age, will have her monitor at home if truly increasing which may be contributing to some of the symptoms she has been having will add norvascc 2.'5mg'$  to regimen Labs in general looked good

## 2014-11-10 NOTE — Assessment & Plan Note (Signed)
A1C improved, no change to meds, she is at goal

## 2014-11-11 ENCOUNTER — Ambulatory Visit (HOSPITAL_COMMUNITY): Payer: Medicare Other | Admitting: Physical Therapy

## 2014-11-12 ENCOUNTER — Ambulatory Visit (INDEPENDENT_AMBULATORY_CARE_PROVIDER_SITE_OTHER): Payer: Medicare Other | Admitting: Family Medicine

## 2014-11-12 ENCOUNTER — Encounter: Payer: Self-pay | Admitting: Family Medicine

## 2014-11-12 VITALS — BP 130/68 | HR 72 | Temp 99.0°F | Resp 18 | Ht 65.0 in | Wt 239.0 lb

## 2014-11-12 DIAGNOSIS — N390 Urinary tract infection, site not specified: Secondary | ICD-10-CM | POA: Diagnosis not present

## 2014-11-12 DIAGNOSIS — R300 Dysuria: Secondary | ICD-10-CM | POA: Diagnosis not present

## 2014-11-12 LAB — URINALYSIS, ROUTINE W REFLEX MICROSCOPIC
Bilirubin Urine: NEGATIVE
Glucose, UA: NEGATIVE
HGB URINE DIPSTICK: NEGATIVE
Ketones, ur: NEGATIVE
Nitrite: NEGATIVE
Protein, ur: NEGATIVE
SPECIFIC GRAVITY, URINE: 1.015 (ref 1.001–1.035)
pH: 7.5 (ref 5.0–8.0)

## 2014-11-12 LAB — URINALYSIS, MICROSCOPIC ONLY
Casts: NONE SEEN [LPF]
Crystals: NONE SEEN [HPF]
RBC / HPF: NONE SEEN RBC/HPF (ref <=2)
Yeast: NONE SEEN [HPF]

## 2014-11-12 MED ORDER — CIPROFLOXACIN HCL 500 MG PO TABS
500.0000 mg | ORAL_TABLET | Freq: Two times a day (BID) | ORAL | Status: DC
Start: 2014-11-12 — End: 2014-12-04

## 2014-11-12 NOTE — Patient Instructions (Signed)
Glipizde take 1/2 tablet twice a day while on antibiotics  Take antibiotics as prescribed Cranberry continue  F/U as previous

## 2014-11-12 NOTE — Progress Notes (Signed)
Patient ID: Julie Clay, female   DOB: 1941-03-22, 74 y.o.   MRN: [id number]   Subjective:    Patient ID: Julie Clay, female    DOB: 06-18-1940, 74 y.o.   MRN: [id number]  Patient presents for Dysuria  here dysuria for the past 3-4 days. Symptoms actually started after our last visit. She denies any blood in the stool but has some spasms in her bladder region. She did start taking some cranberry which helped minimally. She has not had any fever no nausea vomiting   Review Of Systems:  GEN- denies fatigue, fever, weight loss,weakness, recent illness HEENT- denies eye drainage, change in vision, nasal discharge, CVS- denies chest pain, palpitations RESP- denies SOB, cough, wheeze ABD- denies N/V, change in stools, abd pain GU- +dysuria, hematuria, dribbling, incontinence MSK- +joint pain, muscle aches, injury Neuro- denies headache, dizziness, syncope, seizure activity       Objective:    BP 130/68 mmHg  Pulse 72  Temp(Src) 99 F (37.2 C) (Oral)  Resp 18  Ht '5\' 5"'$  (1.651 m)  Wt 239 lb (108.41 kg)  BMI 39.77 kg/m2 GEN- NAD, alert and oriented x3 CVS- RRR, no murmur RESP-CTAB ABD-NABS,soft,NT,ND, no CVA tenderness          Assessment & Plan:      Problem List Items Addressed This Visit    None    Visit Diagnoses    UTI (lower urinary tract infection)    -  Primary    Urine culture, sent, start Cipro, due to interaction with glipzide, will decrease dose of glipizide for next week    Relevant Orders    Urinalysis, Routine w reflex microscopic (not at [facility])    Urine culture       Note: This dictation was prepared with Dragon dictation along with smaller phrase technology. Any transcriptional errors that result from this process are unintentional.

## 2014-11-14 ENCOUNTER — Ambulatory Visit (HOSPITAL_COMMUNITY)
Admission: RE | Admit: 2014-11-14 | Discharge: 2014-11-14 | Disposition: A | Discharge status: Home / Self Care | Payer: Medicare Other | Source: Ambulatory Visit | Attending: Family Medicine | Admitting: Family Medicine

## 2014-11-14 DIAGNOSIS — R160 Hepatomegaly, not elsewhere classified: Secondary | ICD-10-CM | POA: Insufficient documentation

## 2014-11-14 DIAGNOSIS — R935 Abnormal findings on diagnostic imaging of other abdominal regions, including retroperitoneum: Secondary | ICD-10-CM | POA: Diagnosis not present

## 2014-11-14 DIAGNOSIS — R7989 Other specified abnormal findings of blood chemistry: Secondary | ICD-10-CM | POA: Diagnosis not present

## 2014-11-14 DIAGNOSIS — Z853 Personal history of malignant neoplasm of breast: Secondary | ICD-10-CM | POA: Diagnosis not present

## 2014-11-14 DIAGNOSIS — R945 Abnormal results of liver function studies: Secondary | ICD-10-CM

## 2014-11-14 DIAGNOSIS — R932 Abnormal findings on diagnostic imaging of liver and biliary tract: Secondary | ICD-10-CM | POA: Diagnosis not present

## 2014-11-14 LAB — URINE CULTURE: Colony Count: 4000

## 2014-11-15 ENCOUNTER — Other Ambulatory Visit: Payer: Self-pay | Admitting: *Deleted

## 2014-11-15 ENCOUNTER — Ambulatory Visit (HOSPITAL_COMMUNITY): Payer: Medicare Other

## 2014-11-15 DIAGNOSIS — R945 Abnormal results of liver function studies: Principal | ICD-10-CM

## 2014-11-15 DIAGNOSIS — R7989 Other specified abnormal findings of blood chemistry: Secondary | ICD-10-CM

## 2014-11-15 DIAGNOSIS — R160 Hepatomegaly, not elsewhere classified: Secondary | ICD-10-CM

## 2014-11-15 DIAGNOSIS — K760 Fatty (change of) liver, not elsewhere classified: Secondary | ICD-10-CM

## 2014-11-18 ENCOUNTER — Ambulatory Visit (HOSPITAL_COMMUNITY): Payer: Medicare Other | Admitting: Physical Therapy

## 2014-11-20 ENCOUNTER — Encounter (INDEPENDENT_AMBULATORY_CARE_PROVIDER_SITE_OTHER): Payer: Self-pay | Admitting: *Deleted

## 2014-11-22 ENCOUNTER — Ambulatory Visit (HOSPITAL_COMMUNITY): Payer: Medicare Other

## 2014-11-25 ENCOUNTER — Ambulatory Visit (HOSPITAL_COMMUNITY): Payer: Medicare Other | Admitting: Physical Therapy

## 2014-11-29 ENCOUNTER — Ambulatory Visit (HOSPITAL_COMMUNITY): Payer: Medicare Other

## 2014-12-02 ENCOUNTER — Ambulatory Visit (HOSPITAL_COMMUNITY): Payer: Medicare Other | Admitting: Physical Therapy

## 2014-12-04 ENCOUNTER — Encounter (INDEPENDENT_AMBULATORY_CARE_PROVIDER_SITE_OTHER): Payer: Self-pay | Admitting: Internal Medicine

## 2014-12-04 ENCOUNTER — Telehealth (INDEPENDENT_AMBULATORY_CARE_PROVIDER_SITE_OTHER): Payer: Self-pay | Admitting: *Deleted

## 2014-12-04 ENCOUNTER — Ambulatory Visit (INDEPENDENT_AMBULATORY_CARE_PROVIDER_SITE_OTHER): Payer: Medicare Other | Admitting: Internal Medicine

## 2014-12-04 VITALS — BP 136/58 | HR 88 | Temp 97.5°F | Ht 65.0 in | Wt 239.0 lb

## 2014-12-04 DIAGNOSIS — K760 Fatty (change of) liver, not elsewhere classified: Secondary | ICD-10-CM

## 2014-12-04 DIAGNOSIS — R748 Abnormal levels of other serum enzymes: Secondary | ICD-10-CM | POA: Diagnosis not present

## 2014-12-04 NOTE — Addendum Note (Signed)
Addended by: Butch Penny on: 12/04/2014 03:33 PM   Modules accepted: Orders, Medications

## 2014-12-04 NOTE — Patient Instructions (Addendum)
Labs today. OV in 6 months. Diet and exercise. May call husband with results.

## 2014-12-04 NOTE — Progress Notes (Addendum)
Subjective:    Patient ID: Julie Clay, female    DOB: 10-14-1940, 74 y.o.   MRN: [id number]  HPI Referred to our office for elevated liver enzymes and fatty liver. Liver enzymes normal in 2014. She denies prior hx of jaundice. She denies IV drug use. No tattoos. No ear piercing.  Her appetite is good. . She is trying to lose weight. She has lost 10 pounds in the past 6 months. She says she has trouble exercising. She has pain in her knee. She denies abdominal pain.  She usually has a BM daily. BS+ usually very low or very high. Has been a diabetic for over 10 yrs.  In April her HA1C 7.1.      11/14/2014 US abdomen: Elevated liver function test, hx of breast cancer.  IMPRESSION: 1. Echogenic inhomogeneous liver parenchyma consistent with fatty infiltration. No focal hepatic abnormality is seen. 2. No gallstones. 3. Hepatomegaly. 4. Some of the anatomy particularly the pancreas and abdominal aorta are obscured by bowel gas and body habitus.    CMP Latest Ref Rng 11/07/2014 09/18/2014 07/31/2014  Glucose 65 - 99 mg/dL 150(H) 257(H) 133(H)  BUN 6 - 20 mg/dL 28(H) 27(H) 22  Creatinine 0.44 - 1.00 mg/dL 1.02(H) 1.31(H) 1.02  Sodium 135 - 145 mmol/L 140 139 141  Potassium 3.5 - 5.1 mmol/L 3.6 3.9 4.2  Chloride 101 - 111 mmol/L 104 103 103  CO2 22 - 32 mmol/L '25 24 24  '$ Calcium 8.9 - 10.3 mg/dL 9.2 9.4 9.2  Total Protein 6.5 - 8.1 g/dL 6.8 7.0 6.3  Total Bilirubin 0.3 - 1.2 mg/dL 0.5 0.4 0.5  Alkaline Phos 38 - 126 U/L 39 41 39  AST 15 - 41 U/L 66(H) 72(H) 43(H)  ALT 14 - 54 U/L 56(H) 57(H) 42(H)   Lipid Panel     Component Value Date/Time   CHOL 152 11/07/2014 0838   TRIG 164* 11/07/2014 0838   HDL 43 11/07/2014 0838   CHOLHDL 3.5 11/07/2014 0838   VLDL 33 11/07/2014 0838   LDLCALC 76 11/07/2014 0838   LDLDIRECT 102* 12/09/2011 1035       Review of Systems Past Medical History  Diagnosis Date  . Hypertension   . Hyperlipidemia   . Diabetes mellitus   . Asthma       spirometry (2011)- mild ventilary defect  . Cancer 2012    Breast  . Arthritis     Osteoarthritis  . Fatty liver 2013    enlarged  . Sliding hiatal hernia   . Ventral hernia, unspecified, without mention of obstruction or gangrene   . Lung nodule seen on imaging study 2013  . HSV (herpes simplex virus) infection   . Lymphedema     Left arm  . 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 Calcification of left breast   . Arthritis   . Shortness of breath     with exertion  . Sleep apnea     Stop Bang score of 5. Pt has had sleep study, but was shown to be negative for sleep apnea.  Marland Kitchen Dysrhythmia     palpatations    Past Surgical History  Procedure Laterality Date  . Cesarean section    . 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 Breast surgery  jan 2013  . Hand ligament reconstruction Right   . Cataract extraction, bilateral      on different occassions   . Shoulder arthroscopy with  open rotator cuff repair Left 07/26/2012  . Breast biopsy  06/22/13  . Breast biopsy Left 09/12/2013    Procedure: BREAST BIOPSY WITH NEEDLE LOCALIZATION;  Surgeon: Jamesetta So, MD;  Location: [facility];  Service: General;  Laterality: Left;  . Hysteroscopy w/d&c N/A 11/06/2013    Procedure: DILATATION AND CURETTAGE /HYSTEROSCOPY;  Surgeon: Jonnie Kind, MD;  Location: [facility];  Service: Gynecology;  Laterality: N/A;  . Polypectomy N/A 11/06/2013    Procedure: POLYPECTOMY (REMOVAL ENDOMETRIAL POLYP);  Surgeon: Jonnie Kind, MD;  Location: [facility];  Service: Gynecology;  Laterality: N/A;  . Rectocele repair N/A 11/06/2013    Procedure: POSTERIOR REPAIR (RECTOCELE);  Surgeon: Jonnie Kind, MD;  Location: [facility];  Service: Gynecology;  Laterality: N/A;    Allergies  Allergen Reactions  . Arimidex [Anastrozole] Other (See Comments)    Arthralgias and myalgias.  Improved with 3 week hiatus from drug.  Categorical side effect of drug class.  . Adhesive [Tape] Other  (See Comments)    "red and blistered"  . Lisinopril     Dry hacking cough and tickle  . Tetracyclines & Related Other (See Comments)    unknown    Current Outpatient Prescriptions on File Prior to Visit  Medication Sig Dispense Refill  . acetaminophen (TYLENOL) 500 MG tablet Take 500 mg by mouth as needed for moderate pain. Takes 2 when needed    . acyclovir (ZOVIRAX) 200 MG capsule Take 200 mg by mouth daily as needed (herpes).    Marland Kitchen albuterol (PROVENTIL HFA;VENTOLIN HFA) 108 (90 BASE) MCG/ACT inhaler Inhale 2 puffs into the lungs every 4 (four) hours as needed. 6.7 g 6  . Aloe Vera Leaf POWD Apply 800 mg topically 2 (two) times daily.     . Ascorbic Acid (VITAMIN C) 1000 MG tablet Take 1,000 mg by mouth 2 (two) times daily.    Marland Kitchen b complex vitamins tablet Take 1 tablet by mouth 2 (two) times daily.     . Cholecalciferol (VITAMIN D3) 2000 UNITS TABS Take 2,000 Units by mouth 2 (two) times daily.    . ciprofloxacin (CIPRO) 500 MG tablet Take 1 tablet (500 mg total) by mouth 2 (two) times daily. 10 tablet 14  . Docusate Calcium (STOOL SOFTENER PO) Take 1 capsule by mouth as needed (constipation).     . DULERA 100-5 MCG/ACT AERO INHALE 2 PUFFS BY MOUTH TWICE DAILY 13 g 4  . Echinacea 380 MG CAPS Take 1 capsule by mouth as needed (cold/flue).    . fluocinonide cream (LIDEX) 5.09 % Apply 1 application topically 2 (two) times daily as needed (poison ivy/bites/hives). 30 g 11  . furosemide (LASIX) 20 MG tablet Take 1 tablet (20 mg total) by mouth daily. 30 tablet 3  . Ginkgo Biloba 120 MG CAPS Take 1 capsule by mouth daily.     Marland Kitchen glipiZIDE (GLUCOTROL) 10 MG tablet TAKE 1 TABLET BY MOUTH TWICE DAILY BEFORE A MEAL 180 tablet 0  . guaiFENesin-codeine 100-10 MG/5ML syrup Take 5 mLs by mouth every 6 (six) hours as needed for cough. 180 mL 0  . HYDROcodone-acetaminophen (NORCO/VICODIN) 5-325 MG per tablet Take 1 tablet by mouth every 4 (four) hours as needed. 15 tablet 0  . ibuprofen (ADVIL,MOTRIN) 800  MG tablet Take 800 mg by mouth daily as needed for moderate pain.    Marland Kitchen JANUVIA 50 MG tablet TAKE 1 TABLET BY MOUTH DAILY 90 tablet 3  . ketoconazole (NIZORAL) 2 % cream Apply 1 application  topically 2 (two) times daily as needed.    Marland Kitchen KRILL OIL PO Take 1 capsule by mouth 2 (two) times daily.    . Magnesium 400 MG TABS Take 400 mg by mouth daily.    . Melatonin 3 MG TABS Take 3 mg by mouth at bedtime as needed and may repeat dose one time if needed (sleep).     . metFORMIN (GLUCOPHAGE) 1000 MG tablet TAKE 1 TABLET BY MOUTH TWICE DAILY WITH A MEAL 180 tablet 0  . montelukast (SINGULAIR) 10 MG tablet TAKE 1 TABLET BY MOUTH EVERY NIGHT AT BEDTIME 90 tablet 3  . Multiple Vitamin (MULTIVITAMIN WITH MINERALS) TABS tablet Take 1 tablet by mouth daily.    . Naftifine HCl 1 % GEL Apply 1 application topically daily as needed.    . naproxen (NAPROSYN) 500 MG tablet Take 1 tablet (500 mg total) by mouth 2 (two) times daily as needed. 180 tablet 1  . NONFORMULARY OR COMPOUNDED ITEM Apply 1 application topically 2 (two) times daily as needed. Ketoconazole & fluticasone cream 1:1    . simethicone (MYLICON) 80 MG chewable tablet Chew 80 mg by mouth as needed for flatulence.     . simvastatin (ZOCOR) 40 MG tablet TAKE 1 TABLET BY MOUTH EVERY EVENING 90 tablet 0  . sitaGLIPtin (JANUVIA) 50 MG tablet Take 25 mg by mouth daily.    . tamoxifen (NOLVADEX) 20 MG tablet Take 1 tablet (20 mg total) by mouth at bedtime. 30 tablet 11  . tiZANidine (ZANAFLEX) 4 MG tablet TAKE 1 TABLET BY MOUTH EVERY NIGHT AT BEDTIME AS NEEDED FOR MUSCLE SPASMS 90 tablet 2  . triamterene-hydrochlorothiazide (MAXZIDE) 75-50 MG per tablet TAKE 1 TABLET BY MOUTH EVERY DAY 90 tablet 0   No current facility-administered medications on file prior to visit.        Objective:   Physical Exam  Blood pressure 136/58, pulse 88, temperature 97.5 F (36.4 C), height '5\' 5"'$  (1.651 m), weight 239 lb (108.41 kg). Alert and oriented. Skin warm and dry.  Oral mucosa is moist.   . Sclera anicteric, conjunctivae is pink. Thyroid not enlarged. No cervical lymphadenopathy. Lungs clear. Heart regular rate and rhythm.  Abdomen is soft. Bowel sounds are positive. No hepatomegaly. No abdominal masses felt. No tenderness.  No edema to lower extremities.         Assessment & Plan:  Fatty liver. Elevated liver enzymes. Am going to rule out an auto immune process, Hepatitis B and C.  CBC, Hepatic profile, PT/INR, Alpha 1 antitrypsin, Hepatitis B surface antigen, Hep C antibody, Ferritin, CRP, ANA, SMA, mitochondrial antibody.  Patient needs to diet and exercise. Further recommendations to follow.

## 2014-12-04 NOTE — Telephone Encounter (Signed)
.  Per Terri Setzer,NP patient to have labs. 

## 2014-12-05 LAB — ANA: ANA: NEGATIVE

## 2014-12-05 LAB — HEPATIC FUNCTION PANEL
ALK PHOS: 47 U/L (ref 33–130)
ALT: 56 U/L — AB (ref 6–29)
AST: 58 U/L — AB (ref 10–35)
Albumin: 3.8 g/dL (ref 3.6–5.1)
Total Bilirubin: 0.3 mg/dL (ref 0.2–1.2)
Total Protein: 6.4 g/dL (ref 6.1–8.1)

## 2014-12-05 LAB — FERRITIN: FERRITIN: 75 ng/mL (ref 10–291)

## 2014-12-05 LAB — PROTIME-INR
INR: 1.02 (ref <1.50)
Prothrombin Time: 13.4 seconds (ref 11.6–15.2)

## 2014-12-05 LAB — HEPATITIS C ANTIBODY: HCV AB: NEGATIVE

## 2014-12-05 LAB — HEPATITIS B SURFACE ANTIGEN: HEP B S AG: NEGATIVE

## 2014-12-05 LAB — C-REACTIVE PROTEIN: CRP: 1.6 mg/dL — AB (ref <0.60)

## 2014-12-06 ENCOUNTER — Ambulatory Visit (HOSPITAL_COMMUNITY): Payer: Medicare Other

## 2014-12-06 LAB — ALPHA-1-ANTITRYPSIN: A-1 Antitrypsin, Ser: 212 mg/dL — ABNORMAL HIGH (ref 83–199)

## 2014-12-06 LAB — MITOCHONDRIAL ANTIBODIES: Mitochondrial M2 Ab, IgG: 0.26 (ref <0.91)

## 2014-12-06 LAB — ANTI-SMOOTH MUSCLE ANTIBODY, IGG: Smooth Muscle Ab: 7 U (ref <20)

## 2014-12-09 ENCOUNTER — Ambulatory Visit (HOSPITAL_COMMUNITY): Payer: Medicare Other | Admitting: Physical Therapy

## 2014-12-12 ENCOUNTER — Telehealth (INDEPENDENT_AMBULATORY_CARE_PROVIDER_SITE_OTHER): Payer: Self-pay | Admitting: *Deleted

## 2014-12-12 ENCOUNTER — Encounter (INDEPENDENT_AMBULATORY_CARE_PROVIDER_SITE_OTHER): Payer: Self-pay | Admitting: *Deleted

## 2014-12-12 DIAGNOSIS — R748 Abnormal levels of other serum enzymes: Secondary | ICD-10-CM

## 2014-12-12 NOTE — Telephone Encounter (Signed)
.  Per Terri Setzer,NP patient to have labs in 3 months. 

## 2014-12-13 ENCOUNTER — Ambulatory Visit (HOSPITAL_COMMUNITY): Payer: Medicare Other

## 2014-12-16 ENCOUNTER — Ambulatory Visit (HOSPITAL_COMMUNITY): Payer: Medicare Other

## 2014-12-20 ENCOUNTER — Ambulatory Visit (HOSPITAL_COMMUNITY): Payer: Medicare Other | Admitting: Physical Therapy

## 2014-12-21 ENCOUNTER — Other Ambulatory Visit: Payer: Self-pay | Admitting: Family Medicine

## 2014-12-24 NOTE — Telephone Encounter (Signed)
Medication refilled per protocol. 

## 2014-12-27 ENCOUNTER — Ambulatory Visit (HOSPITAL_COMMUNITY): Payer: Medicare Other

## 2014-12-30 ENCOUNTER — Ambulatory Visit (HOSPITAL_COMMUNITY): Payer: Medicare Other | Admitting: Physical Therapy

## 2015-01-02 IMAGING — MG MM LT BREAST BX W LOC DEV 1ST LESION
3 series · 3 of 3 positions shown · non-contrast
Comparison: Previous exams.

ADDENDUM:
The pathology associated with the left breast stereotactic core
biopsy demonstrated atypical ductal hyperplasia. This is concordant
with the imaging. I have discussed the findings with the patient and
answered her questions. Surgical excision is recommended. The
patient has been scheduled to see Dr. Hugo Danilo on 07/17/2013. Post
biopsy wound care instructions were reviewed with the patient.
Patient was encouraged to call the [REDACTED] for additional
questions or concerns.
CLINICAL DATA: Suspicious left breast calcifications for biopsy.

EXAM:
LEFT STEREOTACTIC CORE NEEDLE BIOPSY

[L SPECIMEN]
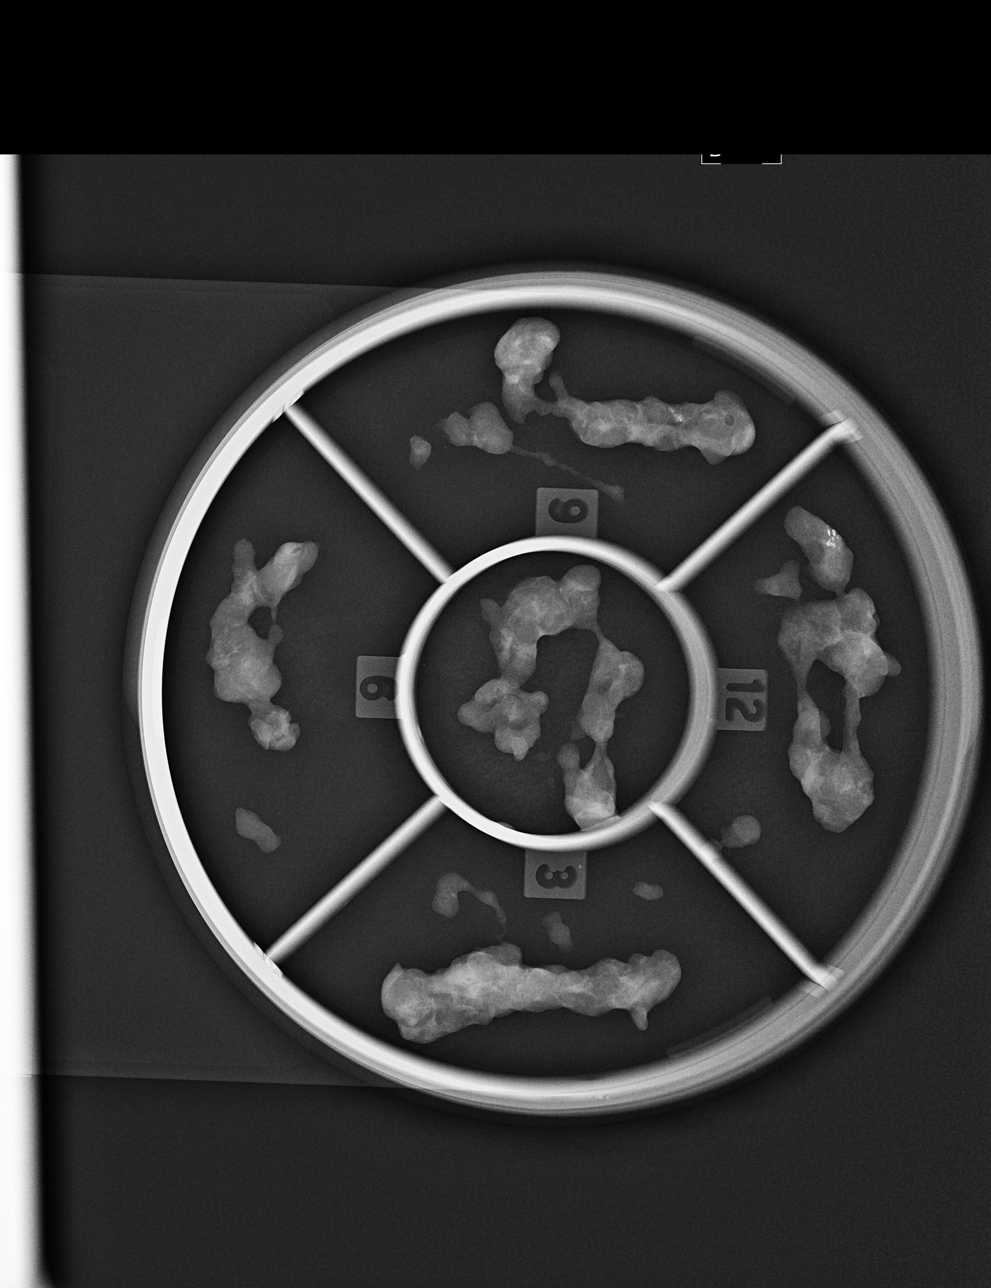

[L CC]
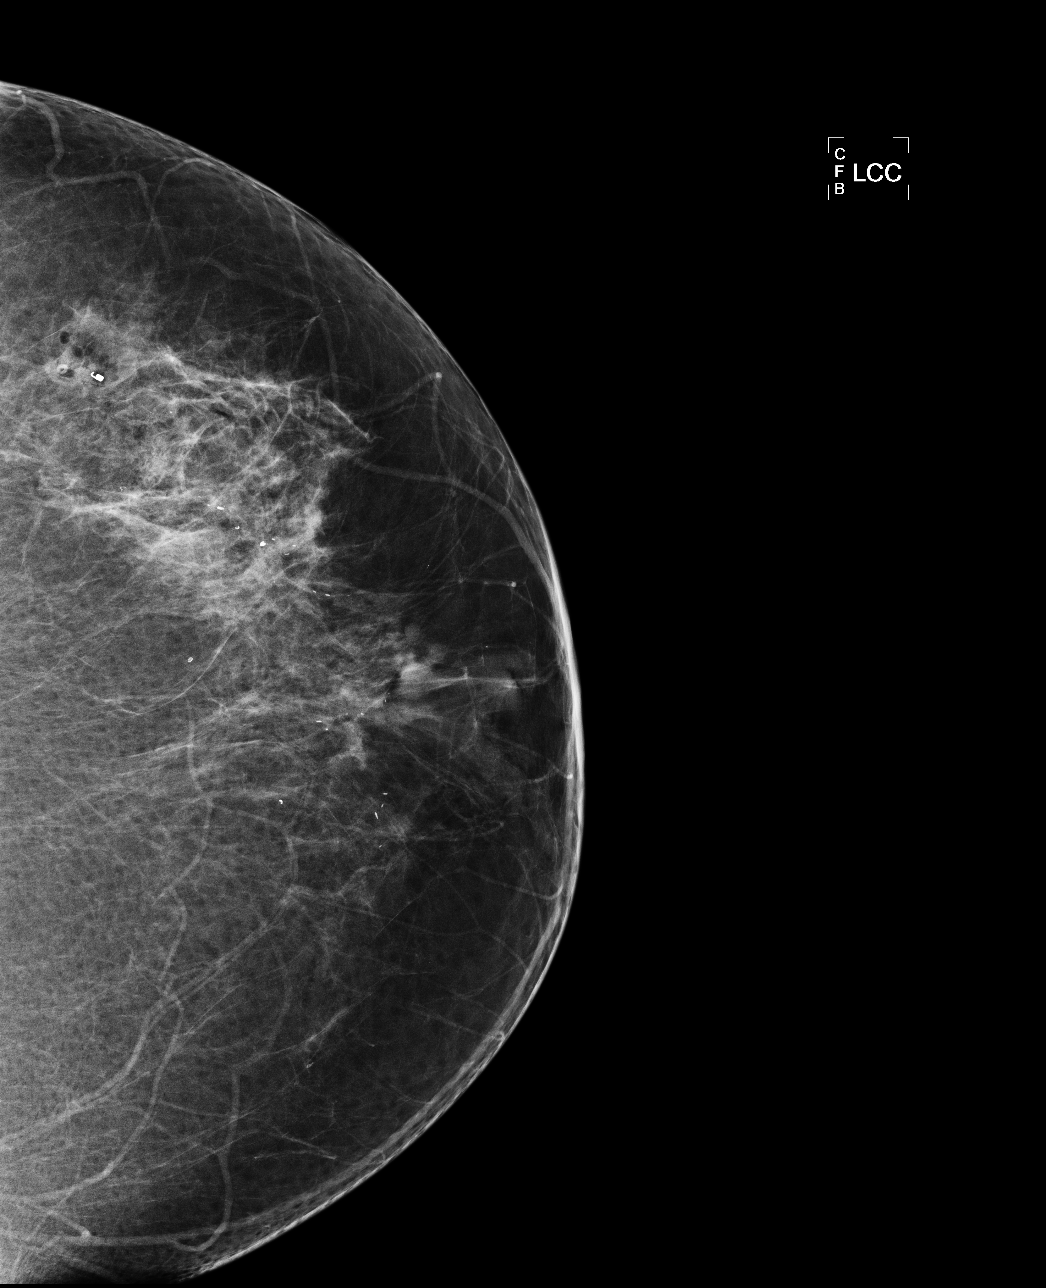

[L ML]
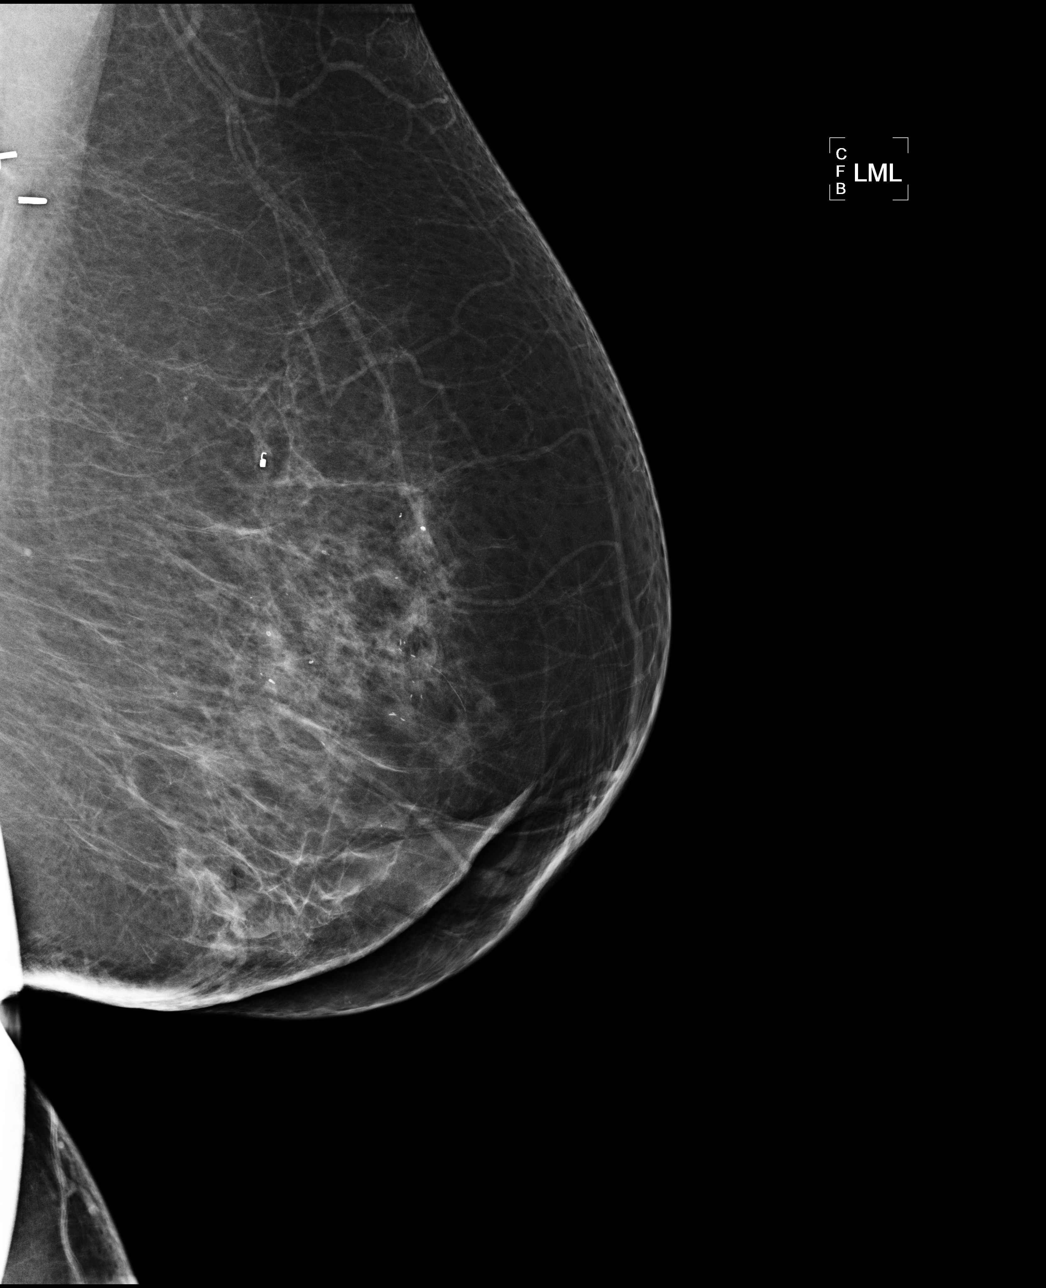

[3 of 3 positions shown; findings below may reference images not displayed]



Using sterile technique and 2% Lidocaine as local anesthetic, under
stereotactic guidance, a 9 gauge vacuum assisted device was used to
perform core needle biopsy of calcifications in the lateral left
breast using a caudal approach. Specimen radiograph was performed
showing inclusion the calcifications of concern. Specimens with
calcifications are identified for pathology.

At the conclusion of the procedure, a hook shaped tissue marker clip
was deployed into the biopsy cavity. Follow-up 2-view mammogram
confirmed clip in the area of concern.
IMPRESSION: Stereotactic-guided biopsy of left breast. No apparent
complications.

## 2015-01-06 ENCOUNTER — Other Ambulatory Visit: Payer: Self-pay | Admitting: Family Medicine

## 2015-01-06 NOTE — Telephone Encounter (Signed)
Refill appropriate and filled per protocol. 

## 2015-02-10 ENCOUNTER — Other Ambulatory Visit (HOSPITAL_COMMUNITY): Payer: Self-pay | Admitting: Oncology

## 2015-02-10 DIAGNOSIS — C50912 Malignant neoplasm of unspecified site of left female breast: Secondary | ICD-10-CM

## 2015-02-10 MED ORDER — TAMOXIFEN CITRATE 20 MG PO TABS: 20.0000 mg | ORAL_TABLET | Freq: Every day | ORAL | Status: DC

## 2015-02-13 ENCOUNTER — Other Ambulatory Visit (INDEPENDENT_AMBULATORY_CARE_PROVIDER_SITE_OTHER): Payer: Self-pay | Admitting: *Deleted

## 2015-02-13 ENCOUNTER — Encounter (INDEPENDENT_AMBULATORY_CARE_PROVIDER_SITE_OTHER): Payer: Self-pay | Admitting: *Deleted

## 2015-02-13 ENCOUNTER — Telehealth: Payer: Self-pay | Admitting: *Deleted

## 2015-02-13 DIAGNOSIS — R748 Abnormal levels of other serum enzymes: Secondary | ICD-10-CM

## 2015-02-13 DIAGNOSIS — E119 Type 2 diabetes mellitus without complications: Secondary | ICD-10-CM

## 2015-02-13 DIAGNOSIS — I10 Essential (primary) hypertension: Secondary | ICD-10-CM

## 2015-02-13 DIAGNOSIS — E785 Hyperlipidemia, unspecified: Secondary | ICD-10-CM

## 2015-02-13 NOTE — Telephone Encounter (Signed)
Pt called stating she is needing lab work order sent to [facility] in [place] please for her appt coming up on Mon Oct 31  If any questions [phone] (H)

## 2015-02-13 NOTE — Telephone Encounter (Signed)
Labs ordered and faxed to [facility].   Call placed to patient and patient made aware per VM.

## 2015-02-14 ENCOUNTER — Ambulatory Visit: Payer: Medicare Other | Admitting: Family Medicine

## 2015-02-14 ENCOUNTER — Other Ambulatory Visit: Payer: Self-pay | Admitting: Family Medicine

## 2015-02-14 DIAGNOSIS — E119 Type 2 diabetes mellitus without complications: Secondary | ICD-10-CM | POA: Diagnosis not present

## 2015-02-14 DIAGNOSIS — I10 Essential (primary) hypertension: Secondary | ICD-10-CM | POA: Diagnosis not present

## 2015-02-14 DIAGNOSIS — E785 Hyperlipidemia, unspecified: Secondary | ICD-10-CM | POA: Diagnosis not present

## 2015-02-14 LAB — CBC WITH DIFFERENTIAL/PLATELET
BASOS ABS: 0 10*3/uL (ref 0.0–0.1)
Basophils Relative: 0 % (ref 0–1)
EOS ABS: 0.2 10*3/uL (ref 0.0–0.7)
EOS PCT: 2 % (ref 0–5)
HEMATOCRIT: 40 % (ref 36.0–46.0)
Hemoglobin: 13 g/dL (ref 12.0–15.0)
LYMPHS ABS: 3.1 10*3/uL (ref 0.7–4.0)
LYMPHS PCT: 33 % (ref 12–46)
MCH: 27 pg (ref 26.0–34.0)
MCHC: 32.5 g/dL (ref 30.0–36.0)
MCV: 83.2 fL (ref 78.0–100.0)
MONO ABS: 0.8 10*3/uL (ref 0.1–1.0)
MONOS PCT: 8 % (ref 3–12)
MPV: 10.4 fL (ref 8.6–12.4)
Neutro Abs: 5.4 10*3/uL (ref 1.7–7.7)
Neutrophils Relative %: 57 % (ref 43–77)
PLATELETS: 253 10*3/uL (ref 150–400)
RBC: 4.81 MIL/uL (ref 3.87–5.11)
RDW: 14.1 % (ref 11.5–15.5)
WBC: 9.5 10*3/uL (ref 4.0–10.5)

## 2015-02-14 LAB — COMPLETE METABOLIC PANEL WITH GFR
ALT: 43 U/L — AB (ref 6–29)
AST: 48 U/L — AB (ref 10–35)
Albumin: 3.8 g/dL (ref 3.6–5.1)
Alkaline Phosphatase: 43 U/L (ref 33–130)
BILIRUBIN TOTAL: 0.4 mg/dL (ref 0.2–1.2)
BUN: 22 mg/dL (ref 7–25)
CHLORIDE: 101 mmol/L (ref 98–110)
CO2: 29 mmol/L (ref 20–31)
CREATININE: 1.19 mg/dL — AB (ref 0.60–0.93)
Calcium: 9.3 mg/dL (ref 8.6–10.4)
GFR, Est African American: 53 mL/min — ABNORMAL LOW (ref >=60)
GFR, Est Non African American: 46 mL/min — ABNORMAL LOW (ref >=60)
GLUCOSE: 122 mg/dL — AB (ref 70–99)
Potassium: 4.1 mmol/L (ref 3.5–5.3)
Sodium: 141 mmol/L (ref 135–146)
Total Protein: 6.4 g/dL (ref 6.1–8.1)

## 2015-02-14 LAB — LIPID PANEL
Cholesterol: 154 mg/dL (ref 125–200)
HDL: 42 mg/dL — AB (ref >=46)
LDL CALC: 67 mg/dL (ref <130)
Total CHOL/HDL Ratio: 3.7 Ratio (ref <=5.0)
Triglycerides: 223 mg/dL — ABNORMAL HIGH (ref <150)
VLDL: 45 mg/dL — ABNORMAL HIGH (ref <30)

## 2015-02-14 LAB — HEMOGLOBIN A1C
Hgb A1c MFr Bld: 7.1 % — ABNORMAL HIGH (ref <5.7)
MEAN PLASMA GLUCOSE: 157 mg/dL — AB (ref <117)

## 2015-02-16 ENCOUNTER — Other Ambulatory Visit: Payer: Self-pay | Admitting: Family Medicine

## 2015-02-17 ENCOUNTER — Ambulatory Visit (INDEPENDENT_AMBULATORY_CARE_PROVIDER_SITE_OTHER): Payer: Medicare Other | Admitting: Family Medicine

## 2015-02-17 VITALS — BP 134/60 | HR 78 | Temp 97.9°F | Resp 16 | Ht 65.0 in | Wt 234.0 lb

## 2015-02-17 DIAGNOSIS — E785 Hyperlipidemia, unspecified: Secondary | ICD-10-CM

## 2015-02-17 DIAGNOSIS — Z23 Encounter for immunization: Secondary | ICD-10-CM

## 2015-02-17 DIAGNOSIS — E119 Type 2 diabetes mellitus without complications: Secondary | ICD-10-CM

## 2015-02-17 DIAGNOSIS — R252 Cramp and spasm: Secondary | ICD-10-CM | POA: Diagnosis not present

## 2015-02-17 DIAGNOSIS — I890 Lymphedema, not elsewhere classified: Secondary | ICD-10-CM | POA: Diagnosis not present

## 2015-02-17 DIAGNOSIS — I10 Essential (primary) hypertension: Secondary | ICD-10-CM | POA: Diagnosis not present

## 2015-02-17 NOTE — Progress Notes (Signed)
Patient ID: Julie Clay, female   DOB: 07/01/1940, 74 y.o.   MRN: [id number]   Subjective:    Patient ID: Julie Clay, female    DOB: 20-May-1940, 74 y.o.   MRN: [id number]  Patient presents for 3 month F/U   patient here to follow chronic medical problems and to review her fasting labs. She has history of diabetes mellitus her A1c returned at 7.1% she has not been checking her blood sugars very often. Her main complaint is she continues to get leg cramps on and off. She is on magnesium we have decreased her statin drug she uses Zanaflex a few times a week which helps her sleep at night. Her potassium has been regulated. She does note when she eats things with cranberry sauce or cherries that she will get the cramping more.   She continues to have problems with her  Lymphedema in her left arm status post breast cancer surgery and no removal. She needs a custom sleeve ordered.   She is following with a chiropractor which helps her joint pains.  HTN- home BP readings - 120-160/60-88  Review Of Systems:  GEN- denies fatigue, fever, weight loss,weakness, recent illness HEENT- denies eye drainage, change in vision, nasal discharge, CVS- denies chest pain, palpitations RESP- denies SOB, cough, wheeze ABD- denies N/V, change in stools, abd pain GU- denies dysuria, hematuria, dribbling, incontinence MSK- denies joint pain, muscle aches, injury Neuro- denies headache, dizziness, syncope, seizure activity       Objective:    BP 134/60 mmHg  Pulse 78  Temp(Src) 97.9 F (36.6 C) (Oral)  Resp 16  Ht '5\' 5"'$  (1.651 m)  Wt 234 lb (106.142 kg)  BMI 38.94 kg/m2 GEN- NAD, alert and oriented x3 HEENT- PERRL, EOMI, non injected sclera, pink conjunctiva, MMM, oropharynx clear Neck- Supple, no thyromegaly CVS- RRR, no murmur RESP-CTAB ABD-NABS,soft,NT,ND EXT- pedal edema Pulses- Radial, DP- 2+        Assessment & Plan:      Problem List Items Addressed This Visit    Hyperlipidemia   Essential hypertension, benign    Other Visit Diagnoses    Need for prophylactic vaccination and inoculation against influenza    -  Primary    Relevant Orders    Flu Vaccine QUAD 36+ mos PF IM (Fluarix & Fluzone Quad PF) (Completed)    Diabetes mellitus without complication ([place])           Note: This dictation was prepared with Dragon dictation along with smaller phrase technology. Any transcriptional errors that result from this process are unintentional.

## 2015-02-17 NOTE — Patient Instructions (Signed)
Hold the simvastatin for 2 weeks- we will call about your cramps Labs look good  New Sleeve for Lymphedema  Refill on zanaflex  F/U 3 months

## 2015-02-17 NOTE — Telephone Encounter (Signed)
Refill appropriate and filled per protocol. 

## 2015-02-18 DIAGNOSIS — I890 Lymphedema, not elsewhere classified: Secondary | ICD-10-CM | POA: Insufficient documentation

## 2015-02-18 NOTE — Assessment & Plan Note (Signed)
Custom lymphedema sleeve needed

## 2015-02-18 NOTE — Assessment & Plan Note (Signed)
Continue zanaflex, will stop the zocor all together to see if this helps

## 2015-02-18 NOTE — Assessment & Plan Note (Signed)
TG still elevated, she has fatty liver, but concer about the cholesterol meds causing leg pain so will give trial off for a few weeks , low fat diet stressed, labs forwarded to GI

## 2015-02-18 NOTE — Assessment & Plan Note (Signed)
Controlled, no change to meds

## 2015-02-20 ENCOUNTER — Telehealth: Payer: Self-pay | Admitting: *Deleted

## 2015-02-20 NOTE — Telephone Encounter (Signed)
Patient seen in office on 02/18/2015 and states that she requires new custom fit lymphedema sleeve for L arm.   Call placed to [facility] to request information on having patient fitted for sleeve. Advised to contact Rexford Maus with [facility]. [phone] ph/ [phone] fax.   Order faxed.

## 2015-03-07 ENCOUNTER — Telehealth: Payer: Self-pay | Admitting: *Deleted

## 2015-03-07 NOTE — Telephone Encounter (Signed)
Received call from patient.   Reports that she ans her husband continue to receive automated calls from [facility] in regards to need for flu vaccine.   Patient and husband received vaccine on 02/17/2015.  Can you look into this?

## 2015-03-07 NOTE — Telephone Encounter (Signed)
-----   Message from Alycia Rossetti, MD sent at 03/07/2015  1:12 PM EST ----- Regarding: FW: F/U leg cramps off zocor Call and see if leg cramps improved off of cholesterol medication? Put in chart  ----- Message -----    From: Alycia Rossetti, MD    Sent: 03/07/2015      To: Alycia Rossetti, MD Subject: F/U leg cramps off zocor

## 2015-03-07 NOTE — Telephone Encounter (Signed)
Call placed to patient and patient made aware.  

## 2015-03-07 NOTE — Telephone Encounter (Signed)
For now stay off the zocor, continue the krill oil

## 2015-03-07 NOTE — Telephone Encounter (Signed)
Call placed to patient.   Reports that cramps are resolved since stopping Zocor.

## 2015-03-17 ENCOUNTER — Other Ambulatory Visit: Payer: Self-pay | Admitting: Family Medicine

## 2015-03-17 NOTE — Telephone Encounter (Signed)
Refill appropriate and filled per protocol. 

## 2015-03-19 NOTE — Progress Notes (Signed)
Julie Blackbird, MD [address] 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   INTERVAL HISTORY: Julie Clay 74 y.o. 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  Started on Arimidex in Feb or March 2013 but developed intolerance to this medication.  She was therefore switched to Tamoxifen in Jan 2014.  She will take anti-endocrine therapy for 5 years.  She declined radiation therapy at the time of presentation in December 2012. She attributes her oncotype score to seeing a naturopathic physician at the time of her cancer diagnosis. She was given mushroom supplements, and they suggested to cut out white flour and eat a cup of berries each day.  She states she has been trying to loose weight. Since April she has lost 10 pounds.   She has lymphedema of the LUE, she feels that it began after her shoulder surgery, not the breast cancer. She is wearing a pressure sleeve on her left arm. Her husband massages her lymphedema every night like they showed how to do at the clinic.  She is up to date on her mammograms.  Her primary care doctor is Dr. Buelah Clay.   She takes vitamin D but not calcium. She states she drinks a lot of milk every day.  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  D/C'd due to arthralgias and myalgias   04/19/2012 -  Chemotherapy Tamoxifen started    Past Medical History  Diagnosis Date  . Hypertension   . Hyperlipidemia   . Diabetes mellitus   . Asthma     spirometry  (2011)- mild ventilary defect  . Cancer ([place]) 2012    Breast  . Arthritis     Osteoarthritis  . Fatty liver 2013    enlarged  . Sliding hiatal hernia   . Ventral hernia, unspecified, without mention of obstruction or gangrene   . Lung nodule seen on imaging study 2013  . HSV (herpes simplex virus) infection   . Lymphedema     Left arm  . 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 Calcification of left breast   . Arthritis   . Shortness of breath     with exertion  . Sleep apnea     Stop Bang score of 5. Pt has had sleep study, but was shown to be negative for sleep apnea.  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 ([place]); Essential hypertension, benign; DM (diabetes mellitus) ([place]); OA (osteoarthritis); Morbidly obese ([place]); Asthma; Hyperlipidemia; Chronic fatigue; OSA (obstructive sleep apnea); Dyspnea; HSV (herpes simplex virus) infection; Peripheral edema; Pain in joint, shoulder region; Rotator cuff syndrome of left shoulder; Muscle weakness (generalized); Occupational therapy encounter; Hip pain; Insomnia; Pain, joint, multiple sites; CKD (chronic kidney disease), stage II; Diabetic neuropathy ([place]); Pre-ulcerative calluses; Pelvic pain; Bladder prolapse, female, acquired; Endometrial polyp; Rectocele; Atypical ductal hyperplasia of left breast; Palpitations; Post-operative state; Skin irritation; Elevated LFTs; Leg cramps; Ganglion cyst  of flexor tendon sheath of finger of left hand; and Lymphedema on her problem list.     is allergic to arimidex; adhesive; lisinopril; and tetracyclines & related.  Julie Clay had no medications administered during this visit.  Past Surgical History  Procedure Laterality Date  . Cesarean section    . 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 Breast surgery  jan 2013  . Hand ligament reconstruction Right   . Cataract extraction, bilateral      on different  occassions   . Shoulder arthroscopy with open rotator cuff repair Left 07/26/2012  . Breast biopsy  06/22/13  . Breast biopsy Left 09/12/2013    Procedure: BREAST BIOPSY WITH NEEDLE LOCALIZATION;  Surgeon: Jamesetta So, MD;  Location: [facility];  Service: General;  Laterality: Left;  . Hysteroscopy w/d&c N/A 11/06/2013    Procedure: DILATATION AND CURETTAGE /HYSTEROSCOPY;  Surgeon: Jonnie Kind, MD;  Location: [facility];  Service: Gynecology;  Laterality: N/A;  . Polypectomy N/A 11/06/2013    Procedure: POLYPECTOMY (REMOVAL ENDOMETRIAL POLYP);  Surgeon: Jonnie Kind, MD;  Location: [facility];  Service: Gynecology;  Laterality: N/A;  . Rectocele repair N/A 11/06/2013    Procedure: POSTERIOR REPAIR (RECTOCELE);  Surgeon: Jonnie Kind, MD;  Location: [facility];  Service: Gynecology;  Laterality: N/A;    Denies any headaches, dizziness, double vision, fevers, chills, night sweats, nausea, vomiting, diarrhea, constipation, chest pain, heart palpitations, shortness of breath, blood in stool, black tarry stool, urinary pain, urinary burning, urinary frequency, hematuria. Denies vaginal bleeding. 14 point review of systems was performed and is negative except as detailed under history of present illness and above    PHYSICAL EXAMINATION  ECOG PERFORMANCE STATUS: 1 - Symptomatic but completely ambulatory  Filed Vitals:   03/20/15 1200  BP: 138/57  Pulse: 101  Temp: 98.5 F (36.9 C)  Resp: 20    GENERAL:alert, no distress, well nourished, well developed, comfortable, cooperative, obese and smiling SKIN: skin color, texture, turgor are normal, no rashes or significant lesions HEAD: Normocephalic, No masses, lesions, tenderness or abnormalities EYES: normal, PERRLA, EOMI, Conjunctiva are pink and non-injected EARS: External ears normal OROPHARYNX:mucous membranes are moist  NECK: supple, no adenopathy, thyroid normal size, non-tender, without nodularity, no stridor, non-tender, trachea  midline LYMPH:  no palpable lymphadenopathy LUNGS: clear to auscultation and percussion HEART: regular rate & rhythm, SEM, no gallops, S1 normal and S2 normal ABDOMEN:obese and normal bowel sounds BACK: Back symmetric, no curvature. EXTREMITIES:less then 2 second capillary refill, no joint deformities, effusion, or inflammation, no skin discoloration, no cyanosis  NEURO: alert & oriented x 3 with fluent speech, no focal motor/sensory deficits, gait normal   LABORATORY DATA: I have reviewed the data as listed. CBC    Component Value Date/Time   WBC 9.5 02/14/2015 0933   RBC 4.81 02/14/2015 0933   HGB 13.0 02/14/2015 0933   HCT 40.0 02/14/2015 0933   PLT 253 02/14/2015 0933   MCV 83.2 02/14/2015 0933   MCH 27.0 02/14/2015 0933   MCHC 32.5 02/14/2015 0933   RDW 14.1 02/14/2015 0933   LYMPHSABS 3.1 02/14/2015 0933   MONOABS 0.8 02/14/2015 0933   EOSABS 0.2 02/14/2015 0933   BASOSABS 0.0 02/14/2015 0933      Chemistry      Component Value Date/Time   NA 141 02/14/2015 0933   K 4.1 02/14/2015 0933   CL 101 02/14/2015 0933   CO2 29 02/14/2015 0933   BUN 22  02/14/2015 0933   CREATININE 1.19* 02/14/2015 0933   CREATININE 1.02* 11/07/2014 0837      Component Value Date/Time   CALCIUM 9.3 02/14/2015 0933   ALKPHOS 43 02/14/2015 0933   AST 48* 02/14/2015 0933   ALT 43* 02/14/2015 0933   BILITOT 0.4 02/14/2015 0933        ASSESSMENT:  1. Left-sided stage I cancer the breast infiltrating ductal type ER positive, PR positive, HER-2/neu nonamplified with an Oncotype DX score 0.  Started on Arimidex in Feb or March 2013 but developed intolerance to this medication.  She was therefore switched to Tamoxifen in Jan 2014.  She will take anti-endocrine therapy for 5 years.  She declined radiation therapy at the time of presentation in December 2012.  2.  Left breast atypical lobular hyperplasia, S/P lumpectomy by Dr. Arnoldo Morale on 09/12/2013 3. Left full-thickness supraspinatus tear with  mild partial-thickness superior infraspinatus tear requiring arthroscopic intervention in April 2014  4. Multiple left breast biopsies (March 2015 and May 2015).Negative work-up. 5. Elevated LFT's  Patient Active Problem List   Diagnosis Date Noted  . Lymphedema 02/18/2015  . Leg cramps 08/02/2014  . Ganglion cyst of flexor tendon sheath of finger of left hand 08/02/2014  . Elevated LFTs 04/28/2014  . Skin irritation 01/19/2014  . Post-operative state 11/14/2013  . Palpitations 10/12/2013  . Atypical ductal hyperplasia of left breast 07/12/2013  . Endometrial polyp 06/25/2013  . Rectocele 06/25/2013  . Diabetic neuropathy ([place]) 05/09/2013  . Pre-ulcerative calluses 05/09/2013  . Pelvic pain 05/09/2013  . Bladder prolapse, female, acquired 05/09/2013  . Pain, joint, multiple sites 03/26/2013  . 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 Insomnia 11/29/2012  . Hip pain 09/08/2012  . Occupational therapy encounter 08/30/2012  . Muscle weakness (generalized) 06/26/2012  . Rotator cuff syndrome of left shoulder 06/21/2012  . Pain in joint, shoulder region 06/16/2012  . Peripheral edema 02/04/2012  . HSV (herpes simplex virus) infection 12/09/2011  . Dyspnea 11/15/2011  . 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 Essential hypertension, benign 10/11/2011  . DM (diabetes mellitus) ([place]) 10/11/2011  . OA (osteoarthritis) 10/11/2011  . Morbidly obese ([place]) 10/11/2011  . Asthma 10/11/2011  . Hyperlipidemia 10/11/2011  . Chronic fatigue 10/11/2011  . OSA (obstructive sleep apnea) 10/11/2011   RADIOLOGY: I have personally reviewed the radiological images as listed and agreed with the findings in the report. CLINICAL DATA: Elevated liver function tests, history of breast carcinoma  EXAM: ULTRASOUND ABDOMEN COMPLETE  COMPARISON: None.  FINDINGS: Gallbladder: The gallbladder is visualized and no gallstones are noted. There is no  pain over the gallbladder with compression.  Common bile duct: Diameter: The common bile duct is partially obscured by bowel gas with the best measurement being 4.1 mm.  Liver: The liver is inhomogeneous and echogenic consistent with diffuse fatty infiltration. No focal hepatic abnormality is seen. The liver appears enlarged measuring 22 cm sagittally.  IVC: No abnormality visualized.  Pancreas: Portions of the head and tail of the pancreas are obscured by bowel gas.  Spleen: The spleen measures 10.0 cm. There may be a small calcified granuloma present within the spleen.  Right Kidney: Length: 11.8 cm. No hydronephrosis is seen.  Left Kidney: Length: 11.2 cm. No hydronephrosis is noted.  Abdominal aorta: The abdominal aorta is partially obscured by bowel gas but no focal aneurysm is seen.  Other findings: This study is somewhat compromised by body habitus and bowel gas.  IMPRESSION: 1.  Echogenic inhomogeneous liver parenchyma consistent with fatty infiltration. No focal hepatic abnormality is seen. 2. No gallstones. 3. Hepatomegaly. 4. Some of the anatomy particularly the pancreas and abdominal aorta are obscured by bowel gas and body habitus.   Electronically Signed  By: Ivar Drape M.D.  On: 11/14/2014 09:14  PLAN:  She will continue with her Tamoxifen daily. She is up to date on screening mammography. I have ordered her mammogram for March, she will need a breast exam on return in 6 months. She takes vitamin D but not calcium.  She follows with her primary care physician as required. She has minimal lymphedema on exam today. She should continue wearing her sleeve and with massage as instructed.  Ongoing weight loss was encouraged.   She will return in 6 months with repeat Physical Exam.   THERAPY PLAN:  NCCN guidelines recommends the following surveillance for invasive breast cancer:  A. History and Physical exam every 4-6 months for 5 years and  then every 12 months.  B. Mammography every 12 months  C. Women on Tamoxifen: annual gynecologic assessment every 12 months if uterus is present.  D. Women on aromatase inhibitor or who experience ovarian failure secondary to treatment should have monitoring of bone health with a bone mineral density determination at baseline and periodically thereafter.  E. Assess and encourage adherence to adjuvant endocrine therapy.  F. Evidence suggests that active lifestyle and achieving and maintaining an ideal body weight (20-25 BMI) may lead to optimal breast cancer outcomes.    All questions were answered. The patient knows to call the clinic with any problems, questions or concerns. We can certainly see the patient much sooner if necessary.  This document was electronically signed. Molli Hazard, MD

## 2015-03-20 ENCOUNTER — Encounter (HOSPITAL_COMMUNITY): Payer: Medicare Other | Attending: Hematology & Oncology | Admitting: Hematology & Oncology

## 2015-03-20 ENCOUNTER — Other Ambulatory Visit (HOSPITAL_COMMUNITY): Payer: Self-pay | Admitting: Hematology & Oncology

## 2015-03-20 ENCOUNTER — Encounter (HOSPITAL_COMMUNITY): Payer: Self-pay | Admitting: Hematology & Oncology

## 2015-03-20 VITALS — BP 138/57 | HR 101 | Temp 98.5°F | Resp 20 | Wt 235.0 lb

## 2015-03-20 DIAGNOSIS — C50912 Malignant neoplasm of unspecified site of left female breast: Secondary | ICD-10-CM | POA: Diagnosis not present

## 2015-03-20 DIAGNOSIS — R7989 Other specified abnormal findings of blood chemistry: Secondary | ICD-10-CM | POA: Diagnosis not present

## 2015-03-20 DIAGNOSIS — Z170 Estrogen receptor positive status [ER+]: Secondary | ICD-10-CM | POA: Diagnosis not present

## 2015-03-20 DIAGNOSIS — I890 Lymphedema, not elsewhere classified: Secondary | ICD-10-CM | POA: Diagnosis not present

## 2015-03-20 DIAGNOSIS — Z9889 Other specified postprocedural states: Secondary | ICD-10-CM

## 2015-03-20 NOTE — Patient Instructions (Addendum)
[place] at [facility] Discharge Instructions  RECOMMENDATIONS MADE BY THE CONSULTANT AND ANY TEST RESULTS WILL BE SENT TO YOUR REFERRING PHYSICIAN.   Exam completed by Dr Whitney Muse today Mammogram scheduled in march 2017 Breast exam with the doctor next visit Any new lumps or bumps or any new problems please call us. Return to see the doctor in 6 months. Please call the clinic if you have any questions or concerns   Thank you for choosing [place] at [facility] to provide your oncology and hematology care.  To afford each patient quality time with our provider, please arrive at least 15 minutes before your scheduled appointment time.    You need to re-schedule your appointment should you arrive 10 or more minutes late.  We strive to give you quality time with our providers, and arriving late affects you and other patients whose appointments are after yours.  Also, if you no show three or more times for appointments you may be dismissed from the clinic at the providers discretion.     Again, thank you for choosing [facility].  Our hope is that these requests will decrease the amount of time that you wait before being seen by our physicians.       _____________________________________________________________  Should you have questions after your visit to [facility], please contact our office at [phone] between the hours of 8:30 a.m. and 4:30 p.m.  Voicemails left after 4:30 p.m. will not be returned until the following business day.  For prescription refill requests, have your pharmacy contact our office.

## 2015-04-01 ENCOUNTER — Other Ambulatory Visit: Payer: Self-pay | Admitting: Family Medicine

## 2015-04-18 ENCOUNTER — Encounter (HOSPITAL_COMMUNITY): Payer: Self-pay | Admitting: *Deleted

## 2015-04-18 ENCOUNTER — Emergency Department (INDEPENDENT_AMBULATORY_CARE_PROVIDER_SITE_OTHER)
Admission: EM | Admit: 2015-04-18 | Discharge: 2015-04-18 | Disposition: A | Discharge status: Home / Self Care | Payer: Medicare Other | Source: Home / Self Care | Attending: Family Medicine | Admitting: Family Medicine

## 2015-04-18 DIAGNOSIS — J0101 Acute recurrent maxillary sinusitis: Secondary | ICD-10-CM | POA: Diagnosis not present

## 2015-04-18 MED ORDER — AZITHROMYCIN 250 MG PO TABS: ORAL_TABLET | ORAL | Status: DC

## 2015-04-18 NOTE — ED Notes (Signed)
Pt  Reports  Symptoms  Of  Cough  /  Congestion        With  Sinus  Drainage      Symptoms  Started  sev  Weeks  Ago worse  Over the  Last  3  Days    Pt  Reports  The  Symptoms  Not  releived  By otc meds

## 2015-04-18 NOTE — ED Provider Notes (Signed)
CSN: [id number]     Arrival date & time 04/18/15  1313 History   First MD Initiated Contact with Patient 04/18/15 1442     Chief Complaint  Patient presents with  . URI   (Consider location/radiation/quality/duration/timing/severity/associated sxs/prior Treatment) Patient is a 74 y.o. female presenting with URI. The history is provided by the patient and the spouse.  URI Presenting symptoms: congestion, cough, ear pain, facial pain and rhinorrhea   Presenting symptoms: no fever   Severity:  Mild Onset quality:  Gradual Duration:  2 weeks Progression:  Worsening Chronicity:  New Relieved by:  None tried Worsened by:  Nothing tried Ineffective treatments:  None tried Risk factors: chronic respiratory disease     Past Medical History  Diagnosis Date  . Hypertension   . Hyperlipidemia   . Diabetes mellitus   . Asthma     spirometry (2011)- mild ventilary defect  . Cancer ([place]) 2012    Breast  . Arthritis     Osteoarthritis  . Fatty liver 2013    enlarged  . Sliding hiatal hernia   . Ventral hernia, unspecified, without mention of obstruction or gangrene   . Lung nodule seen on imaging study 2013  . HSV (herpes simplex virus) infection   . Lymphedema     Left arm  . 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 Calcification of left breast   . Arthritis   . Shortness of breath     with exertion  . Sleep apnea     Stop Bang score of 5. Pt has had sleep study, but was shown to be negative for sleep apnea.  Marland Kitchen Dysrhythmia     palpatations   Past Surgical History  Procedure Laterality Date  . Cesarean section    . 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 Breast surgery  jan 2013  . Hand ligament reconstruction Right   . Cataract extraction, bilateral      on different occassions   . Shoulder arthroscopy with open rotator cuff repair Left 07/26/2012  . Breast biopsy  06/22/13  . Breast biopsy Left 09/12/2013    Procedure: BREAST  BIOPSY WITH NEEDLE LOCALIZATION;  Surgeon: Jamesetta So, MD;  Location: [facility];  Service: General;  Laterality: Left;  . Hysteroscopy w/d&c N/A 11/06/2013    Procedure: DILATATION AND CURETTAGE /HYSTEROSCOPY;  Surgeon: Jonnie Kind, MD;  Location: [facility];  Service: Gynecology;  Laterality: N/A;  . Polypectomy N/A 11/06/2013    Procedure: POLYPECTOMY (REMOVAL ENDOMETRIAL POLYP);  Surgeon: Jonnie Kind, MD;  Location: [facility];  Service: Gynecology;  Laterality: N/A;  . Rectocele repair N/A 11/06/2013    Procedure: POSTERIOR REPAIR (RECTOCELE);  Surgeon: Jonnie Kind, MD;  Location: [facility];  Service: Gynecology;  Laterality: N/A;   Family History  Problem Relation Age of Onset  . Hypertension Mother   . Heart disease Mother   . Diabetes Mother   . Kidney disease Mother     ESRD  . Cancer Mother     Bladder  . Hypertension Father   . Hyperlipidemia Father   . Heart disease Father   . Diabetes Father   . Cancer Father     Stomach Cancer  . Diabetes Sister   . Heart disease Sister   . Cancer Maternal Uncle     prostate  . Alzheimer's disease Paternal Aunt   . Cancer Paternal Uncle     stomach  . Stroke  Maternal Grandfather   . Cancer Paternal Grandfather     stomach   Social History  Substance Use Topics  . Smoking status: Passive Smoke Exposure - Never Smoker  . Smokeless tobacco: Never Used     Comment: husband smokes, but pt doesn't   . Alcohol Use: 0.0 oz/week    0 Standard drinks or equivalent per week     Comment: occasional drink twice a year   OB History    Gravida Para Term Preterm AB TAB SAB Ectopic Multiple Living   '2 2        2     '$ Review of Systems  Constitutional: Negative.  Negative for fever.  HENT: Positive for congestion, ear pain, postnasal drip and rhinorrhea. Negative for facial swelling.   Respiratory: Positive for cough.   All other systems reviewed and are negative.   Allergies  Arimidex; Adhesive; Lisinopril; and Tetracyclines &  related  Home Medications   Prior to Admission medications   Medication Sig Start Date End Date Taking? Authorizing Provider  acetaminophen (TYLENOL) 500 MG tablet Take 500 mg by mouth as needed for moderate pain. Takes 2 when needed    Historical Provider, MD  acyclovir (ZOVIRAX) 200 MG capsule Take 200 mg by mouth daily as needed (herpes).    Historical Provider, MD  albuterol (PROVENTIL HFA;VENTOLIN HFA) 108 (90 BASE) MCG/ACT inhaler Inhale 2 puffs into the lungs every 4 (four) hours as needed. 08/14/14   Alycia Rossetti, MD  Aloe Vera Leaf POWD Apply 800 mg topically 2 (two) times daily.     Historical Provider, MD  Ascorbic Acid (VITAMIN C) 1000 MG tablet Take 1,000 mg by mouth 2 (two) times daily.    Historical Provider, MD  azithromycin (ZITHROMAX Z-PAK) 250 MG tablet Take as directed on pack 04/18/15   Billy Fischer, MD  b complex vitamins tablet Take 1 tablet by mouth 2 (two) times daily.     Historical Provider, MD  Cholecalciferol (VITAMIN D3) 2000 UNITS TABS Take 2,000 Units by mouth 2 (two) times daily.    Historical Provider, MD  Docusate Calcium (STOOL SOFTENER PO) Take 1 capsule by mouth as needed (constipation).     Historical Provider, MD  DULERA 100-5 MCG/ACT AERO INHALE 2 PUFFS BY MOUTH TWICE DAILY 02/17/15   Alycia Rossetti, MD  Echinacea 380 MG CAPS Take 1 capsule by mouth as needed (cold/flue).    Historical Provider, MD  fluocinonide cream (LIDEX) 5.64 % Apply 1 application topically 2 (two) times daily as needed (poison ivy/bites/hives). 11/08/14   Alycia Rossetti, MD  Ginkgo Biloba 120 MG CAPS Take 1 capsule by mouth daily.     Historical Provider, MD  glipiZIDE (GLUCOTROL) 10 MG tablet TAKE 1 TABLET BY MOUTH TWICE DAILY BEFORE A MEAL 04/01/15   Alycia Rossetti, MD  guaiFENesin-codeine 100-10 MG/5ML syrup Take 5 mLs by mouth every 6 (six) hours as needed for cough. Patient not taking: Reported on 03/20/2015 08/14/14   Alycia Rossetti, MD  HYDROcodone-acetaminophen  (NORCO/VICODIN) 5-325 MG per tablet Take 1 tablet by mouth every 4 (four) hours as needed. 07/06/14   Lily Kocher, PA-C  ibuprofen (ADVIL,MOTRIN) 800 MG tablet Take 800 mg by mouth daily as needed for moderate pain.    Historical Provider, MD  JANUVIA 50 MG tablet TAKE 1 TABLET BY MOUTH DAILY 09/12/14   Alycia Rossetti, MD  ketoconazole (NIZORAL) 2 % cream Apply 1 application topically 2 (two) times daily as needed. 08/02/14  Historical Provider, MD  KRILL OIL PO Take 1 capsule by mouth 2 (two) times daily.    Historical Provider, MD  Magnesium 400 MG TABS Take 400 mg by mouth daily.    Historical Provider, MD  Melatonin 3 MG TABS Take 3 mg by mouth at bedtime as needed and may repeat dose one time if needed (sleep).     Historical Provider, MD  metFORMIN (GLUCOPHAGE) 1000 MG tablet TAKE 1 TABLET BY MOUTH TWICE DAILY WITH A MEAL 03/17/15   Alycia Rossetti, MD  montelukast (SINGULAIR) 10 MG tablet TAKE 1 TABLET BY MOUTH EVERY NIGHT AT BEDTIME 09/12/14   Alycia Rossetti, MD  Multiple Vitamin (MULTIVITAMIN WITH MINERALS) TABS tablet Take 1 tablet by mouth daily.    Historical Provider, MD  Naftifine HCl 1 % GEL Apply 1 application topically daily as needed.    Historical Provider, MD  naproxen (NAPROSYN) 500 MG tablet Take 1 tablet (500 mg total) by mouth 2 (two) times daily as needed. Patient not taking: Reported on 03/20/2015 10/11/11   Alycia Rossetti, MD  NONFORMULARY OR COMPOUNDED ITEM Apply 1 application topically 2 (two) times daily as needed. Ketoconazole & fluticasone cream 1:1    Historical Provider, MD  simethicone (MYLICON) 80 MG chewable tablet Chew 80 mg by mouth as needed for flatulence.     Historical Provider, MD  tamoxifen (NOLVADEX) 20 MG tablet Take 1 tablet (20 mg total) by mouth at bedtime. 02/10/15   Baird Cancer, PA-C  tiZANidine (ZANAFLEX) 4 MG tablet TAKE 1 TABLET BY MOUTH EVERY NIGHT AT BEDTIME AS NEEDED FOR MUSCLE SPASMS 08/26/14   Alycia Rossetti, MD   triamterene-hydrochlorothiazide (MAXZIDE) 75-50 MG tablet TAKE 1 TABLET BY MOUTH EVERY DAY 04/01/15   Alycia Rossetti, MD   Meds Ordered and Administered this Visit  Medications - No data to display  BP 132/67 mmHg  Pulse 86  Temp(Src) 98.1 F (36.7 C) (Oral)  Resp 16  SpO2 97% No data found.   Physical Exam  Constitutional: She is oriented to person, place, and time. She appears well-developed and well-nourished. No distress.  HENT:  Right Ear: External ear normal.  Left Ear: External ear normal.  Nose: Mucosal edema and rhinorrhea present.  Mouth/Throat: Oropharynx is clear and moist.  Eyes: Conjunctivae are normal. Pupils are equal, round, and reactive to light.  Neck: Normal range of motion. Neck supple.  Cardiovascular: Normal rate, regular rhythm, normal heart sounds and intact distal pulses.   Pulmonary/Chest: Breath sounds normal.  Lymphadenopathy:    She has no cervical adenopathy.  Neurological: She is alert and oriented to person, place, and time.  Skin: Skin is warm and dry.  Nursing note and vitals reviewed.   ED Course  Procedures (including critical care time)  Labs Review Labs Reviewed - No data to display  Imaging Review No results found.   Visual Acuity Review  Right Eye Distance:   Left Eye Distance:   Bilateral Distance:    Right Eye Near:   Left Eye Near:    Bilateral Near:         MDM   1. Acute recurrent maxillary sinusitis        Billy Fischer, MD 04/18/15 [phone]

## 2015-05-05 ENCOUNTER — Encounter (INDEPENDENT_AMBULATORY_CARE_PROVIDER_SITE_OTHER): Payer: Self-pay | Admitting: *Deleted

## 2015-05-13 ENCOUNTER — Other Ambulatory Visit: Payer: Self-pay | Admitting: Family Medicine

## 2015-05-13 NOTE — Telephone Encounter (Signed)
Refill appropriate and filled per protocol. 

## 2015-05-13 NOTE — Telephone Encounter (Signed)
okay

## 2015-05-13 NOTE — Telephone Encounter (Signed)
Ok to refill 

## 2015-05-19 ENCOUNTER — Other Ambulatory Visit: Payer: Self-pay | Admitting: *Deleted

## 2015-05-19 ENCOUNTER — Other Ambulatory Visit: Payer: Self-pay | Admitting: Family Medicine

## 2015-05-19 DIAGNOSIS — I10 Essential (primary) hypertension: Secondary | ICD-10-CM | POA: Diagnosis not present

## 2015-05-19 DIAGNOSIS — N182 Chronic kidney disease, stage 2 (mild): Secondary | ICD-10-CM

## 2015-05-19 DIAGNOSIS — E119 Type 2 diabetes mellitus without complications: Secondary | ICD-10-CM

## 2015-05-19 DIAGNOSIS — E785 Hyperlipidemia, unspecified: Secondary | ICD-10-CM

## 2015-05-19 LAB — CBC WITH DIFFERENTIAL/PLATELET
BASOS PCT: 0 % (ref 0–1)
Basophils Absolute: 0 10*3/uL (ref 0.0–0.1)
EOS ABS: 0.2 10*3/uL (ref 0.0–0.7)
EOS PCT: 3 % (ref 0–5)
HCT: 39.8 % (ref 36.0–46.0)
HEMOGLOBIN: 12.9 g/dL (ref 12.0–15.0)
Lymphocytes Relative: 27 % (ref 12–46)
Lymphs Abs: 2.1 10*3/uL (ref 0.7–4.0)
MCH: 27.2 pg (ref 26.0–34.0)
MCHC: 32.4 g/dL (ref 30.0–36.0)
MCV: 83.8 fL (ref 78.0–100.0)
MONO ABS: 0.6 10*3/uL (ref 0.1–1.0)
MONOS PCT: 8 % (ref 3–12)
MPV: 10.2 fL (ref 8.6–12.4)
NEUTROS ABS: 4.9 10*3/uL (ref 1.7–7.7)
Neutrophils Relative %: 62 % (ref 43–77)
PLATELETS: 242 10*3/uL (ref 150–400)
RBC: 4.75 MIL/uL (ref 3.87–5.11)
RDW: 14.2 % (ref 11.5–15.5)
WBC: 7.9 10*3/uL (ref 4.0–10.5)

## 2015-05-19 LAB — LIPID PANEL
Cholesterol: 214 mg/dL — ABNORMAL HIGH (ref 125–200)
HDL: 46 mg/dL (ref >=46)
LDL Cholesterol: 131 mg/dL — ABNORMAL HIGH (ref <130)
TRIGLYCERIDES: 185 mg/dL — AB (ref <150)
Total CHOL/HDL Ratio: 4.7 Ratio (ref <=5.0)
VLDL: 37 mg/dL — ABNORMAL HIGH (ref <30)

## 2015-05-19 LAB — COMPLETE METABOLIC PANEL WITH GFR
ALBUMIN: 3.7 g/dL (ref 3.6–5.1)
ALK PHOS: 40 U/L (ref 33–130)
ALT: 44 U/L — ABNORMAL HIGH (ref 6–29)
AST: 43 U/L — AB (ref 10–35)
BILIRUBIN TOTAL: 0.4 mg/dL (ref 0.2–1.2)
BUN: 22 mg/dL (ref 7–25)
CALCIUM: 9.5 mg/dL (ref 8.6–10.4)
CO2: 27 mmol/L (ref 20–31)
Chloride: 103 mmol/L (ref 98–110)
Creat: 0.93 mg/dL (ref 0.60–0.93)
GFR, Est African American: 71 mL/min (ref >=60)
GFR, Est Non African American: 62 mL/min (ref >=60)
Glucose, Bld: 129 mg/dL — ABNORMAL HIGH (ref 70–99)
POTASSIUM: 4 mmol/L (ref 3.5–5.3)
Sodium: 140 mmol/L (ref 135–146)
TOTAL PROTEIN: 6.4 g/dL (ref 6.1–8.1)

## 2015-05-19 LAB — HEMOGLOBIN A1C
HEMOGLOBIN A1C: 7.3 % — AB (ref <5.7)
Mean Plasma Glucose: 163 mg/dL — ABNORMAL HIGH (ref <117)

## 2015-05-23 ENCOUNTER — Ambulatory Visit (INDEPENDENT_AMBULATORY_CARE_PROVIDER_SITE_OTHER): Payer: Medicare Other | Admitting: Family Medicine

## 2015-05-23 ENCOUNTER — Encounter: Payer: Self-pay | Admitting: Family Medicine

## 2015-05-23 ENCOUNTER — Telehealth: Payer: Self-pay | Admitting: *Deleted

## 2015-05-23 VITALS — BP 130/74 | HR 78 | Temp 98.7°F | Resp 16 | Ht 65.0 in | Wt 236.0 lb

## 2015-05-23 DIAGNOSIS — R011 Cardiac murmur, unspecified: Secondary | ICD-10-CM | POA: Diagnosis not present

## 2015-05-23 DIAGNOSIS — R252 Cramp and spasm: Secondary | ICD-10-CM | POA: Diagnosis not present

## 2015-05-23 DIAGNOSIS — I890 Lymphedema, not elsewhere classified: Secondary | ICD-10-CM | POA: Diagnosis not present

## 2015-05-23 DIAGNOSIS — E1143 Type 2 diabetes mellitus with diabetic autonomic (poly)neuropathy: Secondary | ICD-10-CM

## 2015-05-23 DIAGNOSIS — K760 Fatty (change of) liver, not elsewhere classified: Secondary | ICD-10-CM

## 2015-05-23 DIAGNOSIS — E785 Hyperlipidemia, unspecified: Secondary | ICD-10-CM | POA: Diagnosis not present

## 2015-05-23 DIAGNOSIS — I10 Essential (primary) hypertension: Secondary | ICD-10-CM

## 2015-05-23 DIAGNOSIS — M19042 Primary osteoarthritis, left hand: Secondary | ICD-10-CM | POA: Diagnosis not present

## 2015-05-23 DIAGNOSIS — M1812 Unilateral primary osteoarthritis of first carpometacarpal joint, left hand: Secondary | ICD-10-CM

## 2015-05-23 MED ORDER — ROSUVASTATIN CALCIUM 5 MG PO TABS: 5.0000 mg | ORAL_TABLET | Freq: Every day | ORAL | Status: DC

## 2015-05-23 NOTE — Patient Instructions (Addendum)
F/U 4 months for Physical  2D Echo to be done  Referral to Ortho  Sleeve to be rechecked

## 2015-05-23 NOTE — Progress Notes (Signed)
Patient ID: Julie Clay, female   DOB: 11-05-1940, 75 y.o.   MRN: [id number]    Subjective:    Patient ID: Julie Clay, female    DOB: 05-20-1940, 75 y.o.   MRN: [id number]  Patient presents for 3 month F/U  is here to follow-up on medical problems diabetes mellitus hypertension hyperlipidemia. Her fasting labs reviewed. After we stopped her simvastatin because of leg cramps this did resolve the cramps. Her cholesterol has gone up of course that she is off cholesterol medication. Her LDLs at 131 her A1c 7.3%.   Since her last visit she was also seen at the urgent care secondary to upper respiratory infection.   AS A FEW OTHER CONCERNS. sHE HAS ARTHRITIS AND PAIN IN HER THUMB SHE IS r eDDIE [facility] WAS SEEN AND HER HUSBAND THEY STATE THEY WILL INJECT HER THUMB NEXT WEEK SHE NEEDS A REFERRAL   She's having problems with her lymphedema sleeve it is bunching up right above the elbow and does not seem to fit correctly.   she was seen by oncology recently heart heart murmur she would like to have this reevaluated  Review Of Systems:  GEN- denies fatigue, fever, weight loss,weakness, recent illness HEENT- denies eye drainage, change in vision, nasal discharge, CVS- denies chest pain, palpitations RESP- denies SOB, cough, wheeze ABD- denies N/V, change in stools, abd pain GU- denies dysuria, hematuria, dribbling, incontinence MSK- +joint pain, muscle aches, injury Neuro- denies headache, dizziness, syncope, seizure activity       Objective:    BP 130/74 mmHg  Pulse 78  Temp(Src) 98.7 F (37.1 C) (Oral)  Resp 16  Ht '5\' 5"'$  (1.651 m)  Wt 236 lb (107.049 kg)  BMI 39.27 kg/m2 GEN- NAD, alert and oriented x3 HEENT- PERRL, EOMI, non injected sclera, pink conjunctiva, MMM, oropharynx clear CVS- RRR, SEM radiates to carotid, no gallop RESP-CTAB ABD-NABS,soft,NT,ND EXT- pedal edema Pulses- Radial, DP- 2+       Assessment & Plan:      Problem List Items Addressed  This Visit    Lymphedema    Her sleeve does not fit well, will contact company to have this reassessed      Leg cramps    Improved off Zocor      Hyperlipidemia     Trial of crestor '5mg'$        Relevant Medications   rosuvastatin (CRESTOR) 5 MG tablet   Fatty liver disease, nonalcoholic   Essential hypertension, benign    Well controlled but with heart murmur noted noted on previous exams, ? sclersosis of valve She is not symptomatic,will obtain 2D Echo      Relevant Medications   rosuvastatin (CRESTOR) 5 MG tablet   Other Relevant Orders   ECHOCARDIOGRAM COMPLETE   DM (diabetes mellitus) ([place]) - Primary     Diabetes she has good control her goal is less than 7.5% because of her age currently at 7.3% no change in current medications      Relevant Medications   rosuvastatin (CRESTOR) 5 MG tablet   Other Relevant Orders   ECHOCARDIOGRAM COMPLETE    Other Visit Diagnoses    Heart murmur        ?Aortic sclerosis- obtain 2D Echo, she is asymptomatic    Relevant Orders    ECHOCARDIOGRAM COMPLETE    Osteoarthritis of thumb, left        Relevant Orders    Ambulatory referral to Orthopedic Surgery       Note: This  dictation was prepared with Dragon dictation along with smaller phrase technology. Any transcriptional errors that result from this process are unintentional.

## 2015-05-23 NOTE — Assessment & Plan Note (Addendum)
Trial of crestor '5mg'$ 

## 2015-05-23 NOTE — Telephone Encounter (Signed)
Patient seen in office for check-up.  Reports that custom fit lymphedema sleeve is not fitting correctly and is causing discomfort/ pain when sleeve bunches up in elbow.   MD recommends having patient re-fit for sleeve.   Call placed to [employer] [phone]) who supplied sleeve.   Was advised to contact Rexford Maus with [facility] since she measured patient [phone] mobile/ [phone] ph/ [phone] fax.  Call placed to [facility]. [facility] operates M-Th 9am- 4pm.   LMTRC.

## 2015-05-23 NOTE — Assessment & Plan Note (Signed)
Diabetes she has good control her goal is less than 7.5% because of her age currently at 7.3% no change in current medications

## 2015-05-23 NOTE — Assessment & Plan Note (Signed)
Her sleeve does not fit well, will contact company to have this reassessed

## 2015-05-23 NOTE — Assessment & Plan Note (Signed)
Improved off Zocor

## 2015-05-23 NOTE — Assessment & Plan Note (Signed)
Well controlled but with heart murmur noted noted on previous exams, ? sclersosis of valve She is not symptomatic,will obtain 2D Echo

## 2015-05-26 NOTE — Telephone Encounter (Signed)
Received call from [facility] with Julie Clay.   Reports that generally if fit is an issue, there is a 10 day grace period after service date that manufacturers will refit sleeve. Reports that since waiting period has passed, she will contact Hoyle Sauer to see what can be done, and will contact [facility] with recommendations.   MD to be made aware.

## 2015-05-26 NOTE — Telephone Encounter (Signed)
Okay, follow up with Hoyle Sauer

## 2015-05-27 NOTE — Telephone Encounter (Signed)
Patient aware.

## 2015-05-30 DIAGNOSIS — M1812 Unilateral primary osteoarthritis of first carpometacarpal joint, left hand: Secondary | ICD-10-CM | POA: Diagnosis not present

## 2015-06-04 ENCOUNTER — Ambulatory Visit (HOSPITAL_COMMUNITY)
Admission: RE | Admit: 2015-06-04 | Discharge: 2015-06-04 | Disposition: A | Discharge status: Home / Self Care | Payer: Medicare Other | Source: Ambulatory Visit | Attending: Family Medicine | Admitting: Family Medicine

## 2015-06-04 DIAGNOSIS — E1143 Type 2 diabetes mellitus with diabetic autonomic (poly)neuropathy: Secondary | ICD-10-CM

## 2015-06-04 DIAGNOSIS — Z6839 Body mass index (BMI) 39.0-39.9, adult: Secondary | ICD-10-CM | POA: Insufficient documentation

## 2015-06-04 DIAGNOSIS — G4733 Obstructive sleep apnea (adult) (pediatric): Secondary | ICD-10-CM | POA: Insufficient documentation

## 2015-06-04 DIAGNOSIS — R011 Cardiac murmur, unspecified: Secondary | ICD-10-CM | POA: Diagnosis not present

## 2015-06-04 DIAGNOSIS — I071 Rheumatic tricuspid insufficiency: Secondary | ICD-10-CM | POA: Diagnosis not present

## 2015-06-04 DIAGNOSIS — I10 Essential (primary) hypertension: Secondary | ICD-10-CM | POA: Insufficient documentation

## 2015-06-04 DIAGNOSIS — E119 Type 2 diabetes mellitus without complications: Secondary | ICD-10-CM | POA: Diagnosis not present

## 2015-06-09 ENCOUNTER — Ambulatory Visit (INDEPENDENT_AMBULATORY_CARE_PROVIDER_SITE_OTHER): Payer: Medicare Other | Admitting: Internal Medicine

## 2015-06-09 DIAGNOSIS — E113293 Type 2 diabetes mellitus with mild nonproliferative diabetic retinopathy without macular edema, bilateral: Secondary | ICD-10-CM | POA: Diagnosis not present

## 2015-06-09 DIAGNOSIS — Z961 Presence of intraocular lens: Secondary | ICD-10-CM | POA: Diagnosis not present

## 2015-06-09 DIAGNOSIS — Z9849 Cataract extraction status, unspecified eye: Secondary | ICD-10-CM | POA: Diagnosis not present

## 2015-06-09 DIAGNOSIS — H43813 Vitreous degeneration, bilateral: Secondary | ICD-10-CM | POA: Diagnosis not present

## 2015-06-10 ENCOUNTER — Ambulatory Visit (INDEPENDENT_AMBULATORY_CARE_PROVIDER_SITE_OTHER): Payer: Medicare Other | Admitting: Internal Medicine

## 2015-06-10 ENCOUNTER — Encounter (INDEPENDENT_AMBULATORY_CARE_PROVIDER_SITE_OTHER): Payer: Self-pay | Admitting: Internal Medicine

## 2015-06-10 VITALS — BP 140/60 | HR 72 | Temp 98.5°F | Ht 65.0 in | Wt 234.0 lb

## 2015-06-10 DIAGNOSIS — K760 Fatty (change of) liver, not elsewhere classified: Secondary | ICD-10-CM

## 2015-06-10 NOTE — Patient Instructions (Signed)
OV in 6 months. Labs in 3 months.

## 2015-06-10 NOTE — Progress Notes (Signed)
Subjective:    Patient ID: Julie Clay, female    DOB: Dec 10, 1940, 75 y.o.   MRN: [id number]  HPIHere today for f/u of her elevated liver enzymes. She was last seen in August of 2016.  Hepatitis markers were negative. Auto immune process ruled out.  Felt due to fatty liver.  Last liver enzymes were slightly elevated. She tries to walk but it is not easy. She has heel spurs and knee spurs. Appetite is good. She has lost 5 pounds since her last visit in August which was intentional. No abdominal pain.  She has a BM 1-2 times a day.  Her liver enzymes were normal in 2014. Denies prior hx of jaundice.     Diabetic x 10 yrs.   Hepatic Function Panel     Component Value Date/Time   PROT 6.4 05/19/2015 0938   ALBUMIN 3.7 05/19/2015 0938   AST 43* 05/19/2015 0938   ALT 44* 05/19/2015 0938   ALKPHOS 40 05/19/2015 0938   BILITOT 0.4 05/19/2015 0938   BILIDIR <0.1 12/04/2014 1419   IBILI NOT CALC 12/04/2014 1419       HPI Referred to our office for elevated liver enzymes and fatty liver. Liver enzymes normal in 2014. She denies prior hx of jaundice. She denies IV drug use. No tattoos. No ear piercing.  Her appetite is good. . She is trying to lose weight. She has lost 10 pounds in the past 6 months. She says she has trouble exercising. She has pain in her knee. She denies abdominal pain.  She usually has a BM daily. BS+ usually very low or very high. Has been a diabetic for over 10 yrs.  In April her HA1C 7.1.      11/14/2014 US abdomen: Elevated liver function test, hx of breast cancer.  IMPRESSION: 1. Echogenic inhomogeneous liver parenchyma consistent with fatty infiltration. No focal hepatic abnormality is seen. 2. No gallstones. 3. Hepatomegaly. 4. Some of the anatomy particularly the pancreas and abdominal aorta are obscured by bowel gas and body habitus.      Review of Systems Past Medical History  Diagnosis Date  . Hypertension   . Hyperlipidemia     . Diabetes mellitus   . Asthma     spirometry (2011)- mild ventilary defect  . Cancer ([place]) 2012    Breast  . Arthritis     Osteoarthritis  . Fatty liver 2013    enlarged  . Sliding hiatal hernia   . Ventral hernia, unspecified, without mention of obstruction or gangrene   . Lung nodule seen on imaging study 2013  . HSV (herpes simplex virus) infection   . Lymphedema     Left arm  . 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 Calcification of left breast   . Arthritis   . Shortness of breath     with exertion  . Sleep apnea     Stop Bang score of 5. Pt has had sleep study, but was shown to be negative for sleep apnea.  Marland Kitchen Dysrhythmia     palpatations    Past Surgical History  Procedure Laterality Date  . Cesarean section    . 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 Breast surgery  jan 2013  . Hand ligament reconstruction Right   . Cataract extraction, bilateral      on different occassions   . Shoulder arthroscopy with open rotator cuff repair Left 07/26/2012  . Breast  biopsy  06/22/13  . Breast biopsy Left 09/12/2013    Procedure: BREAST BIOPSY WITH NEEDLE LOCALIZATION;  Surgeon: Jamesetta So, MD;  Location: [facility];  Service: General;  Laterality: Left;  . Hysteroscopy w/d&c N/A 11/06/2013    Procedure: DILATATION AND CURETTAGE /HYSTEROSCOPY;  Surgeon: Jonnie Kind, MD;  Location: [facility];  Service: Gynecology;  Laterality: N/A;  . Polypectomy N/A 11/06/2013    Procedure: POLYPECTOMY (REMOVAL ENDOMETRIAL POLYP);  Surgeon: Jonnie Kind, MD;  Location: [facility];  Service: Gynecology;  Laterality: N/A;  . Rectocele repair N/A 11/06/2013    Procedure: POSTERIOR REPAIR (RECTOCELE);  Surgeon: Jonnie Kind, MD;  Location: [facility];  Service: Gynecology;  Laterality: N/A;    Allergies  Allergen Reactions  . Arimidex [Anastrozole] Other (See Comments)    Arthralgias and myalgias.  Improved with 3 week hiatus from drug.  Categorical side  effect of drug class.  . Adhesive [Tape] Other (See Comments)    "red and blistered"  . Lisinopril     Dry hacking cough and tickle  . Tetracyclines & Related Other (See Comments)    unknown    Current Outpatient Prescriptions on File Prior to Visit  Medication Sig Dispense Refill  . acetaminophen (TYLENOL) 500 MG tablet Take 500 mg by mouth as needed for moderate pain. Takes 2 when needed    . acyclovir (ZOVIRAX) 200 MG capsule Take 200 mg by mouth daily as needed (herpes).    Marland Kitchen albuterol (PROVENTIL HFA;VENTOLIN HFA) 108 (90 BASE) MCG/ACT inhaler Inhale 2 puffs into the lungs every 4 (four) hours as needed. 6.7 g 6  . Aloe Vera Leaf POWD Apply 800 mg topically 2 (two) times daily.     . Ascorbic Acid (VITAMIN C) 1000 MG tablet Take 1,000 mg by mouth 2 (two) times daily.    Marland Kitchen b complex vitamins tablet Take 1 tablet by mouth 2 (two) times daily.     . Cholecalciferol (VITAMIN D3) 2000 UNITS TABS Take 2,000 Units by mouth 2 (two) times daily.    . DULERA 100-5 MCG/ACT AERO INHALE 2 PUFFS BY MOUTH TWICE DAILY 13 g 11  . Echinacea 380 MG CAPS Take 1 capsule by mouth as needed (cold/flue).    . fluocinonide cream (LIDEX) 9.37 % Apply 1 application topically 2 (two) times daily as needed (poison ivy/bites/hives). 30 g 11  . Ginkgo Biloba 120 MG CAPS Take 1 capsule by mouth daily.     Marland Kitchen glipiZIDE (GLUCOTROL) 10 MG tablet TAKE 1 TABLET BY MOUTH TWICE DAILY BEFORE A MEAL 180 tablet 0  . ibuprofen (ADVIL,MOTRIN) 800 MG tablet Take 800 mg by mouth daily as needed for moderate pain.    Marland Kitchen JANUVIA 50 MG tablet TAKE 1 TABLET BY MOUTH DAILY 90 tablet 3  . KRILL OIL PO Take 1 capsule by mouth 2 (two) times daily.    . Magnesium 400 MG TABS Take 400 mg by mouth daily.    . Melatonin 3 MG TABS Take 3 mg by mouth at bedtime as needed and may repeat dose one time if needed (sleep).     . metFORMIN (GLUCOPHAGE) 1000 MG tablet TAKE 1 TABLET BY MOUTH TWICE DAILY WITH A MEAL 180 tablet 0  . montelukast  (SINGULAIR) 10 MG tablet TAKE 1 TABLET BY MOUTH EVERY NIGHT AT BEDTIME 90 tablet 3  . Multiple Vitamin (MULTIVITAMIN WITH MINERALS) TABS tablet Take 1 tablet by mouth daily.    . Naftifine HCl 1 % GEL Apply 1  application topically daily as needed.    . naproxen (NAPROSYN) 500 MG tablet Take 1 tablet (500 mg total) by mouth 2 (two) times daily as needed. 180 tablet 1  . rosuvastatin (CRESTOR) 5 MG tablet Take 1 tablet (5 mg total) by mouth daily. 30 tablet 3  . simethicone (MYLICON) 80 MG chewable tablet Chew 80 mg by mouth as needed for flatulence.     . tamoxifen (NOLVADEX) 20 MG tablet Take 1 tablet (20 mg total) by mouth at bedtime. 30 tablet 5  . tiZANidine (ZANAFLEX) 4 MG tablet TAKE 1 TABLET BY MOUTH EVERY NIGHT AT BEDTIME AS NEEDED FOR MUSCLE SPASMS 90 tablet 1  . triamterene-hydrochlorothiazide (MAXZIDE) 75-50 MG tablet TAKE 1 TABLET BY MOUTH EVERY DAY 90 tablet 0  . Docusate Calcium (STOOL SOFTENER PO) Take 1 capsule by mouth as needed (constipation).     . NONFORMULARY OR COMPOUNDED ITEM Apply 1 application topically 2 (two) times daily as needed. Reported on 06/10/2015     No current facility-administered medications on file prior to visit.        Objective:   Physical Exam Blood pressure 140/60, pulse 72, temperature 98.5 F (36.9 C), height '5\' 5"'$  (1.651 m), weight 234 lb (106.142 kg).   Alert and oriented. Skin warm and dry. Oral mucosa is moist.   . Sclera anicteric, conjunctivae is pink. Thyroid not enlarged. No cervical lymphadenopathy. Lungs clear. Heart regular rate and rhythm.  Abdomen is soft. Bowel sounds are positive. No hepatomegaly. No abdominal masses felt. No tenderness.  No edema to lower extremities.            Assessment & Plan:  Elevated liver enzymes. Will repeat in 3 months.  OV in 6 months.

## 2015-06-12 DIAGNOSIS — M1812 Unilateral primary osteoarthritis of first carpometacarpal joint, left hand: Secondary | ICD-10-CM | POA: Insufficient documentation

## 2015-06-24 ENCOUNTER — Ambulatory Visit (HOSPITAL_COMMUNITY): Payer: Medicare Other

## 2015-06-24 ENCOUNTER — Ambulatory Visit (HOSPITAL_COMMUNITY): Admission: RE | Admit: 2015-06-24 | Payer: Medicare Other | Source: Ambulatory Visit

## 2015-06-24 ENCOUNTER — Ambulatory Visit (HOSPITAL_COMMUNITY)
Admission: RE | Admit: 2015-06-24 | Discharge: 2015-06-24 | Disposition: A | Discharge status: Home / Self Care | Payer: Medicare Other | Source: Ambulatory Visit | Attending: Hematology & Oncology | Admitting: Hematology & Oncology

## 2015-06-24 DIAGNOSIS — Z9889 Other specified postprocedural states: Secondary | ICD-10-CM

## 2015-06-24 DIAGNOSIS — R928 Other abnormal and inconclusive findings on diagnostic imaging of breast: Secondary | ICD-10-CM | POA: Diagnosis not present

## 2015-06-28 ENCOUNTER — Other Ambulatory Visit: Payer: Self-pay | Admitting: Family Medicine

## 2015-06-30 NOTE — Telephone Encounter (Signed)
Refill appropriate and filled per protocol. 

## 2015-07-09 ENCOUNTER — Other Ambulatory Visit: Payer: Self-pay | Admitting: *Deleted

## 2015-07-09 MED ORDER — TRIAMTERENE-HCTZ 75-50 MG PO TABS: 1.0000 | ORAL_TABLET | Freq: Every day | ORAL | Status: DC

## 2015-07-09 NOTE — Telephone Encounter (Signed)
Patient in office with spouse for appt.   Requested refill on HTN meds.   Refill appropriate and filled per protocol.

## 2015-07-14 ENCOUNTER — Other Ambulatory Visit: Payer: Self-pay | Admitting: Family Medicine

## 2015-07-14 NOTE — Telephone Encounter (Signed)
Refill appropriate and filled per protocol. 

## 2015-07-18 ENCOUNTER — Telehealth: Payer: Self-pay | Admitting: *Deleted

## 2015-07-18 NOTE — Telephone Encounter (Signed)
Received letter from [facility] that patient dropped off.   Letter states that CMN or letter of medical necessity is required for insurance to pay for equipment, but no equipment is named.   Call placed to patient to inquire. Julie Clay.

## 2015-07-21 NOTE — Telephone Encounter (Signed)
Call placed to patient.   Was advised that CMN is required for Lymphedema sleeve.   MD to be made aware.

## 2015-07-23 ENCOUNTER — Encounter: Payer: Self-pay | Admitting: Family Medicine

## 2015-07-31 ENCOUNTER — Other Ambulatory Visit (HOSPITAL_COMMUNITY): Payer: Self-pay

## 2015-07-31 DIAGNOSIS — C50912 Malignant neoplasm of unspecified site of left female breast: Secondary | ICD-10-CM

## 2015-07-31 MED ORDER — TAMOXIFEN CITRATE 20 MG PO TABS: 20.0000 mg | ORAL_TABLET | Freq: Every day | ORAL | Status: DC

## 2015-08-01 ENCOUNTER — Encounter (HOSPITAL_COMMUNITY): Payer: Self-pay

## 2015-08-08 ENCOUNTER — Telehealth: Payer: Self-pay | Admitting: Family Medicine

## 2015-08-08 MED ORDER — BLOOD GLUCOSE TEST VI STRP
ORAL_STRIP | Status: DC
Start: 2015-08-08 — End: 2016-09-15

## 2015-08-08 NOTE — Telephone Encounter (Signed)
Prescription sent to pharmacy.

## 2015-08-08 NOTE — Telephone Encounter (Signed)
Patient would like one touch ultra test strips called into [facility] in [place] and she would like 150 strips insurance pays better with this amount.  CB# [phone]

## 2015-09-01 ENCOUNTER — Other Ambulatory Visit: Payer: Self-pay | Admitting: Family Medicine

## 2015-09-01 NOTE — Telephone Encounter (Signed)
Refill appropriate and filled per protocol. 

## 2015-09-09 ENCOUNTER — Other Ambulatory Visit: Payer: Self-pay | Admitting: Family Medicine

## 2015-09-11 ENCOUNTER — Other Ambulatory Visit: Payer: Self-pay | Admitting: Family Medicine

## 2015-09-11 DIAGNOSIS — E785 Hyperlipidemia, unspecified: Secondary | ICD-10-CM | POA: Diagnosis not present

## 2015-09-11 DIAGNOSIS — I10 Essential (primary) hypertension: Secondary | ICD-10-CM | POA: Diagnosis not present

## 2015-09-11 DIAGNOSIS — E119 Type 2 diabetes mellitus without complications: Secondary | ICD-10-CM | POA: Diagnosis not present

## 2015-09-11 LAB — LIPID PANEL
CHOL/HDL RATIO: 2.8 ratio (ref <=5.0)
Cholesterol: 131 mg/dL (ref 125–200)
HDL: 46 mg/dL (ref >=46)
LDL Cholesterol: 54 mg/dL (ref <130)
TRIGLYCERIDES: 153 mg/dL — AB (ref <150)
VLDL: 31 mg/dL — ABNORMAL HIGH (ref <30)

## 2015-09-11 LAB — COMPLETE METABOLIC PANEL WITH GFR
ALT: 31 U/L — ABNORMAL HIGH (ref 6–29)
AST: 37 U/L — ABNORMAL HIGH (ref 10–35)
Albumin: 3.5 g/dL — ABNORMAL LOW (ref 3.6–5.1)
Alkaline Phosphatase: 35 U/L (ref 33–130)
BILIRUBIN TOTAL: 0.4 mg/dL (ref 0.2–1.2)
BUN: 24 mg/dL (ref 7–25)
CHLORIDE: 105 mmol/L (ref 98–110)
CO2: 26 mmol/L (ref 20–31)
Calcium: 9.2 mg/dL (ref 8.6–10.4)
Creat: 0.9 mg/dL (ref 0.60–0.93)
GFR, EST AFRICAN AMERICAN: 73 mL/min (ref >=60)
GFR, EST NON AFRICAN AMERICAN: 64 mL/min (ref >=60)
Glucose, Bld: 147 mg/dL — ABNORMAL HIGH (ref 70–99)
Potassium: 3.9 mmol/L (ref 3.5–5.3)
Sodium: 143 mmol/L (ref 135–146)
Total Protein: 6 g/dL — ABNORMAL LOW (ref 6.1–8.1)

## 2015-09-11 LAB — CBC WITH DIFFERENTIAL/PLATELET
BASOS PCT: 0 %
Basophils Absolute: 0 cells/uL (ref 0–200)
EOS ABS: 174 {cells}/uL (ref 15–500)
Eosinophils Relative: 2 %
HCT: 38.4 % (ref 35.0–45.0)
Hemoglobin: 12.5 g/dL (ref 12.0–15.0)
Lymphocytes Relative: 30 %
Lymphs Abs: 2610 cells/uL (ref 850–3900)
MCH: 26.9 pg — ABNORMAL LOW (ref 27.0–33.0)
MCHC: 32.6 g/dL (ref 32.0–36.0)
MCV: 82.8 fL (ref 80.0–100.0)
MONO ABS: 609 {cells}/uL (ref 200–950)
MONOS PCT: 7 %
MPV: 10.4 fL (ref 7.5–12.5)
NEUTROS ABS: 5307 {cells}/uL (ref 1500–7800)
Neutrophils Relative %: 61 %
PLATELETS: 229 10*3/uL (ref 140–400)
RBC: 4.64 MIL/uL (ref 3.80–5.10)
RDW: 14.3 % (ref 11.0–15.0)
WBC: 8.7 10*3/uL (ref 3.8–10.8)

## 2015-09-11 LAB — HEMOGLOBIN A1C
HEMOGLOBIN A1C: 7.5 % — AB (ref <5.7)
Mean Plasma Glucose: 169 mg/dL

## 2015-09-19 ENCOUNTER — Ambulatory Visit (HOSPITAL_COMMUNITY): Payer: Medicare Other | Admitting: Hematology & Oncology

## 2015-09-22 ENCOUNTER — Encounter: Payer: Self-pay | Admitting: Family Medicine

## 2015-09-22 ENCOUNTER — Ambulatory Visit (INDEPENDENT_AMBULATORY_CARE_PROVIDER_SITE_OTHER): Payer: Medicare Other | Admitting: Family Medicine

## 2015-09-22 VITALS — BP 136/68 | HR 74 | Temp 98.7°F | Resp 12 | Ht 65.0 in | Wt 237.0 lb

## 2015-09-22 DIAGNOSIS — I10 Essential (primary) hypertension: Secondary | ICD-10-CM

## 2015-09-22 DIAGNOSIS — E119 Type 2 diabetes mellitus without complications: Secondary | ICD-10-CM

## 2015-09-22 DIAGNOSIS — K760 Fatty (change of) liver, not elsewhere classified: Secondary | ICD-10-CM

## 2015-09-22 DIAGNOSIS — E1141 Type 2 diabetes mellitus with diabetic mononeuropathy: Secondary | ICD-10-CM

## 2015-09-22 DIAGNOSIS — E785 Hyperlipidemia, unspecified: Secondary | ICD-10-CM | POA: Diagnosis not present

## 2015-09-22 DIAGNOSIS — E1143 Type 2 diabetes mellitus with diabetic autonomic (poly)neuropathy: Secondary | ICD-10-CM | POA: Diagnosis not present

## 2015-09-22 DIAGNOSIS — Z0000 Encounter for general adult medical examination without abnormal findings: Secondary | ICD-10-CM

## 2015-09-22 DIAGNOSIS — L84 Corns and callosities: Secondary | ICD-10-CM

## 2015-09-22 NOTE — Progress Notes (Signed)
Patient ID: Julie Clay, female   DOB: August 01, 1940, 75 y.o.   MRN: [id number]  Subjective:   Patient presents for Medicare Annual/Subsequent preventive examination.  Patient here for annual wellness exam. Diabetes mellitus fasting labs reviewed A1c 7.5% previous was 7.3%, LDL at goal 54 She needs a prescription for her diabetic shoes. She continues to have problems with sensitivity issues on her great toe that is now moved over to her first and second digits. She also needs extrawide shoes as now her toes are rubbing.  Medications reviewed oncology note last reviewed. She still has problems with her lymphedema sleeve therefore is when it a few times a week just to keep the swelling down. Fatty liver her liver function tests have improved her cholesterol is also improved  Review Past Medical/Family/Social: per EMR    Risk Factors  Current exercise habits: minimal  Dietary issues discussed: Yes  Cardiac risk factors: Obesity (BMI >= 30 kg/m2). HTN,DM   Depression Screen  (Note: if answer to either of the following is "Yes", a more complete depression screening is indicated)  Over the past two weeks, have you felt down, depressed or hopeless? No Over the past two weeks, have you felt little interest or pleasure in doing things? No Have you lost interest or pleasure in daily life? No Do you often feel hopeless? No Do you cry easily over simple problems? No   Activities of Daily Living  In your present state of health, do you have any difficulty performing the following activities?:  Driving? No  Managing money? No  Feeding yourself? No  Getting from bed to chair? No  Climbing a flight of stairs? yes Preparing food and eating?: No  Bathing or showering? No  Getting dressed: No  Getting to the toilet? No  Using the toilet:No  Moving around from place to place: No  In the past year have you fallen or had a near fall?:No  Are you sexually active? No  Do you have more than one  partner? No   Hearing Difficulties: No  Do you often ask people to speak up or repeat themselves? No  Do you experience ringing or noises in your ears? No Do you have difficulty understanding soft or whispered voices? No  Do you feel that you have a problem with memory? No Do you often misplace items? No  Do you feel safe at home? Yes  Cognitive Testing  Alert? Yes Normal Appearance?Yes  Oriented to person? Yes Place? Yes  Time? Yes  Recall of three objects? Yes  Can perform simple calculations? Yes  Displays appropriate judgment?Yes  Can read the correct time from a watch face?Yes   List the Names of Other Physician/Practitioners you currently use:  Oncology, GI , Ortho   Screening Tests / Date Colonoscopy    Declines                Zostavax  UTD  Mammogram  UTD Influenza Vaccine UTD  Bone Density- Declines   ROS:  GEN- denies fatigue, fever, weight loss,weakness, recent illness HEENT- denies eye drainage, change in vision, nasal discharge, CVS- denies chest pain, palpitations RESP- denies SOB, cough, wheeze ABD- denies N/V, change in stools, abd pain GU- denies dysuria, hematuria, dribbling, incontinence MSK-+ joint pain, muscle aches, injury Neuro- denies headache, dizziness, syncope, seizure activity  Physical: GEN- NAD, alert and oriented x3 HEENT- PERRL, EOMI, non injected sclera, pink conjunctiva, MMM, oropharynx clear Neck- Supple, no thryomegaly CVS- RRR, systolic murmur  RESP-CTAB  ABD-NABS,soft, NT,ND, Large abdomen  EXT- No edema Pulses- Radial, DP- 2+ Neuro- Decreased Monofilament Great and 1st toes bilat, increased senstivity to touch bilat toes  Pre ulcerative callus bilat, forming hammer toe Right foot    Assessment:    Annual wellness medicare exam   Plan:    During the course of the visit the patient was educated and counseled about appropriate screening and preventive services including:   Screen Neg for depression.   Diet review for  nutrition referral? Yes ____ Not Indicated __x__  Patient Instructions (the written plan) was given to the patient.  Medicare Attestation  I have personally reviewed:  The patient's medical and social history  Their use of alcohol, tobacco or illicit drugs  Their current medications and supplements  The patient's functional ability including ADLs,fall risks, home safety risks, cognitive, and hearing and visual impairment  Diet and physical activities  Evidence for depression or mood disorders  The patient's weight, height, BMI, and visual acuity have been recorded in the chart. I have made referrals, counseling, and provided education to the patient based on review of the above and I have provided the patient with a written personalized care plan for preventive services.

## 2015-09-22 NOTE — Patient Instructions (Signed)
Continue current meds Continue to work on diet  F/U 4 months

## 2015-09-22 NOTE — Assessment & Plan Note (Signed)
Blood pressure is well controlled medication medication

## 2015-09-22 NOTE — Assessment & Plan Note (Signed)
Prescription for diabetic shoes given

## 2015-09-22 NOTE — Assessment & Plan Note (Signed)
R function tests are much improved almost at normal now.

## 2015-09-22 NOTE — Assessment & Plan Note (Signed)
Goal is less than 7.5% even though her A1c is up some she is still at her goal no change in medications continue with current cholesterol treatment as well as her blood pressure treatment.

## 2015-09-23 ENCOUNTER — Encounter (HOSPITAL_COMMUNITY): Payer: Medicare Other | Attending: Hematology & Oncology | Admitting: Hematology & Oncology

## 2015-09-23 ENCOUNTER — Encounter (HOSPITAL_COMMUNITY): Payer: Self-pay | Admitting: Hematology & Oncology

## 2015-09-23 ENCOUNTER — Other Ambulatory Visit: Payer: Self-pay | Admitting: Family Medicine

## 2015-09-23 VITALS — BP 150/86 | HR 88 | Temp 98.3°F | Resp 18 | Wt 238.8 lb

## 2015-09-23 DIAGNOSIS — C50912 Malignant neoplasm of unspecified site of left female breast: Secondary | ICD-10-CM | POA: Diagnosis not present

## 2015-09-23 DIAGNOSIS — I890 Lymphedema, not elsewhere classified: Secondary | ICD-10-CM | POA: Diagnosis not present

## 2015-09-23 DIAGNOSIS — Z79811 Long term (current) use of aromatase inhibitors: Secondary | ICD-10-CM

## 2015-09-23 DIAGNOSIS — N6092 Unspecified benign mammary dysplasia of left breast: Secondary | ICD-10-CM

## 2015-09-23 NOTE — Telephone Encounter (Signed)
Refill appropriate and filled per protocol. 

## 2015-09-23 NOTE — Patient Instructions (Addendum)
[place] at [facility] Discharge Instructions  RECOMMENDATIONS MADE BY THE CONSULTANT AND ANY TEST RESULTS WILL BE SENT TO YOUR REFERRING PHYSICIAN.  Exam done and seen today by Dr. Whitney Muse Return to see the doctor in 6 months Please call the clinic if you have any questions or concerns  Thank you for choosing [place] at [facility] to provide your oncology and hematology care.  To afford each patient quality time with our provider, please arrive at least 15 minutes before your scheduled appointment time.   Beginning January 23rd 2017 lab work for the [facility] will be done in the  Main lab at [facility] on 1st floor. If you have a lab appointment with the [place] please come in thru the  Main Entrance and check in at the main information desk  You need to re-schedule your appointment should you arrive 10 or more minutes late.  We strive to give you quality time with our providers, and arriving late affects you and other patients whose appointments are after yours.  Also, if you no show three or more times for appointments you may be dismissed from the clinic at the providers discretion.     Again, thank you for choosing [facility].  Our hope is that these requests will decrease the amount of time that you wait before being seen by our physicians.       _____________________________________________________________  Should you have questions after your visit to [facility], please contact our office at [phone] between the hours of 8:30 a.m. and 4:30 p.m.  Voicemails left after 4:30 p.m. will not be returned until the following business day.  For prescription refill requests, have your pharmacy contact our office.         Resources For Cancer Patients and their Caregivers ? American Cancer Society: Can assist with transportation, wigs, general needs, runs Look Good Feel Better.         (801) 727-8301 ? Cancer Care: Provides financial assistance, online support groups, medication/co-pay assistance.  1-800-813-HOPE 6162182779) ? [facility] Assists [place] cancer patients and their families through emotional , educational and financial support.  7031724129 ? [place] DSS Where to apply for food stamps, Medicaid and utility assistance. 914-245-6550 ? [facility]: Transportation to medical appointments. 228-147-6970 ? Social Security Administration: May apply for disability if have a Stage IV cancer. 579-558-3760 (702) 382-5472 ? [employer], Disability and Transit Services: Assists with nutrition, care and transit needs. [place] Support Programs: '@10RELATIVEDAYS'$ @ > Cancer Support Group  2nd Tuesday of the month 1pm-2pm, Journey Room  > Creative Journey  3rd Tuesday of the month 1130am-1pm, Journey Room  > Look Good Feel Better  1st Wednesday of the month 10am-12 noon, Journey Room (Call [place] to register [phone])

## 2015-09-23 NOTE — Progress Notes (Signed)
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  D/C'd due to arthralgias and myalgias   04/19/2012 -  Chemotherapy Tamoxifen started     CURRENT THERAPY: Initially started on Arimidex in Feb/March 2013, but developed side effects and switched to Tamoxifen in Jan 2014.   INTERVAL HISTORY: Julie Clay 75 y.o. 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  Started on Arimidex in Feb or March 2013 but developed intolerance to this medication.  She was therefore switched to Tamoxifen in Jan 2014.  She will take anti-endocrine therapy for 5 years.  She declined radiation therapy at the time of presentation in December 2012. She attributes her oncotype score to seeing a naturopathic physician at the time of her cancer diagnosis. She was given mushroom supplements, and they suggested to cut out white flour and eat a cup of berries each day.  Julie Clay is unaccompanied. I personally reviewed and went over mammogram results with the patient.  She is feeling fine. She denies any problems with the tamoxifen. She is eating well and sleeping no worse than usual. She is not worried about anything and has no complaints at this time. 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 She is planning to go on her first cruise in September and will be traveling to the [place].  She is up to date on her mammograms with the last performed in March 2017. She denies any new  pain. No change in appetite or energy level.   Past Medical History  Diagnosis Date  . Hypertension   . Hyperlipidemia   . Diabetes mellitus   . Asthma     spirometry (2011)- mild ventilary defect  . Cancer ([place]) 2012    Breast  . Arthritis     Osteoarthritis  . Fatty liver 2013    enlarged  . Sliding hiatal hernia   . Ventral hernia, unspecified, without mention of obstruction or gangrene   . Lung nodule seen on imaging study 2013  . HSV (herpes simplex virus) infection   . Lymphedema     Left arm  . 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 Calcification of left breast   . Arthritis   . Shortness of breath     with exertion  . Sleep apnea     Stop Bang score of 5. Pt has had sleep study, but was shown to be negative for sleep apnea.  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 ([place]); Essential hypertension, benign; DM (diabetes mellitus) ([place]); OA (osteoarthritis); Morbidly obese ([place]); Asthma; Hyperlipidemia; Chronic fatigue; OSA (obstructive sleep apnea); Dyspnea; HSV (herpes simplex virus) infection; Peripheral edema; Pain in joint, shoulder region; Rotator cuff syndrome of left shoulder; Muscle weakness (generalized); Occupational therapy encounter; Hip pain; Insomnia; Pain, joint, multiple sites; CKD (chronic kidney disease), stage II; Diabetic neuropathy ([place]); Pre-ulcerative calluses; Pelvic pain; Bladder  prolapse, female, acquired; Endometrial polyp; Rectocele; Atypical ductal hyperplasia of left breast; Palpitations; Post-operative state; Skin irritation; Elevated LFTs; Leg cramps; Ganglion cyst of flexor tendon sheath of finger of left hand; Lymphedema; and Fatty liver disease, nonalcoholic on her problem list.     is allergic to arimidex; adhesive; lisinopril; and tetracyclines & related.  Julie Clay had no medications administered during this visit.  Past Surgical History  Procedure  Laterality Date  . Cesarean section    . 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 Breast surgery  jan 2013  . Hand ligament reconstruction Right   . Cataract extraction, bilateral      on different occassions   . Shoulder arthroscopy with open rotator cuff repair Left 07/26/2012  . Breast biopsy  06/22/13  . Breast biopsy Left 09/12/2013    Procedure: BREAST BIOPSY WITH NEEDLE LOCALIZATION;  Surgeon: Jamesetta So, MD;  Location: [facility];  Service: General;  Laterality: Left;  . Hysteroscopy w/d&c N/A 11/06/2013    Procedure: DILATATION AND CURETTAGE /HYSTEROSCOPY;  Surgeon: Jonnie Kind, MD;  Location: [facility];  Service: Gynecology;  Laterality: N/A;  . Polypectomy N/A 11/06/2013    Procedure: POLYPECTOMY (REMOVAL ENDOMETRIAL POLYP);  Surgeon: Jonnie Kind, MD;  Location: [facility];  Service: Gynecology;  Laterality: N/A;  . Rectocele repair N/A 11/06/2013    Procedure: POSTERIOR REPAIR (RECTOCELE);  Surgeon: Jonnie Kind, MD;  Location: [facility];  Service: Gynecology;  Laterality: N/A;    Denies any headaches, dizziness, double vision, fevers, chills, night sweats, nausea, vomiting, diarrhea, constipation, chest pain, heart palpitations, shortness of breath, blood in stool, black tarry stool, urinary pain, urinary burning, urinary frequency, hematuria. Denies vaginal bleeding. 14 point review of systems was performed and is negative except as detailed under history of present illness and above   PHYSICAL EXAMINATION  ECOG PERFORMANCE STATUS: 1 - Symptomatic but completely ambulatory  Filed Vitals:   09/23/15 1335  BP: 150/86  Pulse: 88  Temp: 98.3 F (36.8 C)  Resp: 18    GENERAL:alert, no distress, well nourished, well developed, comfortable, cooperative, obese and smiling SKIN: skin color, texture, turgor are normal, no rashes or significant lesions HEAD: Normocephalic, No masses, lesions, tenderness or abnormalities EYES: normal, PERRLA, EOMI, Conjunctiva are pink and  non-injected EARS: External ears normal OROPHARYNX:mucous membranes are moist  NECK: supple, no adenopathy, thyroid normal size, non-tender, without nodularity, no stridor, non-tender, trachea midline LYMPH:  no palpable lymphadenopathy LUNGS: clear to auscultation and percussion HEART: regular rate & rhythm, SEM, no gallops, S1 normal and S2 normal ABDOMEN:obese and normal bowel sounds BACK: Back symmetric, no curvature. EXTREMITIES:less then 2 second capillary refill, no joint deformities, effusion, or inflammation, no skin discoloration, no cyanosis  NEURO: alert & oriented x 3 with fluent speech, no focal motor/sensory deficits, gait normal BREAST: Left breast, one incision at the 6 o'clock position inferior to the nipple and deformed nipple from incision site. No palpable masses appreciated. No skin changes. Right breast is without palpable mass or abnormality. Bilateral nipples are flat, not inverted. No palpable axillary adenopathy.  LABORATORY DATA: I have reviewed the data as listed. CBC    Component Value Date/Time   WBC 8.7 09/11/2015 0911   RBC 4.64 09/11/2015 0911   HGB 12.5 09/11/2015 0911   HCT 38.4 09/11/2015 0911   PLT 229 09/11/2015 0911   MCV 82.8 09/11/2015 0911   MCH 26.9* 09/11/2015 0911   MCHC 32.6 09/11/2015 0911  RDW 14.3 09/11/2015 0911   LYMPHSABS 2610 09/11/2015 0911   MONOABS 609 09/11/2015 0911   EOSABS 174 09/11/2015 0911   BASOSABS 0 09/11/2015 0911      Chemistry      Component Value Date/Time   NA 143 09/11/2015 0911   K 3.9 09/11/2015 0911   CL 105 09/11/2015 0911   CO2 26 09/11/2015 0911   BUN 24 09/11/2015 0911   CREATININE 0.90 09/11/2015 0911   CREATININE 1.02* 11/07/2014 0837      Component Value Date/Time   CALCIUM 9.2 09/11/2015 0911   ALKPHOS 35 09/11/2015 0911   AST 37* 09/11/2015 0911   ALT 31* 09/11/2015 0911   BILITOT 0.4 09/11/2015 0911     RADIOLOGY: I have personally reviewed the radiological images as listed and  agreed with the findings in the report. Study Result     CLINICAL DATA: Annual examination. 75 year old with history of left breast cancer lumpectomy 2013. History of surgical excision of a atypical ductal hyperplasia in the left breast.  EXAM: DIGITAL DIAGNOSTIC BILATERAL MAMMOGRAM WITH 3D TOMOSYNTHESIS AND CAD  COMPARISON: Previous exam(s).  ACR Breast Density Category b: There are scattered areas of fibroglandular density.  FINDINGS: There are stable postsurgical changes in the left breast. No mass, nonsurgical distortion, or suspicious microcalcification is identified in either breast to suggest malignancy.  Mammographic images were processed with CAD.  IMPRESSION: No evidence of malignancy in either breast. Postsurgical changes left breast.  RECOMMENDATION: Diagnostic mammogram is suggested in 1 year. (Code:DM-B-01Y)  I have discussed the findings and recommendations with the patient. Results were also provided in writing at the conclusion of the visit. If applicable, a reminder letter will be sent to the patient regarding the next appointment.  BI-RADS CATEGORY 2: Benign.   Electronically Signed  By: Curlene Dolphin M.D.  On: 06/24/2015 09:32     ASSESSMENT:  1. Left-sided stage I cancer the breast infiltrating ductal type ER positive, PR positive, HER-2/neu nonamplified with an Oncotype DX score 0.  Started on Arimidex in Feb or March 2013 but developed intolerance to this medication.  She was therefore switched to Tamoxifen in Jan 2014.  She will take anti-endocrine therapy for 5 years.  She declined radiation therapy at the time of presentation in December 2012.  2.  Left breast atypical lobular hyperplasia, S/P lumpectomy by Dr. Arnoldo Morale on 09/12/2013 3. Left full-thickness supraspinatus tear with mild partial-thickness superior infraspinatus tear requiring arthroscopic intervention in April 2014  4. Multiple left breast biopsies (March 2015 and  May 2015).Negative work-up. 5. Elevated LFT's  Patient Active Problem List   Diagnosis Date Noted  . Fatty liver disease, nonalcoholic 82/99/3716  . Lymphedema 02/18/2015  . Leg cramps 08/02/2014  . Ganglion cyst of flexor tendon sheath of finger of left hand 08/02/2014  . Elevated LFTs 04/28/2014  . Skin irritation 01/19/2014  . Post-operative state 11/14/2013  . Palpitations 10/12/2013  . Atypical ductal hyperplasia of left breast 07/12/2013  . Endometrial polyp 06/25/2013  . Rectocele 06/25/2013  . Diabetic neuropathy ([place]) 05/09/2013  . Pre-ulcerative calluses 05/09/2013  . Pelvic pain 05/09/2013  . Bladder prolapse, female, acquired 05/09/2013  . Pain, joint, multiple sites 03/26/2013  . 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 Insomnia 11/29/2012  . Hip pain 09/08/2012  . Occupational therapy encounter 08/30/2012  . Muscle weakness (generalized) 06/26/2012  . Rotator cuff syndrome of left shoulder 06/21/2012  . Pain in joint, shoulder region 06/16/2012  . Peripheral edema 02/04/2012  .  HSV (herpes simplex virus) infection 12/09/2011  . Dyspnea 11/15/2011  . 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 Essential hypertension, benign 10/11/2011  . DM (diabetes mellitus) ([place]) 10/11/2011  . OA (osteoarthritis) 10/11/2011  . Morbidly obese ([place]) 10/11/2011  . Asthma 10/11/2011  . Hyperlipidemia 10/11/2011  . Chronic fatigue 10/11/2011  . OSA (obstructive sleep apnea) 10/11/2011    PLAN:  She will continue with her Tamoxifen daily. She is up to date on screening mammography.Last mammogram on 06/24/2015. She takes vitamin D but not calcium.  She follows with her primary care physician as required. She has minimal lymphedema on exam today. She should continue wearing her sleeve and with massage as instructed.  Ongoing weight loss was encouraged.   She will return in 6 months with repeat Physical Exam. At this next visit, I will  schedule for her next mammogram.  THERAPY PLAN:  NCCN guidelines recommends the following surveillance for invasive breast cancer:  A. History and Physical exam every 4-6 months for 5 years and then every 12 months.  B. Mammography every 12 months  C. Women on Tamoxifen: annual gynecologic assessment every 12 months if uterus is present.  D. Women on aromatase inhibitor or who experience ovarian failure secondary to treatment should have monitoring of bone health with a bone mineral density determination at baseline and periodically thereafter.  E. Assess and encourage adherence to adjuvant endocrine therapy.  F. Evidence suggests that active lifestyle and achieving and maintaining an ideal body weight (20-25 BMI) may lead to optimal breast cancer outcomes.    All questions were answered. The patient knows to call the clinic with any problems, questions or concerns. We can certainly see the patient much sooner if necessary.  This document serves as a record of services personally performed by Ancil Linsey, MD. It was created on her behalf by Arlyce Harman, a trained medical scribe. The creation of this record is based on the scribe's personal observations and the provider's statements to them. This document has been checked and approved by the attending provider.  I have reviewed the above documentation for accuracy and completeness, and I agree with the above.  This document was electronically signed. Molli Hazard, MD

## 2015-09-24 ENCOUNTER — Encounter (HOSPITAL_COMMUNITY): Payer: Self-pay | Admitting: Hematology & Oncology

## 2015-10-02 ENCOUNTER — Other Ambulatory Visit: Payer: Self-pay | Admitting: Family Medicine

## 2015-10-07 NOTE — Telephone Encounter (Signed)
Refill appropriate and filled per protocol. 

## 2015-10-10 DIAGNOSIS — M25561 Pain in right knee: Secondary | ICD-10-CM | POA: Diagnosis not present

## 2015-10-12 ENCOUNTER — Other Ambulatory Visit: Payer: Self-pay | Admitting: Family Medicine

## 2015-10-13 NOTE — Telephone Encounter (Signed)
Refill appropriate and filled per protocol. 

## 2015-10-17 ENCOUNTER — Other Ambulatory Visit: Payer: Self-pay | Admitting: Family Medicine

## 2015-10-17 NOTE — Telephone Encounter (Signed)
Refill appropriate and filled per protocol. 

## 2015-11-19 ENCOUNTER — Other Ambulatory Visit: Payer: Self-pay | Admitting: Family Medicine

## 2015-11-19 NOTE — Telephone Encounter (Signed)
Refill appropriate and filled per protocol. 

## 2015-12-08 ENCOUNTER — Encounter (INDEPENDENT_AMBULATORY_CARE_PROVIDER_SITE_OTHER): Payer: Self-pay | Admitting: Internal Medicine

## 2015-12-08 ENCOUNTER — Ambulatory Visit (INDEPENDENT_AMBULATORY_CARE_PROVIDER_SITE_OTHER): Payer: Medicare Other | Admitting: Internal Medicine

## 2015-12-08 VITALS — BP 150/60 | HR 72 | Temp 97.9°F | Ht 65.0 in | Wt 232.8 lb

## 2015-12-08 DIAGNOSIS — R748 Abnormal levels of other serum enzymes: Secondary | ICD-10-CM | POA: Diagnosis not present

## 2015-12-08 LAB — HEPATIC FUNCTION PANEL
ALK PHOS: 38 U/L (ref 33–130)
ALT: 40 U/L — ABNORMAL HIGH (ref 6–29)
AST: 45 U/L — AB (ref 10–35)
Albumin: 3.8 g/dL (ref 3.6–5.1)
BILIRUBIN DIRECT: 0.1 mg/dL (ref <=0.2)
BILIRUBIN INDIRECT: 0.3 mg/dL (ref 0.2–1.2)
BILIRUBIN TOTAL: 0.4 mg/dL (ref 0.2–1.2)
Total Protein: 6.3 g/dL (ref 6.1–8.1)

## 2015-12-08 NOTE — Progress Notes (Addendum)
Subjective:    Patient ID: Julie Clay, female    DOB: Oct 02, 1940, 75 y.o.   MRN: [id number]  HPI Here today for f/u of her elevated liver enzymes. She was last seen in February of this year. She tells me she is doing good.  Her appetite is good. She has lost one pound since I saw her.  She is not exercising. She does walk some.  She has a BM 1-2 a day. No melena or BRRB.   Hepatitis markers were normal. CRP, ,ANA,SMA, mitochondrial were normal.   Hepatic Function Latest Ref Rng & Units 09/11/2015 05/19/2015 02/14/2015  Total Protein 6.1 - 8.1 g/dL 6.0(L) 6.4 6.4  Albumin 3.6 - 5.1 g/dL 3.5(L) 3.7 3.8  AST 10 - 35 U/L 37(H) 43(H) 48(H)  ALT 6 - 29 U/L 31(H) 44(H) 43(H)  Alk Phosphatase 33 - 130 U/L 35 40 43  Total Bilirubin 0.2 - 1.2 mg/dL 0.4 0.4 0.4  Bilirubin, Direct <=0.2 mg/dL - - -   11/14/2014 US abdomen: elevated liver enzymes, Hx of breast cancer:   IMPRESSION: 1. Echogenic inhomogeneous liver parenchyma consistent with fatty infiltration. No focal hepatic abnormality is seen. 2. No gallstones. 3. Hepatomegaly. 4. Some of the anatomy particularly the pancreas and abdominal aorta are obscured by bowel gas and body habitus.   Review of Systems Past Medical History:  Diagnosis Date  . Arthritis    Osteoarthritis  . Arthritis   . Asthma    spirometry (2011)- mild ventilary defect  . Calcification of left breast   . Cancer ([place]) 2012   Breast  . Diabetes mellitus   . Dysrhythmia    palpatations  . Fatty liver 2013   enlarged  . HSV (herpes simplex virus) infection   . Hyperlipidemia   . Hypertension   . 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 Lung nodule seen on imaging study 2013  . Lymphedema    Left arm  . Shortness of breath    with exertion  . Sleep apnea    Stop Bang score of 5. Pt has had sleep study, but was shown to be negative for sleep apnea.  . Sliding hiatal hernia   . Ventral hernia, unspecified,  without mention of obstruction or gangrene     Past Surgical History:  Procedure Laterality Date  . BREAST BIOPSY  06/22/13  . BREAST BIOPSY Left 09/12/2013   Procedure: BREAST BIOPSY WITH NEEDLE LOCALIZATION;  Surgeon: Jamesetta So, MD;  Location: [facility];  Service: General;  Laterality: Left;  . BREAST SURGERY  jan 2013  . CATARACT EXTRACTION, BILATERAL     on different occassions   . CESAREAN SECTION    . HAND LIGAMENT RECONSTRUCTION Right   . HYSTEROSCOPY W/D&C N/A 11/06/2013   Procedure: DILATATION AND CURETTAGE /HYSTEROSCOPY;  Surgeon: Jonnie Kind, MD;  Location: [facility];  Service: Gynecology;  Laterality: N/A;  . LUNG LOBECTOMY Right 1988   Fungal Infection  . POLYPECTOMY N/A 11/06/2013   Procedure: POLYPECTOMY (REMOVAL ENDOMETRIAL POLYP);  Surgeon: Jonnie Kind, MD;  Location: [facility];  Service: Gynecology;  Laterality: N/A;  . RECTOCELE REPAIR N/A 11/06/2013   Procedure: POSTERIOR REPAIR (RECTOCELE);  Surgeon: Jonnie Kind, MD;  Location: [facility];  Service: Gynecology;  Laterality: N/A;  . SHOULDER ARTHROSCOPY WITH OPEN ROTATOR CUFF REPAIR Left 07/26/2012    Allergies  Allergen Reactions  . Arimidex [Anastrozole] Other (See Comments)    Arthralgias and myalgias.  Improved  with 3 week hiatus from drug.  Categorical side effect of drug class.  . Adhesive [Tape] Other (See Comments)    "red and blistered"  . Lisinopril     Dry hacking cough and tickle  . Tetracyclines & Related Other (See Comments)    unknown    Current Outpatient Prescriptions on File Prior to Visit  Medication Sig Dispense Refill  . acetaminophen (TYLENOL) 500 MG tablet Take 500 mg by mouth as needed for moderate pain. Takes 2 when needed    . acyclovir (ZOVIRAX) 200 MG capsule Take 200 mg by mouth daily as needed (herpes).    Marland Kitchen albuterol (PROVENTIL HFA;VENTOLIN HFA) 108 (90 BASE) MCG/ACT inhaler Inhale 2 puffs into the lungs every 4 (four) hours as needed. 6.7 g 6  . Aloe Vera Leaf POWD Apply 800  mg topically 2 (two) times daily.     . Ascorbic Acid (VITAMIN C) 1000 MG tablet Take 1,000 mg by mouth 2 (two) times daily.    Marland Kitchen b complex vitamins tablet Take 1 tablet by mouth 2 (two) times daily.     . Cholecalciferol (VITAMIN D3) 2000 UNITS TABS Take 2,000 Units by mouth 2 (two) times daily.    Mariane Baumgarten Calcium (STOOL SOFTENER PO) Take 1 capsule by mouth as needed (constipation).     . DULERA 100-5 MCG/ACT AERO INHALE 2 PUFFS BY MOUTH TWICE DAILY 13 g 0  . Echinacea 380 MG CAPS Take 1 capsule by mouth as needed (cold/flue).    . fluocinonide cream (LIDEX) 8.24 % Apply 1 application topically 2 (two) times daily as needed (poison ivy/bites/hives). 30 g 11  . Ginkgo Biloba 120 MG CAPS Take 1 capsule by mouth daily.     Marland Kitchen glipiZIDE (GLUCOTROL) 10 MG tablet TAKE 1 TABLET BY MOUTH TWICE DAILY BEFORE A MEAL 180 tablet 0  . Glucose Blood (BLOOD GLUCOSE TEST STRIPS) STRP Please dispense One touch Ultra. Use as directed to monitor FSBS 1x daily. Dx: E11.9. 200 each 3  . HYDROcodone-acetaminophen (NORCO) 10-325 MG tablet Take 1 tablet by mouth every 6 (six) hours as needed.    Marland Kitchen ibuprofen (ADVIL,MOTRIN) 800 MG tablet Take 800 mg by mouth daily as needed for moderate pain.    Marland Kitchen JANUVIA 50 MG tablet TAKE 1 TABLET BY MOUTH DAILY 90 tablet 3  . KRILL OIL PO Take 1 capsule by mouth 2 (two) times daily.    . Magnesium 400 MG TABS Take 400 mg by mouth daily.    . Melatonin 3 MG TABS Take 3 mg by mouth at bedtime as needed and may repeat dose one time if needed (sleep).     . metFORMIN (GLUCOPHAGE) 1000 MG tablet TAKE 1 TABLET BY MOUTH TWICE DAILY WITH A MEAL 180 tablet 3  . montelukast (SINGULAIR) 10 MG tablet TAKE 1 TABLET BY MOUTH EVERY NIGHT AT BEDTIME 90 tablet 0  . Multiple Vitamin (MULTIVITAMIN WITH MINERALS) TABS tablet Take 1 tablet by mouth daily.    . Naftifine HCl 1 % GEL Apply 1 application topically daily as needed.    . naproxen (NAPROSYN) 500 MG tablet Take 1 tablet (500 mg total) by mouth  2 (two) times daily as needed. 180 tablet 1  . NONFORMULARY OR COMPOUNDED ITEM Apply 1 application topically 2 (two) times daily as needed. Reported on 06/10/2015    . rosuvastatin (CRESTOR) 5 MG tablet TAKE 1 TABLET(5 MG) BY MOUTH DAILY 90 tablet 0  . simethicone (MYLICON) 80 MG chewable tablet Chew 80  mg by mouth as needed for flatulence.     . tamoxifen (NOLVADEX) 20 MG tablet Take 1 tablet (20 mg total) by mouth at bedtime. 30 tablet 5  . tiZANidine (ZANAFLEX) 4 MG tablet TAKE 1 TABLET BY MOUTH EVERY NIGHT AT BEDTIME AS NEEDED FOR MUSCLE SPASMS 90 tablet 1  . triamterene-hydrochlorothiazide (MAXZIDE) 75-50 MG tablet Take 1 tablet by mouth daily. 90 tablet 1   No current facility-administered medications on file prior to visit.        Objective:   Physical Exam Blood pressure (!) 150/60, pulse 72, temperature 97.9 F (36.6 C), height _0  (1.651 m), weight 232 lb 12.8 oz (105.6 kg).  Alert and oriented. Skin warm and dry. Oral mucosa is moist.   . Sclera anicteric, conjunctivae is pink. Thyroid not enlarged. No cervical lymphadenopathy. Lungs clear. Heart regular rate and rhythm.  Abdomen is soft. Bowel sounds are positive. No hepatomegaly. No abdominal masses felt. No tenderness.  No edema to lower extremities.        Assessment & Plan:  Elevated Liver enzymes: Her liver enyzmes are normal., Hepatic function today. OV in 6 months.

## 2015-12-08 NOTE — Patient Instructions (Signed)
OV in 6 months. 

## 2015-12-18 ENCOUNTER — Other Ambulatory Visit: Payer: Self-pay | Admitting: Family Medicine

## 2015-12-20 LAB — HM DIABETES EYE EXAM

## 2016-01-15 ENCOUNTER — Other Ambulatory Visit: Payer: Self-pay | Admitting: Family Medicine

## 2016-01-27 ENCOUNTER — Encounter: Payer: Self-pay | Admitting: Family Medicine

## 2016-01-27 ENCOUNTER — Ambulatory Visit (INDEPENDENT_AMBULATORY_CARE_PROVIDER_SITE_OTHER): Payer: Medicare Other | Admitting: Family Medicine

## 2016-01-27 VITALS — BP 132/62 | HR 85 | Temp 98.6°F | Resp 16 | Wt 230.0 lb

## 2016-01-27 DIAGNOSIS — Z23 Encounter for immunization: Secondary | ICD-10-CM

## 2016-01-27 DIAGNOSIS — I10 Essential (primary) hypertension: Secondary | ICD-10-CM

## 2016-01-27 DIAGNOSIS — E1143 Type 2 diabetes mellitus with diabetic autonomic (poly)neuropathy: Secondary | ICD-10-CM

## 2016-01-27 DIAGNOSIS — E1141 Type 2 diabetes mellitus with diabetic mononeuropathy: Secondary | ICD-10-CM | POA: Diagnosis not present

## 2016-01-27 DIAGNOSIS — S3992XA Unspecified injury of lower back, initial encounter: Secondary | ICD-10-CM

## 2016-01-27 DIAGNOSIS — R7611 Nonspecific reaction to tuberculin skin test without active tuberculosis: Secondary | ICD-10-CM

## 2016-01-27 DIAGNOSIS — N182 Chronic kidney disease, stage 2 (mild): Secondary | ICD-10-CM

## 2016-01-27 DIAGNOSIS — M25551 Pain in right hip: Secondary | ICD-10-CM | POA: Diagnosis not present

## 2016-01-27 DIAGNOSIS — M25552 Pain in left hip: Secondary | ICD-10-CM | POA: Diagnosis not present

## 2016-01-27 NOTE — Progress Notes (Signed)
Subjective:    Patient ID: Julie Clay, female    DOB: 18-Sep-1940, 75 y.o.   MRN: [id number]  Patient presents for Follow-up Patient to follow-up chronic medical problems. Medications reviewed.  She is still following with gastroenterology for her elevated liver function tests secondary to fatty liver or levels have been stable. She is also following with oncology for history of infiltrating ductal carcinoma the breast Diabetes mellitus her goal is less than 7.5% with her age her fasting blood sugars < 120 when she takes them, does not take regulary  no hypoglycemia  ,  last A1c 4 months ago was 7.5% LDL was at goal renal function has been preserved  - needs new diabetic shoes  Due for flu shot today   She's had increased pain over her tailbone and into her left hip. She did recently go on vacation and had to sit in a wheelchair for most of the time. Did not have a good cushingoid and she has had pain for the past couple weeks which is not improving.  She is volunteering for the [employer] she always reacts to the PPD she needs a chest x-ray and TB gold test  Review Of Systems:  GEN- denies fatigue, fever, weight loss,weakness, recent illness HEENT- denies eye drainage, change in vision, nasal discharge, CVS- denies chest pain, palpitations RESP- denies SOB, cough, wheeze ABD- denies N/V, change in stools, abd pain GU- denies dysuria, hematuria, dribbling, incontinence MSK- +joint pain, muscle aches, injury Neuro- denies headache, dizziness, syncope, seizure activity       Objective:    BP 132/62   Pulse 85   Temp 98.6 F (37 C) (Oral)   Resp 16   Wt 230 lb (104.3 kg)   SpO2 98%   BMI 38.27 kg/m  GEN- NAD, alert and oriented x3 HEENT- PERRL, EOMI, non injected sclera, pink conjunctiva, MMM, oropharynx clear CVS- RRR, systolic murmur  RESP-CTAB ABD-NABS,soft, NT,ND, Large pannus EXT- pedal edema       Assessment & Plan:      Problem List Items Addressed This  Visit    Morbidly obese (HCC) - Primary   Hip pain    With age, no fall, likley OA and sitting in the chair for long periods of time could have aggravated this. She does have point tenderness over coccyx, obtain xray Use pillow at home Has pain meds      Relevant Orders   DG HIPS BILAT W OR W/O PELVIS 3-4 VIEWS   Essential hypertension, benign    Controlled no changes      Relevant Orders   Basic metabolic panel   DM (diabetes mellitus) ([place])    Diabetes is well-controlled. She does have underlying neuropathy benefits from diabetic shoes will reorder these today.  no change to meds on statin      Relevant Orders   CBC with Differential/Platelet   Hemoglobin S0Y   Basic metabolic panel   Diabetic neuropathy (HCC)   CKD (chronic kidney disease), stage II   Relevant Orders   Basic metabolic panel    Other Visit Diagnoses    Positive TB test       Relevant Orders   Quantiferon tb gold assay   DG Chest 2 View   Tailbone injury, initial encounter       Relevant Orders   DG Sacrum/Coccyx   Flu vaccine need       Relevant Orders   Flu Vaccine QUAD 36+ mos IM (Completed)  Note: This dictation was prepared with Dragon dictation along with smaller phrase technology. Any transcriptional errors that result from this process are unintentional.

## 2016-01-27 NOTE — Patient Instructions (Addendum)
F/U 4 months Get xray of hips/pelvis Diabetic shoes to be done Get labs done fasting

## 2016-01-28 NOTE — Assessment & Plan Note (Addendum)
Diabetes is well-controlled. She does have underlying neuropathy benefits from diabetic shoes will reorder these today.  no change to meds on statin

## 2016-01-28 NOTE — Assessment & Plan Note (Signed)
Controlled no changes 

## 2016-01-28 NOTE — Assessment & Plan Note (Signed)
With age, no fall, likley OA and sitting in the chair for long periods of time could have aggravated this. She does have point tenderness over coccyx, obtain xray Use pillow at home Has pain meds

## 2016-01-30 ENCOUNTER — Other Ambulatory Visit (HOSPITAL_COMMUNITY): Payer: Self-pay

## 2016-01-30 ENCOUNTER — Other Ambulatory Visit: Payer: Self-pay | Admitting: Family Medicine

## 2016-01-30 ENCOUNTER — Ambulatory Visit (HOSPITAL_COMMUNITY)
Admission: RE | Admit: 2016-01-30 | Discharge: 2016-01-30 | Disposition: A | Discharge status: Home / Self Care | Payer: Medicare Other | Source: Ambulatory Visit | Attending: Family Medicine | Admitting: Family Medicine

## 2016-01-30 DIAGNOSIS — R918 Other nonspecific abnormal finding of lung field: Secondary | ICD-10-CM | POA: Diagnosis not present

## 2016-01-30 DIAGNOSIS — M25551 Pain in right hip: Secondary | ICD-10-CM | POA: Insufficient documentation

## 2016-01-30 DIAGNOSIS — M533 Sacrococcygeal disorders, not elsewhere classified: Secondary | ICD-10-CM | POA: Insufficient documentation

## 2016-01-30 DIAGNOSIS — I10 Essential (primary) hypertension: Secondary | ICD-10-CM | POA: Diagnosis not present

## 2016-01-30 DIAGNOSIS — E1143 Type 2 diabetes mellitus with diabetic autonomic (poly)neuropathy: Secondary | ICD-10-CM | POA: Diagnosis not present

## 2016-01-30 DIAGNOSIS — M25552 Pain in left hip: Secondary | ICD-10-CM | POA: Diagnosis not present

## 2016-01-30 DIAGNOSIS — S3992XA Unspecified injury of lower back, initial encounter: Secondary | ICD-10-CM

## 2016-01-30 DIAGNOSIS — N182 Chronic kidney disease, stage 2 (mild): Secondary | ICD-10-CM | POA: Diagnosis not present

## 2016-01-30 DIAGNOSIS — C50912 Malignant neoplasm of unspecified site of left female breast: Secondary | ICD-10-CM

## 2016-01-30 DIAGNOSIS — R7611 Nonspecific reaction to tuberculin skin test without active tuberculosis: Secondary | ICD-10-CM | POA: Insufficient documentation

## 2016-01-30 LAB — CBC WITH DIFFERENTIAL/PLATELET
Basophils Absolute: 0 cells/uL (ref 0–200)
Basophils Relative: 0 %
EOS ABS: 243 {cells}/uL (ref 15–500)
Eosinophils Relative: 3 %
HEMATOCRIT: 39.7 % (ref 35.0–45.0)
Hemoglobin: 13 g/dL (ref 12.0–15.0)
LYMPHS PCT: 28 %
Lymphs Abs: 2268 cells/uL (ref 850–3900)
MCH: 27.5 pg (ref 27.0–33.0)
MCHC: 32.7 g/dL (ref 32.0–36.0)
MCV: 84.1 fL (ref 80.0–100.0)
MONO ABS: 648 {cells}/uL (ref 200–950)
MPV: 11.1 fL (ref 7.5–12.5)
Monocytes Relative: 8 %
NEUTROS PCT: 61 %
Neutro Abs: 4941 cells/uL (ref 1500–7800)
Platelets: 225 10*3/uL (ref 140–400)
RBC: 4.72 MIL/uL (ref 3.80–5.10)
RDW: 14.1 % (ref 11.0–15.0)
WBC: 8.1 10*3/uL (ref 3.8–10.8)

## 2016-01-30 MED ORDER — TAMOXIFEN CITRATE 20 MG PO TABS: 20.0000 mg | ORAL_TABLET | Freq: Every day | ORAL | 5 refills | Status: DC

## 2016-01-30 NOTE — Telephone Encounter (Signed)
Received refill request from patients pharmacy for Tamoxifen. Chart checked and refilled.

## 2016-01-31 LAB — BASIC METABOLIC PANEL
BUN: 24 mg/dL (ref 7–25)
CALCIUM: 9.2 mg/dL (ref 8.6–10.4)
CO2: 28 mmol/L (ref 20–31)
CREATININE: 1.13 mg/dL — AB (ref 0.60–0.93)
Chloride: 103 mmol/L (ref 98–110)
GLUCOSE: 143 mg/dL — AB (ref 70–99)
Potassium: 4.3 mmol/L (ref 3.5–5.3)
Sodium: 141 mmol/L (ref 135–146)

## 2016-01-31 LAB — HEMOGLOBIN A1C
Hgb A1c MFr Bld: 7.1 % — ABNORMAL HIGH (ref <5.7)
Mean Plasma Glucose: 157 mg/dL

## 2016-02-02 LAB — QUANTIFERON TB GOLD ASSAY (BLOOD)
Mitogen-Nil: 0.17 IU/mL
Quantiferon Nil Value: 0.08 IU/mL
Quantiferon Tb Ag Minus Nil Value: 0.02 IU/mL

## 2016-02-03 DIAGNOSIS — M533 Sacrococcygeal disorders, not elsewhere classified: Secondary | ICD-10-CM | POA: Diagnosis not present

## 2016-02-03 DIAGNOSIS — M1712 Unilateral primary osteoarthritis, left knee: Secondary | ICD-10-CM | POA: Diagnosis not present

## 2016-02-05 ENCOUNTER — Encounter: Payer: Self-pay | Admitting: Family Medicine

## 2016-03-06 ENCOUNTER — Other Ambulatory Visit: Payer: Self-pay | Admitting: Family Medicine

## 2016-03-24 ENCOUNTER — Ambulatory Visit (HOSPITAL_COMMUNITY): Payer: Medicare Other | Admitting: Hematology & Oncology

## 2016-04-06 ENCOUNTER — Other Ambulatory Visit: Payer: Self-pay | Admitting: Family Medicine

## 2016-04-10 ENCOUNTER — Other Ambulatory Visit: Payer: Self-pay | Admitting: Family Medicine

## 2016-04-16 ENCOUNTER — Ambulatory Visit (INDEPENDENT_AMBULATORY_CARE_PROVIDER_SITE_OTHER): Payer: Medicare Other | Admitting: Family Medicine

## 2016-04-16 ENCOUNTER — Encounter: Payer: Self-pay | Admitting: Family Medicine

## 2016-04-16 VITALS — BP 110/50 | HR 94 | Temp 98.7°F | Resp 18 | Ht 65.0 in | Wt 231.0 lb

## 2016-04-16 DIAGNOSIS — J310 Chronic rhinitis: Secondary | ICD-10-CM

## 2016-04-16 DIAGNOSIS — J329 Chronic sinusitis, unspecified: Secondary | ICD-10-CM | POA: Diagnosis not present

## 2016-04-16 MED ORDER — MOMETASONE FURO-FORMOTEROL FUM 100-5 MCG/ACT IN AERO: 2.0000 | INHALATION_SPRAY | Freq: Two times a day (BID) | RESPIRATORY_TRACT | 5 refills | Status: DC

## 2016-04-16 MED ORDER — AMOXICILLIN 875 MG PO TABS
875.0000 mg | ORAL_TABLET | Freq: Two times a day (BID) | ORAL | 0 refills | Status: DC
Start: 2016-04-16 — End: 2016-04-27

## 2016-04-16 NOTE — Telephone Encounter (Signed)
ok 

## 2016-04-16 NOTE — Telephone Encounter (Signed)
Prescription sent to pharmacy.

## 2016-04-16 NOTE — Progress Notes (Signed)
Subjective:    Patient ID: Julie Clay, female    DOB: [birth year], 75 y.o.   MRN: [id number]  HPI   Patient's symptoms began 1 week ago.  She has similar symptoms to her husband. She reports pain and pressure in her frontal sinuses and maxillary sinuses bilaterally. She reports purulent nasal drainage. She reports a dull sinus headache. Symptoms are getting worse. Symptoms usually lead to bronchitis according to the patient Past Medical History:  Diagnosis Date  . Arthritis    Osteoarthritis  . Arthritis   . Asthma    spirometry (2011)- mild ventilary defect  . Calcification of left breast   . Cancer ([place]) 2012   Breast  . Diabetes mellitus   . Dysrhythmia    palpatations  . Fatty liver 2013   enlarged  . HSV (herpes simplex virus) infection   . Hyperlipidemia   . Hypertension   . 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 Lung nodule seen on imaging study 2013  . Lymphedema    Left arm  . Shortness of breath    with exertion  . Sleep apnea    Stop Bang score of 5. Pt has had sleep study, but was shown to be negative for sleep apnea.  . Sliding hiatal hernia   . Ventral hernia, unspecified, without mention of obstruction or gangrene    Past Surgical History:  Procedure Laterality Date  . BREAST BIOPSY  06/22/13  . BREAST BIOPSY Left 09/12/2013   Procedure: BREAST BIOPSY WITH NEEDLE LOCALIZATION;  Surgeon: Jamesetta So, MD;  Location: [facility];  Service: General;  Laterality: Left;  . BREAST SURGERY  jan 2013  . CATARACT EXTRACTION, BILATERAL     on different occassions   . CESAREAN SECTION    . HAND LIGAMENT RECONSTRUCTION Right   . HYSTEROSCOPY W/D&C N/A 11/06/2013   Procedure: DILATATION AND CURETTAGE /HYSTEROSCOPY;  Surgeon: Jonnie Kind, MD;  Location: [facility];  Service: Gynecology;  Laterality: N/A;  . LUNG LOBECTOMY Right 1988   Fungal Infection  . POLYPECTOMY N/A 11/06/2013   Procedure: POLYPECTOMY (REMOVAL ENDOMETRIAL POLYP);   Surgeon: Jonnie Kind, MD;  Location: [facility];  Service: Gynecology;  Laterality: N/A;  . RECTOCELE REPAIR N/A 11/06/2013   Procedure: POSTERIOR REPAIR (RECTOCELE);  Surgeon: Jonnie Kind, MD;  Location: [facility];  Service: Gynecology;  Laterality: N/A;  . SHOULDER ARTHROSCOPY WITH OPEN ROTATOR CUFF REPAIR Left 07/26/2012   Current Outpatient Prescriptions on File Prior to Visit  Medication Sig Dispense Refill  . acetaminophen (TYLENOL) 500 MG tablet Take 500 mg by mouth as needed for moderate pain. Takes 2 when needed    . acyclovir (ZOVIRAX) 200 MG capsule Take 200 mg by mouth daily as needed (herpes).    Marland Kitchen albuterol (PROVENTIL HFA;VENTOLIN HFA) 108 (90 BASE) MCG/ACT inhaler Inhale 2 puffs into the lungs every 4 (four) hours as needed. 6.7 g 6  . Aloe Vera Leaf POWD Apply 800 mg topically 2 (two) times daily.     . Ascorbic Acid (VITAMIN C) 1000 MG tablet Take 1,000 mg by mouth 2 (two) times daily.    Marland Kitchen b complex vitamins tablet Take 1 tablet by mouth 2 (two) times daily.     . Cholecalciferol (VITAMIN D3) 2000 UNITS TABS Take 2,000 Units by mouth 2 (two) times daily.    Mariane Baumgarten Calcium (STOOL SOFTENER PO) Take 1 capsule by mouth as needed (constipation).     . Echinacea  380 MG CAPS Take 1 capsule by mouth as needed (cold/flue).    . fluocinonide cream (LIDEX) 3.74 % Apply 1 application topically 2 (two) times daily as needed (poison ivy/bites/hives). 30 g 11  . Ginkgo Biloba 120 MG CAPS Take 1 capsule by mouth daily.     Marland Kitchen glipiZIDE (GLUCOTROL) 10 MG tablet TAKE 1 TABLET BY MOUTH TWICE DAILY BEFORE A MEAL 180 tablet 0  . Glucose Blood (BLOOD GLUCOSE TEST STRIPS) STRP Please dispense One touch Ultra. Use as directed to monitor FSBS 1x daily. Dx: E11.9. 200 each 3  . HYDROcodone-acetaminophen (NORCO) 10-325 MG tablet Take 1 tablet by mouth every 6 (six) hours as needed.    Marland Kitchen ibuprofen (ADVIL,MOTRIN) 800 MG tablet Take 800 mg by mouth daily as needed for moderate pain.    Marland Kitchen JANUVIA 50 MG  tablet TAKE 1 TABLET BY MOUTH DAILY 90 tablet 3  . KRILL OIL PO Take 1 capsule by mouth 2 (two) times daily.    . Magnesium 400 MG TABS Take 400 mg by mouth daily.    . Melatonin 3 MG TABS Take 3 mg by mouth at bedtime as needed and may repeat dose one time if needed (sleep).     . metFORMIN (GLUCOPHAGE) 1000 MG tablet TAKE 1 TABLET BY MOUTH TWICE DAILY WITH A MEAL 180 tablet 3  . montelukast (SINGULAIR) 10 MG tablet TAKE 1 TABLET BY MOUTH EVERY NIGHT AT BEDTIME 90 tablet 0  . Multiple Vitamin (MULTIVITAMIN WITH MINERALS) TABS tablet Take 1 tablet by mouth daily.    . Naftifine HCl 1 % GEL Apply 1 application topically daily as needed.    . naproxen (NAPROSYN) 500 MG tablet Take 1 tablet (500 mg total) by mouth 2 (two) times daily as needed. 180 tablet 1  . NONFORMULARY OR COMPOUNDED ITEM Apply 1 application topically 2 (two) times daily as needed. Reported on 06/10/2015    . rosuvastatin (CRESTOR) 5 MG tablet TAKE 1 TABLET(5 MG) BY MOUTH DAILY 90 tablet 0  . simethicone (MYLICON) 80 MG chewable tablet Chew 80 mg by mouth as needed for flatulence.     . tamoxifen (NOLVADEX) 20 MG tablet Take 1 tablet (20 mg total) by mouth at bedtime. 30 tablet 5  . tiZANidine (ZANAFLEX) 4 MG tablet TAKE 1 TABLET BY MOUTH EVERY NIGHT AT BEDTIME AS NEEDED FOR MUSCLE SPASMS 90 tablet 1  . triamterene-hydrochlorothiazide (MAXZIDE) 75-50 MG tablet TAKE 1 TABLET BY MOUTH DAILY 90 tablet 0   No current facility-administered medications on file prior to visit.    Allergies  Allergen Reactions  . Arimidex [Anastrozole] Other (See Comments)    Arthralgias and myalgias.  Improved with 3 week hiatus from drug.  Categorical side effect of drug class.  . Adhesive [Tape] Other (See Comments)    "red and blistered"  . Lisinopril     Dry hacking cough and tickle  . Tetracyclines & Related Other (See Comments)    unknown   Social History   Social History  . Marital status: Married    Spouse name: N/A  . Number of  children: N/A  . Years of education: N/A   Occupational History  . retired      Product manager for [employer]   Social History Main Topics  . Smoking status: Passive Smoke Exposure - Never Smoker  . Smokeless tobacco: Never Used     Comment: husband smokes, but pt doesn't   . Alcohol use 0.0 oz/week  Comment: occasional drink twice a year  . Drug use: No  . Sexual activity: Not Currently    Birth control/ protection: Post-menopausal   Other Topics Concern  . Not on file   Social History Narrative  . No narrative on file      Review of Systems  All other systems reviewed and are negative.      Objective:   Physical Exam  HENT:  Right Ear: External ear normal.  Left Ear: External ear normal.  Nose: Mucosal edema and rhinorrhea present. Right sinus exhibits maxillary sinus tenderness. Left sinus exhibits maxillary sinus tenderness.  Mouth/Throat: No oropharyngeal exudate.  Eyes: Conjunctivae are normal.  Neck: Neck supple.  Cardiovascular: Normal rate, regular rhythm and normal heart sounds.   Pulmonary/Chest: Effort normal and breath sounds normal. No respiratory distress. She has no wheezes. She has no rales.  Lymphadenopathy:    She has no cervical adenopathy.  Vitals reviewed.         Assessment & Plan:  Rhinosinusitis - Plan: amoxicillin (AMOXIL) 875 MG tablet  Amoxicillin 875 mg pobid for 10 days.

## 2016-04-16 NOTE — Telephone Encounter (Signed)
Ok to refill Zanaflex? 

## 2016-04-23 ENCOUNTER — Other Ambulatory Visit: Payer: Self-pay | Admitting: Family Medicine

## 2016-04-27 ENCOUNTER — Encounter (HOSPITAL_COMMUNITY): Payer: Medicare Other | Attending: Hematology & Oncology | Admitting: Hematology & Oncology

## 2016-04-27 ENCOUNTER — Encounter (HOSPITAL_COMMUNITY): Payer: Self-pay | Admitting: Hematology & Oncology

## 2016-04-27 VITALS — BP 136/63 | HR 87 | Temp 97.8°F | Resp 22 | Wt 230.0 lb

## 2016-04-27 DIAGNOSIS — I890 Lymphedema, not elsewhere classified: Secondary | ICD-10-CM

## 2016-04-27 DIAGNOSIS — C50912 Malignant neoplasm of unspecified site of left female breast: Secondary | ICD-10-CM | POA: Diagnosis present

## 2016-04-27 DIAGNOSIS — Z79810 Long term (current) use of selective estrogen receptor modulators (SERMs): Secondary | ICD-10-CM

## 2016-04-27 DIAGNOSIS — N6092 Unspecified benign mammary dysplasia of left breast: Secondary | ICD-10-CM

## 2016-04-27 DIAGNOSIS — Z5181 Encounter for therapeutic drug level monitoring: Secondary | ICD-10-CM

## 2016-04-27 NOTE — Progress Notes (Signed)
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  D/C'd due to arthralgias and myalgias      04/19/2012 -  Chemotherapy    Tamoxifen started       CURRENT THERAPY: Initially started on Arimidex in Feb/March 2013, but developed side effects and switched to Tamoxifen in Jan 2014.   INTERVAL HISTORY: Julie Clay 76 y.o. 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  Started on Arimidex in Feb or March 2013 but developed intolerance to this medication.  She was therefore switched to Tamoxifen in Jan 2014.  She will take anti-endocrine therapy for 5 years.  She declined radiation therapy at the time of presentation in December 2012. She attributes her oncotype score to seeing a naturopathic physician at the time of her cancer diagnosis. She was given mushroom supplements, and they suggested to cut out white flour and eat a cup of berries each day.  Julie Clay is accompanied by her husband.  She is still on Tamoxifen without issue. Denies hot flashes, no vaginal bleeding. She takes her medication everyday.   Appetite is fine. Energy is same as before. Denies new pain.   [name]. It is more often than it used to be, she attributes this to lymphedema. It is alleviated by massaging her breasts. She no longer wears a lymphedema sleeve or glove. She states it is "more trouble than it's worth" She doesn't perform regular self breast exams. Last mammogram was in March of 2017.    Past Medical History:  Diagnosis Date  . Arthritis    Osteoarthritis  . Arthritis   . Asthma    spirometry  (2011)- mild ventilary defect  . Calcification of left breast   . Cancer ([place]) 2012   Breast  . Diabetes mellitus   . Dysrhythmia    palpatations  . Fatty liver 2013   enlarged  . HSV (herpes simplex virus) infection   . Hyperlipidemia   . Hypertension   . 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 Lung nodule seen on imaging study 2013  . Lymphedema    Left arm  . Shortness of breath    with exertion  . Sleep apnea    Stop Bang score of 5. Pt has had sleep study, but was shown to be negative for sleep apnea.  . Sliding hiatal hernia   . 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 ([place]); Essential hypertension, benign; DM (diabetes mellitus) ([place]); OA (osteoarthritis); Morbidly obese ([place]); Asthma; Hyperlipidemia; Chronic fatigue; OSA (obstructive sleep apnea); Dyspnea; HSV (herpes simplex virus) infection; Peripheral edema; Pain in joint, shoulder region; Rotator cuff syndrome of left shoulder; Muscle weakness (generalized); Occupational therapy encounter; Hip pain; Insomnia; Pain, joint, multiple sites; CKD (chronic kidney disease), stage II; Diabetic neuropathy ([place]); Pre-ulcerative calluses; Pelvic pain; Bladder prolapse, female, acquired; Endometrial polyp; Rectocele; Atypical ductal hyperplasia of left breast; Palpitations; Post-operative state; Skin irritation; Elevated LFTs; Leg cramps; Ganglion cyst of flexor tendon sheath of finger  of left hand; Lymphedema; Fatty liver disease, nonalcoholic; and Primary osteoarthritis of first carpometacarpal joint of left hand on her problem list.     is allergic to arimidex [anastrozole]; adhesive [tape]; lisinopril; and tetracyclines & related.  Julie Clay had no medications administered during this visit.  Past Surgical History:  Procedure Laterality Date  . BREAST BIOPSY  06/22/13  . BREAST BIOPSY Left 09/12/2013    Procedure: BREAST BIOPSY WITH NEEDLE LOCALIZATION;  Surgeon: Jamesetta So, MD;  Location: [facility];  Service: General;  Laterality: Left;  . BREAST SURGERY  jan 2013  . CATARACT EXTRACTION, BILATERAL     on different occassions   . CESAREAN SECTION    . HAND LIGAMENT RECONSTRUCTION Right   . HYSTEROSCOPY W/D&C N/A 11/06/2013   Procedure: DILATATION AND CURETTAGE /HYSTEROSCOPY;  Surgeon: Jonnie Kind, MD;  Location: [facility];  Service: Gynecology;  Laterality: N/A;  . LUNG LOBECTOMY Right 1988   Fungal Infection  . POLYPECTOMY N/A 11/06/2013   Procedure: POLYPECTOMY (REMOVAL ENDOMETRIAL POLYP);  Surgeon: Jonnie Kind, MD;  Location: [facility];  Service: Gynecology;  Laterality: N/A;  . RECTOCELE REPAIR N/A 11/06/2013   Procedure: POSTERIOR REPAIR (RECTOCELE);  Surgeon: Jonnie Kind, MD;  Location: [facility];  Service: Gynecology;  Laterality: N/A;  . SHOULDER ARTHROSCOPY WITH OPEN ROTATOR CUFF REPAIR Left 07/26/2012    Review of Systems  Constitutional: Negative.   HENT: Negative.   Eyes: Negative.   Respiratory: Negative.   Cardiovascular: Negative.   Gastrointestinal: Negative.   Genitourinary: Negative.   Musculoskeletal: Negative.        More frequent breast pain attributed to lymphedema   Skin: Negative.   Neurological: Negative.   Endo/Heme/Allergies: Negative.   Psychiatric/Behavioral: Negative.   All other systems reviewed and are negative.    PHYSICAL EXAMINATION  ECOG PERFORMANCE STATUS: 1 - Symptomatic but completely ambulatory  Vitals:   04/27/16 1108  BP: 136/63  Pulse: 87  Resp: (!) 22  Temp: 97.8 F (36.6 C)    Physical Exam  Constitutional: She is oriented to person, place, and time and well-developed, well-nourished, and in no distress.  Obese  HENT:  Head: Normocephalic and atraumatic.  Mouth/Throat: No oropharyngeal exudate.  Eyes: Conjunctivae and EOM are normal. Pupils are equal, round, and reactive to light. No scleral icterus.  Neck: Normal  range of motion. Neck supple.  Cardiovascular: Normal rate, regular rhythm and normal heart sounds.   Pulmonary/Chest: Effort normal and breath sounds normal.    Abdominal: Soft. Bowel sounds are normal. She exhibits no distension and no mass. There is no tenderness. There is no rebound and no guarding.  Musculoskeletal: Normal range of motion.  Lymphadenopathy:    She has no cervical adenopathy.  Neurological: She is alert and oriented to person, place, and time. Gait normal.  Skin: Skin is warm and dry.  Psychiatric: Mood, memory, affect and judgment normal.  Nursing note and vitals reviewed.    LABORATORY DATA: I have reviewed the data as listed. CBC    Component Value Date/Time   WBC 8.1 01/30/2016 1022   RBC 4.72 01/30/2016 1022   HGB 13.0 01/30/2016 1022   HCT 39.7 01/30/2016 1022   PLT 225 01/30/2016 1022   MCV 84.1 01/30/2016 1022   MCH 27.5 01/30/2016 1022   MCHC 32.7 01/30/2016 1022   RDW 14.1 01/30/2016 1022   LYMPHSABS 2,268 01/30/2016 1022   MONOABS 648 01/30/2016 1022   EOSABS 243 01/30/2016 1022   BASOSABS 0  01/30/2016 1022      Chemistry      Component Value Date/Time   NA 141 01/30/2016 1022   K 4.3 01/30/2016 1022   CL 103 01/30/2016 1022   CO2 28 01/30/2016 1022   BUN 24 01/30/2016 1022   CREATININE 1.13 (H) 01/30/2016 1022      Component Value Date/Time   CALCIUM 9.2 01/30/2016 1022   ALKPHOS 38 12/08/2015 1125   AST 45 (H) 12/08/2015 1125   ALT 40 (H) 12/08/2015 1125   BILITOT 0.4 12/08/2015 1125     RADIOLOGY: I have personally reviewed the radiological images as listed and agreed with the findings in the report. Study Result   CLINICAL DATA:  Tailbone and posterior bilateral hip pain.  EXAM: DG HIP (WITH OR WITHOUT PELVIS) 5+V BILAT  COMPARISON:  None.  FINDINGS: Mild symmetric degenerative changes in the hips bilaterally with joint space narrowing and spurring. SI joints are symmetric and unremarkable. No acute bony  abnormality. Specifically, no fracture, subluxation, or dislocation. Soft tissues are intact.  IMPRESSION: No acute bony abnormality.   Electronically Signed   By: Rolm Baptise M.D.   On: 01/30/2016 12:55   Study Result   CLINICAL DATA:  Tailbone and bilateral posterior hip and pelvic pain.  EXAM: SACRUM AND COCCYX - 2+ VIEW  COMPARISON:  None.  FINDINGS: No focal sacrococcygeal abnormality or fracture visualized. SI joints are symmetric and unremarkable. Mild sclerosis at the pubic symphysis.  IMPRESSION: No acute bony abnormality.   Electronically Signed   By: Rolm Baptise M.D.   On: 01/30/2016 12:54   Study Result   CLINICAL DATA:  Positive TB test  EXAM: CHEST  2 VIEW  COMPARISON:  05/08/2012  FINDINGS: Cardiomediastinal silhouette is stable. No acute infiltrate or pleural effusion. No pulmonary edema. Stable pleural thickening and scarring in right midlung. Mild elevation of of the right hemidiaphragm again noted. Osteopenia and mild degenerative change thoracic spine pre  IMPRESSION: No active disease. Stable pleural thickening and scarring in right midlung.   Electronically Signed   By: Lahoma Crocker M.D.   On: 01/30/2016 13:08      ASSESSMENT:  1. Left-sided stage I cancer the breast infiltrating ductal type ER positive, PR positive, HER-2/neu nonamplified with an Oncotype DX score 0.  Started on Arimidex in Feb or March 2013 but developed intolerance to this medication.  She was therefore switched to Tamoxifen in Jan 2014.  She will take anti-endocrine therapy for 5 years.  She declined radiation therapy at the time of presentation in December 2012.  2.  Left breast atypical lobular hyperplasia, S/P lumpectomy by Dr. Arnoldo Morale on 09/12/2013 3. Left full-thickness supraspinatus tear with mild partial-thickness superior infraspinatus tear requiring arthroscopic intervention in April 2014  4. Multiple left breast biopsies (March 2015  and May 2015).Negative work-up. 5. Elevated LFT's  Patient Active Problem List   Diagnosis Date Noted  . Primary osteoarthritis of first carpometacarpal joint of left hand 06/12/2015  . Fatty liver disease, nonalcoholic 61/44/3154  . Lymphedema 02/18/2015  . Leg cramps 08/02/2014  . Ganglion cyst of flexor tendon sheath of finger of left hand 08/02/2014  . Elevated LFTs 04/28/2014  . Skin irritation 01/19/2014  . Post-operative state 11/14/2013  . Palpitations 10/12/2013  . Atypical ductal hyperplasia of left breast 07/12/2013  . Endometrial polyp 06/25/2013  . Rectocele 06/25/2013  . Diabetic neuropathy ([place]) 05/09/2013  . Pre-ulcerative calluses 05/09/2013  . Pelvic pain 05/09/2013  . Bladder prolapse, female, acquired 05/09/2013  .  Pain, joint, multiple sites 03/26/2013  . 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 Insomnia 11/29/2012  . Hip pain 09/08/2012  . Occupational therapy encounter 08/30/2012  . Muscle weakness (generalized) 06/26/2012  . Rotator cuff syndrome of left shoulder 06/21/2012  . Pain in joint, shoulder region 06/16/2012  . Peripheral edema 02/04/2012  . HSV (herpes simplex virus) infection 12/09/2011  . Dyspnea 11/15/2011  . 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 Essential hypertension, benign 10/11/2011  . DM (diabetes mellitus) ([place]) 10/11/2011  . OA (osteoarthritis) 10/11/2011  . Morbidly obese ([place]) 10/11/2011  . Asthma 10/11/2011  . Hyperlipidemia 10/11/2011  . Chronic fatigue 10/11/2011  . OSA (obstructive sleep apnea) 10/11/2011    PLAN:  She will continue with her Tamoxifen daily. She is up to date on screening mammography.Last mammogram on 06/24/2015. She takes vitamin D but not calcium. I have reordered her mammogram for March of this year.   She follows with her primary care physician as required. She has minimal lymphedema on exam today. She should continue wearing her sleeve and with massage  as instructed.  Ongoing weight loss was encouraged.   She will return in 6 months with repeat Physical Exam. She does not need any refills.  THERAPY PLAN:  NCCN guidelines recommends the following surveillance for invasive breast cancer:  A. History and Physical exam every 4-6 months for 5 years and then every 12 months.  B. Mammography every 12 months  C. Women on Tamoxifen: annual gynecologic assessment every 12 months if uterus is present.  D. Women on aromatase inhibitor or who experience ovarian failure secondary to treatment should have monitoring of bone health with a bone mineral density determination at baseline and periodically thereafter.  E. Assess and encourage adherence to adjuvant endocrine therapy.  F. Evidence suggests that active lifestyle and achieving and maintaining an ideal body weight (20-25 BMI) may lead to optimal breast cancer outcomes.    All questions were answered. The patient knows to call the clinic with any problems, questions or concerns. We can certainly see the patient much sooner if necessary.  This document serves as a record of services personally performed by Ancil Linsey, MD. It was created on her behalf by Shirlean Mylar, a trained medical scribe. The creation of this record is based on the scribe's personal observations and the provider's statements to them. This document has been checked and approved by the attending provider. I have reviewed the above documentation for accuracy and completeness, and I agree with the above.  This document was electronically signed. Molli Hazard, MD

## 2016-04-27 NOTE — Patient Instructions (Signed)
[place] at [facility] Discharge Instructions  RECOMMENDATIONS MADE BY THE CONSULTANT AND ANY TEST RESULTS WILL BE SENT TO YOUR REFERRING PHYSICIAN.  You were seen today by Dr. Whitney Muse. Get your mammogram in March. Return in 6 months for follow up.   Thank you for choosing [place] at [facility] to provide your oncology and hematology care.  To afford each patient quality time with our provider, please arrive at least 15 minutes before your scheduled appointment time.    If you have a lab appointment with the [facility] please come in thru the  Main Entrance and check in at the main information desk  You need to re-schedule your appointment should you arrive 10 or more minutes late.  We strive to give you quality time with our providers, and arriving late affects you and other patients whose appointments are after yours.  Also, if you no show three or more times for appointments you may be dismissed from the clinic at the providers discretion.     Again, thank you for choosing [facility].  Our hope is that these requests will decrease the amount of time that you wait before being seen by our physicians.       _____________________________________________________________  Should you have questions after your visit to [facility], please contact our office at [phone] between the hours of 8:30 a.m. and 4:30 p.m.  Voicemails left after 4:30 p.m. will not be returned until the following business day.  For prescription refill requests, have your pharmacy contact our office.       Resources For Cancer Patients and their Caregivers ? American Cancer Society: Can assist with transportation, wigs, general needs, runs Look Good Feel Better.        ([phone] ? Cancer Care: Provides financial assistance, online support groups, medication/co-pay assistance.  1-800-813-HOPE 478-394-5985) ? [place] Assists [place] cancer patients and their families through emotional , educational and financial support.  508-698-0838 ? [place] DSS Where to apply for food stamps, Medicaid and utility assistance. 603-252-2653 ? [facility]: Transportation to medical appointments. 228-258-4076 ? Social Security Administration: May apply for disability if have a Stage IV cancer. 469-388-3999 220-757-5862 ? [employer], Disability and Transit Services: Assists with nutrition, care and transit needs. [place] Support Programs: '@10RELATIVEDAYS'$ @ > Cancer Support Group  2nd Tuesday of the month 1pm-2pm, Journey Room  > Creative Journey  3rd Tuesday of the month 1130am-1pm, Journey Room  > Look Good Feel Better  1st Wednesday of the month 10am-12 noon, Journey Room (Call [facility] to register [phone])

## 2016-04-29 ENCOUNTER — Encounter (HOSPITAL_COMMUNITY): Payer: Self-pay | Admitting: Hematology & Oncology

## 2016-05-20 DIAGNOSIS — H04123 Dry eye syndrome of bilateral lacrimal glands: Secondary | ICD-10-CM | POA: Diagnosis not present

## 2016-05-20 DIAGNOSIS — E119 Type 2 diabetes mellitus without complications: Secondary | ICD-10-CM | POA: Diagnosis not present

## 2016-05-20 LAB — HM DIABETES EYE EXAM

## 2016-05-26 ENCOUNTER — Telehealth (INDEPENDENT_AMBULATORY_CARE_PROVIDER_SITE_OTHER): Payer: Self-pay | Admitting: Internal Medicine

## 2016-05-26 NOTE — Telephone Encounter (Signed)
Patient called, she has an appointment on 06/07/16, she wanted to know if it is necessary for her to keep this appointment.  [phone]

## 2016-05-27 ENCOUNTER — Encounter: Payer: Self-pay | Admitting: Family Medicine

## 2016-05-27 NOTE — Telephone Encounter (Signed)
She can come in about 2 months later.

## 2016-05-28 DIAGNOSIS — M25551 Pain in right hip: Secondary | ICD-10-CM | POA: Diagnosis not present

## 2016-05-28 DIAGNOSIS — M25561 Pain in right knee: Secondary | ICD-10-CM | POA: Diagnosis not present

## 2016-05-31 ENCOUNTER — Ambulatory Visit (INDEPENDENT_AMBULATORY_CARE_PROVIDER_SITE_OTHER): Payer: Medicare Other | Admitting: Family Medicine

## 2016-05-31 ENCOUNTER — Other Ambulatory Visit: Payer: Self-pay | Admitting: Family Medicine

## 2016-05-31 ENCOUNTER — Encounter: Payer: Self-pay | Admitting: Family Medicine

## 2016-05-31 VITALS — BP 124/68 | HR 84 | Temp 98.6°F | Resp 16 | Ht 65.0 in | Wt 230.0 lb

## 2016-05-31 DIAGNOSIS — E785 Hyperlipidemia, unspecified: Secondary | ICD-10-CM

## 2016-05-31 DIAGNOSIS — E1143 Type 2 diabetes mellitus with diabetic autonomic (poly)neuropathy: Secondary | ICD-10-CM

## 2016-05-31 DIAGNOSIS — N182 Chronic kidney disease, stage 2 (mild): Secondary | ICD-10-CM | POA: Diagnosis not present

## 2016-05-31 DIAGNOSIS — E119 Type 2 diabetes mellitus without complications: Secondary | ICD-10-CM | POA: Diagnosis not present

## 2016-05-31 DIAGNOSIS — I10 Essential (primary) hypertension: Secondary | ICD-10-CM

## 2016-05-31 DIAGNOSIS — K760 Fatty (change of) liver, not elsewhere classified: Secondary | ICD-10-CM

## 2016-05-31 DIAGNOSIS — R011 Cardiac murmur, unspecified: Secondary | ICD-10-CM

## 2016-05-31 DIAGNOSIS — E782 Mixed hyperlipidemia: Secondary | ICD-10-CM

## 2016-05-31 DIAGNOSIS — E1141 Type 2 diabetes mellitus with diabetic mononeuropathy: Secondary | ICD-10-CM

## 2016-05-31 LAB — CBC WITH DIFFERENTIAL/PLATELET
Basophils Absolute: 0 cells/uL (ref 0–200)
Basophils Relative: 0 %
EOS PCT: 1 %
Eosinophils Absolute: 94 cells/uL (ref 15–500)
HCT: 39.6 % (ref 35.0–45.0)
Hemoglobin: 12.9 g/dL (ref 12.0–15.0)
LYMPHS PCT: 34 %
Lymphs Abs: 3196 cells/uL (ref 850–3900)
MCH: 27.7 pg (ref 27.0–33.0)
MCHC: 32.6 g/dL (ref 32.0–36.0)
MCV: 85 fL (ref 80.0–100.0)
MONOS PCT: 8 %
MPV: 10.5 fL (ref 7.5–12.5)
Monocytes Absolute: 752 cells/uL (ref 200–950)
Neutro Abs: 5358 cells/uL (ref 1500–7800)
Neutrophils Relative %: 57 %
PLATELETS: 213 10*3/uL (ref 140–400)
RBC: 4.66 MIL/uL (ref 3.80–5.10)
RDW: 13.9 % (ref 11.0–15.0)
WBC: 9.4 10*3/uL (ref 3.8–10.8)

## 2016-05-31 LAB — COMPLETE METABOLIC PANEL WITH GFR
ALT: 52 U/L — AB (ref 6–29)
AST: 61 U/L — ABNORMAL HIGH (ref 10–35)
Albumin: 3.6 g/dL (ref 3.6–5.1)
Alkaline Phosphatase: 36 U/L (ref 33–130)
BILIRUBIN TOTAL: 0.4 mg/dL (ref 0.2–1.2)
BUN: 29 mg/dL — ABNORMAL HIGH (ref 7–25)
CALCIUM: 9.3 mg/dL (ref 8.6–10.4)
CHLORIDE: 106 mmol/L (ref 98–110)
CO2: 28 mmol/L (ref 20–31)
CREATININE: 1.01 mg/dL — AB (ref 0.60–0.93)
GFR, EST AFRICAN AMERICAN: 64 mL/min (ref >=60)
GFR, Est Non African American: 55 mL/min — ABNORMAL LOW (ref >=60)
Glucose, Bld: 100 mg/dL — ABNORMAL HIGH (ref 70–99)
Potassium: 4.4 mmol/L (ref 3.5–5.3)
Sodium: 143 mmol/L (ref 135–146)
Total Protein: 6 g/dL — ABNORMAL LOW (ref 6.1–8.1)

## 2016-05-31 LAB — LIPID PANEL
CHOLESTEROL: 129 mg/dL (ref <200)
HDL: 46 mg/dL — AB (ref >50)
LDL Cholesterol: 49 mg/dL (ref <100)
TRIGLYCERIDES: 170 mg/dL — AB (ref <150)
Total CHOL/HDL Ratio: 2.8 Ratio (ref <5.0)
VLDL: 34 mg/dL — ABNORMAL HIGH (ref <30)

## 2016-05-31 LAB — HEMOGLOBIN A1C
Hgb A1c MFr Bld: 7 % — ABNORMAL HIGH (ref <5.7)
Mean Plasma Glucose: 154 mg/dL

## 2016-05-31 NOTE — Telephone Encounter (Signed)
I called the patient and left her a message that I was cancelling the 06/07/16 appointment and to call me and we'll get her scheduled to come in to see Terri in April.

## 2016-05-31 NOTE — Assessment & Plan Note (Signed)
Controlled no changes 

## 2016-05-31 NOTE — Progress Notes (Signed)
   Subjective:    Patient ID: Julie Clay, female    DOB: 03/24/1941, 76 y.o.   MRN: [id number]  Patient presents for 4 month F/U (had labs drawn on 2/12 in [place])  Patient here for follow-up on chronic medical problems. Diabetes mellitus. Last A1c was 7.1% which was an improvement. She is on metformin/glipizide/januvia  as well as well as statin drug and ACE inhibitor her blood sugars at home range Hypertension she is taking her medications as prescribed difficulties with her blood pressure Lipidemia she is currently on Crestor her last LDL was 54 with a mildly elevated triglyceride at 153 in May 2017  Due for urine micro     Review Of Systems:  GEN- denies fatigue, fever, weight loss,weakness, recent illness HEENT- denies eye drainage, change in vision, nasal discharge, CVS- denies chest pain, palpitations RESP- denies SOB, cough, wheeze ABD- denies N/V, change in stools, abd pain GU- denies dysuria, hematuria, dribbling, incontinence MSK- denies joint pain, muscle aches, injury Neuro- denies headache, dizziness, syncope, seizure activity       Objective:    BP 124/68   Pulse 84   Temp 98.6 F (37 C) (Oral)   Resp 16   Ht '5\' 5"'$  (1.651 m)   Wt 230 lb (104.3 kg)   SpO2 98%   BMI 38.27 kg/m  GEN- NAD, alert and oriented x3 HEENT- PERRL, EOMI, non injected sclera, pink conjunctiva, MMM, oropharynx clear Neck- Supple, no thyromegaly CVS- RRR, 2/6 SEM RESP-CTAB EXT- No edema Pulses- Radial, DP- 2+        Assessment & Plan:      Problem List Items Addressed This Visit    Hyperlipidemia    On statin, followed by GI for fatty liver and elevated LFT       Heart murmur   Fatty liver disease, nonalcoholic   Essential hypertension, benign - Primary    Controlled no changes       DM (diabetes mellitus) (HCC)    Controlled, check A1C, on statin , ACEI Check urine micro Continue current meds       Relevant Orders   Microalbumin / creatinine urine ratio    Diabetic neuropathy (HCC)   CKD (chronic kidney disease), stage II    Check renal function       Relevant Orders   Microalbumin / creatinine urine ratio    Other Visit Diagnoses    HLD (hyperlipidemia)       Diabetes mellitus without complication ([place])          Note: This dictation was prepared with Dragon dictation along with smaller phrase technology. Any transcriptional errors that result from this process are unintentional.

## 2016-05-31 NOTE — Patient Instructions (Signed)
F/U 4 months  We will call with lab results  

## 2016-05-31 NOTE — Assessment & Plan Note (Signed)
Controlled, check A1C, on statin , ACEI Check urine micro Continue current meds

## 2016-05-31 NOTE — Assessment & Plan Note (Signed)
Check renal function. 

## 2016-05-31 NOTE — Assessment & Plan Note (Signed)
On statin, followed by GI for fatty liver and elevated LFT

## 2016-06-01 ENCOUNTER — Other Ambulatory Visit: Payer: Self-pay | Admitting: *Deleted

## 2016-06-01 LAB — MICROALBUMIN / CREATININE URINE RATIO
Creatinine, Urine: 70 mg/dL (ref 20–320)
Microalb Creat Ratio: 4 mcg/mg creat (ref <30)
Microalb, Ur: 0.3 mg/dL

## 2016-06-01 MED ORDER — PREDNISONE 20 MG PO TABS: 20.0000 mg | ORAL_TABLET | Freq: Every day | ORAL | 0 refills | Status: DC

## 2016-06-02 ENCOUNTER — Telehealth: Payer: Self-pay | Admitting: Family Medicine

## 2016-06-02 ENCOUNTER — Other Ambulatory Visit: Payer: Self-pay | Admitting: *Deleted

## 2016-06-02 MED ORDER — ALBUTEROL SULFATE HFA 108 (90 BASE) MCG/ACT IN AERS: 2.0000 | INHALATION_SPRAY | RESPIRATORY_TRACT | 6 refills | Status: DC | PRN

## 2016-06-04 ENCOUNTER — Telehealth: Payer: Self-pay | Admitting: Family Medicine

## 2016-06-04 ENCOUNTER — Telehealth: Payer: Self-pay | Admitting: *Deleted

## 2016-06-04 MED ORDER — AZITHROMYCIN 250 MG PO TABS: ORAL_TABLET | ORAL | 0 refills | Status: DC

## 2016-06-04 NOTE — Telephone Encounter (Signed)
Patient saw dr Buelah Manis on 02/12, she said dr Buelah Manis told her she could not have antibiotic until Friday, she is still feeling horrible and would like antibiotic  [phone]

## 2016-06-04 NOTE — Telephone Encounter (Signed)
Notes Recorded by Alycia Rossetti, MD on 06/01/2016 at 8:54 PM EST If not better by Friday or fever, worsening symptoms send zpak ------  Notes Recorded by Eden Lathe Giavanni Odonovan, LPN on 6/85/9923 at 4:14 PM EST Call placed to patient and patient made aware.   Patient reports that she has been experiencing slight wheezing and non-productive cough x2 days. Advised to use OTC Robitussin for cough and nasal saline for congestion. Advised to increase rest and to increase fluid intake, and to use Albuterol neb tx as prescribed. Patient requesting ABTx. MD made aware and advised to continue Sx management. NO for Prednisone 20 QD x5. Prescription sent to pharmacy. Patient will call back at the end of the week if no better. ------

## 2016-06-04 NOTE — Telephone Encounter (Signed)
Received call from patient.   Reports that cough has not improved. Prescription sent to pharmacy for Z-Pack.   Advised if Sx do not improve after ABTx, schedule OV for eval.

## 2016-06-04 NOTE — Telephone Encounter (Signed)
Please see prior message.   

## 2016-06-07 ENCOUNTER — Ambulatory Visit (INDEPENDENT_AMBULATORY_CARE_PROVIDER_SITE_OTHER): Payer: Medicare Other | Admitting: Internal Medicine

## 2016-06-08 NOTE — Telephone Encounter (Signed)
Received call from patient.   Reports that she completed ABTx in AM of 06/08/2016. Reports that she is feeling marginally improved, but continues to have increased sinus pressure/ pain and slight wheeze.   Advised to continue Sx management. Advised Sx can take up to 1 week to resolve. Advised if fever, Sx worsen to contact office for visit.

## 2016-06-09 ENCOUNTER — Ambulatory Visit (INDEPENDENT_AMBULATORY_CARE_PROVIDER_SITE_OTHER): Payer: Medicare Other | Admitting: Internal Medicine

## 2016-06-14 ENCOUNTER — Other Ambulatory Visit (HOSPITAL_COMMUNITY): Payer: Self-pay | Admitting: Oncology

## 2016-06-14 DIAGNOSIS — Z9889 Other specified postprocedural states: Secondary | ICD-10-CM

## 2016-06-29 ENCOUNTER — Encounter (HOSPITAL_COMMUNITY): Payer: Medicare Other

## 2016-07-03 ENCOUNTER — Other Ambulatory Visit: Payer: Self-pay | Admitting: Family Medicine

## 2016-07-13 ENCOUNTER — Other Ambulatory Visit (HOSPITAL_COMMUNITY): Payer: Self-pay | Admitting: Oncology

## 2016-07-13 ENCOUNTER — Ambulatory Visit (HOSPITAL_COMMUNITY)
Admission: RE | Admit: 2016-07-13 | Discharge: 2016-07-13 | Disposition: A | Discharge status: Home / Self Care | Payer: Medicare Other | Source: Ambulatory Visit | Attending: Oncology | Admitting: Oncology

## 2016-07-13 ENCOUNTER — Ambulatory Visit (HOSPITAL_COMMUNITY)
Admission: RE | Admit: 2016-07-13 | Discharge: 2016-07-13 | Disposition: A | Discharge status: Home / Self Care | Payer: Medicare Other | Source: Ambulatory Visit | Attending: Hematology & Oncology | Admitting: Hematology & Oncology

## 2016-07-13 DIAGNOSIS — Z9889 Other specified postprocedural states: Secondary | ICD-10-CM

## 2016-07-13 DIAGNOSIS — C50912 Malignant neoplasm of unspecified site of left female breast: Secondary | ICD-10-CM

## 2016-07-13 DIAGNOSIS — Z853 Personal history of malignant neoplasm of breast: Secondary | ICD-10-CM | POA: Diagnosis not present

## 2016-07-13 DIAGNOSIS — N6489 Other specified disorders of breast: Secondary | ICD-10-CM | POA: Diagnosis not present

## 2016-07-13 DIAGNOSIS — R928 Other abnormal and inconclusive findings on diagnostic imaging of breast: Secondary | ICD-10-CM | POA: Diagnosis not present

## 2016-07-19 ENCOUNTER — Other Ambulatory Visit: Payer: Self-pay | Admitting: Family Medicine

## 2016-07-19 NOTE — Telephone Encounter (Signed)
Ok to refill??      LOV 05/31/2016  LRF 04/16/16 #90/0

## 2016-07-19 NOTE — Telephone Encounter (Signed)
Okay to refill? 

## 2016-07-26 ENCOUNTER — Encounter (INDEPENDENT_AMBULATORY_CARE_PROVIDER_SITE_OTHER): Payer: Self-pay | Admitting: Internal Medicine

## 2016-07-26 ENCOUNTER — Encounter (INDEPENDENT_AMBULATORY_CARE_PROVIDER_SITE_OTHER): Payer: Self-pay | Admitting: *Deleted

## 2016-07-26 ENCOUNTER — Ambulatory Visit (INDEPENDENT_AMBULATORY_CARE_PROVIDER_SITE_OTHER): Payer: Medicare Other | Admitting: Internal Medicine

## 2016-07-26 VITALS — BP 132/80 | HR 64 | Temp 98.0°F | Ht 65.0 in | Wt 228.0 lb

## 2016-07-26 DIAGNOSIS — K760 Fatty (change of) liver, not elsewhere classified: Secondary | ICD-10-CM | POA: Diagnosis not present

## 2016-07-26 DIAGNOSIS — R748 Abnormal levels of other serum enzymes: Secondary | ICD-10-CM

## 2016-07-26 NOTE — Patient Instructions (Signed)
OV in 6 months. 

## 2016-07-26 NOTE — Progress Notes (Signed)
Subjective:    Patient ID: GENISE STRACK, female    DOB: Sep 08, 1940, 76 y.o.   MRN: [id number]  HPI Here today for f/u. She was last seen in August. Hx of elevated liver enzymes and fatty liver. She is doing good. She just had a mammogram  and she reports it was normal. Hx of breast cancer in 2012.  Appetite is good. She has lost 4 pounds since her last visit. She has a BM daily. No melena or BRRB.    Hepatic Function Latest Ref Rng & Units 05/31/2016 12/08/2015 09/11/2015  Total Protein 6.1 - 8.1 g/dL 6.0(L) 6.3 6.0(L)  Albumin 3.6 - 5.1 g/dL 3.6 3.8 3.5(L)  AST 10 - 35 U/L 61(H) 45(H) 37(H)  ALT 6 - 29 U/L 52(H) 40(H) 31(H)  Alk Phosphatase 33 - 130 U/L 36 38 35  Total Bilirubin 0.2 - 1.2 mg/dL 0.4 0.4 0.4  Bilirubin, Direct <=0.2 mg/dL - 0.1 -    12/04/2014 Hepatitis markers were normal. CRP, ,ANA,SMA, mitochondrial were normal.  Ferritin 75, alpha 1 antitrypsin 212.    CBC    Component Value Date/Time   WBC 9.4 05/31/2016 0911   RBC 4.66 05/31/2016 0911   HGB 12.9 05/31/2016 0911   HCT 39.6 05/31/2016 0911   PLT 213 05/31/2016 0911   MCV 85.0 05/31/2016 0911   MCH 27.7 05/31/2016 0911   MCHC 32.6 05/31/2016 0911   RDW 13.9 05/31/2016 0911   LYMPHSABS 3,196 05/31/2016 0911   MONOABS 752 05/31/2016 0911   EOSABS 94 05/31/2016 0911   BASOSABS 0 05/31/2016 0911       11/14/2014 US abdomen: elevated liver enzymes, Hx of breast cancer:   IMPRESSION: 1. Echogenic inhomogeneous liver parenchyma consistent with fatty infiltration. No focal hepatic abnormality is seen. 2. No gallstones. 3. Hepatomegaly. 4. Some of the anatomy particularly the pancreas and abdominal aorta are obscured by bowel gas and body habitus.     Review of Systems Past Medical History:  Diagnosis Date  . Arthritis    Osteoarthritis  . Arthritis   . Asthma    spirometry (2011)- mild ventilary defect  . Calcification of left breast   . Cancer ([place]) 2012   Breast  . Diabetes  mellitus   . Dysrhythmia    palpatations  . Fatty liver 2013   enlarged  . HSV (herpes simplex virus) infection   . Hyperlipidemia   . Hypertension   . 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 Lung nodule seen on imaging study 2013  . Lymphedema    Left arm  . Shortness of breath    with exertion  . Sleep apnea    Stop Bang score of 5. Pt has had sleep study, but was shown to be negative for sleep apnea.  . Sliding hiatal hernia   . Ventral hernia, unspecified, without mention of obstruction or gangrene     Past Surgical History:  Procedure Laterality Date  . BREAST BIOPSY  06/22/13  . BREAST BIOPSY Left 09/12/2013   Procedure: BREAST BIOPSY WITH NEEDLE LOCALIZATION;  Surgeon: Jamesetta So, MD;  Location: [facility];  Service: General;  Laterality: Left;  . BREAST SURGERY  jan 2013  . CATARACT EXTRACTION, BILATERAL     on different occassions   . CESAREAN SECTION    . HAND LIGAMENT RECONSTRUCTION Right   . HYSTEROSCOPY W/D&C N/A 11/06/2013   Procedure: DILATATION AND CURETTAGE /HYSTEROSCOPY;  Surgeon: Jonnie Kind, MD;  Location: [facility];  Service: Gynecology;  Laterality: N/A;  . LUNG LOBECTOMY Right 1988   Fungal Infection  . POLYPECTOMY N/A 11/06/2013   Procedure: POLYPECTOMY (REMOVAL ENDOMETRIAL POLYP);  Surgeon: Jonnie Kind, MD;  Location: [facility];  Service: Gynecology;  Laterality: N/A;  . RECTOCELE REPAIR N/A 11/06/2013   Procedure: POSTERIOR REPAIR (RECTOCELE);  Surgeon: Jonnie Kind, MD;  Location: [facility];  Service: Gynecology;  Laterality: N/A;  . SHOULDER ARTHROSCOPY WITH OPEN ROTATOR CUFF REPAIR Left 07/26/2012    Allergies  Allergen Reactions  . Arimidex [Anastrozole] Other (See Comments)    Arthralgias and myalgias.  Improved with 3 week hiatus from drug.  Categorical side effect of drug class.  . Adhesive [Tape] Other (See Comments)    "red and blistered"  . Lisinopril     Dry hacking cough and tickle  . Tetracyclines &  Related Other (See Comments)    unknown    Current Outpatient Prescriptions on File Prior to Visit  Medication Sig Dispense Refill  . acetaminophen (TYLENOL) 500 MG tablet Take 500 mg by mouth as needed for moderate pain. Takes 2 when needed    . albuterol (PROVENTIL HFA;VENTOLIN HFA) 108 (90 Base) MCG/ACT inhaler Inhale 2 puffs into the lungs every 4 (four) hours as needed. 6.7 g 6  . Aloe Vera Leaf POWD Apply 800 mg topically 2 (two) times daily.     . Ascorbic Acid (VITAMIN C) 1000 MG tablet Take 1,000 mg by mouth 2 (two) times daily.    Marland Kitchen b complex vitamins tablet Take 1 tablet by mouth 2 (two) times daily.     . Cholecalciferol (VITAMIN D3) 2000 UNITS TABS Take 2,000 Units by mouth 2 (two) times daily.    Mariane Baumgarten Calcium (STOOL SOFTENER PO) Take 1 capsule by mouth as needed (constipation).     . Echinacea 380 MG CAPS Take 1 capsule by mouth as needed (cold/flue).    . fluocinonide cream (LIDEX) 6.76 % Apply 1 application topically 2 (two) times daily as needed (poison ivy/bites/hives). 30 g 11  . Ginkgo Biloba 120 MG CAPS Take 1 capsule by mouth daily.     Marland Kitchen glipiZIDE (GLUCOTROL) 10 MG tablet TAKE 1 TABLET BY MOUTH TWICE DAILY BEFORE A MEAL 180 tablet 0  . HYDROcodone-acetaminophen (NORCO) 10-325 MG tablet Take 1 tablet by mouth every 6 (six) hours as needed.    Marland Kitchen KRILL OIL PO Take 1 capsule by mouth 2 (two) times daily.    . Magnesium 400 MG TABS Take 400 mg by mouth daily.    . Melatonin 3 MG TABS Take 3 mg by mouth at bedtime as needed and may repeat dose one time if needed (sleep).     . metFORMIN (GLUCOPHAGE) 1000 MG tablet TAKE 1 TABLET BY MOUTH TWICE DAILY WITH A MEAL 180 tablet 3  . mometasone-formoterol (DULERA) 100-5 MCG/ACT AERO Inhale 2 puffs into the lungs 2 (two) times daily. 13 g 5  . montelukast (SINGULAIR) 10 MG tablet TAKE 1 TABLET BY MOUTH EVERY NIGHT AT BEDTIME 90 tablet 0  . Multiple Vitamin (MULTIVITAMIN WITH MINERALS) TABS tablet Take 1 tablet by mouth daily.      . Naftifine HCl 1 % GEL Apply 1 application topically daily as needed.    . rosuvastatin (CRESTOR) 5 MG tablet TAKE 1 TABLET(5 MG) BY MOUTH DAILY 90 tablet 0  . simethicone (MYLICON) 80 MG chewable tablet Chew 80 mg by mouth as needed for flatulence.     . tamoxifen (NOLVADEX)  20 MG tablet Take 1 tablet (20 mg total) by mouth at bedtime. 30 tablet 5  . tiZANidine (ZANAFLEX) 4 MG tablet TAKE 1 TABLET BY MOUTH EVERY NIGHT AT BEDTIME AS NEEDED FOR MUSCLE SPASMS 90 tablet 0  . triamterene-hydrochlorothiazide (MAXZIDE) 75-50 MG tablet TAKE 1 TABLET BY MOUTH EVERY DAY 90 tablet 0  . Glucose Blood (BLOOD GLUCOSE TEST STRIPS) STRP Please dispense One touch Ultra. Use as directed to monitor FSBS 1x daily. Dx: E11.9. 200 each 3  . JANUVIA 50 MG tablet TAKE 1 TABLET BY MOUTH DAILY 90 tablet 3  . NONFORMULARY OR COMPOUNDED ITEM Apply 1 application topically 2 (two) times daily as needed. Reported on 06/10/2015     No current facility-administered medications on file prior to visit.        Objective:   Physical Exam Blood pressure 132/80, pulse 64, temperature 98 F (36.7 C), height 5' 5" (1.651 m), weight 228 lb (103.4 kg).  Alert and oriented. Skin warm and dry. Oral mucosa is moist.   . Sclera anicteric, conjunctivae is pink. Thyroid not enlarged. No cervical lymphadenopathy. Lungs clear. Heart regular rate and rhythm.  Abdomen is soft. Bowel sounds are positive. No hepatomegaly. No abdominal masses felt. No tenderness.  No edema to lower extremities.  Abdomen is obese.        Assessment & Plan:  Fatty liver, elevated liver enyzmes. Hepatic function, cbc in 3 months. [place] RUQ  OV in 6 months.

## 2016-07-27 ENCOUNTER — Other Ambulatory Visit (INDEPENDENT_AMBULATORY_CARE_PROVIDER_SITE_OTHER): Payer: Self-pay | Admitting: *Deleted

## 2016-07-27 DIAGNOSIS — K760 Fatty (change of) liver, not elsewhere classified: Secondary | ICD-10-CM

## 2016-07-27 DIAGNOSIS — R748 Abnormal levels of other serum enzymes: Secondary | ICD-10-CM

## 2016-08-04 ENCOUNTER — Ambulatory Visit (HOSPITAL_COMMUNITY)
Admission: RE | Admit: 2016-08-04 | Discharge: 2016-08-04 | Disposition: A | Discharge status: Home / Self Care | Payer: Medicare Other | Source: Ambulatory Visit | Attending: Internal Medicine | Admitting: Internal Medicine

## 2016-08-04 DIAGNOSIS — R748 Abnormal levels of other serum enzymes: Secondary | ICD-10-CM

## 2016-08-04 DIAGNOSIS — K760 Fatty (change of) liver, not elsewhere classified: Secondary | ICD-10-CM | POA: Insufficient documentation

## 2016-08-04 DIAGNOSIS — K828 Other specified diseases of gallbladder: Secondary | ICD-10-CM | POA: Diagnosis not present

## 2016-08-25 ENCOUNTER — Other Ambulatory Visit (HOSPITAL_COMMUNITY): Payer: Self-pay | Admitting: Hematology & Oncology

## 2016-08-25 DIAGNOSIS — C50912 Malignant neoplasm of unspecified site of left female breast: Secondary | ICD-10-CM

## 2016-09-09 ENCOUNTER — Ambulatory Visit (INDEPENDENT_AMBULATORY_CARE_PROVIDER_SITE_OTHER): Payer: Medicare Other | Admitting: Physician Assistant

## 2016-09-09 ENCOUNTER — Encounter: Payer: Self-pay | Admitting: Physician Assistant

## 2016-09-09 VITALS — BP 124/74 | HR 92 | Temp 97.7°F | Resp 16 | Wt 227.2 lb

## 2016-09-09 DIAGNOSIS — L255 Unspecified contact dermatitis due to plants, except food: Secondary | ICD-10-CM

## 2016-09-09 MED ORDER — TRIAMCINOLONE ACETONIDE 0.1 % EX CREA: 1.0000 "application " | TOPICAL_CREAM | Freq: Two times a day (BID) | CUTANEOUS | 0 refills | Status: DC

## 2016-09-09 MED ORDER — PREDNISONE 20 MG PO TABS: ORAL_TABLET | ORAL | 0 refills | Status: DC

## 2016-09-09 NOTE — Progress Notes (Signed)
Patient ID: SIRENIA WHITIS MRN: [id number], DOB: [birth year], 76 y.o. Date of Encounter: 09/09/2016, 12:17 PM    Chief Complaint:  Chief Complaint  Patient presents with  . poision ivy both legs  . Rash    on arm and neck      HPI: 76 y.o. year old female presents with above.   States that rash started on dorsum of right foot 2 weeks ago. Realized after the fact that she had come in contact with poison oak/poison ivy. Says that that rash then spread to her left calf. Now recently she has just developed itchy rash on inner crease of right elbow and splotchy area on left upper chest and left neck. All of these areas of rash are very itchy. Has been applying triamcinolone cream.     Home Meds:   Outpatient Medications Prior to Visit  Medication Sig Dispense Refill  . acetaminophen (TYLENOL) 500 MG tablet Take 500 mg by mouth as needed for moderate pain. Takes 2 when needed    . albuterol (PROVENTIL HFA;VENTOLIN HFA) 108 (90 Base) MCG/ACT inhaler Inhale 2 puffs into the lungs every 4 (four) hours as needed. 6.7 g 6  . Aloe Vera Leaf POWD Apply 800 mg topically 2 (two) times daily.     . Ascorbic Acid (VITAMIN C) 1000 MG tablet Take 1,000 mg by mouth 2 (two) times daily.    Marland Kitchen b complex vitamins tablet Take 1 tablet by mouth 2 (two) times daily.     . Cholecalciferol (VITAMIN D3) 2000 UNITS TABS Take 2,000 Units by mouth 2 (two) times daily.    Mariane Baumgarten Calcium (STOOL SOFTENER PO) Take 1 capsule by mouth as needed (constipation).     . Echinacea 380 MG CAPS Take 1 capsule by mouth as needed (cold/flue).    . fluocinonide cream (LIDEX) 5.32 % Apply 1 application topically 2 (two) times daily as needed (poison ivy/bites/hives). 30 g 11  . Ginkgo Biloba 120 MG CAPS Take 1 capsule by mouth daily.     Marland Kitchen glipiZIDE (GLUCOTROL) 10 MG tablet TAKE 1 TABLET BY MOUTH TWICE DAILY BEFORE A MEAL 180 tablet 0  . Glucose Blood (BLOOD GLUCOSE TEST STRIPS) STRP Please dispense One touch Ultra. Use as  directed to monitor FSBS 1x daily. Dx: E11.9. 200 each 3  . HYDROcodone-acetaminophen (NORCO) 10-325 MG tablet Take 1 tablet by mouth every 6 (six) hours as needed.    Marland Kitchen JANUVIA 50 MG tablet TAKE 1 TABLET BY MOUTH DAILY 90 tablet 3  . KRILL OIL PO Take 1 capsule by mouth 2 (two) times daily.    . Magnesium 400 MG TABS Take 400 mg by mouth daily.    . Melatonin 3 MG TABS Take 3 mg by mouth at bedtime as needed and may repeat dose one time if needed (sleep).     . metFORMIN (GLUCOPHAGE) 1000 MG tablet TAKE 1 TABLET BY MOUTH TWICE DAILY WITH A MEAL 180 tablet 3  . mometasone-formoterol (DULERA) 100-5 MCG/ACT AERO Inhale 2 puffs into the lungs 2 (two) times daily. 13 g 5  . montelukast (SINGULAIR) 10 MG tablet TAKE 1 TABLET BY MOUTH EVERY NIGHT AT BEDTIME 90 tablet 0  . Multiple Vitamin (MULTIVITAMIN WITH MINERALS) TABS tablet Take 1 tablet by mouth daily.    . Naftifine HCl 1 % GEL Apply 1 application topically daily as needed.    . NONFORMULARY OR COMPOUNDED ITEM Apply 1 application topically 2 (two) times daily as needed. Reported on  06/10/2015    . rosuvastatin (CRESTOR) 5 MG tablet TAKE 1 TABLET(5 MG) BY MOUTH DAILY 90 tablet 0  . simethicone (MYLICON) 80 MG chewable tablet Chew 80 mg by mouth as needed for flatulence.     . tamoxifen (NOLVADEX) 20 MG tablet TAKE 1 TABLET(20 MG) BY MOUTH AT BEDTIME 30 tablet 5  . tiZANidine (ZANAFLEX) 4 MG tablet TAKE 1 TABLET BY MOUTH EVERY NIGHT AT BEDTIME AS NEEDED FOR MUSCLE SPASMS 90 tablet 0  . triamterene-hydrochlorothiazide (MAXZIDE) 75-50 MG tablet TAKE 1 TABLET BY MOUTH EVERY DAY 90 tablet 0   No facility-administered medications prior to visit.     Allergies:  Allergies  Allergen Reactions  . Arimidex [Anastrozole] Other (See Comments)    Arthralgias and myalgias.  Improved with 3 week hiatus from drug.  Categorical side effect of drug class.  . Adhesive [Tape] Other (See Comments)    "red and blistered"  . Lisinopril     Dry hacking cough and  tickle  . Tetracyclines & Related Other (See Comments)    unknown      Review of Systems: See HPI for pertinent ROS. All other ROS negative.    Physical Exam: Blood pressure 124/74, pulse 92, temperature 97.7 F (36.5 C), temperature source Oral, resp. rate 16, weight 227 lb 3.2 oz (103.1 kg), SpO2 98 %., Body mass index is 37.81 kg/m. General:  Obese WF. Appears in no acute distress. Neck: Supple. No thyromegaly. No lymphadenopathy. Lungs: Clear bilaterally to auscultation without wheezes, rales, or rhonchi. Breathing is unlabored. Heart: Regular rhythm. No murmurs, rubs, or gallops. Msk:  Strength and tone normal for age. Skin: Blotchy area of erythema on dorsum of right foot, left calf, crease of right elbow, left upper chest and left neck. Neuro: Alert and oriented X 3. Moves all extremities spontaneously. Gait is normal. CNII-XII grossly in tact. Psych:  Responds to questions appropriately with a normal affect.     ASSESSMENT AND PLAN:  76 y.o. year old female with  1. 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 I discussed doing injection of prednisone but she states that in the past-- oral taper 60 mg has worked well for this same amount of poison oak poison ivy in the past. She has diabetes. Discussed that we definitely want to use the lowest amount of prednisone possible but at the same time want this to resolve. She is to take oral prednisone taper as directed below. She is to follow-up if all itching and rash do not resolve. Also reminded her that this will make her blood sugar go up higher so be extra careful with her diet while on the prednisone. Follow-up if needed. - predniSONE (DELTASONE) 20 MG tablet; Take 3 daily for 2 days, then 2 daily for 2 days, then 1 daily for 2 days.  Dispense: 12 tablet; Refill: 0   Signed, [address] [place], [place], [facility] 09/09/2016 12:17 PM

## 2016-09-15 ENCOUNTER — Other Ambulatory Visit: Payer: Self-pay | Admitting: *Deleted

## 2016-09-15 MED ORDER — BLOOD GLUCOSE TEST VI STRP: ORAL_STRIP | 3 refills | Status: DC

## 2016-09-20 ENCOUNTER — Other Ambulatory Visit: Payer: Self-pay | Admitting: Family Medicine

## 2016-09-20 DIAGNOSIS — N182 Chronic kidney disease, stage 2 (mild): Secondary | ICD-10-CM | POA: Diagnosis not present

## 2016-09-20 DIAGNOSIS — E785 Hyperlipidemia, unspecified: Secondary | ICD-10-CM | POA: Diagnosis not present

## 2016-09-20 DIAGNOSIS — E119 Type 2 diabetes mellitus without complications: Secondary | ICD-10-CM | POA: Diagnosis not present

## 2016-09-20 DIAGNOSIS — I10 Essential (primary) hypertension: Secondary | ICD-10-CM | POA: Diagnosis not present

## 2016-09-20 LAB — CBC WITH DIFFERENTIAL/PLATELET
BASOS PCT: 0 %
Basophils Absolute: 0 cells/uL (ref 0–200)
EOS ABS: 194 {cells}/uL (ref 15–500)
Eosinophils Relative: 2 %
HEMATOCRIT: 41 % (ref 35.0–45.0)
Hemoglobin: 13.3 g/dL (ref 12.0–15.0)
LYMPHS PCT: 27 %
Lymphs Abs: 2619 cells/uL (ref 850–3900)
MCH: 28 pg (ref 27.0–33.0)
MCHC: 32.4 g/dL (ref 32.0–36.0)
MCV: 86.3 fL (ref 80.0–100.0)
MONO ABS: 679 {cells}/uL (ref 200–950)
MPV: 10.6 fL (ref 7.5–12.5)
Monocytes Relative: 7 %
NEUTROS PCT: 64 %
Neutro Abs: 6208 cells/uL (ref 1500–7800)
Platelets: 207 10*3/uL (ref 140–400)
RBC: 4.75 MIL/uL (ref 3.80–5.10)
RDW: 13.8 % (ref 11.0–15.0)
WBC: 9.7 10*3/uL (ref 3.8–10.8)

## 2016-09-20 LAB — COMPLETE METABOLIC PANEL WITH GFR
ALBUMIN: 3.5 g/dL — AB (ref 3.6–5.1)
ALK PHOS: 37 U/L (ref 33–130)
ALT: 47 U/L — AB (ref 6–29)
AST: 51 U/L — ABNORMAL HIGH (ref 10–35)
BILIRUBIN TOTAL: 0.5 mg/dL (ref 0.2–1.2)
BUN: 20 mg/dL (ref 7–25)
CALCIUM: 9.7 mg/dL (ref 8.6–10.4)
CHLORIDE: 102 mmol/L (ref 98–110)
CO2: 29 mmol/L (ref 20–31)
CREATININE: 1.09 mg/dL — AB (ref 0.60–0.93)
GFR, EST AFRICAN AMERICAN: 58 mL/min — AB (ref >=60)
GFR, Est Non African American: 50 mL/min — ABNORMAL LOW (ref >=60)
Glucose, Bld: 127 mg/dL — ABNORMAL HIGH (ref 70–99)
Potassium: 4.2 mmol/L (ref 3.5–5.3)
Sodium: 140 mmol/L (ref 135–146)
Total Protein: 6.1 g/dL (ref 6.1–8.1)

## 2016-09-20 LAB — LIPID PANEL
CHOLESTEROL: 146 mg/dL (ref <200)
HDL: 44 mg/dL — ABNORMAL LOW (ref >50)
LDL Cholesterol: 59 mg/dL (ref <100)
TRIGLYCERIDES: 214 mg/dL — AB (ref <150)
Total CHOL/HDL Ratio: 3.3 Ratio (ref <5.0)
VLDL: 43 mg/dL — AB (ref <30)

## 2016-09-20 NOTE — Telephone Encounter (Signed)
Refill appropriate 

## 2016-09-21 ENCOUNTER — Other Ambulatory Visit: Payer: Self-pay | Admitting: Physician Assistant

## 2016-09-21 ENCOUNTER — Telehealth: Payer: Self-pay | Admitting: Family Medicine

## 2016-09-21 DIAGNOSIS — L255 Unspecified contact dermatitis due to plants, except food: Secondary | ICD-10-CM

## 2016-09-21 LAB — HEMOGLOBIN A1C
Hgb A1c MFr Bld: 7.2 % — ABNORMAL HIGH (ref <5.7)
Mean Plasma Glucose: 160 mg/dL

## 2016-09-21 NOTE — Telephone Encounter (Signed)
Pt called [facility] that she is having another flare and would like to have her Prednisone refilled. OK to refill?

## 2016-09-21 NOTE — Telephone Encounter (Signed)
Medication called/sent to requested pharmacy and pt aware and is having another flare of poison oak

## 2016-09-21 NOTE — Telephone Encounter (Signed)
Okay to refill? 

## 2016-09-28 ENCOUNTER — Encounter: Payer: Self-pay | Admitting: Family Medicine

## 2016-09-28 ENCOUNTER — Ambulatory Visit (INDEPENDENT_AMBULATORY_CARE_PROVIDER_SITE_OTHER): Payer: Medicare Other | Admitting: Family Medicine

## 2016-09-28 VITALS — BP 122/70 | HR 74 | Temp 98.3°F | Resp 14 | Ht 65.0 in | Wt 227.0 lb

## 2016-09-28 DIAGNOSIS — N182 Chronic kidney disease, stage 2 (mild): Secondary | ICD-10-CM | POA: Diagnosis not present

## 2016-09-28 DIAGNOSIS — K760 Fatty (change of) liver, not elsewhere classified: Secondary | ICD-10-CM | POA: Diagnosis not present

## 2016-09-28 DIAGNOSIS — E1143 Type 2 diabetes mellitus with diabetic autonomic (poly)neuropathy: Secondary | ICD-10-CM

## 2016-09-28 DIAGNOSIS — E782 Mixed hyperlipidemia: Secondary | ICD-10-CM

## 2016-09-28 DIAGNOSIS — I10 Essential (primary) hypertension: Secondary | ICD-10-CM | POA: Diagnosis not present

## 2016-09-28 DIAGNOSIS — E1141 Type 2 diabetes mellitus with diabetic mononeuropathy: Secondary | ICD-10-CM | POA: Diagnosis not present

## 2016-09-28 NOTE — Assessment & Plan Note (Signed)
A1c is at goal for her know change her medications. Discussed trying to get some activity even if it is short distances with walking. Continue current regimen.

## 2016-09-28 NOTE — Patient Instructions (Addendum)
F/U 4 months Physical  

## 2016-09-28 NOTE — Assessment & Plan Note (Signed)
His gastritis changed to increase her red meat and some fats in her diet. Her triglycerides have gone up. She has known fatty liver disease.

## 2016-09-28 NOTE — Assessment & Plan Note (Signed)
Blood pressure is well controlled 

## 2016-09-28 NOTE — Assessment & Plan Note (Signed)
Her LFTs in general didn't look better. She is following up with GI

## 2016-09-28 NOTE — Assessment & Plan Note (Signed)
Renal function is at baseline no changes.

## 2016-09-28 NOTE — Progress Notes (Signed)
   Subjective:    Patient ID: Julie Clay, female    DOB: [birth year], 76 y.o.   MRN: [id number]  Patient presents for Follow-up (is not fasting)  Pt here to f/u chronic medical problems   DM- A1C 7.2% ,cbg are good, did not Bring her meter no hypoglycemia symptoms.  Hyperlipidemia- high TG with known Fatty liver followed by GI, TG worse than previous up 44 pts On Crestor   weight down 3lbs  Hypertension- taking BP meds as prescribed no SE with medications, no concerns   No new concerns today    She is not very active. States that her hip pain has improved but she still gets discomfort if she walks too long.  She is seen her oncologist next month and they plan to stop the tamoxifen. Review Of Systems:  GEN- denies fatigue, fever, weight loss,weakness, recent illness HEENT- denies eye drainage, change in vision, nasal discharge, CVS- denies chest pain, palpitations RESP- denies SOB, cough, wheeze ABD- denies N/V, change in stools, abd pain GU- denies dysuria, hematuria, dribbling, incontinence MSK- denies joint pain, muscle aches, injury Neuro- denies headache, dizziness, syncope, seizure activity       Objective:    BP 122/70   Pulse 74   Temp 98.3 F (36.8 C) (Oral)   Resp 14   Ht 5\' 5"  (1.651 m)   Wt 227 lb (103 kg)   SpO2 98%   BMI 37.77 kg/m  GEN- NAD, alert and oriented x3 HEENT- PERRL, EOMI, non injected sclera, pink conjunctiva, MMM, oropharynx clear Neck- Supple, no thyromegaly CVS- RRR, no murmur RESP-CTAB ABD-NABS,soft,NT,ND EXT- No edema Pulses- Radial, DP- 2+        Assessment & Plan:      Problem List Items Addressed This Visit    Morbidly obese (HCC)   Diabetic neuropathy ([place])   Hyperlipidemia    His gastritis changed to increase her red meat and some fats in her diet. Her triglycerides have gone up. She has known fatty liver disease.      Fatty liver disease, nonalcoholic    Her LFTs in general didn't look better. She is following up  with GI      Essential hypertension, benign    Blood pressure is well controlled      DM (diabetes mellitus) ([place]) - Primary    A1c is at goal for her know change her medications. Discussed trying to get some activity even if it is short distances with walking. Continue current regimen.      CKD (chronic kidney disease), stage II    Renal function is at baseline no changes.         Note: This dictation was prepared with Dragon dictation along with smaller phrase technology. Any transcriptional errors that result from this process are unintentional.

## 2016-10-04 ENCOUNTER — Other Ambulatory Visit: Payer: Self-pay | Admitting: Family Medicine

## 2016-10-13 ENCOUNTER — Other Ambulatory Visit: Payer: Self-pay | Admitting: Family Medicine

## 2016-10-14 NOTE — Telephone Encounter (Signed)
Okay to refill? 

## 2016-10-14 NOTE — Telephone Encounter (Signed)
Ok to refill??      LOV - 09/18/16 LRF - 07/19/16 #90/0

## 2016-10-15 ENCOUNTER — Encounter: Payer: Self-pay | Admitting: Family Medicine

## 2016-10-15 NOTE — Progress Notes (Signed)
  Patient here with her husband for his physical exam. States that she's been having some dizzy episodes when she stands up or bends over she feels lightheaded. Orthostatics were done. Her lying blood pressure was 128/68 sitting 132/64 standing 118/60. I recommend that she decrease to one half tablet of her Maxide was see how her blood pressure doesn't her symptoms she will call me in 2 weeks.

## 2016-10-22 ENCOUNTER — Telehealth: Payer: Self-pay | Admitting: *Deleted

## 2016-10-22 DIAGNOSIS — M25561 Pain in right knee: Secondary | ICD-10-CM | POA: Diagnosis not present

## 2016-10-22 NOTE — Telephone Encounter (Signed)
Received call from patient.   States that she has been taking half of her Maxide daily. Reports that she continues to have some lightheadedness and she has noted her BP running higher. Also reports that she has gained 6lbs since decreasing her Maxide.   MD please advise.

## 2016-10-22 NOTE — Telephone Encounter (Signed)
Call placed to patient and patient made aware.   Reports that she does not want to try the Meclizine at this time.

## 2016-10-22 NOTE — Telephone Encounter (Signed)
Go ahead and go back to the full dose, since there was no real change in her symptoms Check her blood pressure twice a day and send me results Drink plenty of fluids to keep her kidneys hydrated with increasing back on the diuretic  You can give Meclizine 12.5mg  po BID prn dizziness #20 tabs

## 2016-10-25 ENCOUNTER — Encounter (HOSPITAL_COMMUNITY): Payer: Medicare Other | Attending: Hematology & Oncology | Admitting: Oncology

## 2016-10-25 ENCOUNTER — Ambulatory Visit (HOSPITAL_COMMUNITY): Payer: Medicare Other | Admitting: Hematology & Oncology

## 2016-10-25 ENCOUNTER — Other Ambulatory Visit: Payer: Self-pay | Admitting: Family Medicine

## 2016-10-25 ENCOUNTER — Ambulatory Visit (HOSPITAL_COMMUNITY): Payer: Medicare Other

## 2016-10-25 VITALS — BP 157/60 | HR 99 | Temp 98.4°F | Resp 14 | Wt 230.1 lb

## 2016-10-25 DIAGNOSIS — I890 Lymphedema, not elsewhere classified: Secondary | ICD-10-CM | POA: Diagnosis not present

## 2016-10-25 DIAGNOSIS — Z853 Personal history of malignant neoplasm of breast: Secondary | ICD-10-CM | POA: Diagnosis not present

## 2016-10-25 DIAGNOSIS — C50912 Malignant neoplasm of unspecified site of left female breast: Secondary | ICD-10-CM

## 2016-10-25 NOTE — Progress Notes (Signed)
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  D/C'd due to arthralgias and myalgias      04/19/2012 -  Chemotherapy    Tamoxifen started       CURRENT THERAPY: Initially started on Arimidex in Feb/March 2013, but developed side effects and switched to Tamoxifen in Jan 2014.   INTERVAL HISTORY: Julie Clay 76 y.o. 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  Started on Arimidex in Feb or March 2013 but developed intolerance to this medication.  She was therefore switched to Tamoxifen in Jan 2014.  She will take anti-endocrine therapy for 5 years.  She declined radiation therapy at the time of presentation in December 2012. She attributes her oncotype score to seeing a naturopathic physician at the time of her cancer diagnosis. She was given mushroom supplements, and they suggested to cut out white flour and eat a cup of berries each day.  Julie Clay is accompanied by her husband.  She is still on Tamoxifen without issue, but asks when she can stop. She states she has hot flashes related to her diabetes.   She continues to have lymphedema in her left arm which causes swelling, but her husband massages her left arm for her. Overall she feels well and has no complaints.  Her last mammogram with [place] on 07/13/16 was birads 3. She denies palpating any new breast masses.  Past Medical History:  Diagnosis Date  . Arthritis    Osteoarthritis  . Arthritis   . Asthma    spirometry (2011)- mild ventilary defect  . Calcification of left breast   . Cancer ([place]) 2012   Breast  . Diabetes mellitus   . Dysrhythmia    palpatations  . Fatty liver 2013   enlarged  . HSV (herpes simplex virus) infection   . Hyperlipidemia   . Hypertension   . 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 Lung nodule seen on imaging study 2013  . Lymphedema    Left arm  . Shortness of breath    with exertion  . Sleep apnea    Stop Bang score of 5. Pt has had sleep study, but was shown to be negative for sleep apnea.  . Sliding hiatal hernia   . 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 ([place]); Essential hypertension, benign; DM (diabetes mellitus) ([place]); OA (osteoarthritis); Morbidly obese ([place]); Asthma; Hyperlipidemia; Chronic fatigue; OSA (obstructive sleep apnea); Dyspnea; HSV (herpes simplex virus) infection; Peripheral edema; Pain in joint, shoulder region; Rotator cuff syndrome of left shoulder; Muscle weakness (generalized); Occupational therapy encounter; Hip pain; Insomnia; Pain, joint, multiple sites; CKD (chronic kidney disease), stage II; Diabetic neuropathy ([place]); Pre-ulcerative calluses; Pelvic pain; Bladder prolapse, female, acquired; Endometrial polyp; Rectocele; Atypical ductal hyperplasia of left breast; Palpitations; Post-operative state; Skin irritation; Elevated LFTs; Leg cramps; Ganglion cyst of flexor tendon sheath of finger of left hand; Lymphedema; Fatty liver disease, nonalcoholic; Primary osteoarthritis of first carpometacarpal joint of left hand; and Heart murmur on her problem list.  is allergic to arimidex [anastrozole]; adhesive [tape]; lisinopril; and tetracyclines & related.  Julie Clay had no medications administered during this visit.  Past Surgical History:  Procedure Laterality Date  . BREAST BIOPSY  06/22/13  . BREAST BIOPSY Left 09/12/2013   Procedure: BREAST BIOPSY WITH NEEDLE LOCALIZATION;  Surgeon: Jamesetta So, MD;  Location: [facility];  Service: General;  Laterality:  Left;  . BREAST SURGERY  jan 2013  . CATARACT EXTRACTION, BILATERAL     on different occassions   . CESAREAN SECTION    . HAND LIGAMENT RECONSTRUCTION Right   . HYSTEROSCOPY W/D&C N/A 11/06/2013   Procedure: DILATATION AND CURETTAGE /HYSTEROSCOPY;  Surgeon: Jonnie Kind, MD;  Location: [facility];  Service: Gynecology;  Laterality: N/A;  . LUNG LOBECTOMY Right 1988   Fungal Infection  . POLYPECTOMY N/A 11/06/2013   Procedure: POLYPECTOMY (REMOVAL ENDOMETRIAL POLYP);  Surgeon: Jonnie Kind, MD;  Location: [facility];  Service: Gynecology;  Laterality: N/A;  . RECTOCELE REPAIR N/A 11/06/2013   Procedure: POSTERIOR REPAIR (RECTOCELE);  Surgeon: Jonnie Kind, MD;  Location: [facility];  Service: Gynecology;  Laterality: N/A;  . SHOULDER ARTHROSCOPY WITH OPEN ROTATOR CUFF REPAIR Left 07/26/2012    Review of Systems  Constitutional: Negative.   HENT: Negative.   Eyes: Negative.   Respiratory: Negative.   Cardiovascular: Negative.   Gastrointestinal: Negative.   Genitourinary: Negative.   Musculoskeletal: Negative.        More frequent breast pain attributed to lymphedema   Skin: Negative.   Neurological: Negative.   Endo/Heme/Allergies: Negative.   Psychiatric/Behavioral: Negative.   All other systems reviewed and are negative.    PHYSICAL EXAMINATION  ECOG PERFORMANCE STATUS: 1 - Symptomatic but completely ambulatory  Vitals:   10/25/16 1108  BP: (!) 157/60  Pulse: 99  Resp: 14  Temp: 98.4 F (36.9 C)    Physical Exam  Constitutional: She is oriented to person, place, and time and well-developed, well-nourished, and in no distress.  Obese  HENT:  Head: Normocephalic and atraumatic.  Mouth/Throat: No oropharyngeal exudate.  Eyes: Conjunctivae and EOM are normal. Pupils are equal, round, and reactive to light. No scleral icterus.  Neck: Normal range of motion. Neck supple.  Cardiovascular: Normal rate, regular rhythm and normal heart sounds.   Pulmonary/Chest: Effort normal  and breath sounds normal.    Abdominal: Soft. Bowel sounds are normal. She exhibits no distension and no mass. There is no tenderness. There is no rebound and no guarding.  Musculoskeletal: Normal range of motion.  Lymphadenopathy:    She has no cervical adenopathy.  Neurological: She is alert and oriented to person, place, and time. Gait normal.  Skin: Skin is warm and dry.  Psychiatric: Mood, memory, affect and judgment normal.  Nursing note and vitals reviewed.    LABORATORY DATA: I have reviewed the data as listed. CBC    Component Value Date/Time   WBC 9.7 09/20/2016 1015   RBC 4.75 09/20/2016 1015   HGB 13.3 09/20/2016 1015   HCT 41.0 09/20/2016 1015   PLT 207 09/20/2016 1015   MCV 86.3 09/20/2016 1015   MCH 28.0 09/20/2016 1015   MCHC 32.4 09/20/2016 1015   RDW 13.8 09/20/2016 1015   LYMPHSABS 2,619 09/20/2016 1015   MONOABS 679 09/20/2016 1015   EOSABS 194 09/20/2016 1015   BASOSABS 0 09/20/2016 1015      Chemistry      Component Value Date/Time   NA 140 09/20/2016 1015   K 4.2  09/20/2016 1015   CL 102 09/20/2016 1015   CO2 29 09/20/2016 1015   BUN 20 09/20/2016 1015   CREATININE 1.09 (H) 09/20/2016 1015      Component Value Date/Time   CALCIUM 9.7 09/20/2016 1015   ALKPHOS 37 09/20/2016 1015   AST 51 (H) 09/20/2016 1015   ALT 47 (H) 09/20/2016 1015   BILITOT 0.5 09/20/2016 1015     RADIOLOGY: I have personally reviewed the radiological images as listed and agreed with the findings in the report. Study Result   CLINICAL DATA:  Tailbone and posterior bilateral hip pain.  EXAM: DG HIP (WITH OR WITHOUT PELVIS) 5+V BILAT  COMPARISON:  None.  FINDINGS: Mild symmetric degenerative changes in the hips bilaterally with joint space narrowing and spurring. SI joints are symmetric and unremarkable. No acute bony abnormality. Specifically, no fracture, subluxation, or dislocation. Soft tissues are intact.  IMPRESSION: No acute bony  abnormality.   Electronically Signed   By: Rolm Baptise M.D.   On: 01/30/2016 12:55   Study Result   CLINICAL DATA:  Tailbone and bilateral posterior hip and pelvic pain.  EXAM: SACRUM AND COCCYX - 2+ VIEW  COMPARISON:  None.  FINDINGS: No focal sacrococcygeal abnormality or fracture visualized. SI joints are symmetric and unremarkable. Mild sclerosis at the pubic symphysis.  IMPRESSION: No acute bony abnormality.   Electronically Signed   By: Rolm Baptise M.D.   On: 01/30/2016 12:54   Study Result   CLINICAL DATA:  Positive TB test  EXAM: CHEST  2 VIEW  COMPARISON:  05/08/2012  FINDINGS: Cardiomediastinal silhouette is stable. No acute infiltrate or pleural effusion. No pulmonary edema. Stable pleural thickening and scarring in right midlung. Mild elevation of of the right hemidiaphragm again noted. Osteopenia and mild degenerative change thoracic spine pre  IMPRESSION: No active disease. Stable pleural thickening and scarring in right midlung.   Electronically Signed   By: Lahoma Crocker M.D.   On: 01/30/2016 13:08      ASSESSMENT:  1. Left-sided stage I cancer the breast infiltrating ductal type ER positive, PR positive, HER-2/neu nonamplified with an Oncotype DX score 0.  Started on Arimidex in Feb or March 2013 but developed intolerance to this medication.  She was therefore switched to Tamoxifen in Jan 2014.  She will take anti-endocrine therapy for 5 years.  She declined radiation therapy at the time of presentation in December 2012.  2.  Left breast atypical lobular hyperplasia, S/P lumpectomy by Dr. Arnoldo Morale on 09/12/2013 3. Left full-thickness supraspinatus tear with mild partial-thickness superior infraspinatus tear requiring arthroscopic intervention in April 2014  4. Multiple left breast biopsies (March 2015 and May 2015).Negative work-up. 5. Elevated LFT's  Patient Active Problem List   Diagnosis Date Noted  . Heart murmur  05/31/2016  . Primary osteoarthritis of first carpometacarpal joint of left hand 06/12/2015  . Fatty liver disease, nonalcoholic 35/00/9381  . Lymphedema 02/18/2015  . Leg cramps 08/02/2014  . Ganglion cyst of flexor tendon sheath of finger of left hand 08/02/2014  . Elevated LFTs 04/28/2014  . Skin irritation 01/19/2014  . Post-operative state 11/14/2013  . Palpitations 10/12/2013  . Atypical ductal hyperplasia of left breast 07/12/2013  . Endometrial polyp 06/25/2013  . Rectocele 06/25/2013  . Diabetic neuropathy ([place]) 05/09/2013  . Pre-ulcerative calluses 05/09/2013  . Pelvic pain 05/09/2013  . Bladder prolapse, female, acquired 05/09/2013  . Pain, joint, multiple sites 03/26/2013  . 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 Insomnia 11/29/2012  .  Hip pain 09/08/2012  . Occupational therapy encounter 08/30/2012  . Muscle weakness (generalized) 06/26/2012  . Rotator cuff syndrome of left shoulder 06/21/2012  . Pain in joint, shoulder region 06/16/2012  . Peripheral edema 02/04/2012  . HSV (herpes simplex virus) infection 12/09/2011  . Dyspnea 11/15/2011  . 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 Essential hypertension, benign 10/11/2011  . DM (diabetes mellitus) ([place]) 10/11/2011  . OA (osteoarthritis) 10/11/2011  . Morbidly obese ([place]) 10/11/2011  . Asthma 10/11/2011  . Hyperlipidemia 10/11/2011  . Chronic fatigue 10/11/2011  . OSA (obstructive sleep apnea) 10/11/2011    PLAN:  Clinically NED on breast exam today.  She has completed 5 years of endocrine therapy. Therefore I told her she can stop the tamoxifen. Repeat diagnostic mammography of the right breast in 6 months with right breast ultrasound (september 2018). RTC in 1 year for follow up, sooner should she palpated any breast masses.  THERAPY PLAN:  NCCN guidelines recommends the following surveillance for invasive breast cancer:  A. History and Physical exam every 4-6  months for 5 years and then every 12 months.  B. Mammography every 12 months  C. Women on Tamoxifen: annual gynecologic assessment every 12 months if uterus is present.  D. Women on aromatase inhibitor or who experience ovarian failure secondary to treatment should have monitoring of bone health with a bone mineral density determination at baseline and periodically thereafter.  E. Assess and encourage adherence to adjuvant endocrine therapy.  F. Evidence suggests that active lifestyle and achieving and maintaining an ideal body weight (20-25 BMI) may lead to optimal breast cancer outcomes.    All questions were answered. The patient knows to call the clinic with any problems, questions or concerns. We can certainly see the patient much sooner if necessary.  This document was electronically signed. Twana First, MD

## 2016-11-09 ENCOUNTER — Telehealth: Payer: Self-pay | Admitting: *Deleted

## 2016-11-09 NOTE — Telephone Encounter (Signed)
Received BP readings from patient.   MD reviewed and states that BP are WNL. Continue current medication plan.   Call placed to patient and patient spouse Phebe Colla made aware.

## 2016-11-12 ENCOUNTER — Other Ambulatory Visit: Payer: Self-pay | Admitting: Family Medicine

## 2016-11-15 ENCOUNTER — Other Ambulatory Visit (INDEPENDENT_AMBULATORY_CARE_PROVIDER_SITE_OTHER): Payer: Self-pay | Admitting: *Deleted

## 2016-11-15 ENCOUNTER — Encounter (INDEPENDENT_AMBULATORY_CARE_PROVIDER_SITE_OTHER): Payer: Self-pay | Admitting: *Deleted

## 2016-11-15 DIAGNOSIS — K760 Fatty (change of) liver, not elsewhere classified: Secondary | ICD-10-CM

## 2016-11-15 DIAGNOSIS — R748 Abnormal levels of other serum enzymes: Secondary | ICD-10-CM

## 2016-11-26 ENCOUNTER — Encounter: Payer: Self-pay | Admitting: Family Medicine

## 2016-11-26 ENCOUNTER — Ambulatory Visit (INDEPENDENT_AMBULATORY_CARE_PROVIDER_SITE_OTHER): Payer: Medicare Other | Admitting: Family Medicine

## 2016-11-26 VITALS — BP 142/68 | HR 86 | Temp 98.6°F | Resp 16 | Ht 65.0 in | Wt 226.0 lb

## 2016-11-26 DIAGNOSIS — M25512 Pain in left shoulder: Secondary | ICD-10-CM

## 2016-11-26 DIAGNOSIS — E1143 Type 2 diabetes mellitus with diabetic autonomic (poly)neuropathy: Secondary | ICD-10-CM | POA: Diagnosis not present

## 2016-11-26 DIAGNOSIS — Z9889 Other specified postprocedural states: Secondary | ICD-10-CM

## 2016-11-26 NOTE — Progress Notes (Signed)
   Subjective:    Patient ID: Julie Clay, female    DOB: 03-27-1941, 76 y.o.   MRN: [id number]  Patient presents for Referral (wants referral to [facility] for shoulders- L worse and has been operated on before, R is painful)  Pt here for shoulder pain which is chronic. She has been operated on a few years ago by [facility]. She actuially wants to be referred back but to speak to one of the non surgical physicians such as Dr. Lynann Bologna or Dr. Alfonso Ramus whom she has also seen to get their opinion.  No reinjury, but she does crochet a lot and has to use her arm to help pull herself up out of bed    Review Of Systems:  GEN- denies fatigue, fever, weight loss,weakness, recent illness HEENT- denies eye drainage, change in vision, nasal discharge, CVS- denies chest pain, palpitations RESP- denies SOB, cough, wheeze ABD- denies N/V, change in stools, abd pain GU- denies dysuria, hematuria, dribbling, incontinence MSK-+ joint pain, muscle aches, injury Neuro- denies headache, dizziness, syncope, seizure activity       Objective:    BP (!) 142/68   Pulse 86   Temp 98.6 F (37 C) (Oral)   Resp 16   Ht 5\' 5"  (3.354 m)   Wt 226 lb (102.5 kg)   SpO2 98%   BMI 37.61 kg/m  GEN- NAD, alert and oriented x3 MSK- fair ROM UE, lymphedema Left arm       Assessment & Plan:      Problem List Items Addressed This Visit    Pain in joint, shoulder region - Primary    persistant pain in left shoulder despite surgival intervention She wants to return to same orthopedic but speak to one of the non surgical physicians about options for her shoulder pain She declines any pain meds, take OTC meds rarely      DM (diabetes mellitus) ([place])    End of visit asked about Trulicty, her T6Y is currently 7.2% which is good control for her age Her Januvia however cost her about $40 a month, will check into which is cheaper for her          Note: This dictation was prepared with Dragon dictation  along with smaller phrase technology. Any transcriptional errors that result from this process are unintentional.

## 2016-11-26 NOTE — Patient Instructions (Signed)
Referral to [facility] for consultation  F/u as previous

## 2016-11-27 ENCOUNTER — Encounter: Payer: Self-pay | Admitting: Family Medicine

## 2016-11-27 NOTE — Assessment & Plan Note (Signed)
persistant pain in left shoulder despite surgival intervention She wants to return to same orthopedic but speak to one of the non surgical physicians about options for her shoulder pain She declines any pain meds, take OTC meds rarely

## 2016-11-27 NOTE — Assessment & Plan Note (Signed)
End of visit asked about Trulicty, her O6V is currently 7.2% which is good control for her age Her Januvia however cost her about $40 a month, will check into which is cheaper for her

## 2016-12-06 ENCOUNTER — Telehealth: Payer: Self-pay | Admitting: *Deleted

## 2016-12-06 MED ORDER — DULAGLUTIDE 0.75 MG/0.5ML ~~LOC~~ SOAJ: SUBCUTANEOUS | 3 refills | Status: DC

## 2016-12-06 NOTE — Telephone Encounter (Signed)
-----   Message from Alycia Rossetti, MD sent at 12/03/2016  5:08 PM EDT ----- Regarding: RE: Januvia cost Let pt know, she can try the Trulicity. It is cheaper than the [place], if she does not mind an injection.  If she switches, send me CBG readings in 2 weeks   Trulicity 0.75mg  q weekly ----- Message ----- From: Sheral Flow, LPN Sent: 6/76/7209  11:26 AM To: Alycia Rossetti, MD Subject: RE: Celesta Gentile cost                               Call placed to pharmacy to inquire.   Patient has $45 preferred brand name co-pay. Any oral -gliptin will be $45/ month.   Dummy Claim submitted: Trulicity 0.75mg  $25/ month Victoza 1.2mg - 1.8mg  $39.99/ month  ----- Message ----- From: Alycia Rossetti, MD Sent: 12/02/2016 To: Alycia Rossetti, MD, [facility] Tarvis Blossom, LPN Subject: Januvia cost                                     Januvia cost her $40 a month is this a deductible or tier problem, is Trulicity or vicotza cheaper?

## 2016-12-06 NOTE — Telephone Encounter (Signed)
noted 

## 2016-12-06 NOTE — Telephone Encounter (Signed)
Call placed to patient and patient made aware.   Patient states that she will continue Januvia until she has exhausted current supply. She will then switch to Trulicity. Prescription sent to pharmacy.

## 2016-12-07 DIAGNOSIS — M25512 Pain in left shoulder: Secondary | ICD-10-CM | POA: Diagnosis not present

## 2016-12-07 DIAGNOSIS — M25511 Pain in right shoulder: Secondary | ICD-10-CM | POA: Diagnosis not present

## 2016-12-10 DIAGNOSIS — M25512 Pain in left shoulder: Secondary | ICD-10-CM | POA: Diagnosis not present

## 2016-12-10 DIAGNOSIS — S46012A Strain of muscle(s) and tendon(s) of the rotator cuff of left shoulder, initial encounter: Secondary | ICD-10-CM | POA: Diagnosis not present

## 2016-12-10 DIAGNOSIS — Z9889 Other specified postprocedural states: Secondary | ICD-10-CM | POA: Diagnosis not present

## 2016-12-10 DIAGNOSIS — M62512 Muscle wasting and atrophy, not elsewhere classified, left shoulder: Secondary | ICD-10-CM | POA: Diagnosis not present

## 2017-01-08 ENCOUNTER — Other Ambulatory Visit: Payer: Self-pay | Admitting: Family Medicine

## 2017-01-10 NOTE — Telephone Encounter (Signed)
Medication refilled per protocol. 

## 2017-01-14 ENCOUNTER — Encounter: Payer: Self-pay | Admitting: Family Medicine

## 2017-01-14 ENCOUNTER — Ambulatory Visit (INDEPENDENT_AMBULATORY_CARE_PROVIDER_SITE_OTHER): Payer: Medicare Other | Admitting: Family Medicine

## 2017-01-14 ENCOUNTER — Other Ambulatory Visit: Payer: Self-pay | Admitting: Family Medicine

## 2017-01-14 VITALS — BP 140/72 | HR 95 | Temp 98.2°F | Resp 14 | Ht 65.0 in | Wt 224.8 lb

## 2017-01-14 DIAGNOSIS — E782 Mixed hyperlipidemia: Secondary | ICD-10-CM

## 2017-01-14 DIAGNOSIS — Z23 Encounter for immunization: Secondary | ICD-10-CM | POA: Diagnosis not present

## 2017-01-14 DIAGNOSIS — R309 Painful micturition, unspecified: Secondary | ICD-10-CM | POA: Diagnosis not present

## 2017-01-14 DIAGNOSIS — E1143 Type 2 diabetes mellitus with diabetic autonomic (poly)neuropathy: Secondary | ICD-10-CM

## 2017-01-14 DIAGNOSIS — I10 Essential (primary) hypertension: Secondary | ICD-10-CM

## 2017-01-14 LAB — URINALYSIS, ROUTINE W REFLEX MICROSCOPIC
BILIRUBIN URINE: NEGATIVE
Glucose, UA: NEGATIVE
Hgb urine dipstick: NEGATIVE
Ketones, ur: NEGATIVE
Nitrite: NEGATIVE
RBC / HPF: NONE SEEN /HPF (ref 0–2)
SPECIFIC GRAVITY, URINE: 1.015 (ref 1.001–1.03)
pH: 7 (ref 5.0–8.0)

## 2017-01-14 LAB — MICROSCOPIC MESSAGE

## 2017-01-14 NOTE — Patient Instructions (Addendum)
Move Physical to November  Cancel GI appointment  I will call with urine culture

## 2017-01-14 NOTE — Progress Notes (Signed)
   Subjective:    Patient ID: Julie Clay, female    DOB: 09/09/1940, 76 y.o.   MRN: [id number]  Patient presents for PAINFUL BLADDER Here with discomfort in her lower abdomen and states that she would get a tingling sensation every now and then within the past couple days felt it a few times a day only last for couple seconds near her bladder. No pain when she actually urinated no blood in the stool she has been taking a stool softener to help move her bowels which helps some. States that it may not be anything she does want to get it checked. She does not have the appetite change no recent illness  Needs labs for her upcoming appointment printed  Review Of Systems:  GEN- denies fatigue, fever, weight loss,weakness, recent illness HEENT- denies eye drainage, change in vision, nasal discharge, CVS- denies chest pain, palpitations RESP- denies SOB, cough, wheeze ABD- denies N/V, change in stools, abd pain GU- denies dysuria, hematuria, dribbling, incontinence MSK- denies joint pain, muscle aches, injury Neuro- denies headache, dizziness, syncope, seizure activity       Objective:    BP 140/72   Pulse 95   Temp 98.2 F (36.8 C) (Oral)   Resp 14   Ht 5\' 5"  (1.651 m)   Wt 224 lb 12.8 oz (102 kg)   SpO2 98%   BMI 37.41 kg/m  GEN- NAD, alert and oriented x3 CVS- RRR, no murmur RESP-CTAB ABD-NABS,soft,NT,ND, no CVA tenderness  Pulses- Radial 2+        Assessment & Plan:      Problem List Items Addressed This Visit      Unprioritized   Hyperlipidemia   Relevant Orders   Lipid panel   Essential hypertension, benign   DM (diabetes mellitus) ([place])   Relevant Orders   CBC with Differential/Platelet   Comprehensive metabolic panel   Hemoglobin A1c    Other Visit Diagnoses    Painful urination    -  Primary   Not classic of UTI, advised to try extra stool softner or laxative to move bowels, UA does not shw UTI, Culture sent treat if positive    Relevant Orders   Urinalysis, Routine w reflex microscopic (Completed)   Urine Culture (Completed)   Flu vaccine need       Relevant Orders   Flu Vaccine QUAD 36+ mos IM (Completed)      Note: This dictation was prepared with Dragon dictation along with smaller phrase technology. Any transcriptional errors that result from this process are unintentional.

## 2017-01-15 LAB — URINE CULTURE
MICRO NUMBER: 81079601
Result:: NO GROWTH
SPECIMEN QUALITY: ADEQUATE

## 2017-01-16 ENCOUNTER — Encounter: Payer: Self-pay | Admitting: Family Medicine

## 2017-01-18 ENCOUNTER — Encounter (HOSPITAL_COMMUNITY): Payer: Medicare Other

## 2017-01-18 ENCOUNTER — Telehealth: Payer: Self-pay | Admitting: Family Medicine

## 2017-01-18 NOTE — Telephone Encounter (Signed)
Pt called and states that after getting the flu shot she started getting sick with sinus drainage that is green and yellow with facial pain and running a low grade fever 99.2. Would like to know if you would call her in something???

## 2017-01-19 MED ORDER — AMOXICILLIN 500 MG PO CAPS: 500.0000 mg | ORAL_CAPSULE | Freq: Two times a day (BID) | ORAL | 0 refills | Status: DC

## 2017-01-19 NOTE — Telephone Encounter (Signed)
Use nasal saline, fever reducer Amoxicillin 500mg  BID x 7 days

## 2017-01-19 NOTE — Telephone Encounter (Signed)
Medication called/sent to requested pharmacy and pt aware 

## 2017-01-21 ENCOUNTER — Telehealth: Payer: Self-pay | Admitting: *Deleted

## 2017-01-21 NOTE — Telephone Encounter (Signed)
Received letter from [place].   Reports that [facility] [id number]/ Lot [id number]) has been recalled D/T manufacturing error. 90 count bottle of Losartan was noted in shipment.   Call placed to patient to make aware. Spoke with patient spouse, Phebe Colla. Advised to take prescription bottle to pharmacy to verify medication is correct. Verbalized understanding.

## 2017-01-25 ENCOUNTER — Ambulatory Visit (INDEPENDENT_AMBULATORY_CARE_PROVIDER_SITE_OTHER): Payer: Medicare Other | Admitting: Internal Medicine

## 2017-01-28 ENCOUNTER — Encounter: Payer: Medicare Other | Admitting: Family Medicine

## 2017-01-31 ENCOUNTER — Other Ambulatory Visit: Payer: Self-pay | Admitting: Family Medicine

## 2017-02-01 DIAGNOSIS — M47816 Spondylosis without myelopathy or radiculopathy, lumbar region: Secondary | ICD-10-CM | POA: Diagnosis not present

## 2017-02-01 DIAGNOSIS — M25511 Pain in right shoulder: Secondary | ICD-10-CM | POA: Diagnosis not present

## 2017-02-08 ENCOUNTER — Ambulatory Visit (HOSPITAL_COMMUNITY)
Admission: RE | Admit: 2017-02-08 | Discharge: 2017-02-08 | Disposition: A | Discharge status: Home / Self Care | Payer: Medicare Other | Source: Ambulatory Visit | Attending: Oncology | Admitting: Oncology

## 2017-02-08 ENCOUNTER — Encounter (HOSPITAL_COMMUNITY): Payer: Self-pay

## 2017-02-08 DIAGNOSIS — Z853 Personal history of malignant neoplasm of breast: Secondary | ICD-10-CM | POA: Diagnosis present

## 2017-02-08 DIAGNOSIS — R928 Other abnormal and inconclusive findings on diagnostic imaging of breast: Secondary | ICD-10-CM | POA: Insufficient documentation

## 2017-02-08 DIAGNOSIS — N6312 Unspecified lump in the right breast, upper inner quadrant: Secondary | ICD-10-CM | POA: Insufficient documentation

## 2017-02-08 DIAGNOSIS — N6489 Other specified disorders of breast: Secondary | ICD-10-CM | POA: Diagnosis not present

## 2017-02-08 DIAGNOSIS — C50912 Malignant neoplasm of unspecified site of left female breast: Secondary | ICD-10-CM

## 2017-02-09 ENCOUNTER — Other Ambulatory Visit (HOSPITAL_COMMUNITY): Payer: Self-pay | Admitting: Adult Health

## 2017-02-09 DIAGNOSIS — C50912 Malignant neoplasm of unspecified site of left female breast: Secondary | ICD-10-CM

## 2017-02-09 DIAGNOSIS — N6310 Unspecified lump in the right breast, unspecified quadrant: Secondary | ICD-10-CM

## 2017-03-01 DIAGNOSIS — M25511 Pain in right shoulder: Secondary | ICD-10-CM | POA: Diagnosis not present

## 2017-03-01 DIAGNOSIS — M47816 Spondylosis without myelopathy or radiculopathy, lumbar region: Secondary | ICD-10-CM | POA: Diagnosis not present

## 2017-03-01 DIAGNOSIS — M7542 Impingement syndrome of left shoulder: Secondary | ICD-10-CM | POA: Diagnosis not present

## 2017-03-08 DIAGNOSIS — M7542 Impingement syndrome of left shoulder: Secondary | ICD-10-CM | POA: Diagnosis not present

## 2017-03-08 DIAGNOSIS — M25511 Pain in right shoulder: Secondary | ICD-10-CM | POA: Diagnosis not present

## 2017-03-14 DIAGNOSIS — E782 Mixed hyperlipidemia: Secondary | ICD-10-CM | POA: Diagnosis not present

## 2017-03-14 DIAGNOSIS — E1143 Type 2 diabetes mellitus with diabetic autonomic (poly)neuropathy: Secondary | ICD-10-CM | POA: Diagnosis not present

## 2017-03-15 ENCOUNTER — Encounter: Payer: Self-pay | Admitting: Family Medicine

## 2017-03-15 ENCOUNTER — Ambulatory Visit (INDEPENDENT_AMBULATORY_CARE_PROVIDER_SITE_OTHER): Payer: Medicare Other | Admitting: Family Medicine

## 2017-03-15 ENCOUNTER — Other Ambulatory Visit: Payer: Self-pay

## 2017-03-15 VITALS — BP 132/74 | HR 82 | Temp 98.1°F | Resp 16 | Ht 65.0 in | Wt 226.0 lb

## 2017-03-15 DIAGNOSIS — E1143 Type 2 diabetes mellitus with diabetic autonomic (poly)neuropathy: Secondary | ICD-10-CM

## 2017-03-15 DIAGNOSIS — K760 Fatty (change of) liver, not elsewhere classified: Secondary | ICD-10-CM

## 2017-03-15 DIAGNOSIS — Z0000 Encounter for general adult medical examination without abnormal findings: Secondary | ICD-10-CM

## 2017-03-15 DIAGNOSIS — E1141 Type 2 diabetes mellitus with diabetic mononeuropathy: Secondary | ICD-10-CM

## 2017-03-15 DIAGNOSIS — M25551 Pain in right hip: Secondary | ICD-10-CM

## 2017-03-15 LAB — COMPREHENSIVE METABOLIC PANEL
AG RATIO: 1.4 (calc) (ref 1.0–2.5)
ALT: 35 U/L — AB (ref 6–29)
AST: 38 U/L — ABNORMAL HIGH (ref 10–35)
Albumin: 3.8 g/dL (ref 3.6–5.1)
Alkaline phosphatase (APISO): 48 U/L (ref 33–130)
BUN: 25 mg/dL (ref 7–25)
CHLORIDE: 102 mmol/L (ref 98–110)
CO2: 31 mmol/L (ref 20–32)
CREATININE: 0.91 mg/dL (ref 0.60–0.93)
Calcium: 9.5 mg/dL (ref 8.6–10.4)
GLOBULIN: 2.7 g/dL (ref 1.9–3.7)
GLUCOSE: 134 mg/dL — AB (ref 65–99)
Potassium: 4 mmol/L (ref 3.5–5.3)
Sodium: 140 mmol/L (ref 135–146)
TOTAL PROTEIN: 6.5 g/dL (ref 6.1–8.1)
Total Bilirubin: 0.5 mg/dL (ref 0.2–1.2)

## 2017-03-15 LAB — CBC WITH DIFFERENTIAL/PLATELET
BASOS ABS: 17 {cells}/uL (ref 0–200)
BASOS PCT: 0.2 %
EOS ABS: 148 {cells}/uL (ref 15–500)
EOS PCT: 1.7 %
HCT: 38.3 % (ref 35.0–45.0)
HEMOGLOBIN: 12.6 g/dL (ref 11.7–15.5)
LYMPHS ABS: 2601 {cells}/uL (ref 850–3900)
MCH: 27.3 pg (ref 27.0–33.0)
MCHC: 32.9 g/dL (ref 32.0–36.0)
MCV: 82.9 fL (ref 80.0–100.0)
MONOS PCT: 7 %
MPV: 11.1 fL (ref 7.5–12.5)
NEUTROS PCT: 61.2 %
Neutro Abs: 5324 cells/uL (ref 1500–7800)
Platelets: 239 10*3/uL (ref 140–400)
RBC: 4.62 10*6/uL (ref 3.80–5.10)
RDW: 13.3 % (ref 11.0–15.0)
TOTAL LYMPHOCYTE: 29.9 %
WBC: 8.7 10*3/uL (ref 3.8–10.8)
WBCMIX: 609 {cells}/uL (ref 200–950)

## 2017-03-15 LAB — LIPID PANEL
CHOL/HDL RATIO: 3 (calc) (ref <5.0)
Cholesterol: 141 mg/dL (ref <200)
HDL: 47 mg/dL — AB (ref >50)
LDL CHOLESTEROL (CALC): 68 mg/dL
NON-HDL CHOLESTEROL (CALC): 94 mg/dL (ref <130)
TRIGLYCERIDES: 181 mg/dL — AB (ref <150)

## 2017-03-15 LAB — HEMOGLOBIN A1C
EAG (MMOL/L): 8.2 (calc)
Hgb A1c MFr Bld: 6.8 % of total Hgb — ABNORMAL HIGH (ref <5.7)
MEAN PLASMA GLUCOSE: 148 (calc)

## 2017-03-15 NOTE — Patient Instructions (Addendum)
Referral to [facility] for Hip  Neuropathy cream  F/U 4 months

## 2017-03-15 NOTE — Progress Notes (Signed)
Subjective:   Patient presents for Medicare Annual/Subsequent preventive examination.   Increasing Right hip pain past few months, no recent fall or injury  DM- cbg have been good, no meter today, reviewed fasting labs from CPE, no hypoglycemia symptoms, but neuropathy worsening in both great toes L > R  Has form to be completed for DMV and to get BP monitor   Medications reviewed  Still following with ortho for her shoulder pain, planning for a new MRI  Review Past Medical/Family/Social: Per EMR   Risk Factors  Current exercise habits: walks/ [employer] Dietary issues discussed: Yes   Cardiac risk factors: Obesity (BMI >= 30 kg/m2). D M, HTN  Depression Screen  (Note: if answer to either of the following is "Yes", a more complete depression screening is indicated)  Over the past two weeks, have you felt down, depressed or hopeless? No Over the past two weeks, have you felt little interest or pleasure in doing things? No Have you lost interest or pleasure in daily life? No Do you often feel hopeless? No Do you cry easily over simple problems? No   Activities of Daily Living  In your present state of health, do you have any difficulty performing the following activities?:  Driving? No  Managing money? No  Feeding yourself? No  Getting from bed to chair?  yes Climbing a flight of stairs?  Yes Preparing food and eating?: No  Bathing or showering? No  Getting dressed: No  Getting to the toilet? No  Using the toilet:No  Moving around from place to place: Yes In the past year have you fallen or had a near fall?:Yes  Are you sexually active? No  Do you have more than one partner? No   Hearing Difficulties: NO Do you often ask people to speak up or repeat themselves? No  Do you experience ringing or noises in your ears? No Do you have difficulty understanding soft or whispered voices? No  Do you feel that you have a problem with memory? No Do you often misplace items? No  Do  you feel safe at home? Yes  Cognitive Testing  Alert? Yes Normal Appearance?Yes  Oriented to person? Yes Place? Yes  Time? Yes  Recall of three objects? Yes  Can perform simple calculations? Yes  Displays appropriate judgment?Yes  Can read the correct time from a watch face?Yes   List the Names of Other Physician/Practitioners you currently use:   Orthopedics Gastroenterology  Ophthalmology     Screening Tests / Date Colonoscopy  Declines                    Zostavax - UTD Mammogram UTD  Pneumonia- UTD Influenza Vaccine UTD Tetanus/tdap- Not coverered   ROS: GEN- denies fatigue, fever, weight loss,weakness, recent illness HEENT- denies eye drainage, change in vision, nasal discharge, CVS- denies chest pain, palpitations RESP- denies SOB, cough, wheeze ABD- denies N/V, change in stools, abd pain GU- denies dysuria, hematuria, dribbling, incontinence MSK- + joint pain, muscle aches, injury Neuro- denies headache, dizziness, syncope, seizure activity  PHYSICAL: GEN- NAD, alert and oriented x3,obese  HEENT- PERRL, EOMI, non injected sclera, pink conjunctiva, MMM, oropharynx clear Neck- Supple, no thryomegaly, no bruit  CVS- RRR, no murmur RESP-CTAB ABD-NABS,soft,NT,ND MSK- Fair ROM hips, spine, no clicks, mild antalgic gait, fair ROM Bilat knees +crepitus  EXT- No edema Pulses- Radial, DP- 2+    Assessment:    Annual wellness medicare exam   Plan:    During the  course of the visit the patient was educated and counseled about appropriate screening and preventive services including:  TDAP can not afford otherwise immunizations UTD   DM- A1C improved, renal function looks good, continue Trulicity also helping with her weight. No SE from medications Diabetic neuropathy- continued need for diabetic shoes, wll also use topical neuropathic cream    HTN- well controlled   Right Hip pain- due to other history, likelyOA of hip, would like ortho to evaluate, referral  placed .    creen Neg  for depression. PHQ- 9 score of  2   Discussed advanced directives- given handout     Diet review for nutrition referral? Yes ____ Not Indicated __x__  Patient Instructions (the written plan) was given to the patient.  Medicare Attestation  I have personally reviewed:  The patient's medical and social history  Their use of alcohol, tobacco or illicit drugs  Their current medications and supplements  The patient's functional ability including ADLs,fall risks, home safety risks, cognitive, and hearing and visual impairment  Diet and physical activities  Evidence for depression or mood disorders  The patient's weight, height, BMI, and visual acuity have been recorded in the chart. I have made referrals, counseling, and provided education to the patient based on review of the above and I have provided the patient with a written personalized care plan for preventive services.

## 2017-03-16 DIAGNOSIS — S46112A Strain of muscle, fascia and tendon of long head of biceps, left arm, initial encounter: Secondary | ICD-10-CM | POA: Diagnosis not present

## 2017-03-16 DIAGNOSIS — Z9889 Other specified postprocedural states: Secondary | ICD-10-CM | POA: Diagnosis not present

## 2017-03-16 DIAGNOSIS — M79622 Pain in left upper arm: Secondary | ICD-10-CM | POA: Diagnosis not present

## 2017-04-01 DIAGNOSIS — M7542 Impingement syndrome of left shoulder: Secondary | ICD-10-CM | POA: Diagnosis not present

## 2017-04-01 DIAGNOSIS — M25511 Pain in right shoulder: Secondary | ICD-10-CM | POA: Diagnosis not present

## 2017-04-07 DIAGNOSIS — M25511 Pain in right shoulder: Secondary | ICD-10-CM | POA: Diagnosis not present

## 2017-04-15 ENCOUNTER — Other Ambulatory Visit: Payer: Self-pay | Admitting: Family Medicine

## 2017-04-22 DIAGNOSIS — M25511 Pain in right shoulder: Secondary | ICD-10-CM | POA: Diagnosis not present

## 2017-05-03 ENCOUNTER — Other Ambulatory Visit: Payer: Self-pay | Admitting: Family Medicine

## 2017-05-22 ENCOUNTER — Other Ambulatory Visit: Payer: Self-pay | Admitting: Family Medicine

## 2017-05-23 DIAGNOSIS — E119 Type 2 diabetes mellitus without complications: Secondary | ICD-10-CM | POA: Diagnosis not present

## 2017-05-23 DIAGNOSIS — Z961 Presence of intraocular lens: Secondary | ICD-10-CM | POA: Diagnosis not present

## 2017-05-23 DIAGNOSIS — Z9849 Cataract extraction status, unspecified eye: Secondary | ICD-10-CM | POA: Diagnosis not present

## 2017-05-23 DIAGNOSIS — H43813 Vitreous degeneration, bilateral: Secondary | ICD-10-CM | POA: Diagnosis not present

## 2017-05-23 LAB — HM DIABETES EYE EXAM

## 2017-05-27 ENCOUNTER — Ambulatory Visit: Payer: Medicare Other | Admitting: Family Medicine

## 2017-05-29 ENCOUNTER — Other Ambulatory Visit: Payer: Self-pay | Admitting: Family Medicine

## 2017-05-31 ENCOUNTER — Other Ambulatory Visit: Payer: Self-pay

## 2017-05-31 ENCOUNTER — Ambulatory Visit (INDEPENDENT_AMBULATORY_CARE_PROVIDER_SITE_OTHER): Payer: Medicare Other | Admitting: Family Medicine

## 2017-05-31 ENCOUNTER — Encounter: Payer: Self-pay | Admitting: Family Medicine

## 2017-05-31 VITALS — Temp 98.3°F | Resp 16 | Ht 65.0 in | Wt 227.0 lb

## 2017-05-31 DIAGNOSIS — R001 Bradycardia, unspecified: Secondary | ICD-10-CM

## 2017-05-31 DIAGNOSIS — Z1329 Encounter for screening for other suspected endocrine disorder: Secondary | ICD-10-CM | POA: Diagnosis not present

## 2017-05-31 DIAGNOSIS — R42 Dizziness and giddiness: Secondary | ICD-10-CM

## 2017-05-31 DIAGNOSIS — I4430 Unspecified atrioventricular block: Secondary | ICD-10-CM

## 2017-05-31 DIAGNOSIS — I951 Orthostatic hypotension: Secondary | ICD-10-CM | POA: Diagnosis not present

## 2017-05-31 DIAGNOSIS — I10 Essential (primary) hypertension: Secondary | ICD-10-CM

## 2017-05-31 NOTE — Patient Instructions (Addendum)
We will call with lab results  Referral to cardiology Decrease maxide to 1/2 tablet  F/U pending results

## 2017-05-31 NOTE — Progress Notes (Signed)
   Subjective:    Patient ID: Julie Clay, female    DOB: Jun 10, 1940, 77 y.o.   MRN: [id number]  Patient presents for Dizziness (states that she has lightheadedness when she stand up, when she changes temps (going inside after being outside))  Pt here with sensation of feeling lightheaded. She has had before and her BP medications were adjusted  When she stands up she feels light headed.   She is currently on maxide 75-50mg    At times she does have to belch after she gets the sensation   DM- last A1C 6.8% , taking glipizide and Trulicity , metformin , CBG have been good, had a few lows when she forgot to eat   Review Of Systems:  GEN- denies fatigue, fever, weight loss,weakness, recent illness HEENT- denies eye drainage, change in vision, nasal discharge, CVS- denies chest pain, palpitations RESP- denies SOB, cough, wheeze ABD- denies N/V, change in stools, abd pain GU- denies dysuria, hematuria, dribbling, incontinence MSK- denies joint pain, muscle aches, injury Neuro- denies headache,+ dizziness, syncope, seizure activity       Objective:    Temp 98.3 F (36.8 C) (Oral)   Resp 16   Ht 5\' 5"  (1.651 m)   Wt 227 lb (103 kg)   SpO2 97%   BMI 37.77 kg/m  GEN- NAD, alert and oriented x3 , orthostatics reviewed  HEENT- PERRL, EOMI, non injected sclera, pink conjunctiva, MMM, oropharynx clear Neck- Supple, no thyromegaly, no bruit  CVS- RRR, no murmur RESP-CTAB ABD-NABS,soft,NT,ND Neuro-CNII-XII in tact, no focal deficits EXT- No edema Pulses- Radial 2+   EKG- Bradycardia HR  47 , AV block      Assessment & Plan:      Problem List Items Addressed This Visit      Unprioritized   Essential hypertension, benign   Relevant Orders   EKG 12-Lead (Completed)    Other Visit Diagnoses    Orthostatic hypotension    -  Primary   Concern for cardic causese she is howing signs of orthostasis and bradycardia with new AV block. Will send to cardiology, she is not on any  rate controlling medications Will decrease her maxide to 1/2 tablet Blood glucose has been good, but she is aware of what to do if she does get low sugars Check lytes, TSH She has not had any syncope at this time    Relevant Orders   CBC with Differential/Platelet   Comprehensive metabolic panel   TSH   EKG 12-Lead (Completed)   Ambulatory referral to Cardiology   Bradycardia       Relevant Orders   Ambulatory referral to Cardiology   AV block       Relevant Orders   Ambulatory referral to Cardiology   Light headed       Relevant Orders   CBC with Differential/Platelet   Comprehensive metabolic panel   TSH   Magnesium   EKG 12-Lead (Completed)      Note: This dictation was prepared with Dragon dictation along with smaller phrase technology. Any transcriptional errors that result from this process are unintentional.

## 2017-06-01 LAB — CBC WITH DIFFERENTIAL/PLATELET
BASOS ABS: 48 {cells}/uL (ref 0–200)
Basophils Relative: 0.4 %
EOS ABS: 155 {cells}/uL (ref 15–500)
EOS PCT: 1.3 %
HEMATOCRIT: 40.4 % (ref 35.0–45.0)
HEMOGLOBIN: 13.4 g/dL (ref 11.7–15.5)
Lymphs Abs: 2844 cells/uL (ref 850–3900)
MCH: 26.7 pg — AB (ref 27.0–33.0)
MCHC: 33.2 g/dL (ref 32.0–36.0)
MCV: 80.5 fL (ref 80.0–100.0)
MONOS PCT: 8.1 %
MPV: 11.3 fL (ref 7.5–12.5)
NEUTROS ABS: 7890 {cells}/uL — AB (ref 1500–7800)
NEUTROS PCT: 66.3 %
Platelets: 280 10*3/uL (ref 140–400)
RBC: 5.02 10*6/uL (ref 3.80–5.10)
RDW: 13.3 % (ref 11.0–15.0)
Total Lymphocyte: 23.9 %
WBC mixed population: 964 cells/uL — ABNORMAL HIGH (ref 200–950)
WBC: 11.9 10*3/uL — ABNORMAL HIGH (ref 3.8–10.8)

## 2017-06-01 LAB — COMPREHENSIVE METABOLIC PANEL
AG Ratio: 1.5 (calc) (ref 1.0–2.5)
ALT: 22 U/L (ref 6–29)
AST: 24 U/L (ref 10–35)
Albumin: 4.3 g/dL (ref 3.6–5.1)
Alkaline phosphatase (APISO): 55 U/L (ref 33–130)
BILIRUBIN TOTAL: 0.4 mg/dL (ref 0.2–1.2)
BUN/Creatinine Ratio: 31 (calc) — ABNORMAL HIGH (ref 6–22)
BUN: 35 mg/dL — AB (ref 7–25)
CALCIUM: 10.4 mg/dL (ref 8.6–10.4)
CO2: 25 mmol/L (ref 20–32)
Chloride: 103 mmol/L (ref 98–110)
Creat: 1.12 mg/dL — ABNORMAL HIGH (ref 0.60–0.93)
Globulin: 2.8 g/dL (calc) (ref 1.9–3.7)
Glucose, Bld: 98 mg/dL (ref 65–99)
Potassium: 4.1 mmol/L (ref 3.5–5.3)
Sodium: 142 mmol/L (ref 135–146)
Total Protein: 7.1 g/dL (ref 6.1–8.1)

## 2017-06-01 LAB — TSH: TSH: 3.42 m[IU]/L (ref 0.40–4.50)

## 2017-06-01 LAB — MAGNESIUM: MAGNESIUM: 1.7 mg/dL (ref 1.5–2.5)

## 2017-06-05 ENCOUNTER — Other Ambulatory Visit: Payer: Self-pay | Admitting: Family Medicine

## 2017-06-06 ENCOUNTER — Other Ambulatory Visit: Payer: Self-pay | Admitting: Family Medicine

## 2017-06-07 ENCOUNTER — Ambulatory Visit (INDEPENDENT_AMBULATORY_CARE_PROVIDER_SITE_OTHER): Payer: Medicare Other

## 2017-06-07 ENCOUNTER — Other Ambulatory Visit: Payer: Self-pay

## 2017-06-07 ENCOUNTER — Emergency Department (HOSPITAL_COMMUNITY)
Admission: EM | Admit: 2017-06-07 | Discharge: 2017-06-07 | Disposition: A | Discharge status: Home / Self Care | Payer: Medicare Other | Attending: Emergency Medicine | Admitting: Emergency Medicine

## 2017-06-07 ENCOUNTER — Telehealth: Payer: Self-pay | Admitting: Cardiology

## 2017-06-07 ENCOUNTER — Emergency Department (HOSPITAL_COMMUNITY): Payer: Medicare Other

## 2017-06-07 ENCOUNTER — Encounter (HOSPITAL_COMMUNITY): Payer: Self-pay | Admitting: Emergency Medicine

## 2017-06-07 DIAGNOSIS — R42 Dizziness and giddiness: Secondary | ICD-10-CM | POA: Diagnosis not present

## 2017-06-07 DIAGNOSIS — I499 Cardiac arrhythmia, unspecified: Secondary | ICD-10-CM | POA: Diagnosis not present

## 2017-06-07 DIAGNOSIS — I129 Hypertensive chronic kidney disease with stage 1 through stage 4 chronic kidney disease, or unspecified chronic kidney disease: Secondary | ICD-10-CM | POA: Insufficient documentation

## 2017-06-07 DIAGNOSIS — J45909 Unspecified asthma, uncomplicated: Secondary | ICD-10-CM | POA: Diagnosis not present

## 2017-06-07 DIAGNOSIS — I441 Atrioventricular block, second degree: Secondary | ICD-10-CM | POA: Insufficient documentation

## 2017-06-07 DIAGNOSIS — N182 Chronic kidney disease, stage 2 (mild): Secondary | ICD-10-CM | POA: Diagnosis not present

## 2017-06-07 DIAGNOSIS — Z7722 Contact with and (suspected) exposure to environmental tobacco smoke (acute) (chronic): Secondary | ICD-10-CM | POA: Insufficient documentation

## 2017-06-07 DIAGNOSIS — Z7984 Long term (current) use of oral hypoglycemic drugs: Secondary | ICD-10-CM | POA: Insufficient documentation

## 2017-06-07 DIAGNOSIS — E1122 Type 2 diabetes mellitus with diabetic chronic kidney disease: Secondary | ICD-10-CM | POA: Diagnosis not present

## 2017-06-07 DIAGNOSIS — I455 Other specified heart block: Secondary | ICD-10-CM | POA: Diagnosis not present

## 2017-06-07 DIAGNOSIS — E1140 Type 2 diabetes mellitus with diabetic neuropathy, unspecified: Secondary | ICD-10-CM | POA: Insufficient documentation

## 2017-06-07 DIAGNOSIS — Z79899 Other long term (current) drug therapy: Secondary | ICD-10-CM | POA: Insufficient documentation

## 2017-06-07 LAB — BASIC METABOLIC PANEL
Anion gap: 13 (ref 5–15)
BUN: 26 mg/dL — AB (ref 6–20)
CALCIUM: 9.6 mg/dL (ref 8.9–10.3)
CO2: 26 mmol/L (ref 22–32)
CREATININE: 1.03 mg/dL — AB (ref 0.44–1.00)
Chloride: 103 mmol/L (ref 101–111)
GFR, EST NON AFRICAN AMERICAN: 52 mL/min — AB (ref >60)
Glucose, Bld: 167 mg/dL — ABNORMAL HIGH (ref 65–99)
Potassium: 3.6 mmol/L (ref 3.5–5.1)
SODIUM: 142 mmol/L (ref 135–145)

## 2017-06-07 LAB — CBC WITH DIFFERENTIAL/PLATELET
BASOS PCT: 0 %
Basophils Absolute: 0 10*3/uL (ref 0.0–0.1)
EOS ABS: 0.1 10*3/uL (ref 0.0–0.7)
Eosinophils Relative: 2 %
HCT: 39.6 % (ref 36.0–46.0)
HEMOGLOBIN: 12.4 g/dL (ref 12.0–15.0)
Lymphocytes Relative: 26 %
Lymphs Abs: 2.4 10*3/uL (ref 0.7–4.0)
MCH: 26.7 pg (ref 26.0–34.0)
MCHC: 31.3 g/dL (ref 30.0–36.0)
MCV: 85.3 fL (ref 78.0–100.0)
Monocytes Absolute: 0.7 10*3/uL (ref 0.1–1.0)
Monocytes Relative: 8 %
NEUTROS PCT: 64 %
Neutro Abs: 6.2 10*3/uL (ref 1.7–7.7)
PLATELETS: 214 10*3/uL (ref 150–400)
RBC: 4.64 MIL/uL (ref 3.87–5.11)
RDW: 13.9 % (ref 11.5–15.5)
WBC: 9.5 10*3/uL (ref 4.0–10.5)

## 2017-06-07 LAB — I-STAT TROPONIN, ED: TROPONIN I, POC: 0 ng/mL (ref 0.00–0.08)

## 2017-06-07 NOTE — ED Triage Notes (Signed)
Heart rate has been irregular x 1 week ago.  Pt has been checking hr at home.  Has seen pcp and has appt scheduled for cardiologist feb 26.  Pt reports hr has gotten down in the 40's.

## 2017-06-07 NOTE — Telephone Encounter (Signed)
Received phone call from Dr. Tomi Bamberger in the [facility] ER regarding Julie Clay.  She presented there complaining of intermittent irregular heartbeat and feeling of weakness.  No reported chest pain or syncope however.  Actually, she is scheduled to be seen by Dr. Harl Bowie on February 26 for further evaluation of these symptoms.  While in the ER she was noted to have a transient episode of 2:1 heart block, otherwise hemodynamically stable however and without syncope.  Troponin I level is negative.  Plan at this time is to go ahead and place a 7-day event monitor on the patient anticipating discharge home today, and she should keep her pending visit with Dr. Harl Bowie on February 26 for further evaluation.  Question will be whether she has clear indication for pacemaker.  Medication list does not include any AV nodal blockers.  Satira Sark, M.D., F.A.C.C.

## 2017-06-07 NOTE — ED Provider Notes (Signed)
[facility] Provider Note   CSN: [id number] Arrival date & time: 06/07/17  1346     History   Chief Complaint Chief Complaint  Patient presents with  . Irregular Heart Beat    HPI Julie Clay is a 77 y.o. female.  HPI Patient presents to the emergency room for evaluation of irregular heartbeat.  Patient states she has had trouble with an irregular heartbeat off and on maybe for several months.  In the last week or so however it seems to becoming more quickly.  Patient states sometimes her heart rate will just speed up and then slow down.  She gets lightheaded during some of these events.  She has not actually lost consciousness though.  She denies any chest pain.  No shortness of breath.  She went to see her primary care doctor and has a cardiologist appointment scheduled but is not until February 26.  Her symptoms seem to be escalating and so she was concerned and decided to come to the emergency room Past Medical History:  Diagnosis Date  . Arthritis    Osteoarthritis  . Arthritis   . Asthma    spirometry (2011)- mild ventilary defect  . Calcification of left breast   . Cancer ([place]) 2012   Breast  . Diabetes mellitus   . Dysrhythmia    palpatations  . Fatty liver 2013   enlarged  . HSV (herpes simplex virus) infection   . Hyperlipidemia   . Hypertension   . 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 Lung nodule seen on imaging study 2013  . Lymphedema    Left arm  . Shortness of breath    with exertion  . Sleep apnea    Stop Bang score of 5. Pt has had sleep study, but was shown to be negative for sleep apnea.  . Sliding hiatal hernia   . Ventral hernia, unspecified, without mention of obstruction or gangrene     Patient Active Problem List   Diagnosis Date Noted  . Heart murmur 05/31/2016  . Primary osteoarthritis of first carpometacarpal joint of left hand 06/12/2015  . Fatty liver disease, nonalcoholic  16/04/930  . Lymphedema 02/18/2015  . Leg cramps 08/02/2014  . Ganglion cyst of flexor tendon sheath of finger of left hand 08/02/2014  . Elevated LFTs 04/28/2014  . Skin irritation 01/19/2014  . Post-operative state 11/14/2013  . Palpitations 10/12/2013  . Atypical ductal hyperplasia of left breast 07/12/2013  . Endometrial polyp 06/25/2013  . Rectocele 06/25/2013  . Diabetic neuropathy ([place]) 05/09/2013  . Pre-ulcerative calluses 05/09/2013  . Pelvic pain 05/09/2013  . Bladder prolapse, female, acquired 05/09/2013  . Pain, joint, multiple sites 03/26/2013  . 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 Insomnia 11/29/2012  . Hip pain 09/08/2012  . Occupational therapy encounter 08/30/2012  . Muscle weakness (generalized) 06/26/2012  . Rotator cuff syndrome of left shoulder 06/21/2012  . Pain in joint, shoulder region 06/16/2012  . Peripheral edema 02/04/2012  . HSV (herpes simplex virus) infection 12/09/2011  . Dyspnea 11/15/2011  . 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 Essential hypertension, benign 10/11/2011  . DM (diabetes mellitus) ([place]) 10/11/2011  . OA (osteoarthritis) 10/11/2011  . Morbidly obese ([place]) 10/11/2011  . Asthma 10/11/2011  . Hyperlipidemia 10/11/2011  . Chronic fatigue 10/11/2011  . OSA (obstructive sleep apnea) 10/11/2011    Past Surgical History:  Procedure Laterality Date  . BREAST BIOPSY  06/22/13  . BREAST BIOPSY Left 09/12/2013   Procedure: BREAST BIOPSY WITH NEEDLE LOCALIZATION;  Surgeon: Jamesetta So, MD;  Location: [facility];  Service: General;  Laterality: Left;  . BREAST SURGERY  jan 2013  . CATARACT EXTRACTION, BILATERAL     on different occassions   . CESAREAN SECTION    . HAND LIGAMENT RECONSTRUCTION Right   . HYSTEROSCOPY W/D&C N/A 11/06/2013   Procedure: DILATATION AND CURETTAGE /HYSTEROSCOPY;  Surgeon: Jonnie Kind, MD;  Location: [facility];  Service: Gynecology;  Laterality: N/A;  . LUNG  LOBECTOMY Right 1988   Fungal Infection  . POLYPECTOMY N/A 11/06/2013   Procedure: POLYPECTOMY (REMOVAL ENDOMETRIAL POLYP);  Surgeon: Jonnie Kind, MD;  Location: [facility];  Service: Gynecology;  Laterality: N/A;  . RECTOCELE REPAIR N/A 11/06/2013   Procedure: POSTERIOR REPAIR (RECTOCELE);  Surgeon: Jonnie Kind, MD;  Location: [facility];  Service: Gynecology;  Laterality: N/A;  . SHOULDER ARTHROSCOPY WITH OPEN ROTATOR CUFF REPAIR Left 07/26/2012    OB History    Gravida Para Term Preterm AB Living   2 2       2    SAB TAB Ectopic Multiple Live Births           2       Home Medications    Prior to Admission medications   Medication Sig Start Date End Date Taking? Authorizing Provider  acetaminophen (TYLENOL) 500 MG tablet Take 500 mg by mouth as needed for moderate pain. Takes 2 when needed   Yes [provider]  albuterol (PROVENTIL HFA;VENTOLIN HFA) 108 (90 Base) MCG/ACT inhaler Inhale 2 puffs into the lungs every 4 (four) hours as needed. 06/02/16  Yes [place], Modena Nunnery, MD  Aloe Vera Leaf POWD Take 800 mg by mouth 2 (two) times daily.    Yes [provider]  Ascorbic Acid (VITAMIN C) 1000 MG tablet Take 1,000 mg by mouth 2 (two) times daily.   Yes [provider]  b complex vitamins tablet Take 1 tablet by mouth 2 (two) times daily.    Yes [provider]  Cholecalciferol (VITAMIN D3) 2000 UNITS TABS Take 2,000 Units by mouth 2 (two) times daily.   Yes [provider]  Docusate Calcium (STOOL SOFTENER PO) Take 1 capsule by mouth as needed (constipation).    Yes [provider]  DULERA 100-5 MCG/ACT AERO INHALE 2 PUFFS INTO THE LUNGS TWICE DAILY 06/06/17  Yes [place], Modena Nunnery, MD  Echinacea 380 MG CAPS Take 1 capsule by mouth as needed (cold/flue).   Yes [provider]  Ginkgo Biloba 120 MG CAPS Take 1 capsule by mouth daily.    Yes [provider]  glipiZIDE (GLUCOTROL) 10 MG tablet TAKE 1 TABLET BY MOUTH TWICE  DAILY BEFORE A MEAL 06/06/17  Yes [place], Modena Nunnery, MD  HYDROcodone-acetaminophen (NORCO) 10-325 MG tablet Take 1 tablet by mouth every 6 (six) hours as needed.   Yes [provider]  KRILL OIL PO Take 1 capsule by mouth 2 (two) times daily.   Yes [provider]  Magnesium 400 MG TABS Take 400 mg by mouth daily.   Yes [provider]  Melatonin 3 MG TABS Take 3 mg by mouth at bedtime as needed and may repeat dose one time if needed (sleep).    Yes [provider]  metFORMIN (GLUCOPHAGE) 1000 MG tablet TAKE 1 TABLET BY MOUTH TWICE DAILY WITH A MEAL 05/03/17  Yes [place], Modena Nunnery, MD  montelukast (  SINGULAIR) 10 MG tablet TAKE 1 TABLET BY MOUTH EVERY NIGHT AT BEDTIME 05/03/17  Yes [place], Modena Nunnery, MD  Multiple Vitamin (MULTIVITAMIN WITH MINERALS) TABS tablet Take 1 tablet by mouth daily.   Yes [provider]  NONFORMULARY OR COMPOUNDED ITEM Apply 1 application topically 2 (two) times daily as needed. Reported on 06/10/2015   Yes [provider]  rosuvastatin (CRESTOR) 5 MG tablet TAKE 1 TABLET(5 MG) BY MOUTH DAILY 04/15/17  Yes [place], Modena Nunnery, MD  simethicone (MYLICON) 80 MG chewable tablet Chew 80 mg by mouth as needed for flatulence.    Yes [provider]  tiZANidine (ZANAFLEX) 4 MG tablet TAKE 1 TABLET BY MOUTH EVERY NIGHT AT BEDTIME AS NEEDED FOR MUSCLE SPASMS 04/15/17  Yes [place], Modena Nunnery, MD  triamcinolone cream (KENALOG) 0.1 % Apply 1 application topically 2 (two) times daily. 09/09/16  Yes Dena Billet B, PA-C  triamterene-hydrochlorothiazide (MAXZIDE) 75-50 MG tablet TAKE 1 TABLET BY MOUTH EVERY DAY 04/15/17  Yes [place], Modena Nunnery, MD  TRULICITY 8.41 YS/0.6TK SOPN INJECT 1 PEN UNDER THE SKIN EVERY WEEK 05/30/17  Yes [place], Modena Nunnery, MD  fluocinonide cream (LIDEX) 1.60 % Apply 1 application topically 2 (two) times daily as needed (poison ivy/bites/hives). Patient not taking: Reported on 06/07/2017 11/08/14   Alycia Rossetti,  MD  Naftifine HCl 1 % GEL Apply 1 application topically daily as needed.    [provider]    Family History Family History  Problem Relation Age of Onset  . Hypertension Mother   . Heart disease Mother   . Diabetes Mother   . Kidney disease Mother        ESRD  . Cancer Mother        Bladder  . Hypertension Father   . Hyperlipidemia Father   . Heart disease Father   . Diabetes Father   . Cancer Father        Stomach Cancer  . Diabetes Sister   . Heart disease Sister   . Alzheimer's disease Paternal Aunt   . Stroke Maternal Grandfather   . Cancer Paternal Grandfather        stomach  . Cancer Maternal Uncle        prostate  . Cancer Paternal Uncle        stomach    Social History Social History   Tobacco Use  . Smoking status: Passive Smoke Exposure - Never Smoker  . Smokeless tobacco: Never Used  . Tobacco comment: husband smokes, but pt doesn't   Substance Use Topics  . Alcohol use: Yes    Alcohol/week: 0.0 oz    Comment: occasional drink twice a year  . Drug use: No     Allergies   Arimidex [anastrozole]; Adhesive [tape]; Lisinopril; and Tetracyclines & related   Review of Systems Review of Systems  All other systems reviewed and are negative.    Physical Exam Updated Vital Signs BP (!) 143/68 (BP Location: Right Wrist)   Pulse 74   Temp 98.1 F (36.7 C) (Oral)   Resp (!) 28   Ht 1.651 m (5\' 5" )   Wt 101 kg (222 lb 11.2 oz)   SpO2 97%   BMI 37.06 kg/m   Physical Exam  Constitutional: She appears well-developed and well-nourished. No distress.  HENT:  Head: Normocephalic and atraumatic.  Right Ear: External ear normal.  Left Ear: External ear normal.  Eyes: Conjunctivae are normal. Right eye exhibits no discharge. Left eye exhibits no discharge. No  scleral icterus.  Neck: Neck supple. No tracheal deviation present.  Cardiovascular: Normal rate and intact distal pulses. An irregular rhythm present.  Pulmonary/Chest: Effort normal  and breath sounds normal. No stridor. No respiratory distress. She has no wheezes. She has no rales.  Abdominal: Soft. Bowel sounds are normal. She exhibits no distension. There is no tenderness. There is no rebound and no guarding.  Musculoskeletal: She exhibits no edema or tenderness.  Neurological: She is alert. She has normal strength. No cranial nerve deficit (no facial droop, extraocular movements intact, no slurred speech) or sensory deficit. She exhibits normal muscle tone. She displays no seizure activity. Coordination normal.  Skin: Skin is warm and dry. No rash noted.  Psychiatric: She has a normal mood and affect.  Nursing note and vitals reviewed.    ED Treatments / Results  Labs (all labs ordered are listed, but only abnormal results are displayed) Labs Reviewed  BASIC METABOLIC PANEL - Abnormal; Notable for the following components:      Result Value   Glucose, Bld 167 (*)    BUN 26 (*)    Creatinine, Ser 1.03 (*)    GFR calc non Af Amer 52 (*)    All other components within normal limits  CBC WITH DIFFERENTIAL/PLATELET  I-STAT TROPONIN, ED    EKG  EKG Interpretation  Date/Time:  Tuesday June 07 2017 13:57:09 EST Ventricular Rate:  54 PR Interval:  160 QRS Duration: 122 QT Interval:  426 QTC Calculation: 403 R Axis:   12 Text Interpretation:  ** Critical Test Result: AV Block Sinus rhythm with 2nd degree A-V block (Mobitz II) with 2:1 A-V conduction Right bundle branch block Abnormal ECG heart block is new since last tracing Confirmed by Dorie Rank [phone]) on 06/07/2017 2:09:20 PM       Radiology Dg Chest 2 View  Result Date: 06/07/2017 CLINICAL DATA:  Arrhythmia x1 week with lightheadedness. Right lung surgery 1988. History of breast and lung cancer. EXAM: CHEST  2 VIEW COMPARISON:  01/30/2016 FINDINGS: The heart size and mediastinal contours are within normal limits. Aortic atherosclerosis without aneurysm. Scarring in the right mid lung with  postsurgical deformity of the right posterolateral sixth rib. No pulmonary mass, effusion or pneumothorax. Mild chronic elevation of the right hemidiaphragm likely from volume loss prior surgery. No acute nor suspicious osseous lesions. IMPRESSION: Chronic stable scarring in the right mid lung. Aortic atherosclerosis. No active pulmonary disease. Electronically Signed   By: Ashley Royalty M.D.   On: 06/07/2017 14:42    Procedures Procedures (including critical care time)  Medications Ordered in ED Medications - No data to display   Initial Impression / Assessment and Plan / ED Course  I have reviewed the triage vital signs and the nursing notes.  Pertinent labs & imaging results that were available during my care of the patient were reviewed by me and considered in my medical decision making (see chart for details).   Patient's EKG shows a second-degree heart block.  On the cardiac monitor the patient seems to be going in and out of that rhythm.  Patient is otherwise hemodynamically stable.  She is not hypotensive and has not had any syncopal events.  I will check laboratory tests and consult with cardiology.  I discussed the case with Dr. Domenic Polite, cardiology.  He reviewed the patient's EKGs.  Patient is currently asymptomatic.  She has not had any syncopal events.  Dr. Domenic Polite arranged for the patient to have an outpatient cardiac  monitor.  That was placed on her while she was in the emergency room.  Patient will follow up with her cardiologist next week.  Final Clinical Impressions(s) / ED Diagnoses   Final diagnoses:  Heart block AV second degree    ED Discharge Orders    None       Dorie Rank, MD 06/07/17 1553

## 2017-06-07 NOTE — Discharge Instructions (Signed)
Follow up with your cardiologist appointment as planned.  Wear the heart monitor as instructed.  Return for trouble with chest pain or fainting spells.

## 2017-06-07 NOTE — ED Notes (Signed)
Cardiology in to place holter monitor

## 2017-06-14 ENCOUNTER — Telehealth: Payer: Self-pay | Admitting: Cardiology

## 2017-06-14 ENCOUNTER — Encounter: Payer: Self-pay | Admitting: Cardiology

## 2017-06-14 ENCOUNTER — Other Ambulatory Visit: Payer: Self-pay

## 2017-06-14 ENCOUNTER — Ambulatory Visit (INDEPENDENT_AMBULATORY_CARE_PROVIDER_SITE_OTHER): Payer: Medicare Other | Admitting: Cardiology

## 2017-06-14 VITALS — BP 146/82 | HR 81 | Ht 65.0 in | Wt 228.6 lb

## 2017-06-14 DIAGNOSIS — I4430 Unspecified atrioventricular block: Secondary | ICD-10-CM

## 2017-06-14 DIAGNOSIS — R42 Dizziness and giddiness: Secondary | ICD-10-CM | POA: Diagnosis not present

## 2017-06-14 DIAGNOSIS — R001 Bradycardia, unspecified: Secondary | ICD-10-CM | POA: Diagnosis not present

## 2017-06-14 DIAGNOSIS — R011 Cardiac murmur, unspecified: Secondary | ICD-10-CM | POA: Diagnosis not present

## 2017-06-14 NOTE — Telephone Encounter (Signed)
Report faxed to Dr. Harl Bowie at [facility] office for review.

## 2017-06-14 NOTE — Progress Notes (Signed)
Clinical Summary Julie Clay is a 77 y.o.female seen as new consult, referred by Dr Buelah Manis for dizziness and bradycardia.   1. Dizziness - maxide recently decreased by pcp - normal TSH, normal K and Mg  - dizziness/lightheadedness started 2-3 years ago, significant over last 1.5 month. Typically comes on with position change. Lasts a few seconds. Occurs multiple time a day. Decreased endurance over the same time period. Fatigue with just walking short distances.  - no true syncope - occasional extra heart beats at times.  - drinks water x 3 bottles and 2 glasses, 1-2 cup of coffee, no sodas, occasional tea - symptoms unchanged with lower maxide.   2. Bradycardia/Palpitations - EKG shows 2:1 AV block - she has completed an event monitor, available tracings show episodes of 2:1 av block with rates down to 40s - reports ongoing dizziness, fatigue.    Past Medical History:  Diagnosis Date  . Arthritis    Osteoarthritis  . Arthritis   . Asthma    spirometry (2011)- mild ventilary defect  . Calcification of left breast   . Cancer ([place]) 2012   Breast  . Diabetes mellitus   . Dysrhythmia    palpatations  . Fatty liver 2013   enlarged  . HSV (herpes simplex virus) infection   . Hyperlipidemia   . Hypertension   . 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 Lung nodule seen on imaging study 2013  . Lymphedema    Left arm  . Shortness of breath    with exertion  . Sleep apnea    Stop Bang score of 5. Pt has had sleep study, but was shown to be negative for sleep apnea.  . Sliding hiatal hernia   . Ventral hernia, unspecified, without mention of obstruction or gangrene      Allergies  Allergen Reactions  . Arimidex [Anastrozole] Other (See Comments)    Arthralgias and myalgias.  Improved with 3 week hiatus from drug.  Categorical side effect of drug class.  . Adhesive [Tape] Other (See Comments)    "red and blistered"  . Lisinopril    Dry hacking cough and tickle  . Tetracyclines & Related Other (See Comments)    unknown     Current Outpatient Medications  Medication Sig Dispense Refill  . acetaminophen (TYLENOL) 500 MG tablet Take 500 mg by mouth as needed for moderate pain. Takes 2 when needed    . albuterol (PROVENTIL HFA;VENTOLIN HFA) 108 (90 Base) MCG/ACT inhaler Inhale 2 puffs into the lungs every 4 (four) hours as needed. 6.7 g 6  . Aloe Vera Leaf POWD Take 800 mg by mouth 2 (two) times daily.     . Ascorbic Acid (VITAMIN C) 1000 MG tablet Take 1,000 mg by mouth 2 (two) times daily.    Marland Kitchen b complex vitamins tablet Take 1 tablet by mouth 2 (two) times daily.     . Cholecalciferol (VITAMIN D3) 2000 UNITS TABS Take 2,000 Units by mouth 2 (two) times daily.    Mariane Baumgarten Calcium (STOOL SOFTENER PO) Take 1 capsule by mouth as needed (constipation).     . DULERA 100-5 MCG/ACT AERO INHALE 2 PUFFS INTO THE LUNGS TWICE DAILY 13 g 0  . Echinacea 380 MG CAPS Take 1 capsule by mouth as needed (cold/flue).    . fluocinonide cream (LIDEX) 4.65 % Apply 1 application topically 2 (two) times daily as needed (poison ivy/bites/hives). (Patient not taking: Reported on 06/07/2017) 30  g 11  . Ginkgo Biloba 120 MG CAPS Take 1 capsule by mouth daily.     Marland Kitchen glipiZIDE (GLUCOTROL) 10 MG tablet TAKE 1 TABLET BY MOUTH TWICE DAILY BEFORE A MEAL 180 tablet 0  . HYDROcodone-acetaminophen (NORCO) 10-325 MG tablet Take 1 tablet by mouth every 6 (six) hours as needed.    Marland Kitchen KRILL OIL PO Take 1 capsule by mouth 2 (two) times daily.    . Magnesium 400 MG TABS Take 400 mg by mouth daily.    . Melatonin 3 MG TABS Take 3 mg by mouth at bedtime as needed and may repeat dose one time if needed (sleep).     . metFORMIN (GLUCOPHAGE) 1000 MG tablet TAKE 1 TABLET BY MOUTH TWICE DAILY WITH A MEAL 180 tablet 0  . montelukast (SINGULAIR) 10 MG tablet TAKE 1 TABLET BY MOUTH EVERY NIGHT AT BEDTIME 90 tablet 0  . Multiple Vitamin (MULTIVITAMIN WITH MINERALS) TABS  tablet Take 1 tablet by mouth daily.    . Naftifine HCl 1 % GEL Apply 1 application topically daily as needed.    . NONFORMULARY OR COMPOUNDED ITEM Apply 1 application topically 2 (two) times daily as needed. Reported on 06/10/2015    . rosuvastatin (CRESTOR) 5 MG tablet TAKE 1 TABLET(5 MG) BY MOUTH DAILY 90 tablet 0  . simethicone (MYLICON) 80 MG chewable tablet Chew 80 mg by mouth as needed for flatulence.     Marland Kitchen tiZANidine (ZANAFLEX) 4 MG tablet TAKE 1 TABLET BY MOUTH EVERY NIGHT AT BEDTIME AS NEEDED FOR MUSCLE SPASMS 90 tablet 0  . triamcinolone cream (KENALOG) 0.1 % Apply 1 application topically 2 (two) times daily. 30 g 0  . triamterene-hydrochlorothiazide (MAXZIDE) 75-50 MG tablet TAKE 1 TABLET BY MOUTH EVERY DAY 90 tablet 0  . TRULICITY 4.09 WJ/1.9JY SOPN INJECT 1 PEN UNDER THE SKIN EVERY WEEK 2 mL 0   No current facility-administered medications for this visit.      Past Surgical History:  Procedure Laterality Date  . BREAST BIOPSY  06/22/13  . BREAST BIOPSY Left 09/12/2013   Procedure: BREAST BIOPSY WITH NEEDLE LOCALIZATION;  Surgeon: Jamesetta So, MD;  Location: [facility];  Service: General;  Laterality: Left;  . BREAST SURGERY  jan 2013  . CATARACT EXTRACTION, BILATERAL     on different occassions   . CESAREAN SECTION    . HAND LIGAMENT RECONSTRUCTION Right   . HYSTEROSCOPY W/D&C N/A 11/06/2013   Procedure: DILATATION AND CURETTAGE /HYSTEROSCOPY;  Surgeon: Jonnie Kind, MD;  Location: [facility];  Service: Gynecology;  Laterality: N/A;  . LUNG LOBECTOMY Right 1988   Fungal Infection  . POLYPECTOMY N/A 11/06/2013   Procedure: POLYPECTOMY (REMOVAL ENDOMETRIAL POLYP);  Surgeon: Jonnie Kind, MD;  Location: [facility];  Service: Gynecology;  Laterality: N/A;  . RECTOCELE REPAIR N/A 11/06/2013   Procedure: POSTERIOR REPAIR (RECTOCELE);  Surgeon: Jonnie Kind, MD;  Location: [facility];  Service: Gynecology;  Laterality: N/A;  . SHOULDER ARTHROSCOPY WITH OPEN ROTATOR CUFF REPAIR Left  07/26/2012     Allergies  Allergen Reactions  . Arimidex [Anastrozole] Other (See Comments)    Arthralgias and myalgias.  Improved with 3 week hiatus from drug.  Categorical side effect of drug class.  . Adhesive [Tape] Other (See Comments)    "red and blistered"  . Lisinopril     Dry hacking cough and tickle  . Tetracyclines & Related Other (See Comments)    unknown      Family History  Problem Relation Age of Onset  . Hypertension Mother   . Heart disease Mother   . Diabetes Mother   . Kidney disease Mother        ESRD  . Cancer Mother        Bladder  . Hypertension Father   . Hyperlipidemia Father   . Heart disease Father   . Diabetes Father   . Cancer Father        Stomach Cancer  . Diabetes Sister   . Heart disease Sister   . Alzheimer's disease Paternal Aunt   . Stroke Maternal Grandfather   . Cancer Paternal Grandfather        stomach  . Cancer Maternal Uncle        prostate  . Cancer Paternal Uncle        stomach     Social History Ms. Evitt reports that she is a non-smoker but has been exposed to tobacco smoke. she has never used smokeless tobacco. Ms. Milham reports that she drinks alcohol.   Review of Systems CONSTITUTIONAL: +fatigue HEENT: Eyes: No visual loss, blurred vision, double vision or yellow sclerae.No hearing loss, sneezing, congestion, runny nose or sore throat.  SKIN: No rash or itching.  CARDIOVASCULAR: per hpi RESPIRATORY: No shortness of breath, cough or sputum.  GASTROINTESTINAL: No anorexia, nausea, vomiting or diarrhea. No abdominal pain or blood.  GENITOURINARY: No burning on urination, no polyuria NEUROLOGICAL: +dizziness MUSCULOSKELETAL: No muscle, back pain, joint pain or stiffness.  LYMPHATICS: No enlarged nodes. No history of splenectomy.  PSYCHIATRIC: No history of depression or anxiety.  ENDOCRINOLOGIC: No reports of sweating, cold or heat intolerance. No polyuria or polydipsia.  Marland Kitchen   Physical Examination Vitals:     06/14/17 0836 06/14/17 0838  BP: (!) 146/82 (!) 146/82  Pulse: 80 81  SpO2: 97% 99%   Vitals:   06/14/17 0830  Weight: 228 lb 9.6 oz (103.7 kg)  Height: 5\' 5"  (1.651 m)    Gen: resting comfortably, no acute distress HEENT: no scleral icterus, pupils equal round and reactive, no palptable cervical adenopathy,  CV: RRR, 2/6 systolic murmur at apex, no jvd Resp: Clear to auscultation bilaterally GI: abdomen is soft, non-tender, non-distended, normal bowel sounds, no hepatosplenomegaly MSK: extremities are warm, no edema.  Skin: warm, no rash Neuro:  no focal deficits Psych: appropriate affect     Assessment and Plan  1. Dizziness/bradycardia/AV block - event monitor shows episodes of 2:1 AV block with rates down to 40s. Patient reports intermittent dizziness, fatigue - we will refer to EP for evaluation  2. Heart murmur - obtain echo    F/u 3 months      Arnoldo Lenis, M.D.

## 2017-06-14 NOTE — Patient Instructions (Signed)
Your physician recommends that you schedule a follow-up appointment in: [place]  Your physician recommends that you continue on your current medications as directed. Please refer to the Current Medication list given to you today.  You have been referred to [facility] has requested that you have an echocardiogram. Echocardiography is a painless test that uses sound waves to create images of your heart. It provides your doctor with information about the size and shape of your heart and how well your heart's chambers and valves are working. This procedure takes approximately one hour. There are no restrictions for this procedure.  Thank you for choosing [facility]!!

## 2017-06-14 NOTE — Telephone Encounter (Signed)
Pre-cert Verification for the following procedure   Echo scheduled for 06-15-17 at [facility]

## 2017-06-14 NOTE — Telephone Encounter (Signed)
[facility] lvm stating pt had an critical EKG [phone]

## 2017-06-15 ENCOUNTER — Other Ambulatory Visit: Payer: Self-pay | Admitting: *Deleted

## 2017-06-15 ENCOUNTER — Ambulatory Visit (HOSPITAL_COMMUNITY)
Admission: RE | Admit: 2017-06-15 | Discharge: 2017-06-15 | Disposition: A | Discharge status: Home / Self Care | Payer: Medicare Other | Source: Ambulatory Visit | Attending: Cardiology | Admitting: Cardiology

## 2017-06-15 DIAGNOSIS — C50912 Malignant neoplasm of unspecified site of left female breast: Secondary | ICD-10-CM | POA: Insufficient documentation

## 2017-06-15 DIAGNOSIS — I459 Conduction disorder, unspecified: Secondary | ICD-10-CM

## 2017-06-15 DIAGNOSIS — R011 Cardiac murmur, unspecified: Secondary | ICD-10-CM | POA: Insufficient documentation

## 2017-06-15 DIAGNOSIS — I080 Rheumatic disorders of both mitral and aortic valves: Secondary | ICD-10-CM | POA: Insufficient documentation

## 2017-06-15 DIAGNOSIS — E119 Type 2 diabetes mellitus without complications: Secondary | ICD-10-CM | POA: Insufficient documentation

## 2017-06-15 DIAGNOSIS — G4733 Obstructive sleep apnea (adult) (pediatric): Secondary | ICD-10-CM | POA: Diagnosis not present

## 2017-06-15 DIAGNOSIS — I119 Hypertensive heart disease without heart failure: Secondary | ICD-10-CM | POA: Insufficient documentation

## 2017-06-15 LAB — ECHOCARDIOGRAM COMPLETE
AO mean calculated velocity dopler: 106 cm/s
AOVTI: 36.3 cm
AV Area VTI index: 1.02 cm2/m2
AV Area VTI: 2.27 cm2
AV Area mean vel: 2.25 cm2
AV VEL mean LVOT/AV: 0.99
AV area mean vel ind: 1.01 cm2/m2
AV peak Index: 1.02
AV vel: 2.28
AVA: 2.28 cm2
AVG: 6 mmHg
AVLVOTPG: 10 mmHg
AVPG: 10 mmHg
AVPKVEL: 158 cm/s
Ao pk vel: 1 m/s
CHL CUP DOP CALC LVOT VTI: 36.5 cm
CHL CUP MV DEC (S): 218
CHL CUP STROKE VOLUME: 49 mL
E decel time: 218 msec
EERAT: 12.54
FS: 28 % (ref 28–44)
IVS/LV PW RATIO, ED: 0.99
LA vol index: 20.9 mL/m2
LADIAMINDEX: 1.75 cm/m2
LASIZE: 39 mm
LAVOL: 46.6 mL
LAVOLA4C: 44.8 mL
LEFT ATRIUM END SYS DIAM: 39 mm
LV E/e'average: 12.54
LV TDI E'LATERAL: 6.85
LV sys vol: 18 mL
LVDIAVOL: 67 mL (ref 46–106)
LVDIAVOLIN: 30 mL/m2
LVEEMED: 12.54
LVELAT: 6.85 cm/s
LVOT area: 2.27 cm2
LVOT diameter: 17 mm
LVOT peak VTI: 1.01 cm
LVOTPV: 158 cm/s
LVOTSV: 83 mL
LVSYSVOLIN: 8 mL/m2
Lateral S' vel: 11.2 cm/s
MV Peak grad: 3 mmHg
MV pk E vel: 85.9 m/s
MVPKAVEL: 113 m/s
PW: 11.2 mm — AB (ref 0.6–1.1)
Simpson's disk: 73
TAPSE: 20 mm
TDI e' medial: 6.64
Valve area index: 1.02

## 2017-06-15 NOTE — Progress Notes (Signed)
*  PRELIMINARY RESULTS* Echocardiogram 2D Echocardiogram has been performed.  Julie Clay 06/15/2017, 2:51 PM

## 2017-06-17 ENCOUNTER — Encounter: Payer: Self-pay | Admitting: Cardiology

## 2017-06-20 ENCOUNTER — Encounter: Payer: Self-pay | Admitting: Internal Medicine

## 2017-06-20 ENCOUNTER — Ambulatory Visit (INDEPENDENT_AMBULATORY_CARE_PROVIDER_SITE_OTHER): Payer: Medicare Other | Admitting: Internal Medicine

## 2017-06-20 VITALS — BP 150/60 | HR 95 | Ht 65.0 in | Wt 226.0 lb

## 2017-06-20 DIAGNOSIS — R001 Bradycardia, unspecified: Secondary | ICD-10-CM | POA: Diagnosis not present

## 2017-06-20 DIAGNOSIS — I4430 Unspecified atrioventricular block: Secondary | ICD-10-CM | POA: Diagnosis not present

## 2017-06-20 NOTE — Patient Instructions (Addendum)
Medication Instructions:  Your physician recommends that you continue on your current medications as directed. Please refer to the Current Medication list given to you today.  Labwork: You will have labwork drawn the day of your procedure  Testing/Procedures:  Your physician has recommended that you have a pacemaker inserted. A pacemaker is a small device that is placed under the skin of your chest or abdomen to help control abnormal heart rhythms. This device uses electrical pulses to prompt the heart to beat at a normal rate. Pacemakers are used to treat heart rhythms that are too slow. Wire (leads) are attached to the pacemaker that goes into the chambers of you heart. This is done in the hospital and usually requires and overnight stay. Please see the instruction sheet given to you today for more information.  Please arrive at the [facility] main entrance of [facility] hospital at:    Wed March 6th at 11:30am  Do not eat or drink after midnight prior to procedure You may take all of your normal medications the day of your procedure except your diabetic medication- Metformin and Glipizide Plan for one night stay You will need someone to drive you home at discharge   Follow instructions for surgical scrub the night before and the day of your procedure.   Follow-Up: Your physician recommends that you schedule a follow-up appointment in:  10-14 days with the device clinic 91 days after your procedure with Dr Caryl Comes  Any Other Special Instructions Will Be Listed Below (If Applicable).       If you need a refill on your cardiac medications before your next appointment, please call your pharmacy.

## 2017-06-20 NOTE — Progress Notes (Signed)
ELECTROPHYSIOLOGY CONSULT NOTE  Patient ID: Julie Clay, MRN: [id number], DOB/AGE: [birth year] 77 y.o. Admit date: (Not on file) Date of Consult: 06/20/2017  Primary Physician: Alycia Rossetti, MD Primary Cardiologist: Haevyn Ury is a 77 y.o. female who is being seen today for the evaluation of bradycardia at the request of Dr. Harl Bowie.    HPI Julie Clay is a 77 y.o. female  Referred because of symptomatic bradycardia.  She has noted variable heart rates over the last couple of years but this is become much more problematic over the last couple of months.  Even more so over the last couple of weeks she has noted problems with lightheadedness and exercise intolerance.  She has had presyncope but with no syncope.  There is dyspnea on exertion.  She has had no chest pain.  An ECG was obtained As Below  that demonstrated sinus rhythm with incomplete right bundle branch block and 2-1 heart block.  Right bundle branch block is new from 2015 2/19 echocardiogram LVEF normal  She has become exceedingly anxious and has not been willing to go to work where she is by herself.  She is driving only with her husband.  Date Cr  K TSH  2/19 1.03 3.6 3.42         Past medical history is notable for breast cancer on the left she also has diabetes.   Past Medical History:  Diagnosis Date  . Arthritis    Osteoarthritis  . Arthritis   . Asthma    spirometry (2011)- mild ventilary defect  . Calcification of left breast   . Cancer ([place]) 2012   Breast  . Diabetes mellitus   . Dysrhythmia    palpatations  . Fatty liver 2013   enlarged  . HSV (herpes simplex virus) infection   . Hyperlipidemia   . Hypertension   . 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 Lung nodule seen on imaging study 2013  . Lymphedema    Left arm  . Shortness of breath    with exertion  . Sleep apnea    Stop Bang score of 5. Pt has had sleep study, but was shown to  be negative for sleep apnea.  . Sliding hiatal hernia   . Ventral hernia, unspecified, without mention of obstruction or gangrene       Surgical History:  Past Surgical History:  Procedure Laterality Date  . BREAST BIOPSY  06/22/13  . BREAST BIOPSY Left 09/12/2013   Procedure: BREAST BIOPSY WITH NEEDLE LOCALIZATION;  Surgeon: Jamesetta So, MD;  Location: [facility];  Service: General;  Laterality: Left;  . BREAST SURGERY  jan 2013  . CATARACT EXTRACTION, BILATERAL     on different occassions   . CESAREAN SECTION    . HAND LIGAMENT RECONSTRUCTION Right   . HYSTEROSCOPY W/D&C N/A 11/06/2013   Procedure: DILATATION AND CURETTAGE /HYSTEROSCOPY;  Surgeon: Jonnie Kind, MD;  Location: [facility];  Service: Gynecology;  Laterality: N/A;  . LUNG LOBECTOMY Right 1988   Fungal Infection  . POLYPECTOMY N/A 11/06/2013   Procedure: POLYPECTOMY (REMOVAL ENDOMETRIAL POLYP);  Surgeon: Jonnie Kind, MD;  Location: [facility];  Service: Gynecology;  Laterality: N/A;  . RECTOCELE REPAIR N/A 11/06/2013   Procedure: POSTERIOR REPAIR (RECTOCELE);  Surgeon: Jonnie Kind, MD;  Location: [facility];  Service: Gynecology;  Laterality: N/A;  . SHOULDER ARTHROSCOPY WITH OPEN ROTATOR CUFF REPAIR  Left 07/26/2012     Home Meds: Prior to Admission medications   Medication Sig Start Date End Date Taking? Authorizing Provider  acetaminophen (TYLENOL) 500 MG tablet Take 500 mg by mouth as needed for moderate pain. Takes 2 when needed   Yes [provider]  albuterol (PROVENTIL HFA;VENTOLIN HFA) 108 (90 Base) MCG/ACT inhaler Inhale 2 puffs into the lungs every 4 (four) hours as needed. 06/02/16  Yes [place], Modena Nunnery, MD  Aloe Vera Leaf POWD Take 800 mg by mouth 2 (two) times daily.    Yes [provider]  Ascorbic Acid (VITAMIN C) 1000 MG tablet Take 1,000 mg by mouth 2 (two) times daily.   Yes [provider]  b complex vitamins tablet Take 1 tablet by mouth 2 (two) times daily.    Yes [provider]  Cholecalciferol (VITAMIN D3) 2000 UNITS TABS Take 2,000 Units by mouth 2 (two) times daily.   Yes [provider]  Docusate Calcium (STOOL SOFTENER PO) Take 1 capsule by mouth as needed (constipation).    Yes [provider]  DULERA 100-5 MCG/ACT AERO INHALE 2 PUFFS INTO THE LUNGS TWICE DAILY 06/06/17  Yes [place], Modena Nunnery, MD  Echinacea 380 MG CAPS Take 1 capsule by mouth as needed (cold/flue).   Yes [provider]  fluocinonide cream (LIDEX) 2.99 % Apply 1 application topically 2 (two) times daily as needed (poison ivy/bites/hives). 11/08/14  Yes [place], Modena Nunnery, MD  Ginkgo Biloba 120 MG CAPS Take 1 capsule by mouth daily.    Yes [provider]  glipiZIDE (GLUCOTROL) 10 MG tablet TAKE 1 TABLET BY MOUTH TWICE DAILY BEFORE A MEAL 06/06/17  Yes [place], Modena Nunnery, MD  HYDROcodone-acetaminophen (NORCO) 10-325 MG tablet Take 1 tablet by mouth every 6 (six) hours as needed.   Yes [provider]  KRILL OIL PO Take 1 capsule by mouth 2 (two) times daily.   Yes [provider]  Magnesium 400 MG TABS Take 400 mg by mouth daily.   Yes [provider]  Melatonin 3 MG TABS Take 3 mg by mouth at bedtime as needed and may repeat dose one time if needed (sleep).    Yes [provider]  metFORMIN (GLUCOPHAGE) 1000 MG tablet TAKE 1 TABLET BY MOUTH TWICE DAILY WITH A MEAL 05/03/17  Yes [place], Modena Nunnery, MD  montelukast (SINGULAIR) 10 MG tablet TAKE 1 TABLET BY MOUTH EVERY NIGHT AT BEDTIME 05/03/17  Yes [place], Modena Nunnery, MD  Multiple Vitamin (MULTIVITAMIN WITH MINERALS) TABS tablet Take 1 tablet by mouth daily.   Yes [provider]  Naftifine HCl 1 % GEL Apply 1 application topically daily as needed.   Yes [provider]  NONFORMULARY OR COMPOUNDED ITEM Apply 1 application topically 2 (two) times daily as needed. Reported on 06/10/2015   Yes [provider]  rosuvastatin (CRESTOR) 5 MG tablet TAKE 1  TABLET(5 MG) BY MOUTH DAILY 04/15/17  Yes [place], Modena Nunnery, MD  simethicone (MYLICON) 80 MG chewable tablet Chew 80 mg by mouth as needed for flatulence.    Yes [provider]  tiZANidine (ZANAFLEX) 4 MG tablet TAKE 1 TABLET BY MOUTH EVERY NIGHT AT BEDTIME AS NEEDED FOR MUSCLE SPASMS 04/15/17  Yes [place], Modena Nunnery, MD  triamcinolone cream (KENALOG) 0.1 % Apply 1 application topically 2 (two) times daily. 09/09/16  Yes Orlena Sheldon, PA-C  triamterene-hydrochlorothiazide (MAXZIDE) 75-50 MG tablet Take 0.5 tablets by mouth daily.   Yes [provider]  TRULICITY 1.61 WR/6.0AV SOPN INJECT 1 PEN UNDER THE SKIN EVERY WEEK 05/30/17  Yes , Modena Nunnery, MD    Allergies:  Allergies  Allergen Reactions  . Arimidex [Anastrozole] Other (See Comments)    Arthralgias and myalgias.  Improved with 3 week hiatus from drug.  Categorical side effect of drug class.  . Adhesive [Tape] Other (See Comments)    "red and blistered"  . Lisinopril     Dry hacking cough and tickle  . Tetracyclines & Related Other (See Comments)    unknown    Social History   Socioeconomic History  . Marital status: Married    Spouse name: Not on file  . Number of children: Not on file  . Years of education: Not on file  . Highest education level: Not on file  Social Needs  . Financial resource strain: Not on file  . Food insecurity - worry: Not on file  . Food insecurity - inability: Not on file  . Transportation needs - medical: Not on file  . Transportation needs - non-medical: Not on file  Occupational History  . 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 Smoking status: Passive Smoke Exposure - Never Smoker  . Smokeless tobacco: Never Used  . Tobacco comment: husband smokes, but pt doesn't   Substance and Sexual Activity  . Alcohol use: Yes    Alcohol/week: 0.0 oz    Comment: occasional drink twice a year  . Drug use: No  . Sexual activity: Not  Currently    Birth control/protection: Post-menopausal  Other Topics Concern  . Not on file  Social History Narrative  . Not on file     Family History  Problem Relation Age of Onset  . Hypertension Mother   . Heart disease Mother   . Diabetes Mother   . Kidney disease Mother        ESRD  . Cancer Mother        Bladder  . Hypertension Father   . Hyperlipidemia Father   . Heart disease Father   . Diabetes Father   . Cancer Father        Stomach Cancer  . Diabetes Sister   . Heart disease Sister   . Alzheimer's disease Paternal Aunt   . Stroke Maternal Grandfather   . Cancer Paternal Grandfather        stomach  . Cancer Maternal Uncle        prostate  . Cancer Paternal Uncle        stomach     ROS:  Please see the history of present illness.     All other systems reviewed and negative.    Physical Exam: Blood pressure (!) 150/60, pulse 95, height 5\' 5"  (1.651 m), weight 226 lb (102.5 kg), SpO2 98 %. General: Well developed, very obese female in no acute distress. Head: Normocephalic, atraumatic, sclera non-icteric, no xanthomas, nares are without discharge. EENT: normal  Lymph Nodes:  none Neck: Negative for carotid bruits. JVD not elevated. Back:without scoliosis kyphosis   Lungs: Clear bilaterally to auscultation without wheezes, rales, or rhonchi. Breathing is unlabored. Heart: RRR with S1 S2. 2/6 systolic murmur . No rubs, or gallops appreciated. Abdomen: Soft, non-tender, non-distended with normoactive bowel sounds. No hepatomegaly. No rebound/guarding. No obvious abdominal masses. Msk:  Strength and tone appear normal for age. Extremities: No clubbing or cyanosis. No edema.  Distal pedal pulses are 2+ and equal bilaterally.  Skin: Warm and Dry Neuro: Alert and oriented X 3. CN III-XII intact Grossly normal sensory and motor function . Psych:  Responds to questions appropriately with a normal affect.      Labs: Cardiac Enzymes No results for input(s):  CKTOTAL, CKMB, TROPONINI in the last 72 hours. CBC Lab Results  Component Value Date   WBC 9.5 06/07/2017   HGB 12.4 06/07/2017   HCT 39.6 06/07/2017   MCV 85.3 06/07/2017   PLT 214 06/07/2017   PROTIME: No results for input(s): LABPROT, INR in the last 72 hours. Chemistry No results for input(s): NA, K, CL, CO2, BUN, CREATININE, CALCIUM, PROT, BILITOT, ALKPHOS, ALT, AST, GLUCOSE in the last 168 hours.  Invalid input(s): LABALBU Lipids Lab Results  Component Value Date   CHOL 141 03/14/2017   HDL 47 (L) 03/14/2017   LDLCALC 59 09/20/2016   TRIG 181 (H) 03/14/2017   BNP No results found for: PROBNP Thyroid Function Tests: No results for input(s): TSH, T4TOTAL, T3FREE, THYROIDAB in the last 72 hours.  Invalid input(s): FREET3 Miscellaneous No results found for: DDIMER  Radiology/Studies:  Dg Chest 2 View  Result Date: 06/07/2017 CLINICAL DATA:  Arrhythmia x1 week with lightheadedness. Right lung surgery 1988. History of breast and lung cancer. EXAM: CHEST  2 VIEW COMPARISON:  01/30/2016 FINDINGS: The heart size and mediastinal contours are within normal limits. Aortic atherosclerosis without aneurysm. Scarring in the right mid lung with postsurgical deformity of the right posterolateral sixth rib. No pulmonary mass, effusion or pneumothorax. Mild chronic elevation of the right hemidiaphragm likely from volume loss prior surgery. No acute nor suspicious osseous lesions. IMPRESSION: Chronic stable scarring in the right mid lung. Aortic atherosclerosis. No active pulmonary disease. Electronically Signed   By: Ashley Royalty M.D.   On: 06/07/2017 14:42   Event Recorder personnally reviewed sinus rhythm with 2-1 and 1-1 conduction transitions without evidence of PR prolongation consistent with Mobitz 2 second-degree heart block  EKG:  05/31/17 sinus rhythm with 2-1 heart block and right bundle branch block 06/07/17 sinus rhythm with one-to-one conduction and right bundle branch  block 06/07/17 13: 57 sinus rhythm with 2-1 conduction and 1-1 conduction  Assessment and Plan:    Mobitz 2 second-degree heart block Intermittent  Lightheadedness  Breast cancer left-sided status post lumpectomy with   left-sided lymphedema   The patient has symptomatic second-degree heart block.This is occurred in the context of incomplete right bundle branch block and suspect represents  Infra Hisian disease.  It has been intermittently present for weeks in the absence of any causative medications.  Is appropriate to proceed with pacing for symptomatic relief  The benefits and risks were reviewed including but not limited to death,  perforation, infection, lead dislodgement and device malfunction.  The patient understands agrees and is willing to proceed.  Given her history of breast surgery on the left with swelling secondary to presumed lymphedema, we will anticipate right-sided device implantation  [name]

## 2017-06-20 NOTE — H&P (View-Only) (Signed)
ELECTROPHYSIOLOGY CONSULT NOTE  Patient ID: Julie Clay, MRN: [id number], DOB/AGE: 1940-07-31 77 y.o. Admit date: (Not on file) Date of Consult: 06/20/2017  Primary Physician: Alycia Rossetti, MD Primary Cardiologist: Supriya Beaston is a 77 y.o. female who is being seen today for the evaluation of bradycardia at the request of Dr. Harl Bowie.    HPI Julie Clay is a 77 y.o. female  Referred because of symptomatic bradycardia.  She has noted variable heart rates over the last couple of years but this is become much more problematic over the last couple of months.  Even more so over the last couple of weeks she has noted problems with lightheadedness and exercise intolerance.  She has had presyncope but with no syncope.  There is dyspnea on exertion.  She has had no chest pain.  An ECG was obtained As Below  that demonstrated sinus rhythm with incomplete right bundle branch block and 2-1 heart block.  Right bundle branch block is new from 2015 2/19 echocardiogram LVEF normal  She has become exceedingly anxious and has not been willing to go to work where she is by herself.  She is driving only with her husband.  Date Cr  K TSH  2/19 1.03 3.6 3.42         Past medical history is notable for breast cancer on the left she also has diabetes.   Past Medical History:  Diagnosis Date  . Arthritis    Osteoarthritis  . Arthritis   . Asthma    spirometry (2011)- mild ventilary defect  . Calcification of left breast   . Cancer ([place]) 2012   Breast  . Diabetes mellitus   . Dysrhythmia    palpatations  . Fatty liver 2013   enlarged  . HSV (herpes simplex virus) infection   . Hyperlipidemia   . Hypertension   . 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 Lung nodule seen on imaging study 2013  . Lymphedema    Left arm  . Shortness of breath    with exertion  . Sleep apnea    Stop Bang score of 5. Pt has had sleep study, but was shown to  be negative for sleep apnea.  . Sliding hiatal hernia   . Ventral hernia, unspecified, without mention of obstruction or gangrene       Surgical History:  Past Surgical History:  Procedure Laterality Date  . BREAST BIOPSY  06/22/13  . BREAST BIOPSY Left 09/12/2013   Procedure: BREAST BIOPSY WITH NEEDLE LOCALIZATION;  Surgeon: Jamesetta So, MD;  Location: [facility];  Service: General;  Laterality: Left;  . BREAST SURGERY  jan 2013  . CATARACT EXTRACTION, BILATERAL     on different occassions   . CESAREAN SECTION    . HAND LIGAMENT RECONSTRUCTION Right   . HYSTEROSCOPY W/D&C N/A 11/06/2013   Procedure: DILATATION AND CURETTAGE /HYSTEROSCOPY;  Surgeon: Jonnie Kind, MD;  Location: [facility];  Service: Gynecology;  Laterality: N/A;  . LUNG LOBECTOMY Right 1988   Fungal Infection  . POLYPECTOMY N/A 11/06/2013   Procedure: POLYPECTOMY (REMOVAL ENDOMETRIAL POLYP);  Surgeon: Jonnie Kind, MD;  Location: [facility];  Service: Gynecology;  Laterality: N/A;  . RECTOCELE REPAIR N/A 11/06/2013   Procedure: POSTERIOR REPAIR (RECTOCELE);  Surgeon: Jonnie Kind, MD;  Location: [facility];  Service: Gynecology;  Laterality: N/A;  . SHOULDER ARTHROSCOPY WITH OPEN ROTATOR CUFF REPAIR  Left 07/26/2012     Home Meds: Prior to Admission medications   Medication Sig Start Date End Date Taking? Authorizing Provider  acetaminophen (TYLENOL) 500 MG tablet Take 500 mg by mouth as needed for moderate pain. Takes 2 when needed   Yes [provider]  albuterol (PROVENTIL HFA;VENTOLIN HFA) 108 (90 Base) MCG/ACT inhaler Inhale 2 puffs into the lungs every 4 (four) hours as needed. 06/02/16  Yes [place], Modena Nunnery, MD  Aloe Vera Leaf POWD Take 800 mg by mouth 2 (two) times daily.    Yes [provider]  Ascorbic Acid (VITAMIN C) 1000 MG tablet Take 1,000 mg by mouth 2 (two) times daily.   Yes [provider]  b complex vitamins tablet Take 1 tablet by mouth 2 (two) times daily.    Yes [provider]  Cholecalciferol (VITAMIN D3) 2000 UNITS TABS Take 2,000 Units by mouth 2 (two) times daily.   Yes [provider]  Docusate Calcium (STOOL SOFTENER PO) Take 1 capsule by mouth as needed (constipation).    Yes [provider]  DULERA 100-5 MCG/ACT AERO INHALE 2 PUFFS INTO THE LUNGS TWICE DAILY 06/06/17  Yes [place], Modena Nunnery, MD  Echinacea 380 MG CAPS Take 1 capsule by mouth as needed (cold/flue).   Yes [provider]  fluocinonide cream (LIDEX) 8.52 % Apply 1 application topically 2 (two) times daily as needed (poison ivy/bites/hives). 11/08/14  Yes [place], Modena Nunnery, MD  Ginkgo Biloba 120 MG CAPS Take 1 capsule by mouth daily.    Yes [provider]  glipiZIDE (GLUCOTROL) 10 MG tablet TAKE 1 TABLET BY MOUTH TWICE DAILY BEFORE A MEAL 06/06/17  Yes [place], Modena Nunnery, MD  HYDROcodone-acetaminophen (NORCO) 10-325 MG tablet Take 1 tablet by mouth every 6 (six) hours as needed.   Yes [provider]  KRILL OIL PO Take 1 capsule by mouth 2 (two) times daily.   Yes [provider]  Magnesium 400 MG TABS Take 400 mg by mouth daily.   Yes [provider]  Melatonin 3 MG TABS Take 3 mg by mouth at bedtime as needed and may repeat dose one time if needed (sleep).    Yes [provider]  metFORMIN (GLUCOPHAGE) 1000 MG tablet TAKE 1 TABLET BY MOUTH TWICE DAILY WITH A MEAL 05/03/17  Yes [place], Modena Nunnery, MD  montelukast (SINGULAIR) 10 MG tablet TAKE 1 TABLET BY MOUTH EVERY NIGHT AT BEDTIME 05/03/17  Yes [place], Modena Nunnery, MD  Multiple Vitamin (MULTIVITAMIN WITH MINERALS) TABS tablet Take 1 tablet by mouth daily.   Yes [provider]  Naftifine HCl 1 % GEL Apply 1 application topically daily as needed.   Yes [provider]  NONFORMULARY OR COMPOUNDED ITEM Apply 1 application topically 2 (two) times daily as needed. Reported on 06/10/2015   Yes [provider]  rosuvastatin (CRESTOR) 5 MG tablet TAKE 1  TABLET(5 MG) BY MOUTH DAILY 04/15/17  Yes [place], Modena Nunnery, MD  simethicone (MYLICON) 80 MG chewable tablet Chew 80 mg by mouth as needed for flatulence.    Yes [provider]  tiZANidine (ZANAFLEX) 4 MG tablet TAKE 1 TABLET BY MOUTH EVERY NIGHT AT BEDTIME AS NEEDED FOR MUSCLE SPASMS 04/15/17  Yes [place], Modena Nunnery, MD  triamcinolone cream (KENALOG) 0.1 % Apply 1 application topically 2 (two) times daily. 09/09/16  Yes Orlena Sheldon, PA-C  triamterene-hydrochlorothiazide (MAXZIDE) 75-50 MG tablet Take 0.5 tablets by mouth daily.   Yes [provider]  TRULICITY 1.47 WG/9.5AO SOPN INJECT 1 PEN UNDER THE SKIN EVERY WEEK 05/30/17  Yes [place], Modena Nunnery, MD    Allergies:  Allergies  Allergen Reactions  . Arimidex [Anastrozole] Other (See Comments)    Arthralgias and myalgias.  Improved with 3 week hiatus from drug.  Categorical side effect of drug class.  . Adhesive [Tape] Other (See Comments)    "red and blistered"  . Lisinopril     Dry hacking cough and tickle  . Tetracyclines & Related Other (See Comments)    unknown    Social History   Socioeconomic History  . Marital status: Married    Spouse name: Not on file  . Number of children: Not on file  . Years of education: Not on file  . Highest education level: Not on file  Social Needs  . Financial resource strain: Not on file  . Food insecurity - worry: Not on file  . Food insecurity - inability: Not on file  . Transportation needs - medical: Not on file  . Transportation needs - non-medical: Not on file  Occupational History  . 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 Smoking status: Passive Smoke Exposure - Never Smoker  . Smokeless tobacco: Never Used  . Tobacco comment: husband smokes, but pt doesn't   Substance and Sexual Activity  . Alcohol use: Yes    Alcohol/week: 0.0 oz    Comment: occasional drink twice a year  . Drug use: No  . Sexual activity: Not  Currently    Birth control/protection: Post-menopausal  Other Topics Concern  . Not on file  Social History Narrative  . Not on file     Family History  Problem Relation Age of Onset  . Hypertension Mother   . Heart disease Mother   . Diabetes Mother   . Kidney disease Mother        ESRD  . Cancer Mother        Bladder  . Hypertension Father   . Hyperlipidemia Father   . Heart disease Father   . Diabetes Father   . Cancer Father        Stomach Cancer  . Diabetes Sister   . Heart disease Sister   . Alzheimer's disease Paternal Aunt   . Stroke Maternal Grandfather   . Cancer Paternal Grandfather        stomach  . Cancer Maternal Uncle        prostate  . Cancer Paternal Uncle        stomach     ROS:  Please see the history of present illness.     All other systems reviewed and negative.    Physical Exam: Blood pressure (!) 150/60, pulse 95, height 5\' 5"  (1.651 m), weight 226 lb (102.5 kg), SpO2 98 %. General: Well developed, very obese female in no acute distress. Head: Normocephalic, atraumatic, sclera non-icteric, no xanthomas, nares are without discharge. EENT: normal  Lymph Nodes:  none Neck: Negative for carotid bruits. JVD not elevated. Back:without scoliosis kyphosis   Lungs: Clear bilaterally to auscultation without wheezes, rales, or rhonchi. Breathing is unlabored. Heart: RRR with S1 S2. 2/6 systolic murmur . No rubs, or gallops appreciated. Abdomen: Soft, non-tender, non-distended with normoactive bowel sounds. No hepatomegaly. No rebound/guarding. No obvious abdominal masses. Msk:  Strength and tone appear normal for age. Extremities: No clubbing or cyanosis. No edema.  Distal pedal pulses are 2+ and equal bilaterally.  Skin: Warm and Dry Neuro: Alert and oriented X 3. CN III-XII intact Grossly normal sensory and motor function . Psych:  Responds to questions appropriately with a normal affect.      Labs: Cardiac Enzymes No results for input(s):  CKTOTAL, CKMB, TROPONINI in the last 72 hours. CBC Lab Results  Component Value Date   WBC 9.5 06/07/2017   HGB 12.4 06/07/2017   HCT 39.6 06/07/2017   MCV 85.3 06/07/2017   PLT 214 06/07/2017   PROTIME: No results for input(s): LABPROT, INR in the last 72 hours. Chemistry No results for input(s): NA, K, CL, CO2, BUN, CREATININE, CALCIUM, PROT, BILITOT, ALKPHOS, ALT, AST, GLUCOSE in the last 168 hours.  Invalid input(s): LABALBU Lipids Lab Results  Component Value Date   CHOL 141 03/14/2017   HDL 47 (L) 03/14/2017   LDLCALC 59 09/20/2016   TRIG 181 (H) 03/14/2017   BNP No results found for: PROBNP Thyroid Function Tests: No results for input(s): TSH, T4TOTAL, T3FREE, THYROIDAB in the last 72 hours.  Invalid input(s): FREET3 Miscellaneous No results found for: DDIMER  Radiology/Studies:  Dg Chest 2 View  Result Date: 06/07/2017 CLINICAL DATA:  Arrhythmia x1 week with lightheadedness. Right lung surgery 1988. History of breast and lung cancer. EXAM: CHEST  2 VIEW COMPARISON:  01/30/2016 FINDINGS: The heart size and mediastinal contours are within normal limits. Aortic atherosclerosis without aneurysm. Scarring in the right mid lung with postsurgical deformity of the right posterolateral sixth rib. No pulmonary mass, effusion or pneumothorax. Mild chronic elevation of the right hemidiaphragm likely from volume loss prior surgery. No acute nor suspicious osseous lesions. IMPRESSION: Chronic stable scarring in the right mid lung. Aortic atherosclerosis. No active pulmonary disease. Electronically Signed   By: Ashley Royalty M.D.   On: 06/07/2017 14:42   Event Recorder personnally reviewed sinus rhythm with 2-1 and 1-1 conduction transitions without evidence of PR prolongation consistent with Mobitz 2 second-degree heart block  EKG:  05/31/17 sinus rhythm with 2-1 heart block and right bundle branch block 06/07/17 sinus rhythm with one-to-one conduction and right bundle branch  block 06/07/17 13: 57 sinus rhythm with 2-1 conduction and 1-1 conduction  Assessment and Plan:    Mobitz 2 second-degree heart block Intermittent  Lightheadedness  Breast cancer left-sided status post lumpectomy with   left-sided lymphedema   The patient has symptomatic second-degree heart block.This is occurred in the context of incomplete right bundle branch block and suspect represents  Infra Hisian disease.  It has been intermittently present for weeks in the absence of any causative medications.  Is appropriate to proceed with pacing for symptomatic relief  The benefits and risks were reviewed including but not limited to death,  perforation, infection, lead dislodgement and device malfunction.  The patient understands agrees and is willing to proceed.  Given her history of breast surgery on the left with swelling secondary to presumed lymphedema, we will anticipate right-sided device implantation  [name]

## 2017-06-21 NOTE — Addendum Note (Signed)
Addended by: Bobby Rumpf C on: 06/21/2017 11:13 AM   Modules accepted: Orders

## 2017-06-22 ENCOUNTER — Ambulatory Visit (HOSPITAL_COMMUNITY)
Admission: RE | Disposition: A | Discharge status: Home / Self Care | Payer: Self-pay | Source: Ambulatory Visit | Attending: Internal Medicine

## 2017-06-22 ENCOUNTER — Ambulatory Visit (HOSPITAL_COMMUNITY)
Admission: RE | Admit: 2017-06-22 | Discharge: 2017-06-23 | Disposition: A | Discharge status: Home / Self Care | Payer: Medicare Other | Source: Ambulatory Visit | Attending: Internal Medicine | Admitting: Internal Medicine

## 2017-06-22 ENCOUNTER — Other Ambulatory Visit: Payer: Self-pay

## 2017-06-22 ENCOUNTER — Encounter (HOSPITAL_COMMUNITY): Payer: Self-pay | Admitting: General Practice

## 2017-06-22 DIAGNOSIS — Z8249 Family history of ischemic heart disease and other diseases of the circulatory system: Secondary | ICD-10-CM | POA: Insufficient documentation

## 2017-06-22 DIAGNOSIS — I4510 Unspecified right bundle-branch block: Secondary | ICD-10-CM | POA: Insufficient documentation

## 2017-06-22 DIAGNOSIS — J45909 Unspecified asthma, uncomplicated: Secondary | ICD-10-CM | POA: Diagnosis not present

## 2017-06-22 DIAGNOSIS — Z7722 Contact with and (suspected) exposure to environmental tobacco smoke (acute) (chronic): Secondary | ICD-10-CM | POA: Insufficient documentation

## 2017-06-22 DIAGNOSIS — E119 Type 2 diabetes mellitus without complications: Secondary | ICD-10-CM | POA: Diagnosis not present

## 2017-06-22 DIAGNOSIS — I441 Atrioventricular block, second degree: Secondary | ICD-10-CM

## 2017-06-22 DIAGNOSIS — G4730 Sleep apnea, unspecified: Secondary | ICD-10-CM | POA: Insufficient documentation

## 2017-06-22 DIAGNOSIS — M1990 Unspecified osteoarthritis, unspecified site: Secondary | ICD-10-CM | POA: Insufficient documentation

## 2017-06-22 DIAGNOSIS — Z7984 Long term (current) use of oral hypoglycemic drugs: Secondary | ICD-10-CM | POA: Diagnosis not present

## 2017-06-22 DIAGNOSIS — Z959 Presence of cardiac and vascular implant and graft, unspecified: Secondary | ICD-10-CM

## 2017-06-22 DIAGNOSIS — E785 Hyperlipidemia, unspecified: Secondary | ICD-10-CM | POA: Diagnosis not present

## 2017-06-22 DIAGNOSIS — I4430 Unspecified atrioventricular block: Secondary | ICD-10-CM

## 2017-06-22 DIAGNOSIS — I10 Essential (primary) hypertension: Secondary | ICD-10-CM | POA: Insufficient documentation

## 2017-06-22 HISTORY — DX: Gastro-esophageal reflux disease without esophagitis: K21.9

## 2017-06-22 HISTORY — DX: Unspecified osteoarthritis, unspecified site: M19.90

## 2017-06-22 HISTORY — DX: Type 2 diabetes mellitus without complications: E11.9

## 2017-06-22 HISTORY — DX: Malignant neoplasm of unspecified site of left female breast: C50.912

## 2017-06-22 HISTORY — DX: Unspecified chronic bronchitis: J42

## 2017-06-22 HISTORY — PX: PACEMAKER IMPLANT: EP1218

## 2017-06-22 HISTORY — DX: Enthesopathy, unspecified: M77.9

## 2017-06-22 HISTORY — DX: Chronic obstructive pulmonary disease, unspecified: J44.9

## 2017-06-22 HISTORY — DX: Atrioventricular block, second degree: I44.1

## 2017-06-22 HISTORY — PX: INSERT / REPLACE / REMOVE PACEMAKER: SUR710

## 2017-06-22 LAB — SURGICAL PCR SCREEN
MRSA, PCR: NEGATIVE
STAPHYLOCOCCUS AUREUS: NEGATIVE

## 2017-06-22 LAB — GLUCOSE, CAPILLARY
GLUCOSE-CAPILLARY: 97 mg/dL (ref 65–99)
Glucose-Capillary: 81 mg/dL (ref 65–99)
Glucose-Capillary: 117 mg/dL — ABNORMAL HIGH (ref 65–99)

## 2017-06-22 SURGERY — PACEMAKER IMPLANT

## 2017-06-22 MED ORDER — ONDANSETRON HCL 4 MG/2ML IJ SOLN: 4.0000 mg | Freq: Four times a day (QID) | INTRAMUSCULAR | Status: DC | PRN

## 2017-06-22 MED ORDER — FENTANYL CITRATE (PF) 100 MCG/2ML IJ SOLN
INTRAMUSCULAR | Status: AC
  Filled 2017-06-22: qty 2

## 2017-06-22 MED ORDER — LIDOCAINE HCL (PF) 1 % IJ SOLN
INTRAMUSCULAR | Status: AC
  Filled 2017-06-22: qty 30

## 2017-06-22 MED ORDER — SODIUM CHLORIDE 0.9 % IR SOLN
Status: AC
  Filled 2017-06-22: qty 2

## 2017-06-22 MED ORDER — ADULT MULTIVITAMIN W/MINERALS CH
1.0000 | ORAL_TABLET | Freq: Every day | ORAL | Status: DC
  Administered 2017-06-23: 1 via ORAL
  Filled 2017-06-22: qty 1

## 2017-06-22 MED ORDER — VITAMIN D3 25 MCG (1000 UNIT) PO TABS
2000.0000 [IU] | ORAL_TABLET | Freq: Two times a day (BID) | ORAL | Status: DC
  Administered 2017-06-22 – 2017-06-23 (×2): 2000 [IU] via ORAL
  Filled 2017-06-22 (×4): qty 2

## 2017-06-22 MED ORDER — ALBUTEROL SULFATE (2.5 MG/3ML) 0.083% IN NEBU: 3.0000 mL | INHALATION_SOLUTION | RESPIRATORY_TRACT | Status: DC | PRN

## 2017-06-22 MED ORDER — B COMPLEX-C PO TABS
1.0000 | ORAL_TABLET | Freq: Two times a day (BID) | ORAL | Status: DC
  Administered 2017-06-22: 22:00:00 1 via ORAL
  Filled 2017-06-22 (×2): qty 1

## 2017-06-22 MED ORDER — MIDAZOLAM HCL 5 MG/5ML IJ SOLN
INTRAMUSCULAR | Status: AC
  Filled 2017-06-22: qty 5

## 2017-06-22 MED ORDER — SIMETHICONE 80 MG PO CHEW
80.0000 mg | CHEWABLE_TABLET | ORAL | Status: DC | PRN
  Filled 2017-06-22: qty 1

## 2017-06-22 MED ORDER — HYDROCODONE-ACETAMINOPHEN 10-325 MG PO TABS
1.0000 | ORAL_TABLET | Freq: Four times a day (QID) | ORAL | Status: DC | PRN
  Administered 2017-06-22 – 2017-06-23 (×2): 1 via ORAL
  Filled 2017-06-22 (×2): qty 1

## 2017-06-22 MED ORDER — VITAMIN C 500 MG PO TABS
1000.0000 mg | ORAL_TABLET | Freq: Two times a day (BID) | ORAL | Status: DC
  Administered 2017-06-22 – 2017-06-23 (×2): 1000 mg via ORAL
  Filled 2017-06-22 (×2): qty 2

## 2017-06-22 MED ORDER — FENTANYL CITRATE (PF) 100 MCG/2ML IJ SOLN
INTRAMUSCULAR | Status: DC | PRN
  Administered 2017-06-22 (×4): 25 ug via INTRAVENOUS
  Administered 2017-06-22: 50 ug via INTRAVENOUS
  Administered 2017-06-22 (×3): 25 ug via INTRAVENOUS

## 2017-06-22 MED ORDER — SODIUM CHLORIDE 0.9 % IR SOLN
80.0000 mg | Status: AC
  Administered 2017-06-22: 80 mg

## 2017-06-22 MED ORDER — MOMETASONE FURO-FORMOTEROL FUM 100-5 MCG/ACT IN AERO
2.0000 | INHALATION_SPRAY | Freq: Two times a day (BID) | RESPIRATORY_TRACT | Status: DC
  Filled 2017-06-22: qty 8.8

## 2017-06-22 MED ORDER — SODIUM CHLORIDE 0.9 % IV SOLN
INTRAVENOUS | Status: DC
  Administered 2017-06-22: 12:00:00 via INTRAVENOUS

## 2017-06-22 MED ORDER — SODIUM CHLORIDE 0.9 % IV SOLN: INTRAVENOUS | Status: DC

## 2017-06-22 MED ORDER — TRIAMTERENE-HCTZ 75-50 MG PO TABS
0.5000 | ORAL_TABLET | Freq: Every day | ORAL | Status: DC
  Administered 2017-06-23: 0.5 via ORAL
  Filled 2017-06-22: qty 0.5

## 2017-06-22 MED ORDER — GLIPIZIDE 10 MG PO TABS
10.0000 mg | ORAL_TABLET | Freq: Two times a day (BID) | ORAL | Status: DC
  Administered 2017-06-22 – 2017-06-23 (×2): 10 mg via ORAL
  Filled 2017-06-22: qty 2
  Filled 2017-06-22 (×2): qty 1
  Filled 2017-06-22: qty 2
  Filled 2017-06-22: qty 1

## 2017-06-22 MED ORDER — ACETAMINOPHEN 325 MG PO TABS: 325.0000 mg | ORAL_TABLET | ORAL | Status: DC | PRN

## 2017-06-22 MED ORDER — GENTAMICIN SULFATE 40 MG/ML IJ SOLN
INTRAMUSCULAR | Status: AC
  Filled 2017-06-22: qty 2

## 2017-06-22 MED ORDER — ALOE VERA LEAF POWD: 800.0000 mg | Freq: Two times a day (BID) | Status: DC

## 2017-06-22 MED ORDER — HEPARIN (PORCINE) IN NACL 2-0.9 UNIT/ML-% IJ SOLN
INTRAMUSCULAR | Status: DC | PRN
  Administered 2017-06-22: 500 mL

## 2017-06-22 MED ORDER — MONTELUKAST SODIUM 10 MG PO TABS
10.0000 mg | ORAL_TABLET | Freq: Every day | ORAL | Status: DC
  Administered 2017-06-22: 10 mg via ORAL
  Filled 2017-06-22: qty 1

## 2017-06-22 MED ORDER — MUPIROCIN 2 % EX OINT
1.0000 "application " | TOPICAL_OINTMENT | Freq: Once | CUTANEOUS | Status: AC
  Administered 2017-06-22: 1 via TOPICAL

## 2017-06-22 MED ORDER — GINKGO BILOBA 120 MG PO CAPS: 1.0000 | ORAL_CAPSULE | Freq: Every day | ORAL | Status: DC

## 2017-06-22 MED ORDER — CEFAZOLIN SODIUM-DEXTROSE 2-4 GM/100ML-% IV SOLN
INTRAVENOUS | Status: AC
  Filled 2017-06-22: qty 100

## 2017-06-22 MED ORDER — MUPIROCIN 2 % EX OINT
TOPICAL_OINTMENT | CUTANEOUS | Status: AC
  Administered 2017-06-22: 1 via TOPICAL
  Filled 2017-06-22: qty 22

## 2017-06-22 MED ORDER — CEFAZOLIN SODIUM-DEXTROSE 2-4 GM/100ML-% IV SOLN
2.0000 g | INTRAVENOUS | Status: AC
  Administered 2017-06-22: 2 g via INTRAVENOUS

## 2017-06-22 MED ORDER — MAGNESIUM OXIDE 400 (241.3 MG) MG PO TABS
400.0000 mg | ORAL_TABLET | Freq: Every day | ORAL | Status: DC
  Filled 2017-06-22: qty 1

## 2017-06-22 MED ORDER — LIDOCAINE HCL (PF) 1 % IJ SOLN
INTRAMUSCULAR | Status: DC | PRN
  Administered 2017-06-22: 60 mL

## 2017-06-22 MED ORDER — CHLORHEXIDINE GLUCONATE 4 % EX LIQD
60.0000 mL | Freq: Once | CUTANEOUS | Status: DC
  Filled 2017-06-22: qty 60

## 2017-06-22 MED ORDER — ECHINACEA 380 MG PO CAPS: 1.0000 | ORAL_CAPSULE | ORAL | Status: DC | PRN

## 2017-06-22 MED ORDER — MIDAZOLAM HCL 5 MG/5ML IJ SOLN
INTRAMUSCULAR | Status: DC | PRN
  Administered 2017-06-22 (×4): 1 mg via INTRAVENOUS
  Administered 2017-06-22: 2 mg via INTRAVENOUS
  Administered 2017-06-22 (×2): 1 mg via INTRAVENOUS

## 2017-06-22 MED ORDER — METFORMIN HCL 500 MG PO TABS
1000.0000 mg | ORAL_TABLET | Freq: Two times a day (BID) | ORAL | Status: DC
  Administered 2017-06-22: 18:00:00 1000 mg via ORAL
  Filled 2017-06-22 (×2): qty 2

## 2017-06-22 MED ORDER — SODIUM CHLORIDE 0.9 % IR SOLN
Status: DC | PRN
  Administered 2017-06-22: 15:00:00

## 2017-06-22 MED ORDER — SODIUM CHLORIDE 0.9 % IV SOLN
INTRAVENOUS | Status: DC
  Administered 2017-06-22: 16:00:00 via INTRAVENOUS

## 2017-06-22 MED ORDER — ACETAMINOPHEN 500 MG PO TABS: 500.0000 mg | ORAL_TABLET | ORAL | Status: DC | PRN

## 2017-06-22 MED ORDER — CEFAZOLIN SODIUM-DEXTROSE 1-4 GM/50ML-% IV SOLN
1.0000 g | Freq: Four times a day (QID) | INTRAVENOUS | Status: AC
  Administered 2017-06-22 – 2017-06-23 (×3): 1 g via INTRAVENOUS
  Filled 2017-06-22 (×3): qty 50

## 2017-06-22 MED ORDER — MELATONIN 3 MG PO TABS
3.0000 mg | ORAL_TABLET | Freq: Every evening | ORAL | Status: DC | PRN
  Filled 2017-06-22: qty 1

## 2017-06-22 SURGICAL SUPPLY — 8 items
CABLE SURGICAL S-101-97-12 (CABLE) ×3 IMPLANT
HEMOSTAT SURGICEL 2X4 FIBR (HEMOSTASIS) ×6 IMPLANT
LEAD CAPSURE NOVUS 45CM (Lead) ×3 IMPLANT
LEAD CAPSURE NOVUS 5076-52CM (Lead) ×3 IMPLANT
PACEMAKER ASSURITY DR-RF (Pacemaker) ×3 IMPLANT
PAD DEFIB LIFELINK (PAD) ×3 IMPLANT
SHEATH CLASSIC 7F (SHEATH) ×6 IMPLANT
TRAY PACEMAKER INSERTION (PACKS) ×3 IMPLANT

## 2017-06-22 NOTE — Progress Notes (Signed)
PHARMACIST - PHYSICIAN ORDER COMMUNICATION  CONCERNING: P&T Medication Policy on Herbal Medications  DESCRIPTION:  This patient's orders for:  Aloe Vera Leaf powder, Gingklo Biloba, and  Echinacea  have been noted.  This product(s) is classified as an "herbal" or natural product. Due to a lack of definitive safety studies or FDA approval, nonstandard manufacturing practices, plus the potential risk of unknown drug-drug interactions while on inpatient medications, the Pharmacy and Therapeutics Committee does not permit the use of "herbal" or natural products of this type within [facility].   ACTION TAKEN: The pharmacy department is unable to verify this order at this time. Please reevaluate patient's clinical condition at discharge and address if the herbal or natural product(s) should be resumed at that time.   Consuello Masse, RPh Pager: [phone] 06/22/2017  4:06 PM

## 2017-06-22 NOTE — Discharge Summary (Addendum)
ELECTROPHYSIOLOGY PROCEDURE DISCHARGE SUMMARY    Patient ID: Julie Clay,  MRN: [id number], DOB/AGE: 1940-04-24 77 y.o.  Admit date: 06/22/2017 Discharge date: 06/23/17  Primary Care Physician: Alycia Rossetti, MD  Primary Cardiologist: Dr. Harl Bowie Electrophysiologist: Dr. Caryl Comes  Primary Discharge Diagnosis:  1. Symptomatic bradycardia  2. Advanced Heart block, Mobitz II  Secondary Discharge Diagnosis:  1. DM 2. HTN  Allergies  Allergen Reactions  . Arimidex [Anastrozole] Other (See Comments)    Arthralgias and myalgias.  Improved with 3 week hiatus from drug.  Categorical side effect of drug class.  . Adhesive [Tape] Other (See Comments)    "red and blistered"  . Lisinopril     Dry hacking cough and tickle  . Tetracyclines & Related Other (See Comments)    unknown     Procedures This Admission:  1.  Implantation of a SJM dual chamber PPM on 06/22/17 by Dr Caryl Comes.  The patient received a St Jude pulse generator serial number [id number]. Medtronic MRI compatible 5076 ventricular lead serial [phone] and a Medtronic MRI compatible 5076 atrial lead serial number [id number] . There were no immediate post procedure complications. 2.  CXR on 06/23/17 demonstrated no pneumothorax status post device implantation.   Brief HPI: Julie Clay is a 77 y.o. female was referred to electrophysiology in the outpatient setting for consideration of PPM implantation.  Past medical history includes HTN, HLDm breast Ca.  The patient has had symptomatic bradycardia without reversible causes identified.  Risks, benefits, and alternatives to PPM implantation were reviewed with the patient who wished to proceed.   Hospital Course:  The patient was admitted and underwent implantation of a PPM with details as outlined above.  She was monitored on telemetry overnight which demonstrated SR/V pacingintermittently.  Right chest was without hematoma, minimal ecchymosis.  The device was interrogated  and found to be functioning normally.  CXR was obtained and demonstrated no pneumothorax status post device implantation.  Wound care, arm mobility, and restrictions were reviewed with the patient.  The patient mentioned some vague chest discomfort that had been somewhat persistent more noted with inspiration, no exam findings to suggest effusion, she was examined by Dr.Marella Vanderpol without active complaints and considered stable for discharge to home if remained resolved.  The patient was re-evaluated by myself and Dr. Curt Bears later this morning and had made her observation that the telemetry box resting on her chest was the pressure she felt and resolved with it lying on her side.  She mentions wearing her bra gives her the same feeling, she was feeling well without complaint, ambulating in the room without difficulty, and felt stable to discharge to home.    Physical Exam: Vitals:   06/22/17 1956 06/23/17 0452 06/23/17 0620 06/23/17 0700  BP:   (!) 130/57 140/63  Pulse:   70 76  Resp: 18 16 15 20   Temp:   98.3 F (36.8 C) 98.3 F (36.8 C)  TempSrc:   Oral Oral  SpO2:   95% 95%  Weight:   220 lb 7.4 oz (100 kg)   Height:        GEN- The patient is well appearing, alert and oriented x 3 today.   HEENT: normocephalic, atraumatic; sclera clear, conjunctiva pink; hearing intact; oropharynx clear; neck supple, no JVP Lungs- CTA b/l, normal work of breathing.  No wheezes, rales, rhonchi Heart- RRR, no murmurs, rubs or gallops, PMI not laterally displaced GI- soft, non-tender, non-distended Extremities- no clubbing, cyanosis, or edema  MS- no significant deformity or atrophy Skin- warm and dry, no rash or lesion, right chest without hematoma, minimal ecchymosis Psych- euthymic mood, full affect Neuro- no gross deficits   Labs:   Lab Results  Component Value Date   WBC 9.5 06/07/2017   HGB 12.4 06/07/2017   HCT 39.6 06/07/2017   MCV 85.3 06/07/2017   PLT 214 06/07/2017   No results for  input(s): NA, K, CL, CO2, BUN, CREATININE, CALCIUM, PROT, BILITOT, ALKPHOS, ALT, AST, GLUCOSE in the last 168 hours.  Invalid input(s): LABALBU  Discharge Medications:  Allergies as of 06/23/2017      Reactions   Arimidex [anastrozole] Other (See Comments)   Arthralgias and myalgias.  Improved with 3 week hiatus from drug.  Categorical side effect of drug class.   Adhesive [tape] Other (See Comments)   "red and blistered"   Lisinopril    Dry hacking cough and tickle   Tetracyclines & Related Other (See Comments)   unknown      Medication List    TAKE these medications   acetaminophen 500 MG tablet Commonly known as:  TYLENOL Take 500 mg by mouth as needed for moderate pain. Takes 2 when needed   albuterol 108 (90 Base) MCG/ACT inhaler Commonly known as:  PROVENTIL HFA;VENTOLIN HFA Inhale 2 puffs into the lungs every 4 (four) hours as needed.   Aloe Vera Leaf Powd Take 800 mg by mouth 2 (two) times daily.   b complex vitamins tablet Take 1 tablet by mouth 2 (two) times daily.   DULERA 100-5 MCG/ACT Aero Generic drug:  mometasone-formoterol INHALE 2 PUFFS INTO THE LUNGS TWICE DAILY   Echinacea 380 MG Caps Take 1 capsule by mouth as needed (cold/flue).   fluocinonide cream 0.05 % Commonly known as:  LIDEX Apply 1 application topically 2 (two) times daily as needed (poison ivy/bites/hives).   Ginkgo Biloba 120 MG Caps Take 1 capsule by mouth daily.   glipiZIDE 10 MG tablet Commonly known as:  GLUCOTROL TAKE 1 TABLET BY MOUTH TWICE DAILY BEFORE A MEAL   HYDROcodone-acetaminophen 10-325 MG tablet Commonly known as:  NORCO Take 1 tablet by mouth every 6 (six) hours as needed.   KRILL OIL PO Take 1 capsule by mouth 2 (two) times daily.   Magnesium 400 MG Tabs Take 400 mg by mouth daily.   Melatonin 3 MG Tabs Take 3 mg by mouth at bedtime as needed and may repeat dose one time if needed (sleep).   metFORMIN 1000 MG tablet Commonly known as:  GLUCOPHAGE TAKE 1  TABLET BY MOUTH TWICE DAILY WITH A MEAL   montelukast 10 MG tablet Commonly known as:  SINGULAIR TAKE 1 TABLET BY MOUTH EVERY NIGHT AT BEDTIME   multivitamin with minerals Tabs tablet Take 1 tablet by mouth daily.   Naftifine HCl 1 % Gel Apply 1 application topically daily as needed.   NONFORMULARY OR COMPOUNDED ITEM Apply 1 application topically 2 (two) times daily as needed. Reported on 06/10/2015   rosuvastatin 5 MG tablet Commonly known as:  CRESTOR TAKE 1 TABLET(5 MG) BY MOUTH DAILY   simethicone 80 MG chewable tablet Commonly known as:  MYLICON Chew 80 mg by mouth as needed for flatulence.   STOOL SOFTENER PO Take 1 capsule by mouth as needed (constipation).   tiZANidine 4 MG tablet Commonly known as:  ZANAFLEX TAKE 1 TABLET BY MOUTH EVERY NIGHT AT BEDTIME AS NEEDED FOR MUSCLE SPASMS   triamcinolone cream 0.1 % Commonly known as:  KENALOG Apply 1 application topically 2 (two) times daily.   triamterene-hydrochlorothiazide 75-50 MG tablet Commonly known as:  MAXZIDE Take 0.5 tablets by mouth daily.   TRULICITY 3.15 XY/5.8PF Sopn Generic drug:  Dulaglutide INJECT 1 PEN UNDER THE SKIN EVERY WEEK   vitamin C 1000 MG tablet Take 1,000 mg by mouth 2 (two) times daily.   Vitamin D3 2000 units Tabs Take 2,000 Units by mouth 2 (two) times daily.       Disposition:  Home   Discharge Instructions    Diet - low sodium heart healthy   Complete by:  As directed    Increase activity slowly   Complete by:  As directed      Follow-up Information    [facility] Follow up on 07/06/2017.   Specialty:  Cardiology Why:  9:00AM, wound check visit Contact information: [address] [place]       Deboraha Sprang, MD Follow up on 09/23/2017.   Specialty:  Cardiology Why:  9:30 AM Contact information: [address] [address] [phone]            Duration of Discharge Encounter: Greater than 30 minutes including physician time.  Signed, Tommye Standard, PA-C 06/23/2017 11:29 AM  Post procedural pleuritic pain,  To be reevaluated later by Dr [facility]   His impression was that it was related to the telemtry box and the pt was discharged  Device funciton was normal  CXR reviewed with good lead position

## 2017-06-22 NOTE — Interval H&P Note (Signed)
History and Physical Interval Note:  06/22/2017 1:22 PM  Julie Clay  has presented today for surgery, with the diagnosis of hb  The various methods of treatment have been discussed with the patient and family. After consideration of risks, benefits and other options for treatment, the patient has consented to  Procedure(s): PACEMAKER IMPLANT (N/A) as a surgical intervention .  The patient's history has been reviewed, patient examined, no change in status, stable for surgery.  I have reviewed the patient's chart and labs.  Questions were answered to the patient's satisfaction.     [name]  No changes questions answered

## 2017-06-22 NOTE — Interval H&P Note (Signed)
History and Physical Interval Note:  06/22/2017 1:20 PM  Julie Clay  has presented today for surgery, with the diagnosis of hb  The various methods of treatment have been discussed with the patient and family. After consideration of risks, benefits and other options for treatment, the patient has consented to  Procedure(s): PACEMAKER IMPLANT (N/A) as a surgical intervention .  The patient's history has been reviewed, patient examined, no change in status, stable for surgery.  I have reviewed the patient's chart and labs.  Questions were answered to the patient's satisfaction.     Julie Clay

## 2017-06-23 ENCOUNTER — Encounter (HOSPITAL_COMMUNITY): Payer: Self-pay | Admitting: Internal Medicine

## 2017-06-23 ENCOUNTER — Ambulatory Visit (HOSPITAL_COMMUNITY): Payer: Medicare Other

## 2017-06-23 DIAGNOSIS — I10 Essential (primary) hypertension: Secondary | ICD-10-CM | POA: Diagnosis not present

## 2017-06-23 DIAGNOSIS — I441 Atrioventricular block, second degree: Secondary | ICD-10-CM

## 2017-06-23 DIAGNOSIS — J45909 Unspecified asthma, uncomplicated: Secondary | ICD-10-CM | POA: Diagnosis not present

## 2017-06-23 DIAGNOSIS — Z8249 Family history of ischemic heart disease and other diseases of the circulatory system: Secondary | ICD-10-CM | POA: Diagnosis not present

## 2017-06-23 DIAGNOSIS — Z7984 Long term (current) use of oral hypoglycemic drugs: Secondary | ICD-10-CM | POA: Diagnosis not present

## 2017-06-23 DIAGNOSIS — I4510 Unspecified right bundle-branch block: Secondary | ICD-10-CM | POA: Diagnosis not present

## 2017-06-23 DIAGNOSIS — Z7722 Contact with and (suspected) exposure to environmental tobacco smoke (acute) (chronic): Secondary | ICD-10-CM | POA: Diagnosis not present

## 2017-06-23 DIAGNOSIS — E785 Hyperlipidemia, unspecified: Secondary | ICD-10-CM | POA: Diagnosis not present

## 2017-06-23 DIAGNOSIS — G4730 Sleep apnea, unspecified: Secondary | ICD-10-CM | POA: Diagnosis not present

## 2017-06-23 DIAGNOSIS — E119 Type 2 diabetes mellitus without complications: Secondary | ICD-10-CM | POA: Diagnosis not present

## 2017-06-23 DIAGNOSIS — M1990 Unspecified osteoarthritis, unspecified site: Secondary | ICD-10-CM | POA: Diagnosis not present

## 2017-06-23 DIAGNOSIS — Z95810 Presence of automatic (implantable) cardiac defibrillator: Secondary | ICD-10-CM | POA: Diagnosis not present

## 2017-06-23 LAB — GLUCOSE, CAPILLARY: Glucose-Capillary: 83 mg/dL (ref 65–99)

## 2017-06-23 MED ORDER — YOU HAVE A PACEMAKER BOOK
Freq: Once | Status: AC
  Administered 2017-06-23: 04:00:00
  Filled 2017-06-23: qty 1

## 2017-06-23 MED ORDER — B COMPLEX-C PO TABS
1.0000 | ORAL_TABLET | Freq: Every day | ORAL | Status: DC
  Administered 2017-06-23: 1 via ORAL
  Filled 2017-06-23: qty 1

## 2017-06-23 MED ORDER — MAGNESIUM OXIDE 400 (241.3 MG) MG PO TABS
400.0000 mg | ORAL_TABLET | Freq: Every day | ORAL | Status: DC
  Filled 2017-06-23: qty 1

## 2017-06-23 MED FILL — Gentamicin Sulfate Inj 40 MG/ML: INTRAMUSCULAR | Qty: 80 | Status: AC

## 2017-06-23 MED FILL — Cefazolin Sodium-Dextrose IV Solution 2 GM/100ML-4%: INTRAVENOUS | Qty: 100 | Status: AC

## 2017-06-23 NOTE — Discharge Instructions (Signed)
Pacemaker Implantation, Adult Pacemaker implantation is a procedure to place a pacemaker inside your chest. A pacemaker is a small computer that sends electrical signals to the heart and helps your heart beat normally. A pacemaker also stores information about your heart rhythms. You may need pacemaker implantation if you:  Have a slow heartbeat (bradycardia).  Faint (syncope).  Have shortness of breath (dyspnea) due to heart problems.  The pacemaker attaches to your heart through a wire, called a lead. Sometimes just one lead is needed. Other times, there will be two leads. There are two types of pacemakers:  Transvenous pacemaker. This type is placed under the skin or muscle of your chest. The lead goes through a vein in the chest area to reach the inside of the heart.  Epicardial pacemaker. This type is placed under the skin or muscle of your chest or belly. The lead goes through your chest to the outside of the heart.  Tell a health care provider about:  Any allergies you have.  All medicines you are taking, including vitamins, herbs, eye drops, creams, and over-the-counter medicines.  Any problems you or family members have had with anesthetic medicines.  Any blood or bone disorders you have.  Any surgeries you have had.  Any medical conditions you have.  Whether you are pregnant or may be pregnant. What are the risks? Generally, this is a safe procedure. However, problems may occur, including:  Infection.  Bleeding.  Failure of the pacemaker or the lead.  Collapse of a lung or bleeding into a lung.  Blood clot inside a blood vessel with a lead.  Damage to the heart.  Infection inside the heart (endocarditis).  Allergic reactions to medicines.  What happens before the procedure? Staying hydrated Follow instructions from your health care provider about hydration, which may include:  Up to 2 hours before the procedure - you may continue to drink clear  liquids, such as water, clear fruit juice, black coffee, and plain tea.  Eating and drinking restrictions Follow instructions from your health care provider about eating and drinking, which may include:  8 hours before the procedure - stop eating heavy meals or foods such as meat, fried foods, or fatty foods.  6 hours before the procedure - stop eating light meals or foods, such as toast or cereal.  6 hours before the procedure - stop drinking milk or drinks that contain milk.  2 hours before the procedure - stop drinking clear liquids.  Medicines  Ask your health care provider about: ? Changing or stopping your regular medicines. This is especially important if you are taking diabetes medicines or blood thinners. ? Taking medicines such as aspirin and ibuprofen. These medicines can thin your blood. Do not take these medicines before your procedure if your health care provider instructs you not to.  You may be given antibiotic medicine to help prevent infection. General instructions  You will have a heart evaluation. This may include an electrocardiogram (ECG), chest X-ray, and heart imaging (echocardiogram,  or echo) tests.  You will have blood tests.  Do not use any products that contain nicotine or tobacco, such as cigarettes and e-cigarettes. If you need help quitting, ask your health care provider.  Plan to have someone take you home from the hospital or clinic.  If you will be going home right after the procedure, plan to have someone with you for 24 hours.  Ask your health care provider how your surgical site will be marked  or identified. What happens during the procedure?  To reduce your risk of infection: ? Your health care team will wash or sanitize their hands. ? Your skin will be washed with soap. ? Hair may be removed from the surgical area.  An IV tube will be inserted into one of your veins.  You will be given one or more of the following: ? A medicine to  help you relax (sedative). ? A medicine to numb the area (local anesthetic). ? A medicine to make you fall asleep (general anesthetic).  If you are getting a transvenous pacemaker: ? An incision will be made in your upper chest. ? A pocket will be made for the pacemaker. It may be placed under the skin or between layers of muscle. ? The lead will be inserted into a blood vessel that returns to the heart. ? While X-rays are taken by an imaging machine (fluoroscopy), the lead will be advanced through the vein to the inside of your heart. ? The other end of the lead will be tunneled under the skin and attached to the pacemaker.  If you are getting an epicardial pacemaker: ? An incision will be made near your ribs or breastbone (sternum) for the lead. ? The lead will be attached to the outside of your heart. ? Another incision will be made in your chest or upper belly to create a pocket for the pacemaker. ? The free end of the lead will be tunneled under the skin and attached to the pacemaker.  The transvenous or epicardial pacemaker will be tested. Imaging studies may be done to check the lead position.  The incisions will be closed with stitches (sutures), adhesive strips, or skin glue.  Bandages (dressing) will be placed over the incisions. The procedure may vary among health care providers and hospitals. What happens after the procedure?  Your blood pressure, heart rate, breathing rate, and blood oxygen level will be monitored until the medicines you were given have worn off.  You will be given antibiotics and pain medicine.  ECG and chest x-rays will be done.  You will wear a continuous type of ECG (Holter monitor) to check your heart rhythm.  Your health care provider willprogram the pacemaker.  Do not drive for 24 hours if you received a sedative. This information is not intended to replace advice given to you by your health care provider. Make sure you discuss any questions  you have with your health care provider. Document Released: 03/26/2002 Document Revised: 10/24/2015 Document Reviewed: 09/17/2015 Elsevier Interactive Patient Education  2018 [facility] [facility] Discharge Instructions for  Pacemaker/Defibrillator Patients  Activity No heavy lifting or vigorous activity with your left/right arm for 6 to 8 weeks.  Do not raise your left/right arm above your head for one week.  Gradually raise your affected arm as drawn below.              06/26/17                    06/27/17                    06/28/17                   06/29/17 __  NO DRIVING for  1 week  ; you may begin driving on  5/39/76  .  WOUND CARE - Keep the wound area clean and dry.  Do not get this area  wet for one week. No showers for one week; you may shower on   06/29/17  . - The tape/steri-strips on your wound will fall off; do not pull them off.  No bandage is needed on the site.  DO  NOT apply any creams, oils, or ointments to the wound area. - If you notice any drainage or discharge from the wound, any swelling or bruising at the site, or you develop a fever > 101? F after you are discharged home, call the office at once.  Special Instructions - You are still able to use cellular telephones; use the ear opposite the side where you have your pacemaker/defibrillator.  Avoid carrying your cellular phone near your device. - When traveling through airports, show security personnel your identification card to avoid being screened in the metal detectors.  Ask the security personnel to use the hand wand. - Avoid arc welding equipment, MRI testing (magnetic resonance imaging), TENS units (transcutaneous nerve stimulators).  Call the office for questions about other devices. - Avoid electrical appliances that are in poor condition or are not properly grounded. - Microwave ovens are safe to be near or to operate.  Additional information for defibrillator patients should your device go  off: - If your device goes off ONCE and you feel fine afterward, notify the device clinic nurses. - If your device goes off ONCE and you do not feel well afterward, call 911. - If your device goes off TWICE, call 911. - If your device goes off THREE times in one day, call 911.  DO NOT DRIVE YOURSELF OR A FAMILY MEMBER WITH A DEFIBRILLATOR TO THE HOSPITAL--CALL 911.

## 2017-06-27 ENCOUNTER — Other Ambulatory Visit: Payer: Self-pay | Admitting: Family Medicine

## 2017-07-01 ENCOUNTER — Other Ambulatory Visit: Payer: Self-pay | Admitting: *Deleted

## 2017-07-01 DIAGNOSIS — I10 Essential (primary) hypertension: Secondary | ICD-10-CM

## 2017-07-01 DIAGNOSIS — E782 Mixed hyperlipidemia: Secondary | ICD-10-CM

## 2017-07-01 DIAGNOSIS — E1141 Type 2 diabetes mellitus with diabetic mononeuropathy: Secondary | ICD-10-CM

## 2017-07-01 DIAGNOSIS — N182 Chronic kidney disease, stage 2 (mild): Secondary | ICD-10-CM

## 2017-07-06 ENCOUNTER — Ambulatory Visit (INDEPENDENT_AMBULATORY_CARE_PROVIDER_SITE_OTHER): Payer: Medicare Other | Admitting: *Deleted

## 2017-07-06 DIAGNOSIS — I441 Atrioventricular block, second degree: Secondary | ICD-10-CM

## 2017-07-06 LAB — CUP PACEART INCLINIC DEVICE CHECK
Battery Remaining Longevity: 128 mo
Battery Voltage: 3.14 V
Brady Statistic RA Percent Paced: 2.5 %
Implantable Lead Implant Date: 20190306
Implantable Lead Location: 753859
Implantable Lead Model: 5076
Lead Channel Impedance Value: 425 Ohm
Lead Channel Pacing Threshold Amplitude: 0.75 V
Lead Channel Pacing Threshold Amplitude: 0.75 V
Lead Channel Pacing Threshold Pulse Width: 0.5 ms
Lead Channel Pacing Threshold Pulse Width: 0.5 ms
Lead Channel Sensing Intrinsic Amplitude: 3.6 mV
Lead Channel Setting Pacing Amplitude: 3.5 V
Lead Channel Setting Pacing Pulse Width: 0.5 ms
Lead Channel Setting Sensing Sensitivity: 2 mV
MDC IDC LEAD IMPLANT DT: 20190306
MDC IDC LEAD LOCATION: 753860
MDC IDC MSMT LEADCHNL RA PACING THRESHOLD PULSEWIDTH: 0.5 ms
MDC IDC MSMT LEADCHNL RV IMPEDANCE VALUE: 525 Ohm
MDC IDC MSMT LEADCHNL RV PACING THRESHOLD AMPLITUDE: 0.5 V
MDC IDC MSMT LEADCHNL RV PACING THRESHOLD AMPLITUDE: 0.5 V
MDC IDC MSMT LEADCHNL RV PACING THRESHOLD PULSEWIDTH: 0.5 ms
MDC IDC MSMT LEADCHNL RV SENSING INTR AMPL: 11 mV
MDC IDC PG IMPLANT DT: 20190306
MDC IDC PG SERIAL: 9001027
MDC IDC SESS DTM: 20190320092856
MDC IDC SET LEADCHNL RA PACING AMPLITUDE: 3.5 V
MDC IDC STAT BRADY RV PERCENT PACED: 10 %

## 2017-07-06 NOTE — Progress Notes (Signed)
Wound check appointment. Dermabond removed. Wound without redness or edema. Incision edges approximated, wound well healed. Normal device function. Thresholds, sensing, and impedances consistent with implant measurements. Device programmed at 3.5V/auto capture programmed on for extra safety margin until 3 month visit. Histogram distribution appropriate for patient and level of activity. AMS~ AT longest 26min30sec. No high ventricular rates noted. Patient educated about wound care, arm mobility, lifting restrictions. ROV 09/23/2017 w/ SK

## 2017-07-07 ENCOUNTER — Ambulatory Visit (INDEPENDENT_AMBULATORY_CARE_PROVIDER_SITE_OTHER): Payer: Medicare Other | Admitting: Family Medicine

## 2017-07-07 ENCOUNTER — Encounter: Payer: Self-pay | Admitting: Family Medicine

## 2017-07-07 VITALS — BP 130/60 | HR 88 | Temp 98.1°F | Resp 18 | Ht 65.0 in | Wt 226.0 lb

## 2017-07-07 DIAGNOSIS — L299 Pruritus, unspecified: Secondary | ICD-10-CM

## 2017-07-07 MED ORDER — PREDNISONE 20 MG PO TABS: ORAL_TABLET | ORAL | 0 refills | Status: DC

## 2017-07-07 NOTE — Progress Notes (Signed)
Subjective:    Patient ID: Julie Clay, female    DOB: [birth year], 77 y.o.   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 Caryl Comes.  Starting 1 week after surgery, the patient developed diffuse itching over the upper right chest near the insertion site.  This includes the anterior right shoulder, the upper right breast.  It also involves the right side of her back over her scapula all the way to the midline near the thoracic spine.  She states that the skin is itching constantly although there is no visible rash.  She states that she will occasionally see a hive that will come and go however there is no visible rash today.  There is no evidence of a rash underneath her right breast.  There is no erythema or vesicular lesions.  She is tried Benadryl with no relief.  She believes she is having some type of allergic reaction.  No visible sign on shingles.   Past Medical History:  Diagnosis Date  . Asthma    spirometry (2011)- mild ventilary defect  . Bone spur    "heels, knees, left shoulder" (06/22/2017)  . Breast cancer, left breast ([place]) dx'd 03/2011  . Calcification of left breast   . Chronic bronchitis ([place])   . COPD (chronic obstructive pulmonary disease) ([place])   . Dysrhythmia    palpatations  . Fatty liver 2013   enlarged  . GERD (gastroesophageal reflux disease)   . Heart murmur    "very slight one" (06/22/2017)  . HSV (herpes simplex virus) infection   . Hyperlipidemia   . Hypertension   . 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 Lung nodule seen on imaging study 2013  . Lymphedema    Left arm  . Osteoarthritis    "all over" (06/22/2017)  . Pneumonia    "maybe once or twice" (06/22/2017)  . Presence of permanent cardiac pacemaker   . Shortness of breath    with exertion  . Sleep apnea    Stop Bang score of 5. Pt has had sleep study, but was shown to be negative for sleep apnea.  . Sliding  hiatal hernia   . Type II diabetes mellitus ([place])   . Ventral hernia, unspecified, without mention of obstruction or gangrene    Past Surgical History:  Procedure Laterality Date  . BREAST BIOPSY Left 04/2011  . BREAST BIOPSY Left 06/22/13  . BREAST BIOPSY Left 09/12/2013   Procedure: BREAST BIOPSY WITH NEEDLE LOCALIZATION;  Surgeon: Jamesetta So, MD;  Location: [facility];  Service: General;  Laterality: Left;  . BREAST LUMPECTOMY Left 04/2011 X 2  . CATARACT EXTRACTION W/ INTRAOCULAR LENS  IMPLANT, BILATERAL Bilateral   . CATARACT EXTRACTION, BILATERAL     on different occassions   . [place]  . DILATION AND CURETTAGE OF UTERUS  11/06/2013  . HAND LIGAMENT RECONSTRUCTION Right ~ 2010  . HYSTEROSCOPY W/D&C N/A 11/06/2013   Procedure: DILATATION AND CURETTAGE /HYSTEROSCOPY;  Surgeon: Jonnie Kind, MD;  Location: [facility];  Service: Gynecology;  Laterality: N/A;  . INSERT / REPLACE / REMOVE PACEMAKER  06/22/2017  . LUNG LOBECTOMY Right 1988   Fungal Infection  . PACEMAKER IMPLANT N/A 06/22/2017   Procedure: PACEMAKER IMPLANT;  Surgeon: Deboraha Sprang, MD;  Location: [facility];  Service: Cardiovascular;  Laterality: N/A;  . POLYPECTOMY N/A 11/06/2013   Procedure:  POLYPECTOMY (REMOVAL ENDOMETRIAL POLYP);  Surgeon: Jonnie Kind, MD;  Location: [facility];  Service: Gynecology;  Laterality: N/A;  . RECTOCELE REPAIR N/A 11/06/2013   Procedure: POSTERIOR REPAIR (RECTOCELE);  Surgeon: Jonnie Kind, MD;  Location: [facility];  Service: Gynecology;  Laterality: N/A;  . SHOULDER ARTHROSCOPY WITH OPEN ROTATOR CUFF REPAIR Left 07/26/2012  . TONSILLECTOMY  1949  . TUBAL LIGATION     Current Outpatient Medications on File Prior to Visit  Medication Sig Dispense Refill  . acetaminophen (TYLENOL) 500 MG tablet Take 500 mg by mouth as needed for moderate pain. Takes 2 when needed    . albuterol (PROVENTIL HFA;VENTOLIN HFA) 108 (90 Base) MCG/ACT inhaler Inhale 2 puffs into the lungs every  4 (four) hours as needed. 6.7 g 6  . Aloe Vera Leaf POWD Take 800 mg by mouth 2 (two) times daily.     . Ascorbic Acid (VITAMIN C) 1000 MG tablet Take 1,000 mg by mouth 2 (two) times daily.    Marland Kitchen b complex vitamins tablet Take 1 tablet by mouth 2 (two) times daily.     . Cholecalciferol (VITAMIN D3) 2000 UNITS TABS Take 2,000 Units by mouth 2 (two) times daily.    Mariane Baumgarten Calcium (STOOL SOFTENER PO) Take 1 capsule by mouth as needed (constipation).     . DULERA 100-5 MCG/ACT AERO INHALE 2 PUFFS INTO THE LUNGS TWICE DAILY 13 g 0  . Echinacea 380 MG CAPS Take 1 capsule by mouth as needed (cold/flue).    . fluocinonide cream (LIDEX) 4.65 % Apply 1 application topically 2 (two) times daily as needed (poison ivy/bites/hives). 30 g 11  . Ginkgo Biloba 120 MG CAPS Take 1 capsule by mouth daily.     Marland Kitchen glipiZIDE (GLUCOTROL) 10 MG tablet TAKE 1 TABLET BY MOUTH TWICE DAILY BEFORE A MEAL 180 tablet 0  . HYDROcodone-acetaminophen (NORCO) 10-325 MG tablet Take 1 tablet by mouth every 6 (six) hours as needed.    Marland Kitchen KRILL OIL PO Take 1 capsule by mouth 2 (two) times daily.    . Melatonin 3 MG TABS Take 3 mg by mouth at bedtime as needed and may repeat dose one time if needed (sleep).     . metFORMIN (GLUCOPHAGE) 1000 MG tablet TAKE 1 TABLET BY MOUTH TWICE DAILY WITH A MEAL 180 tablet 0  . montelukast (SINGULAIR) 10 MG tablet TAKE 1 TABLET BY MOUTH EVERY NIGHT AT BEDTIME 90 tablet 0  . Multiple Vitamin (MULTIVITAMIN WITH MINERALS) TABS tablet Take 1 tablet by mouth daily.    . Naftifine HCl 1 % GEL Apply 1 application topically daily as needed.    . NONFORMULARY OR COMPOUNDED ITEM Apply 1 application topically 2 (two) times daily as needed. Reported on 06/10/2015    . rosuvastatin (CRESTOR) 5 MG tablet TAKE 1 TABLET(5 MG) BY MOUTH DAILY 90 tablet 0  . simethicone (MYLICON) 80 MG chewable tablet Chew 80 mg by mouth as needed for flatulence.     Marland Kitchen tiZANidine (ZANAFLEX) 4 MG tablet TAKE 1 TABLET BY MOUTH EVERY  NIGHT AT BEDTIME AS NEEDED FOR MUSCLE SPASMS 90 tablet 0  . triamcinolone cream (KENALOG) 0.1 % Apply 1 application topically 2 (two) times daily. 30 g 0  . triamterene-hydrochlorothiazide (MAXZIDE) 75-50 MG tablet Take 0.5 tablets by mouth daily.    . TRULICITY 0.35 WS/5.6CL SOPN INJECT 1 PEN UNDER THE SKIN EVERY WEEK 2 mL 0   No current facility-administered medications on file prior to visit.  Allergies  Allergen Reactions  . Arimidex [Anastrozole] Other (See Comments)    Arthralgias and myalgias.  Improved with 3 week hiatus from drug.  Categorical side effect of drug class.  . Adhesive [Tape] Other (See Comments)    "red and blistered"  . Lisinopril     Dry hacking cough and tickle  . Tetracyclines & Related Other (See Comments)    unknown   Social History   Socioeconomic History  . Marital status: Married    Spouse name: Not on file  . Number of children: Not on file  . Years of education: Not on file  . Highest education level: Not on file  Occupational History  . Occupation: retired     Comment: Product manager for [employer]  Social Needs  . Financial resource strain: Not on file  . Food insecurity:    Worry: Not on file    Inability: Not on file  . Transportation needs:    Medical: Not on file    Non-medical: Not on file  Tobacco Use  . Smoking status: Never Smoker  . Smokeless tobacco: Never Used  Substance and Sexual Activity  . Alcohol use: Yes    Alcohol/week: 0.0 oz    Comment: 06/22/2017 "might have a drink twice a year"  . Drug use: No  . Sexual activity: Not Currently    Birth control/protection: Post-menopausal  Lifestyle  . Physical activity:    Days per week: Not on file    Minutes per session: Not on file  . Stress: Not on file  Relationships  . Social connections:    Talks on phone: Not on file    Gets together: Not on file    Attends religious service: Not on file    Active member of club or organization: Not on file    Attends  meetings of clubs or organizations: Not on file    Relationship status: Not on file  . Intimate partner violence:    Fear of current or ex partner: Not on file    Emotionally abused: Not on file    Physically abused: Not on file    Forced sexual activity: Not on file  Other Topics Concern  . Not on file  Social History Narrative  . Not on file     Review of Systems  All other systems reviewed and are negative.      Objective:   Physical Exam  Constitutional: She appears well-developed and well-nourished. No distress.  Cardiovascular: Normal rate, regular rhythm and normal heart sounds.  Pulmonary/Chest: Effort normal and breath sounds normal. No respiratory distress. She has no wheezes. She has no rales.        Skin: No rash noted. She is not diaphoretic. No erythema.  Vitals reviewed. The area of itching is outlined in red on the diagrams        Assessment & Plan:  Pruritus  Given the location at the previous surgical site, it is possible the patient is having some type of allergic response to the foreign device, or possibly adhesives or surgical cleansers use during the surgery.  However there is no visible rash.  It is also possible that this is neuropathic.  We discussed options including a prednisone taper pack which the patient requests versus trying gabapentin or hydroxyzine.  We weighed the risk and benefits particular given her diabetes.  Ultimately the patient would like to try prednisone taper pack which she has used in the past for similar  itching episodes and follow-up with her PCP next week.  If the itching persists, consider neuropathic itching as a cause and try gabapentin

## 2017-07-08 ENCOUNTER — Other Ambulatory Visit: Payer: Self-pay | Admitting: Family Medicine

## 2017-07-08 DIAGNOSIS — E1141 Type 2 diabetes mellitus with diabetic mononeuropathy: Secondary | ICD-10-CM | POA: Diagnosis not present

## 2017-07-08 DIAGNOSIS — N182 Chronic kidney disease, stage 2 (mild): Secondary | ICD-10-CM | POA: Diagnosis not present

## 2017-07-08 DIAGNOSIS — I10 Essential (primary) hypertension: Secondary | ICD-10-CM | POA: Diagnosis not present

## 2017-07-08 DIAGNOSIS — E782 Mixed hyperlipidemia: Secondary | ICD-10-CM | POA: Diagnosis not present

## 2017-07-09 LAB — COMPLETE METABOLIC PANEL WITH GFR
AG RATIO: 1.5 (calc) (ref 1.0–2.5)
ALKALINE PHOSPHATASE (APISO): 47 U/L (ref 33–130)
ALT: 19 U/L (ref 6–29)
AST: 22 U/L (ref 10–35)
Albumin: 4 g/dL (ref 3.6–5.1)
BILIRUBIN TOTAL: 0.4 mg/dL (ref 0.2–1.2)
BUN/Creatinine Ratio: 27 (calc) — ABNORMAL HIGH (ref 6–22)
BUN: 26 mg/dL — ABNORMAL HIGH (ref 7–25)
CALCIUM: 9.9 mg/dL (ref 8.6–10.4)
CHLORIDE: 102 mmol/L (ref 98–110)
CO2: 28 mmol/L (ref 20–32)
Creat: 0.96 mg/dL — ABNORMAL HIGH (ref 0.60–0.93)
GFR, EST NON AFRICAN AMERICAN: 58 mL/min/{1.73_m2} — AB (ref >=60)
GFR, Est African American: 68 mL/min/{1.73_m2} (ref >=60)
GLOBULIN: 2.7 g/dL (ref 1.9–3.7)
Glucose, Bld: 110 mg/dL — ABNORMAL HIGH (ref 65–99)
POTASSIUM: 4.4 mmol/L (ref 3.5–5.3)
SODIUM: 139 mmol/L (ref 135–146)
Total Protein: 6.7 g/dL (ref 6.1–8.1)

## 2017-07-09 LAB — CBC WITH DIFFERENTIAL/PLATELET
BASOS ABS: 31 {cells}/uL (ref 0–200)
BASOS PCT: 0.2 %
EOS ABS: 15 {cells}/uL (ref 15–500)
Eosinophils Relative: 0.1 %
HCT: 36.9 % (ref 35.0–45.0)
Hemoglobin: 12.2 g/dL (ref 11.7–15.5)
Lymphs Abs: 2710 cells/uL (ref 850–3900)
MCH: 26.7 pg — ABNORMAL LOW (ref 27.0–33.0)
MCHC: 33.1 g/dL (ref 32.0–36.0)
MCV: 80.7 fL (ref 80.0–100.0)
MONOS PCT: 5.6 %
MPV: 10.8 fL (ref 7.5–12.5)
NEUTROS ABS: 11781 {cells}/uL — AB (ref 1500–7800)
NEUTROS PCT: 76.5 %
PLATELETS: 284 10*3/uL (ref 140–400)
RBC: 4.57 10*6/uL (ref 3.80–5.10)
RDW: 13.3 % (ref 11.0–15.0)
Total Lymphocyte: 17.6 %
WBC mixed population: 862 cells/uL (ref 200–950)
WBC: 15.4 10*3/uL — ABNORMAL HIGH (ref 3.8–10.8)

## 2017-07-09 LAB — HEMOGLOBIN A1C
EAG (MMOL/L): 7.7 (calc)
Hgb A1c MFr Bld: 6.5 % of total Hgb — ABNORMAL HIGH (ref <5.7)
MEAN PLASMA GLUCOSE: 140 (calc)

## 2017-07-09 LAB — LIPID PANEL
CHOL/HDL RATIO: 2.8 (calc) (ref <5.0)
CHOLESTEROL: 139 mg/dL (ref <200)
HDL: 50 mg/dL — AB (ref >50)
LDL CHOLESTEROL (CALC): 65 mg/dL
NON-HDL CHOLESTEROL (CALC): 89 mg/dL (ref <130)
TRIGLYCERIDES: 159 mg/dL — AB (ref <150)

## 2017-07-13 ENCOUNTER — Other Ambulatory Visit: Payer: Self-pay

## 2017-07-13 ENCOUNTER — Ambulatory Visit (INDEPENDENT_AMBULATORY_CARE_PROVIDER_SITE_OTHER): Payer: Medicare Other | Admitting: Family Medicine

## 2017-07-13 ENCOUNTER — Encounter: Payer: Self-pay | Admitting: Family Medicine

## 2017-07-13 VITALS — BP 134/62 | HR 72 | Temp 98.7°F | Resp 12 | Ht 65.0 in | Wt 226.0 lb

## 2017-07-13 DIAGNOSIS — Z950 Presence of cardiac pacemaker: Secondary | ICD-10-CM | POA: Diagnosis not present

## 2017-07-13 DIAGNOSIS — E782 Mixed hyperlipidemia: Secondary | ICD-10-CM

## 2017-07-13 DIAGNOSIS — N182 Chronic kidney disease, stage 2 (mild): Secondary | ICD-10-CM | POA: Diagnosis not present

## 2017-07-13 DIAGNOSIS — I10 Essential (primary) hypertension: Secondary | ICD-10-CM

## 2017-07-13 DIAGNOSIS — E1143 Type 2 diabetes mellitus with diabetic autonomic (poly)neuropathy: Secondary | ICD-10-CM | POA: Diagnosis not present

## 2017-07-13 MED ORDER — MOMETASONE FURO-FORMOTEROL FUM 100-5 MCG/ACT IN AERO: 2.0000 | INHALATION_SPRAY | Freq: Two times a day (BID) | RESPIRATORY_TRACT | 6 refills | Status: DC

## 2017-07-13 NOTE — Patient Instructions (Signed)
F/U 4 months  

## 2017-07-13 NOTE — Progress Notes (Signed)
   Subjective:    Patient ID: Julie Clay, female    DOB: January 30, 1941, 77 y.o.   MRN: [id number]  Patient presents for Follow-up (is not fasting)  Here to follow-up chronic medical problems.  At her last visit she was referred to cardiology secondary to bradycardia and hypotension, heart block..  She now has pacemaker  she did break out in hives seen last week, not sure if it was the sutures as she has reacted to these before or a topical that was placed on her incision  Diabetes mellitus-recent A1c from her fasting labs show good control at 6.5% she is taking Trulicity andmetformin, due for  Urine micro   Hyperlipidemuia- on Crestor LDL 68   Eye appt- My eye doctor- Dr. Jorja Loa   No new concerns  Review Of Systems:  GEN- denies fatigue, fever, weight loss,weakness, recent illness HEENT- denies eye drainage, change in vision, nasal discharge, CVS- denies chest pain, palpitations RESP- denies SOB, cough, wheeze ABD- denies N/V, change in stools, abd pain GU- denies dysuria, hematuria, dribbling, incontinence MSK- denies joint pain, muscle aches, injury Neuro- denies headache, dizziness, syncope, seizure activity       Objective:    BP 134/62   Pulse 72   Temp 98.7 F (37.1 C) (Oral)   Resp 12   Ht 5\' 5"  (1.651 m)   Wt 226 lb (102.5 kg)   SpO2 97%   BMI 37.61 kg/m  GEN- NAD, alert and oriented x3 HEENT- PERRL, EOMI, non injected sclera, pink conjunctiva, MMM, oropharynx clear Neck- Supple, no thyromegaly CVS- RRR, no murmur, pacer in place  RESP-CTAB ABD-NABS,soft,NT,ND EXT- No edema Pulses- Radial, DP- 2+        Assessment & Plan:      Problem List Items Addressed This Visit      Unprioritized   Morbidly obese ([place])   Pacemaker    Overall doing well f/u cardiology      Hyperlipidemia    LDL at goal, continue statin drug       Essential hypertension, benign    Well controlled no changes       DM (diabetes mellitus) ([place]) - Primary    Diabetes  has been well controlled No change to medication      Relevant Orders   Microalbumin / creatinine urine ratio (Completed)   CKD (chronic kidney disease), stage II    Renal function is stable         Note: This dictation was prepared with Dragon dictation along with smaller phrase technology. Any transcriptional errors that result from this process are unintentional.

## 2017-07-14 ENCOUNTER — Encounter: Payer: Self-pay | Admitting: *Deleted

## 2017-07-14 ENCOUNTER — Encounter: Payer: Self-pay | Admitting: Family Medicine

## 2017-07-14 LAB — MICROALBUMIN / CREATININE URINE RATIO
CREATININE, URINE: 72 mg/dL (ref 20–275)
Microalb Creat Ratio: 4 mcg/mg creat (ref <30)
Microalb, Ur: 0.3 mg/dL

## 2017-07-14 NOTE — Assessment & Plan Note (Signed)
Renal function is stable.

## 2017-07-14 NOTE — Assessment & Plan Note (Signed)
LDL at goal, continue statin drug

## 2017-07-14 NOTE — Assessment & Plan Note (Signed)
Overall doing well f/u cardiology

## 2017-07-14 NOTE — Assessment & Plan Note (Signed)
Diabetes has been well controlled No change to medication

## 2017-07-14 NOTE — Assessment & Plan Note (Signed)
Well controlled no changes 

## 2017-07-18 ENCOUNTER — Other Ambulatory Visit: Payer: Self-pay | Admitting: Family Medicine

## 2017-07-18 NOTE — Telephone Encounter (Signed)
Ok to refill 

## 2017-07-26 ENCOUNTER — Other Ambulatory Visit: Payer: Self-pay | Admitting: Family Medicine

## 2017-08-14 ENCOUNTER — Other Ambulatory Visit: Payer: Self-pay | Admitting: Family Medicine

## 2017-08-16 ENCOUNTER — Encounter (HOSPITAL_COMMUNITY): Payer: Medicare Other

## 2017-08-17 ENCOUNTER — Ambulatory Visit (INDEPENDENT_AMBULATORY_CARE_PROVIDER_SITE_OTHER): Payer: Medicare Other | Admitting: Physician Assistant

## 2017-08-17 ENCOUNTER — Other Ambulatory Visit: Payer: Self-pay

## 2017-08-17 ENCOUNTER — Encounter: Payer: Self-pay | Admitting: Physician Assistant

## 2017-08-17 VITALS — BP 132/60 | HR 51 | Temp 98.3°F | Resp 14 | Ht 65.0 in | Wt 226.4 lb

## 2017-08-17 DIAGNOSIS — W57XXXA Bitten or stung by nonvenomous insect and other nonvenomous arthropods, initial encounter: Secondary | ICD-10-CM

## 2017-08-17 DIAGNOSIS — S70362A Insect bite (nonvenomous), left thigh, initial encounter: Secondary | ICD-10-CM

## 2017-08-17 NOTE — Progress Notes (Signed)
Patient ID: Julie Clay MRN: [id number], DOB: [birth year], 77 y.o. Date of Encounter: 08/17/2017, 4:16 PM    Chief Complaint:  Chief Complaint  Patient presents with  . Tick Removal    last thursday      HPI: 77 y.o. year old female presents with above.   She reports that she first saw this tic on her left upper thigh on Thursday, 08/11/2017.  Says that at that time she thought it was just a skin tag or something.  Says that it was not until Friday that she looked closer and realized that she thought it was a tick and had her husband look at it and help her remove it. States that she came in today because she was not sure if the site was getting infected.  She reports that she has noticed no other areas of any rash. Reports that she has had no fevers. Reports that she has had no myalgias no increased "aches and pains "compared to usual Reports that she has had no malaise not feeling more tired more fatigued than usual.     Home Meds:   Outpatient Medications Prior to Visit  Medication Sig Dispense Refill  . acetaminophen (TYLENOL) 500 MG tablet Take 500 mg by mouth as needed for moderate pain. Takes 2 when needed    . albuterol (PROVENTIL HFA;VENTOLIN HFA) 108 (90 Base) MCG/ACT inhaler Inhale 2 puffs into the lungs every 4 (four) hours as needed. 6.7 g 6  . Aloe Vera Leaf POWD Take 800 mg by mouth 2 (two) times daily.     . Ascorbic Acid (VITAMIN C) 1000 MG tablet Take 1,000 mg by mouth 2 (two) times daily.    Marland Kitchen b complex vitamins tablet Take 1 tablet by mouth 2 (two) times daily.     . Cholecalciferol (VITAMIN D3) 2000 UNITS TABS Take 2,000 Units by mouth 2 (two) times daily.    Mariane Baumgarten Calcium (STOOL SOFTENER PO) Take 1 capsule by mouth as needed (constipation).     . Echinacea 380 MG CAPS Take 1 capsule by mouth as needed (cold/flue).    . fluocinonide cream (LIDEX) 3.82 % Apply 1 application topically 2 (two) times daily as needed (poison ivy/bites/hives). 30 g 11  .  Ginkgo Biloba 120 MG CAPS Take 1 capsule by mouth daily.     Marland Kitchen glipiZIDE (GLUCOTROL) 10 MG tablet TAKE 1 TABLET BY MOUTH TWICE DAILY BEFORE A MEAL 180 tablet 0  . KRILL OIL PO Take 1 capsule by mouth 2 (two) times daily.    . Melatonin 3 MG TABS Take 3 mg by mouth at bedtime as needed and may repeat dose one time if needed (sleep).     . metFORMIN (GLUCOPHAGE) 1000 MG tablet TAKE 1 TABLET BY MOUTH TWICE DAILY WITH A MEAL 180 tablet 0  . mometasone-formoterol (DULERA) 100-5 MCG/ACT AERO Inhale 2 puffs into the lungs 2 (two) times daily. 1 Inhaler 6  . montelukast (SINGULAIR) 10 MG tablet TAKE 1 TABLET BY MOUTH EVERY NIGHT AT BEDTIME 90 tablet 0  . Multiple Vitamin (MULTIVITAMIN WITH MINERALS) TABS tablet Take 1 tablet by mouth daily.    . Naftifine HCl 1 % GEL Apply 1 application topically daily as needed.    . NONFORMULARY OR COMPOUNDED ITEM Apply 1 application topically 2 (two) times daily as needed. Reported on 06/10/2015    . rosuvastatin (CRESTOR) 5 MG tablet TAKE 1 TABLET(5 MG) BY MOUTH DAILY 90 tablet 0  . simethicone (MYLICON)  80 MG chewable tablet Chew 80 mg by mouth as needed for flatulence.     Marland Kitchen tiZANidine (ZANAFLEX) 4 MG tablet TAKE 1 TABLET BY MOUTH EVERY NIGHT AT BEDTIME AS NEEDED FOR MUSCLE SPASMS 90 tablet 0  . triamcinolone cream (KENALOG) 0.1 % Apply 1 application topically 2 (two) times daily. 30 g 0  . triamterene-hydrochlorothiazide (MAXZIDE) 75-50 MG tablet TAKE 1 TABLET BY MOUTH EVERY DAY 90 tablet 0  . TRULICITY 0.32 ZY/2.4MG SOPN INJECT 1 PEN UNDER THE SKIN EVERY WEEK 2 mL 2  . triamterene-hydrochlorothiazide (MAXZIDE) 75-50 MG tablet Take 0.5 tablets by mouth daily.     No facility-administered medications prior to visit.     Allergies:  Allergies  Allergen Reactions  . Arimidex [Anastrozole] Other (See Comments)    Arthralgias and myalgias.  Improved with 3 week hiatus from drug.  Categorical side effect of drug class.  . Adhesive [Tape] Other (See Comments)     "red and blistered"  . Lisinopril     Dry hacking cough and tickle  . Tetracyclines & Related Other (See Comments)    unknown      Review of Systems: See HPI for pertinent ROS. All other ROS negative.    Physical Exam: Blood pressure 132/60, pulse (!) 51, temperature 98.3 F (36.8 C), temperature source Oral, resp. rate 14, height 5\' 5"  (1.651 m), weight 102.7 kg (226 lb 6.4 oz), SpO2 98 %., Body mass index is 37.67 kg/m. General:  Obese WF. Appears in no acute distress. Neck: Supple. No thyromegaly. No lymphadenopathy. Lungs: Clear bilaterally to auscultation without wheezes, rales, or rhonchi. Breathing is unlabored. Heart: Regular rhythm. No murmurs, rubs, or gallops. Msk:  Strength and tone normal for age. Extremities/Skin: She is extremely obese with large panniculus of abdomen hanging down over groin/upper thigh. She pulls this up, back to expose left upper thigh. On left upper thigh--there is ~ 1cm diameter of purple ecchymosis. Remainder of skin normal.  Neuro: Alert and oriented X 3. Moves all extremities spontaneously. Gait is normal. CNII-XII grossly in tact. Psych:  Responds to questions appropriately with a normal affect.     ASSESSMENT AND PLAN:  77 y.o. year old female with  1. Tick bite, initial encounter I reassured her that what she is seeing and what I am seeing on exam is ecchymosis--blood under the skin---  Secondary to trauma of them removing the tick and possibly secondary to her scratching the site since then. I reassured her that I see no signs of infection. Also reassured her that I see no signs or symptoms of Lyme disease, [place] spotted fever. She is to follow-up if develops any signs of infection or signs or symptoms of Lyme or [facility] spotted fever.   [address] [place], [place], [facility] 08/17/2017 4:16 PM

## 2017-08-19 ENCOUNTER — Inpatient Hospital Stay (HOSPITAL_COMMUNITY): Payer: Medicare Other | Attending: Hematology | Admitting: Hematology

## 2017-08-19 ENCOUNTER — Other Ambulatory Visit: Payer: Self-pay

## 2017-08-19 ENCOUNTER — Encounter (HOSPITAL_COMMUNITY): Payer: Self-pay | Admitting: Hematology

## 2017-08-19 VITALS — BP 136/84 | HR 89 | Temp 98.2°F | Resp 18 | Ht 65.0 in | Wt 224.8 lb

## 2017-08-19 DIAGNOSIS — C50912 Malignant neoplasm of unspecified site of left female breast: Secondary | ICD-10-CM

## 2017-08-19 DIAGNOSIS — I890 Lymphedema, not elsewhere classified: Secondary | ICD-10-CM | POA: Insufficient documentation

## 2017-08-19 DIAGNOSIS — Z853 Personal history of malignant neoplasm of breast: Secondary | ICD-10-CM | POA: Diagnosis not present

## 2017-08-19 DIAGNOSIS — E119 Type 2 diabetes mellitus without complications: Secondary | ICD-10-CM | POA: Insufficient documentation

## 2017-08-19 DIAGNOSIS — Z7984 Long term (current) use of oral hypoglycemic drugs: Secondary | ICD-10-CM

## 2017-08-19 DIAGNOSIS — Z79899 Other long term (current) drug therapy: Secondary | ICD-10-CM | POA: Diagnosis not present

## 2017-08-19 NOTE — Progress Notes (Signed)
Patient Care Team: [facility], Modena Nunnery, MD as PCP - General (Family Medicine)  DIAGNOSIS:  Encounter Diagnosis  Name Primary?  . 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  D/C'd due to arthralgias and myalgias      04/19/2012 - 10/2016 Chemotherapy    Tamoxifen        CHIEF COMPLIANT: Stage I invasive ductal carcinoma of (L) breast; ER+/PR+/HER2- and possible (R) breast mass (noted on mammogram in 01/2017)   INTERVAL HISTORY: Julie Clay is a 77 y.o. female here for follow-up for her h/o left breast cancer.    She was found to have possible (R) breast mass on mammogram on 02/08/17.  She refused (R) breast biopsy in 01/2017.  Therefore, it was recommended for her to repeat bilateral diagnostic mammogram and (R) breast ultrasound in 07/2017.   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  She did not want to have her repeat mammogram done of the right breast d/t pacemaker placement.  She is reluctant to have a mammogram until her cardiologist sees her in early June. Her mammogram has been rescheduled for 10/04/17.   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    She did see a homeopathic doctor with her left breast cancer and took some supplements. She was happy that she did not require chemotherapy or radiation for that cancer.    She is taking vitamin D BID. She tries to get calcium from her diet rather than a supplement.     REVIEW OF SYSTEMS:   Constitutional: Denies fevers, chills or abnormal weight loss Eyes: Denies blurriness of vision Ears, nose, mouth, throat, and face: Denies mucositis or sore throat Respiratory: Denies cough, dyspnea or wheezes Cardiovascular: Denies palpitation, chest discomfort Gastrointestinal:  Denies nausea, heartburn or change  in bowel habits Skin: Denies abnormal skin rashes Lymphatics: Denies new lymphadenopathy or easy bruising Neurological:Denies numbness, tingling or new weaknesses Behavioral/Psych: Mood is stable, no new changes  Extremities: No lower extremity edema Breast: denies any pain or lumps or nodules in either breasts All other systems were reviewed with the patient and are negative.  I have reviewed the past medical history, past surgical history, social history and family history with the patient and they are unchanged from previous note.  ALLERGIES:  is allergic to arimidex [anastrozole]; adhesive [tape]; lisinopril; and tetracyclines & related.  MEDICATIONS:  Current Outpatient Medications  Medication Sig Dispense Refill  . acetaminophen (TYLENOL) 500 MG tablet Take 500 mg by mouth as needed for moderate pain. Takes 2 when needed    . albuterol (PROVENTIL HFA;VENTOLIN HFA) 108 (90 Base) MCG/ACT inhaler Inhale 2 puffs into the lungs every 4 (four) hours as needed. 6.7 g 6  . Aloe Vera Leaf POWD Take 800 mg by mouth 2 (two) times daily.     . Ascorbic Acid (VITAMIN C) 1000 MG tablet Take 1,000 mg by mouth 2 (two) times daily.    Marland Kitchen b complex vitamins tablet Take 1 tablet by mouth 2 (two) times daily.     . Cholecalciferol (VITAMIN D3) 2000 UNITS TABS Take 2,000 Units by mouth 2 (two) times daily.    Mariane Baumgarten Calcium (STOOL SOFTENER PO) Take 1 capsule by mouth as needed (constipation).     . Echinacea 380 MG CAPS  Take 1 capsule by mouth as needed (cold/flue).    . fluocinonide cream (LIDEX) 2.99 % Apply 1 application topically 2 (two) times daily as needed (poison ivy/bites/hives). 30 g 11  . Ginkgo Biloba 120 MG CAPS Take 1 capsule by mouth daily.     Marland Kitchen glipiZIDE (GLUCOTROL) 10 MG tablet TAKE 1 TABLET BY MOUTH TWICE DAILY BEFORE A MEAL 180 tablet 0  . KRILL OIL PO Take 1 capsule by mouth 2 (two) times daily.    . Melatonin 3 MG TABS Take 3 mg by mouth at bedtime as needed and may repeat dose  one time if needed (sleep).     . metFORMIN (GLUCOPHAGE) 1000 MG tablet TAKE 1 TABLET BY MOUTH TWICE DAILY WITH A MEAL 180 tablet 0  . mometasone-formoterol (DULERA) 100-5 MCG/ACT AERO Inhale 2 puffs into the lungs 2 (two) times daily. 1 Inhaler 6  . Multiple Vitamin (MULTIVITAMIN WITH MINERALS) TABS tablet Take 1 tablet by mouth daily.    . NONFORMULARY OR COMPOUNDED ITEM Apply 1 application topically 2 (two) times daily as needed. Reported on 06/10/2015    . rosuvastatin (CRESTOR) 5 MG tablet TAKE 1 TABLET(5 MG) BY MOUTH DAILY 90 tablet 0  . simethicone (MYLICON) 80 MG chewable tablet Chew 80 mg by mouth as needed for flatulence.     Marland Kitchen tiZANidine (ZANAFLEX) 4 MG tablet TAKE 1 TABLET BY MOUTH EVERY NIGHT AT BEDTIME AS NEEDED FOR MUSCLE SPASMS 90 tablet 0  . triamcinolone cream (KENALOG) 0.1 % Apply 1 application topically 2 (two) times daily. 30 g 0  . triamterene-hydrochlorothiazide (MAXZIDE) 75-50 MG tablet TAKE 1 TABLET BY MOUTH EVERY DAY 90 tablet 0  . TRULICITY 2.42 AS/3.4HD SOPN INJECT 1 PEN UNDER THE SKIN EVERY WEEK 2 mL 2   No current facility-administered medications for this visit.     PHYSICAL EXAMINATION: ECOG PERFORMANCE STATUS: 1 - Symptomatic but completely ambulatory  Vitals:   08/19/17 1510  BP: 136/84  Pulse: 89  Resp: 18  Temp: 98.2 F (36.8 C)  SpO2: 98%   Filed Weights   08/19/17 1510  Weight: 224 lb 12.8 oz (102 kg)    GENERAL:alert, no distress and comfortable SKIN: skin color, texture, turgor are normal, no rashes or significant lesions EYES: normal, Conjunctiva are pink and non-injected, sclera clear OROPHARYNX:no mucositis, no erythema and lips, buccal mucosa, and tongue normal  NECK: supple, thyroid normal size, non-tender, without nodularity LYMPH:  no palpable lymphadenopathy in the cervical, axillary or inguinal LUNGS: clear to auscultation and percussion with normal breathing effort HEART: regular rate & rhythm and no murmurs and no lower  extremity edema ABDOMEN:abdomen soft, non-tender and normal bowel sounds MUSCULOSKELETAL:no cyanosis of digits and no clubbing   EXTREMITIES: No lower extremity edema BREAST: No palpable masses in bilateral breasts.  Left breast lumpectomy site is stable.  No palpable axillary adenopathy.  Pacemaker on the right upper chest present.  Incision is well-healed.  LABORATORY DATA:  I have reviewed the data as listed CMP Latest Ref Rng & Units 07/08/2017 06/07/2017 05/31/2017  Glucose 65 - 99 mg/dL 110(H) 167(H) 98  BUN 7 - 25 mg/dL 26(H) 26(H) 35(H)  Creatinine 0.60 - 0.93 mg/dL 0.96(H) 1.03(H) 1.12(H)  Sodium 135 - 146 mmol/L 139 142 142  Potassium 3.5 - 5.3 mmol/L 4.4 3.6 4.1  Chloride 98 - 110 mmol/L 102 103 103  CO2 20 - 32 mmol/L _0 Calcium 8.6 - 10.4 mg/dL 9.9 9.6 10.4  Total Protein 6.1 -  8.1 g/dL 6.7 - 7.1  Total Bilirubin 0.2 - 1.2 mg/dL 0.4 - 0.4  Alkaline Phos 33 - 130 U/L - - -  AST 10 - 35 U/L 22 - 24  ALT 6 - 29 U/L 19 - 22   No results found for: BUY370   Lab Results  Component Value Date   WBC 15.4 (H) 07/08/2017   HGB 12.2 07/08/2017   HCT 36.9 07/08/2017   MCV 80.7 07/08/2017   PLT 284 07/08/2017   NEUTROABS 11,781 (H) 07/08/2017    ASSESSMENT & PLAN:  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  Stage I left breast IDC, ER/PR positive, HER-2 negative: Oncotype DX score of 0, declined radiation therapy, left breast ADH and ALH, status post lumpectomy on 09/12/2013 - Anastrozole from March 2013 through December 2013, discontinued secondary to arthralgias and myalgias, took tamoxifen for 4 years from January 2014 through January 2018. - Mammogram of the right breast on 02/08/2017 shows new finding, did not have biopsy done as recommended, missed mammogram appointment in April of this year secondary to pacemaker placement on the right side. -Today physical examination did not reveal any masses.  She has an appointment to do bilateral mammograms on  10/04/2017 after seeing her cardiologist in the first week of June.  She will continue vitamin D supplements.   Breast Cancer therapy associated bone loss: I have recommended calcium, Vitamin D and weight bearing exercises.   The patient has a good understanding of the overall plan. she agrees with it. she will call with any problems that may develop before the next visit here.   This note includes documentation from Mike Craze, NP, who was present during this patient's office visit and evaluation.  I have reviewed this note for its completeness and accuracy.  I have edited this note accordingly based on my findings and medical opinion.      Derek Jack, MD 08/19/17

## 2017-08-19 NOTE — Patient Instructions (Signed)
[facility] Discharge Instructions  Today you saw Dr. K.   Thank you for choosing [facility] at [facility] to provide your oncology and hematology care.  To afford each patient quality time with our provider, please arrive at least 15 minutes before your scheduled appointment time.   If you have a lab appointment with the Cancer Center please come in thru the  Main Entrance and check in at the main information desk  You need to re-schedule your appointment should you arrive 10 or more minutes late.  We strive to give you quality time with our providers, and arriving late affects you and other patients whose appointments are after yours.  Also, if you no show three or more times for appointments you may be dismissed from the clinic at the providers discretion.     Again, thank you for choosing [facility].  Our hope is that these requests will decrease the amount of time that you wait before being seen by our physicians.       _____________________________________________________________  Should you have questions after your visit to [facility], please contact our office at [phone] between the hours of 8:30 a.m. and 4:30 p.m.  Voicemails left after 4:30 p.m. will not be returned until the following business day.  For prescription refill requests, have your pharmacy contact our office.       Resources For Cancer Patients and their Caregivers ? American Cancer Society: Can assist with transportation, wigs, general needs, runs Look Good Feel Better.        1-888-227-6333 ? Cancer Care: Provides financial assistance, online support groups, medication/co-pay assistance.  1-800-813-HOPE (4673) ? [facility] Assists [place] cancer patients and their families through emotional , educational and financial support.  336-427-4357 ? [place] DSS Where to apply for food  stamps, Medicaid and utility assistance. 336-342-1394 ? [facility]: Transportation to medical appointments. 336-347-2287 ? Social Security Administration: May apply for disability if have a Stage IV cancer. 336-342-7796 1-800-772-1213 ? [place] Aging, Disability and Transit Services: Assists with nutrition, care and transit needs. 336-349-2343  Cancer Center Support Programs:   > Cancer Support Group  2nd Tuesday of the month 1pm-2pm, Journey Room   > Creative Journey  3rd Tuesday of the month 1130am-1pm, Journey Room    

## 2017-08-19 NOTE — Assessment & Plan Note (Signed)
1.  Stage I left breast IDC, ER/PR positive, HER-2 negative: Oncotype DX score of 0, declined radiation therapy, left breast ADH and ALH, status post lumpectomy on 09/12/2013 - Anastrozole from March 2013 through December 2013, discontinued secondary to arthralgias and myalgias, took tamoxifen for 4 years from January 2014 through January 2018. - Mammogram of the right breast on 02/08/2017 shows new finding, did not have biopsy done as recommended, missed mammogram appointment in April of this year secondary to pacemaker placement on the right side. -Today physical examination did not reveal any masses.  She has an appointment to do bilateral mammograms on 10/04/2017 after seeing her cardiologist in the first week of June.  She will continue vitamin D supplements.

## 2017-09-23 ENCOUNTER — Other Ambulatory Visit: Payer: Self-pay | Admitting: Internal Medicine

## 2017-09-23 ENCOUNTER — Encounter: Payer: Self-pay | Admitting: Internal Medicine

## 2017-09-23 ENCOUNTER — Ambulatory Visit (INDEPENDENT_AMBULATORY_CARE_PROVIDER_SITE_OTHER): Payer: Medicare Other | Admitting: Internal Medicine

## 2017-09-23 VITALS — BP 126/78 | HR 89 | Ht 65.0 in | Wt 225.2 lb

## 2017-09-23 DIAGNOSIS — I441 Atrioventricular block, second degree: Secondary | ICD-10-CM | POA: Diagnosis not present

## 2017-09-23 DIAGNOSIS — I4430 Unspecified atrioventricular block: Secondary | ICD-10-CM | POA: Diagnosis not present

## 2017-09-23 DIAGNOSIS — Z950 Presence of cardiac pacemaker: Secondary | ICD-10-CM | POA: Diagnosis not present

## 2017-09-23 LAB — CUP PACEART INCLINIC DEVICE CHECK
Battery Remaining Longevity: 121 mo
Battery Voltage: 3.02 V
Implantable Lead Implant Date: 20190306
Implantable Lead Location: 753859
Implantable Lead Model: 5076
Implantable Pulse Generator Implant Date: 20190306
Lead Channel Impedance Value: 400 Ohm
Lead Channel Pacing Threshold Amplitude: 0.5 V
Lead Channel Pacing Threshold Amplitude: 0.5 V
Lead Channel Pacing Threshold Amplitude: 0.5 V
Lead Channel Pacing Threshold Amplitude: 0.5 V
Lead Channel Pacing Threshold Pulse Width: 0.5 ms
Lead Channel Sensing Intrinsic Amplitude: 10 mV
Lead Channel Sensing Intrinsic Amplitude: 4.5 mV
Lead Channel Setting Pacing Amplitude: 2 V
MDC IDC LEAD IMPLANT DT: 20190306
MDC IDC LEAD LOCATION: 753860
MDC IDC MSMT LEADCHNL RA PACING THRESHOLD PULSEWIDTH: 0.5 ms
MDC IDC MSMT LEADCHNL RA PACING THRESHOLD PULSEWIDTH: 0.5 ms
MDC IDC MSMT LEADCHNL RV IMPEDANCE VALUE: 525 Ohm
MDC IDC MSMT LEADCHNL RV PACING THRESHOLD PULSEWIDTH: 0.5 ms
MDC IDC PG SERIAL: 9001027
MDC IDC SESS DTM: 20190607134857
MDC IDC SET LEADCHNL RV PACING AMPLITUDE: 2.5 V
MDC IDC SET LEADCHNL RV PACING PULSEWIDTH: 0.5 ms
MDC IDC SET LEADCHNL RV SENSING SENSITIVITY: 2 mV
MDC IDC STAT BRADY RA PERCENT PACED: 7.3 %
MDC IDC STAT BRADY RV PERCENT PACED: 40 %

## 2017-09-23 MED ORDER — METOPROLOL SUCCINATE ER 25 MG PO TB24: 25.0000 mg | ORAL_TABLET | Freq: Every day | ORAL | 0 refills | Status: DC

## 2017-09-23 MED ORDER — PROPRANOLOL HCL ER 60 MG PO CP24: 60.0000 mg | ORAL_CAPSULE | Freq: Every day | ORAL | 0 refills | Status: DC

## 2017-09-23 MED ORDER — ATENOLOL 25 MG PO TABS: 25.0000 mg | ORAL_TABLET | Freq: Every day | ORAL | 0 refills | Status: DC

## 2017-09-23 MED ORDER — TORSEMIDE 20 MG PO TABS: 20.0000 mg | ORAL_TABLET | Freq: Every day | ORAL | 3 refills | Status: DC

## 2017-09-23 NOTE — Progress Notes (Signed)
Patient Care Team: [place], [place] Nunnery, MD as PCP - General (Family Medicine)   HPI  Julie Clay is a 77 y.o. female Seen in follow-up for a pacemaker implanted 3/19 for second-degree heart block and symptomatic bradycardia.  2/19 echocardiogram LVEF normal   Date Cr  K TSH  2/19 1.03 3.6 3.42         She has had more problems with peripheral edema shortness of breath.  This is notable at about 20-30 feet.  Denies nocturnal dyspnea but has chronic two-pillow orthopnea.  She is not any better really following pacemaker implantation.     Past Medical History:  Diagnosis Date  . Asthma    spirometry (2011)- mild ventilary defect  . Bone spur    "heels, knees, left shoulder" (06/22/2017)  . Breast cancer, left breast ([place]) dx'd 03/2011  . Calcification of left breast   . Chronic bronchitis ([place])   . COPD (chronic obstructive pulmonary disease) ([place])   . Dysrhythmia    palpatations  . Fatty liver 2013   enlarged  . GERD (gastroesophageal reflux disease)   . Heart murmur    "very slight one" (06/22/2017)  . HSV (herpes simplex virus) infection   . Hyperlipidemia   . Hypertension   . 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 Lung nodule seen on imaging study 2013  . Lymphedema    Left arm  . Osteoarthritis    "all over" (06/22/2017)  . Pneumonia    "maybe once or twice" (06/22/2017)  . Presence of permanent cardiac pacemaker   . Shortness of breath    with exertion  . Sleep apnea    Stop Bang score of 5. Pt has had sleep study, but was shown to be negative for sleep apnea.  . Sliding hiatal hernia   . Type II diabetes mellitus ([place])   . Ventral hernia, unspecified, without mention of obstruction or gangrene     Past Surgical History:  Procedure Laterality Date  . BREAST BIOPSY Left 04/2011  . BREAST BIOPSY Left 06/22/13  . BREAST BIOPSY Left 09/12/2013   Procedure: BREAST BIOPSY WITH NEEDLE LOCALIZATION;  Surgeon: Jamesetta So, MD;  Location: [facility];  Service: General;  Laterality: Left;  . BREAST LUMPECTOMY Left 04/2011 X 2  . CATARACT EXTRACTION W/ INTRAOCULAR LENS  IMPLANT, BILATERAL Bilateral   . CATARACT EXTRACTION, BILATERAL     on different occassions   . [place]  . DILATION AND CURETTAGE OF UTERUS  11/06/2013  . HAND LIGAMENT RECONSTRUCTION Right ~ 2010  . HYSTEROSCOPY W/D&C N/A 11/06/2013   Procedure: DILATATION AND CURETTAGE /HYSTEROSCOPY;  Surgeon: Jonnie Kind, MD;  Location: [facility];  Service: Gynecology;  Laterality: N/A;  . INSERT / REPLACE / REMOVE PACEMAKER  06/22/2017  . LUNG LOBECTOMY Right 1988   Fungal Infection  . PACEMAKER IMPLANT N/A 06/22/2017   Procedure: PACEMAKER IMPLANT;  Surgeon: Deboraha Sprang, MD;  Location: [place] [facility];  Service: Cardiovascular;  Laterality: N/A;  . POLYPECTOMY N/A 11/06/2013   Procedure: POLYPECTOMY (REMOVAL ENDOMETRIAL POLYP);  Surgeon: Jonnie Kind, MD;  Location: [facility];  Service: Gynecology;  Laterality: N/A;  . RECTOCELE REPAIR N/A 11/06/2013   Procedure: POSTERIOR REPAIR (RECTOCELE);  Surgeon: Jonnie Kind, MD;  Location: [facility];  Service: Gynecology;  Laterality: N/A;  . SHOULDER ARTHROSCOPY WITH OPEN ROTATOR CUFF REPAIR Left 07/26/2012  . TONSILLECTOMY  1949  . TUBAL  LIGATION      Current Meds  Medication Sig  . acetaminophen (TYLENOL) 500 MG tablet Take 500 mg by mouth as needed for moderate pain. Takes 2 when needed  . albuterol (PROVENTIL HFA;VENTOLIN HFA) 108 (90 Base) MCG/ACT inhaler Inhale 2 puffs into the lungs every 4 (four) hours as needed.  Vanita Ingles Leaf POWD Take 800 mg by mouth 2 (two) times daily.   . Ascorbic Acid (VITAMIN C) 1000 MG tablet Take 1,000 mg by mouth 2 (two) times daily.  Marland Kitchen b complex vitamins tablet Take 1 tablet by mouth 2 (two) times daily.   . Cholecalciferol (VITAMIN D3) 2000 UNITS TABS Take 2,000 Units by mouth 2 (two) times daily.  Mariane Baumgarten Calcium (STOOL SOFTENER PO) Take 1  capsule by mouth as needed (constipation).   . Echinacea 380 MG CAPS Take 1 capsule by mouth as needed (cold/flue).  . fluocinonide cream (LIDEX) 1.61 % Apply 1 application topically 2 (two) times daily as needed (poison ivy/bites/hives).  . Ginkgo Biloba 120 MG CAPS Take 1 capsule by mouth daily.   Marland Kitchen glipiZIDE (GLUCOTROL) 10 MG tablet TAKE 1 TABLET BY MOUTH TWICE DAILY BEFORE A MEAL  . KRILL OIL PO Take 1 capsule by mouth 2 (two) times daily.  . Melatonin 3 MG TABS Take 3 mg by mouth at bedtime as needed and may repeat dose one time if needed (sleep).   . metFORMIN (GLUCOPHAGE) 1000 MG tablet TAKE 1 TABLET BY MOUTH TWICE DAILY WITH A MEAL  . mometasone-formoterol (DULERA) 100-5 MCG/ACT AERO Inhale 2 puffs into the lungs 2 (two) times daily.  . Multiple Vitamin (MULTIVITAMIN WITH MINERALS) TABS tablet Take 1 tablet by mouth daily.  . NONFORMULARY OR COMPOUNDED ITEM Apply 1 application topically 2 (two) times daily as needed. Reported on 06/10/2015  . rosuvastatin (CRESTOR) 5 MG tablet TAKE 1 TABLET(5 MG) BY MOUTH DAILY  . simethicone (MYLICON) 80 MG chewable tablet Chew 80 mg by mouth as needed for flatulence.   Marland Kitchen tiZANidine (ZANAFLEX) 4 MG tablet TAKE 1 TABLET BY MOUTH EVERY NIGHT AT BEDTIME AS NEEDED FOR MUSCLE SPASMS  . triamcinolone cream (KENALOG) 0.1 % Apply 1 application topically 2 (two) times daily.  Marland Kitchen triamterene-hydrochlorothiazide (MAXZIDE) 75-50 MG tablet TAKE 1 TABLET BY MOUTH EVERY DAY  . TRULICITY 0.96 EA/5.4UJ SOPN INJECT 1 PEN UNDER THE SKIN EVERY WEEK    Allergies  Allergen Reactions  . Arimidex [Anastrozole] Other (See Comments)    Arthralgias and myalgias.  Improved with 3 week hiatus from drug.  Categorical side effect of drug class.  . Adhesive [Tape] Other (See Comments)    "red and blistered"  . Lisinopril     Dry hacking cough and tickle  . Tetracyclines & Related Other (See Comments)    unknown      Review of Systems negative except from HPI and  PMH  Physical Exam BP 126/78   Pulse 89   Ht 5\' 5"  (1.651 m)   Wt 225 lb 3.2 oz (102.2 kg)   SpO2 97%   BMI 37.48 kg/m  Well developed and nourished in no acute distress HENT normal Neck supple with JVP-10 Clear Device pocket well healed; without hematoma or erythema.  There is no tethering  Regular rate and rhythm, no murmurs or gallops Abd-soft with active BS No Clubbing cyanosis 2+ edema Skin-warm and dry A & Oriented  Grossly normal sensory and motor function  ECG demonstrates sinus rhythm with short PR and intrinsic conduction and then  intermittent poor conduction with ventricular pacing the second paced beat comes of a coupling interval of about 630 ms without a discernible preceding P wave.  There is VA conduction with a long conduction time.   Assessment and  Plan  [place]itz 2 second-degree heart block Intermittent  Pacemaker St Jude   Lightheadedness  Irregularity and intermittent ventricular pacing  Congestive heart failure-chronic-diastolic    The patient is volume overloaded.  We discussed extensively the physiology of diastolic heart failure with the emphasis on fluid and restricting heart rates.  We will switch her Maxide to torsemide, she did not tolerate furosemide in the past.  We will use 40 mg daily for 3 days and then reduce it to 20 mg a day.  We will also add beta-blockers to try to control her right shifted sinus rate.  7% of her beats are faster than 100 bpm and 25% of her faster than 90.  We have given her prescriptions for atenolol, metoprolol succinate, and Inderal LA 60.  She will look to see which she tolerates best.  The irregularity of her pacemaker function suggests that there is retrograde conduction being tracked for the second couple beat and intermittent poor conduction.  We have reprogrammed the device taken off PPI at length and the AV delay to 250 ms to allow for P synchronous pacing is necessary but hopefully will decrease the likelihood  of couple paced beats.  Will check surveillance labs today  More than 50% of 40 min was spent in counseling related to the above        Current medicines are reviewed at length with the patient today .  The patient does not  have concerns regarding medicines.

## 2017-09-23 NOTE — Patient Instructions (Signed)
Medication Instructions:  Your physician has recommended you make the following change in your medication:   1. Discontinue triamterene-hydrochlorothiazide (MAXZIDE) 75-50 MG tablet 2. Begin Demadex, 40 mg, two tablets a day, for the next 3 days (June 7-9) Beginning June 10, begin 20 mg, one tablet per day  Beta Blocker Trial You are being given three prescriptions for different beta blockers to try.  You may take these in any order. DO NOT TAKE MORE THAN ONE BETA BLOCKER AT A TIME. Choose one beta blocker to start with. If after two weeks, you feel the medication is working well for you, continue that current medication. If it does not work well for you, try another prescription for a beta blocker at that time. You may continue that current beta blocker at that time if it works well for you. If it does not, repeat these instructions for the third beta blocker.  When you find a beta blocker that works well for you, call the office and we will send in a prescription for you.               1. Atenolol 25mg  tablet             2. Metoprolol Succ 25mg  tablet             3.Inderall LA 60mg  tablet  You may take these in any order you please.   Please call us with any questions.   Labwork: Your physician recommends that you return for lab work in: 2 weeks for a BMP   Testing/Procedures: None ordered.  Follow-Up: Your physician wants you to follow-up in: 3 months with Chanetta Marshall, NP.   Remote monitoring is used to monitor your Pacemaker from home. This monitoring reduces the number of office visits required to check your device to one time per year. It allows [place] to keep an eye on the functioning of your device to ensure it is working properly. You are scheduled for a device check from home on 12/23/2017. You may send your transmission at any time that day. If you have a wireless device, the transmission will be sent automatically. After your physician reviews your transmission, you  will receive a postcard with your next transmission date.     Any Other Special Instructions Will Be Listed Below (If Applicable).     If you need a refill on your cardiac medications before your next appointment, please call your pharmacy.

## 2017-09-28 ENCOUNTER — Ambulatory Visit (HOSPITAL_COMMUNITY): Payer: Medicare Other | Admitting: Hematology

## 2017-10-03 ENCOUNTER — Other Ambulatory Visit: Payer: Self-pay | Admitting: Family Medicine

## 2017-10-03 DIAGNOSIS — R928 Other abnormal and inconclusive findings on diagnostic imaging of breast: Secondary | ICD-10-CM

## 2017-10-04 ENCOUNTER — Ambulatory Visit (HOSPITAL_COMMUNITY)
Admission: RE | Admit: 2017-10-04 | Discharge: 2017-10-04 | Disposition: A | Discharge status: Home / Self Care | Payer: Medicare Other | Source: Ambulatory Visit | Attending: Adult Health | Admitting: Adult Health

## 2017-10-04 DIAGNOSIS — R928 Other abnormal and inconclusive findings on diagnostic imaging of breast: Secondary | ICD-10-CM | POA: Insufficient documentation

## 2017-10-04 DIAGNOSIS — C50912 Malignant neoplasm of unspecified site of left female breast: Secondary | ICD-10-CM | POA: Diagnosis not present

## 2017-10-04 DIAGNOSIS — N6310 Unspecified lump in the right breast, unspecified quadrant: Secondary | ICD-10-CM

## 2017-10-04 DIAGNOSIS — N6002 Solitary cyst of left breast: Secondary | ICD-10-CM | POA: Diagnosis not present

## 2017-10-10 ENCOUNTER — Other Ambulatory Visit: Payer: Self-pay | Admitting: Internal Medicine

## 2017-10-10 DIAGNOSIS — I441 Atrioventricular block, second degree: Secondary | ICD-10-CM | POA: Diagnosis not present

## 2017-10-10 DIAGNOSIS — Z950 Presence of cardiac pacemaker: Secondary | ICD-10-CM | POA: Diagnosis not present

## 2017-10-10 DIAGNOSIS — I4430 Unspecified atrioventricular block: Secondary | ICD-10-CM | POA: Diagnosis not present

## 2017-10-10 LAB — BASIC METABOLIC PANEL WITH GFR
BUN/Creatinine Ratio: 21 (calc) (ref 6–22)
BUN: 20 mg/dL (ref 7–25)
CALCIUM: 9.5 mg/dL (ref 8.6–10.4)
CO2: 29 mmol/L (ref 20–32)
CREATININE: 0.94 mg/dL — AB (ref 0.60–0.93)
Chloride: 105 mmol/L (ref 98–110)
GFR, EST AFRICAN AMERICAN: 69 mL/min/{1.73_m2} (ref >=60)
GFR, EST NON AFRICAN AMERICAN: 59 mL/min/{1.73_m2} — AB (ref >=60)
Glucose, Bld: 231 mg/dL — ABNORMAL HIGH (ref 65–139)
Potassium: 3.8 mmol/L (ref 3.5–5.3)
Sodium: 144 mmol/L (ref 135–146)

## 2017-10-14 ENCOUNTER — Encounter (HOSPITAL_COMMUNITY): Payer: Self-pay | Admitting: Hematology

## 2017-10-14 ENCOUNTER — Inpatient Hospital Stay (HOSPITAL_COMMUNITY): Payer: Medicare Other | Attending: Hematology | Admitting: Hematology

## 2017-10-14 ENCOUNTER — Other Ambulatory Visit: Payer: Self-pay

## 2017-10-14 VITALS — BP 154/70 | HR 65 | Temp 98.7°F | Resp 20 | Wt 226.0 lb

## 2017-10-14 DIAGNOSIS — Z800 Family history of malignant neoplasm of digestive organs: Secondary | ICD-10-CM | POA: Diagnosis not present

## 2017-10-14 DIAGNOSIS — R928 Other abnormal and inconclusive findings on diagnostic imaging of breast: Secondary | ICD-10-CM | POA: Diagnosis not present

## 2017-10-14 DIAGNOSIS — Z853 Personal history of malignant neoplasm of breast: Secondary | ICD-10-CM | POA: Diagnosis not present

## 2017-10-14 DIAGNOSIS — C50912 Malignant neoplasm of unspecified site of left female breast: Secondary | ICD-10-CM

## 2017-10-14 DIAGNOSIS — N6310 Unspecified lump in the right breast, unspecified quadrant: Secondary | ICD-10-CM

## 2017-10-14 NOTE — Progress Notes (Signed)
[place], [place] [address]   CLINIC:  Medical Oncology/Hematology  PCP:  Alycia Rossetti, MD [address] [place]  [id number] [phone]   REASON FOR VISIT:  Follow-up for left breast cancer, and right breast abnormal findings.  CURRENT THERAPY: None.  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 Lightsey 77 y.o. female returns for abnormalities on mammogram and ultrasound of the right breast.  She reportedly had mammogram and ultrasound done on 10/04/2017.  A biopsy was offered.  She declined it.  Denies any new onset pains.  Continues to be active.  Reportedly her pacemaker is doing well.  She is trying 3 different blood pressure medications to see which one she can tolerate best.  She is also taking torsemide 20 mg daily and atenolol 25 mg daily.  Complains of mild fatigue.    REVIEW OF SYSTEMS:  Review of Systems  Constitutional: Positive for fatigue.  Hematological: Bruises/bleeds easily.  All other systems reviewed and are negative.    PAST MEDICAL/SURGICAL HISTORY:  Past Medical History:  Diagnosis Date  . Asthma    spirometry (2011)- mild ventilary defect  . Bone spur    "heels, knees, left shoulder" (06/22/2017)  . Breast cancer, left breast ([place]) dx'd 03/2011  . Calcification of left breast   . Chronic bronchitis ([place])   . COPD (chronic obstructive pulmonary disease) ([place])   . Dysrhythmia    palpatations  . Fatty liver 2013   enlarged  . GERD (gastroesophageal reflux disease)   . Heart  murmur    "very slight one" (06/22/2017)  . HSV (herpes simplex virus) infection   . Hyperlipidemia   . Hypertension   . 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 Lung nodule seen on imaging study 2013  . Lymphedema    Left arm  . Osteoarthritis    "all over" (06/22/2017)  . Pneumonia    "maybe once or twice" (06/22/2017)  . Presence of permanent cardiac pacemaker   . Shortness of breath    with exertion  . Sleep apnea    Stop Bang score of 5. Pt has had sleep study, but was shown to be negative for sleep apnea.  . Sliding hiatal hernia   . Type II diabetes mellitus ([place])   . Ventral hernia, unspecified, without mention of obstruction or gangrene    Past Surgical History:  Procedure Laterality Date  . BREAST BIOPSY Left 04/2011  . BREAST BIOPSY Left 06/22/13  . BREAST BIOPSY Left 09/12/2013   Procedure: BREAST BIOPSY WITH NEEDLE LOCALIZATION;  Surgeon: Jamesetta So, MD;  Location: [facility];  Service: General;  Laterality: Left;  . BREAST LUMPECTOMY Left 04/2011 X 2  . CATARACT EXTRACTION W/ INTRAOCULAR LENS  IMPLANT, BILATERAL Bilateral   . CATARACT EXTRACTION, BILATERAL     on different occassions   . [place]  . DILATION AND CURETTAGE OF UTERUS  11/06/2013  . HAND LIGAMENT RECONSTRUCTION  Right ~ 2010  . HYSTEROSCOPY W/D&C N/A 11/06/2013   Procedure: DILATATION AND CURETTAGE /HYSTEROSCOPY;  Surgeon: Jonnie Kind, MD;  Location: [facility];  Service: Gynecology;  Laterality: N/A;  . INSERT / REPLACE / REMOVE PACEMAKER  06/22/2017  . LUNG LOBECTOMY Right 1988   Fungal Infection  . PACEMAKER IMPLANT N/A 06/22/2017   Procedure: PACEMAKER IMPLANT;  Surgeon: Deboraha Sprang, MD;  Location: [place] [facility];  Service: Cardiovascular;  Laterality: N/A;  . POLYPECTOMY N/A 11/06/2013   Procedure: POLYPECTOMY (REMOVAL ENDOMETRIAL POLYP);  Surgeon: Jonnie Kind, MD;  Location: [facility];  Service: Gynecology;  Laterality: N/A;  . RECTOCELE REPAIR N/A  11/06/2013   Procedure: POSTERIOR REPAIR (RECTOCELE);  Surgeon: Jonnie Kind, MD;  Location: [facility];  Service: Gynecology;  Laterality: N/A;  . SHOULDER ARTHROSCOPY WITH OPEN ROTATOR CUFF REPAIR Left 07/26/2012  . TONSILLECTOMY  1949  . TUBAL LIGATION       SOCIAL HISTORY:  Social History   Socioeconomic History  . Marital status: Married    Spouse name: Not on file  . Number of children: Not on file  . Years of education: Not on file  . Highest education level: Not on file  Occupational History  . Occupation: retired     Comment: Product manager for [employer]  Social Needs  . Financial resource strain: Not on file  . Food insecurity:    Worry: Not on file    Inability: Not on file  . Transportation needs:    Medical: Not on file    Non-medical: Not on file  Tobacco Use  . Smoking status: Never Smoker  . Smokeless tobacco: Never Used  Substance and Sexual Activity  . Alcohol use: Yes    Alcohol/week: 0.0 oz    Comment: 06/22/2017 "might have a drink twice a year"  . Drug use: No  . Sexual activity: Not Currently    Birth control/protection: Post-menopausal  Lifestyle  . Physical activity:    Days per week: Not on file    Minutes per session: Not on file  . Stress: Not on file  Relationships  . Social connections:    Talks on phone: Not on file    Gets together: Not on file    Attends religious service: Not on file    Active member of club or organization: Not on file    Attends meetings of clubs or organizations: Not on file    Relationship status: Not on file  . Intimate partner violence:    Fear of current or ex partner: Not on file    Emotionally abused: Not on file    Physically abused: Not on file    Forced sexual activity: Not on file  Other Topics Concern  . Not on file  Social History Narrative  . Not on file    FAMILY HISTORY:  Family History  Problem Relation Age of Onset  . Hypertension Mother   . Heart disease Mother   . Diabetes  Mother   . Kidney disease Mother        ESRD  . Cancer Mother        Bladder  . Hypertension Father   . Hyperlipidemia Father   . Heart disease Father   . Diabetes Father   . Cancer Father        Stomach Cancer  . Diabetes Sister   . Heart disease Sister   . Alzheimer's disease Paternal Aunt   . Stroke  Maternal Grandfather   . Cancer Paternal Grandfather        stomach  . Cancer Maternal Uncle        prostate  . Cancer Paternal Uncle        stomach    CURRENT MEDICATIONS:  Outpatient Encounter Medications as of 10/14/2017  Medication Sig  . acetaminophen (TYLENOL) 500 MG tablet Take 500 mg by mouth as needed for moderate pain. Takes 2 when needed  . albuterol (PROVENTIL HFA;VENTOLIN HFA) 108 (90 Base) MCG/ACT inhaler Inhale 2 puffs into the lungs every 4 (four) hours as needed.  Vanita Ingles Leaf POWD Take 800 mg by mouth 2 (two) times daily.   . Ascorbic Acid (VITAMIN C) 1000 MG tablet Take 1,000 mg by mouth 2 (two) times daily.  Marland Kitchen atenolol (TENORMIN) 25 MG tablet TAKE 1 TABLET BY MOUTH DAILY  . b complex vitamins tablet Take 1 tablet by mouth 2 (two) times daily.   . Cholecalciferol (VITAMIN D3) 2000 UNITS TABS Take 2,000 Units by mouth 2 (two) times daily.  Mariane Baumgarten Calcium (STOOL SOFTENER PO) Take 1 capsule by mouth as needed (constipation).   . Echinacea 380 MG CAPS Take 1 capsule by mouth as needed (cold/flue).  . fluocinonide cream (LIDEX) 0.94 % Apply 1 application topically 2 (two) times daily as needed (poison ivy/bites/hives).  . Ginkgo Biloba 120 MG CAPS Take 1 capsule by mouth daily.   Marland Kitchen glipiZIDE (GLUCOTROL) 10 MG tablet TAKE 1 TABLET BY MOUTH TWICE DAILY BEFORE A MEAL  . KRILL OIL PO Take 1 capsule by mouth 2 (two) times daily.  . Melatonin 3 MG TABS Take 3 mg by mouth at bedtime as needed and may repeat dose one time if needed (sleep).   . metFORMIN (GLUCOPHAGE) 1000 MG tablet TAKE 1 TABLET BY MOUTH TWICE DAILY WITH A MEAL  . metoprolol succinate (TOPROL-XL)  25 MG 24 hr tablet Take 1 tablet (25 mg total) by mouth daily. Take with or immediately following a meal.  . mometasone-formoterol (DULERA) 100-5 MCG/ACT AERO Inhale 2 puffs into the lungs 2 (two) times daily.  . Multiple Vitamin (MULTIVITAMIN WITH MINERALS) TABS tablet Take 1 tablet by mouth daily.  . NONFORMULARY OR COMPOUNDED ITEM Apply 1 application topically 2 (two) times daily as needed. Reported on 06/10/2015  . propranolol ER (INDERAL LA) 60 MG 24 hr capsule Take 1 capsule (60 mg total) by mouth daily.  . rosuvastatin (CRESTOR) 5 MG tablet TAKE 1 TABLET(5 MG) BY MOUTH DAILY  . simethicone (MYLICON) 80 MG chewable tablet Chew 80 mg by mouth as needed for flatulence.   Marland Kitchen tiZANidine (ZANAFLEX) 4 MG tablet TAKE 1 TABLET BY MOUTH EVERY NIGHT AT BEDTIME AS NEEDED FOR MUSCLE SPASMS  . torsemide (DEMADEX) 20 MG tablet Take 1 tablet (20 mg total) by mouth daily.  Marland Kitchen triamcinolone cream (KENALOG) 0.1 % Apply 1 application topically 2 (two) times daily.  . TRULICITY 0.76 KG/8.8PJ SOPN INJECT 1 PEN UNDER THE SKIN EVERY WEEK   No facility-administered encounter medications on file as of 10/14/2017.     ALLERGIES:  Allergies  Allergen Reactions  . Arimidex [Anastrozole] Other (See Comments)    Arthralgias and myalgias.  Improved with 3 week hiatus from drug.  Categorical side effect of drug class.  . Adhesive [Tape] Other (See Comments)    "red and blistered"  . Lisinopril     Dry hacking cough and tickle  . Tetracyclines & Related Other (See Comments)    unknown  PHYSICAL EXAM:  ECOG Performance status: 1  Vitals:   10/14/17 1420  BP: (!) 154/70  Pulse: 65  Resp: 20  Temp: 98.7 F (37.1 C)  SpO2: 98%   Filed Weights   10/14/17 1420  Weight: 226 lb (102.5 kg)    Physical Exam   LABORATORY DATA:  I have reviewed the labs as listed.  CBC    Component Value Date/Time   WBC 15.4 (H) 07/08/2017 0945   RBC 4.57 07/08/2017 0945   HGB 12.2 07/08/2017 0945   HCT 36.9  07/08/2017 0945   PLT 284 07/08/2017 0945   MCV 80.7 07/08/2017 0945   MCH 26.7 (L) 07/08/2017 0945   MCHC 33.1 07/08/2017 0945   RDW 13.3 07/08/2017 0945   LYMPHSABS 2,710 07/08/2017 0945   MONOABS 0.7 06/07/2017 1420   EOSABS 15 07/08/2017 0945   BASOSABS 31 07/08/2017 0945   CMP Latest Ref Rng & Units 10/10/2017 07/08/2017 06/07/2017  Glucose 65 - 139 mg/dL 231(H) 110(H) 167(H)  BUN 7 - 25 mg/dL 20 26(H) 26(H)  Creatinine 0.60 - 0.93 mg/dL 0.94(H) 0.96(H) 1.03(H)  Sodium 135 - 146 mmol/L 144 139 142  Potassium 3.5 - 5.3 mmol/L 3.8 4.4 3.6  Chloride 98 - 110 mmol/L 105 102 103  CO2 20 - 32 mmol/L 29 28 26   Calcium 8.6 - 10.4 mg/dL 9.5 9.9 9.6  Total Protein 6.1 - 8.1 g/dL - 6.7 -  Total Bilirubin 0.2 - 1.2 mg/dL - 0.4 -  Alkaline Phos 33 - 130 U/L - - -  AST 10 - 35 U/L - 22 -  ALT 6 - 29 U/L - 19 -       DIAGNOSTIC IMAGING:  I have independently reviewed right mammogram and ultrasound dated 10/04/2017 and agree with the findings.     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  Stage I left breast IDC, ER/PR positive, HER-2 negative: Oncotype DX score of 0, declined radiation therapy, Anastrozole from March 2013 through December 2013, discontinued secondary to arthralgias and myalgias, took tamoxifen for 4 years from January 2014 through January 2018. -Left breast ADH and ALH, status post lumpectomy on 09/12/2013 - Mammogram of the right breast on 02/08/2017 shows new finding, did not have biopsy done as recommended, missed mammogram appointment in April of this year secondary to pacemaker placement on the right side. -She had right breast mammogram and ultrasound done on 10/04/2017 which was BI-RADS Category 4 with indeterminate hyperdense mass without ultrasound correlate.  Stereotactic biopsy was recommended.  However she declined.  She tells me that she had too many biopsies on the left breast in the past. -I have scheduled her for a  follow-up mammogram in 6 months along with an ultrasound.  2.  Family history: Her dad, uncle and paternal grandfather had stomach cancers.      Orders placed this encounter:  Orders Placed This Encounter  Procedures  . MM Digital Diagnostic Unilat R      Derek Jack, MD [place] [phone]

## 2017-10-14 NOTE — Assessment & Plan Note (Signed)
1.  Stage I left breast IDC, ER/PR positive, HER-2 negative: Oncotype DX score of 0, declined radiation therapy, Anastrozole from March 2013 through December 2013, discontinued secondary to arthralgias and myalgias, took tamoxifen for 4 years from January 2014 through January 2018. -Left breast ADH and ALH, status post lumpectomy on 09/12/2013 - Mammogram of the right breast on 02/08/2017 shows new finding, did not have biopsy done as recommended, missed mammogram appointment in April of this year secondary to pacemaker placement on the right side. -She had right breast mammogram and ultrasound done on 10/04/2017 which was BI-RADS Category 4 with indeterminate hyperdense mass without ultrasound correlate.  Stereotactic biopsy was recommended.  However she declined.  She tells me that she had too many biopsies on the left breast in the past. -I have scheduled her for a follow-up mammogram in 6 months along with an ultrasound.  2.  Family history: Her dad, uncle and paternal grandfather had stomach cancers.

## 2017-10-15 ENCOUNTER — Other Ambulatory Visit: Payer: Self-pay | Admitting: Internal Medicine

## 2017-10-16 ENCOUNTER — Other Ambulatory Visit: Payer: Self-pay | Admitting: Family Medicine

## 2017-10-20 ENCOUNTER — Other Ambulatory Visit: Payer: Self-pay | Admitting: Family Medicine

## 2017-10-25 ENCOUNTER — Ambulatory Visit (HOSPITAL_COMMUNITY): Payer: Medicare Other

## 2017-11-02 ENCOUNTER — Other Ambulatory Visit: Payer: Self-pay | Admitting: *Deleted

## 2017-11-02 DIAGNOSIS — K760 Fatty (change of) liver, not elsewhere classified: Secondary | ICD-10-CM

## 2017-11-02 DIAGNOSIS — Z0000 Encounter for general adult medical examination without abnormal findings: Secondary | ICD-10-CM

## 2017-11-02 DIAGNOSIS — E1143 Type 2 diabetes mellitus with diabetic autonomic (poly)neuropathy: Secondary | ICD-10-CM

## 2017-11-02 DIAGNOSIS — E785 Hyperlipidemia, unspecified: Secondary | ICD-10-CM

## 2017-11-02 DIAGNOSIS — N182 Chronic kidney disease, stage 2 (mild): Secondary | ICD-10-CM

## 2017-11-02 DIAGNOSIS — I10 Essential (primary) hypertension: Secondary | ICD-10-CM

## 2017-11-03 ENCOUNTER — Other Ambulatory Visit: Payer: Self-pay

## 2017-11-03 MED ORDER — METOPROLOL SUCCINATE ER 25 MG PO TB24: 25.0000 mg | ORAL_TABLET | Freq: Every day | ORAL | 2 refills | Status: DC

## 2017-11-09 ENCOUNTER — Other Ambulatory Visit: Payer: Self-pay | Admitting: Family Medicine

## 2017-11-09 DIAGNOSIS — N182 Chronic kidney disease, stage 2 (mild): Secondary | ICD-10-CM | POA: Diagnosis not present

## 2017-11-09 DIAGNOSIS — E1143 Type 2 diabetes mellitus with diabetic autonomic (poly)neuropathy: Secondary | ICD-10-CM | POA: Diagnosis not present

## 2017-11-09 DIAGNOSIS — K760 Fatty (change of) liver, not elsewhere classified: Secondary | ICD-10-CM | POA: Diagnosis not present

## 2017-11-09 DIAGNOSIS — Z0000 Encounter for general adult medical examination without abnormal findings: Secondary | ICD-10-CM | POA: Diagnosis not present

## 2017-11-09 DIAGNOSIS — I10 Essential (primary) hypertension: Secondary | ICD-10-CM | POA: Diagnosis not present

## 2017-11-09 DIAGNOSIS — E785 Hyperlipidemia, unspecified: Secondary | ICD-10-CM | POA: Diagnosis not present

## 2017-11-10 ENCOUNTER — Other Ambulatory Visit: Payer: Self-pay | Admitting: Family Medicine

## 2017-11-10 LAB — COMPLETE METABOLIC PANEL WITH GFR
AG RATIO: 1.5 (calc) (ref 1.0–2.5)
ALKALINE PHOSPHATASE (APISO): 44 U/L (ref 33–130)
ALT: 12 U/L (ref 6–29)
AST: 19 U/L (ref 10–35)
Albumin: 3.7 g/dL (ref 3.6–5.1)
BILIRUBIN TOTAL: 0.4 mg/dL (ref 0.2–1.2)
BUN: 17 mg/dL (ref 7–25)
CALCIUM: 9.4 mg/dL (ref 8.6–10.4)
CO2: 30 mmol/L (ref 20–32)
Chloride: 106 mmol/L (ref 98–110)
Creat: 0.91 mg/dL (ref 0.60–0.93)
GFR, EST NON AFRICAN AMERICAN: 62 mL/min/{1.73_m2} (ref >=60)
GFR, Est African American: 72 mL/min/{1.73_m2} (ref >=60)
GLOBULIN: 2.5 g/dL (ref 1.9–3.7)
Glucose, Bld: 109 mg/dL — ABNORMAL HIGH (ref 65–99)
Potassium: 3.9 mmol/L (ref 3.5–5.3)
SODIUM: 143 mmol/L (ref 135–146)
Total Protein: 6.2 g/dL (ref 6.1–8.1)

## 2017-11-10 LAB — LIPID PANEL
CHOL/HDL RATIO: 2.8 (calc) (ref <5.0)
Cholesterol: 128 mg/dL (ref <200)
HDL: 45 mg/dL — AB (ref >50)
LDL Cholesterol (Calc): 56 mg/dL (calc)
NON-HDL CHOLESTEROL (CALC): 83 mg/dL (ref <130)
Triglycerides: 197 mg/dL — ABNORMAL HIGH (ref <150)

## 2017-11-10 LAB — CBC WITH DIFFERENTIAL/PLATELET
Basophils Absolute: 18 cells/uL (ref 0–200)
Basophils Relative: 0.2 %
EOS PCT: 3.3 %
Eosinophils Absolute: 304 cells/uL (ref 15–500)
HCT: 38.5 % (ref 35.0–45.0)
Hemoglobin: 12.5 g/dL (ref 11.7–15.5)
Lymphs Abs: 2374 cells/uL (ref 850–3900)
MCH: 26.6 pg — ABNORMAL LOW (ref 27.0–33.0)
MCHC: 32.5 g/dL (ref 32.0–36.0)
MCV: 81.9 fL (ref 80.0–100.0)
MONOS PCT: 5.9 %
MPV: 11.1 fL (ref 7.5–12.5)
NEUTROS PCT: 64.8 %
Neutro Abs: 5962 cells/uL (ref 1500–7800)
Platelets: 221 10*3/uL (ref 140–400)
RBC: 4.7 10*6/uL (ref 3.80–5.10)
RDW: 14.1 % (ref 11.0–15.0)
Total Lymphocyte: 25.8 %
WBC mixed population: 543 cells/uL (ref 200–950)
WBC: 9.2 10*3/uL (ref 3.8–10.8)

## 2017-11-10 LAB — HEMOGLOBIN A1C
EAG (MMOL/L): 8.4 (calc)
Hgb A1c MFr Bld: 6.9 % of total Hgb — ABNORMAL HIGH (ref <5.7)
Mean Plasma Glucose: 151 (calc)

## 2017-11-11 ENCOUNTER — Encounter: Payer: Self-pay | Admitting: Family Medicine

## 2017-11-11 ENCOUNTER — Other Ambulatory Visit: Payer: Self-pay

## 2017-11-11 ENCOUNTER — Ambulatory Visit (INDEPENDENT_AMBULATORY_CARE_PROVIDER_SITE_OTHER): Payer: Medicare Other | Admitting: Family Medicine

## 2017-11-11 VITALS — BP 132/74 | HR 83 | Temp 97.9°F | Resp 14 | Ht 65.0 in | Wt 223.0 lb

## 2017-11-11 DIAGNOSIS — I10 Essential (primary) hypertension: Secondary | ICD-10-CM

## 2017-11-11 DIAGNOSIS — E1143 Type 2 diabetes mellitus with diabetic autonomic (poly)neuropathy: Secondary | ICD-10-CM

## 2017-11-11 DIAGNOSIS — E782 Mixed hyperlipidemia: Secondary | ICD-10-CM

## 2017-11-11 DIAGNOSIS — N182 Chronic kidney disease, stage 2 (mild): Secondary | ICD-10-CM

## 2017-11-11 DIAGNOSIS — E1141 Type 2 diabetes mellitus with diabetic mononeuropathy: Secondary | ICD-10-CM | POA: Diagnosis not present

## 2017-11-11 DIAGNOSIS — R42 Dizziness and giddiness: Secondary | ICD-10-CM | POA: Diagnosis not present

## 2017-11-11 NOTE — Progress Notes (Signed)
   Subjective:    Patient ID: Julie Clay, female    DOB: 09-09-1940, 77 y.o.   MRN: [id number]  Patient presents for Follow-up (has had labs) Pt here to f/u chronic medical problems   Blood pressure- blood pressure has been good  Has pacer - seen by Dr. Caryl Comes , now on metoprolol 25mg  XR but still getting the lightheaded,"woozy feeling when she is turning over in bed/night time , If laying in bed on left side and turns to right gets gets dizzy headed  this started since metoprolol  DM- 6.9% on recent labs reviewed at the bedside. No hypoglycemia symptoms , no meter with her today   Medications and fasting labs reviewed     Review Of Systems:  GEN- denies fatigue, fever, weight loss,weakness, recent illness HEENT- denies eye drainage, change in vision, nasal discharge, CVS- denies chest pain, palpitations RESP- denies SOB, cough, wheeze ABD- denies N/V, change in stools, abd pain GU- denies dysuria, hematuria, dribbling, incontinence MSK- + joint pain, muscle aches, injury Neuro- denies headache, +dizziness, syncope, seizure activity       Objective:    BP 132/74   Pulse 83   Temp 97.9 F (36.6 C) (Oral)   Resp 14   Ht 5\' 5"  (1.651 m)   Wt 223 lb (101.2 kg)   SpO2 98%   BMI 37.11 kg/m  GEN- NAD, alert and oriented x3 HEENT- PERRL, EOMI, non injected sclera, pink conjunctiva, MMM, oropharynx clear Neck- Supple, no thyromegaly CVS- RRR, no murmur,pacer  RESP-CTAB ABD-NABS,soft,NT,ND Neuro-CNII-XII in tact, no focal deficits  EXT- No edema Pulses- Radial, DP- 2+        Assessment & Plan:      Problem List Items Addressed This Visit      Unprioritized   CKD (chronic kidney disease), stage II   Diabetic neuropathy (HCC)   DM (diabetes mellitus) ([place]) - Primary    Diabetes at goal with A1C 6.9% and no hypoglycemia symptoms No change to medications      Essential hypertension, benign    Controlled, however in setting of dizzy spells will try lower dose  of Toprol, decrease to 12.5mg  daily for a few weeks      Hyperlipidemia    TG back up, watch diet, no change to crestor      Morbidly obese ([place])    Other Visit Diagnoses    Dizzy spells       Decrease beta blocker, BP controlled, no sign of infection, possible Eustachean tube/ mild vertigo but does not last long enough      Note: This dictation was prepared with Dragon dictation along with smaller phrase technology. Any transcriptional errors that result from this process are unintentional.

## 2017-11-11 NOTE — Patient Instructions (Addendum)
Try taking 1/2 tablet of the metoprolol 12.5mg  once a day for few weeks No other changes to meds  F/U December/Jan for Physical

## 2017-11-14 ENCOUNTER — Encounter: Payer: Self-pay | Admitting: Family Medicine

## 2017-11-14 NOTE — Assessment & Plan Note (Signed)
Controlled, however in setting of dizzy spells will try lower dose of Toprol, decrease to 12.5mg  daily for a few weeks

## 2017-11-14 NOTE — Assessment & Plan Note (Signed)
TG back up, watch diet, no change to crestor

## 2017-11-14 NOTE — Assessment & Plan Note (Signed)
Diabetes at goal with A1C 6.9% and no hypoglycemia symptoms No change to medications

## 2017-11-15 ENCOUNTER — Other Ambulatory Visit: Payer: Self-pay | Admitting: Family Medicine

## 2017-12-02 DIAGNOSIS — M25562 Pain in left knee: Secondary | ICD-10-CM | POA: Diagnosis not present

## 2017-12-05 ENCOUNTER — Other Ambulatory Visit: Payer: Self-pay | Admitting: Family Medicine

## 2017-12-16 ENCOUNTER — Other Ambulatory Visit: Payer: Self-pay | Admitting: Family Medicine

## 2017-12-26 ENCOUNTER — Ambulatory Visit (INDEPENDENT_AMBULATORY_CARE_PROVIDER_SITE_OTHER): Payer: Medicare Other | Admitting: *Deleted

## 2017-12-26 ENCOUNTER — Encounter: Payer: Self-pay | Admitting: Nurse Practitioner

## 2017-12-26 DIAGNOSIS — I441 Atrioventricular block, second degree: Secondary | ICD-10-CM | POA: Diagnosis not present

## 2017-12-26 NOTE — Progress Notes (Signed)
Electrophysiology Office Note Date: 12/28/2017  ID:  [name], DOB 11-24-1940, MRN [id number]  PCP: Alycia Rossetti, MD Electrophysiologist: Caryl Comes  CC: Pacemaker follow-up  Julie Clay is a 77 y.o. female seen today for Dr Caryl Comes.  She presents today for routine electrophysiology followup.  Since last being seen in our clinic, the patient reports no significant improvement. She continues to struggle with exercise tolerance, fatigue, sleep inconsistency and orthostatic tachycardia.  She denies chest pain, PND, orthopnea, nausea, vomiting, syncope, weight gain, or early satiety.  Device History: STJ dual chamber PPM implanted 2019 for Mobitz II   Past Medical History:  Diagnosis Date  . Asthma    spirometry (2011)- mild ventilary defect  . Bone spur    "heels, knees, left shoulder" (06/22/2017)  . Breast cancer, left breast ([place]) dx'd 03/2011  . Chronic bronchitis ([place])   . COPD (chronic obstructive pulmonary disease) ([place])   . Fatty liver 2013   enlarged  . GERD (gastroesophageal reflux disease)   . HSV (herpes simplex virus) infection   . Hyperlipidemia   . Hypertension   . Lung nodule seen on imaging study 2013  . Lymphedema    Left arm  . 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 Osteoarthritis    "all over" (06/22/2017)  . Sleep apnea    Stop Bang score of 5. Pt has had sleep study, but was shown to be negative for sleep apnea.  . Type II diabetes mellitus ([place])   . Ventral hernia, unspecified, without mention of obstruction or gangrene    Past Surgical History:  Procedure Laterality Date  . BREAST BIOPSY Left 04/2011  . BREAST BIOPSY Left 06/22/13  . BREAST BIOPSY Left 09/12/2013   Procedure: BREAST BIOPSY WITH NEEDLE LOCALIZATION;  Surgeon: Jamesetta So, MD;  Location: [facility];  Service: General;  Laterality: Left;  . BREAST LUMPECTOMY Left 04/2011 X 2  . CATARACT EXTRACTION W/ INTRAOCULAR LENS  IMPLANT, BILATERAL Bilateral   . CATARACT EXTRACTION,  BILATERAL     on different occassions   . [place]  . DILATION AND CURETTAGE OF UTERUS  11/06/2013  . HAND LIGAMENT RECONSTRUCTION Right ~ 2010  . HYSTEROSCOPY W/D&C N/A 11/06/2013   Procedure: DILATATION AND CURETTAGE /HYSTEROSCOPY;  Surgeon: Jonnie Kind, MD;  Location: [facility];  Service: Gynecology;  Laterality: N/A;  . INSERT / REPLACE / REMOVE PACEMAKER  06/22/2017  . LUNG LOBECTOMY Right 1988   Fungal Infection  . PACEMAKER IMPLANT N/A 06/22/2017   Procedure: PACEMAKER IMPLANT;  Surgeon: Deboraha Sprang, MD;  Location: [facility];  Service: Cardiovascular;  Laterality: N/A;  . POLYPECTOMY N/A 11/06/2013   Procedure: POLYPECTOMY (REMOVAL ENDOMETRIAL POLYP);  Surgeon: Jonnie Kind, MD;  Location: [facility];  Service: Gynecology;  Laterality: N/A;  . RECTOCELE REPAIR N/A 11/06/2013   Procedure: POSTERIOR REPAIR (RECTOCELE);  Surgeon: Jonnie Kind, MD;  Location: [facility];  Service: Gynecology;  Laterality: N/A;  . SHOULDER ARTHROSCOPY WITH OPEN ROTATOR CUFF REPAIR Left 07/26/2012  . TONSILLECTOMY  1949  . TUBAL LIGATION      Current Outpatient Medications  Medication Sig Dispense Refill  . acetaminophen (TYLENOL) 500 MG tablet Take 500 mg by mouth as needed for moderate pain. Takes 2 when needed    . albuterol (PROVENTIL HFA;VENTOLIN HFA) 108 (90 Base) MCG/ACT inhaler Inhale 2 puffs into the lungs every 4 (four) hours as needed. 6.7 g  6  . Aloe Vera Leaf POWD Take 800 mg by mouth 2 (two) times daily.     . Ascorbic Acid (VITAMIN C) 1000 MG tablet Take 1,000 mg by mouth 2 (two) times daily.    Marland Kitchen b complex vitamins tablet Take 1 tablet by mouth 2 (two) times daily.     . Cholecalciferol (VITAMIN D3) 2000 UNITS TABS Take 2,000 Units by mouth 2 (two) times daily.    Mariane Baumgarten Calcium (STOOL SOFTENER PO) Take 1 capsule by mouth as needed (constipation).     . Echinacea 380 MG CAPS Take 1 capsule by mouth as needed (cold/flue).    . fluocinonide cream (LIDEX) 7.40 %  Apply 1 application topically 2 (two) times daily as needed (poison ivy/bites/hives). 30 g 11  . Ginkgo Biloba 120 MG CAPS Take 1 capsule by mouth daily.     Marland Kitchen glipiZIDE (GLUCOTROL) 10 MG tablet TAKE 1 TABLET BY MOUTH TWICE DAILY BEFORE A MEAL 180 tablet 0  . glipiZIDE (GLUCOTROL) 10 MG tablet TAKE 1 TABLET BY MOUTH TWICE DAILY BEFORE A MEAL 180 tablet 0  . KRILL OIL PO Take 1 capsule by mouth 2 (two) times daily.    . Melatonin 3 MG TABS Take 3 mg by mouth at bedtime as needed and may repeat dose one time if needed (sleep).     . metFORMIN (GLUCOPHAGE) 1000 MG tablet TAKE 1 TABLET BY MOUTH TWICE DAILY WITH A MEAL 180 tablet 0  . metoprolol succinate (TOPROL-XL) 25 MG 24 hr tablet Take 1 tablet (25 mg total) by mouth daily. 90 tablet 2  . mometasone-formoterol (DULERA) 100-5 MCG/ACT AERO Inhale 2 puffs into the lungs 2 (two) times daily. 1 Inhaler 6  . Multiple Vitamin (MULTIVITAMIN WITH MINERALS) TABS tablet Take 1 tablet by mouth daily.    . NONFORMULARY OR COMPOUNDED ITEM Apply 1 application topically 2 (two) times daily as needed. Reported on 06/10/2015    . rosuvastatin (CRESTOR) 5 MG tablet TAKE 1 TABLET(5 MG) BY MOUTH DAILY 90 tablet 0  . simethicone (MYLICON) 80 MG chewable tablet Chew 80 mg by mouth as needed for flatulence.     Marland Kitchen tiZANidine (ZANAFLEX) 4 MG tablet TAKE 1 TABLET BY MOUTH EVERY NIGHT AT BEDTIME AS NEEDED FOR MUSCLE SPASMS 90 tablet 0  . torsemide (DEMADEX) 20 MG tablet Take 1 tablet (20 mg total) by mouth daily. 90 tablet 3  . triamcinolone cream (KENALOG) 0.1 % Apply 1 application topically 2 (two) times daily. 30 g 0  . TRULICITY 8.14 GY/1.8HU SOPN INJECT 1 PEN INTO THE SKIN ONCE EVERY WEEK 2 mL 0   No current facility-administered medications for this visit.     Allergies:   Arimidex [anastrozole]; Adhesive [tape]; Lisinopril; and Tetracyclines & related   Social History: Social History   Socioeconomic History  . Marital status: Married    Spouse name: Not on  file  . Number of children: Not on file  . Years of education: Not on file  . Highest education level: Not on file  Occupational History  . Occupation: retired     Comment: Product manager for [employer]  Social Needs  . Financial resource strain: Not on file  . Food insecurity:    Worry: Not on file    Inability: Not on file  . Transportation needs:    Medical: Not on file    Non-medical: Not on file  Tobacco Use  . Smoking status: Never Smoker  . Smokeless tobacco: Never  Used  Substance and Sexual Activity  . Alcohol use: Yes    Alcohol/week: 0.0 standard drinks    Comment: 06/22/2017 "might have a drink twice a year"  . Drug use: No  . Sexual activity: Not Currently    Birth control/protection: Post-menopausal  Lifestyle  . Physical activity:    Days per week: Not on file    Minutes per session: Not on file  . Stress: Not on file  Relationships  . Social connections:    Talks on phone: Not on file    Gets together: Not on file    Attends religious service: Not on file    Active member of club or organization: Not on file    Attends meetings of clubs or organizations: Not on file    Relationship status: Not on file  . Intimate partner violence:    Fear of current or ex partner: Not on file    Emotionally abused: Not on file    Physically abused: Not on file    Forced sexual activity: Not on file  Other Topics Concern  . Not on file  Social History Narrative  . Not on file    Family History: Family History  Problem Relation Age of Onset  . Hypertension Mother   . Heart disease Mother   . Diabetes Mother   . Kidney disease Mother        ESRD  . Cancer Mother        Bladder  . Hypertension Father   . Hyperlipidemia Father   . Heart disease Father   . Diabetes Father   . Cancer Father        Stomach Cancer  . Diabetes Sister   . Heart disease Sister   . Alzheimer's disease Paternal Aunt   . Stroke Maternal Grandfather   . Cancer Paternal  Grandfather        stomach  . Cancer Maternal Uncle        prostate  . Cancer Paternal Uncle        stomach     Review of Systems: All other systems reviewed and are otherwise negative except as noted above.   Physical Exam: VS:  BP 120/76   Pulse 67   Ht 5\' 5"  (1.651 m)   Wt 221 lb (100.2 kg)   SpO2 97%   BMI 36.78 kg/m  , BMI Body mass index is 36.78 kg/m.  GEN- The patient is obese appearing, alert and oriented x 3 today.   HEENT: normocephalic, atraumatic; sclera clear, conjunctiva pink; hearing intact; oropharynx clear; neck supple  Lungs- Clear to ausculation bilaterally, normal work of breathing.  No wheezes, rales, rhonchi Heart- Regular rate and rhythm  GI- soft, non-tender, non-distended, bowel sounds present  Extremities- no clubbing, cyanosis, or edema  MS- no significant deformity or atrophy Skin- warm and dry, no rash or lesion; PPM pocket well healed Psych- euthymic mood, full affect Neuro- strength and sensation are intact  PPM Interrogation- reviewed in detail today,  See PACEART report  EKG:  EKG is not ordered today.  Recent Labs: 05/31/2017: Magnesium 1.7; TSH 3.42 11/09/2017: ALT 12; BUN 17; Creat 0.91; Hemoglobin 12.5; Platelets 221; Potassium 3.9; Sodium 143   Wt Readings from Last 3 Encounters:  12/28/17 221 lb (100.2 kg)  11/11/17 223 lb (101.2 kg)  10/14/17 226 lb (102.5 kg)     Other studies Reviewed: Additional studies/ records that were reviewed today include: Dr Olin Pia office notes   Assessment and  Plan:  1.  Mobitz II Normal PPM function See Pace Art report No changes today  2.  Sinus tachycardia Histograms still slightly right shifted by device interrogation today She tolerated metoprolol the best - but not great Will leave on low dose for now  3.  Chronic diastolic heart failure Stable No change required today  4.  Orthostatic tachycardia/exercise intolerance/fatigue Unclear cause I think deconditioning is a big  part I have encouraged regular aerobic activity - she is limited by hip pain. We discussed water aerobics. PCP has checked blood count, TSH   Current medicines are reviewed at length with the patient today.   The patient does not have concerns regarding her medicines.  The following changes were made today:  none  Labs/ tests ordered today include:  Orders Placed This Encounter  Procedures  . CUP PACEART INCLINIC DEVICE CHECK     Disposition:   Follow up with Delilah Shan, Dr Caryl Comes 3 months     Signed, Chanetta Marshall, NP 12/28/2017 12:33 PM  [facility] [address] [place] [address] [phone] (office) [phone] (fax)

## 2017-12-26 NOTE — Progress Notes (Signed)
Remote pacemaker transmission.   

## 2017-12-27 ENCOUNTER — Encounter: Payer: Self-pay | Admitting: Cardiology

## 2017-12-28 ENCOUNTER — Encounter: Payer: Self-pay | Admitting: Nurse Practitioner

## 2017-12-28 ENCOUNTER — Ambulatory Visit (INDEPENDENT_AMBULATORY_CARE_PROVIDER_SITE_OTHER): Payer: Medicare Other | Admitting: Nurse Practitioner

## 2017-12-28 VITALS — BP 120/76 | HR 67 | Ht 65.0 in | Wt 221.0 lb

## 2017-12-28 DIAGNOSIS — I441 Atrioventricular block, second degree: Secondary | ICD-10-CM | POA: Diagnosis not present

## 2017-12-28 DIAGNOSIS — I5032 Chronic diastolic (congestive) heart failure: Secondary | ICD-10-CM

## 2017-12-28 DIAGNOSIS — R000 Tachycardia, unspecified: Secondary | ICD-10-CM | POA: Diagnosis not present

## 2017-12-28 LAB — CUP PACEART INCLINIC DEVICE CHECK
Implantable Lead Implant Date: 20190306
Implantable Pulse Generator Implant Date: 20190306
MDC IDC LEAD IMPLANT DT: 20190306
MDC IDC LEAD LOCATION: 753859
MDC IDC LEAD LOCATION: 753860
MDC IDC PG SERIAL: 9001027
MDC IDC SESS DTM: 20190911115444
Pulse Gen Model: 2272

## 2017-12-28 NOTE — Patient Instructions (Addendum)
Medication Instructions:   Your physician recommends that you continue on your current medications as directed. Please refer to the Current Medication list given to you today.   If you need a refill on your cardiac medications before your next appointment, please call your pharmacy.  Labwork: NONE ORDERED  TODAY    Testing/Procedures: NONE ORDERED  TODAY    Follow-Up:  3 MONTHS WITH DR Caryl Comes    Remote monitoring is used to monitor your Pacemaker of ICD from home. This monitoring reduces the number of office visits required to check your device to one time per year. It allows [place] to keep an eye on the functioning of your device to ensure it is working properly. You are scheduled for a device check from home on .  03-27-18 You may send your transmission at any time that day. If you have a wireless device, the transmission will be sent automatically. After your physician reviews your transmission, you will receive a postcard with your next transmission date.     Any Other Special Instructions Will Be Listed Below (If Applicable).

## 2017-12-29 ENCOUNTER — Telehealth: Payer: Self-pay | Admitting: Family Medicine

## 2017-12-29 MED ORDER — TRIAMCINOLONE ACETONIDE 0.1 % EX CREA: 1.0000 "application " | TOPICAL_CREAM | Freq: Two times a day (BID) | CUTANEOUS | 0 refills | Status: DC

## 2017-12-29 NOTE — Telephone Encounter (Signed)
Refill on triamcinolone acetonide to [facility] [address]

## 2018-01-02 ENCOUNTER — Telehealth: Payer: Self-pay | Admitting: Internal Medicine

## 2018-01-02 DIAGNOSIS — I5032 Chronic diastolic (congestive) heart failure: Secondary | ICD-10-CM

## 2018-01-02 NOTE — Telephone Encounter (Signed)
° ° °  Patient calling to request order for cardiac rehab at [facility], patient states A. Lynnell Jude asked her to call back if her insurance would cover rehab.

## 2018-01-14 ENCOUNTER — Other Ambulatory Visit: Payer: Self-pay | Admitting: Family Medicine

## 2018-01-17 LAB — CUP PACEART REMOTE DEVICE CHECK
Battery Remaining Longevity: 106 mo
Battery Remaining Percentage: 95.5 %
Brady Statistic AP VS Percent: 4.9 %
Brady Statistic AS VS Percent: 26 %
Implantable Lead Implant Date: 20190306
Implantable Lead Location: 753860
Implantable Lead Model: 5076
Implantable Pulse Generator Implant Date: 20190306
Lead Channel Impedance Value: 410 Ohm
Lead Channel Impedance Value: 480 Ohm
Lead Channel Pacing Threshold Amplitude: 0.5 V
Lead Channel Pacing Threshold Amplitude: 0.5 V
Lead Channel Pacing Threshold Pulse Width: 0.5 ms
Lead Channel Sensing Intrinsic Amplitude: 3.7 mV
Lead Channel Setting Pacing Amplitude: 2 V
Lead Channel Setting Pacing Amplitude: 2.5 V
Lead Channel Setting Sensing Sensitivity: 2 mV
MDC IDC LEAD IMPLANT DT: 20190306
MDC IDC LEAD LOCATION: 753859
MDC IDC MSMT BATTERY VOLTAGE: 3.01 V
MDC IDC MSMT LEADCHNL RA PACING THRESHOLD PULSEWIDTH: 0.5 ms
MDC IDC MSMT LEADCHNL RV SENSING INTR AMPL: 7 mV
MDC IDC PG SERIAL: 9001027
MDC IDC SESS DTM: 20190909060020
MDC IDC SET LEADCHNL RV PACING PULSEWIDTH: 0.5 ms
MDC IDC STAT BRADY AP VP PERCENT: 1.5 %
MDC IDC STAT BRADY AS VP PERCENT: 67 %
MDC IDC STAT BRADY RA PERCENT PACED: 6.3 %
MDC IDC STAT BRADY RV PERCENT PACED: 69 %
Pulse Gen Model: 2272

## 2018-01-17 NOTE — Telephone Encounter (Signed)
Requesting refill    Zanaflex  LOV: 11/11/17  LRF:  10/17/17

## 2018-01-18 ENCOUNTER — Telehealth: Payer: Self-pay | Admitting: Family Medicine

## 2018-01-18 MED ORDER — BLOOD GLUCOSE TEST VI STRP: ORAL_STRIP | 1 refills | Status: AC

## 2018-01-18 MED ORDER — LANCETS MISC: 1 refills | Status: AC

## 2018-01-18 MED ORDER — BLOOD GLUCOSE SYSTEM PAK KIT: PACK | 1 refills | Status: AC

## 2018-01-18 NOTE — Telephone Encounter (Signed)
Prescription sent to pharmacy.

## 2018-01-18 NOTE — Telephone Encounter (Signed)
Pt is calling today to see if Chanetta Marshall, NP would be ok with her starting rehab at [facility]. She would prefer cardiac rehab at [facility]. I told her I would check to see if that is available, if not pt is OK with standard PT.   Pt understands I will contact her back later this week or beginning of next week with Amber recommendations.

## 2018-01-18 NOTE — Telephone Encounter (Signed)
Cardiac rehab at [facility] would be great if insurance will approve  Chanetta Marshall, NP 01/18/2018 2:38 PM

## 2018-01-18 NOTE — Telephone Encounter (Signed)
pts insurance sent her a new diabetic testing meter the accu- chek aviva plus. She needs all of the testing supplies for this meter sent to [facility] [address] (I.e) strips, lancets and the fluid.

## 2018-01-18 NOTE — Telephone Encounter (Signed)
New Message:      Patient calling to request order for cardiac rehab at [facility], patient states A. Lynnell Jude asked her to call back if her insurance would cover rehab. This is the second time the pt has called about this due to her not receiving a call back the first time about this matter.

## 2018-01-19 NOTE — Telephone Encounter (Signed)
Spoke with pt who excited about Cardiac Rehab available at [facility]. A referral has been placed as her insurance has pre approved her for this. Pt had no additional questions or needs.

## 2018-01-26 NOTE — Addendum Note (Signed)
Addended by: Dollene Primrose on: 01/26/2018 10:46 AM   Modules accepted: Orders

## 2018-01-27 ENCOUNTER — Telehealth: Payer: Self-pay | Admitting: Internal Medicine

## 2018-01-27 NOTE — Telephone Encounter (Signed)
New Message:    Please call and ask  for Diane Coad in [place], concerning [employer].

## 2018-01-27 NOTE — Telephone Encounter (Signed)
Will remind SK to co-sign on Monday.

## 2018-01-29 NOTE — Telephone Encounter (Signed)
No problem.

## 2018-02-09 ENCOUNTER — Ambulatory Visit (INDEPENDENT_AMBULATORY_CARE_PROVIDER_SITE_OTHER): Payer: Medicare Other

## 2018-02-09 DIAGNOSIS — Z23 Encounter for immunization: Secondary | ICD-10-CM | POA: Diagnosis not present

## 2018-02-09 NOTE — Progress Notes (Signed)
Patient was in office for flu vaccine.Patient received the vaccine in her right deltoid. Patient tolerated well

## 2018-02-11 ENCOUNTER — Other Ambulatory Visit: Payer: Self-pay | Admitting: Family Medicine

## 2018-03-07 ENCOUNTER — Other Ambulatory Visit: Payer: Self-pay | Admitting: Family Medicine

## 2018-03-15 ENCOUNTER — Encounter (HOSPITAL_COMMUNITY)
Admission: RE | Admit: 2018-03-15 | Discharge: 2018-03-15 | Disposition: A | Discharge status: Home / Self Care | Payer: Medicare Other | Source: Ambulatory Visit | Attending: Internal Medicine | Admitting: Internal Medicine

## 2018-03-15 ENCOUNTER — Encounter (HOSPITAL_COMMUNITY): Payer: Self-pay

## 2018-03-15 VITALS — BP 122/68 | HR 93 | Ht 65.0 in | Wt 223.5 lb

## 2018-03-15 DIAGNOSIS — I5032 Chronic diastolic (congestive) heart failure: Secondary | ICD-10-CM | POA: Insufficient documentation

## 2018-03-15 NOTE — Progress Notes (Signed)
Cardiac/Pulmonary Rehab Medication Review by a Pharmacist  Does the patient  feel that his/her medications are working for him/her?  yes  Has the patient been experiencing any side effects to the medications prescribed?  Yes- torsemide urinary frequency  Tizanidine- unstable walking but cut dose down and fine now.  Does the patient measure his/her own blood pressure or blood glucose at home?  Sometimes- usually when feels there are issues.   Does the patient have any problems obtaining medications due to transportation or finances?   no  Understanding of regimen: excellent Understanding of indications: excellent Potential of compliance: excellent  Questions asked to Determine Patient Understanding of Medication Regimen:  1. What is the name of the medication?  2. What is the medication used for?  3. When should it be taken?  4. How much should be taken?  5. How will you take it?  6. What side effects should you report?  Understanding Defined as: Excellent: All questions above are correct Good: Questions 1-4 are correct Fair: Questions 1-2 are correct  Poor: 1 or none of the above questions are correct   Pharmacist comments: Overall, patient has a great understanding of her medications and high adherence.  Has typical side effects of urinary frequency to torsemide but better at lower dose.    Ramond Craver 03/15/2018 12:41 PM

## 2018-03-15 NOTE — Progress Notes (Signed)
Daily Session Note  Patient Details  Name: KASY IANNACONE MRN: [id number] Date of Birth: 1940/11/19 Referring Provider:     PULMONARY REHAB OTHER RESP ORIENTATION from 03/15/2018 in [place]  Referring Provider  Caryl Comes      Encounter Date: 03/15/2018  Check In: Session Check In - 03/15/18 1230      Check-In   Supervising physician immediately available to respond to emergencies  See telemetry face sheet for immediately available MD    Location  [facility]    Staff Present  Russella Dar, MS, EP, [facility], Exercise Physiologist;Debra Wynetta Emery, RN, [facility] Munch, Exercise Physiologist    Medication changes reported      No    Fall or balance concerns reported     No    Tobacco Cessation  --   Never Smoked   Warm-up and Cool-down  Performed as group-led instruction    Resistance Training Performed  Yes    VAD Patient?  No    PAD/SET Patient?  No      Pain Assessment   Currently in Pain?  No/denies    Pain Score  0-No pain    Multiple Pain Sites  No       Capillary Blood Glucose: No results found for this or any previous visit (from the past 24 hour(s)).  Exercise Prescription Changes - 03/15/18 1400      Home Exercise Plan   Plans to continue exercise at  Home (comment)    Frequency  Add 3 additional days to program exercise sessions.    Initial Home Exercises Provided  03/15/18       Social History   Tobacco Use  Smoking Status Never Smoker  Smokeless Tobacco Never Used    Goals Met:  Proper associated with RPD/PD & O2 Sat Independence with exercise equipment Exercise tolerated well Personal goals reviewed Queuing for purse lip breathing No report of cardiac concerns or symptoms Strength training completed today  Goals Unmet:  Not Applicable  Comments: Check out: 1430   Dr. Sinda Du is Medical Director for [facility].

## 2018-03-15 NOTE — Progress Notes (Signed)
Pulmonary Individual Treatment Plan  Patient Details  Name: Julie Clay MRN: [id number] Date of Birth: [birth year] Referring Provider:     PULMONARY REHAB OTHER RESP ORIENTATION from 03/15/2018 in [facility]  Referring Provider  Caryl Comes      Initial Encounter Date:    PULMONARY REHAB OTHER RESP ORIENTATION from 03/15/2018 in [facility]  Date  03/15/18      Visit Diagnosis: Chronic diastolic CHF (congestive heart failure) ([place])  Patient's Home Medications on Admission:   Current Outpatient Medications:  .  acetaminophen (TYLENOL) 500 MG tablet, Take 500 mg by mouth as needed for moderate pain. Takes 2 when needed, Disp: , Rfl:  .  albuterol (PROVENTIL HFA;VENTOLIN HFA) 108 (90 Base) MCG/ACT inhaler, Inhale 2 puffs into the lungs every 4 (four) hours as needed., Disp: 6.7 g, Rfl: 6 .  Aloe Vera Leaf POWD, Take 800 mg by mouth 2 (two) times daily. , Disp: , Rfl:  .  Ascorbic Acid (VITAMIN C) 1000 MG tablet, Take 1,000 mg by mouth 2 (two) times daily., Disp: , Rfl:  .  b complex vitamins tablet, Take 1 tablet by mouth 2 (two) times daily. , Disp: , Rfl:  .  Blood Glucose Monitoring Suppl (BLOOD GLUCOSE SYSTEM PAK) KIT, Use as directed to monitor FSBS 1x daily. Dx: E11.9. Please dispense as Accu-Chek Aviva, Disp: 1 each, Rfl: 1 .  Cholecalciferol (VITAMIN D3) 2000 UNITS TABS, Take 2,000 Units by mouth 2 (two) times daily., Disp: , Rfl:  .  Docusate Calcium (STOOL SOFTENER PO), Take 1 capsule by mouth as needed (constipation). , Disp: , Rfl:  .  Echinacea 380 MG CAPS, Take 1 capsule by mouth as needed (cold/flue)., Disp: , Rfl:  .  fluocinonide cream (LIDEX) 9.45 %, Apply 1 application topically 2 (two) times daily as needed (poison ivy/bites/hives)., Disp: 30 g, Rfl: 11 .  Ginkgo Biloba 120 MG CAPS, Take 1 capsule by mouth daily. , Disp: , Rfl:  .  glipiZIDE (GLUCOTROL) 10 MG tablet, TAKE 1 TABLET BY MOUTH TWICE DAILY BEFORE A MEAL, Disp: 180  tablet, Rfl: 0 .  glipiZIDE (GLUCOTROL) 10 MG tablet, TAKE 1 TABLET BY MOUTH TWICE DAILY BEFORE A MEAL, Disp: 180 tablet, Rfl: 0 .  Glucose Blood (BLOOD GLUCOSE TEST STRIPS) STRP, Use as directed to monitor FSBS 1x daily. Dx: E11.9. Please dispense as Accu-Chek Aviva, Disp: 100 each, Rfl: 1 .  KRILL OIL PO, Take 1 capsule by mouth 2 (two) times daily., Disp: , Rfl:  .  Lancets MISC, Use as directed to monitor FSBS 1x daily. Dx: E11.9. Please dispense as Accu-Chek Aviva, Disp: 100 each, Rfl: 1 .  Melatonin 3 MG TABS, Take 3 mg by mouth at bedtime as needed and may repeat dose one time if needed (sleep). , Disp: , Rfl:  .  metFORMIN (GLUCOPHAGE) 1000 MG tablet, TAKE 1 TABLET BY MOUTH TWICE DAILY WITH A MEAL, Disp: 180 tablet, Rfl: 0 .  metoprolol succinate (TOPROL-XL) 25 MG 24 hr tablet, Take 1 tablet (25 mg total) by mouth daily. (Patient taking differently: Take 12.5 mg by mouth daily. ), Disp: 90 tablet, Rfl: 2 .  mometasone-formoterol (DULERA) 100-5 MCG/ACT AERO, Inhale 2 puffs into the lungs 2 (two) times daily., Disp: 1 Inhaler, Rfl: 6 .  Multiple Vitamin (MULTIVITAMIN WITH MINERALS) TABS tablet, Take 1 tablet by mouth daily., Disp: , Rfl:  .  NONFORMULARY OR COMPOUNDED ITEM, Apply 1 application topically 2 (two) times daily as needed. Reported  on 06/10/2015, Disp: , Rfl:  .  rosuvastatin (CRESTOR) 5 MG tablet, TAKE 1 TABLET(5 MG) BY MOUTH DAILY, Disp: 90 tablet, Rfl: 0 .  simethicone (MYLICON) 80 MG chewable tablet, Chew 80 mg by mouth as needed for flatulence. , Disp: , Rfl:  .  tiZANidine (ZANAFLEX) 4 MG tablet, TAKE 1 TABLET BY MOUTH EVERY NIGHT AT BEDTIME AS NEEDED FOR MUSCLE SPASMS (Patient taking differently: Take 2 mg by mouth at bedtime. ), Disp: 90 tablet, Rfl: 0 .  torsemide (DEMADEX) 20 MG tablet, Take 1 tablet (20 mg total) by mouth daily. (Patient taking differently: Take 10 mg by mouth daily. ), Disp: 90 tablet, Rfl: 3 .  triamcinolone cream (KENALOG) 0.1 %, Apply 1 application  topically 2 (two) times daily., Disp: 30 g, Rfl: 0 .  TRULICITY 1.63 WG/6.6ZL SOPN, INJECT 1 PEN INTO THE SKIN ONCE EVERY WEEK, Disp: 2 mL, Rfl: 2  Past Medical History: Past Medical History:  Diagnosis Date  . Asthma    spirometry (2011)- mild ventilary defect  . Bone spur    "heels, knees, left shoulder" (06/22/2017)  . Breast cancer, left breast ([place]) dx'd 03/2011  . Chronic bronchitis ([place])   . COPD (chronic obstructive pulmonary disease) ([place])   . Fatty liver 2013   enlarged  . GERD (gastroesophageal reflux disease)   . HSV (herpes simplex virus) infection   . Hyperlipidemia   . Hypertension   . Lung nodule seen on imaging study 2013  . Lymphedema    Left arm  . 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 Osteoarthritis    "all over" (06/22/2017)  . Sleep apnea    Stop Bang score of 5. Pt has had sleep study, but was shown to be negative for sleep apnea.  . Type II diabetes mellitus ([place])   . Ventral hernia, unspecified, without mention of obstruction or gangrene     Tobacco Use: Social History   Tobacco Use  Smoking Status Never Smoker  Smokeless Tobacco Never Used    Labs: Recent Review Flowsheet Data    Labs for ITP Cardiac and Pulmonary Rehab Latest Ref Rng & Units 05/31/2016 09/20/2016 03/14/2017 07/08/2017 11/09/2017   Cholestrol <200 mg/dL 129 146 141 139 128   LDLCALC mg/dL (calc) 49 59 68 65 56   LDLDIRECT mg/dL - - - - -   HDL >50 mg/dL 46(L) 44(L) 47(L) 50(L) 45(L)   Trlycerides <150 mg/dL 170(H) 214(H) 181(H) 159(H) 197(H)   Hemoglobin A1c <5.7 % of total Hgb 7.0(H) 7.2(H) 6.8(H) 6.5(H) 6.9(H)      Capillary Blood Glucose: Lab Results  Component Value Date   GLUCAP 83 06/23/2017   GLUCAP 117 (H) 06/22/2017   GLUCAP 81 06/22/2017   GLUCAP 97 06/22/2017   GLUCAP 185 (H) 11/06/2013     Pulmonary Assessment Scores: Pulmonary Assessment Scores    Row Name 03/15/18 1437         ADL UCSD   ADL Phase  Entry     SOB Score total  69     Rest  0      Walk  11     Stairs  5     Bath  2     Dress  2     Shop  2       CAT Score   CAT Score  18       mMRC Score   mMRC Score  3  Pulmonary Function Assessment:   Exercise Target Goals: Exercise Program Goal: Individual exercise prescription set using results from initial 6 min walk test and THRR while considering  patient's activity barriers and safety.   Exercise Prescription Goal: Initial exercise prescription builds to 30-45 minutes a day of aerobic activity, 2-3 days per week.  Home exercise guidelines will be given to patient during program as part of exercise prescription that the participant will acknowledge.  Activity Barriers & Risk Stratification: Activity Barriers & Cardiac Risk Stratification - 03/15/18 1351      Activity Barriers & Cardiac Risk Stratification   Activity Barriers  Arthritis;Back Problems;Shortness of Breath;Deconditioning;Other (comment)    Comments  spurs in both heels and knees    Cardiac Risk Stratification  High       6 Minute Walk: 6 Minute Walk    Row Name 03/15/18 1350         6 Minute Walk   Phase  Initial     Distance  900 feet     Walk Time  6 minutes     # of Rest Breaks  2     MPH  1.7     METS  2.31     RPE  14     Perceived Dyspnea   14     VO2 Peak  5.49     Symptoms  Yes (comment)     Comments  SOB     Resting HR  93 bpm     Resting BP  122/68     Resting Oxygen Saturation   98 %     Exercise Oxygen Saturation  during 6 min walk  95 %     Max Ex. HR  110 bpm     Max Ex. BP  144/64     2 Minute Post BP  126/62        Oxygen Initial Assessment: Oxygen Initial Assessment - 03/15/18 1436      Home Oxygen   Home Oxygen Device  None    Sleep Oxygen Prescription  None    Home Exercise Oxygen Prescription  None    Home at Rest Exercise Oxygen Prescription  None      Initial 6 min Walk   Oxygen Used  None      Program Oxygen Prescription   Program Oxygen Prescription  None       Oxygen  Re-Evaluation:   Oxygen Discharge (Final Oxygen Re-Evaluation):   Initial Exercise Prescription: Initial Exercise Prescription - 03/15/18 1400      Date of Initial Exercise RX and Referring Provider   Date  03/15/18    Referring Provider  Caryl Comes    Expected Discharge Date  06/15/18      NuStep   Level  1    SPM  60    Minutes  17    METs  1.7      Arm Ergometer   Level  1    Watts  11    RPM  50    Minutes  17    METs  1.8      Prescription Details   Frequency (times per week)  2    Duration  Progress to 30 minutes of continuous aerobic without signs/symptoms of physical distress      Intensity   THRR 40-80% of Max Heartrate  [phone]    Ratings of Perceived Exertion  11-13    Perceived Dyspnea  0-4      Progression  Progression  Continue to progress workloads to maintain intensity without signs/symptoms of physical distress.      Resistance Training   Training Prescription  Yes    Weight  1    Reps  10-15       Perform Capillary Blood Glucose checks as needed.  Exercise Prescription Changes:  Exercise Prescription Changes    Row Name 03/15/18 1400             Home Exercise Plan   Plans to continue exercise at  Home (comment)       Frequency  Add 3 additional days to program exercise sessions.       Initial Home Exercises Provided  03/15/18          Exercise Comments:   Exercise Goals and Review:  Exercise Goals    Row Name 03/15/18 1351             Exercise Goals   Increase Physical Activity  Yes       Intervention  Provide advice, education, support and counseling about physical activity/exercise needs.;Develop an individualized exercise prescription for aerobic and resistive training based on initial evaluation findings, risk stratification, comorbidities and participant's personal goals.       Expected Outcomes  Short Term: Attend rehab on a regular basis to increase amount of physical activity.       Increase Strength and Stamina   Yes       Intervention  Provide advice, education, support and counseling about physical activity/exercise needs.;Develop an individualized exercise prescription for aerobic and resistive training based on initial evaluation findings, risk stratification, comorbidities and participant's personal goals.       Expected Outcomes  Short Term: Increase workloads from initial exercise prescription for resistance, speed, and METs.       Able to understand and use rate of perceived exertion (RPE) scale  Yes       Intervention  Provide education and explanation on how to use RPE scale       Expected Outcomes  Short Term: Able to use RPE daily in rehab to express subjective intensity level;Long Term:  Able to use RPE to guide intensity level when exercising independently       Able to understand and use Dyspnea scale  Yes       Intervention  Provide education and explanation on how to use Dyspnea scale       Expected Outcomes  Short Term: Able to use Dyspnea scale daily in rehab to express subjective sense of shortness of breath during exertion;Long Term: Able to use Dyspnea scale to guide intensity level when exercising independently       Knowledge and understanding of Target Heart Rate Range (THRR)  Yes       Intervention  Provide education and explanation of THRR including how the numbers were predicted and where they are located for reference       Expected Outcomes  Short Term: Able to state/look up THRR       Able to check pulse independently  Yes       Intervention  Provide education and demonstration on how to check pulse in carotid and radial arteries.;Review the importance of being able to check your own pulse for safety during independent exercise       Expected Outcomes  Short Term: Able to explain why pulse checking is important during independent exercise;Long Term: Able to check pulse independently and accurately       Understanding of  Exercise Prescription  Yes       Intervention  Provide  education, explanation, and written materials on patient's individual exercise prescription       Expected Outcomes  Short Term: Able to explain program exercise prescription;Long Term: Able to explain home exercise prescription to exercise independently          Exercise Goals Re-Evaluation :   Discharge Exercise Prescription (Final Exercise Prescription Changes): Exercise Prescription Changes - 03/15/18 1400      Home Exercise Plan   Plans to continue exercise at  Home (comment)    Frequency  Add 3 additional days to program exercise sessions.    Initial Home Exercises Provided  03/15/18       Nutrition:  Target Goals: Understanding of nutrition guidelines, daily intake of sodium <1528m, cholesterol <2078m calories 30% from fat and 7% or less from saturated fats, daily to have 5 or more servings of fruits and vegetables.  Biometrics: Pre Biometrics - 03/15/18 1352      Pre Biometrics   Height  _0  (1.651 m)    Weight  223 lb 8.7 oz (101.4 kg)    Waist Circumference  44.5 inches    Hip Circumference  58.5 inches    Waist to Hip Ratio  0.76 %    BMI (Calculated)  37.2    Triceps Skinfold  29 mm    % Body Fat  47.9 %    Grip Strength  16.7 kg    Flexibility  0 in    Single Leg Stand  7.38 seconds        Nutrition Therapy Plan and Nutrition Goals: Nutrition Therapy & Goals - 03/15/18 1439      Personal Nutrition Goals   Personal Goal #2  Patient eats heart healthy diet.     Additional Goals?  No       Nutrition Assessments: Nutrition Assessments - 03/15/18 1439      MEDFICTS Scores   Pre Score  53       Nutrition Goals Re-Evaluation:   Nutrition Goals Discharge (Final Nutrition Goals Re-Evaluation):   Psychosocial: Target Goals: Acknowledge presence or absence of significant depression and/or stress, maximize coping skills, provide positive support system. Participant is able to verbalize types and ability to use techniques and skills needed for  reducing stress and depression.  Initial Review & Psychosocial Screening: Initial Psych Review & Screening - 03/15/18 1438      Initial Review   Current issues with  None Identified      Family Dynamics   Good Support System?  Yes      Barriers   Psychosocial barriers to participate in program  There are no identifiable barriers or psychosocial needs.      Screening Interventions   Interventions  Encouraged to exercise    Expected Outcomes  Short Term goal: Identification and review with participant of any Quality of Life or Depression concerns found by scoring the questionnaire.;Long Term goal: The participant improves quality of Life and PHQ9 Scores as seen by post scores and/or verbalization of changes       Quality of Life Scores: Quality of Life - 03/15/18 1353      Quality of Life   Select  Quality of Life      Quality of Life Scores   Health/Function Pre  22.37 %    Socioeconomic Pre  23.33 %    Psych/Spiritual Pre  22.79 %    Family Pre  22.9 %  GLOBAL Pre  22.71 %      Scores of 19 and below usually indicate a poorer quality of life in these areas.  A difference of  2-3 points is a clinically meaningful difference.  A difference of 2-3 points in the total score of the Quality of Life Index has been associated with significant improvement in overall quality of life, self-image, physical symptoms, and general health in studies assessing change in quality of life.   PHQ-9: Recent Review Flowsheet Data    Depression screen [facility] 2/9 03/15/2018 11/11/2017 07/07/2017 03/15/2017 09/28/2016   Decreased Interest 0 0 0 0 0   Down, Depressed, Hopeless 0 0 0 0 0   PHQ - 2 Score 0 0 0 0 0   Altered sleeping 0 - - 1 0   Tired, decreased energy 3 - - 1 0   Change in appetite - - - 0 0   Feeling bad or failure about yourself  0 - - 0 0   Trouble concentrating 0 - - 0 0   Moving slowly or fidgety/restless 0 - - 0 0   Suicidal thoughts 0 - - 0 0   PHQ-9 Score 3 - - 2 0    Difficult doing work/chores Somewhat difficult - - Not difficult at all Not difficult at all     Interpretation of Total Score  Total Score Depression Severity:  1-4 = Minimal depression, 5-9 = Mild depression, 10-14 = Moderate depression, 15-19 = Moderately severe depression, 20-27 = Severe depression   Psychosocial Evaluation and Intervention: Psychosocial Evaluation - 03/15/18 1439      Psychosocial Evaluation & Interventions   Interventions  Encouraged to exercise with the program and follow exercise prescription    Continue Psychosocial Services   No Follow up required       Psychosocial Re-Evaluation:   Psychosocial Discharge (Final Psychosocial Re-Evaluation):    Education: Education Goals: Education classes will be provided on a weekly basis, covering required topics. Participant will state understanding/return demonstration of topics presented.  Learning Barriers/Preferences: Learning Barriers/Preferences - 03/15/18 1440      Learning Barriers/Preferences   Learning Barriers  None    Learning Preferences  Audio;Computer/Internet;Group Instruction;Individual Instruction;Pictoral;Skilled Demonstration;Verbal Instruction;Video;Written Material       Education Topics: How Lungs Work and Diseases: - Discuss the anatomy of the lungs and diseases that can affect the lungs, such as COPD.   Exercise: -Discuss the importance of exercise, FITT principles of exercise, normal and abnormal responses to exercise, and how to exercise safely.   Environmental Irritants: -Discuss types of environmental irritants and how to limit exposure to environmental irritants.   Meds/Inhalers and oxygen: - Discuss respiratory medications, definition of an inhaler and oxygen, and the proper way to use an inhaler and oxygen.   Energy Saving Techniques: - Discuss methods to conserve energy and decrease shortness of breath when performing activities of daily living.    Bronchial Hygiene  / Breathing Techniques: - Discuss breathing mechanics, pursed-lip breathing technique,  proper posture, effective ways to clear airways, and other functional breathing techniques   Cleaning Equipment: - Provides group verbal and written instruction about the health risks of elevated stress, cause of high stress, and healthy ways to reduce stress.   Nutrition I: Fats: - Discuss the types of cholesterol, what cholesterol does to the body, and how cholesterol levels can be controlled.   Nutrition II: Labels: -Discuss the different components of food labels and how to read food labels.   Respiratory  Infections: - Discuss the signs and symptoms of respiratory infections, ways to prevent respiratory infections, and the importance of seeking medical treatment when having a respiratory infection.   Stress I: Signs and Symptoms: - Discuss the causes of stress, how stress may lead to anxiety and depression, and ways to limit stress.   Stress II: Relaxation: -Discuss relaxation techniques to limit stress.   Oxygen for Home/Travel: - Discuss how to prepare for travel when on oxygen and proper ways to transport and store oxygen to ensure safety.   Knowledge Questionnaire Score: Knowledge Questionnaire Score - 03/15/18 1440      Knowledge Questionnaire Score   Pre Score  23/24       Core Components/Risk Factors/Patient Goals at Admission: Personal Goals and Risk Factors at Admission - 03/15/18 1440      Core Components/Risk Factors/Patient Goals on Admission    Weight Management  Weight Maintenance    Diabetes  Yes    Intervention  Provide education about signs/symptoms and action to take for hypo/hyperglycemia.;Provide education about proper nutrition, including hydration, and aerobic/resistive exercise prescription along with prescribed medications to achieve blood glucose in normal ranges: Fasting glucose 65-99 mg/dL    Expected Outcomes  Short Term: Participant verbalizes  understanding of the signs/symptoms and immediate care of hyper/hypoglycemia, proper foot care and importance of medication, aerobic/resistive exercise and nutrition plan for blood glucose control.;Long Term: Attainment of HbA1C < 7%.    Personal Goal Other  Yes    Personal Goal  Get more stamina, continue to gain strength and stamina    Intervention  Attend PR 2 x week and supplement with home exercise 3 x week.     Expected Outcomes  Reach personal goals of gaining stamina       Core Components/Risk Factors/Patient Goals Review:    Core Components/Risk Factors/Patient Goals at Discharge (Final Review):    ITP Comments: ITP Comments    Row Name 03/15/18 1435           ITP Comments  Mrs. Hornaday is a pleasant 77 year old female that has CHF. 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           Comments: Patient arrived for 1st visit/orientation/education at 1230. Patient was referred to PR by Dr. Caryl Comes due to Chronic Diastolic CHF (G99.24). During orientation advised patient on arrival and appointment times what to wear, what to do before, during and after exercise. Reviewed attendance and class policy. Talked about inclement weather and class consultation policy. Pt is scheduled to return Pulmonary Rehab on 03/21/18 at 1330. Pt was advised to come to class 15 minutes before class starts. Patient was also given instructions on meeting with the dietician and attending the Family Structure classes. Discussed RPE/Dpysnea scales. Discussed initial THR and how to find their radial and/or carotid pulse. Discussed the initial exercise prescription and how this effects their progress. Pt is eager to get started. Patient participated in warm up stretches followed by light weights and resistance bands. Patient was able to complete 6 minute walk test. Patient did not c/o pain. She had to rest for 1 minute due to extreme SOB during test. SOB subsided to no SOB at 2 minute rest.  Patient was measured for the equipment. Discussed equipment safety with patient. Took patient pre-anthropometric measurements. Patient finished visit at 1430.

## 2018-03-20 NOTE — Progress Notes (Signed)
Pulmonary Individual Treatment Plan  Patient Details  Name: Julie Clay MRN: [id number] Date of Birth: [birth year] Referring Provider:     PULMONARY REHAB OTHER RESP ORIENTATION from 03/15/2018 in [facility]  Referring Provider  Caryl Comes      Initial Encounter Date:    PULMONARY REHAB OTHER RESP ORIENTATION from 03/15/2018 in [facility]  Date  03/15/18      Visit Diagnosis: Chronic diastolic CHF (congestive heart failure) ([place])  Patient's Home Medications on Admission:   Current Outpatient Medications:  .  acetaminophen (TYLENOL) 500 MG tablet, Take 500 mg by mouth as needed for moderate pain. Takes 2 when needed, Disp: , Rfl:  .  albuterol (PROVENTIL HFA;VENTOLIN HFA) 108 (90 Base) MCG/ACT inhaler, Inhale 2 puffs into the lungs every 4 (four) hours as needed., Disp: 6.7 g, Rfl: 6 .  Aloe Vera Leaf POWD, Take 800 mg by mouth 2 (two) times daily. , Disp: , Rfl:  .  Ascorbic Acid (VITAMIN C) 1000 MG tablet, Take 1,000 mg by mouth 2 (two) times daily., Disp: , Rfl:  .  b complex vitamins tablet, Take 1 tablet by mouth 2 (two) times daily. , Disp: , Rfl:  .  Blood Glucose Monitoring Suppl (BLOOD GLUCOSE SYSTEM PAK) KIT, Use as directed to monitor FSBS 1x daily. Dx: E11.9. Please dispense as Accu-Chek Aviva, Disp: 1 each, Rfl: 1 .  Cholecalciferol (VITAMIN D3) 2000 UNITS TABS, Take 2,000 Units by mouth 2 (two) times daily., Disp: , Rfl:  .  Docusate Calcium (STOOL SOFTENER PO), Take 1 capsule by mouth as needed (constipation). , Disp: , Rfl:  .  Echinacea 380 MG CAPS, Take 1 capsule by mouth as needed (cold/flue)., Disp: , Rfl:  .  fluocinonide cream (LIDEX) 9.45 %, Apply 1 application topically 2 (two) times daily as needed (poison ivy/bites/hives)., Disp: 30 g, Rfl: 11 .  Ginkgo Biloba 120 MG CAPS, Take 1 capsule by mouth daily. , Disp: , Rfl:  .  glipiZIDE (GLUCOTROL) 10 MG tablet, TAKE 1 TABLET BY MOUTH TWICE DAILY BEFORE A MEAL, Disp: 180  tablet, Rfl: 0 .  glipiZIDE (GLUCOTROL) 10 MG tablet, TAKE 1 TABLET BY MOUTH TWICE DAILY BEFORE A MEAL, Disp: 180 tablet, Rfl: 0 .  Glucose Blood (BLOOD GLUCOSE TEST STRIPS) STRP, Use as directed to monitor FSBS 1x daily. Dx: E11.9. Please dispense as Accu-Chek Aviva, Disp: 100 each, Rfl: 1 .  KRILL OIL PO, Take 1 capsule by mouth 2 (two) times daily., Disp: , Rfl:  .  Lancets MISC, Use as directed to monitor FSBS 1x daily. Dx: E11.9. Please dispense as Accu-Chek Aviva, Disp: 100 each, Rfl: 1 .  Melatonin 3 MG TABS, Take 3 mg by mouth at bedtime as needed and may repeat dose one time if needed (sleep). , Disp: , Rfl:  .  metFORMIN (GLUCOPHAGE) 1000 MG tablet, TAKE 1 TABLET BY MOUTH TWICE DAILY WITH A MEAL, Disp: 180 tablet, Rfl: 0 .  metoprolol succinate (TOPROL-XL) 25 MG 24 hr tablet, Take 1 tablet (25 mg total) by mouth daily. (Patient taking differently: Take 12.5 mg by mouth daily. ), Disp: 90 tablet, Rfl: 2 .  mometasone-formoterol (DULERA) 100-5 MCG/ACT AERO, Inhale 2 puffs into the lungs 2 (two) times daily., Disp: 1 Inhaler, Rfl: 6 .  Multiple Vitamin (MULTIVITAMIN WITH MINERALS) TABS tablet, Take 1 tablet by mouth daily., Disp: , Rfl:  .  NONFORMULARY OR COMPOUNDED ITEM, Apply 1 application topically 2 (two) times daily as needed. Reported  on 06/10/2015, Disp: , Rfl:  .  rosuvastatin (CRESTOR) 5 MG tablet, TAKE 1 TABLET(5 MG) BY MOUTH DAILY, Disp: 90 tablet, Rfl: 0 .  simethicone (MYLICON) 80 MG chewable tablet, Chew 80 mg by mouth as needed for flatulence. , Disp: , Rfl:  .  tiZANidine (ZANAFLEX) 4 MG tablet, TAKE 1 TABLET BY MOUTH EVERY NIGHT AT BEDTIME AS NEEDED FOR MUSCLE SPASMS (Patient taking differently: Take 2 mg by mouth at bedtime. ), Disp: 90 tablet, Rfl: 0 .  torsemide (DEMADEX) 20 MG tablet, Take 1 tablet (20 mg total) by mouth daily. (Patient taking differently: Take 10 mg by mouth daily. ), Disp: 90 tablet, Rfl: 3 .  triamcinolone cream (KENALOG) 0.1 %, Apply 1 application  topically 2 (two) times daily., Disp: 30 g, Rfl: 0 .  TRULICITY 1.63 WG/6.6ZL SOPN, INJECT 1 PEN INTO THE SKIN ONCE EVERY WEEK, Disp: 2 mL, Rfl: 2  Past Medical History: Past Medical History:  Diagnosis Date  . Asthma    spirometry (2011)- mild ventilary defect  . Bone spur    "heels, knees, left shoulder" (06/22/2017)  . Breast cancer, left breast ([place]) dx'd 03/2011  . Chronic bronchitis ([place])   . COPD (chronic obstructive pulmonary disease) ([place])   . Fatty liver 2013   enlarged  . GERD (gastroesophageal reflux disease)   . HSV (herpes simplex virus) infection   . Hyperlipidemia   . Hypertension   . Lung nodule seen on imaging study 2013  . Lymphedema    Left arm  . 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 Osteoarthritis    "all over" (06/22/2017)  . Sleep apnea    Stop Bang score of 5. Pt has had sleep study, but was shown to be negative for sleep apnea.  . Type II diabetes mellitus ([place])   . Ventral hernia, unspecified, without mention of obstruction or gangrene     Tobacco Use: Social History   Tobacco Use  Smoking Status Never Smoker  Smokeless Tobacco Never Used    Labs: Recent Review Flowsheet Data    Labs for ITP Cardiac and Pulmonary Rehab Latest Ref Rng & Units 05/31/2016 09/20/2016 03/14/2017 07/08/2017 11/09/2017   Cholestrol <200 mg/dL 129 146 141 139 128   LDLCALC mg/dL (calc) 49 59 68 65 56   LDLDIRECT mg/dL - - - - -   HDL >50 mg/dL 46(L) 44(L) 47(L) 50(L) 45(L)   Trlycerides <150 mg/dL 170(H) 214(H) 181(H) 159(H) 197(H)   Hemoglobin A1c <5.7 % of total Hgb 7.0(H) 7.2(H) 6.8(H) 6.5(H) 6.9(H)      Capillary Blood Glucose: Lab Results  Component Value Date   GLUCAP 83 06/23/2017   GLUCAP 117 (H) 06/22/2017   GLUCAP 81 06/22/2017   GLUCAP 97 06/22/2017   GLUCAP 185 (H) 11/06/2013     Pulmonary Assessment Scores: Pulmonary Assessment Scores    Row Name 03/15/18 1437         ADL UCSD   ADL Phase  Entry     SOB Score total  69     Rest  0      Walk  11     Stairs  5     Bath  2     Dress  2     Shop  2       CAT Score   CAT Score  18       mMRC Score   mMRC Score  3  Pulmonary Function Assessment:   Exercise Target Goals: Exercise Program Goal: Individual exercise prescription set using results from initial 6 min walk test and THRR while considering  patient's activity barriers and safety.   Exercise Prescription Goal: Initial exercise prescription builds to 30-45 minutes a day of aerobic activity, 2-3 days per week.  Home exercise guidelines will be given to patient during program as part of exercise prescription that the participant will acknowledge.  Activity Barriers & Risk Stratification: Activity Barriers & Cardiac Risk Stratification - 03/15/18 1351      Activity Barriers & Cardiac Risk Stratification   Activity Barriers  Arthritis;Back Problems;Shortness of Breath;Deconditioning;Other (comment)    Comments  spurs in both heels and knees    Cardiac Risk Stratification  High       6 Minute Walk: 6 Minute Walk    Row Name 03/15/18 1350         6 Minute Walk   Phase  Initial     Distance  900 feet     Walk Time  6 minutes     # of Rest Breaks  2     MPH  1.7     METS  2.31     RPE  14     Perceived Dyspnea   14     VO2 Peak  5.49     Symptoms  Yes (comment)     Comments  SOB     Resting HR  93 bpm     Resting BP  122/68     Resting Oxygen Saturation   98 %     Exercise Oxygen Saturation  during 6 min walk  95 %     Max Ex. HR  110 bpm     Max Ex. BP  144/64     2 Minute Post BP  126/62        Oxygen Initial Assessment: Oxygen Initial Assessment - 03/15/18 1436      Home Oxygen   Home Oxygen Device  None    Sleep Oxygen Prescription  None    Home Exercise Oxygen Prescription  None    Home at Rest Exercise Oxygen Prescription  None      Initial 6 min Walk   Oxygen Used  None      Program Oxygen Prescription   Program Oxygen Prescription  None       Oxygen  Re-Evaluation:   Oxygen Discharge (Final Oxygen Re-Evaluation):   Initial Exercise Prescription: Initial Exercise Prescription - 03/15/18 1400      Date of Initial Exercise RX and Referring Provider   Date  03/15/18    Referring Provider  Caryl Comes    Expected Discharge Date  06/15/18      NuStep   Level  1    SPM  60    Minutes  17    METs  1.7      Arm Ergometer   Level  1    Watts  11    RPM  50    Minutes  17    METs  1.8      Prescription Details   Frequency (times per week)  2    Duration  Progress to 30 minutes of continuous aerobic without signs/symptoms of physical distress      Intensity   THRR 40-80% of Max Heartrate  [phone]    Ratings of Perceived Exertion  11-13    Perceived Dyspnea  0-4      Progression  Progression  Continue to progress workloads to maintain intensity without signs/symptoms of physical distress.      Resistance Training   Training Prescription  Yes    Weight  1    Reps  10-15       Perform Capillary Blood Glucose checks as needed.  Exercise Prescription Changes:  Exercise Prescription Changes    Row Name 03/15/18 1400             Home Exercise Plan   Plans to continue exercise at  Home (comment)       Frequency  Add 3 additional days to program exercise sessions.       Initial Home Exercises Provided  03/15/18          Exercise Comments:   Exercise Goals and Review:  Exercise Goals    Row Name 03/15/18 1351             Exercise Goals   Increase Physical Activity  Yes       Intervention  Provide advice, education, support and counseling about physical activity/exercise needs.;Develop an individualized exercise prescription for aerobic and resistive training based on initial evaluation findings, risk stratification, comorbidities and participant's personal goals.       Expected Outcomes  Short Term: Attend rehab on a regular basis to increase amount of physical activity.       Increase Strength and Stamina   Yes       Intervention  Provide advice, education, support and counseling about physical activity/exercise needs.;Develop an individualized exercise prescription for aerobic and resistive training based on initial evaluation findings, risk stratification, comorbidities and participant's personal goals.       Expected Outcomes  Short Term: Increase workloads from initial exercise prescription for resistance, speed, and METs.       Able to understand and use rate of perceived exertion (RPE) scale  Yes       Intervention  Provide education and explanation on how to use RPE scale       Expected Outcomes  Short Term: Able to use RPE daily in rehab to express subjective intensity level;Long Term:  Able to use RPE to guide intensity level when exercising independently       Able to understand and use Dyspnea scale  Yes       Intervention  Provide education and explanation on how to use Dyspnea scale       Expected Outcomes  Short Term: Able to use Dyspnea scale daily in rehab to express subjective sense of shortness of breath during exertion;Long Term: Able to use Dyspnea scale to guide intensity level when exercising independently       Knowledge and understanding of Target Heart Rate Range (THRR)  Yes       Intervention  Provide education and explanation of THRR including how the numbers were predicted and where they are located for reference       Expected Outcomes  Short Term: Able to state/look up THRR       Able to check pulse independently  Yes       Intervention  Provide education and demonstration on how to check pulse in carotid and radial arteries.;Review the importance of being able to check your own pulse for safety during independent exercise       Expected Outcomes  Short Term: Able to explain why pulse checking is important during independent exercise;Long Term: Able to check pulse independently and accurately       Understanding of  Exercise Prescription  Yes       Intervention  Provide  education, explanation, and written materials on patient's individual exercise prescription       Expected Outcomes  Short Term: Able to explain program exercise prescription;Long Term: Able to explain home exercise prescription to exercise independently          Exercise Goals Re-Evaluation :   Discharge Exercise Prescription (Final Exercise Prescription Changes): Exercise Prescription Changes - 03/15/18 1400      Home Exercise Plan   Plans to continue exercise at  Home (comment)    Frequency  Add 3 additional days to program exercise sessions.    Initial Home Exercises Provided  03/15/18       Nutrition:  Target Goals: Understanding of nutrition guidelines, daily intake of sodium '1500mg'$ , cholesterol '200mg'$ , calories 30% from fat and 7% or less from saturated fats, daily to have 5 or more servings of fruits and vegetables.  Biometrics: Pre Biometrics - 03/15/18 1352      Pre Biometrics   Height  '5\' 5"'$  (1.651 m)    Weight  101.4 kg    Waist Circumference  44.5 inches    Hip Circumference  58.5 inches    Waist to Hip Ratio  0.76 %    BMI (Calculated)  37.2    Triceps Skinfold  29 mm    % Body Fat  47.9 %    Grip Strength  16.7 kg    Flexibility  0 in    Single Leg Stand  7.38 seconds        Nutrition Therapy Plan and Nutrition Goals: Nutrition Therapy & Goals - 03/15/18 1439      Personal Nutrition Goals   Personal Goal #2  Patient eats heart healthy diet.     Additional Goals?  No       Nutrition Assessments: Nutrition Assessments - 03/15/18 1439      MEDFICTS Scores   Pre Score  53       Nutrition Goals Re-Evaluation:   Nutrition Goals Discharge (Final Nutrition Goals Re-Evaluation):   Psychosocial: Target Goals: Acknowledge presence or absence of significant depression and/or stress, maximize coping skills, provide positive support system. Participant is able to verbalize types and ability to use techniques and skills needed for reducing stress and  depression.  Initial Review & Psychosocial Screening: Initial Psych Review & Screening - 03/15/18 1438      Initial Review   Current issues with  None Identified      Family Dynamics   Good Support System?  Yes      Barriers   Psychosocial barriers to participate in program  There are no identifiable barriers or psychosocial needs.      Screening Interventions   Interventions  Encouraged to exercise    Expected Outcomes  Short Term goal: Identification and review with participant of any Quality of Life or Depression concerns found by scoring the questionnaire.;Long Term goal: The participant improves quality of Life and PHQ9 Scores as seen by post scores and/or verbalization of changes       Quality of Life Scores: Quality of Life - 03/15/18 1353      Quality of Life   Select  Quality of Life      Quality of Life Scores   Health/Function Pre  22.37 %    Socioeconomic Pre  23.33 %    Psych/Spiritual Pre  22.79 %    Family Pre  22.9 %    GLOBAL Pre  22.71 %      Scores of 19 and below usually indicate a poorer quality of life in these areas.  A difference of  2-3 points is a clinically meaningful difference.  A difference of 2-3 points in the total score of the Quality of Life Index has been associated with significant improvement in overall quality of life, self-image, physical symptoms, and general health in studies assessing change in quality of life.   PHQ-9: Recent Review Flowsheet Data    Depression screen [facility] 2/9 03/15/2018 11/11/2017 07/07/2017 03/15/2017 09/28/2016   Decreased Interest 0 0 0 0 0   Down, Depressed, Hopeless 0 0 0 0 0   PHQ - 2 Score 0 0 0 0 0   Altered sleeping 0 - - 1 0   Tired, decreased energy 3 - - 1 0   Change in appetite - - - 0 0   Feeling bad or failure about yourself  0 - - 0 0   Trouble concentrating 0 - - 0 0   Moving slowly or fidgety/restless 0 - - 0 0   Suicidal thoughts 0 - - 0 0   PHQ-9 Score 3 - - 2 0   Difficult doing  work/chores Somewhat difficult - - Not difficult at all Not difficult at all     Interpretation of Total Score  Total Score Depression Severity:  1-4 = Minimal depression, 5-9 = Mild depression, 10-14 = Moderate depression, 15-19 = Moderately severe depression, 20-27 = Severe depression   Psychosocial Evaluation and Intervention: Psychosocial Evaluation - 03/15/18 1439      Psychosocial Evaluation & Interventions   Interventions  Encouraged to exercise with the program and follow exercise prescription    Continue Psychosocial Services   No Follow up required       Psychosocial Re-Evaluation:   Psychosocial Discharge (Final Psychosocial Re-Evaluation):    Education: Education Goals: Education classes will be provided on a weekly basis, covering required topics. Participant will state understanding/return demonstration of topics presented.  Learning Barriers/Preferences: Learning Barriers/Preferences - 03/15/18 1440      Learning Barriers/Preferences   Learning Barriers  None    Learning Preferences  Audio;Computer/Internet;Group Instruction;Individual Instruction;Pictoral;Skilled Demonstration;Verbal Instruction;Video;Written Material       Education Topics: How Lungs Work and Diseases: - Discuss the anatomy of the lungs and diseases that can affect the lungs, such as COPD.   Exercise: -Discuss the importance of exercise, FITT principles of exercise, normal and abnormal responses to exercise, and how to exercise safely.   Environmental Irritants: -Discuss types of environmental irritants and how to limit exposure to environmental irritants.   Meds/Inhalers and oxygen: - Discuss respiratory medications, definition of an inhaler and oxygen, and the proper way to use an inhaler and oxygen.   Energy Saving Techniques: - Discuss methods to conserve energy and decrease shortness of breath when performing activities of daily living.    Bronchial Hygiene / Breathing  Techniques: - Discuss breathing mechanics, pursed-lip breathing technique,  proper posture, effective ways to clear airways, and other functional breathing techniques   Cleaning Equipment: - Provides group verbal and written instruction about the health risks of elevated stress, cause of high stress, and healthy ways to reduce stress.   Nutrition I: Fats: - Discuss the types of cholesterol, what cholesterol does to the body, and how cholesterol levels can be controlled.   Nutrition II: Labels: -Discuss the different components of food labels and how to read food labels.   Respiratory Infections: - Discuss  the signs and symptoms of respiratory infections, ways to prevent respiratory infections, and the importance of seeking medical treatment when having a respiratory infection.   Stress I: Signs and Symptoms: - Discuss the causes of stress, how stress may lead to anxiety and depression, and ways to limit stress.   Stress II: Relaxation: -Discuss relaxation techniques to limit stress.   Oxygen for Home/Travel: - Discuss how to prepare for travel when on oxygen and proper ways to transport and store oxygen to ensure safety.   Knowledge Questionnaire Score: Knowledge Questionnaire Score - 03/15/18 1440      Knowledge Questionnaire Score   Pre Score  23/24       Core Components/Risk Factors/Patient Goals at Admission: Personal Goals and Risk Factors at Admission - 03/15/18 1440      Core Components/Risk Factors/Patient Goals on Admission    Weight Management  Weight Maintenance    Diabetes  Yes    Intervention  Provide education about signs/symptoms and action to take for hypo/hyperglycemia.;Provide education about proper nutrition, including hydration, and aerobic/resistive exercise prescription along with prescribed medications to achieve blood glucose in normal ranges: Fasting glucose 65-99 mg/dL    Expected Outcomes  Short Term: Participant verbalizes understanding of  the signs/symptoms and immediate care of hyper/hypoglycemia, proper foot care and importance of medication, aerobic/resistive exercise and nutrition plan for blood glucose control.;Long Term: Attainment of HbA1C < 7%.    Personal Goal Other  Yes    Personal Goal  Get more stamina, continue to gain strength and stamina    Intervention  Attend PR 2 x week and supplement with home exercise 3 x week.     Expected Outcomes  Reach personal goals of gaining stamina       Core Components/Risk Factors/Patient Goals Review:    Core Components/Risk Factors/Patient Goals at Discharge (Final Review):    ITP Comments: ITP Comments    Row Name 03/15/18 1435 03/20/18 1504         ITP Comments  Mrs. Cashaw is a pleasant 77 year old female that has CHF. 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   Patient is new to program. She plans to start 03/21/18. Will continue to monitor for progress.          Comments: ITP REVIEW Patient is new to program. She plans to start 03/21/18. Will continue to monitor for progress.

## 2018-03-21 ENCOUNTER — Encounter (HOSPITAL_COMMUNITY)
Admission: RE | Admit: 2018-03-21 | Discharge: 2018-03-21 | Disposition: A | Discharge status: Home / Self Care | Payer: Medicare Other | Source: Ambulatory Visit | Attending: Internal Medicine | Admitting: Internal Medicine

## 2018-03-21 DIAGNOSIS — I5032 Chronic diastolic (congestive) heart failure: Secondary | ICD-10-CM

## 2018-03-21 NOTE — Progress Notes (Signed)
Daily Session Note  Patient Details  Name: JAQUISHA FRECH MRN: [id number] Date of Birth: 09-Mar-1941 Referring Provider:     PULMONARY REHAB OTHER RESP ORIENTATION from 03/15/2018 in [place]  Referring Provider  Caryl Comes      Encounter Date: 03/21/2018  Check In: Session Check In - 03/21/18 1330      Check-In   Supervising physician immediately available to respond to emergencies  See telemetry face sheet for immediately available MD    Location  [facility]    Staff Present  Russella Dar, MS, EP, [facility], Exercise Physiologist;Shaye Lagace Zachery Conch, Exercise Physiologist    Medication changes reported      No    Fall or balance concerns reported     No    Warm-up and Cool-down  Performed as group-led instruction    Resistance Training Performed  Yes    VAD Patient?  No    PAD/SET Patient?  No      Pain Assessment   Currently in Pain?  No/denies    Pain Score  0-No pain    Multiple Pain Sites  No       Capillary Blood Glucose: No results found for this or any previous visit (from the past 24 hour(s)).    Social History   Tobacco Use  Smoking Status Never Smoker  Smokeless Tobacco Never Used    Goals Met:  Proper associated with RPD/PD & O2 Sat Independence with exercise equipment Using PLB without cueing & demonstrates good technique Exercise tolerated well No report of cardiac concerns or symptoms Strength training completed today  Goals Unmet:  Not Applicable  Comments: Pt able to follow exercise prescription today without complaint.  Will continue to monitor for progression. Check out 2:30.   Dr. Sinda Du is Medical Director for [facility] Pulmonary Re[facility].

## 2018-03-23 ENCOUNTER — Encounter (HOSPITAL_COMMUNITY)
Admission: RE | Admit: 2018-03-23 | Discharge: 2018-03-23 | Disposition: A | Discharge status: Home / Self Care | Payer: Medicare Other | Source: Ambulatory Visit | Attending: Internal Medicine | Admitting: Internal Medicine

## 2018-03-23 DIAGNOSIS — I5032 Chronic diastolic (congestive) heart failure: Secondary | ICD-10-CM

## 2018-03-23 NOTE — Progress Notes (Signed)
Daily Session Note  Patient Details  Name: Julie Clay MRN: [id number] Date of Birth: [birth year] Referring Provider:     PULMONARY REHAB OTHER RESP ORIENTATION from 03/15/2018 in [place]  Referring Provider  Caryl Comes      Encounter Date: 03/23/2018  Check In: Session Check In - 03/23/18 1045      Check-In   Supervising physician immediately available to respond to emergencies  See telemetry face sheet for immediately available MD    Location  [facility]    Staff Present  Benay Pike, Exercise Physiologist;Jordis Repetto Wynetta Emery, RN, BSN    Medication changes reported      No    Fall or balance concerns reported     No    Warm-up and Cool-down  Performed as group-led instruction    Resistance Training Performed  Yes    VAD Patient?  No    PAD/SET Patient?  No      Pain Assessment   Currently in Pain?  No/denies    Pain Score  0-No pain    Multiple Pain Sites  No       Capillary Blood Glucose: No results found for this or any previous visit (from the past 24 hour(s)).    Social History   Tobacco Use  Smoking Status Never Smoker  Smokeless Tobacco Never Used    Goals Met:  Proper associated with RPD/PD & O2 Sat Independence with exercise equipment Improved SOB with ADL's Using PLB without cueing & demonstrates good technique Exercise tolerated well No report of cardiac concerns or symptoms Strength training completed today  Goals Unmet:  Not Applicable  Comments: Pt able to follow exercise prescription today without complaint.  Will continue to monitor for progression. Check out 1145   Dr. Sinda Du is Medical Director for [facility] Pulmonary Rehab.

## 2018-03-27 ENCOUNTER — Ambulatory Visit (INDEPENDENT_AMBULATORY_CARE_PROVIDER_SITE_OTHER): Payer: Medicare Other

## 2018-03-27 DIAGNOSIS — R001 Bradycardia, unspecified: Secondary | ICD-10-CM

## 2018-03-27 DIAGNOSIS — I441 Atrioventricular block, second degree: Secondary | ICD-10-CM

## 2018-03-28 ENCOUNTER — Encounter (HOSPITAL_COMMUNITY)
Admission: RE | Admit: 2018-03-28 | Discharge: 2018-03-28 | Disposition: A | Discharge status: Home / Self Care | Payer: Medicare Other | Source: Ambulatory Visit | Attending: Internal Medicine | Admitting: Internal Medicine

## 2018-03-28 DIAGNOSIS — I5032 Chronic diastolic (congestive) heart failure: Secondary | ICD-10-CM | POA: Diagnosis not present

## 2018-03-28 NOTE — Progress Notes (Signed)
Daily Session Note  Patient Details  Name: Julie Clay MRN: [id number] Date of Birth: 08/29/1940 Referring Provider:     PULMONARY REHAB OTHER RESP ORIENTATION from 03/15/2018 in [place]  Referring Provider  Caryl Comes      Encounter Date: 03/28/2018  Check In: Session Check In - 03/28/18 1045      Check-In   Supervising physician immediately available to respond to emergencies  See telemetry face sheet for immediately available MD    Location  [facility]    Staff Present  Russella Dar, MS, EP, [facility], Exercise Physiologist;Caroline Matters Zachery Conch, Exercise Physiologist    Medication changes reported      No    Fall or balance concerns reported     No    Warm-up and Cool-down  Performed as group-led instruction    Resistance Training Performed  Yes    VAD Patient?  No    PAD/SET Patient?  No      Pain Assessment   Currently in Pain?  No/denies    Pain Score  0-No pain    Multiple Pain Sites  No       Capillary Blood Glucose: No results found for this or any previous visit (from the past 24 hour(s)).    Social History   Tobacco Use  Smoking Status Never Smoker  Smokeless Tobacco Never Used    Goals Met:  Proper associated with RPD/PD & O2 Sat Independence with exercise equipment Using PLB without cueing & demonstrates good technique Exercise tolerated well No report of cardiac concerns or symptoms Strength training completed today  Goals Unmet:  Not Applicable  Comments: Pt able to follow exercise prescription today without complaint.  Will continue to monitor for progression. Check out 1145.   Dr. Sinda Du is Medical Director for [facility].

## 2018-03-29 NOTE — Progress Notes (Signed)
Remote pacemaker transmission.   

## 2018-03-30 ENCOUNTER — Encounter (HOSPITAL_COMMUNITY)
Admission: RE | Admit: 2018-03-30 | Discharge: 2018-03-30 | Disposition: A | Discharge status: Home / Self Care | Payer: Medicare Other | Source: Ambulatory Visit | Attending: Internal Medicine | Admitting: Internal Medicine

## 2018-03-30 DIAGNOSIS — I5032 Chronic diastolic (congestive) heart failure: Secondary | ICD-10-CM

## 2018-03-30 NOTE — Progress Notes (Signed)
Daily Session Note  Patient Details  Name: Julie Clay MRN: [id number] Date of Birth: 1940/05/26 Referring Provider:     PULMONARY REHAB OTHER RESP ORIENTATION from 03/15/2018 in [place]  Referring Provider  Caryl Comes      Encounter Date: 03/30/2018  Check In: Session Check In - 03/30/18 1045      Check-In   Supervising physician immediately available to respond to emergencies  See telemetry face sheet for immediately available MD    Location  [facility]    Staff Present  Russella Dar, MS, EP, [facility], Exercise Physiologist;Amanda Ballard, Exercise Physiologist;Terrel Manalo Wynetta Emery, RN, BSN    Medication changes reported      No    Fall or balance concerns reported     No    Warm-up and Cool-down  Performed as group-led instruction    Resistance Training Performed  Yes    VAD Patient?  No    PAD/SET Patient?  No      Pain Assessment   Currently in Pain?  No/denies    Pain Score  0-No pain    Multiple Pain Sites  No       Capillary Blood Glucose: No results found for this or any previous visit (from the past 24 hour(s)).    Social History   Tobacco Use  Smoking Status Never Smoker  Smokeless Tobacco Never Used    Goals Met:  Proper associated with RPD/PD & O2 Sat Independence with exercise equipment Improved SOB with ADL's Using PLB without cueing & demonstrates good technique Exercise tolerated well No report of cardiac concerns or symptoms Strength training completed today  Goals Unmet:  Not Applicable  Comments: Pt able to follow exercise prescription today without complaint.  Will continue to monitor for progression. Check out 11:45.   Dr. Sinda Du is Medical Director for [facility].

## 2018-04-04 ENCOUNTER — Encounter (HOSPITAL_COMMUNITY)
Admission: RE | Admit: 2018-04-04 | Discharge: 2018-04-04 | Disposition: A | Discharge status: Home / Self Care | Payer: Medicare Other | Source: Ambulatory Visit | Attending: Internal Medicine | Admitting: Internal Medicine

## 2018-04-04 DIAGNOSIS — I5032 Chronic diastolic (congestive) heart failure: Secondary | ICD-10-CM | POA: Diagnosis not present

## 2018-04-04 NOTE — Progress Notes (Signed)
Daily Session Note  Patient Details  Name: Julie Clay MRN: [id number] Date of Birth: 12-27-1940 Referring Provider:     PULMONARY REHAB OTHER RESP ORIENTATION from 03/15/2018 in [place]  Referring Provider  Caryl Comes      Encounter Date: 04/04/2018  Check In: Session Check In - 04/04/18 1045      Check-In   Supervising physician immediately available to respond to emergencies  See telemetry face sheet for immediately available MD    Location  [facility]    Staff Present  Russella Dar, MS, EP, [facility], Exercise Physiologist;Rodriguez Aguinaldo Zachery Conch, Exercise Physiologist    Medication changes reported      No    Fall or balance concerns reported     No    Warm-up and Cool-down  Performed as group-led instruction    Resistance Training Performed  Yes    VAD Patient?  No    PAD/SET Patient?  No      Pain Assessment   Currently in Pain?  No/denies    Pain Score  0-No pain    Multiple Pain Sites  No       Capillary Blood Glucose: No results found for this or any previous visit (from the past 24 hour(s)).    Social History   Tobacco Use  Smoking Status Never Smoker  Smokeless Tobacco Never Used    Goals Met:  Proper associated with RPD/PD & O2 Sat Independence with exercise equipment Exercise tolerated well No report of cardiac concerns or symptoms Strength training completed today  Goals Unmet:  Not applicable  Comments: Pt able to follow exercise prescription today without complaint.  Will continue to monitor for progression. Check out 1145.   Dr. Sinda Du is Medical Director for [facility].

## 2018-04-06 ENCOUNTER — Ambulatory Visit (INDEPENDENT_AMBULATORY_CARE_PROVIDER_SITE_OTHER): Payer: Medicare Other | Admitting: Internal Medicine

## 2018-04-06 ENCOUNTER — Encounter: Payer: Self-pay | Admitting: Internal Medicine

## 2018-04-06 ENCOUNTER — Encounter (HOSPITAL_COMMUNITY)
Admission: RE | Admit: 2018-04-06 | Discharge: 2018-04-06 | Disposition: A | Discharge status: Home / Self Care | Payer: Medicare Other | Source: Ambulatory Visit | Attending: Internal Medicine | Admitting: Internal Medicine

## 2018-04-06 ENCOUNTER — Other Ambulatory Visit: Payer: Self-pay | Admitting: Family Medicine

## 2018-04-06 VITALS — BP 106/64 | HR 91 | Ht 65.0 in | Wt 220.0 lb

## 2018-04-06 DIAGNOSIS — I5032 Chronic diastolic (congestive) heart failure: Secondary | ICD-10-CM

## 2018-04-06 DIAGNOSIS — R42 Dizziness and giddiness: Secondary | ICD-10-CM

## 2018-04-06 DIAGNOSIS — I441 Atrioventricular block, second degree: Secondary | ICD-10-CM

## 2018-04-06 DIAGNOSIS — Z950 Presence of cardiac pacemaker: Secondary | ICD-10-CM | POA: Diagnosis not present

## 2018-04-06 LAB — CUP PACEART INCLINIC DEVICE CHECK
Battery Remaining Longevity: 104 mo
Battery Voltage: 3.01 V
Brady Statistic RA Percent Paced: 4.4 %
Brady Statistic RV Percent Paced: 77 %
Date Time Interrogation Session: 20191219154005
Implantable Lead Implant Date: 20190306
Implantable Lead Location: 753860
Implantable Lead Model: 5076
Lead Channel Impedance Value: 325 Ohm
Lead Channel Impedance Value: 437.5 Ohm
Lead Channel Pacing Threshold Amplitude: 0.5 V
Lead Channel Pacing Threshold Amplitude: 0.75 V
Lead Channel Pacing Threshold Pulse Width: 0.5 ms
Lead Channel Pacing Threshold Pulse Width: 0.5 ms
Lead Channel Sensing Intrinsic Amplitude: 5 mV
Lead Channel Sensing Intrinsic Amplitude: 7.5 mV
Lead Channel Setting Pacing Amplitude: 2 V
Lead Channel Setting Pacing Amplitude: 2.5 V
Lead Channel Setting Sensing Sensitivity: 2 mV
MDC IDC LEAD IMPLANT DT: 20190306
MDC IDC LEAD LOCATION: 753859
MDC IDC MSMT LEADCHNL RA PACING THRESHOLD AMPLITUDE: 0.5 V
MDC IDC MSMT LEADCHNL RA PACING THRESHOLD PULSEWIDTH: 0.5 ms
MDC IDC MSMT LEADCHNL RV PACING THRESHOLD AMPLITUDE: 0.75 V
MDC IDC MSMT LEADCHNL RV PACING THRESHOLD PULSEWIDTH: 0.5 ms
MDC IDC PG IMPLANT DT: 20190306
MDC IDC PG SERIAL: 9001027
MDC IDC SET LEADCHNL RV PACING PULSEWIDTH: 0.5 ms

## 2018-04-06 NOTE — Progress Notes (Signed)
Daily Session Note  Patient Details  Name: Julie Clay MRN: [id number] Date of Birth: 12-12-1940 Referring Provider:     PULMONARY REHAB OTHER RESP ORIENTATION from 03/15/2018 in [place]  Referring Provider  Caryl Comes      Encounter Date: 04/06/2018  Check In: Session Check In - 04/06/18 1045      Check-In   Supervising physician immediately available to respond to emergencies  See telemetry face sheet for immediately available MD    Location  [facility]    Staff Present  Russella Dar, MS, EP, [facility], Exercise Physiologist;Amanda Ballard, Exercise Physiologist;Shristi Scheib Wynetta Emery, RN, BSN    Medication changes reported      No    Fall or balance concerns reported     No    Warm-up and Cool-down  Performed as group-led instruction    Resistance Training Performed  Yes    VAD Patient?  No    PAD/SET Patient?  No      Pain Assessment   Currently in Pain?  Yes    Pain Score  5     Pain Location  Hip    Pain Orientation  Right    Pain Descriptors / Indicators  Aching    Pain Type  Chronic pain    Pain Radiating Towards  NA    Pain Onset  Yesterday    Pain Frequency  Intermittent    Aggravating Factors   Weight bearing.    Pain Relieving Factors  Take Tylenol and uses a topical ointment.    Effect of Pain on Daily Activities  Limits activities at times.    Multiple Pain Sites  No       Capillary Blood Glucose: No results found for this or any previous visit (from the past 24 hour(s)).    Social History   Tobacco Use  Smoking Status Never Smoker  Smokeless Tobacco Never Used    Goals Met:  Proper associated with RPD/PD & O2 Sat Independence with exercise equipment Improved SOB with ADL's Using PLB without cueing & demonstrates good technique Exercise tolerated well No report of cardiac concerns or symptoms Strength training completed today  Goals Unmet:  Not Applicable  Comments: Pt able to follow exercise prescription today  without complaint.  Will continue to monitor for progression. Check out 1645.   Dr. Sinda Du is Medical Director for [facility].

## 2018-04-06 NOTE — Progress Notes (Signed)
Patient Care Team: [place], Modena Nunnery, MD as PCP - General (Family Medicine)   HPI  Julie Clay is a 77 y.o. female Seen in follow-up for a pacemaker implanted 3/19 for second-degree heart block and symptomatic bradycardia.  2/19 echocardiogram LVEF normal   Date Cr  K TSH  2/19 1.03 3.6 3.42   7/19 0.91 3.9     She is not any better really following pacemaker implantation.  Overall fluid status is somewhat better.  She remains edematous but is reluctant to take more diuretics as it seems to make it challenging for sputum production.  No chest pain.  Past Medical History:  Diagnosis Date  . Asthma    spirometry (2011)- mild ventilary defect  . Bone spur    "heels, knees, left shoulder" (06/22/2017)  . Breast cancer, left breast ([place]) dx'd 03/2011  . Chronic bronchitis ([place])   . COPD (chronic obstructive pulmonary disease) ([place])   . Fatty liver 2013   enlarged  . GERD (gastroesophageal reflux disease)   . HSV (herpes simplex virus) infection   . Hyperlipidemia   . Hypertension   . Lung nodule seen on imaging study 2013  . Lymphedema    Left arm  . 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 Osteoarthritis    "all over" (06/22/2017)  . Sleep apnea    Stop Bang score of 5. Pt has had sleep study, but was shown to be negative for sleep apnea.  . Type II diabetes mellitus ([place])   . Ventral hernia, unspecified, without mention of obstruction or gangrene     Past Surgical History:  Procedure Laterality Date  . BREAST BIOPSY Left 04/2011  . BREAST BIOPSY Left 06/22/13  . BREAST BIOPSY Left 09/12/2013   Procedure: BREAST BIOPSY WITH NEEDLE LOCALIZATION;  Surgeon: Jamesetta So, MD;  Location: [facility];  Service: General;  Laterality: Left;  . BREAST LUMPECTOMY Left 04/2011 X 2  . CATARACT EXTRACTION W/ INTRAOCULAR LENS  IMPLANT, BILATERAL Bilateral   . CATARACT EXTRACTION, BILATERAL     on different occassions   . [place]  . DILATION AND  CURETTAGE OF UTERUS  11/06/2013  . HAND LIGAMENT RECONSTRUCTION Right ~ 2010  . HYSTEROSCOPY W/D&C N/A 11/06/2013   Procedure: DILATATION AND CURETTAGE /HYSTEROSCOPY;  Surgeon: Jonnie Kind, MD;  Location: [facility];  Service: Gynecology;  Laterality: N/A;  . INSERT / REPLACE / REMOVE PACEMAKER  06/22/2017  . LUNG LOBECTOMY Right 1988   Fungal Infection  . PACEMAKER IMPLANT N/A 06/22/2017   Procedure: PACEMAKER IMPLANT;  Surgeon: Deboraha Sprang, MD;  Location: [facility];  Service: Cardiovascular;  Laterality: N/A;  . POLYPECTOMY N/A 11/06/2013   Procedure: POLYPECTOMY (REMOVAL ENDOMETRIAL POLYP);  Surgeon: Jonnie Kind, MD;  Location: [facility];  Service: Gynecology;  Laterality: N/A;  . RECTOCELE REPAIR N/A 11/06/2013   Procedure: POSTERIOR REPAIR (RECTOCELE);  Surgeon: Jonnie Kind, MD;  Location: [facility];  Service: Gynecology;  Laterality: N/A;  . SHOULDER ARTHROSCOPY WITH OPEN ROTATOR CUFF REPAIR Left 07/26/2012  . TONSILLECTOMY  1949  . TUBAL LIGATION      Current Meds  Medication Sig  . acetaminophen (TYLENOL) 500 MG tablet Take 500 mg by mouth as needed for moderate pain. Takes 2 when needed  . albuterol (PROVENTIL HFA;VENTOLIN HFA) 108 (90 Base) MCG/ACT inhaler Inhale 2 puffs into the lungs every 4 (four) hours as needed.  Marland Kitchen  Aloe Vera Leaf POWD Take 800 mg by mouth 2 (two) times daily.   . Ascorbic Acid (VITAMIN C) 1000 MG tablet Take 1,000 mg by mouth 2 (two) times daily.  Marland Kitchen b complex vitamins tablet Take 1 tablet by mouth 2 (two) times daily.   . Blood Glucose Monitoring Suppl (BLOOD GLUCOSE SYSTEM PAK) KIT Use as directed to monitor FSBS 1x daily. Dx: E11.9. Please dispense as Accu-Chek Aviva  . Cholecalciferol (VITAMIN D3) 2000 UNITS TABS Take 2,000 Units by mouth 2 (two) times daily.  Mariane Baumgarten Calcium (STOOL SOFTENER PO) Take 1 capsule by mouth as needed (constipation).   . Echinacea 380 MG CAPS Take 1 capsule by mouth as needed (cold/flue).  . fluocinonide cream  (LIDEX) 7.74 % Apply 1 application topically 2 (two) times daily as needed (poison ivy/bites/hives).  . Ginkgo Biloba 120 MG CAPS Take 1 capsule by mouth daily.   Marland Kitchen glipiZIDE (GLUCOTROL) 10 MG tablet TAKE 1 TABLET BY MOUTH TWICE DAILY BEFORE A MEAL  . glipiZIDE (GLUCOTROL) 10 MG tablet TAKE 1 TABLET BY MOUTH TWICE DAILY BEFORE A MEAL  . Glucose Blood (BLOOD GLUCOSE TEST STRIPS) STRP Use as directed to monitor FSBS 1x daily. Dx: E11.9. Please dispense as Accu-Chek Aviva  . KRILL OIL PO Take 1 capsule by mouth 2 (two) times daily.  . Lancets MISC Use as directed to monitor FSBS 1x daily. Dx: E11.9. Please dispense as Accu-Chek Aviva  . Melatonin 3 MG TABS Take 3 mg by mouth at bedtime as needed and may repeat dose one time if needed (sleep).   . metFORMIN (GLUCOPHAGE) 1000 MG tablet TAKE 1 TABLET BY MOUTH TWICE DAILY WITH A MEAL  . metoprolol succinate (TOPROL-XL) 25 MG 24 hr tablet Take 12.5 mg by mouth daily.  . mometasone-formoterol (DULERA) 100-5 MCG/ACT AERO Inhale 2 puffs into the lungs 2 (two) times daily.  . Multiple Vitamin (MULTIVITAMIN WITH MINERALS) TABS tablet Take 1 tablet by mouth daily.  . NONFORMULARY OR COMPOUNDED ITEM Apply 1 application topically 2 (two) times daily as needed. Reported on 06/10/2015  . rosuvastatin (CRESTOR) 5 MG tablet TAKE 1 TABLET(5 MG) BY MOUTH DAILY  . simethicone (MYLICON) 80 MG chewable tablet Chew 80 mg by mouth as needed for flatulence.   Marland Kitchen tiZANidine (ZANAFLEX) 4 MG tablet Take 2 mg by mouth every 6 (six) hours as needed for muscle spasms.  Marland Kitchen torsemide (DEMADEX) 20 MG tablet Take 10 mg by mouth daily.  Marland Kitchen triamcinolone cream (KENALOG) 0.1 % Apply 1 application topically 2 (two) times daily.  . TRULICITY 1.28 NO/6.7EH SOPN INJECT 1 PEN INTO THE SKIN ONCE EVERY WEEK    Allergies  Allergen Reactions  . Arimidex [Anastrozole] Other (See Comments)    Arthralgias and myalgias.  Improved with 3 week hiatus from drug.  Categorical side effect of drug class.    . Adhesive [Tape] Other (See Comments)    "red and blistered"  . Lisinopril     Dry hacking cough and tickle  . Tetracyclines & Related Other (See Comments)    unknown      Review of Systems negative except from HPI and PMH  Well developed and nourished in no acute distress HENT normal Neck supple with JVP-flat Clear Regular rate and rhythm, no murmurs or gallops Abd-soft with active BS No Clubbing cyanosis edema Skin-warm and dry A & Oriented  Grossly normal sensory and motor function   ECG sinus with P synchronous pacing with an PV interval of approximately 300 ms  Assessment and  Plan  Mobitz 2 second-degree heart block Intermittent  Dyspnea on exertion  Pacemaker St Jude   Lightheadedness  Irregularity and intermittent ventricular pacing  Congestive heart failure-chronic-diastolic   The pt shortness of breath is likely multifactorial.  She is volume overloaded.  She has COPD.  She is reluctant to make her diuresis more aggressive.  I also wonder whether her profound PV interval is not contributing; is about 300+ milliseconds.  Will shorten it to 200 ms.  Continue her current medications.  Will arrange heart failure follow-up for her up in [place] closer to home.  We will see her again in 1 year.       Current medicines are reviewed at length with the patient today .  The patient does not  have concerns regarding medicines.

## 2018-04-06 NOTE — Patient Instructions (Signed)
Medication Instructions:  Your physician recommends that you continue on your current medications as directed. Please refer to the Current Medication list given to you today.  Labwork: None ordered.  Testing/Procedures: None ordered.  Follow-Up: Your physician recommends that you schedule a follow-up appointment in:   3 months in [place] with a general cardiologist  12 months with Dr Caryl Comes  Any Other Special Instructions Will Be Listed Below (If Applicable).     If you need a refill on your cardiac medications before your next appointment, please call your pharmacy.

## 2018-04-07 DIAGNOSIS — M25551 Pain in right hip: Secondary | ICD-10-CM | POA: Diagnosis not present

## 2018-04-07 NOTE — Progress Notes (Signed)
Pulmonary Individual Treatment Plan  Patient Details  Name: Julie Clay MRN: [id number] Date of Birth: 1941/04/17 Referring Provider:     PULMONARY REHAB OTHER RESP ORIENTATION from 03/15/2018 in [place]  Referring Provider  Caryl Comes      Initial Encounter Date:    PULMONARY REHAB OTHER RESP ORIENTATION from 03/15/2018 in [place]  Date  03/15/18      Visit Diagnosis: Chronic diastolic CHF (congestive heart failure) ([place])  Patient's Home Medications on Admission:   Current Outpatient Medications:  .  acetaminophen (TYLENOL) 500 MG tablet, Take 500 mg by mouth as needed for moderate pain. Takes 2 when needed, Disp: , Rfl:  .  albuterol (PROVENTIL HFA;VENTOLIN HFA) 108 (90 Base) MCG/ACT inhaler, Inhale 2 puffs into the lungs every 4 (four) hours as needed., Disp: 6.7 g, Rfl: 6 .  Aloe Vera Leaf POWD, Take 800 mg by mouth 2 (two) times daily. , Disp: , Rfl:  .  Ascorbic Acid (VITAMIN C) 1000 MG tablet, Take 1,000 mg by mouth 2 (two) times daily., Disp: , Rfl:  .  b complex vitamins tablet, Take 1 tablet by mouth 2 (two) times daily. , Disp: , Rfl:  .  Blood Glucose Monitoring Suppl (BLOOD GLUCOSE SYSTEM PAK) KIT, Use as directed to monitor FSBS 1x daily. Dx: E11.9. Please dispense as Accu-Chek Aviva, Disp: 1 each, Rfl: 1 .  Cholecalciferol (VITAMIN D3) 2000 UNITS TABS, Take 2,000 Units by mouth 2 (two) times daily., Disp: , Rfl:  .  Docusate Calcium (STOOL SOFTENER PO), Take 1 capsule by mouth as needed (constipation). , Disp: , Rfl:  .  Echinacea 380 MG CAPS, Take 1 capsule by mouth as needed (cold/flue)., Disp: , Rfl:  .  fluocinonide cream (LIDEX) 2.77 %, Apply 1 application topically 2 (two) times daily as needed (poison ivy/bites/hives)., Disp: 30 g, Rfl: 11 .  Ginkgo Biloba 120 MG CAPS, Take 1 capsule by mouth daily. , Disp: , Rfl:  .  glipiZIDE (GLUCOTROL) 10 MG tablet, TAKE 1 TABLET BY MOUTH TWICE DAILY BEFORE A MEAL, Disp: 180  tablet, Rfl: 0 .  glipiZIDE (GLUCOTROL) 10 MG tablet, TAKE 1 TABLET BY MOUTH TWICE DAILY BEFORE A MEAL, Disp: 180 tablet, Rfl: 0 .  Glucose Blood (BLOOD GLUCOSE TEST STRIPS) STRP, Use as directed to monitor FSBS 1x daily. Dx: E11.9. Please dispense as Accu-Chek Aviva, Disp: 100 each, Rfl: 1 .  KRILL OIL PO, Take 1 capsule by mouth 2 (two) times daily., Disp: , Rfl:  .  Lancets MISC, Use as directed to monitor FSBS 1x daily. Dx: E11.9. Please dispense as Accu-Chek Aviva, Disp: 100 each, Rfl: 1 .  Melatonin 3 MG TABS, Take 3 mg by mouth at bedtime as needed and may repeat dose one time if needed (sleep). , Disp: , Rfl:  .  metFORMIN (GLUCOPHAGE) 1000 MG tablet, TAKE 1 TABLET BY MOUTH TWICE DAILY WITH A MEAL, Disp: 180 tablet, Rfl: 0 .  metoprolol succinate (TOPROL-XL) 25 MG 24 hr tablet, Take 12.5 mg by mouth daily., Disp: , Rfl:  .  mometasone-formoterol (DULERA) 100-5 MCG/ACT AERO, Inhale 2 puffs into the lungs 2 (two) times daily., Disp: 1 Inhaler, Rfl: 6 .  Multiple Vitamin (MULTIVITAMIN WITH MINERALS) TABS tablet, Take 1 tablet by mouth daily., Disp: , Rfl:  .  NONFORMULARY OR COMPOUNDED ITEM, Apply 1 application topically 2 (two) times daily as needed. Reported on 06/10/2015, Disp: , Rfl:  .  rosuvastatin (CRESTOR) 5 MG tablet, TAKE  1 TABLET(5 MG) BY MOUTH DAILY, Disp: 90 tablet, Rfl: 0 .  simethicone (MYLICON) 80 MG chewable tablet, Chew 80 mg by mouth as needed for flatulence. , Disp: , Rfl:  .  tiZANidine (ZANAFLEX) 4 MG tablet, Take 2 mg by mouth every 6 (six) hours as needed for muscle spasms., Disp: , Rfl:  .  torsemide (DEMADEX) 20 MG tablet, Take 10 mg by mouth daily., Disp: , Rfl:  .  triamcinolone cream (KENALOG) 0.1 %, Apply 1 application topically 2 (two) times daily., Disp: 30 g, Rfl: 0 .  TRULICITY 1.41 CV/0.1TH SOPN, INJECT 1 PEN INTO THE SKIN ONCE EVERY WEEK, Disp: 2 mL, Rfl: 2  Past Medical History: Past Medical History:  Diagnosis Date  . Asthma    spirometry (2011)- mild  ventilary defect  . Bone spur    "heels, knees, left shoulder" (06/22/2017)  . Breast cancer, left breast ([place]) dx'd 03/2011  . Chronic bronchitis ([place])   . COPD (chronic obstructive pulmonary disease) ([place])   . Fatty liver 2013   enlarged  . GERD (gastroesophageal reflux disease)   . HSV (herpes simplex virus) infection   . Hyperlipidemia   . Hypertension   . Lung nodule seen on imaging study 2013  . Lymphedema    Left arm  . 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 Osteoarthritis    "all over" (06/22/2017)  . Sleep apnea    Stop Bang score of 5. Pt has had sleep study, but was shown to be negative for sleep apnea.  . Type II diabetes mellitus ([place])   . Ventral hernia, unspecified, without mention of obstruction or gangrene     Tobacco Use: Social History   Tobacco Use  Smoking Status Never Smoker  Smokeless Tobacco Never Used    Labs: Recent Review Flowsheet Data    Labs for ITP Cardiac and Pulmonary Rehab Latest Ref Rng & Units 05/31/2016 09/20/2016 03/14/2017 07/08/2017 11/09/2017   Cholestrol <200 mg/dL 129 146 141 139 128   LDLCALC mg/dL (calc) 49 59 68 65 56   LDLDIRECT mg/dL - - - - -   HDL >50 mg/dL 46(L) 44(L) 47(L) 50(L) 45(L)   Trlycerides <150 mg/dL 170(H) 214(H) 181(H) 159(H) 197(H)   Hemoglobin A1c <5.7 % of total Hgb 7.0(H) 7.2(H) 6.8(H) 6.5(H) 6.9(H)      Capillary Blood Glucose: Lab Results  Component Value Date   GLUCAP 83 06/23/2017   GLUCAP 117 (H) 06/22/2017   GLUCAP 81 06/22/2017   GLUCAP 97 06/22/2017   GLUCAP 185 (H) 11/06/2013     Pulmonary Assessment Scores: Pulmonary Assessment Scores    Row Name 03/15/18 1437         ADL UCSD   ADL Phase  Entry     SOB Score total  69     Rest  0     Walk  11     Stairs  5     Bath  2     Dress  2     Shop  2       CAT Score   CAT Score  18       mMRC Score   mMRC Score  3        Pulmonary Function Assessment:   Exercise Target Goals: Exercise Program Goal: Individual  exercise prescription set using results from initial 6 min walk test and THRR while considering  patient's activity barriers and safety.   Exercise Prescription  Goal: Initial exercise prescription builds to 30-45 minutes a day of aerobic activity, 2-3 days per week.  Home exercise guidelines will be given to patient during program as part of exercise prescription that the participant will acknowledge.  Activity Barriers & Risk Stratification: Activity Barriers & Cardiac Risk Stratification - 03/15/18 1351      Activity Barriers & Cardiac Risk Stratification   Activity Barriers  Arthritis;Back Problems;Shortness of Breath;Deconditioning;Other (comment)    Comments  spurs in both heels and knees    Cardiac Risk Stratification  High       6 Minute Walk: 6 Minute Walk    Row Name 03/15/18 1350         6 Minute Walk   Phase  Initial     Distance  900 feet     Walk Time  6 minutes     # of Rest Breaks  2     MPH  1.7     METS  2.31     RPE  14     Perceived Dyspnea   14     VO2 Peak  5.49     Symptoms  Yes (comment)     Comments  SOB     Resting HR  93 bpm     Resting BP  122/68     Resting Oxygen Saturation   98 %     Exercise Oxygen Saturation  during 6 min walk  95 %     Max Ex. HR  110 bpm     Max Ex. BP  144/64     2 Minute Post BP  126/62        Oxygen Initial Assessment: Oxygen Initial Assessment - 03/15/18 1436      Home Oxygen   Home Oxygen Device  None    Sleep Oxygen Prescription  None    Home Exercise Oxygen Prescription  None    Home at Rest Exercise Oxygen Prescription  None      Initial 6 min Walk   Oxygen Used  None      Program Oxygen Prescription   Program Oxygen Prescription  None       Oxygen Re-Evaluation: Oxygen Re-Evaluation    Row Name 04/07/18 1313             Program Oxygen Prescription   Program Oxygen Prescription  None         Home Oxygen   Home Oxygen Device  None       Sleep Oxygen Prescription  None       Home  Exercise Oxygen Prescription  None       Home at Rest Exercise Oxygen Prescription  None         Goals/Expected Outcomes   Short Term Goals  To learn and understand importance of monitoring SPO2 with pulse oximeter and demonstrate accurate use of the pulse oximeter.;To learn and understand importance of maintaining oxygen saturations>88%;To learn and demonstrate proper pursed lip breathing techniques or other breathing techniques.       Long  Term Goals  Verbalizes importance of monitoring SPO2 with pulse oximeter and return demonstration;Maintenance of O2 saturations>88%;Exhibits proper breathing techniques, such as pursed lip breathing or other method taught during program session          Oxygen Discharge (Final Oxygen Re-Evaluation): Oxygen Re-Evaluation - 04/07/18 1313      Program Oxygen Prescription   Program Oxygen Prescription  None      Home Oxygen  Home Oxygen Device  None    Sleep Oxygen Prescription  None    Home Exercise Oxygen Prescription  None    Home at Rest Exercise Oxygen Prescription  None      Goals/Expected Outcomes   Short Term Goals  To learn and understand importance of monitoring SPO2 with pulse oximeter and demonstrate accurate use of the pulse oximeter.;To learn and understand importance of maintaining oxygen saturations>88%;To learn and demonstrate proper pursed lip breathing techniques or other breathing techniques.    Long  Term Goals  Verbalizes importance of monitoring SPO2 with pulse oximeter and return demonstration;Maintenance of O2 saturations>88%;Exhibits proper breathing techniques, such as pursed lip breathing or other method taught during program session       Initial Exercise Prescription: Initial Exercise Prescription - 03/15/18 1400      Date of Initial Exercise RX and Referring Provider   Date  03/15/18    Referring Provider  Caryl Comes    Expected Discharge Date  06/15/18      NuStep   Level  1    SPM  60    Minutes  17    METs  1.7       Arm Ergometer   Level  1    Watts  11    RPM  50    Minutes  17    METs  1.8      Prescription Details   Frequency (times per week)  2    Duration  Progress to 30 minutes of continuous aerobic without signs/symptoms of physical distress      Intensity   THRR 40-80% of Max Heartrate  [phone]    Ratings of Perceived Exertion  11-13    Perceived Dyspnea  0-4      Progression   Progression  Continue to progress workloads to maintain intensity without signs/symptoms of physical distress.      Resistance Training   Training Prescription  Yes    Weight  1    Reps  10-15       Perform Capillary Blood Glucose checks as needed.  Exercise Prescription Changes:  Exercise Prescription Changes    Row Name 03/15/18 1400 04/06/18 1500           Response to Exercise   Blood Pressure (Admit)  -  128/68      Blood Pressure (Exercise)  -  146/60      Blood Pressure (Exit)  -  112/60      Heart Rate (Admit)  -  106 bpm      Heart Rate (Exercise)  -  104 bpm      Heart Rate (Exit)  -  98 bpm      Oxygen Saturation (Admit)  -  96 %      Oxygen Saturation (Exercise)  -  95 %      Oxygen Saturation (Exit)  -  98 %      Rating of Perceived Exertion (Exercise)  -  13      Perceived Dyspnea (Exercise)  -  14      Symptoms  -  R hip pain      Comments  -  first two weeks of exercise!       Duration  -  Progress to 30 minutes of  aerobic without signs/symptoms of physical distress      Intensity  -  THRR New [phone]        Progression   Progression  -  Continue to progress workloads to maintain intensity without signs/symptoms of physical distress.      Average METs  -  2        Resistance Training   Training Prescription  -  Yes      Weight  -  2      Reps  -  10-15        NuStep   Level  -  2      SPM  -  80      Minutes  -  17      METs  -  1.5        Arm Ergometer   Level  -  2      Watts  -  20      RPM  -  53      Minutes  -  22      METs  -  2.5         Home Exercise Plan   Plans to continue exercise at  Home (comment)  Home (comment)      Frequency  Add 3 additional days to program exercise sessions.  Add 3 additional days to program exercise sessions.      Initial Home Exercises Provided  03/15/18  03/15/18         Exercise Comments:  Exercise Comments    Row Name 04/06/18 1516           Exercise Comments  Pt. has done well in the program so far. She has tolerated the exercises and increases with ease. Will continue to progress her as tolerated.           Exercise Goals and Review:  Exercise Goals    Row Name 03/15/18 1351             Exercise Goals   Increase Physical Activity  Yes       Intervention  Provide advice, education, support and counseling about physical activity/exercise needs.;Develop an individualized exercise prescription for aerobic and resistive training based on initial evaluation findings, risk stratification, comorbidities and participant's personal goals.       Expected Outcomes  Short Term: Attend rehab on a regular basis to increase amount of physical activity.       Increase Strength and Stamina  Yes       Intervention  Provide advice, education, support and counseling about physical activity/exercise needs.;Develop an individualized exercise prescription for aerobic and resistive training based on initial evaluation findings, risk stratification, comorbidities and participant's personal goals.       Expected Outcomes  Short Term: Increase workloads from initial exercise prescription for resistance, speed, and METs.       Able to understand and use rate of perceived exertion (RPE) scale  Yes       Intervention  Provide education and explanation on how to use RPE scale       Expected Outcomes  Short Term: Able to use RPE daily in rehab to express subjective intensity level;Long Term:  Able to use RPE to guide intensity level when exercising independently       Able to understand and use Dyspnea  scale  Yes       Intervention  Provide education and explanation on how to use Dyspnea scale       Expected Outcomes  Short Term: Able to use Dyspnea scale daily in rehab to express subjective sense of shortness of breath during exertion;Long Term: Able to use  Dyspnea scale to guide intensity level when exercising independently       Knowledge and understanding of Target Heart Rate Range (THRR)  Yes       Intervention  Provide education and explanation of THRR including how the numbers were predicted and where they are located for reference       Expected Outcomes  Short Term: Able to state/look up THRR       Able to check pulse independently  Yes       Intervention  Provide education and demonstration on how to check pulse in carotid and radial arteries.;Review the importance of being able to check your own pulse for safety during independent exercise       Expected Outcomes  Short Term: Able to explain why pulse checking is important during independent exercise;Long Term: Able to check pulse independently and accurately       Understanding of Exercise Prescription  Yes       Intervention  Provide education, explanation, and written materials on patient's individual exercise prescription       Expected Outcomes  Short Term: Able to explain program exercise prescription;Long Term: Able to explain home exercise prescription to exercise independently          Exercise Goals Re-Evaluation : Exercise Goals Re-Evaluation    [place] Name 04/06/18 1514             Exercise Goal Re-Evaluation   Exercise Goals Review  Increase Physical Activity;Increase Strength and Stamina;Able to understand and use Dyspnea scale;Able to understand and use rate of perceived exertion (RPE) scale;Knowledge and understanding of Target Heart Rate Range (THRR);Able to check pulse independently;Improve claudication pain tolerance and improve walking ability;Understanding of Exercise Prescription       Comments  Pt. has  attended 7 exercise sessions so far. She has tolerated the exercise well, other than some R hip pain that she feels started outside of rehab. We will continue to monitor and progress as we see fit.        Expected Outcomes  increase strength and stamina.           Discharge Exercise Prescription (Final Exercise Prescription Changes): Exercise Prescription Changes - 04/06/18 1500      Response to Exercise   Blood Pressure (Admit)  128/68    Blood Pressure (Exercise)  146/60    Blood Pressure (Exit)  112/60    Heart Rate (Admit)  106 bpm    Heart Rate (Exercise)  104 bpm    Heart Rate (Exit)  98 bpm    Oxygen Saturation (Admit)  96 %    Oxygen Saturation (Exercise)  95 %    Oxygen Saturation (Exit)  98 %    Rating of Perceived Exertion (Exercise)  13    Perceived Dyspnea (Exercise)  14    Symptoms  R hip pain    Comments  first two weeks of exercise!     Duration  Progress to 30 minutes of  aerobic without signs/symptoms of physical distress    Intensity  THRR New   [phone]     Progression   Progression  Continue to progress workloads to maintain intensity without signs/symptoms of physical distress.    Average METs  2      Resistance Training   Training Prescription  Yes    Weight  2    Reps  10-15      NuStep   Level  2    SPM  80  Minutes  17    METs  1.5      Arm Ergometer   Level  2    Watts  20    RPM  53    Minutes  22    METs  2.5      Home Exercise Plan   Plans to continue exercise at  Home (comment)    Frequency  Add 3 additional days to program exercise sessions.    Initial Home Exercises Provided  03/15/18       Nutrition:  Target Goals: Understanding of nutrition guidelines, daily intake of sodium <1547m, cholesterol <2037m calories 30% from fat and 7% or less from saturated fats, daily to have 5 or more servings of fruits and vegetables.  Biometrics: Pre Biometrics - 03/15/18 1352      Pre Biometrics   Height  5' 5"  (1.651 m)     Weight  101.4 kg    Waist Circumference  44.5 inches    Hip Circumference  58.5 inches    Waist to Hip Ratio  0.76 %    BMI (Calculated)  37.2    Triceps Skinfold  29 mm    % Body Fat  47.9 %    Grip Strength  16.7 kg    Flexibility  0 in    Single Leg Stand  7.38 seconds        Nutrition Therapy Plan and Nutrition Goals: Nutrition Therapy & Goals - 04/07/18 1315      Nutrition Therapy   RD appointment deferred  Yes      Personal Nutrition Goals   Comments  Encouraged patient to attend next RD appointment in Janurary. She says she trys to follow a diabetic diet.       Intervention Plan   Intervention  Nutrition handout(s) given to patient.       Nutrition Assessments: Nutrition Assessments - 03/15/18 1439      MEDFICTS Scores   Pre Score  53       Nutrition Goals Re-Evaluation:   Nutrition Goals Discharge (Final Nutrition Goals Re-Evaluation):   Psychosocial: Target Goals: Acknowledge presence or absence of significant depression and/or stress, maximize coping skills, provide positive support system. Participant is able to verbalize types and ability to use techniques and skills needed for reducing stress and depression.  Initial Review & Psychosocial Screening: Initial Psych Review & Screening - 03/15/18 1438      Initial Review   Current issues with  None Identified      Family Dynamics   Good Support System?  Yes      Barriers   Psychosocial barriers to participate in program  There are no identifiable barriers or psychosocial needs.      Screening Interventions   Interventions  Encouraged to exercise    Expected Outcomes  Short Term goal: Identification and review with participant of any Quality of Life or Depression concerns found by scoring the questionnaire.;Long Term goal: The participant improves quality of Life and PHQ9 Scores as seen by post scores and/or verbalization of changes       Quality of Life Scores: Quality of Life - 03/15/18 1353       Quality of Life   Select  Quality of Life      Quality of Life Scores   Health/Function Pre  22.37 %    Socioeconomic Pre  23.33 %    Psych/Spiritual Pre  22.79 %    Family Pre  22.9 %    GLOBAL  Pre  22.71 %      Scores of 19 and below usually indicate a poorer quality of life in these areas.  A difference of  2-3 points is a clinically meaningful difference.  A difference of 2-3 points in the total score of the Quality of Life Index has been associated with significant improvement in overall quality of life, self-image, physical symptoms, and general health in studies assessing change in quality of life.   PHQ-9: Recent Review Flowsheet Data    Depression screen [facility] 2/9 03/15/2018 11/11/2017 07/07/2017 03/15/2017 09/28/2016   Decreased Interest 0 0 0 0 0   Down, Depressed, Hopeless 0 0 0 0 0   PHQ - 2 Score 0 0 0 0 0   Altered sleeping 0 - - 1 0   Tired, decreased energy 3 - - 1 0   Change in appetite - - - 0 0   Feeling bad or failure about yourself  0 - - 0 0   Trouble concentrating 0 - - 0 0   Moving slowly or fidgety/restless 0 - - 0 0   Suicidal thoughts 0 - - 0 0   PHQ-9 Score 3 - - 2 0   Difficult doing work/chores Somewhat difficult - - Not difficult at all Not difficult at all     Interpretation of Total Score  Total Score Depression Severity:  1-4 = Minimal depression, 5-9 = Mild depression, 10-14 = Moderate depression, 15-19 = Moderately severe depression, 20-27 = Severe depression   Psychosocial Evaluation and Intervention: Psychosocial Evaluation - 03/15/18 1439      Psychosocial Evaluation & Interventions   Interventions  Encouraged to exercise with the program and follow exercise prescription    Continue Psychosocial Services   No Follow up required       Psychosocial Re-Evaluation: Psychosocial Re-Evaluation    Row Name 04/07/18 1320             Psychosocial Re-Evaluation   Current issues with  None Identified       Comments  Patient's initial  QOL score was 22.71 and her PHQ-9 score was 3 with no psychosocial issues identified.        Expected Outcomes  Patient will have no psychosocial issues identified at discharge.        Interventions  Encouraged to attend Pulmonary Rehabilitation for the exercise;Relaxation education;Stress management education       Continue Psychosocial Services   No Follow up required          Psychosocial Discharge (Final Psychosocial Re-Evaluation): Psychosocial Re-Evaluation - 04/07/18 1320      Psychosocial Re-Evaluation   Current issues with  None Identified    Comments  Patient's initial QOL score was 22.71 and her PHQ-9 score was 3 with no psychosocial issues identified.     Expected Outcomes  Patient will have no psychosocial issues identified at discharge.     Interventions  Encouraged to attend Pulmonary Rehabilitation for the exercise;Relaxation education;Stress management education    Continue Psychosocial Services   No Follow up required        Education: Education Goals: Education classes will be provided on a weekly basis, covering required topics. Participant will state understanding/return demonstration of topics presented.  Learning Barriers/Preferences: Learning Barriers/Preferences - 03/15/18 1440      Learning Barriers/Preferences   Learning Barriers  None    Learning Preferences  Audio;Computer/Internet;Group Instruction;Individual Instruction;Pictoral;Skilled Demonstration;Verbal Instruction;Video;Written Material       Education Topics: How Lungs Work and  Diseases: - Discuss the anatomy of the lungs and diseases that can affect the lungs, such as COPD.   Exercise: -Discuss the importance of exercise, FITT principles of exercise, normal and abnormal responses to exercise, and how to exercise safely.   Environmental Irritants: -Discuss types of environmental irritants and how to limit exposure to environmental irritants.   Meds/Inhalers and oxygen: - Discuss  respiratory medications, definition of an inhaler and oxygen, and the proper way to use an inhaler and oxygen.   Energy Saving Techniques: - Discuss methods to conserve energy and decrease shortness of breath when performing activities of daily living.    Bronchial Hygiene / Breathing Techniques: - Discuss breathing mechanics, pursed-lip breathing technique,  proper posture, effective ways to clear airways, and other functional breathing techniques   PULMONARY REHAB OTHER RESPIRATORY from 04/06/2018 in [place]  Date  03/23/18  Educator  Wynetta Emery  Instruction Review Code  2- Demonstrated Understanding      Cleaning Equipment: - Provides group verbal and written instruction about the health risks of elevated stress, cause of high stress, and healthy ways to reduce stress.   PULMONARY REHAB OTHER RESPIRATORY from 04/06/2018 in [place]  Date  03/30/18  Educator  Wynetta Emery  Instruction Review Code  2- Demonstrated Understanding      Nutrition I: Fats: - Discuss the types of cholesterol, what cholesterol does to the body, and how cholesterol levels can be controlled.   PULMONARY REHAB OTHER RESPIRATORY from 04/06/2018 in [place]  Date  04/06/18  Educator  Wynetta Emery  Instruction Review Code  2- Demonstrated Understanding      Nutrition II: Labels: -Discuss the different components of food labels and how to read food labels.   Respiratory Infections: - Discuss the signs and symptoms of respiratory infections, ways to prevent respiratory infections, and the importance of seeking medical treatment when having a respiratory infection.   Stress I: Signs and Symptoms: - Discuss the causes of stress, how stress may lead to anxiety and depression, and ways to limit stress.   Stress II: Relaxation: -Discuss relaxation techniques to limit stress.   Oxygen for Home/Travel: - Discuss how to prepare for travel  when on oxygen and proper ways to transport and store oxygen to ensure safety.   Knowledge Questionnaire Score: Knowledge Questionnaire Score - 03/15/18 1440      Knowledge Questionnaire Score   Pre Score  23/24       Core Components/Risk Factors/Patient Goals at Admission: Personal Goals and Risk Factors at Admission - 03/15/18 1440      Core Components/Risk Factors/Patient Goals on Admission    Weight Management  Weight Maintenance    Diabetes  Yes    Intervention  Provide education about signs/symptoms and action to take for hypo/hyperglycemia.;Provide education about proper nutrition, including hydration, and aerobic/resistive exercise prescription along with prescribed medications to achieve blood glucose in normal ranges: Fasting glucose 65-99 mg/dL    Expected Outcomes  Short Term: Participant verbalizes understanding of the signs/symptoms and immediate care of hyper/hypoglycemia, proper foot care and importance of medication, aerobic/resistive exercise and nutrition plan for blood glucose control.;Long Term: Attainment of HbA1C < 7%.    Personal Goal Other  Yes    Personal Goal  Get more stamina, continue to gain strength and stamina    Intervention  Attend PR 2 x week and supplement with home exercise 3 x week.     Expected Outcomes  Reach personal goals of gaining  stamina       Core Components/Risk Factors/Patient Goals Review:  Goals and Risk Factor Review    Row Name 04/07/18 1317             Core Components/Risk Factors/Patient Goals Review   Personal Goals Review  Weight Management/Obesity;Improve shortness of breath with ADL's;Diabetes Increase strength and stamina.        Review  Patient has completed 7 sessions. She is doing well in the program with progression. Her last A1C on file was 11/09/17 at 6.9. Her fasting reported glucose readings are usually >200. She has advised on conpliance to a diabetic diet. She says she does not feel any stronger but the program  has helped her "get moving". She hopes to meet her goals as she continues the program.        Expected Outcomes  Patient will continue to attend sessions and complete the program meeting her personal goals.           Core Components/Risk Factors/Patient Goals at Discharge (Final Review):  Goals and Risk Factor Review - 04/07/18 1317      Core Components/Risk Factors/Patient Goals Review   Personal Goals Review  Weight Management/Obesity;Improve shortness of breath with ADL's;Diabetes   Increase strength and stamina.    Review  Patient has completed 7 sessions. She is doing well in the program with progression. Her last A1C on file was 11/09/17 at 6.9. Her fasting reported glucose readings are usually >200. She has advised on conpliance to a diabetic diet. She says she does not feel any stronger but the program has helped her "get moving". She hopes to meet her goals as she continues the program.     Expected Outcomes  Patient will continue to attend sessions and complete the program meeting her personal goals.        ITP Comments: ITP Comments    Row Name 03/15/18 1435 03/20/18 1504         ITP Comments  Mrs. Haith is a pleasant 77 year old female that has CHF. 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   Patient is new to program. She plans to start 03/21/18. Will continue to monitor for progress.          Comments: ITP REVIEW Pt is making expected progress toward pulmonary rehab goals after completing 7 sessions. Recommend continued exercise, life style modification, education, and utilization of breathing techniques to increase stamina and strength and decrease shortness of breath with exertion.

## 2018-04-11 ENCOUNTER — Encounter (HOSPITAL_COMMUNITY): Payer: Medicare Other

## 2018-04-13 ENCOUNTER — Other Ambulatory Visit: Payer: Self-pay | Admitting: Family Medicine

## 2018-04-13 ENCOUNTER — Encounter (HOSPITAL_COMMUNITY): Payer: Medicare Other

## 2018-04-18 ENCOUNTER — Encounter (HOSPITAL_COMMUNITY)
Admission: RE | Admit: 2018-04-18 | Discharge: 2018-04-18 | Disposition: A | Discharge status: Home / Self Care | Payer: Medicare Other | Source: Ambulatory Visit | Attending: Internal Medicine | Admitting: Internal Medicine

## 2018-04-18 ENCOUNTER — Other Ambulatory Visit (HOSPITAL_COMMUNITY): Payer: Self-pay | Admitting: Nurse Practitioner

## 2018-04-18 DIAGNOSIS — I5032 Chronic diastolic (congestive) heart failure: Secondary | ICD-10-CM | POA: Diagnosis not present

## 2018-04-18 DIAGNOSIS — N6310 Unspecified lump in the right breast, unspecified quadrant: Secondary | ICD-10-CM

## 2018-04-18 NOTE — Progress Notes (Signed)
Daily Session Note  Patient Details  Name: Julie Clay MRN: [id number] Date of Birth: February 12, 1941 Referring Provider:     PULMONARY REHAB OTHER RESP ORIENTATION from 03/15/2018 in [place]  Referring Provider  Caryl Comes      Encounter Date: 04/18/2018  Check In: Session Check In - 04/18/18 1045      Check-In   Supervising physician immediately available to respond to emergencies  See telemetry face sheet for immediately available MD    Location  [facility]    Staff Present  Russella Dar, MS, EP, [facility], Exercise Physiologist;Freddy Kinne Zachery Conch, Exercise Physiologist    Medication changes reported      No    Fall or balance concerns reported     No    Warm-up and Cool-down  Performed as group-led instruction    Resistance Training Performed  Yes    VAD Patient?  No    PAD/SET Patient?  No      Pain Assessment   Currently in Pain?  No/denies    Pain Score  0-No pain    Multiple Pain Sites  No       Capillary Blood Glucose: No results found for this or any previous visit (from the past 24 hour(s)).    Social History   Tobacco Use  Smoking Status Never Smoker  Smokeless Tobacco Never Used    Goals Met:  Proper associated with RPD/PD & O2 Sat Independence with exercise equipment Using PLB without cueing & demonstrates good technique Exercise tolerated well No report of cardiac concerns or symptoms Strength training completed today  Goals Unmet:  Not Applicable  Comments: Pt able to follow exercise prescription today without complaint.  Will continue to monitor for progression. Check out 1145.   Dr. Kate Sable is Medical Director for [facility].

## 2018-04-20 ENCOUNTER — Encounter (HOSPITAL_COMMUNITY)
Admission: RE | Admit: 2018-04-20 | Discharge: 2018-04-20 | Disposition: A | Discharge status: Home / Self Care | Payer: Medicare Other | Source: Ambulatory Visit | Attending: Internal Medicine | Admitting: Internal Medicine

## 2018-04-20 DIAGNOSIS — I5032 Chronic diastolic (congestive) heart failure: Secondary | ICD-10-CM | POA: Diagnosis not present

## 2018-04-20 NOTE — Progress Notes (Signed)
Daily Session Note  Patient Details  Name: Julie Clay MRN: [id number] Date of Birth: 12/16/1940 Referring Provider:     PULMONARY REHAB OTHER RESP ORIENTATION from 03/15/2018 in [place]  Referring Provider  Caryl Comes      Encounter Date: 04/20/2018  Check In: Session Check In - 04/20/18 1045      Check-In   Supervising physician immediately available to respond to emergencies  See telemetry face sheet for immediately available MD    Location  [facility]    Staff Present  Russella Dar, MS, EP, [facility], Exercise Physiologist;Amanda Zachery Conch, Exercise Physiologist    Medication changes reported      No    Fall or balance concerns reported     No    Warm-up and Cool-down  Performed as group-led instruction    Resistance Training Performed  Yes    VAD Patient?  No    PAD/SET Patient?  No      Pain Assessment   Currently in Pain?  No/denies    Pain Score  0-No pain    Multiple Pain Sites  No       Capillary Blood Glucose: No results found for this or any previous visit (from the past 24 hour(s)).    Social History   Tobacco Use  Smoking Status Never Smoker  Smokeless Tobacco Never Used    Goals Met:  Proper associated with RPD/PD & O2 Sat Independence with exercise equipment Improved SOB with ADL's Using PLB without cueing & demonstrates good technique Exercise tolerated well No report of cardiac concerns or symptoms Strength training completed today  Goals Unmet:  Not Applicable  Comments: Pt able to follow exercise prescription today without complaint.  Will continue to monitor for progression. Check out 1145.   Dr. Sinda Du is Medical Director for [facility] Pulmonary Re[facility].

## 2018-04-21 ENCOUNTER — Other Ambulatory Visit: Payer: Self-pay | Admitting: Family Medicine

## 2018-04-21 DIAGNOSIS — N182 Chronic kidney disease, stage 2 (mild): Secondary | ICD-10-CM | POA: Diagnosis not present

## 2018-04-21 DIAGNOSIS — Z0000 Encounter for general adult medical examination without abnormal findings: Secondary | ICD-10-CM | POA: Diagnosis not present

## 2018-04-21 DIAGNOSIS — K760 Fatty (change of) liver, not elsewhere classified: Secondary | ICD-10-CM | POA: Diagnosis not present

## 2018-04-21 DIAGNOSIS — I10 Essential (primary) hypertension: Secondary | ICD-10-CM | POA: Diagnosis not present

## 2018-04-21 DIAGNOSIS — E785 Hyperlipidemia, unspecified: Secondary | ICD-10-CM | POA: Diagnosis not present

## 2018-04-21 DIAGNOSIS — E1143 Type 2 diabetes mellitus with diabetic autonomic (poly)neuropathy: Secondary | ICD-10-CM | POA: Diagnosis not present

## 2018-04-22 LAB — CBC WITH DIFFERENTIAL/PLATELET
Absolute Monocytes: 672 cells/uL (ref 200–950)
BASOS ABS: 29 {cells}/uL (ref 0–200)
Basophils Relative: 0.3 %
Eosinophils Absolute: 230 cells/uL (ref 15–500)
Eosinophils Relative: 2.4 %
HCT: 40.5 % (ref 35.0–45.0)
Hemoglobin: 13.5 g/dL (ref 11.7–15.5)
Lymphs Abs: 2755 cells/uL (ref 850–3900)
MCH: 27.1 pg (ref 27.0–33.0)
MCHC: 33.3 g/dL (ref 32.0–36.0)
MCV: 81.3 fL (ref 80.0–100.0)
MPV: 11 fL (ref 7.5–12.5)
Monocytes Relative: 7 %
NEUTROS PCT: 61.6 %
Neutro Abs: 5914 cells/uL (ref 1500–7800)
Platelets: 245 10*3/uL (ref 140–400)
RBC: 4.98 10*6/uL (ref 3.80–5.10)
RDW: 13.6 % (ref 11.0–15.0)
Total Lymphocyte: 28.7 %
WBC: 9.6 10*3/uL (ref 3.8–10.8)

## 2018-04-22 LAB — COMPLETE METABOLIC PANEL WITH GFR
AG Ratio: 1.7 (calc) (ref 1.0–2.5)
ALBUMIN MSPROF: 4 g/dL (ref 3.6–5.1)
ALT: 14 U/L (ref 6–29)
AST: 17 U/L (ref 10–35)
Alkaline phosphatase (APISO): 51 U/L (ref 33–130)
BUN: 22 mg/dL (ref 7–25)
CO2: 29 mmol/L (ref 20–32)
Calcium: 9.4 mg/dL (ref 8.6–10.4)
Chloride: 106 mmol/L (ref 98–110)
Creat: 0.81 mg/dL (ref 0.60–0.93)
GFR, EST AFRICAN AMERICAN: 82 mL/min/{1.73_m2} (ref >=60)
GFR, EST NON AFRICAN AMERICAN: 71 mL/min/{1.73_m2} (ref >=60)
Globulin: 2.3 g/dL (calc) (ref 1.9–3.7)
Glucose, Bld: 86 mg/dL (ref 65–99)
Potassium: 4.1 mmol/L (ref 3.5–5.3)
Sodium: 143 mmol/L (ref 135–146)
Total Bilirubin: 0.5 mg/dL (ref 0.2–1.2)
Total Protein: 6.3 g/dL (ref 6.1–8.1)

## 2018-04-22 LAB — LIPID PANEL
Cholesterol: 146 mg/dL (ref <200)
HDL: 48 mg/dL — ABNORMAL LOW (ref >50)
LDL Cholesterol (Calc): 73 mg/dL (calc)
Non-HDL Cholesterol (Calc): 98 mg/dL (calc) (ref <130)
Total CHOL/HDL Ratio: 3 (calc) (ref <5.0)
Triglycerides: 169 mg/dL — ABNORMAL HIGH (ref <150)

## 2018-04-22 LAB — HEMOGLOBIN A1C
EAG (MMOL/L): 7.7 (calc)
Hgb A1c MFr Bld: 6.5 % of total Hgb — ABNORMAL HIGH (ref <5.7)
MEAN PLASMA GLUCOSE: 140 (calc)

## 2018-04-24 ENCOUNTER — Ambulatory Visit (HOSPITAL_COMMUNITY): Payer: Medicare Other | Admitting: Hematology

## 2018-04-25 ENCOUNTER — Encounter (HOSPITAL_COMMUNITY): Payer: Medicare Other

## 2018-04-25 ENCOUNTER — Other Ambulatory Visit: Payer: Self-pay

## 2018-04-25 ENCOUNTER — Ambulatory Visit (HOSPITAL_COMMUNITY)
Admission: RE | Admit: 2018-04-25 | Discharge: 2018-04-25 | Disposition: A | Discharge status: Home / Self Care | Payer: Medicare Other | Source: Ambulatory Visit | Attending: Nurse Practitioner | Admitting: Nurse Practitioner

## 2018-04-25 ENCOUNTER — Ambulatory Visit (HOSPITAL_COMMUNITY): Admission: RE | Admit: 2018-04-25 | Payer: Medicare Other | Source: Ambulatory Visit

## 2018-04-25 ENCOUNTER — Ambulatory Visit (INDEPENDENT_AMBULATORY_CARE_PROVIDER_SITE_OTHER): Payer: Medicare Other | Admitting: Family Medicine

## 2018-04-25 ENCOUNTER — Encounter: Payer: Self-pay | Admitting: Family Medicine

## 2018-04-25 VITALS — BP 124/66 | HR 76 | Temp 98.5°F | Resp 16 | Ht 65.0 in | Wt 222.0 lb

## 2018-04-25 DIAGNOSIS — E1143 Type 2 diabetes mellitus with diabetic autonomic (poly)neuropathy: Secondary | ICD-10-CM | POA: Diagnosis not present

## 2018-04-25 DIAGNOSIS — K760 Fatty (change of) liver, not elsewhere classified: Secondary | ICD-10-CM

## 2018-04-25 DIAGNOSIS — I10 Essential (primary) hypertension: Secondary | ICD-10-CM | POA: Diagnosis not present

## 2018-04-25 DIAGNOSIS — M150 Primary generalized (osteo)arthritis: Secondary | ICD-10-CM | POA: Diagnosis not present

## 2018-04-25 DIAGNOSIS — L84 Corns and callosities: Secondary | ICD-10-CM

## 2018-04-25 DIAGNOSIS — N6310 Unspecified lump in the right breast, unspecified quadrant: Secondary | ICD-10-CM

## 2018-04-25 DIAGNOSIS — M8949 Other hypertrophic osteoarthropathy, multiple sites: Secondary | ICD-10-CM

## 2018-04-25 DIAGNOSIS — N6312 Unspecified lump in the right breast, upper inner quadrant: Secondary | ICD-10-CM | POA: Diagnosis not present

## 2018-04-25 DIAGNOSIS — Z0000 Encounter for general adult medical examination without abnormal findings: Secondary | ICD-10-CM | POA: Diagnosis not present

## 2018-04-25 DIAGNOSIS — N182 Chronic kidney disease, stage 2 (mild): Secondary | ICD-10-CM | POA: Diagnosis not present

## 2018-04-25 DIAGNOSIS — M159 Polyosteoarthritis, unspecified: Secondary | ICD-10-CM

## 2018-04-25 DIAGNOSIS — E1141 Type 2 diabetes mellitus with diabetic mononeuropathy: Secondary | ICD-10-CM

## 2018-04-25 DIAGNOSIS — R928 Other abnormal and inconclusive findings on diagnostic imaging of breast: Secondary | ICD-10-CM | POA: Diagnosis not present

## 2018-04-25 MED ORDER — DULAGLUTIDE 0.75 MG/0.5ML ~~LOC~~ SOAJ: SUBCUTANEOUS | 2 refills | Status: DC

## 2018-04-25 MED ORDER — ROSUVASTATIN CALCIUM 5 MG PO TABS: ORAL_TABLET | ORAL | 0 refills | Status: DC

## 2018-04-25 MED ORDER — METFORMIN HCL 1000 MG PO TABS: ORAL_TABLET | ORAL | 1 refills | Status: DC

## 2018-04-25 MED ORDER — MOMETASONE FURO-FORMOTEROL FUM 100-5 MCG/ACT IN AERO: 2.0000 | INHALATION_SPRAY | Freq: Two times a day (BID) | RESPIRATORY_TRACT | 6 refills | Status: DC

## 2018-04-25 MED ORDER — GLIPIZIDE 10 MG PO TABS: 5.0000 mg | ORAL_TABLET | Freq: Two times a day (BID) | ORAL | 0 refills | Status: DC

## 2018-04-25 NOTE — Progress Notes (Signed)
Subjective:   Patient presents for Medicare Annual/Subsequent preventive examination.   Patient here to follow-up chronic medical problems and for wellness exam.  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  She is in cardiopulmonary rehab does not have any spells during that time.  Her recent A1c was 6.5% she is taking metformin and glipizide and Trulicity once a week.  She has not felt sick or had any recent illness.  She has diabetic neuropathy she is in need of new diabetic shoes.  She was also seen by the eye doctor recently  She continues to have chronic joint pain she was seen by orthopedics given a shot for bursitis in her hip which has helped but she states it still gives out on her she describes an episode where her hip gave out and she fell into a chair the help of her daughter before hitting the ground  Asthma she has not had any recent exacerbations once know she could come off of the [facility] sometimes she forgets to take a couple of doses and feels okay       Review Past Medical/Family/Social: per EMR    Risk Factors  Current exercise habits:  Dietary issues discussed:   Cardiac risk factors: Obesity (BMI >= 30 kg/m2).   Depression Screen  (Note: if answer to either of the following is "Yes", a more complete depression screening is indicated)  Over the past two weeks, have you felt down, depressed or hopeless? No Over the past two weeks, have you felt little interest or pleasure in doing things? No Have you lost interest or pleasure in daily life? No Do you often feel hopeless? No Do you cry easily over simple problems? No   Activities of Daily Living  In your present state of health, do you have any difficulty performing the following  activities?:  Driving? No  Managing money? No  Feeding yourself? No  Getting from bed to chair? No  Climbing a flight of stairs? Yes Preparing food and eating?: No  Bathing or showering? No  Getting dressed: No  Getting to the toilet? No  Using the toilet:No  Moving around from place to place: No  In the past year have you fallen or had a near fall?:Yes Are you sexually active? No  Do you have more than one partner? No   Hearing Difficulties: No  Do you often ask people to speak up or repeat themselves? No  Do you experience ringing or noises in your ears? No Do you have difficulty understanding soft or whispered voices? No  Do you feel that you have a problem with memory? No Do you often misplace items? No  Do you feel safe at home? Yes  Cognitive Testing  Alert? Yes Normal Appearance?Yes  Oriented to person? Yes Place? Yes  Time? Yes  Recall of three objects? Yes  Can perform simple calculations? Yes  Displays appropriate judgment?Yes  Can read the correct time from a watch face?Yes   List the Names of Other Physician/Practitioners you currently use:  Orthopedics, Cardiology, Eye Doctor   Screening Tests / Date Colonoscopy     DUE                Zostavax  - UTD Pneumonia- UTD  Mammogram - has today Influenza Vaccine  UTD Tetanus/tdap Declines  ROS: GEN- denies fatigue, fever, weight loss,weakness, recent illness HEENT- denies eye drainage, change in vision, nasal discharge, CVS- denies chest pain, palpitations RESP- denies SOB, cough, wheeze ABD- denies N/V, change in stools, abd pain GU- denies dysuria, hematuria, dribbling, incontinence MSK- denies joint pain, muscle aches, injury Neuro- denies headache, +dizziness, syncope, seizure activity  Physical: Vitals reviewed GEN- NAD, alert and oriented x3 HEENT- PERRL, EOMI, non injected sclera, pink conjunctiva, MMM, oropharynx clear Neck- Supple, no thryomegaly, no bruit, CVS- RRR, no  murmur RESP-CTAB ABD-NABS,soft,NT,ND,  Neuro-CNII-XII in tact no focal deficits  EXT-trace ankle edema Pulses- Radial, DP- 2+     Assessment:    Annual wellness medicare exam   Plan:    During the course of the visit the patient was educated and counseled about appropriate screening and preventive services including:   Pt declined info on advanced directives  Dizzy spells-focal deficits on exam her blood pressure looks good.   DM- A1C at6.5%.  With some of her dizzy-like spells noted her blood pressure is good and cardiac work-up has been done we will see if this is her blood sugar dropping.  I will decrease her glipizide down to 5 mg twice a day she will continue the metformin and Trulicity.  Diabetic shoes will be ordered once she sends me the forms.  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  Diet review for nutrition referral? Yes ____ Not Indicated __x__  Patient Instructions (the written plan) was given to the patient.  Medicare Attestation  I have personally reviewed:  The patient's medical and social history  Their use of alcohol, tobacco or illicit drugs  Their current medications and supplements  The patient's functional ability including ADLs,fall risks, home safety risks, cognitive, and hearing and visual impairment  Diet and physical activities  Evidence for depression or mood disorders  The patient's weight, height, BMI, and visual acuity have been recorded in the chart. I have made referrals, counseling, and provided education to the patient based on review of the above and I have provided the patient with a  written personalized care plan for preventive services.

## 2018-04-25 NOTE — Patient Instructions (Addendum)
decrease glipizide to 1/2 tablet twice a day  Continue current medications  Cologuard  Send me the info for the diabetic shoes  F/U 4 months

## 2018-04-26 LAB — MICROALBUMIN / CREATININE URINE RATIO
Creatinine, Urine: 65 mg/dL (ref 20–275)
Microalb Creat Ratio: 20 mcg/mg creat (ref <30)
Microalb, Ur: 1.3 mg/dL

## 2018-04-27 ENCOUNTER — Encounter: Payer: Self-pay | Admitting: *Deleted

## 2018-04-27 ENCOUNTER — Encounter (HOSPITAL_COMMUNITY)
Admission: RE | Admit: 2018-04-27 | Discharge: 2018-04-27 | Disposition: A | Discharge status: Home / Self Care | Payer: Medicare Other | Source: Ambulatory Visit | Attending: Internal Medicine | Admitting: Internal Medicine

## 2018-04-27 DIAGNOSIS — I5032 Chronic diastolic (congestive) heart failure: Secondary | ICD-10-CM | POA: Diagnosis not present

## 2018-04-27 NOTE — Progress Notes (Signed)
Pulmonary Individual Treatment Plan  Patient Details  Name: Julie Clay MRN: [id number] Date of Birth: 1940/08/22 Referring Provider:     PULMONARY REHAB OTHER RESP ORIENTATION from 03/15/2018 in [place]  Referring Provider  Caryl Comes      Initial Encounter Date:    PULMONARY REHAB OTHER RESP ORIENTATION from 03/15/2018 in [place]  Date  03/15/18      Visit Diagnosis: Chronic diastolic CHF (congestive heart failure) ([place])  Patient's Home Medications on Admission:   Current Outpatient Medications:  .  acetaminophen (TYLENOL) 500 MG tablet, Take 500 mg by mouth as needed for moderate pain. Takes 2 when needed, Disp: , Rfl:  .  albuterol (PROVENTIL HFA;VENTOLIN HFA) 108 (90 Base) MCG/ACT inhaler, Inhale 2 puffs into the lungs every 4 (four) hours as needed., Disp: 6.7 g, Rfl: 6 .  Aloe Vera Leaf POWD, Take 800 mg by mouth 2 (two) times daily. , Disp: , Rfl:  .  Ascorbic Acid (VITAMIN C) 1000 MG tablet, Take 1,000 mg by mouth 2 (two) times daily., Disp: , Rfl:  .  b complex vitamins tablet, Take 1 tablet by mouth 2 (two) times daily. , Disp: , Rfl:  .  Blood Glucose Monitoring Suppl (BLOOD GLUCOSE SYSTEM PAK) KIT, Use as directed to monitor FSBS 1x daily. Dx: E11.9. Please dispense as Accu-Chek Aviva, Disp: 1 each, Rfl: 1 .  Cholecalciferol (VITAMIN D3) 2000 UNITS TABS, Take 2,000 Units by mouth 2 (two) times daily., Disp: , Rfl:  .  Docusate Calcium (STOOL SOFTENER PO), Take 1 capsule by mouth as needed (constipation). , Disp: , Rfl:  .  Dulaglutide (TRULICITY) 7.74 FS/2.3TR SOPN, INJECT 1 PEN INTO THE SKIN ONCE EVERY WEEK for  diabetes, Disp: 2 mL, Rfl: 2 .  Echinacea 380 MG CAPS, Take 1 capsule by mouth as needed (cold/flue)., Disp: , Rfl:  .  fluocinonide cream (LIDEX) 3.20 %, Apply 1 application topically 2 (two) times daily as needed (poison ivy/bites/hives)., Disp: 30 g, Rfl: 11 .  Ginkgo Biloba 120 MG CAPS, Take 1 capsule by mouth  daily. , Disp: , Rfl:  .  glipiZIDE (GLUCOTROL) 10 MG tablet, Take 0.5 tablets (5 mg total) by mouth 2 (two) times daily before a meal. For diabetes, Disp: 180 tablet, Rfl: 0 .  Glucose Blood (BLOOD GLUCOSE TEST STRIPS) STRP, Use as directed to monitor FSBS 1x daily. Dx: E11.9. Please dispense as Accu-Chek Aviva, Disp: 100 each, Rfl: 1 .  KRILL OIL PO, Take 1 capsule by mouth 2 (two) times daily., Disp: , Rfl:  .  Lancets MISC, Use as directed to monitor FSBS 1x daily. Dx: E11.9. Please dispense as Accu-Chek Aviva, Disp: 100 each, Rfl: 1 .  Melatonin 3 MG TABS, Take 3 mg by mouth at bedtime as needed and may repeat dose one time if needed (sleep). , Disp: , Rfl:  .  metFORMIN (GLUCOPHAGE) 1000 MG tablet, TAKE 1 TABLET BY MOUTH TWICE DAILY WITH A MEAL for diabetes, Disp: 180 tablet, Rfl: 1 .  metoprolol succinate (TOPROL-XL) 25 MG 24 hr tablet, Take 12.5 mg by mouth daily., Disp: , Rfl:  .  mometasone-formoterol (DULERA) 100-5 MCG/ACT AERO, Inhale 2 puffs into the lungs 2 (two) times daily. For asthma, Disp: 1 Inhaler, Rfl: 6 .  Multiple Vitamin (MULTIVITAMIN WITH MINERALS) TABS tablet, Take 1 tablet by mouth daily., Disp: , Rfl:  .  NONFORMULARY OR COMPOUNDED ITEM, Apply 1 application topically 2 (two) times daily as needed. Reported on  06/10/2015, Disp: , Rfl:  .  rosuvastatin (CRESTOR) 5 MG tablet, TAKE 1 TABLET(5 MG) BY MOUTH DAILY for Cholesterol, Disp: 90 tablet, Rfl: 0 .  simethicone (MYLICON) 80 MG chewable tablet, Chew 80 mg by mouth as needed for flatulence. , Disp: , Rfl:  .  tiZANidine (ZANAFLEX) 4 MG tablet, Take 2 mg by mouth every 6 (six) hours as needed for muscle spasms., Disp: , Rfl:  .  torsemide (DEMADEX) 20 MG tablet, Take 10 mg by mouth daily., Disp: , Rfl:  .  triamcinolone cream (KENALOG) 0.1 %, Apply 1 application topically 2 (two) times daily., Disp: 30 g, Rfl: 0  Past Medical History: Past Medical History:  Diagnosis Date  . Asthma    spirometry (2011)- mild ventilary  defect  . Bone spur    "heels, knees, left shoulder" (06/22/2017)  . Breast cancer, left breast ([place]) dx'd 03/2011  . Chronic bronchitis ([place])   . COPD (chronic obstructive pulmonary disease) ([place])   . Fatty liver 2013   enlarged  . GERD (gastroesophageal reflux disease)   . HSV (herpes simplex virus) infection   . Hyperlipidemia   . Hypertension   . Lung nodule seen on imaging study 2013  . Lymphedema    Left arm  . 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 Osteoarthritis    "all over" (06/22/2017)  . Sleep apnea    Stop Bang score of 5. Pt has had sleep study, but was shown to be negative for sleep apnea.  . Type II diabetes mellitus ([place])   . Ventral hernia, unspecified, without mention of obstruction or gangrene     Tobacco Use: Social History   Tobacco Use  Smoking Status Never Smoker  Smokeless Tobacco Never Used    Labs: Recent Review Flowsheet Data    Labs for ITP Cardiac and Pulmonary Rehab Latest Ref Rng & Units 09/20/2016 03/14/2017 07/08/2017 11/09/2017 04/21/2018   Cholestrol <200 mg/dL 146 141 139 128 146   LDLCALC mg/dL (calc) 59 68 65 56 73   LDLDIRECT mg/dL - - - - -   HDL >50 mg/dL 44(L) 47(L) 50(L) 45(L) 48(L)   Trlycerides <150 mg/dL 214(H) 181(H) 159(H) 197(H) 169(H)   Hemoglobin A1c <5.7 % of total Hgb 7.2(H) 6.8(H) 6.5(H) 6.9(H) 6.5(H)      Capillary Blood Glucose: Lab Results  Component Value Date   GLUCAP 83 06/23/2017   GLUCAP 117 (H) 06/22/2017   GLUCAP 81 06/22/2017   GLUCAP 97 06/22/2017   GLUCAP 185 (H) 11/06/2013     Pulmonary Assessment Scores: Pulmonary Assessment Scores    Row Name 03/15/18 1437         ADL UCSD   ADL Phase  Entry     SOB Score total  69     Rest  0     Walk  11     Stairs  5     Bath  2     Dress  2     Shop  2       CAT Score   CAT Score  18       mMRC Score   mMRC Score  3        Pulmonary Function Assessment:   Exercise Target Goals: Exercise Program Goal: Individual exercise  prescription set using results from initial 6 min walk test and THRR while considering  patient's activity barriers and safety.   Exercise Prescription Goal: Initial exercise prescription builds  to 30-45 minutes a day of aerobic activity, 2-3 days per week.  Home exercise guidelines will be given to patient during program as part of exercise prescription that the participant will acknowledge.  Activity Barriers & Risk Stratification: Activity Barriers & Cardiac Risk Stratification - 03/15/18 1351      Activity Barriers & Cardiac Risk Stratification   Activity Barriers  Arthritis;Back Problems;Shortness of Breath;Deconditioning;Other (comment)    Comments  spurs in both heels and knees    Cardiac Risk Stratification  High       6 Minute Walk: 6 Minute Walk    Row Name 03/15/18 1350         6 Minute Walk   Phase  Initial     Distance  900 feet     Walk Time  6 minutes     # of Rest Breaks  2     MPH  1.7     METS  2.31     RPE  14     Perceived Dyspnea   14     VO2 Peak  5.49     Symptoms  Yes (comment)     Comments  SOB     Resting HR  93 bpm     Resting BP  122/68     Resting Oxygen Saturation   98 %     Exercise Oxygen Saturation  during 6 min walk  95 %     Max Ex. HR  110 bpm     Max Ex. BP  144/64     2 Minute Post BP  126/62        Oxygen Initial Assessment: Oxygen Initial Assessment - 03/15/18 1436      Home Oxygen   Home Oxygen Device  None    Sleep Oxygen Prescription  None    Home Exercise Oxygen Prescription  None    Home at Rest Exercise Oxygen Prescription  None      Initial 6 min Walk   Oxygen Used  None      Program Oxygen Prescription   Program Oxygen Prescription  None       Oxygen Re-Evaluation: Oxygen Re-Evaluation    Row Name 04/07/18 1313 04/27/18 1600           Program Oxygen Prescription   Program Oxygen Prescription  None  None        Home Oxygen   Home Oxygen Device  None  None      Sleep Oxygen Prescription  None  None       Home Exercise Oxygen Prescription  None  None      Home at Rest Exercise Oxygen Prescription  None  None        Goals/Expected Outcomes   Short Term Goals  To learn and understand importance of monitoring SPO2 with pulse oximeter and demonstrate accurate use of the pulse oximeter.;To learn and understand importance of maintaining oxygen saturations>88%;To learn and demonstrate proper pursed lip breathing techniques or other breathing techniques.  To learn and understand importance of monitoring SPO2 with pulse oximeter and demonstrate accurate use of the pulse oximeter.;To learn and understand importance of maintaining oxygen saturations>88%;To learn and demonstrate proper pursed lip breathing techniques or other breathing techniques.      Long  Term Goals  Verbalizes importance of monitoring SPO2 with pulse oximeter and return demonstration;Maintenance of O2 saturations>88%;Exhibits proper breathing techniques, such as pursed lip breathing or other method taught during program session  Verbalizes importance of  monitoring SPO2 with pulse oximeter and return demonstration;Maintenance of O2 saturations>88%;Exhibits proper breathing techniques, such as pursed lip breathing or other method taught during program session      Comments  -  Patient is able to use pulse oximeter properly with return demonstration and she is able to verbalize the importance of maintaining her O2Sat >88%.       Goals/Expected Outcomes  -  Patient will continue to meet her short and long term goals.          Oxygen Discharge (Final Oxygen Re-Evaluation): Oxygen Re-Evaluation - 04/27/18 1600      Program Oxygen Prescription   Program Oxygen Prescription  None      Home Oxygen   Home Oxygen Device  None    Sleep Oxygen Prescription  None    Home Exercise Oxygen Prescription  None    Home at Rest Exercise Oxygen Prescription  None      Goals/Expected Outcomes   Short Term Goals  To learn and understand importance of  monitoring SPO2 with pulse oximeter and demonstrate accurate use of the pulse oximeter.;To learn and understand importance of maintaining oxygen saturations>88%;To learn and demonstrate proper pursed lip breathing techniques or other breathing techniques.    Long  Term Goals  Verbalizes importance of monitoring SPO2 with pulse oximeter and return demonstration;Maintenance of O2 saturations>88%;Exhibits proper breathing techniques, such as pursed lip breathing or other method taught during program session    Comments  Patient is able to use pulse oximeter properly with return demonstration and she is able to verbalize the importance of maintaining her O2Sat >88%.     Goals/Expected Outcomes  Patient will continue to meet her short and long term goals.        Initial Exercise Prescription: Initial Exercise Prescription - 03/15/18 1400      Date of Initial Exercise RX and Referring Provider   Date  03/15/18    Referring Provider  Caryl Comes    Expected Discharge Date  06/15/18      NuStep   Level  1    SPM  60    Minutes  17    METs  1.7      Arm Ergometer   Level  1    Watts  11    RPM  50    Minutes  17    METs  1.8      Prescription Details   Frequency (times per week)  2    Duration  Progress to 30 minutes of continuous aerobic without signs/symptoms of physical distress      Intensity   THRR 40-80% of Max Heartrate  [phone]    Ratings of Perceived Exertion  11-13    Perceived Dyspnea  0-4      Progression   Progression  Continue to progress workloads to maintain intensity without signs/symptoms of physical distress.      Resistance Training   Training Prescription  Yes    Weight  1    Reps  10-15       Perform Capillary Blood Glucose checks as needed.  Exercise Prescription Changes:  Exercise Prescription Changes    Row Name 03/15/18 1400 04/06/18 1500 04/20/18 1200         Response to Exercise   Blood Pressure (Admit)  -  128/68  124/66     Blood Pressure  (Exercise)  -  146/60  140/60     Blood Pressure (Exit)  -  112/60  116/64  Heart Rate (Admit)  -  106 bpm  83 bpm     Heart Rate (Exercise)  -  104 bpm  102 bpm     Heart Rate (Exit)  -  98 bpm  101 bpm     Oxygen Saturation (Admit)  -  96 %  96 %     Oxygen Saturation (Exercise)  -  95 %  96 %     Oxygen Saturation (Exit)  -  98 %  96 %     Rating of Perceived Exertion (Exercise)  -  13  13     Perceived Dyspnea (Exercise)  -  14  14     Symptoms  -  R hip pain  -     Comments  -  first two weeks of exercise!   -     Duration  -  Progress to 30 minutes of  aerobic without signs/symptoms of physical distress  Continue with 30 min of aerobic exercise without signs/symptoms of physical distress.     Intensity  -  THRR New [phone]  THRR unchanged       Progression   Progression  -  Continue to progress workloads to maintain intensity without signs/symptoms of physical distress.  Continue to progress workloads to maintain intensity without signs/symptoms of physical distress.     Average METs  -  2  2       Resistance Training   Training Prescription  -  Yes  Yes     Weight  -  2  2     Reps  -  10-15  10-15       NuStep   Level  -  2  2     SPM  -  80  83     Minutes  -  17  17     METs  -  1.5  1.7       Arm Ergometer   Level  -  2  2     Watts  -  20  12     RPM  -  53  49     Minutes  -  22  22     METs  -  2.5  1.9       Home Exercise Plan   Plans to continue exercise at  Home (comment)  Home (comment)  Home (comment)     Frequency  Add 3 additional days to program exercise sessions.  Add 3 additional days to program exercise sessions.  Add 3 additional days to program exercise sessions.     Initial Home Exercises Provided  03/15/18  03/15/18  03/15/18        Exercise Comments:  Exercise Comments    Row Name 04/06/18 1516 04/27/18 0752         Exercise Comments  Pt. has done well in the program so far. She has tolerated the exercises and increases with  ease. Will continue to progress her as tolerated.   Pt. is doing well in PR. 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 She continues to come to class willing to work hard to improve her level of SOB with activity.          Exercise Goals and Review:  Exercise Goals    Row Name 03/15/18 1351             Exercise Goals   Increase  Physical Activity  Yes       Intervention  Provide advice, education, support and counseling about physical activity/exercise needs.;Develop an individualized exercise prescription for aerobic and resistive training based on initial evaluation findings, risk stratification, comorbidities and participant's personal goals.       Expected Outcomes  Short Term: Attend rehab on a regular basis to increase amount of physical activity.       Increase Strength and Stamina  Yes       Intervention  Provide advice, education, support and counseling about physical activity/exercise needs.;Develop an individualized exercise prescription for aerobic and resistive training based on initial evaluation findings, risk stratification, comorbidities and participant's personal goals.       Expected Outcomes  Short Term: Increase workloads from initial exercise prescription for resistance, speed, and METs.       Able to understand and use rate of perceived exertion (RPE) scale  Yes       Intervention  Provide education and explanation on how to use RPE scale       Expected Outcomes  Short Term: Able to use RPE daily in rehab to express subjective intensity level;Long Term:  Able to use RPE to guide intensity level when exercising independently       Able to understand and use Dyspnea scale  Yes       Intervention  Provide education and explanation on how to use Dyspnea scale       Expected Outcomes  Short Term: Able to use Dyspnea scale daily in rehab to express subjective sense of shortness of breath during exertion;Long Term: Able to use Dyspnea scale to guide  intensity level when exercising independently       Knowledge and understanding of Target Heart Rate Range (THRR)  Yes       Intervention  Provide education and explanation of THRR including how the numbers were predicted and where they are located for reference       Expected Outcomes  Short Term: Able to state/look up THRR       Able to check pulse independently  Yes       Intervention  Provide education and demonstration on how to check pulse in carotid and radial arteries.;Review the importance of being able to check your own pulse for safety during independent exercise       Expected Outcomes  Short Term: Able to explain why pulse checking is important during independent exercise;Long Term: Able to check pulse independently and accurately       Understanding of Exercise Prescription  Yes       Intervention  Provide education, explanation, and written materials on patient's individual exercise prescription       Expected Outcomes  Short Term: Able to explain program exercise prescription;Long Term: Able to explain home exercise prescription to exercise independently          Exercise Goals Re-Evaluation : Exercise Goals Re-Evaluation    Row Name 04/06/18 1514 04/27/18 0751           Exercise Goal Re-Evaluation   Exercise Goals Review  Increase Physical Activity;Increase Strength and Stamina;Able to understand and use Dyspnea scale;Able to understand and use rate of perceived exertion (RPE) scale;Knowledge and understanding of Target Heart Rate Range (THRR);Able to check pulse independently;Improve claudication pain tolerance and improve walking ability;Understanding of Exercise Prescription  Increase Physical Activity;Increase Strength and Stamina;Able to understand and use Dyspnea scale;Able to understand and use rate of perceived exertion (RPE) scale;Knowledge and understanding of  Target Heart Rate Range (THRR);Able to check pulse independently;Improve claudication pain tolerance and  improve walking ability;Understanding of Exercise Prescription      Comments  Pt. has attended 7 exercise sessions so far. She has tolerated the exercise well, other than some R hip pain that she feels started outside of rehab. We will continue to monitor and progress as we see fit.   Pt. continues to do well in the program. She works hard to push herself and increase her tolerance. We woll continue to monitor and progess as necessary.      Expected Outcomes  increase strength and stamina.   increase strength and stamina.          Discharge Exercise Prescription (Final Exercise Prescription Changes): Exercise Prescription Changes - 04/20/18 1200      Response to Exercise   Blood Pressure (Admit)  124/66    Blood Pressure (Exercise)  140/60    Blood Pressure (Exit)  116/64    Heart Rate (Admit)  83 bpm    Heart Rate (Exercise)  102 bpm    Heart Rate (Exit)  101 bpm    Oxygen Saturation (Admit)  96 %    Oxygen Saturation (Exercise)  96 %    Oxygen Saturation (Exit)  96 %    Rating of Perceived Exertion (Exercise)  13    Perceived Dyspnea (Exercise)  14    Duration  Continue with 30 min of aerobic exercise without signs/symptoms of physical distress.    Intensity  THRR unchanged      Progression   Progression  Continue to progress workloads to maintain intensity without signs/symptoms of physical distress.    Average METs  2      Resistance Training   Training Prescription  Yes    Weight  2    Reps  10-15      NuStep   Level  2    SPM  83    Minutes  17    METs  1.7      Arm Ergometer   Level  2    Watts  12    RPM  49    Minutes  22    METs  1.9      Home Exercise Plan   Plans to continue exercise at  Home (comment)    Frequency  Add 3 additional days to program exercise sessions.    Initial Home Exercises Provided  03/15/18       Nutrition:  Target Goals: Understanding of nutrition guidelines, daily intake of sodium <1565m, cholesterol <2060m calories 30% from  fat and 7% or less from saturated fats, daily to have 5 or more servings of fruits and vegetables.  Biometrics: Pre Biometrics - 03/15/18 1352      Pre Biometrics   Height  5' 5"  (1.651 m)    Weight  101.4 kg    Waist Circumference  44.5 inches    Hip Circumference  58.5 inches    Waist to Hip Ratio  0.76 %    BMI (Calculated)  37.2    Triceps Skinfold  29 mm    % Body Fat  47.9 %    Grip Strength  16.7 kg    Flexibility  0 in    Single Leg Stand  7.38 seconds        Nutrition Therapy Plan and Nutrition Goals: Nutrition Therapy & Goals - 04/27/18 1601      Nutrition Therapy   RD appointment deferred  Yes      Personal Nutrition Goals   Comments  Encouraged patient to attend next RD appointment in February. She says she trys to follow a diabetic diet.       Intervention Plan   Intervention  Nutrition handout(s) given to patient.       Nutrition Assessments: Nutrition Assessments - 03/15/18 1439      MEDFICTS Scores   Pre Score  53       Nutrition Goals Re-Evaluation:   Nutrition Goals Discharge (Final Nutrition Goals Re-Evaluation):   Psychosocial: Target Goals: Acknowledge presence or absence of significant depression and/or stress, maximize coping skills, provide positive support system. Participant is able to verbalize types and ability to use techniques and skills needed for reducing stress and depression.  Initial Review & Psychosocial Screening: Initial Psych Review & Screening - 03/15/18 1438      Initial Review   Current issues with  None Identified      Family Dynamics   Good Support System?  Yes      Barriers   Psychosocial barriers to participate in program  There are no identifiable barriers or psychosocial needs.      Screening Interventions   Interventions  Encouraged to exercise    Expected Outcomes  Short Term goal: Identification and review with participant of any Quality of Life or Depression concerns found by scoring the  questionnaire.;Long Term goal: The participant improves quality of Life and PHQ9 Scores as seen by post scores and/or verbalization of changes       Quality of Life Scores: Quality of Life - 03/15/18 1353      Quality of Life   Select  Quality of Life      Quality of Life Scores   Health/Function Pre  22.37 %    Socioeconomic Pre  23.33 %    Psych/Spiritual Pre  22.79 %    Family Pre  22.9 %    GLOBAL Pre  22.71 %      Scores of 19 and below usually indicate a poorer quality of life in these areas.  A difference of  2-3 points is a clinically meaningful difference.  A difference of 2-3 points in the total score of the Quality of Life Index has been associated with significant improvement in overall quality of life, self-image, physical symptoms, and general health in studies assessing change in quality of life.   PHQ-9: Recent Review Flowsheet Data    Depression screen [facility] 2/9 04/25/2018 03/15/2018 11/11/2017 07/07/2017 03/15/2017   Decreased Interest 0 0 0 0 0   Down, Depressed, Hopeless 0 0 0 0 0   PHQ - 2 Score 0 0 0 0 0   Altered sleeping - 0 - - 1   Tired, decreased energy - 3 - - 1   Change in appetite - - - - 0   Feeling bad or failure about yourself  - 0 - - 0   Trouble concentrating - 0 - - 0   Moving slowly or fidgety/restless - 0 - - 0   Suicidal thoughts - 0 - - 0   PHQ-9 Score - 3 - - 2   Difficult doing work/chores - Somewhat difficult - - Not difficult at all     Interpretation of Total Score  Total Score Depression Severity:  1-4 = Minimal depression, 5-9 = Mild depression, 10-14 = Moderate depression, 15-19 = Moderately severe depression, 20-27 = Severe depression   Psychosocial Evaluation and Intervention: Psychosocial Evaluation -  03/15/18 1439      Psychosocial Evaluation & Interventions   Interventions  Encouraged to exercise with the program and follow exercise prescription    Continue Psychosocial Services   No Follow up required        Psychosocial Re-Evaluation: Psychosocial Re-Evaluation    [place] Name 04/07/18 1320 04/27/18 1604           Psychosocial Re-Evaluation   Current issues with  None Identified  None Identified      Comments  Patient's initial QOL score was 22.71 and her PHQ-9 score was 3 with no psychosocial issues identified.   Patient's initial QOL score was 22.71 and her PHQ-9 score was 3 with no psychosocial issues identified.       Expected Outcomes  Patient will have no psychosocial issues identified at discharge.   Patient will have no psychosocial issues identified at discharge.       Interventions  Encouraged to attend Pulmonary Rehabilitation for the exercise;Relaxation education;Stress management education  Encouraged to attend Pulmonary Rehabilitation for the exercise;Relaxation education;Stress management education      Continue Psychosocial Services   No Follow up required  No Follow up required         Psychosocial Discharge (Final Psychosocial Re-Evaluation): Psychosocial Re-Evaluation - 04/27/18 1604      Psychosocial Re-Evaluation   Current issues with  None Identified    Comments  Patient's initial QOL score was 22.71 and her PHQ-9 score was 3 with no psychosocial issues identified.     Expected Outcomes  Patient will have no psychosocial issues identified at discharge.     Interventions  Encouraged to attend Pulmonary Rehabilitation for the exercise;Relaxation education;Stress management education    Continue Psychosocial Services   No Follow up required        Education: Education Goals: Education classes will be provided on a weekly basis, covering required topics. Participant will state understanding/return demonstration of topics presented.  Learning Barriers/Preferences: Learning Barriers/Preferences - 03/15/18 1440      Learning Barriers/Preferences   Learning Barriers  None    Learning Preferences  Audio;Computer/Internet;Group Instruction;Individual  Instruction;Pictoral;Skilled Demonstration;Verbal Instruction;Video;Written Material       Education Topics: How Lungs Work and Diseases: - Discuss the anatomy of the lungs and diseases that can affect the lungs, such as COPD.   Exercise: -Discuss the importance of exercise, FITT principles of exercise, normal and abnormal responses to exercise, and how to exercise safely.   Environmental Irritants: -Discuss types of environmental irritants and how to limit exposure to environmental irritants.   Meds/Inhalers and oxygen: - Discuss respiratory medications, definition of an inhaler and oxygen, and the proper way to use an inhaler and oxygen.   Energy Saving Techniques: - Discuss methods to conserve energy and decrease shortness of breath when performing activities of daily living.    Bronchial Hygiene / Breathing Techniques: - Discuss breathing mechanics, pursed-lip breathing technique,  proper posture, effective ways to clear airways, and other functional breathing techniques   PULMONARY REHAB OTHER RESPIRATORY from 04/27/2018 in [facility]  Date  03/23/18  Educator  Wynetta Emery  Instruction Review Code  2- Demonstrated Understanding      Cleaning Equipment: - Provides group verbal and written instruction about the health risks of elevated stress, cause of high stress, and healthy ways to reduce stress.   PULMONARY REHAB OTHER RESPIRATORY from 04/27/2018 in [facility]  Date  03/30/18  Educator  Wynetta Emery  Instruction Review Code  2- Demonstrated Understanding  Nutrition I: Fats: - Discuss the types of cholesterol, what cholesterol does to the body, and how cholesterol levels can be controlled.   PULMONARY REHAB OTHER RESPIRATORY from 04/27/2018 in [facility]  Date  04/06/18  Educator  Wynetta Emery  Instruction Review Code  2- Demonstrated Understanding      Nutrition II: Labels: -Discuss the different  components of food labels and how to read food labels.   PULMONARY REHAB OTHER RESPIRATORY from 04/27/2018 in [facility]  Date  04/20/18  Educator  DWynetta Emery  Instruction Review Code  2- Demonstrated Understanding      Respiratory Infections: - Discuss the signs and symptoms of respiratory infections, ways to prevent respiratory infections, and the importance of seeking medical treatment when having a respiratory infection.   PULMONARY REHAB OTHER RESPIRATORY from 04/27/2018 in [facility]  Date  04/27/18  Educator  Wynetta Emery  Instruction Review Code  2- Demonstrated Understanding      Stress I: Signs and Symptoms: - Discuss the causes of stress, how stress may lead to anxiety and depression, and ways to limit stress.   Stress II: Relaxation: -Discuss relaxation techniques to limit stress.   Oxygen for Home/Travel: - Discuss how to prepare for travel when on oxygen and proper ways to transport and store oxygen to ensure safety.   Knowledge Questionnaire Score: Knowledge Questionnaire Score - 03/15/18 1440      Knowledge Questionnaire Score   Pre Score  23/24       Core Components/Risk Factors/Patient Goals at Admission: Personal Goals and Risk Factors at Admission - 03/15/18 1440      Core Components/Risk Factors/Patient Goals on Admission    Weight Management  Weight Maintenance    Diabetes  Yes    Intervention  Provide education about signs/symptoms and action to take for hypo/hyperglycemia.;Provide education about proper nutrition, including hydration, and aerobic/resistive exercise prescription along with prescribed medications to achieve blood glucose in normal ranges: Fasting glucose 65-99 mg/dL    Expected Outcomes  Short Term: Participant verbalizes understanding of the signs/symptoms and immediate care of hyper/hypoglycemia, proper foot care and importance of medication, aerobic/resistive exercise and nutrition plan for  blood glucose control.;Long Term: Attainment of HbA1C < 7%.    Personal Goal Other  Yes    Personal Goal  Get more stamina, continue to gain strength and stamina    Intervention  Attend PR 2 x week and supplement with home exercise 3 x week.     Expected Outcomes  Reach personal goals of gaining stamina       Core Components/Risk Factors/Patient Goals Review:  Goals and Risk Factor Review    Row Name 04/07/18 1317 04/27/18 1602           Core Components/Risk Factors/Patient Goals Review   Personal Goals Review  Weight Management/Obesity;Improve shortness of breath with ADL's;Diabetes Increase strength and stamina.   Weight Management/Obesity;Improve shortness of breath with ADL's;Diabetes Increase strength and stamina.       Review  Patient has completed 7 sessions. She is doing well in the program with progression. Her last A1C on file was 11/09/17 at 6.9. Her fasting reported glucose readings are usually >200. She has advised on conpliance to a diabetic diet. She says she does not feel any stronger but the program has helped her "get moving". She hopes to meet her goals as she continues the program.   Patient has completed 10 sessions maintaining her weight since last 30 day review.  Her last A1C was in January at 6.5 mg/dl. Her fasting glucose readings are usually <150 mg/dl. She says she feels stonger and is moving more now doing more activities. 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 She is pleased with her progress in the program so far. Will continue to monitor for progress.       Expected Outcomes  Patient will continue to attend sessions and complete the program meeting her personal goals.   Patient will continue to attend sessions and complete the program meeting her personal goals.          Core Components/Risk Factors/Patient Goals at Discharge (Final Review):  Goals and Risk Factor Review - 04/27/18 1602      Core Components/Risk Factors/Patient Goals Review    Personal Goals Review  Weight Management/Obesity;Improve shortness of breath with ADL's;Diabetes   Increase strength and stamina.    Review  Patient has completed 10 sessions maintaining her weight since last 30 day review. Her last A1C was in January at 6.5 mg/dl. Her fasting glucose readings are usually <150 mg/dl. She says she feels stonger and is moving more now doing more activities. 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 She is pleased with her progress in the program so far. Will continue to monitor for progress.     Expected Outcomes  Patient will continue to attend sessions and complete the program meeting her personal goals.        ITP Comments: ITP Comments    Row Name 03/15/18 1435 03/20/18 1504         ITP Comments  Mrs. Brethauer is a pleasant 78 year old female that has CHF. 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   Patient is new to program. She plans to start 03/21/18. Will continue to monitor for progress.          Comments: ITP REVIEW Pt is making expected progress toward pulmonary rehab goals after completing 10 sessions. Recommend continued exercise, life style modification, education, and utilization of breathing techniques to increase stamina and strength and decrease shortness of breath with exertion.

## 2018-04-27 NOTE — Progress Notes (Signed)
Daily Session Note  Patient Details  Name: Julie Clay MRN: [id number] Date of Birth: 01/13/1941 Referring Provider:     PULMONARY REHAB OTHER RESP ORIENTATION from 03/15/2018 in [facility]  Referring Provider  Caryl Comes      Encounter Date: 04/27/2018  Check In: Session Check In - 04/27/18 1045      Check-In   Supervising physician immediately available to respond to emergencies  See telemetry face sheet for immediately available MD    Location  [facility]    Staff Present  Russella Dar, MS, EP, [facility], Exercise Physiologist;Amanda Ballard, Exercise Physiologist;Damione Robideau Wynetta Emery, RN, BSN    Medication changes reported      No    Fall or balance concerns reported     No    Warm-up and Cool-down  Performed as group-led instruction    Resistance Training Performed  Yes    VAD Patient?  No    PAD/SET Patient?  No      Pain Assessment   Currently in Pain?  No/denies    Pain Score  0-No pain    Multiple Pain Sites  No       Capillary Blood Glucose: No results found for this or any previous visit (from the past 24 hour(s)).    Social History   Tobacco Use  Smoking Status Never Smoker  Smokeless Tobacco Never Used    Goals Met:  Proper associated with RPD/PD & O2 Sat Independence with exercise equipment Improved SOB with ADL's Using PLB without cueing & demonstrates good technique Exercise tolerated well No report of cardiac concerns or symptoms Strength training completed today  Goals Unmet:  Not Applicable  Comments: Pt able to follow exercise prescription today without complaint.  Will continue to monitor for progression. Check out 1145.   Dr. Sinda Du is Medical Director for [facility] Pulmonary Rehab.

## 2018-04-28 ENCOUNTER — Other Ambulatory Visit: Payer: Self-pay | Admitting: *Deleted

## 2018-05-01 ENCOUNTER — Inpatient Hospital Stay (HOSPITAL_COMMUNITY): Payer: Medicare Other | Attending: Hematology | Admitting: Hematology

## 2018-05-01 ENCOUNTER — Encounter (HOSPITAL_COMMUNITY): Payer: Self-pay | Admitting: Hematology

## 2018-05-01 ENCOUNTER — Other Ambulatory Visit: Payer: Self-pay

## 2018-05-01 VITALS — BP 156/87 | HR 94 | Temp 98.8°F | Resp 18 | Ht 65.0 in | Wt 226.0 lb

## 2018-05-01 DIAGNOSIS — Z8249 Family history of ischemic heart disease and other diseases of the circulatory system: Secondary | ICD-10-CM | POA: Diagnosis not present

## 2018-05-01 DIAGNOSIS — Z950 Presence of cardiac pacemaker: Secondary | ICD-10-CM | POA: Diagnosis not present

## 2018-05-01 DIAGNOSIS — Z853 Personal history of malignant neoplasm of breast: Secondary | ICD-10-CM | POA: Insufficient documentation

## 2018-05-01 DIAGNOSIS — Z7984 Long term (current) use of oral hypoglycemic drugs: Secondary | ICD-10-CM | POA: Insufficient documentation

## 2018-05-01 DIAGNOSIS — Z79899 Other long term (current) drug therapy: Secondary | ICD-10-CM | POA: Insufficient documentation

## 2018-05-01 DIAGNOSIS — Z800 Family history of malignant neoplasm of digestive organs: Secondary | ICD-10-CM

## 2018-05-01 DIAGNOSIS — E119 Type 2 diabetes mellitus without complications: Secondary | ICD-10-CM | POA: Diagnosis not present

## 2018-05-01 DIAGNOSIS — C50912 Malignant neoplasm of unspecified site of left female breast: Secondary | ICD-10-CM

## 2018-05-01 NOTE — Progress Notes (Signed)
[address], [place] [address]   CLINIC:  Medical Oncology/Hematology  PCP:  Alycia Rossetti, MD [address] [place] [address] [phone]   REASON FOR VISIT: Follow-up for left breast cancer, and right breast abnormal findings.  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 female returns for routine follow-up for left breast cancer. She is here today and she is doing well. She reports SOB with exertion only. Otherwise she has no complaints. Denies any nausea, vomiting, or diarrhea. Denies any new pains. Had not noticed any recent bleeding such as epistaxis, hematuria or hematochezia. Denies recent chest pain on exertion, shortness of breath on minimal exertion, pre-syncopal episodes, or palpitations. Denies any numbness or tingling in hands or feet. Denies any recent fevers, infections, or recent hospitalizations. She reports her appetite at 100% and her energy level at 25%.     REVIEW OF SYSTEMS:  Review of Systems  Respiratory: Positive for shortness of breath.   Gastrointestinal: Positive for constipation.  All other systems reviewed and are negative.    PAST MEDICAL/SURGICAL HISTORY:  Past Medical History:  Diagnosis Date  . Asthma    spirometry (2011)- mild ventilary defect  . Bone spur    "heels, knees, left shoulder" (06/22/2017)  . Breast cancer, left breast ([place]) dx'd 03/2011  . Chronic bronchitis ([place])   . COPD (chronic obstructive pulmonary disease) ([place])    . Fatty liver 2013   enlarged  . GERD (gastroesophageal reflux disease)   . HSV (herpes simplex virus) infection   . Hyperlipidemia   . Hypertension   . Lung nodule seen on imaging study 2013  . Lymphedema    Left arm  . 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 Osteoarthritis    "all over" (06/22/2017)  . Sleep apnea    Stop Bang score of 5. Pt has had sleep study, but was shown to be negative for sleep apnea.  . Type II diabetes mellitus ([place])   . Ventral hernia, unspecified, without mention of obstruction or gangrene    Past Surgical History:  Procedure Laterality Date  . BREAST BIOPSY Left 04/2011  . BREAST BIOPSY Left 06/22/13  . BREAST BIOPSY Left 09/12/2013   Procedure: BREAST BIOPSY WITH NEEDLE LOCALIZATION;  Surgeon: Jamesetta So, MD;  Location: [facility];  Service: General;  Laterality: Left;  . BREAST LUMPECTOMY Left 04/2011 X 2  . CATARACT EXTRACTION W/ INTRAOCULAR LENS  IMPLANT, BILATERAL Bilateral   . CATARACT EXTRACTION, BILATERAL     on different occassions   . [place]  . DILATION AND CURETTAGE OF UTERUS  11/06/2013  . HAND LIGAMENT RECONSTRUCTION Right ~ 2010  . HYSTEROSCOPY W/D&C N/A 11/06/2013   Procedure: DILATATION AND CURETTAGE /HYSTEROSCOPY;  Surgeon: Jonnie Kind, MD;  Location: [facility];  Service: Gynecology;  Laterality: N/A;  . INSERT / REPLACE / REMOVE PACEMAKER  06/22/2017  . LUNG LOBECTOMY  Right 1988   Fungal Infection  . PACEMAKER IMPLANT N/A 06/22/2017   Procedure: PACEMAKER IMPLANT;  Surgeon: Deboraha Sprang, MD;  Location: [facility];  Service: Cardiovascular;  Laterality: N/A;  . POLYPECTOMY N/A 11/06/2013   Procedure: POLYPECTOMY (REMOVAL ENDOMETRIAL POLYP);  Surgeon: Jonnie Kind, MD;  Location: [facility];  Service: Gynecology;  Laterality: N/A;  . RECTOCELE REPAIR N/A 11/06/2013   Procedure: POSTERIOR REPAIR (RECTOCELE);  Surgeon: Jonnie Kind, MD;  Location: [facility];  Service: Gynecology;  Laterality: N/A;  .  SHOULDER ARTHROSCOPY WITH OPEN ROTATOR CUFF REPAIR Left 07/26/2012  . TONSILLECTOMY  1949  . TUBAL LIGATION       SOCIAL HISTORY:  Social History   Socioeconomic History  . Marital status: Married    Spouse name: Not on file  . Number of children: Not on file  . Years of education: Not on file  . Highest education level: Not on file  Occupational History  . Occupation: retired     Comment: Product manager for [employer]  Social Needs  . Financial resource strain: Not on file  . Food insecurity:    Worry: Not on file    Inability: Not on file  . Transportation needs:    Medical: Not on file    Non-medical: Not on file  Tobacco Use  . Smoking status: Never Smoker  . Smokeless tobacco: Never Used  Substance and Sexual Activity  . Alcohol use: Yes    Alcohol/week: 0.0 standard drinks    Comment: 06/22/2017 "might have a drink twice a year"  . Drug use: No  . Sexual activity: Not Currently    Birth control/protection: Post-menopausal  Lifestyle  . Physical activity:    Days per week: Not on file    Minutes per session: Not on file  . Stress: Not on file  Relationships  . Social connections:    Talks on phone: Not on file    Gets together: Not on file    Attends religious service: Not on file    Active member of club or organization: Not on file    Attends meetings of clubs or organizations: Not on file    Relationship status: Not on file  . Intimate partner violence:    Fear of current or ex partner: Not on file    Emotionally abused: Not on file    Physically abused: Not on file    Forced sexual activity: Not on file  Other Topics Concern  . Not on file  Social History Narrative  . Not on file    FAMILY HISTORY:  Family History  Problem Relation Age of Onset  . Hypertension Mother   . Heart disease Mother   . Diabetes Mother   . Kidney disease Mother        ESRD  . Cancer Mother        Bladder  . Hypertension Father   . Hyperlipidemia Father   .  Heart disease Father   . Diabetes Father   . Cancer Father        Stomach Cancer  . Diabetes Sister   . Heart disease Sister   . Alzheimer's disease Paternal Aunt   . Stroke Maternal Grandfather   . Cancer Paternal Grandfather        stomach  . Cancer Maternal Uncle        prostate  . Cancer Paternal Uncle        stomach  CURRENT MEDICATIONS:  Outpatient Encounter Medications as of 05/01/2018  Medication Sig  . acetaminophen (TYLENOL) 500 MG tablet Take 500 mg by mouth as needed for moderate pain. Takes 2 when needed  . albuterol (PROVENTIL HFA;VENTOLIN HFA) 108 (90 Base) MCG/ACT inhaler Inhale 2 puffs into the lungs every 4 (four) hours as needed.  Vanita Ingles Leaf POWD Take 800 mg by mouth 2 (two) times daily.   . Ascorbic Acid (VITAMIN C) 1000 MG tablet Take 1,000 mg by mouth 2 (two) times daily.  Marland Kitchen b complex vitamins tablet Take 1 tablet by mouth 2 (two) times daily.   . Blood Glucose Monitoring Suppl (BLOOD GLUCOSE SYSTEM PAK) KIT Use as directed to monitor FSBS 1x daily. Dx: E11.9. Please dispense as Accu-Chek Aviva  . Cholecalciferol (VITAMIN D3) 2000 UNITS TABS Take 2,000 Units by mouth 2 (two) times daily.  Mariane Baumgarten Calcium (STOOL SOFTENER PO) Take 1 capsule by mouth as needed (constipation).   . Dulaglutide (TRULICITY) 0.98 JX/9.1YN SOPN INJECT 1 PEN INTO THE SKIN ONCE EVERY WEEK for  diabetes  . Ginkgo Biloba 120 MG CAPS Take 1 capsule by mouth daily.   Marland Kitchen glipiZIDE (GLUCOTROL) 10 MG tablet Take 0.5 tablets (5 mg total) by mouth 2 (two) times daily before a meal. For diabetes  . Glucose Blood (BLOOD GLUCOSE TEST STRIPS) STRP Use as directed to monitor FSBS 1x daily. Dx: E11.9. Please dispense as Accu-Chek Aviva  . KRILL OIL PO Take 1 capsule by mouth 2 (two) times daily.  . Lancets MISC Use as directed to monitor FSBS 1x daily. Dx: E11.9. Please dispense as Accu-Chek Aviva  . Melatonin 3 MG TABS Take 3 mg by mouth at bedtime as needed and may repeat dose one time if  needed (sleep).   . metFORMIN (GLUCOPHAGE) 1000 MG tablet TAKE 1 TABLET BY MOUTH TWICE DAILY WITH A MEAL for diabetes  . metoprolol succinate (TOPROL-XL) 25 MG 24 hr tablet Take 12.5 mg by mouth daily.  . mometasone-formoterol (DULERA) 100-5 MCG/ACT AERO Inhale 2 puffs into the lungs 2 (two) times daily. For asthma  . Multiple Vitamin (MULTIVITAMIN WITH MINERALS) TABS tablet Take 1 tablet by mouth daily.  . NONFORMULARY OR COMPOUNDED ITEM Apply 1 application topically 2 (two) times daily as needed. Reported on 06/10/2015  . rosuvastatin (CRESTOR) 5 MG tablet TAKE 1 TABLET(5 MG) BY MOUTH DAILY for Cholesterol  . simethicone (MYLICON) 80 MG chewable tablet Chew 80 mg by mouth as needed for flatulence.   Marland Kitchen tiZANidine (ZANAFLEX) 4 MG tablet Take 2 mg by mouth every 6 (six) hours as needed for muscle spasms.  Marland Kitchen torsemide (DEMADEX) 20 MG tablet Take 10 mg by mouth daily.  . Echinacea 380 MG CAPS Take 1 capsule by mouth as needed (cold/flue).  . fluocinonide cream (LIDEX) 8.29 % Apply 1 application topically 2 (two) times daily as needed (poison ivy/bites/hives). (Patient not taking: Reported on 05/01/2018)  . triamcinolone cream (KENALOG) 0.1 % Apply 1 application topically 2 (two) times daily. (Patient not taking: Reported on 05/01/2018)   No facility-administered encounter medications on file as of 05/01/2018.     ALLERGIES:  Allergies  Allergen Reactions  . Arimidex [Anastrozole] Other (See Comments)    Arthralgias and myalgias.  Improved with 3 week hiatus from drug.  Categorical side effect of drug class.  . Adhesive [Tape] Other (See Comments)    "red and blistered"  . Lisinopril     Dry hacking cough and tickle  . Tetanus Toxoids   .  Tetracyclines & Related Other (See Comments)    unknown     PHYSICAL EXAM:  ECOG Performance status: 1  Vitals:   05/01/18 1400  BP: (!) 156/87  Pulse: 94  Resp: 18  Temp: 98.8 F (37.1 C)  SpO2: 97%   Filed Weights   05/01/18 1400  Weight: 226  lb (102.5 kg)    Physical Exam Constitutional:      Appearance: Normal appearance. She is normal weight.  Abdominal:     General: Abdomen is flat.     Palpations: Abdomen is soft.  Musculoskeletal: Normal range of motion.  Skin:    General: Skin is warm and dry.  Neurological:     Mental Status: She is alert and oriented to person, place, and time. Mental status is at baseline.  Psychiatric:        Mood and Affect: Mood normal.        Behavior: Behavior normal.        Thought Content: Thought content normal.        Judgment: Judgment normal.   Breast: No palpable masses, no skin changes or nipple discharge, no adenopathy.    LABORATORY DATA:  I have reviewed the labs as listed.  CBC    Component Value Date/Time   WBC 9.6 04/21/2018 0957   RBC 4.98 04/21/2018 0957   HGB 13.5 04/21/2018 0957   HCT 40.5 04/21/2018 0957   PLT 245 04/21/2018 0957   MCV 81.3 04/21/2018 0957   MCH 27.1 04/21/2018 0957   MCHC 33.3 04/21/2018 0957   RDW 13.6 04/21/2018 0957   LYMPHSABS 2,755 04/21/2018 0957   MONOABS 0.7 06/07/2017 1420   EOSABS 230 04/21/2018 0957   BASOSABS 29 04/21/2018 0957   CMP Latest Ref Rng & Units 04/21/2018 11/09/2017 10/10/2017  Glucose 65 - 99 mg/dL 86 109(H) 231(H)  BUN 7 - 25 mg/dL _0 Creatinine 0.60 - 0.93 mg/dL 0.81 0.91 0.94(H)  Sodium 135 - 146 mmol/L 143 143 144  Potassium 3.5 - 5.3 mmol/L 4.1 3.9 3.8  Chloride 98 - 110 mmol/L 106 106 105  CO2 20 - 32 mmol/L _1 Calcium 8.6 - 10.4 mg/dL 9.4 9.4 9.5  Total Protein 6.1 - 8.1 g/dL 6.3 6.2 -  Total Bilirubin 0.2 - 1.2 mg/dL 0.5 0.4 -  Alkaline Phos 33 - 130 U/L - - -  AST 10 - 35 U/L 17 19 -  ALT 6 - 29 U/L 14 12 -       DIAGNOSTIC IMAGING:  I have independently reviewed the scans and discussed with the patient.   I have reviewed Francene Finders, NP's note and agree with the documentation.  I personally performed a face-to-face visit, made revisions and my assessment and plan is as  follows.    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  Stage I left breast IDC, ER/PR positive, HER-2 negative: Oncotype DX score of 0, declined radiation therapy, Anastrozole from March 2013 through December 2013, discontinued secondary to arthralgias and myalgias, took tamoxifen for 4 years from January 2014 through January 2018. -Left breast ADH and ALH, status post lumpectomy on 09/12/2013 - Mammogram of the right breast on 02/08/2017 shows new finding, did not have biopsy done as recommended, missed mammogram appointment in April of this year secondary to pacemaker placement on the right side. -Mammogram and ultrasound of the right breast done on 10/04/2017 shows BI-RADS Category 4 with indeterminate hyperdense mass without  ultrasound correlate.  Stereotactic biopsy was recommended.  Patient declined. - I reviewed the results of the right breast mammogram dated 04/25/2018 which shows indeterminate mass in the upper inner quadrant of the right breast. -She declined biopsy again.  Physical exam today did not reveal any palpable masses.  No palpable adenopathy. -We will schedule her for bilateral diagnostic mammogram in June 2020. -She is currently taking vitamin D supplements.  She does not take calcium. -I will see her back in June after mammogram.  2.  Family history: Her dad, uncle and paternal grandfather had stomach cancers.      Orders placed this encounter:  Orders Placed This Encounter  Procedures  . MM Digital Diagnostic Bilat  . CBC with Differential/Platelet  . Comprehensive metabolic panel  . VITAMIN D 25 Hydroxy (Vit-D Deficiency, Fractures)  . Lactate dehydrogenase      Derek Jack, MD [place] [phone]

## 2018-05-01 NOTE — Assessment & Plan Note (Signed)
1.  Stage I left breast IDC, ER/PR positive, HER-2 negative: Oncotype DX score of 0, declined radiation therapy, Anastrozole from March 2013 through December 2013, discontinued secondary to arthralgias and myalgias, took tamoxifen for 4 years from January 2014 through January 2018. -Left breast ADH and ALH, status post lumpectomy on 09/12/2013 - Mammogram of the right breast on 02/08/2017 shows new finding, did not have biopsy done as recommended, missed mammogram appointment in April of this year secondary to pacemaker placement on the right side. -Mammogram and ultrasound of the right breast done on 10/04/2017 shows BI-RADS Category 4 with indeterminate hyperdense mass without ultrasound correlate.  Stereotactic biopsy was recommended.  Patient declined. - I reviewed the results of the right breast mammogram dated 04/25/2018 which shows indeterminate mass in the upper inner quadrant of the right breast. -She declined biopsy again.  Physical exam today did not reveal any palpable masses.  No palpable adenopathy. -We will schedule her for bilateral diagnostic mammogram in June 2020. -She is currently taking vitamin D supplements.  She does not take calcium. -I will see her back in June after mammogram.  2.  Family history: Her dad, uncle and paternal grandfather had stomach cancers.

## 2018-05-01 NOTE — Patient Instructions (Signed)
[facility] at [facility] Discharge Instructions     Thank you for choosing  Can[facility] Center at [facility] to provide your oncology and hematology care.  To afford each patient quality time with our provider, please arrive at least 15 minutes before your scheduled appointment time.   If you have a lab appointment with the Cancer Center please come in thru the  Main Entrance and check in at the main information desk  You need to re-schedule your appointment should you arrive 10 or more minutes late.  We strive to give you quality time with our providers, and arriving late affects you and other patients whose appointments are after yours.  Also, if you no show three or more times for appointments you may be dismissed from the clinic at the providers discretion.     Again, thank you for choosing [facility].  Our hope is that these requests will decrease the amount of time that you wait before being seen by our physicians.       _____________________________________________________________  Should you have questions after your visit to  Cancer Center, please contact our office at [phone] between the hours of 8:00 a.m. and 4:30 p.m.  Voicemails left after 4:00 p.m. will not be returned until the following business day.  For prescription refill requests, have your pharmacy contact our office and allow 72 hours.    Cancer Center Support Programs:   > Cancer Support Group  2nd Tuesday of the month 1pm-2pm, Journey Room    

## 2018-05-02 ENCOUNTER — Encounter: Payer: Self-pay | Admitting: *Deleted

## 2018-05-02 ENCOUNTER — Encounter (HOSPITAL_COMMUNITY)
Admission: RE | Admit: 2018-05-02 | Discharge: 2018-05-02 | Disposition: A | Discharge status: Home / Self Care | Payer: Medicare Other | Source: Ambulatory Visit | Attending: Internal Medicine | Admitting: Internal Medicine

## 2018-05-02 DIAGNOSIS — I5032 Chronic diastolic (congestive) heart failure: Secondary | ICD-10-CM | POA: Diagnosis not present

## 2018-05-02 NOTE — Progress Notes (Signed)
Daily Session Note  Patient Details  Name: Julie Clay MRN: [id number] Date of Birth: 1940-11-30 Referring Provider:     PULMONARY REHAB OTHER RESP ORIENTATION from 03/15/2018 in [place]  Referring Provider  Caryl Comes      Encounter Date: 05/02/2018  Check In: Session Check In - 05/02/18 1045      Check-In   Supervising physician immediately available to respond to emergencies  See telemetry face sheet for immediately available MD    Location  [facility]    Staff Present  Benay Pike, Exercise Physiologist;Minah Axelrod Wynetta Emery, RN, BSN    Medication changes reported      No    Fall or balance concerns reported     No    Warm-up and Cool-down  Performed as group-led instruction    Resistance Training Performed  Yes    VAD Patient?  No    PAD/SET Patient?  No      Pain Assessment   Currently in Pain?  No/denies    Pain Score  0-No pain    Multiple Pain Sites  No       Capillary Blood Glucose: No results found for this or any previous visit (from the past 24 hour(s)).    Social History   Tobacco Use  Smoking Status Never Smoker  Smokeless Tobacco Never Used    Goals Met:  Proper associated with RPD/PD & O2 Sat Independence with exercise equipment Improved SOB with ADL's Using PLB without cueing & demonstrates good technique Exercise tolerated well No report of cardiac concerns or symptoms Strength training completed today  Goals Unmet:  Not Applicable  Comments: Pt able to follow exercise prescription today without complaint.  Will continue to monitor for progression. Check out 1145.   Dr. Sinda Du is Medical Director for [facility] Pulmonary Re[facility].

## 2018-05-04 ENCOUNTER — Encounter (HOSPITAL_COMMUNITY)
Admission: RE | Admit: 2018-05-04 | Discharge: 2018-05-04 | Disposition: A | Discharge status: Home / Self Care | Payer: Medicare Other | Source: Ambulatory Visit | Attending: Internal Medicine | Admitting: Internal Medicine

## 2018-05-04 DIAGNOSIS — I5032 Chronic diastolic (congestive) heart failure: Secondary | ICD-10-CM | POA: Diagnosis not present

## 2018-05-04 NOTE — Progress Notes (Signed)
Daily Session Note  Patient Details  Name: CHASSITY LUDKE MRN: [id number] Date of Birth: [birth year] Referring Provider:     PULMONARY REHAB OTHER RESP ORIENTATION from 03/15/2018 in [place]  Referring Provider  Caryl Comes      Encounter Date: 05/04/2018  Check In: Session Check In - 05/04/18 1045      Check-In   Supervising physician immediately available to respond to emergencies  See telemetry face sheet for immediately available MD    Location  [facility]    Staff Present  Benay Pike, Exercise Physiologist;Other    Medication changes reported      No    Fall or balance concerns reported     No    Warm-up and Cool-down  Performed as group-led instruction    Resistance Training Performed  Yes    VAD Patient?  No    PAD/SET Patient?  No      Pain Assessment   Currently in Pain?  No/denies    Pain Score  0-No pain    Multiple Pain Sites  No       Capillary Blood Glucose: No results found for this or any previous visit (from the past 24 hour(s)).    Social History   Tobacco Use  Smoking Status Never Smoker  Smokeless Tobacco Never Used    Goals Met:  Proper associated with RPD/PD & O2 Sat Independence with exercise equipment Achieving weight loss Personal goals reviewed No report of cardiac concerns or symptoms Strength training completed today  Goals Unmet:  Not Applicable  Comments: Pt able to follow exercise prescription today without complaint.  Will continue to monitor for progression. Check out 1145.   Dr. Sinda Du is Medical Director for [facility] Pulmonary Rehab.

## 2018-05-09 ENCOUNTER — Encounter (HOSPITAL_COMMUNITY)
Admission: RE | Admit: 2018-05-09 | Discharge: 2018-05-09 | Disposition: A | Discharge status: Home / Self Care | Payer: Medicare Other | Source: Ambulatory Visit | Attending: Internal Medicine | Admitting: Internal Medicine

## 2018-05-09 DIAGNOSIS — I5032 Chronic diastolic (congestive) heart failure: Secondary | ICD-10-CM

## 2018-05-09 NOTE — Progress Notes (Signed)
Daily Session Note  Patient Details  Name: Julie Clay MRN: [id number] Date of Birth: [birth year] Referring Provider:     PULMONARY REHAB OTHER RESP ORIENTATION from 03/15/2018 in [place]  Referring Provider  Caryl Comes      Encounter Date: 05/09/2018  Check In: Session Check In - 05/09/18 1045      Check-In   Supervising physician immediately available to respond to emergencies  See telemetry face sheet for immediately available MD    Location  [facility]    Staff Present  Benay Pike, Exercise Physiologist;Diane Coad, MS, EP, [facility], Exercise Physiologist    Medication changes reported      No    Fall or balance concerns reported     No    Warm-up and Cool-down  Performed as group-led instruction    Resistance Training Performed  Yes    VAD Patient?  No    PAD/SET Patient?  No      Pain Assessment   Currently in Pain?  No/denies    Pain Score  0-No pain    Multiple Pain Sites  No       Capillary Blood Glucose: No results found for this or any previous visit (from the past 24 hour(s)).    Social History   Tobacco Use  Smoking Status Never Smoker  Smokeless Tobacco Never Used    Goals Met:  Proper associated with RPD/PD & O2 Sat Independence with exercise equipment Using PLB without cueing & demonstrates good technique Exercise tolerated well No report of cardiac concerns or symptoms Strength training completed today  Goals Unmet:  Not Applicable  Comments: Pt able to follow exercise prescription today without complaint.  Will continue to monitor for progression. Check out 1145.   Dr. Sinda Du is Medical Director for [facility] Pulmonary Rehab.

## 2018-05-11 ENCOUNTER — Encounter (HOSPITAL_COMMUNITY)
Admission: RE | Admit: 2018-05-11 | Discharge: 2018-05-11 | Disposition: A | Discharge status: Home / Self Care | Payer: Medicare Other | Source: Ambulatory Visit | Attending: Internal Medicine | Admitting: Internal Medicine

## 2018-05-11 DIAGNOSIS — I5032 Chronic diastolic (congestive) heart failure: Secondary | ICD-10-CM

## 2018-05-11 NOTE — Progress Notes (Signed)
Daily Session Note  Patient Details  Name: Julie Clay MRN: [id number] Date of Birth: [birth year] Referring Provider:     PULMONARY REHAB OTHER RESP ORIENTATION from 03/15/2018 in [place]  Referring Provider  Caryl Comes      Encounter Date: 05/11/2018  Check In: Session Check In - 05/11/18 1045      Check-In   Supervising physician immediately available to respond to emergencies  See telemetry face sheet for immediately available MD    Location  [facility]    Staff Present  Benay Pike, Exercise Physiologist;Diane Coad, MS, EP, [facility], Exercise Physiologist;Salma Walrond Wynetta Emery, RN, BSN    Medication changes reported      No    Fall or balance concerns reported     No    Warm-up and Cool-down  Performed as group-led instruction    Resistance Training Performed  Yes    VAD Patient?  No    PAD/SET Patient?  No      Pain Assessment   Currently in Pain?  No/denies    Pain Score  0-No pain    Multiple Pain Sites  No       Capillary Blood Glucose: No results found for this or any previous visit (from the past 24 hour(s)).    Social History   Tobacco Use  Smoking Status Never Smoker  Smokeless Tobacco Never Used    Goals Met:  Proper associated with RPD/PD & O2 Sat Independence with exercise equipment Improved SOB with ADL's Using PLB without cueing & demonstrates good technique Exercise tolerated well No report of cardiac concerns or symptoms Strength training completed today  Goals Unmet:  Not Applicable  Comments: Pt able to follow exercise prescription today without complaint.  Will continue to monitor for progression. Check out 1145.   Dr. Sinda Du is Medical Director for [facility] Pulmonary Rehab.

## 2018-05-13 LAB — CUP PACEART REMOTE DEVICE CHECK
Battery Remaining Longevity: 100 mo
Battery Remaining Percentage: 95.5 %
Brady Statistic AP VP Percent: 1 %
Brady Statistic AP VS Percent: 3.4 %
Brady Statistic AS VP Percent: 75 %
Brady Statistic AS VS Percent: 21 %
Brady Statistic RA Percent Paced: 4.1 %
Brady Statistic RV Percent Paced: 76 %
Date Time Interrogation Session: 20191209070012
Implantable Lead Implant Date: 20190306
Implantable Lead Implant Date: 20190306
Implantable Lead Location: 753859
Implantable Lead Location: 753860
Implantable Lead Model: 5076
Implantable Lead Model: 5076
Implantable Pulse Generator Implant Date: 20190306
Lead Channel Impedance Value: 350 Ohm
Lead Channel Impedance Value: 430 Ohm
Lead Channel Pacing Threshold Amplitude: 0.5 V
Lead Channel Pacing Threshold Pulse Width: 0.5 ms
Lead Channel Sensing Intrinsic Amplitude: 3.9 mV
Lead Channel Sensing Intrinsic Amplitude: 6.3 mV
Lead Channel Setting Pacing Amplitude: 2 V
Lead Channel Setting Pacing Amplitude: 2.5 V
Lead Channel Setting Pacing Pulse Width: 0.5 ms
Lead Channel Setting Sensing Sensitivity: 2 mV
MDC IDC MSMT BATTERY VOLTAGE: 3.01 V
MDC IDC MSMT LEADCHNL RA PACING THRESHOLD AMPLITUDE: 0.5 V
MDC IDC MSMT LEADCHNL RV PACING THRESHOLD PULSEWIDTH: 0.5 ms
Pulse Gen Model: 2272
Pulse Gen Serial Number: 9001027

## 2018-05-16 ENCOUNTER — Encounter (HOSPITAL_COMMUNITY)
Admission: RE | Admit: 2018-05-16 | Discharge: 2018-05-16 | Disposition: A | Discharge status: Home / Self Care | Payer: Medicare Other | Source: Ambulatory Visit | Attending: Internal Medicine | Admitting: Internal Medicine

## 2018-05-16 DIAGNOSIS — I5032 Chronic diastolic (congestive) heart failure: Secondary | ICD-10-CM | POA: Diagnosis not present

## 2018-05-16 NOTE — Progress Notes (Signed)
Daily Session Note  Patient Details  Name: Julie Clay MRN: [id number] Date of Birth: 1941-01-09 Referring Provider:     PULMONARY REHAB OTHER RESP ORIENTATION from 03/15/2018 in [place]  Referring Provider  Caryl Comes      Encounter Date: 05/16/2018  Check In: Session Check In - 05/16/18 1045      Check-In   Supervising physician immediately available to respond to emergencies  See telemetry face sheet for immediately available MD    Location  [facility]    Staff Present  Benay Pike, Exercise Physiologist;Diane Coad, MS, EP, [facility], Exercise Physiologist    Medication changes reported      No    Fall or balance concerns reported     No    Tobacco Cessation  No Change    Warm-up and Cool-down  Performed as group-led instruction    Resistance Training Performed  Yes    VAD Patient?  No    PAD/SET Patient?  No      Pain Assessment   Currently in Pain?  No/denies    Pain Score  0-No pain    Multiple Pain Sites  No       Capillary Blood Glucose: No results found for this or any previous visit (from the past 24 hour(s)).    Social History   Tobacco Use  Smoking Status Never Smoker  Smokeless Tobacco Never Used    Goals Met:  Proper associated with RPD/PD & O2 Sat Independence with exercise equipment Improved SOB with ADL's Using PLB without cueing & demonstrates good technique Exercise tolerated well No report of cardiac concerns or symptoms Strength training completed today  Goals Unmet:  Not Applicable  Comments: Pt able to follow exercise prescription today without complaint.  Will continue to monitor for progression. Check out 1145.   Dr. Sinda Du is Medical Director for [facility] Pulmonary Rehab.

## 2018-05-18 ENCOUNTER — Encounter (HOSPITAL_COMMUNITY)
Admission: RE | Admit: 2018-05-18 | Discharge: 2018-05-18 | Disposition: A | Discharge status: Home / Self Care | Payer: Medicare Other | Source: Ambulatory Visit | Attending: Internal Medicine | Admitting: Internal Medicine

## 2018-05-18 DIAGNOSIS — I5032 Chronic diastolic (congestive) heart failure: Secondary | ICD-10-CM | POA: Diagnosis not present

## 2018-05-18 NOTE — Progress Notes (Signed)
Daily Session Note  Patient Details  Name: Julie Clay MRN: [id number] Date of Birth: 1941-04-10 Referring Provider:     PULMONARY REHAB OTHER RESP ORIENTATION from 03/15/2018 in [place]  Referring Provider  Caryl Comes      Encounter Date: 05/18/2018  Check In: Session Check In - 05/18/18 1045      Check-In   Supervising physician immediately available to respond to emergencies  See telemetry face sheet for immediately available MD    Location  [facility]    Staff Present  Benay Pike, Exercise Physiologist;Debra Wynetta Emery, RN, BSN    Medication changes reported      No    Fall or balance concerns reported     No    Tobacco Cessation  No Change    Warm-up and Cool-down  Performed as group-led instruction    Resistance Training Performed  Yes    VAD Patient?  No    PAD/SET Patient?  No      Pain Assessment   Currently in Pain?  No/denies    Pain Score  0-No pain    Multiple Pain Sites  No       Capillary Blood Glucose: No results found for this or any previous visit (from the past 24 hour(s)).    Social History   Tobacco Use  Smoking Status Never Smoker  Smokeless Tobacco Never Used    Goals Met:  Proper associated with RPD/PD & O2 Sat Independence with exercise equipment Using PLB without cueing & demonstrates good technique Exercise tolerated well No report of cardiac concerns or symptoms Strength training completed today  Goals Unmet:  Not Applicable  Comments: Pt able to follow exercise prescription today without complaint.  Will continue to monitor for progression. Check out 1145.   Dr. Sinda Du is Medical Director for [facility] Pulmonary Rehab.

## 2018-05-22 ENCOUNTER — Other Ambulatory Visit: Payer: Self-pay

## 2018-05-22 DIAGNOSIS — Z0184 Encounter for antibody response examination: Secondary | ICD-10-CM | POA: Diagnosis not present

## 2018-05-22 DIAGNOSIS — Z111 Encounter for screening for respiratory tuberculosis: Secondary | ICD-10-CM | POA: Diagnosis not present

## 2018-05-22 DIAGNOSIS — Z1159 Encounter for screening for other viral diseases: Secondary | ICD-10-CM | POA: Diagnosis not present

## 2018-05-22 DIAGNOSIS — M170 Bilateral primary osteoarthritis of knee: Secondary | ICD-10-CM | POA: Diagnosis not present

## 2018-05-23 ENCOUNTER — Encounter (HOSPITAL_COMMUNITY)
Admission: RE | Admit: 2018-05-23 | Discharge: 2018-05-23 | Disposition: A | Discharge status: Home / Self Care | Payer: Medicare Other | Source: Ambulatory Visit | Attending: Internal Medicine | Admitting: Internal Medicine

## 2018-05-23 ENCOUNTER — Other Ambulatory Visit: Payer: Medicare Other

## 2018-05-23 DIAGNOSIS — Z111 Encounter for screening for respiratory tuberculosis: Secondary | ICD-10-CM | POA: Diagnosis not present

## 2018-05-23 DIAGNOSIS — Z0184 Encounter for antibody response examination: Secondary | ICD-10-CM | POA: Diagnosis not present

## 2018-05-23 DIAGNOSIS — I5032 Chronic diastolic (congestive) heart failure: Secondary | ICD-10-CM | POA: Insufficient documentation

## 2018-05-23 DIAGNOSIS — Z1159 Encounter for screening for other viral diseases: Secondary | ICD-10-CM | POA: Diagnosis not present

## 2018-05-23 NOTE — Progress Notes (Signed)
Daily Session Note  Patient Details  Name: KRISTA GODSIL MRN: [id number] Date of Birth: [birth year] Referring Provider:     PULMONARY REHAB OTHER RESP ORIENTATION from 03/15/2018 in [place]  Referring Provider  Caryl Comes      Encounter Date: 05/23/2018  Check In: Session Check In - 05/23/18 1045      Check-In   Supervising physician immediately available to respond to emergencies  See telemetry face sheet for immediately available MD    Location  [facility]    Staff Present  Benay Pike, Exercise Physiologist;Diane Coad, MS, EP, [facility], Exercise Physiologist    Medication changes reported      No    Fall or balance concerns reported     No    Tobacco Cessation  No Change    Warm-up and Cool-down  Performed as group-led instruction    Resistance Training Performed  Yes    VAD Patient?  No    PAD/SET Patient?  No      Pain Assessment   Currently in Pain?  No/denies    Pain Score  0-No pain    Multiple Pain Sites  No       Capillary Blood Glucose: No results found for this or any previous visit (from the past 24 hour(s)).    Social History   Tobacco Use  Smoking Status Never Smoker  Smokeless Tobacco Never Used    Goals Met:  Proper associated with RPD/PD & O2 Sat Independence with exercise equipment Improved SOB with ADL's Using PLB without cueing & demonstrates good technique Exercise tolerated well No report of cardiac concerns or symptoms Strength training completed today  Goals Unmet:  Not Applicable  Comments: Pt able to follow exercise prescription today without complaint.  Will continue to monitor for progression. Check out 1145.   Dr. Sinda Du is Medical Director for [facility] Pulmonary Rehab.

## 2018-05-24 LAB — HEPATITIS B SURFACE ANTIBODY, QUANTITATIVE: Hep B S AB Quant (Post): <5 m[IU]/mL — ABNORMAL LOW (ref >=10)

## 2018-05-25 ENCOUNTER — Telehealth: Payer: Self-pay | Admitting: *Deleted

## 2018-05-25 ENCOUNTER — Encounter: Payer: Self-pay | Admitting: *Deleted

## 2018-05-25 ENCOUNTER — Encounter (HOSPITAL_COMMUNITY)
Admission: RE | Admit: 2018-05-25 | Discharge: 2018-05-25 | Disposition: A | Discharge status: Home / Self Care | Payer: Medicare Other | Source: Ambulatory Visit | Attending: Internal Medicine | Admitting: Internal Medicine

## 2018-05-25 DIAGNOSIS — I5032 Chronic diastolic (congestive) heart failure: Secondary | ICD-10-CM | POA: Diagnosis not present

## 2018-05-25 LAB — QUANTIFERON-TB GOLD PLUS
Mitogen-NIL: >10 IU/mL
NIL: 0.02 IU/mL
QuantiFERON-TB Gold Plus: NEGATIVE
TB1-NIL: 0 IU/mL
TB2-NIL: 0 IU/mL

## 2018-05-25 NOTE — Telephone Encounter (Signed)
Received verbal orders for Cologuard.   Order placed via [employer].   Cologuard (Order [phone])

## 2018-05-25 NOTE — Progress Notes (Signed)
Pulmonary Individual Treatment Plan  Patient Details  Name: Julie Clay MRN: [id number] Date of Birth: 1940-11-12 Referring Provider:     PULMONARY REHAB OTHER RESP ORIENTATION from 03/15/2018 in [place]  Referring Provider  Caryl Comes      Initial Encounter Date:    PULMONARY REHAB OTHER RESP ORIENTATION from 03/15/2018 in [place]  Date  03/15/18      Visit Diagnosis: Chronic diastolic CHF (congestive heart failure) ([place])  Patient's Home Medications on Admission:   Current Outpatient Medications:  .  acetaminophen (TYLENOL) 500 MG tablet, Take 500 mg by mouth as needed for moderate pain. Takes 2 when needed, Disp: , Rfl:  .  albuterol (PROVENTIL HFA;VENTOLIN HFA) 108 (90 Base) MCG/ACT inhaler, Inhale 2 puffs into the lungs every 4 (four) hours as needed., Disp: 6.7 g, Rfl: 6 .  Aloe Vera Leaf POWD, Take 800 mg by mouth 2 (two) times daily. , Disp: , Rfl:  .  Ascorbic Acid (VITAMIN C) 1000 MG tablet, Take 1,000 mg by mouth 2 (two) times daily., Disp: , Rfl:  .  b complex vitamins tablet, Take 1 tablet by mouth 2 (two) times daily. , Disp: , Rfl:  .  Blood Glucose Monitoring Suppl (BLOOD GLUCOSE SYSTEM PAK) KIT, Use as directed to monitor FSBS 1x daily. Dx: E11.9. Please dispense as Accu-Chek Aviva, Disp: 1 each, Rfl: 1 .  Cholecalciferol (VITAMIN D3) 2000 UNITS TABS, Take 2,000 Units by mouth 2 (two) times daily., Disp: , Rfl:  .  Docusate Calcium (STOOL SOFTENER PO), Take 1 capsule by mouth as needed (constipation). , Disp: , Rfl:  .  Dulaglutide (TRULICITY) 7.06 CB/7.6EG SOPN, INJECT 1 PEN INTO THE SKIN ONCE EVERY WEEK for  diabetes, Disp: 2 mL, Rfl: 2 .  Echinacea 380 MG CAPS, Take 1 capsule by mouth as needed (cold/flue)., Disp: , Rfl:  .  fluocinonide cream (LIDEX) 3.15 %, Apply 1 application topically 2 (two) times daily as needed (poison ivy/bites/hives). (Patient not taking: Reported on 05/01/2018), Disp: 30 g, Rfl: 11 .  Ginkgo  Biloba 120 MG CAPS, Take 1 capsule by mouth daily. , Disp: , Rfl:  .  glipiZIDE (GLUCOTROL) 10 MG tablet, Take 0.5 tablets (5 mg total) by mouth 2 (two) times daily before a meal. For diabetes, Disp: 180 tablet, Rfl: 0 .  Glucose Blood (BLOOD GLUCOSE TEST STRIPS) STRP, Use as directed to monitor FSBS 1x daily. Dx: E11.9. Please dispense as Accu-Chek Aviva, Disp: 100 each, Rfl: 1 .  KRILL OIL PO, Take 1 capsule by mouth 2 (two) times daily., Disp: , Rfl:  .  Lancets MISC, Use as directed to monitor FSBS 1x daily. Dx: E11.9. Please dispense as Accu-Chek Aviva, Disp: 100 each, Rfl: 1 .  Melatonin 3 MG TABS, Take 3 mg by mouth at bedtime as needed and may repeat dose one time if needed (sleep). , Disp: , Rfl:  .  metFORMIN (GLUCOPHAGE) 1000 MG tablet, TAKE 1 TABLET BY MOUTH TWICE DAILY WITH A MEAL for diabetes, Disp: 180 tablet, Rfl: 1 .  metoprolol succinate (TOPROL-XL) 25 MG 24 hr tablet, Take 12.5 mg by mouth daily., Disp: , Rfl:  .  mometasone-formoterol (DULERA) 100-5 MCG/ACT AERO, Inhale 2 puffs into the lungs 2 (two) times daily. For asthma, Disp: 1 Inhaler, Rfl: 6 .  Multiple Vitamin (MULTIVITAMIN WITH MINERALS) TABS tablet, Take 1 tablet by mouth daily., Disp: , Rfl:  .  NONFORMULARY OR COMPOUNDED ITEM, Apply 1 application topically 2 (two)  times daily as needed. Reported on 06/10/2015, Disp: , Rfl:  .  rosuvastatin (CRESTOR) 5 MG tablet, TAKE 1 TABLET(5 MG) BY MOUTH DAILY for Cholesterol, Disp: 90 tablet, Rfl: 0 .  simethicone (MYLICON) 80 MG chewable tablet, Chew 80 mg by mouth as needed for flatulence. , Disp: , Rfl:  .  tiZANidine (ZANAFLEX) 4 MG tablet, Take 2 mg by mouth every 6 (six) hours as needed for muscle spasms., Disp: , Rfl:  .  torsemide (DEMADEX) 20 MG tablet, Take 10 mg by mouth daily., Disp: , Rfl:  .  triamcinolone cream (KENALOG) 0.1 %, Apply 1 application topically 2 (two) times daily. (Patient not taking: Reported on 05/01/2018), Disp: 30 g, Rfl: 0  Past Medical  History: Past Medical History:  Diagnosis Date  . Asthma    spirometry (2011)- mild ventilary defect  . Bone spur    "heels, knees, left shoulder" (06/22/2017)  . Breast cancer, left breast ([place]) dx'd 03/2011  . Chronic bronchitis ([place])   . COPD (chronic obstructive pulmonary disease) ([place])   . Fatty liver 2013   enlarged  . GERD (gastroesophageal reflux disease)   . HSV (herpes simplex virus) infection   . Hyperlipidemia   . Hypertension   . Lung nodule seen on imaging study 2013  . Lymphedema    Left arm  . 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 Osteoarthritis    "all over" (06/22/2017)  . Sleep apnea    Stop Bang score of 5. Pt has had sleep study, but was shown to be negative for sleep apnea.  . Type II diabetes mellitus ([place])   . Ventral hernia, unspecified, without mention of obstruction or gangrene     Tobacco Use: Social History   Tobacco Use  Smoking Status Never Smoker  Smokeless Tobacco Never Used    Labs: Recent Review Flowsheet Data    Labs for ITP Cardiac and Pulmonary Rehab Latest Ref Rng & Units 09/20/2016 03/14/2017 07/08/2017 11/09/2017 04/21/2018   Cholestrol <200 mg/dL 146 141 139 128 146   LDLCALC mg/dL (calc) 59 68 65 56 73   LDLDIRECT mg/dL - - - - -   HDL >50 mg/dL 44(L) 47(L) 50(L) 45(L) 48(L)   Trlycerides <150 mg/dL 214(H) 181(H) 159(H) 197(H) 169(H)   Hemoglobin A1c <5.7 % of total Hgb 7.2(H) 6.8(H) 6.5(H) 6.9(H) 6.5(H)      Capillary Blood Glucose: Lab Results  Component Value Date   GLUCAP 83 06/23/2017   GLUCAP 117 (H) 06/22/2017   GLUCAP 81 06/22/2017   GLUCAP 97 06/22/2017   GLUCAP 185 (H) 11/06/2013     Pulmonary Assessment Scores: Pulmonary Assessment Scores    Row Name 03/15/18 1437         ADL UCSD   ADL Phase  Entry     SOB Score total  69     Rest  0     Walk  11     Stairs  5     Bath  2     Dress  2     Shop  2       CAT Score   CAT Score  18       mMRC Score   mMRC Score  3        Pulmonary  Function Assessment:   Exercise Target Goals: Exercise Program Goal: Individual exercise prescription set using results from initial 6 min walk test and THRR while considering  patient's activity  barriers and safety.   Exercise Prescription Goal: Initial exercise prescription builds to 30-45 minutes a day of aerobic activity, 2-3 days per week.  Home exercise guidelines will be given to patient during program as part of exercise prescription that the participant will acknowledge.  Activity Barriers & Risk Stratification: Activity Barriers & Cardiac Risk Stratification - 03/15/18 1351      Activity Barriers & Cardiac Risk Stratification   Activity Barriers  Arthritis;Back Problems;Shortness of Breath;Deconditioning;Other (comment)    Comments  spurs in both heels and knees    Cardiac Risk Stratification  High       6 Minute Walk: 6 Minute Walk    Row Name 03/15/18 1350         6 Minute Walk   Phase  Initial     Distance  900 feet     Walk Time  6 minutes     # of Rest Breaks  2     MPH  1.7     METS  2.31     RPE  14     Perceived Dyspnea   14     VO2 Peak  5.49     Symptoms  Yes (comment)     Comments  SOB     Resting HR  93 bpm     Resting BP  122/68     Resting Oxygen Saturation   98 %     Exercise Oxygen Saturation  during 6 min walk  95 %     Max Ex. HR  110 bpm     Max Ex. BP  144/64     2 Minute Post BP  126/62        Oxygen Initial Assessment: Oxygen Initial Assessment - 03/15/18 1436      Home Oxygen   Home Oxygen Device  None    Sleep Oxygen Prescription  None    Home Exercise Oxygen Prescription  None    Home at Rest Exercise Oxygen Prescription  None      Initial 6 min Walk   Oxygen Used  None      Program Oxygen Prescription   Program Oxygen Prescription  None       Oxygen Re-Evaluation: Oxygen Re-Evaluation    Row Name 04/07/18 1313 04/27/18 1600 05/25/18 1407         Program Oxygen Prescription   Program Oxygen Prescription  None   None  None       Home Oxygen   Home Oxygen Device  None  None  None     Sleep Oxygen Prescription  None  None  None     Home Exercise Oxygen Prescription  None  None  None     Home at Rest Exercise Oxygen Prescription  None  None  None       Goals/Expected Outcomes   Short Term Goals  To learn and understand importance of monitoring SPO2 with pulse oximeter and demonstrate accurate use of the pulse oximeter.;To learn and understand importance of maintaining oxygen saturations>88%;To learn and demonstrate proper pursed lip breathing techniques or other breathing techniques.  To learn and understand importance of monitoring SPO2 with pulse oximeter and demonstrate accurate use of the pulse oximeter.;To learn and understand importance of maintaining oxygen saturations>88%;To learn and demonstrate proper pursed lip breathing techniques or other breathing techniques.  To learn and understand importance of monitoring SPO2 with pulse oximeter and demonstrate accurate use of the pulse oximeter.;To learn and understand importance of maintaining  oxygen saturations>88%;To learn and demonstrate proper pursed lip breathing techniques or other breathing techniques.     Long  Term Goals  Verbalizes importance of monitoring SPO2 with pulse oximeter and return demonstration;Maintenance of O2 saturations>88%;Exhibits proper breathing techniques, such as pursed lip breathing or other method taught during program session  Verbalizes importance of monitoring SPO2 with pulse oximeter and return demonstration;Maintenance of O2 saturations>88%;Exhibits proper breathing techniques, such as pursed lip breathing or other method taught during program session  Verbalizes importance of monitoring SPO2 with pulse oximeter and return demonstration;Maintenance of O2 saturations>88%;Exhibits proper breathing techniques, such as pursed lip breathing or other method taught during program session     Comments  -  Patient is able to use  pulse oximeter properly with return demonstration and she is able to verbalize the importance of maintaining her O2Sat >88%.   Patient is able to use pulse oximeter properly with return demonstration and she is able to verbalize the importance of maintaining her O2Sat >88%.      Goals/Expected Outcomes  -  Patient will continue to meet her short and long term goals.   Patient will continue to meet her short and long term goals.         Oxygen Discharge (Final Oxygen Re-Evaluation): Oxygen Re-Evaluation - 05/25/18 1407      Program Oxygen Prescription   Program Oxygen Prescription  None      Home Oxygen   Home Oxygen Device  None    Sleep Oxygen Prescription  None    Home Exercise Oxygen Prescription  None    Home at Rest Exercise Oxygen Prescription  None      Goals/Expected Outcomes   Short Term Goals  To learn and understand importance of monitoring SPO2 with pulse oximeter and demonstrate accurate use of the pulse oximeter.;To learn and understand importance of maintaining oxygen saturations>88%;To learn and demonstrate proper pursed lip breathing techniques or other breathing techniques.    Long  Term Goals  Verbalizes importance of monitoring SPO2 with pulse oximeter and return demonstration;Maintenance of O2 saturations>88%;Exhibits proper breathing techniques, such as pursed lip breathing or other method taught during program session    Comments  Patient is able to use pulse oximeter properly with return demonstration and she is able to verbalize the importance of maintaining her O2Sat >88%.     Goals/Expected Outcomes  Patient will continue to meet her short and long term goals.        Initial Exercise Prescription: Initial Exercise Prescription - 03/15/18 1400      Date of Initial Exercise RX and Referring Provider   Date  03/15/18    Referring Provider  Caryl Comes    Expected Discharge Date  06/15/18      NuStep   Level  1    SPM  60    Minutes  17    METs  1.7      Arm  Ergometer   Level  1    Watts  11    RPM  50    Minutes  17    METs  1.8      Prescription Details   Frequency (times per week)  2    Duration  Progress to 30 minutes of continuous aerobic without signs/symptoms of physical distress      Intensity   THRR 40-80% of Max Heartrate  [phone]    Ratings of Perceived Exertion  11-13    Perceived Dyspnea  0-4      Progression  Progression  Continue to progress workloads to maintain intensity without signs/symptoms of physical distress.      Resistance Training   Training Prescription  Yes    Weight  1    Reps  10-15       Perform Capillary Blood Glucose checks as needed.  Exercise Prescription Changes: Exercise Prescription Changes    Row Name 03/15/18 1400 04/06/18 1500 04/20/18 1200 05/03/18 1500 05/18/18 0700     Response to Exercise   Blood Pressure (Admit)  -  128/68  124/66  124/62  118/62   Blood Pressure (Exercise)  -  146/60  140/60  130/60  128/62   Blood Pressure (Exit)  -  112/60  116/64  118/68  122/62   Heart Rate (Admit)  -  106 bpm  83 bpm  78 bpm  90 bpm   Heart Rate (Exercise)  -  104 bpm  102 bpm  96 bpm  93 bpm   Heart Rate (Exit)  -  98 bpm  101 bpm  90 bpm  75 bpm   Oxygen Saturation (Admit)  -  96 %  96 %  97 %  97 %   Oxygen Saturation (Exercise)  -  95 %  96 %  95 %  96 %   Oxygen Saturation (Exit)  -  98 %  96 %  97 %  97 %   Rating of Perceived Exertion (Exercise)  -  _0 Perceived Dyspnea (Exercise)  -  _1 Symptoms  -  R hip pain  -  -  -   Comments  -  first two weeks of exercise!   -  -  -   Duration  -  Progress to 30 minutes of  aerobic without signs/symptoms of physical distress  Continue with 30 min of aerobic exercise without signs/symptoms of physical distress.  Continue with 30 min of aerobic exercise without signs/symptoms of physical distress.  Continue with 30 min of aerobic exercise without signs/symptoms of physical distress.   Intensity  -  THRR New  [phone]  THRR unchanged  THRR unchanged  THRR unchanged     Progression   Progression  -  Continue to progress workloads to maintain intensity without signs/symptoms of physical distress.  Continue to progress workloads to maintain intensity without signs/symptoms of physical distress.  Continue to progress workloads to maintain intensity without signs/symptoms of physical distress.  Continue to progress workloads to maintain intensity without signs/symptoms of physical distress.   Average METs  -  _2 2.1     Resistance Training   Training Prescription  -  Yes  Yes  Yes  Yes   Weight  -  _3 Reps  -  10-15  10-15  10-15  10-15     NuStep   Level  -  _4 SPM  -  80  83  81  83   Minutes  -  _5 METs  -  1.5  1.7  1.7  1.8     Arm Ergometer   Level  -  _6 Watts  -  _7 RPM  -  53  49  51  46   Minutes  -  _0 METs  -  2.5  1.9  2.3  2.4     Home Exercise Plan   Plans to continue exercise at  Home (comment)  Home (comment)  Home (comment)  Home (comment)  Home (comment)   Frequency  Add 3 additional days to program exercise sessions.  Add 3 additional days to program exercise sessions.  Add 3 additional days to program exercise sessions.  Add 3 additional days to program exercise sessions.  Add 3 additional days to program exercise sessions.   Initial Home Exercises Provided  03/15/18  03/15/18  03/15/18  03/15/18  03/15/18      Exercise Comments: Exercise Comments    Row Name 04/06/18 1516 04/27/18 0752 05/23/18 1204       Exercise Comments  Pt. has done well in the program so far. She has tolerated the exercises and increases with ease. Will continue to progress her as tolerated.   Pt. is doing well in PR. 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 She continues to come to class willing to work hard to improve her level of SOB with activity.   Pt. is now workin gon level 3 on both  the Nustep and Arm Ergometer. She now has a goal to increase her distance each session. She feels as if she is able to do more outside of rehab with less SOB.         Exercise Goals and Review: Exercise Goals    Row Name 03/15/18 1351             Exercise Goals   Increase Physical Activity  Yes       Intervention  Provide advice, education, support and counseling about physical activity/exercise needs.;Develop an individualized exercise prescription for aerobic and resistive training based on initial evaluation findings, risk stratification, comorbidities and participant's personal goals.       Expected Outcomes  Short Term: Attend rehab on a regular basis to increase amount of physical activity.       Increase Strength and Stamina  Yes       Intervention  Provide advice, education, support and counseling about physical activity/exercise needs.;Develop an individualized exercise prescription for aerobic and resistive training based on initial evaluation findings, risk stratification, comorbidities and participant's personal goals.       Expected Outcomes  Short Term: Increase workloads from initial exercise prescription for resistance, speed, and METs.       Able to understand and use rate of perceived exertion (RPE) scale  Yes       Intervention  Provide education and explanation on how to use RPE scale       Expected Outcomes  Short Term: Able to use RPE daily in rehab to express subjective intensity level;Long Term:  Able to use RPE to guide intensity level when exercising independently       Able to understand and use Dyspnea scale  Yes       Intervention  Provide education and explanation on how to use Dyspnea scale       Expected Outcomes  Short Term: Able to use Dyspnea scale daily in rehab to express subjective sense of shortness of breath during exertion;Long Term: Able to use Dyspnea scale to guide intensity level when exercising independently       Knowledge and understanding of  Target  Heart Rate Range (THRR)  Yes       Intervention  Provide education and explanation of THRR including how the numbers were predicted and where they are located for reference       Expected Outcomes  Short Term: Able to state/look up THRR       Able to check pulse independently  Yes       Intervention  Provide education and demonstration on how to check pulse in carotid and radial arteries.;Review the importance of being able to check your own pulse for safety during independent exercise       Expected Outcomes  Short Term: Able to explain why pulse checking is important during independent exercise;Long Term: Able to check pulse independently and accurately       Understanding of Exercise Prescription  Yes       Intervention  Provide education, explanation, and written materials on patient's individual exercise prescription       Expected Outcomes  Short Term: Able to explain program exercise prescription;Long Term: Able to explain home exercise prescription to exercise independently          Exercise Goals Re-Evaluation : Exercise Goals Re-Evaluation    Row Name 04/06/18 1514 04/27/18 0751 05/23/18 1202         Exercise Goal Re-Evaluation   Exercise Goals Review  Increase Physical Activity;Increase Strength and Stamina;Able to understand and use Dyspnea scale;Able to understand and use rate of perceived exertion (RPE) scale;Knowledge and understanding of Target Heart Rate Range (THRR);Able to check pulse independently;Improve claudication pain tolerance and improve walking ability;Understanding of Exercise Prescription  Increase Physical Activity;Increase Strength and Stamina;Able to understand and use Dyspnea scale;Able to understand and use rate of perceived exertion (RPE) scale;Knowledge and understanding of Target Heart Rate Range (THRR);Able to check pulse independently;Improve claudication pain tolerance and improve walking ability;Understanding of Exercise Prescription  Increase  Physical Activity;Increase Strength and Stamina;Able to understand and use Dyspnea scale;Able to understand and use rate of perceived exertion (RPE) scale;Knowledge and understanding of Target Heart Rate Range (THRR);Able to check pulse independently;Improve claudication pain tolerance and improve walking ability;Understanding of Exercise Prescription     Comments  Pt. has attended 7 exercise sessions so far. She has tolerated the exercise well, other than some R hip pain that she feels started outside of rehab. We will continue to monitor and progress as we see fit.   Pt. continues to do well in the program. She works hard to push herself and increase her tolerance. We woll continue to monitor and progess as necessary.  Pt. has done well in PR. She has attended 16 session so far. We have talked about her pushing herself to work harder in order to increase her stamina for when she gets out of rehab.      Expected Outcomes  increase strength and stamina.   increase strength and stamina.   increase strength and stamina.         Discharge Exercise Prescription (Final Exercise Prescription Changes): Exercise Prescription Changes - 05/18/18 0700      Response to Exercise   Blood Pressure (Admit)  118/62    Blood Pressure (Exercise)  128/62    Blood Pressure (Exit)  122/62    Heart Rate (Admit)  90 bpm    Heart Rate (Exercise)  93 bpm    Heart Rate (Exit)  75 bpm    Oxygen Saturation (Admit)  97 %    Oxygen Saturation (Exercise)  96 %  Oxygen Saturation (Exit)  97 %    Rating of Perceived Exertion (Exercise)  14    Perceived Dyspnea (Exercise)  13    Duration  Continue with 30 min of aerobic exercise without signs/symptoms of physical distress.    Intensity  THRR unchanged      Progression   Progression  Continue to progress workloads to maintain intensity without signs/symptoms of physical distress.    Average METs  2.1      Resistance Training   Training Prescription  Yes    Weight  2     Reps  10-15      NuStep   Level  2    SPM  83    Minutes  17    METs  1.8      Arm Ergometer   Level  3    Watts  18    RPM  46    Minutes  22    METs  2.4      Home Exercise Plan   Plans to continue exercise at  Home (comment)    Frequency  Add 3 additional days to program exercise sessions.    Initial Home Exercises Provided  03/15/18       Nutrition:  Target Goals: Understanding of nutrition guidelines, daily intake of sodium <1523m, cholesterol <2063m calories 30% from fat and 7% or less from saturated fats, daily to have 5 or more servings of fruits and vegetables.  Biometrics: Pre Biometrics - 03/15/18 1352      Pre Biometrics   Height  5' 5" (1.651 m)    Weight  101.4 kg    Waist Circumference  44.5 inches    Hip Circumference  58.5 inches    Waist to Hip Ratio  0.76 %    BMI (Calculated)  37.2    Triceps Skinfold  29 mm    % Body Fat  47.9 %    Grip Strength  16.7 kg    Flexibility  0 in    Single Leg Stand  7.38 seconds        Nutrition Therapy Plan and Nutrition Goals: Nutrition Therapy & Goals - 05/25/18 1407      Nutrition Therapy   RD appointment deferred  Yes      Personal Nutrition Goals   Comments  Patient did not meet with RD in February. She states she is eating more salads and fruits. She also continues to follow a diabetic diet.       Intervention Plan   Intervention  Nutrition handout(s) given to patient.       Nutrition Assessments: Nutrition Assessments - 03/15/18 1439      MEDFICTS Scores   Pre Score  53       Nutrition Goals Re-Evaluation:   Nutrition Goals Discharge (Final Nutrition Goals Re-Evaluation):   Psychosocial: Target Goals: Acknowledge presence or absence of significant depression and/or stress, maximize coping skills, provide positive support system. Participant is able to verbalize types and ability to use techniques and skills needed for reducing stress and depression.  Initial Review & Psychosocial  Screening: Initial Psych Review & Screening - 03/15/18 1438      Initial Review   Current issues with  None Identified      Family Dynamics   Good Support System?  Yes      Barriers   Psychosocial barriers to participate in program  There are no identifiable barriers or psychosocial needs.      Screening Interventions  Interventions  Encouraged to exercise    Expected Outcomes  Short Term goal: Identification and review with participant of any Quality of Life or Depression concerns found by scoring the questionnaire.;Long Term goal: The participant improves quality of Life and PHQ9 Scores as seen by post scores and/or verbalization of changes       Quality of Life Scores: Quality of Life - 03/15/18 1353      Quality of Life   Select  Quality of Life      Quality of Life Scores   Health/Function Pre  22.37 %    Socioeconomic Pre  23.33 %    Psych/Spiritual Pre  22.79 %    Family Pre  22.9 %    GLOBAL Pre  22.71 %      Scores of 19 and below usually indicate a poorer quality of life in these areas.  A difference of  2-3 points is a clinically meaningful difference.  A difference of 2-3 points in the total score of the Quality of Life Index has been associated with significant improvement in overall quality of life, self-image, physical symptoms, and general health in studies assessing change in quality of life.   PHQ-9: Recent Review Flowsheet Data    Depression screen [facility] 2/9 04/25/2018 03/15/2018 11/11/2017 07/07/2017 03/15/2017   Decreased Interest 0 0 0 0 0   Down, Depressed, Hopeless 0 0 0 0 0   PHQ - 2 Score 0 0 0 0 0   Altered sleeping - 0 - - 1   Tired, decreased energy - 3 - - 1   Change in appetite - - - - 0   Feeling bad or failure about yourself  - 0 - - 0   Trouble concentrating - 0 - - 0   Moving slowly or fidgety/restless - 0 - - 0   Suicidal thoughts - 0 - - 0   PHQ-9 Score - 3 - - 2   Difficult doing work/chores - Somewhat difficult - - Not difficult at  all     Interpretation of Total Score  Total Score Depression Severity:  1-4 = Minimal depression, 5-9 = Mild depression, 10-14 = Moderate depression, 15-19 = Moderately severe depression, 20-27 = Severe depression   Psychosocial Evaluation and Intervention: Psychosocial Evaluation - 03/15/18 1439      Psychosocial Evaluation & Interventions   Interventions  Encouraged to exercise with the program and follow exercise prescription    Continue Psychosocial Services   No Follow up required       Psychosocial Re-Evaluation: Psychosocial Re-Evaluation    Row Name 04/07/18 1320 04/27/18 1604 05/25/18 1414         Psychosocial Re-Evaluation   Current issues with  None Identified  None Identified  None Identified     Comments  Patient's initial QOL score was 22.71 and her PHQ-9 score was 3 with no psychosocial issues identified.   Patient's initial QOL score was 22.71 and her PHQ-9 score was 3 with no psychosocial issues identified.   Patient's initial QOL score was 22.71 and her PHQ-9 score was 3 with no psychosocial issues identified.      Expected Outcomes  Patient will have no psychosocial issues identified at discharge.   Patient will have no psychosocial issues identified at discharge.   Patient will have no psychosocial issues identified at discharge.      Interventions  Encouraged to attend Pulmonary Rehabilitation for the exercise;Relaxation education;Stress management education  Encouraged to attend Pulmonary Rehabilitation for  the exercise;Relaxation education;Stress management education  Encouraged to attend Pulmonary Rehabilitation for the exercise;Relaxation education;Stress management education     Continue Psychosocial Services   No Follow up required  No Follow up required  No Follow up required        Psychosocial Discharge (Final Psychosocial Re-Evaluation): Psychosocial Re-Evaluation - 05/25/18 1414      Psychosocial Re-Evaluation   Current issues with  None Identified     Comments  Patient's initial QOL score was 22.71 and her PHQ-9 score was 3 with no psychosocial issues identified.     Expected Outcomes  Patient will have no psychosocial issues identified at discharge.     Interventions  Encouraged to attend Pulmonary Rehabilitation for the exercise;Relaxation education;Stress management education    Continue Psychosocial Services   No Follow up required        Education: Education Goals: Education classes will be provided on a weekly basis, covering required topics. Participant will state understanding/return demonstration of topics presented.  Learning Barriers/Preferences: Learning Barriers/Preferences - 03/15/18 1440      Learning Barriers/Preferences   Learning Barriers  None    Learning Preferences  Audio;Computer/Internet;Group Instruction;Individual Instruction;Pictoral;Skilled Demonstration;Verbal Instruction;Video;Written Material       Education Topics: How Lungs Work and Diseases: - Discuss the anatomy of the lungs and diseases that can affect the lungs, such as COPD.   PULMONARY REHAB OTHER RESPIRATORY from 05/25/2018 in [place]  Date  05/25/18  Educator  Wynetta Emery  Instruction Review Code  2- Demonstrated Understanding      Exercise: -Discuss the importance of exercise, FITT principles of exercise, normal and abnormal responses to exercise, and how to exercise safely.   Environmental Irritants: -Discuss types of environmental irritants and how to limit exposure to environmental irritants.   Meds/Inhalers and oxygen: - Discuss respiratory medications, definition of an inhaler and oxygen, and the proper way to use an inhaler and oxygen.   Energy Saving Techniques: - Discuss methods to conserve energy and decrease shortness of breath when performing activities of daily living.    Bronchial Hygiene / Breathing Techniques: - Discuss breathing mechanics, pursed-lip breathing technique,  proper posture,  effective ways to clear airways, and other functional breathing techniques   PULMONARY REHAB OTHER RESPIRATORY from 05/25/2018 in [place]  Date  03/23/18  Educator  Wynetta Emery  Instruction Review Code  2- Demonstrated Understanding      Cleaning Equipment: - Provides group verbal and written instruction about the health risks of elevated stress, cause of high stress, and healthy ways to reduce stress.   PULMONARY REHAB OTHER RESPIRATORY from 05/25/2018 in [place]  Date  03/30/18  Educator  Wynetta Emery  Instruction Review Code  2- Demonstrated Understanding      Nutrition I: Fats: - Discuss the types of cholesterol, what cholesterol does to the body, and how cholesterol levels can be controlled.   PULMONARY REHAB OTHER RESPIRATORY from 05/25/2018 in [place]  Date  04/06/18  Educator  Wynetta Emery  Instruction Review Code  2- Demonstrated Understanding      Nutrition II: Labels: -Discuss the different components of food labels and how to read food labels.   PULMONARY REHAB OTHER RESPIRATORY from 05/25/2018 in [place]  Date  04/20/18  Educator  DWynetta Emery  Instruction Review Code  2- Demonstrated Understanding      Respiratory Infections: - Discuss the signs and symptoms of respiratory infections, ways to prevent respiratory infections, and the  importance of seeking medical treatment when having a respiratory infection.   PULMONARY REHAB OTHER RESPIRATORY from 05/25/2018 in [place]  Date  04/27/18  Educator  Wynetta Emery  Instruction Review Code  2- Demonstrated Understanding      Stress I: Signs and Symptoms: - Discuss the causes of stress, how stress may lead to anxiety and depression, and ways to limit stress.   PULMONARY REHAB OTHER RESPIRATORY from 05/25/2018 in [place]  Date  05/04/18  Educator  MB  Instruction Review Code  2- Demonstrated  Understanding      Stress II: Relaxation: -Discuss relaxation techniques to limit stress.   PULMONARY REHAB OTHER RESPIRATORY from 05/25/2018 in [place]  Date  05/11/18  Educator  Wynetta Emery  Instruction Review Code  2- Demonstrated Understanding      Oxygen for Home/Travel: - Discuss how to prepare for travel when on oxygen and proper ways to transport and store oxygen to ensure safety.   PULMONARY REHAB OTHER RESPIRATORY from 05/25/2018 in [place]  Date  05/18/18  Educator  DJ  Instruction Review Code  2- Demonstrated Understanding      Knowledge Questionnaire Score: Knowledge Questionnaire Score - 03/15/18 1440      Knowledge Questionnaire Score   Pre Score  23/24       Core Components/Risk Factors/Patient Goals at Admission: Personal Goals and Risk Factors at Admission - 03/15/18 1440      Core Components/Risk Factors/Patient Goals on Admission    Weight Management  Weight Maintenance    Diabetes  Yes    Intervention  Provide education about signs/symptoms and action to take for hypo/hyperglycemia.;Provide education about proper nutrition, including hydration, and aerobic/resistive exercise prescription along with prescribed medications to achieve blood glucose in normal ranges: Fasting glucose 65-99 mg/dL    Expected Outcomes  Short Term: Participant verbalizes understanding of the signs/symptoms and immediate care of hyper/hypoglycemia, proper foot care and importance of medication, aerobic/resistive exercise and nutrition plan for blood glucose control.;Long Term: Attainment of HbA1C < 7%.    Personal Goal Other  Yes    Personal Goal  Get more stamina, continue to gain strength and stamina    Intervention  Attend PR 2 x week and supplement with home exercise 3 x week.     Expected Outcomes  Reach personal goals of gaining stamina       Core Components/Risk Factors/Patient Goals Review:  Goals and Risk Factor Review     Row Name 04/07/18 1317 04/27/18 1602 05/25/18 1409         Core Components/Risk Factors/Patient Goals Review   Personal Goals Review  Weight Management/Obesity;Improve shortness of breath with ADL's;Diabetes Increase strength and stamina.   Weight Management/Obesity;Improve shortness of breath with ADL's;Diabetes Increase strength and stamina.   Weight Management/Obesity;Improve shortness of breath with ADL's;Diabetes Increase stength and stamina.      Review  Patient has completed 7 sessions. She is doing well in the program with progression. Her last A1C on file was 11/09/17 at 6.9. Her fasting reported glucose readings are usually >200. She has advised on conpliance to a diabetic diet. She says she does not feel any stronger but the program has helped her "get moving". She hopes to meet her goals as she continues the program.   Patient has completed 10 sessions maintaining her weight since last 30 day review. Her last A1C was in January at 6.5 mg/dl. Her fasting glucose readings are usually <150 mg/dl.  She says she feels stonger and is moving more now doing more activities. 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 She is pleased with her progress in the program so far. Will continue to monitor for progress.   Patient has completed 18 sessions gaining 2 lbs since last 30 day review. She is doing well in the program with progression. Her last reported A1C was 6.5 mg/dl on 04/21/2018. Her reported fasting glucose readings have improved since she started the program. She is not usually >120. She states she has more energy and is moving more now. She is volunteering for Hospice without diffculty. Will continue to monitor for progress.      Expected Outcomes  Patient will continue to attend sessions and complete the program meeting her personal goals.   Patient will continue to attend sessions and complete the program meeting her personal goals.   Patient will continue to attend sessions and  complete the program meeting her personal goals.         Core Components/Risk Factors/Patient Goals at Discharge (Final Review):  Goals and Risk Factor Review - 05/25/18 1409      Core Components/Risk Factors/Patient Goals Review   Personal Goals Review  Weight Management/Obesity;Improve shortness of breath with ADL's;Diabetes   Increase stength and stamina.    Review  Patient has completed 18 sessions gaining 2 lbs since last 30 day review. She is doing well in the program with progression. Her last reported A1C was 6.5 mg/dl on 04/21/2018. Her reported fasting glucose readings have improved since she started the program. She is not usually >120. She states she has more energy and is moving more now. She is volunteering for Hospice without diffculty. Will continue to monitor for progress.     Expected Outcomes  Patient will continue to attend sessions and complete the program meeting her personal goals.        ITP Comments: ITP Comments    Row Name 03/15/18 1435 03/20/18 1504         ITP Comments  Mrs. Domanski is a pleasant 78 year old female that has CHF. 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   Patient is new to program. She plans to start 03/21/18. Will continue to monitor for progress.          Comments: ITP REVIEW Pt is making expected progress toward pulmonary rehab goals after completing 18 sessions. Recommend continued exercise, life style modification, education, and utilization of breathing techniques to increase stamina and strength and decrease shortness of breath with exertion.

## 2018-05-25 NOTE — Progress Notes (Signed)
Daily Session Note  Patient Details  Name: Julie Clay MRN: [id number] Date of Birth: 30-May-1940 Referring Provider:     PULMONARY REHAB OTHER RESP ORIENTATION from 03/15/2018 in [place]  Referring Provider  Caryl Comes      Encounter Date: 05/25/2018  Check In: Session Check In - 05/25/18 1045      Check-In   Supervising physician immediately available to respond to emergencies  See telemetry face sheet for immediately available MD    Location  [facility]    Staff Present  Benay Pike, Exercise Physiologist;Diane Coad, MS, EP, CH[facility], Exercise Physiologist;Aerika Groll Wynetta Emery, RN, BSN    Medication changes reported      No    Fall or balance concerns reported     No    Warm-up and Cool-down  Performed as group-led instruction    Resistance Training Performed  Yes    VAD Patient?  No    PAD/SET Patient?  No      Pain Assessment   Currently in Pain?  No/denies    Pain Score  0-No pain    Multiple Pain Sites  No       Capillary Blood Glucose: No results found for this or any previous visit (from the past 24 hour(s)).    Social History   Tobacco Use  Smoking Status Never Smoker  Smokeless Tobacco Never Used    Goals Met:  Proper associated with RPD/PD & O2 Sat Independence with exercise equipment Improved SOB with ADL's Using PLB without cueing & demonstrates good technique Exercise tolerated well No report of cardiac concerns or symptoms Strength training completed today  Goals Unmet:  Not Applicable  Comments: Pt able to follow exercise prescription today without complaint.  Will continue to monitor for progression. Check out 1145.   Dr. Sinda Du is Medical Director for [facility] Pulmonary Rehab.

## 2018-05-26 ENCOUNTER — Ambulatory Visit (INDEPENDENT_AMBULATORY_CARE_PROVIDER_SITE_OTHER): Payer: Medicare Other | Admitting: *Deleted

## 2018-05-26 DIAGNOSIS — Z23 Encounter for immunization: Secondary | ICD-10-CM | POA: Diagnosis not present

## 2018-05-29 ENCOUNTER — Ambulatory Visit: Payer: Medicare Other

## 2018-05-30 ENCOUNTER — Encounter (HOSPITAL_COMMUNITY)
Admission: RE | Admit: 2018-05-30 | Discharge: 2018-05-30 | Disposition: A | Discharge status: Home / Self Care | Payer: Medicare Other | Source: Ambulatory Visit | Attending: Internal Medicine | Admitting: Internal Medicine

## 2018-05-30 DIAGNOSIS — I5032 Chronic diastolic (congestive) heart failure: Secondary | ICD-10-CM | POA: Diagnosis not present

## 2018-05-30 NOTE — Progress Notes (Signed)
Daily Session Note  Patient Details  Name: LEILY CAPEK MRN: [id number] Date of Birth: 1940-09-21 Referring Provider:     PULMONARY REHAB OTHER RESP ORIENTATION from 03/15/2018 in [place]  Referring Provider  Caryl Comes      Encounter Date: 05/30/2018  Check In: Session Check In - 05/30/18 1045      Check-In   Supervising physician immediately available to respond to emergencies  See telemetry face sheet for immediately available MD    Location  [facility]    Staff Present  Benay Pike, Exercise Physiologist;Diane Coad, MS, EP, [facility], Exercise Physiologist    Medication changes reported      No    Fall or balance concerns reported     No    Tobacco Cessation  No Change    Warm-up and Cool-down  Performed as group-led instruction    Resistance Training Performed  Yes    VAD Patient?  No    PAD/SET Patient?  No      Pain Assessment   Currently in Pain?  No/denies    Pain Score  0-No pain    Multiple Pain Sites  No       Capillary Blood Glucose: No results found for this or any previous visit (from the past 24 hour(s)).    Social History   Tobacco Use  Smoking Status Never Smoker  Smokeless Tobacco Never Used    Goals Met:  Proper associated with RPD/PD & O2 Sat Independence with exercise equipment Using PLB without cueing & demonstrates good technique Exercise tolerated well Queuing for purse lip breathing No report of cardiac concerns or symptoms Strength training completed today  Goals Unmet:  Not Applicable  Comments: Pt able to follow exercise prescription today without complaint.  Will continue to monitor for progression. Check out 1145.   Dr. Sinda Du is Medical Director for [facility] Pulmonary Rehab.

## 2018-06-01 ENCOUNTER — Encounter (HOSPITAL_COMMUNITY)
Admission: RE | Admit: 2018-06-01 | Discharge: 2018-06-01 | Disposition: A | Discharge status: Home / Self Care | Payer: Medicare Other | Source: Ambulatory Visit | Attending: Internal Medicine | Admitting: Internal Medicine

## 2018-06-01 DIAGNOSIS — I5032 Chronic diastolic (congestive) heart failure: Secondary | ICD-10-CM

## 2018-06-01 NOTE — Progress Notes (Signed)
Daily Session Note  Patient Details  Name: Julie Clay MRN: [id number] Date of Birth: [birth year] Referring Provider:     PULMONARY REHAB OTHER RESP ORIENTATION from 03/15/2018 in [place]  Referring Provider  Caryl Comes      Encounter Date: 06/01/2018  Check In: Session Check In - 06/01/18 1045      Check-In   Supervising physician immediately available to respond to emergencies  See telemetry face sheet for immediately available MD    Location  [facility]    Staff Present  Benay Pike, Exercise Physiologist;Orvis Stann Wynetta Emery, RN, BSN    Medication changes reported      No    Fall or balance concerns reported     No    Warm-up and Cool-down  Performed as group-led instruction    Resistance Training Performed  Yes    VAD Patient?  No    PAD/SET Patient?  No      Pain Assessment   Currently in Pain?  No/denies    Pain Score  0-No pain    Multiple Pain Sites  No       Capillary Blood Glucose: No results found for this or any previous visit (from the past 24 hour(s)).    Social History   Tobacco Use  Smoking Status Never Smoker  Smokeless Tobacco Never Used    Goals Met:  Proper associated with RPD/PD & O2 Sat Independence with exercise equipment Improved SOB with ADL's Using PLB without cueing & demonstrates good technique Exercise tolerated well No report of cardiac concerns or symptoms Strength training completed today  Goals Unmet:  Not Applicable  Comments: Pt able to follow exercise prescription today without complaint.  Will continue to monitor for progression. Check out 1145.   Dr. Sinda Du is Medical Director for [facility] Pulmonary Rehab.

## 2018-06-06 ENCOUNTER — Encounter (HOSPITAL_COMMUNITY)
Admission: RE | Admit: 2018-06-06 | Discharge: 2018-06-06 | Disposition: A | Discharge status: Home / Self Care | Payer: Medicare Other | Source: Ambulatory Visit | Attending: Internal Medicine | Admitting: Internal Medicine

## 2018-06-06 DIAGNOSIS — I5032 Chronic diastolic (congestive) heart failure: Secondary | ICD-10-CM

## 2018-06-06 NOTE — Progress Notes (Signed)
Daily Session Note  Patient Details  Name: Julie Clay MRN: [id number] Date of Birth: [birth year] Referring Provider:     PULMONARY REHAB OTHER RESP ORIENTATION from 03/15/2018 in [place]  Referring Provider  Caryl Comes      Encounter Date: 06/06/2018  Check In: Session Check In - 06/06/18 1045      Check-In   Supervising physician immediately available to respond to emergencies  See telemetry face sheet for immediately available MD    Location  [facility]    Staff Present  Benay Pike, Exercise Physiologist;Diane Coad, MS, EP, [facility], Exercise Physiologist    Medication changes reported      No    Fall or balance concerns reported     No    Tobacco Cessation  No Change    Warm-up and Cool-down  Performed as group-led instruction    Resistance Training Performed  Yes    VAD Patient?  No    PAD/SET Patient?  No      Pain Assessment   Currently in Pain?  No/denies    Pain Score  0-No pain    Multiple Pain Sites  No       Capillary Blood Glucose: No results found for this or any previous visit (from the past 24 hour(s)).    Social History   Tobacco Use  Smoking Status Never Smoker  Smokeless Tobacco Never Used    Goals Met:  Proper associated with RPD/PD & O2 Sat Independence with exercise equipment Using PLB without cueing & demonstrates good technique Exercise tolerated well No report of cardiac concerns or symptoms Strength training completed today  Goals Unmet:  Not Applicable  Comments: Pt able to follow exercise prescription today without complaint.  Will continue to monitor for progression. Check out 1145.   Dr. Sinda Du is Medical Director for [facility] Pulmonary Rehab.

## 2018-06-07 DIAGNOSIS — Z1212 Encounter for screening for malignant neoplasm of rectum: Secondary | ICD-10-CM | POA: Diagnosis not present

## 2018-06-07 DIAGNOSIS — Z1211 Encounter for screening for malignant neoplasm of colon: Secondary | ICD-10-CM | POA: Diagnosis not present

## 2018-06-07 LAB — COLOGUARD

## 2018-06-08 ENCOUNTER — Encounter (HOSPITAL_COMMUNITY)
Admission: RE | Admit: 2018-06-08 | Discharge: 2018-06-08 | Disposition: A | Discharge status: Home / Self Care | Payer: Medicare Other | Source: Ambulatory Visit | Attending: Internal Medicine | Admitting: Internal Medicine

## 2018-06-08 DIAGNOSIS — I5032 Chronic diastolic (congestive) heart failure: Secondary | ICD-10-CM

## 2018-06-08 NOTE — Progress Notes (Signed)
Daily Session Note  Patient Details  Name: Julie Clay MRN: [id number] Date of Birth: [birth year] Referring Provider:     PULMONARY REHAB OTHER RESP ORIENTATION from 03/15/2018 in [place]  Referring Provider  Caryl Comes      Encounter Date: 06/08/2018  Check In: Session Check In - 06/08/18 1045      Check-In   Supervising physician immediately available to respond to emergencies  See telemetry face sheet for immediately available MD    Location  [facility]    Staff Present  Benay Pike, Exercise Physiologist;Kaleiyah Polsky Wynetta Emery, RN, BSN    Medication changes reported      No    Fall or balance concerns reported     No    Warm-up and Cool-down  Performed as group-led instruction    Resistance Training Performed  Yes    VAD Patient?  No    PAD/SET Patient?  No      Pain Assessment   Currently in Pain?  No/denies    Pain Score  0-No pain    Multiple Pain Sites  No       Capillary Blood Glucose: No results found for this or any previous visit (from the past 24 hour(s)).    Social History   Tobacco Use  Smoking Status Never Smoker  Smokeless Tobacco Never Used    Goals Met:  Proper associated with RPD/PD & O2 Sat Independence with exercise equipment Improved SOB with ADL's Using PLB without cueing & demonstrates good technique Exercise tolerated well No report of cardiac concerns or symptoms Strength training completed today  Goals Unmet:  Not Applicable  Comments: Pt able to follow exercise prescription today without complaint.  Will continue to monitor for progression. Check out 1145.   Dr. Sinda Du is Medical Director for [facility] Pulmonary Rehab.

## 2018-06-12 NOTE — Telephone Encounter (Signed)
Received the results of Cologuard screening.   Screening is negative.   A negative result indicates a low likelihood of colorectal cancer is present. Following a negative Cologuard result, the [employer] recommends a Cologuard re-screening interval of 3 years.

## 2018-06-13 ENCOUNTER — Encounter (HOSPITAL_COMMUNITY)
Admission: RE | Admit: 2018-06-13 | Discharge: 2018-06-13 | Disposition: A | Discharge status: Home / Self Care | Payer: Medicare Other | Source: Ambulatory Visit | Attending: Internal Medicine | Admitting: Internal Medicine

## 2018-06-13 DIAGNOSIS — I5032 Chronic diastolic (congestive) heart failure: Secondary | ICD-10-CM | POA: Diagnosis not present

## 2018-06-13 NOTE — Progress Notes (Signed)
Daily Session Note  Patient Details  Name: MEG NIEMEIER MRN: [id number] Date of Birth: [birth year] Referring Provider:     PULMONARY REHAB OTHER RESP ORIENTATION from 03/15/2018 in [place]  Referring Provider  Caryl Comes      Encounter Date: 06/13/2018  Check In: Session Check In - 06/13/18 1045      Check-In   Supervising physician immediately available to respond to emergencies  See telemetry face sheet for immediately available MD    Location  [facility]    Staff Present  Benay Pike, Exercise Physiologist;Diane Coad, MS, EP, [facility], Exercise Physiologist    Medication changes reported      No    Fall or balance concerns reported     No    Tobacco Cessation  No Change    Warm-up and Cool-down  Performed as group-led instruction    Resistance Training Performed  Yes    VAD Patient?  No    PAD/SET Patient?  No      Pain Assessment   Currently in Pain?  No/denies    Pain Score  0-No pain    Multiple Pain Sites  No       Capillary Blood Glucose: No results found for this or any previous visit (from the past 24 hour(s)).    Social History   Tobacco Use  Smoking Status Never Smoker  Smokeless Tobacco Never Used    Goals Met:  Proper associated with RPD/PD & O2 Sat Independence with exercise equipment Improved SOB with ADL's Using PLB without cueing & demonstrates good technique Exercise tolerated well No report of cardiac concerns or symptoms Strength training completed today  Goals Unmet:  Not Applicable  Comments: Pt able to follow exercise prescription today without complaint.  Will continue to monitor for progression. Check out 1145.   Dr. Sinda Du is Medical Director for [facility] Pulmonary Rehab.

## 2018-06-15 ENCOUNTER — Encounter (HOSPITAL_COMMUNITY): Payer: Medicare Other

## 2018-06-20 ENCOUNTER — Encounter (HOSPITAL_COMMUNITY)
Admission: RE | Admit: 2018-06-20 | Discharge: 2018-06-20 | Disposition: A | Discharge status: Home / Self Care | Payer: Medicare Other | Source: Ambulatory Visit | Attending: Internal Medicine | Admitting: Internal Medicine

## 2018-06-20 DIAGNOSIS — I5032 Chronic diastolic (congestive) heart failure: Secondary | ICD-10-CM

## 2018-06-20 NOTE — Progress Notes (Signed)
Daily Session Note  Patient Details  Name: Julie Clay MRN: [id number] Date of Birth: [birth year] Referring Provider:     PULMONARY REHAB OTHER RESP ORIENTATION from 03/15/2018 in [place]  Referring Provider  Caryl Comes      Encounter Date: 06/20/2018  Check In: Session Check In - 06/20/18 1045      Check-In   Supervising physician immediately available to respond to emergencies  See telemetry face sheet for immediately available MD    Location  [facility]    Staff Present  Benay Pike, Exercise Physiologist;Diane Coad, MS, EP, [facility], Exercise Physiologist    Medication changes reported      No    Fall or balance concerns reported     No    Tobacco Cessation  No Change    Warm-up and Cool-down  Performed as group-led instruction    Resistance Training Performed  Yes    VAD Patient?  No    PAD/SET Patient?  No      Pain Assessment   Currently in Pain?  No/denies    Pain Score  0-No pain    Multiple Pain Sites  No       Capillary Blood Glucose: No results found for this or any previous visit (from the past 24 hour(s)).    Social History   Tobacco Use  Smoking Status Never Smoker  Smokeless Tobacco Never Used    Goals Met:  Proper associated with RPD/PD & O2 Sat Independence with exercise equipment Improved SOB with ADL's Exercise tolerated well No report of cardiac concerns or symptoms Strength training completed today  Goals Unmet:  Not Applicable  Comments: Pt able to follow exercise prescription today without complaint.  Will continue to monitor for progression. Check out 1145.   Dr. Kate Sable is Medical Director for [facility] Re[facility].

## 2018-06-22 ENCOUNTER — Encounter (HOSPITAL_COMMUNITY)
Admission: RE | Admit: 2018-06-22 | Discharge: 2018-06-22 | Disposition: A | Discharge status: Home / Self Care | Payer: Medicare Other | Source: Ambulatory Visit | Attending: Internal Medicine | Admitting: Internal Medicine

## 2018-06-22 DIAGNOSIS — I5032 Chronic diastolic (congestive) heart failure: Secondary | ICD-10-CM

## 2018-06-22 NOTE — Progress Notes (Signed)
Pulmonary Individual Treatment Plan  Patient Details  Name: Julie Clay MRN: [id number] Date of Birth: 1940-11-12 Referring Provider:     PULMONARY REHAB OTHER RESP ORIENTATION from 03/15/2018 in [place]  Referring Provider  Caryl Comes      Initial Encounter Date:    PULMONARY REHAB OTHER RESP ORIENTATION from 03/15/2018 in [place]  Date  03/15/18      Visit Diagnosis: Chronic diastolic CHF (congestive heart failure) ([place])  Patient's Home Medications on Admission:   Current Outpatient Medications:  .  acetaminophen (TYLENOL) 500 MG tablet, Take 500 mg by mouth as needed for moderate pain. Takes 2 when needed, Disp: , Rfl:  .  albuterol (PROVENTIL HFA;VENTOLIN HFA) 108 (90 Base) MCG/ACT inhaler, Inhale 2 puffs into the lungs every 4 (four) hours as needed., Disp: 6.7 g, Rfl: 6 .  Aloe Vera Leaf POWD, Take 800 mg by mouth 2 (two) times daily. , Disp: , Rfl:  .  Ascorbic Acid (VITAMIN C) 1000 MG tablet, Take 1,000 mg by mouth 2 (two) times daily., Disp: , Rfl:  .  b complex vitamins tablet, Take 1 tablet by mouth 2 (two) times daily. , Disp: , Rfl:  .  Blood Glucose Monitoring Suppl (BLOOD GLUCOSE SYSTEM PAK) KIT, Use as directed to monitor FSBS 1x daily. Dx: E11.9. Please dispense as Accu-Chek Aviva, Disp: 1 each, Rfl: 1 .  Cholecalciferol (VITAMIN D3) 2000 UNITS TABS, Take 2,000 Units by mouth 2 (two) times daily., Disp: , Rfl:  .  Docusate Calcium (STOOL SOFTENER PO), Take 1 capsule by mouth as needed (constipation). , Disp: , Rfl:  .  Dulaglutide (TRULICITY) 7.06 CB/7.6EG SOPN, INJECT 1 PEN INTO THE SKIN ONCE EVERY WEEK for  diabetes, Disp: 2 mL, Rfl: 2 .  Echinacea 380 MG CAPS, Take 1 capsule by mouth as needed (cold/flue)., Disp: , Rfl:  .  fluocinonide cream (LIDEX) 3.15 %, Apply 1 application topically 2 (two) times daily as needed (poison ivy/bites/hives). (Patient not taking: Reported on 05/01/2018), Disp: 30 g, Rfl: 11 .  Ginkgo  Biloba 120 MG CAPS, Take 1 capsule by mouth daily. , Disp: , Rfl:  .  glipiZIDE (GLUCOTROL) 10 MG tablet, Take 0.5 tablets (5 mg total) by mouth 2 (two) times daily before a meal. For diabetes, Disp: 180 tablet, Rfl: 0 .  Glucose Blood (BLOOD GLUCOSE TEST STRIPS) STRP, Use as directed to monitor FSBS 1x daily. Dx: E11.9. Please dispense as Accu-Chek Aviva, Disp: 100 each, Rfl: 1 .  KRILL OIL PO, Take 1 capsule by mouth 2 (two) times daily., Disp: , Rfl:  .  Lancets MISC, Use as directed to monitor FSBS 1x daily. Dx: E11.9. Please dispense as Accu-Chek Aviva, Disp: 100 each, Rfl: 1 .  Melatonin 3 MG TABS, Take 3 mg by mouth at bedtime as needed and may repeat dose one time if needed (sleep). , Disp: , Rfl:  .  metFORMIN (GLUCOPHAGE) 1000 MG tablet, TAKE 1 TABLET BY MOUTH TWICE DAILY WITH A MEAL for diabetes, Disp: 180 tablet, Rfl: 1 .  metoprolol succinate (TOPROL-XL) 25 MG 24 hr tablet, Take 12.5 mg by mouth daily., Disp: , Rfl:  .  mometasone-formoterol (DULERA) 100-5 MCG/ACT AERO, Inhale 2 puffs into the lungs 2 (two) times daily. For asthma, Disp: 1 Inhaler, Rfl: 6 .  Multiple Vitamin (MULTIVITAMIN WITH MINERALS) TABS tablet, Take 1 tablet by mouth daily., Disp: , Rfl:  .  NONFORMULARY OR COMPOUNDED ITEM, Apply 1 application topically 2 (two)  times daily as needed. Reported on 06/10/2015, Disp: , Rfl:  .  rosuvastatin (CRESTOR) 5 MG tablet, TAKE 1 TABLET(5 MG) BY MOUTH DAILY for Cholesterol, Disp: 90 tablet, Rfl: 0 .  simethicone (MYLICON) 80 MG chewable tablet, Chew 80 mg by mouth as needed for flatulence. , Disp: , Rfl:  .  tiZANidine (ZANAFLEX) 4 MG tablet, Take 2 mg by mouth every 6 (six) hours as needed for muscle spasms., Disp: , Rfl:  .  torsemide (DEMADEX) 20 MG tablet, Take 10 mg by mouth daily., Disp: , Rfl:  .  triamcinolone cream (KENALOG) 0.1 %, Apply 1 application topically 2 (two) times daily. (Patient not taking: Reported on 05/01/2018), Disp: 30 g, Rfl: 0  Past Medical  History: Past Medical History:  Diagnosis Date  . Asthma    spirometry (2011)- mild ventilary defect  . Bone spur    "heels, knees, left shoulder" (06/22/2017)  . Breast cancer, left breast ([place]) dx'd 03/2011  . Chronic bronchitis ([place])   . COPD (chronic obstructive pulmonary disease) ([place])   . Fatty liver 2013   enlarged  . GERD (gastroesophageal reflux disease)   . HSV (herpes simplex virus) infection   . Hyperlipidemia   . Hypertension   . Lung nodule seen on imaging study 2013  . Lymphedema    Left arm  . 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 Osteoarthritis    "all over" (06/22/2017)  . Sleep apnea    Stop Bang score of 5. Pt has had sleep study, but was shown to be negative for sleep apnea.  . Type II diabetes mellitus ([place])   . Ventral hernia, unspecified, without mention of obstruction or gangrene     Tobacco Use: Social History   Tobacco Use  Smoking Status Never Smoker  Smokeless Tobacco Never Used    Labs: Recent Review Flowsheet Data    Labs for ITP Cardiac and Pulmonary Rehab Latest Ref Rng & Units 09/20/2016 03/14/2017 07/08/2017 11/09/2017 04/21/2018   Cholestrol <200 mg/dL 146 141 139 128 146   LDLCALC mg/dL (calc) 59 68 65 56 73   LDLDIRECT mg/dL - - - - -   HDL >50 mg/dL 44(L) 47(L) 50(L) 45(L) 48(L)   Trlycerides <150 mg/dL 214(H) 181(H) 159(H) 197(H) 169(H)   Hemoglobin A1c <5.7 % of total Hgb 7.2(H) 6.8(H) 6.5(H) 6.9(H) 6.5(H)      Capillary Blood Glucose: Lab Results  Component Value Date   GLUCAP 83 06/23/2017   GLUCAP 117 (H) 06/22/2017   GLUCAP 81 06/22/2017   GLUCAP 97 06/22/2017   GLUCAP 185 (H) 11/06/2013     Pulmonary Assessment Scores: Pulmonary Assessment Scores    Row Name 03/15/18 1437         ADL UCSD   ADL Phase  Entry     SOB Score total  69     Rest  0     Walk  11     Stairs  5     Bath  2     Dress  2     Shop  2       CAT Score   CAT Score  18       mMRC Score   mMRC Score  3        Pulmonary  Function Assessment:   Exercise Target Goals: Exercise Program Goal: Individual exercise prescription set using results from initial 6 min walk test and THRR while considering  patient's activity  barriers and safety.   Exercise Prescription Goal: Initial exercise prescription builds to 30-45 minutes a day of aerobic activity, 2-3 days per week.  Home exercise guidelines will be given to patient during program as part of exercise prescription that the participant will acknowledge.  Activity Barriers & Risk Stratification: Activity Barriers & Cardiac Risk Stratification - 03/15/18 1351      Activity Barriers & Cardiac Risk Stratification   Activity Barriers  Arthritis;Back Problems;Shortness of Breath;Deconditioning;Other (comment)    Comments  spurs in both heels and knees    Cardiac Risk Stratification  High       6 Minute Walk: 6 Minute Walk    Row Name 03/15/18 1350         6 Minute Walk   Phase  Initial     Distance  900 feet     Walk Time  6 minutes     # of Rest Breaks  2     MPH  1.7     METS  2.31     RPE  14     Perceived Dyspnea   14     VO2 Peak  5.49     Symptoms  Yes (comment)     Comments  SOB     Resting HR  93 bpm     Resting BP  122/68     Resting Oxygen Saturation   98 %     Exercise Oxygen Saturation  during 6 min walk  95 %     Max Ex. HR  110 bpm     Max Ex. BP  144/64     2 Minute Post BP  126/62        Oxygen Initial Assessment: Oxygen Initial Assessment - 03/15/18 1436      Home Oxygen   Home Oxygen Device  None    Sleep Oxygen Prescription  None    Home Exercise Oxygen Prescription  None    Home at Rest Exercise Oxygen Prescription  None      Initial 6 min Walk   Oxygen Used  None      Program Oxygen Prescription   Program Oxygen Prescription  None       Oxygen Re-Evaluation: Oxygen Re-Evaluation    Row Name 04/07/18 1313 04/27/18 1600 05/25/18 1407 06/22/18 1439       Program Oxygen Prescription   Program Oxygen  Prescription  None  None  None  None      Home Oxygen   Home Oxygen Device  None  None  None  None    Sleep Oxygen Prescription  None  None  None  None    Home Exercise Oxygen Prescription  None  None  None  None    Home at Rest Exercise Oxygen Prescription  None  None  None  None      Goals/Expected Outcomes   Short Term Goals  To learn and understand importance of monitoring SPO2 with pulse oximeter and demonstrate accurate use of the pulse oximeter.;To learn and understand importance of maintaining oxygen saturations>88%;To learn and demonstrate proper pursed lip breathing techniques or other breathing techniques.  To learn and understand importance of monitoring SPO2 with pulse oximeter and demonstrate accurate use of the pulse oximeter.;To learn and understand importance of maintaining oxygen saturations>88%;To learn and demonstrate proper pursed lip breathing techniques or other breathing techniques.  To learn and understand importance of monitoring SPO2 with pulse oximeter and demonstrate accurate use of the pulse oximeter.;To learn  and understand importance of maintaining oxygen saturations>88%;To learn and demonstrate proper pursed lip breathing techniques or other breathing techniques.  To learn and understand importance of monitoring SPO2 with pulse oximeter and demonstrate accurate use of the pulse oximeter.;To learn and understand importance of maintaining oxygen saturations>88%;To learn and demonstrate proper pursed lip breathing techniques or other breathing techniques.    Long  Term Goals  Verbalizes importance of monitoring SPO2 with pulse oximeter and return demonstration;Maintenance of O2 saturations>88%;Exhibits proper breathing techniques, such as pursed lip breathing or other method taught during program session  Verbalizes importance of monitoring SPO2 with pulse oximeter and return demonstration;Maintenance of O2 saturations>88%;Exhibits proper breathing techniques, such as  pursed lip breathing or other method taught during program session  Verbalizes importance of monitoring SPO2 with pulse oximeter and return demonstration;Maintenance of O2 saturations>88%;Exhibits proper breathing techniques, such as pursed lip breathing or other method taught during program session  Verbalizes importance of monitoring SPO2 with pulse oximeter and return demonstration;Maintenance of O2 saturations>88%;Exhibits proper breathing techniques, such as pursed lip breathing or other method taught during program session    Comments  -  Patient is able to use pulse oximeter properly with return demonstration and she is able to verbalize the importance of maintaining her O2Sat >88%.   Patient is able to use pulse oximeter properly with return demonstration and she is able to verbalize the importance of maintaining her O2Sat >88%.   Patient is able to use pulse oximeter properly with return demonstration and she is able to verbalize the importance of maintaining her O2Sat >88%.     Goals/Expected Outcomes  -  Patient will continue to meet her short and long term goals.   Patient will continue to meet her short and long term goals.   Patient will continue to meet her short and long term goals.        Oxygen Discharge (Final Oxygen Re-Evaluation): Oxygen Re-Evaluation - 06/22/18 1439      Program Oxygen Prescription   Program Oxygen Prescription  None      Home Oxygen   Home Oxygen Device  None    Sleep Oxygen Prescription  None    Home Exercise Oxygen Prescription  None    Home at Rest Exercise Oxygen Prescription  None      Goals/Expected Outcomes   Short Term Goals  To learn and understand importance of monitoring SPO2 with pulse oximeter and demonstrate accurate use of the pulse oximeter.;To learn and understand importance of maintaining oxygen saturations>88%;To learn and demonstrate proper pursed lip breathing techniques or other breathing techniques.    Long  Term Goals  Verbalizes  importance of monitoring SPO2 with pulse oximeter and return demonstration;Maintenance of O2 saturations>88%;Exhibits proper breathing techniques, such as pursed lip breathing or other method taught during program session    Comments  Patient is able to use pulse oximeter properly with return demonstration and she is able to verbalize the importance of maintaining her O2Sat >88%.     Goals/Expected Outcomes  Patient will continue to meet her short and long term goals.        Initial Exercise Prescription: Initial Exercise Prescription - 03/15/18 1400      Date of Initial Exercise RX and Referring Provider   Date  03/15/18    Referring Provider  Caryl Comes    Expected Discharge Date  06/15/18      NuStep   Level  1    SPM  60    Minutes  17  METs  1.7      Arm Ergometer   Level  1    Watts  11    RPM  50    Minutes  17    METs  1.8      Prescription Details   Frequency (times per week)  2    Duration  Progress to 30 minutes of continuous aerobic without signs/symptoms of physical distress      Intensity   THRR 40-80% of Max Heartrate  [phone]    Ratings of Perceived Exertion  11-13    Perceived Dyspnea  0-4      Progression   Progression  Continue to progress workloads to maintain intensity without signs/symptoms of physical distress.      Resistance Training   Training Prescription  Yes    Weight  1    Reps  10-15       Perform Capillary Blood Glucose checks as needed.  Exercise Prescription Changes:  Exercise Prescription Changes    Row Name 03/15/18 1400 04/06/18 1500 04/20/18 1200 05/03/18 1500 05/18/18 0700     Response to Exercise   Blood Pressure (Admit)  -  128/68  124/66  124/62  118/62   Blood Pressure (Exercise)  -  146/60  140/60  130/60  128/62   Blood Pressure (Exit)  -  112/60  116/64  118/68  122/62   Heart Rate (Admit)  -  106 bpm  83 bpm  78 bpm  90 bpm   Heart Rate (Exercise)  -  104 bpm  102 bpm  96 bpm  93 bpm   Heart Rate (Exit)  -  98  bpm  101 bpm  90 bpm  75 bpm   Oxygen Saturation (Admit)  -  96 %  96 %  97 %  97 %   Oxygen Saturation (Exercise)  -  95 %  96 %  95 %  96 %   Oxygen Saturation (Exit)  -  98 %  96 %  97 %  97 %   Rating of Perceived Exertion (Exercise)  -  '13  13  13  14   '$ Perceived Dyspnea (Exercise)  -  '14  14  13  13   '$ Symptoms  -  R hip pain  -  -  -   Comments  -  first two weeks of exercise!   -  -  -   Duration  -  Progress to 30 minutes of  aerobic without signs/symptoms of physical distress  Continue with 30 min of aerobic exercise without signs/symptoms of physical distress.  Continue with 30 min of aerobic exercise without signs/symptoms of physical distress.  Continue with 30 min of aerobic exercise without signs/symptoms of physical distress.   Intensity  -  THRR New [phone]  THRR unchanged  THRR unchanged  THRR unchanged     Progression   Progression  -  Continue to progress workloads to maintain intensity without signs/symptoms of physical distress.  Continue to progress workloads to maintain intensity without signs/symptoms of physical distress.  Continue to progress workloads to maintain intensity without signs/symptoms of physical distress.  Continue to progress workloads to maintain intensity without signs/symptoms of physical distress.   Average METs  -  '2  2  2  '$ 2.1     Resistance Training   Training Prescription  -  Yes  Yes  Yes  Yes   Weight  -  2  2  2  2   Reps  -  10-15  10-15  10-15  10-15     NuStep   Level  -  '2  2  2  2   '$ SPM  -  80  83  81  83   Minutes  -  '17  17  17  17   '$ METs  -  1.5  1.7  1.7  1.8     Arm Ergometer   Level  -  '2  2  2  3   '$ Watts  -  '20  12  18  18   '$ RPM  -  53  49  51  46   Minutes  -  '22  22  22  22   '$ METs  -  2.5  1.9  2.3  2.4     Home Exercise Plan   Plans to continue exercise at  Home (comment)  Home (comment)  Home (comment)  Home (comment)  Home (comment)   Frequency  Add 3 additional days to program exercise sessions.  Add 3  additional days to program exercise sessions.  Add 3 additional days to program exercise sessions.  Add 3 additional days to program exercise sessions.  Add 3 additional days to program exercise sessions.   Initial Home Exercises Provided  03/15/18  03/15/18  03/15/18  03/15/18  03/15/18   Row Name 05/30/18 0800 06/14/18 1500           Response to Exercise   Blood Pressure (Admit)  124/60  120/64      Blood Pressure (Exercise)  130/60  136/70      Blood Pressure (Exit)  124/60  116/68      Heart Rate (Admit)  78 bpm  78 bpm      Heart Rate (Exercise)  95 bpm  99 bpm      Heart Rate (Exit)  96 bpm  78 bpm      Oxygen Saturation (Admit)  98 %  98 %      Oxygen Saturation (Exercise)  96 %  95 %      Oxygen Saturation (Exit)  96 %  96 %      Rating of Perceived Exertion (Exercise)  13  13      Perceived Dyspnea (Exercise)  14  14      Comments  increase in overall MET level   -      Duration  Continue with 30 min of aerobic exercise without signs/symptoms of physical distress.  Continue with 30 min of aerobic exercise without signs/symptoms of physical distress.      Intensity  THRR unchanged  THRR unchanged        Progression   Progression  Continue to progress workloads to maintain intensity without signs/symptoms of physical distress.  Continue to progress workloads to maintain intensity without signs/symptoms of physical distress.      Average METs  2.2  2.2        Resistance Training   Training Prescription  Yes  Yes      Weight  3  3      Reps  10-15  10-15        NuStep   Level  3  3      SPM  86  88      Minutes  17  17      METs  1.8  2        Arm Ergometer  Level  3  3      Watts  22  30      RPM  51  50      Minutes  22  22      METs  2.6  2.4        Home Exercise Plan   Plans to continue exercise at  Home (comment)  Home (comment)      Frequency  Add 3 additional days to program exercise sessions.  Add 3 additional days to program exercise sessions.       Initial Home Exercises Provided  03/15/18  03/15/18         Exercise Comments:  Exercise Comments    Row Name 04/06/18 1516 04/27/18 0752 05/23/18 1204 05/30/18 0806 06/21/18 1436   Exercise Comments  Pt. has done well in the program so far. She has tolerated the exercises and increases with ease. Will continue to progress her as tolerated.   Pt. is doing well in PR. 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 She continues to come to class willing to work hard to improve her level of SOB with activity.   Pt. is now workin gon level 3 on both the Nustep and Arm Ergometer. She now has a goal to increase her distance each session. She feels as if she is able to do more outside of rehab with less SOB.   Pt. is working hard toward her goal of 1 mile on the NuStep on level 3.   Pt. is still working hard on her goal of 1 miles on the NuStep and is very close to reaching it. She is also up to level 3 on the Arm Ergometer.       Exercise Goals and Review:  Exercise Goals    Row Name 03/15/18 1351             Exercise Goals   Increase Physical Activity  Yes       Intervention  Provide advice, education, support and counseling about physical activity/exercise needs.;Develop an individualized exercise prescription for aerobic and resistive training based on initial evaluation findings, risk stratification, comorbidities and participant's personal goals.       Expected Outcomes  Short Term: Attend rehab on a regular basis to increase amount of physical activity.       Increase Strength and Stamina  Yes       Intervention  Provide advice, education, support and counseling about physical activity/exercise needs.;Develop an individualized exercise prescription for aerobic and resistive training based on initial evaluation findings, risk stratification, comorbidities and participant's personal goals.       Expected Outcomes  Short Term: Increase workloads from initial exercise  prescription for resistance, speed, and METs.       Able to understand and use rate of perceived exertion (RPE) scale  Yes       Intervention  Provide education and explanation on how to use RPE scale       Expected Outcomes  Short Term: Able to use RPE daily in rehab to express subjective intensity level;Long Term:  Able to use RPE to guide intensity level when exercising independently       Able to understand and use Dyspnea scale  Yes       Intervention  Provide education and explanation on how to use Dyspnea scale       Expected Outcomes  Short Term: Able to use Dyspnea scale daily  in rehab to express subjective sense of shortness of breath during exertion;Long Term: Able to use Dyspnea scale to guide intensity level when exercising independently       Knowledge and understanding of Target Heart Rate Range (THRR)  Yes       Intervention  Provide education and explanation of THRR including how the numbers were predicted and where they are located for reference       Expected Outcomes  Short Term: Able to state/look up THRR       Able to check pulse independently  Yes       Intervention  Provide education and demonstration on how to check pulse in carotid and radial arteries.;Review the importance of being able to check your own pulse for safety during independent exercise       Expected Outcomes  Short Term: Able to explain why pulse checking is important during independent exercise;Long Term: Able to check pulse independently and accurately       Understanding of Exercise Prescription  Yes       Intervention  Provide education, explanation, and written materials on patient's individual exercise prescription       Expected Outcomes  Short Term: Able to explain program exercise prescription;Long Term: Able to explain home exercise prescription to exercise independently          Exercise Goals Re-Evaluation : Exercise Goals Re-Evaluation    Row Name 04/06/18 1514 04/27/18 0751 05/23/18 1202  06/21/18 1434       Exercise Goal Re-Evaluation   Exercise Goals Review  Increase Physical Activity;Increase Strength and Stamina;Able to understand and use Dyspnea scale;Able to understand and use rate of perceived exertion (RPE) scale;Knowledge and understanding of Target Heart Rate Range (THRR);Able to check pulse independently;Improve claudication pain tolerance and improve walking ability;Understanding of Exercise Prescription  Increase Physical Activity;Increase Strength and Stamina;Able to understand and use Dyspnea scale;Able to understand and use rate of perceived exertion (RPE) scale;Knowledge and understanding of Target Heart Rate Range (THRR);Able to check pulse independently;Improve claudication pain tolerance and improve walking ability;Understanding of Exercise Prescription  Increase Physical Activity;Increase Strength and Stamina;Able to understand and use Dyspnea scale;Able to understand and use rate of perceived exertion (RPE) scale;Knowledge and understanding of Target Heart Rate Range (THRR);Able to check pulse independently;Improve claudication pain tolerance and improve walking ability;Understanding of Exercise Prescription  Increase Physical Activity;Increase Strength and Stamina;Able to understand and use Dyspnea scale;Able to understand and use rate of perceived exertion (RPE) scale;Knowledge and understanding of Target Heart Rate Range (THRR);Able to check pulse independently;Improve claudication pain tolerance and improve walking ability;Understanding of Exercise Prescription    Comments  Pt. has attended 7 exercise sessions so far. She has tolerated the exercise well, other than some R hip pain that she feels started outside of rehab. We will continue to monitor and progress as we see fit.   Pt. continues to do well in the program. She works hard to push herself and increase her tolerance. We woll continue to monitor and progess as necessary.  Pt. has done well in PR. She has  attended 16 session so far. We have talked about her pushing herself to work harder in order to increase her stamina for when she gets out of rehab.   Pt. continues to do well in the program. Since our talk about pushing herself she has tried her best to get at least a mile on the NuStep every session. She states this has helped her to be alble to do  more at home before needing to stop and take a break.     Expected Outcomes  increase strength and stamina.   increase strength and stamina.   increase strength and stamina.   increase strength and stamina.        Discharge Exercise Prescription (Final Exercise Prescription Changes): Exercise Prescription Changes - 06/14/18 1500      Response to Exercise   Blood Pressure (Admit)  120/64    Blood Pressure (Exercise)  136/70    Blood Pressure (Exit)  116/68    Heart Rate (Admit)  78 bpm    Heart Rate (Exercise)  99 bpm    Heart Rate (Exit)  78 bpm    Oxygen Saturation (Admit)  98 %    Oxygen Saturation (Exercise)  95 %    Oxygen Saturation (Exit)  96 %    Rating of Perceived Exertion (Exercise)  13    Perceived Dyspnea (Exercise)  14    Duration  Continue with 30 min of aerobic exercise without signs/symptoms of physical distress.    Intensity  THRR unchanged      Progression   Progression  Continue to progress workloads to maintain intensity without signs/symptoms of physical distress.    Average METs  2.2      Resistance Training   Training Prescription  Yes    Weight  3    Reps  10-15      NuStep   Level  3    SPM  88    Minutes  17    METs  2      Arm Ergometer   Level  3    Watts  30    RPM  50    Minutes  22    METs  2.4      Home Exercise Plan   Plans to continue exercise at  Home (comment)    Frequency  Add 3 additional days to program exercise sessions.    Initial Home Exercises Provided  03/15/18       Nutrition:  Target Goals: Understanding of nutrition guidelines, daily intake of sodium '1500mg'$ , cholesterol  '200mg'$ , calories 30% from fat and 7% or less from saturated fats, daily to have 5 or more servings of fruits and vegetables.  Biometrics: Pre Biometrics - 03/15/18 1352      Pre Biometrics   Height  '5\' 5"'$  (1.651 m)    Weight  101.4 kg    Waist Circumference  44.5 inches    Hip Circumference  58.5 inches    Waist to Hip Ratio  0.76 %    BMI (Calculated)  37.2    Triceps Skinfold  29 mm    % Body Fat  47.9 %    Grip Strength  16.7 kg    Flexibility  0 in    Single Leg Stand  7.38 seconds        Nutrition Therapy Plan and Nutrition Goals: Nutrition Therapy & Goals - 06/22/18 1439      Nutrition Therapy   RD appointment deferred  Yes      Personal Nutrition Goals   Comments  Patient did not meet with RD in March. She states she is eating more salads and fruits. She also continues to follow a diabetic diet.       Intervention Plan   Intervention  Nutrition handout(s) given to patient.       Nutrition Assessments: Nutrition Assessments - 03/15/18 1439      MEDFICTS  Scores   Pre Score  53       Nutrition Goals Re-Evaluation:   Nutrition Goals Discharge (Final Nutrition Goals Re-Evaluation):   Psychosocial: Target Goals: Acknowledge presence or absence of significant depression and/or stress, maximize coping skills, provide positive support system. Participant is able to verbalize types and ability to use techniques and skills needed for reducing stress and depression.  Initial Review & Psychosocial Screening: Initial Psych Review & Screening - 03/15/18 1438      Initial Review   Current issues with  None Identified      Family Dynamics   Good Support System?  Yes      Barriers   Psychosocial barriers to participate in program  There are no identifiable barriers or psychosocial needs.      Screening Interventions   Interventions  Encouraged to exercise    Expected Outcomes  Short Term goal: Identification and review with participant of any Quality of Life or  Depression concerns found by scoring the questionnaire.;Long Term goal: The participant improves quality of Life and PHQ9 Scores as seen by post scores and/or verbalization of changes       Quality of Life Scores: Quality of Life - 03/15/18 1353      Quality of Life   Select  Quality of Life      Quality of Life Scores   Health/Function Pre  22.37 %    Socioeconomic Pre  23.33 %    Psych/Spiritual Pre  22.79 %    Family Pre  22.9 %    GLOBAL Pre  22.71 %      Scores of 19 and below usually indicate a poorer quality of life in these areas.  A difference of  2-3 points is a clinically meaningful difference.  A difference of 2-3 points in the total score of the Quality of Life Index has been associated with significant improvement in overall quality of life, self-image, physical symptoms, and general health in studies assessing change in quality of life.   PHQ-9: Recent Review Flowsheet Data    Depression screen [facility] 2/9 04/25/2018 03/15/2018 11/11/2017 07/07/2017 03/15/2017   Decreased Interest 0 0 0 0 0   Down, Depressed, Hopeless 0 0 0 0 0   PHQ - 2 Score 0 0 0 0 0   Altered sleeping - 0 - - 1   Tired, decreased energy - 3 - - 1   Change in appetite - - - - 0   Feeling bad or failure about yourself  - 0 - - 0   Trouble concentrating - 0 - - 0   Moving slowly or fidgety/restless - 0 - - 0   Suicidal thoughts - 0 - - 0   PHQ-9 Score - 3 - - 2   Difficult doing work/chores - Somewhat difficult - - Not difficult at all     Interpretation of Total Score  Total Score Depression Severity:  1-4 = Minimal depression, 5-9 = Mild depression, 10-14 = Moderate depression, 15-19 = Moderately severe depression, 20-27 = Severe depression   Psychosocial Evaluation and Intervention: Psychosocial Evaluation - 03/15/18 1439      Psychosocial Evaluation & Interventions   Interventions  Encouraged to exercise with the program and follow exercise prescription    Continue Psychosocial Services   No  Follow up required       Psychosocial Re-Evaluation: Psychosocial Re-Evaluation    Row Name 04/07/18 1320 04/27/18 1604 05/25/18 1414 06/22/18 1442  Psychosocial Re-Evaluation   Current issues with  None Identified  None Identified  None Identified  None Identified    Comments  Patient's initial QOL score was 22.71 and her PHQ-9 score was 3 with no psychosocial issues identified.   Patient's initial QOL score was 22.71 and her PHQ-9 score was 3 with no psychosocial issues identified.   Patient's initial QOL score was 22.71 and her PHQ-9 score was 3 with no psychosocial issues identified.   Patient's initial QOL score was 22.71 and her PHQ-9 score was 3 with no psychosocial issues identified.     Expected Outcomes  Patient will have no psychosocial issues identified at discharge.   Patient will have no psychosocial issues identified at discharge.   Patient will have no psychosocial issues identified at discharge.   Patient will have no psychosocial issues identified at discharge.     Interventions  Encouraged to attend Pulmonary Rehabilitation for the exercise;Relaxation education;Stress management education  Encouraged to attend Pulmonary Rehabilitation for the exercise;Relaxation education;Stress management education  Encouraged to attend Pulmonary Rehabilitation for the exercise;Relaxation education;Stress management education  Encouraged to attend Pulmonary Rehabilitation for the exercise;Relaxation education;Stress management education    Continue Psychosocial Services   No Follow up required  No Follow up required  No Follow up required  No Follow up required       Psychosocial Discharge (Final Psychosocial Re-Evaluation): Psychosocial Re-Evaluation - 06/22/18 1442      Psychosocial Re-Evaluation   Current issues with  None Identified    Comments  Patient's initial QOL score was 22.71 and her PHQ-9 score was 3 with no psychosocial issues identified.     Expected Outcomes  Patient  will have no psychosocial issues identified at discharge.     Interventions  Encouraged to attend Pulmonary Rehabilitation for the exercise;Relaxation education;Stress management education    Continue Psychosocial Services   No Follow up required        Education: Education Goals: Education classes will be provided on a weekly basis, covering required topics. Participant will state understanding/return demonstration of topics presented.  Learning Barriers/Preferences: Learning Barriers/Preferences - 03/15/18 1440      Learning Barriers/Preferences   Learning Barriers  None    Learning Preferences  Audio;Computer/Internet;Group Instruction;Individual Instruction;Pictoral;Skilled Demonstration;Verbal Instruction;Video;Written Material       Education Topics: How Lungs Work and Diseases: - Discuss the anatomy of the lungs and diseases that can affect the lungs, such as COPD.   PULMONARY REHAB OTHER RESPIRATORY from 06/22/2018 in [place]  Date  05/25/18  Educator  Wynetta Emery  Instruction Review Code  2- Demonstrated Understanding      Exercise: -Discuss the importance of exercise, FITT principles of exercise, normal and abnormal responses to exercise, and how to exercise safely.   Environmental Irritants: -Discuss types of environmental irritants and how to limit exposure to environmental irritants.   PULMONARY REHAB OTHER RESPIRATORY from 06/22/2018 in [place]  Date  06/01/18  Educator  Wynetta Emery  Instruction Review Code  2- Demonstrated Understanding      Meds/Inhalers and oxygen: - Discuss respiratory medications, definition of an inhaler and oxygen, and the proper way to use an inhaler and oxygen.   PULMONARY REHAB OTHER RESPIRATORY from 06/22/2018 in [place]  Date  06/08/18  Educator  Wynetta Emery      Energy Saving Techniques: - Discuss methods to conserve energy and decrease shortness of breath when  performing activities of daily living.    Bronchial Hygiene /  Breathing Techniques: - Discuss breathing mechanics, pursed-lip breathing technique,  proper posture, effective ways to clear airways, and other functional breathing techniques   PULMONARY REHAB OTHER RESPIRATORY from 06/22/2018 in [place]  Date  03/23/18  Educator  Wynetta Emery  Instruction Review Code  2- Demonstrated Understanding      Cleaning Equipment: - Provides group verbal and written instruction about the health risks of elevated stress, cause of high stress, and healthy ways to reduce stress.   PULMONARY REHAB OTHER RESPIRATORY from 06/22/2018 in [place]  Date  03/30/18  Educator  Wynetta Emery  Instruction Review Code  2- Demonstrated Understanding      Nutrition I: Fats: - Discuss the types of cholesterol, what cholesterol does to the body, and how cholesterol levels can be controlled.   PULMONARY REHAB OTHER RESPIRATORY from 06/22/2018 in [place]  Date  04/06/18  Educator  Wynetta Emery  Instruction Review Code  2- Demonstrated Understanding      Nutrition II: Labels: -Discuss the different components of food labels and how to read food labels.   PULMONARY REHAB OTHER RESPIRATORY from 06/22/2018 in [place]  Date  04/20/18  Educator  DWynetta Emery  Instruction Review Code  2- Demonstrated Understanding      Respiratory Infections: - Discuss the signs and symptoms of respiratory infections, ways to prevent respiratory infections, and the importance of seeking medical treatment when having a respiratory infection.   PULMONARY REHAB OTHER RESPIRATORY from 06/22/2018 in [place]  Date  04/27/18  Educator  Wynetta Emery  Instruction Review Code  2- Demonstrated Understanding      Stress I: Signs and Symptoms: - Discuss the causes of stress, how stress may lead to anxiety and depression, and ways to limit  stress.   PULMONARY REHAB OTHER RESPIRATORY from 06/22/2018 in [place]  Date  05/04/18  Educator  MB  Instruction Review Code  2- Demonstrated Understanding      Stress II: Relaxation: -Discuss relaxation techniques to limit stress.   PULMONARY REHAB OTHER RESPIRATORY from 06/22/2018 in [place]  Date  05/11/18  Educator  Wynetta Emery  Instruction Review Code  2- Demonstrated Understanding      Oxygen for Home/Travel: - Discuss how to prepare for travel when on oxygen and proper ways to transport and store oxygen to ensure safety.   PULMONARY REHAB OTHER RESPIRATORY from 06/22/2018 in [place]  Date  05/18/18  Educator  DJ  Instruction Review Code  2- Demonstrated Understanding      Knowledge Questionnaire Score: Knowledge Questionnaire Score - 03/15/18 1440      Knowledge Questionnaire Score   Pre Score  23/24       Core Components/Risk Factors/Patient Goals at Admission: Personal Goals and Risk Factors at Admission - 03/15/18 1440      Core Components/Risk Factors/Patient Goals on Admission    Weight Management  Weight Maintenance    Diabetes  Yes    Intervention  Provide education about signs/symptoms and action to take for hypo/hyperglycemia.;Provide education about proper nutrition, including hydration, and aerobic/resistive exercise prescription along with prescribed medications to achieve blood glucose in normal ranges: Fasting glucose 65-99 mg/dL    Expected Outcomes  Short Term: Participant verbalizes understanding of the signs/symptoms and immediate care of hyper/hypoglycemia, proper foot care and importance of medication, aerobic/resistive exercise and nutrition plan for blood glucose control.;Long Term: Attainment of HbA1C < 7%.    Personal  Goal Other  Yes    Personal Goal  Get more stamina, continue to gain strength and stamina    Intervention  Attend PR 2 x week and supplement with home exercise  3 x week.     Expected Outcomes  Reach personal goals of gaining stamina       Core Components/Risk Factors/Patient Goals Review:  Goals and Risk Factor Review    Row Name 04/07/18 1317 04/27/18 1602 05/25/18 1409 06/22/18 1440       Core Components/Risk Factors/Patient Goals Review   Personal Goals Review  Weight Management/Obesity;Improve shortness of breath with ADL's;Diabetes Increase strength and stamina.   Weight Management/Obesity;Improve shortness of breath with ADL's;Diabetes Increase strength and stamina.   Weight Management/Obesity;Improve shortness of breath with ADL's;Diabetes Increase stength and stamina.   Weight Management/Obesity;Improve shortness of breath with ADL's;Diabetes Increase strength and stamina.     Review  Patient has completed 7 sessions. She is doing well in the program with progression. Her last A1C on file was 11/09/17 at 6.9. Her fasting reported glucose readings are usually >200. She has advised on conpliance to a diabetic diet. She says she does not feel any stronger but the program has helped her "get moving". She hopes to meet her goals as she continues the program.   Patient has completed 10 sessions maintaining her weight since last 30 day review. Her last A1C was in January at 6.5 mg/dl. Her fasting glucose readings are usually <150 mg/dl. She says she feels stonger and is moving more now doing more activities. 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 She is pleased with her progress in the program so far. Will continue to monitor for progress.   Patient has completed 18 sessions gaining 2 lbs since last 30 day review. She is doing well in the program with progression. Her last reported A1C was 6.5 mg/dl on 04/21/2018. Her reported fasting glucose readings have improved since she started the program. She is not usually >120. She states she has more energy and is moving more now. She is volunteering for Hospice without diffculty. Will continue to  monitor for progress.   Patient has completed 25 sessions losing 1 lb since last 30 day review. She continues to do well in the program with progression. Her last A1C was 04/21/18 at 6.5 mg/dl. Her reported fasting glucose readings continue to average 125. 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 Will continue to monitor for progress.     Expected Outcomes  Patient will continue to attend sessions and complete the program meeting her personal goals.   Patient will continue to attend sessions and complete the program meeting her personal goals.   Patient will continue to attend sessions and complete the program meeting her personal goals.   Patient will continue to attend sessions and complete the program meeting her personal goals.        Core Components/Risk Factors/Patient Goals at Discharge (Final Review):  Goals and Risk Factor Review - 06/22/18 1440      Core Components/Risk Factors/Patient Goals Review   Personal Goals Review  Weight Management/Obesity;Improve shortness of breath with ADL's;Diabetes   Increase strength and stamina.    Review  Patient has completed 25 sessions losing 1 lb since last 30 day review. She continues to do well in the program with progression. Her last A1C was 04/21/18 at 6.5 mg/dl. Her reported fasting glucose readings continue to average 125.  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 Will continue to monitor for progress.     Expected Outcomes  Patient will continue to attend sessions and complete the program meeting her personal goals.        ITP Comments: ITP Comments    Row Name 03/15/18 1435 03/20/18 1504         ITP Comments  Mrs. Cockerill is a pleasant 78 year old female that has CHF. 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   Patient is new to program. She plans to start 03/21/18. Will continue  to monitor for progress.          Comments: ITP REVIEW Pt is making expected progress toward pulmonary rehab goals after completing 25 sessions. Recommend continued exercise, life style modification, education, and utilization of breathing techniques to increase stamina and strength and decrease shortness of breath with exertion.

## 2018-06-22 NOTE — Progress Notes (Signed)
Daily Session Note  Patient Details  Name: Julie Clay MRN: [id number] Date of Birth: 1940/09/24 Referring Provider:     PULMONARY REHAB OTHER RESP ORIENTATION from 03/15/2018 in [place]  Referring Provider  Caryl Comes      Encounter Date: 06/22/2018  Check In: Session Check In - 06/22/18 1130      Check-In   Supervising physician immediately available to respond to emergencies  See telemetry face sheet for immediately available MD    Location  [facility]    Staff Present  Benay Pike, Exercise Physiologist;Diane Coad, MS, EP, CHC, Exercise Physiologist;Debra Wynetta Emery, RN, BSN    Medication changes reported      No    Fall or balance concerns reported     No    Tobacco Cessation  No Change    Warm-up and Cool-down  Performed as group-led instruction    Resistance Training Performed  Yes    VAD Patient?  No    PAD/SET Patient?  No      Pain Assessment   Currently in Pain?  No/denies    Pain Score  0-No pain    Multiple Pain Sites  No       Capillary Blood Glucose: No results found for this or any previous visit (from the past 24 hour(s)).    Social History   Tobacco Use  Smoking Status Never Smoker  Smokeless Tobacco Never Used    Goals Met:  Proper associated with RPD/PD & O2 Sat Independence with exercise equipment Improved SOB with ADL's Exercise tolerated well No report of cardiac concerns or symptoms Strength training completed today  Goals Unmet:  Not Applicable  Comments: Pt able to follow exercise prescription today without complaint.  Will continue to monitor for progression. Check out 1145.   Dr. Sinda Du is Medical Director for [facility].

## 2018-06-26 ENCOUNTER — Ambulatory Visit (INDEPENDENT_AMBULATORY_CARE_PROVIDER_SITE_OTHER): Payer: Medicare Other | Admitting: *Deleted

## 2018-06-26 DIAGNOSIS — I441 Atrioventricular block, second degree: Secondary | ICD-10-CM | POA: Diagnosis not present

## 2018-06-26 DIAGNOSIS — Z23 Encounter for immunization: Secondary | ICD-10-CM | POA: Diagnosis not present

## 2018-06-26 DIAGNOSIS — I4430 Unspecified atrioventricular block: Secondary | ICD-10-CM

## 2018-06-27 ENCOUNTER — Encounter (HOSPITAL_COMMUNITY)
Admission: RE | Admit: 2018-06-27 | Discharge: 2018-06-27 | Disposition: A | Discharge status: Home / Self Care | Payer: Medicare Other | Source: Ambulatory Visit | Attending: Internal Medicine | Admitting: Internal Medicine

## 2018-06-27 DIAGNOSIS — I5032 Chronic diastolic (congestive) heart failure: Secondary | ICD-10-CM | POA: Diagnosis not present

## 2018-06-27 LAB — CUP PACEART REMOTE DEVICE CHECK
Battery Remaining Longevity: 95 mo
Battery Remaining Percentage: 95.5 %
Battery Voltage: 3.01 V
Brady Statistic AP VP Percent: 11 %
Brady Statistic AP VS Percent: 1 %
Brady Statistic AS VP Percent: 89 %
Brady Statistic AS VS Percent: 1 %
Brady Statistic RA Percent Paced: 11 %
Brady Statistic RV Percent Paced: 99 %
Implantable Lead Implant Date: 20190306
Implantable Lead Implant Date: 20190306
Implantable Lead Location: 753860
Implantable Lead Model: 5076
Implantable Lead Model: 5076
Implantable Pulse Generator Implant Date: 20190306
Lead Channel Impedance Value: 340 Ohm
Lead Channel Impedance Value: 440 Ohm
Lead Channel Pacing Threshold Amplitude: 0.5 V
Lead Channel Pacing Threshold Pulse Width: 0.5 ms
Lead Channel Pacing Threshold Pulse Width: 0.5 ms
Lead Channel Sensing Intrinsic Amplitude: 3.6 mV
Lead Channel Sensing Intrinsic Amplitude: 7.3 mV
Lead Channel Setting Pacing Amplitude: 2 V
Lead Channel Setting Sensing Sensitivity: 2 mV
MDC IDC LEAD LOCATION: 753859
MDC IDC MSMT LEADCHNL RV PACING THRESHOLD AMPLITUDE: 0.75 V
MDC IDC SESS DTM: 20200309070014
MDC IDC SET LEADCHNL RV PACING AMPLITUDE: 2.5 V
MDC IDC SET LEADCHNL RV PACING PULSEWIDTH: 0.5 ms
Pulse Gen Model: 2272
Pulse Gen Serial Number: 9001027

## 2018-06-27 NOTE — Progress Notes (Signed)
Daily Session Note  Patient Details  Name: Julie Clay MRN: [id number] Date of Birth: 12-08-1940 Referring Provider:     PULMONARY REHAB OTHER RESP ORIENTATION from 03/15/2018 in [place]  Referring Provider  Caryl Comes      Encounter Date: 06/27/2018  Check In: Session Check In - 06/27/18 1045      Check-In   Supervising physician immediately available to respond to emergencies  See telemetry face sheet for immediately available MD    Location  [facility]    Staff Present  Benay Pike, Exercise Physiologist;Diane Coad, MS, EP, [facility], Exercise Physiologist    Medication changes reported      No    Fall or balance concerns reported     No    Tobacco Cessation  No Change    Warm-up and Cool-down  Performed as group-led instruction    Resistance Training Performed  Yes    VAD Patient?  No    PAD/SET Patient?  No      Pain Assessment   Currently in Pain?  No/denies    Pain Score  0-No pain    Multiple Pain Sites  No       Capillary Blood Glucose: No results found for this or any previous visit (from the past 24 hour(s)).    Social History   Tobacco Use  Smoking Status Never Smoker  Smokeless Tobacco Never Used    Goals Met:  Proper associated with RPD/PD & O2 Sat Independence with exercise equipment Improved SOB with ADL's Exercise tolerated well No report of cardiac concerns or symptoms Strength training completed today  Goals Unmet:  Not Applicable  Comments: Pt able to follow exercise prescription today without complaint.  Will continue to monitor for progression. Check out 1145.   Dr. Sinda Du is Medical Director for [facility] Pul[facility] Re[facility].

## 2018-06-29 ENCOUNTER — Encounter (HOSPITAL_COMMUNITY): Payer: Medicare Other

## 2018-07-04 ENCOUNTER — Other Ambulatory Visit: Payer: Self-pay

## 2018-07-04 ENCOUNTER — Encounter: Payer: Self-pay | Admitting: Family Medicine

## 2018-07-04 ENCOUNTER — Encounter (HOSPITAL_COMMUNITY): Payer: Medicare Other

## 2018-07-04 ENCOUNTER — Ambulatory Visit (INDEPENDENT_AMBULATORY_CARE_PROVIDER_SITE_OTHER): Payer: Medicare Other | Admitting: Family Medicine

## 2018-07-04 ENCOUNTER — Telehealth: Payer: Self-pay | Admitting: *Deleted

## 2018-07-04 VITALS — BP 120/60 | HR 94 | Temp 98.1°F | Resp 18 | Ht 65.0 in | Wt 222.0 lb

## 2018-07-04 DIAGNOSIS — J019 Acute sinusitis, unspecified: Secondary | ICD-10-CM | POA: Diagnosis not present

## 2018-07-04 MED ORDER — AMOXICILLIN 875 MG PO TABS: 875.0000 mg | ORAL_TABLET | Freq: Two times a day (BID) | ORAL | 0 refills | Status: DC

## 2018-07-04 MED ORDER — LEVOCETIRIZINE DIHYDROCHLORIDE 5 MG PO TABS: 5.0000 mg | ORAL_TABLET | Freq: Every evening | ORAL | 0 refills | Status: DC

## 2018-07-04 MED ORDER — FLUTICASONE PROPIONATE 50 MCG/ACT NA SUSP: 2.0000 | Freq: Every day | NASAL | 6 refills | Status: DC

## 2018-07-04 NOTE — Progress Notes (Signed)
Subjective:    Patient ID: Julie Clay, female    DOB: 03-Jan-1941, 78 y.o.   MRN: [id number]  HPI The patient symptoms began 2-1/2 weeks ago.  Symptoms include itchy watery eyes left greater than right.  She also reports rhinorrhea and sneezing.  She reports head congestion postnasal drip.  She believes she has a sinus infection.  She denies any fever or chills.  She denies any severe headache.  She does have some very minimal tenderness to palpation over the left maxillary sinus. Past Medical History:  Diagnosis Date  . Asthma    spirometry (2011)- mild ventilary defect  . Bone spur    "heels, knees, left shoulder" (06/22/2017)  . Breast cancer, left breast ([place]) dx'd 03/2011  . Chronic bronchitis ([place])   . COPD (chronic obstructive pulmonary disease) ([place])   . Fatty liver 2013   enlarged  . GERD (gastroesophageal reflux disease)   . HSV (herpes simplex virus) infection   . Hyperlipidemia   . Hypertension   . Lung nodule seen on imaging study 2013  . Lymphedema    Left arm  . 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 Osteoarthritis    "all over" (06/22/2017)  . Sleep apnea    Stop Bang score of 5. Pt has had sleep study, but was shown to be negative for sleep apnea.  . Type II diabetes mellitus ([place])   . Ventral hernia, unspecified, without mention of obstruction or gangrene    Past Surgical History:  Procedure Laterality Date  . BREAST BIOPSY Left 04/2011  . BREAST BIOPSY Left 06/22/13  . BREAST BIOPSY Left 09/12/2013   Procedure: BREAST BIOPSY WITH NEEDLE LOCALIZATION;  Surgeon: Jamesetta So, MD;  Location: [facility];  Service: General;  Laterality: Left;  . BREAST LUMPECTOMY Left 04/2011 X 2  . CATARACT EXTRACTION W/ INTRAOCULAR LENS  IMPLANT, BILATERAL Bilateral   . CATARACT EXTRACTION, BILATERAL     on different occassions   . [place]  . DILATION AND CURETTAGE OF UTERUS  11/06/2013  . HAND LIGAMENT RECONSTRUCTION Right ~ 2010  . HYSTEROSCOPY W/D&C  N/A 11/06/2013   Procedure: DILATATION AND CURETTAGE /HYSTEROSCOPY;  Surgeon: Jonnie Kind, MD;  Location: [facility];  Service: Gynecology;  Laterality: N/A;  . INSERT / REPLACE / REMOVE PACEMAKER  06/22/2017  . LUNG LOBECTOMY Right 1988   Fungal Infection  . PACEMAKER IMPLANT N/A 06/22/2017   Procedure: PACEMAKER IMPLANT;  Surgeon: Deboraha Sprang, MD;  Location: [facility];  Service: Cardiovascular;  Laterality: N/A;  . POLYPECTOMY N/A 11/06/2013   Procedure: POLYPECTOMY (REMOVAL ENDOMETRIAL POLYP);  Surgeon: Jonnie Kind, MD;  Location: [facility];  Service: Gynecology;  Laterality: N/A;  . RECTOCELE REPAIR N/A 11/06/2013   Procedure: POSTERIOR REPAIR (RECTOCELE);  Surgeon: Jonnie Kind, MD;  Location: [facility];  Service: Gynecology;  Laterality: N/A;  . SHOULDER ARTHROSCOPY WITH OPEN ROTATOR CUFF REPAIR Left 07/26/2012  . TONSILLECTOMY  1949  . TUBAL LIGATION     Current Outpatient Medications on File Prior to Visit  Medication Sig Dispense Refill  . acetaminophen (TYLENOL) 500 MG tablet Take 500 mg by mouth as needed for moderate pain. Takes 2 when needed    . albuterol (PROVENTIL HFA;VENTOLIN HFA) 108 (90 Base) MCG/ACT inhaler Inhale 2 puffs into the lungs every 4 (four) hours as needed. 6.7 g 6  . Aloe Vera Leaf POWD Take 800 mg by mouth  2 (two) times daily.     . Ascorbic Acid (VITAMIN C) 1000 MG tablet Take 1,000 mg by mouth 2 (two) times daily.    Marland Kitchen b complex vitamins tablet Take 1 tablet by mouth 2 (two) times daily.     . Blood Glucose Monitoring Suppl (BLOOD GLUCOSE SYSTEM PAK) KIT Use as directed to monitor FSBS 1x daily. Dx: E11.9. Please dispense as Accu-Chek Aviva 1 each 1  . Cholecalciferol (VITAMIN D3) 2000 UNITS TABS Take 2,000 Units by mouth 2 (two) times daily.    Mariane Baumgarten Calcium (STOOL SOFTENER PO) Take 1 capsule by mouth as needed (constipation).     . Dulaglutide (TRULICITY) 8.41 YS/0.6TK SOPN INJECT 1 PEN INTO THE SKIN ONCE EVERY WEEK for  diabetes 2 mL 2  .  Echinacea 380 MG CAPS Take 1 capsule by mouth as needed (cold/flue).    . fluocinonide cream (LIDEX) 1.60 % Apply 1 application topically 2 (two) times daily as needed (poison ivy/bites/hives). 30 g 11  . Ginkgo Biloba 120 MG CAPS Take 1 capsule by mouth daily.     Marland Kitchen glipiZIDE (GLUCOTROL) 10 MG tablet Take 0.5 tablets (5 mg total) by mouth 2 (two) times daily before a meal. For diabetes 180 tablet 0  . Glucose Blood (BLOOD GLUCOSE TEST STRIPS) STRP Use as directed to monitor FSBS 1x daily. Dx: E11.9. Please dispense as Accu-Chek Aviva 100 each 1  . KRILL OIL PO Take 1 capsule by mouth 2 (two) times daily.    . Lancets MISC Use as directed to monitor FSBS 1x daily. Dx: E11.9. Please dispense as Accu-Chek Aviva 100 each 1  . Melatonin 3 MG TABS Take 3 mg by mouth at bedtime as needed and may repeat dose one time if needed (sleep).     . metFORMIN (GLUCOPHAGE) 1000 MG tablet TAKE 1 TABLET BY MOUTH TWICE DAILY WITH A MEAL for diabetes 180 tablet 1  . metoprolol succinate (TOPROL-XL) 25 MG 24 hr tablet Take 12.5 mg by mouth daily.    . mometasone-formoterol (DULERA) 100-5 MCG/ACT AERO Inhale 2 puffs into the lungs 2 (two) times daily. For asthma 1 Inhaler 6  . Multiple Vitamin (MULTIVITAMIN WITH MINERALS) TABS tablet Take 1 tablet by mouth daily.    . NONFORMULARY OR COMPOUNDED ITEM Apply 1 application topically 2 (two) times daily as needed. Reported on 06/10/2015    . rosuvastatin (CRESTOR) 5 MG tablet TAKE 1 TABLET(5 MG) BY MOUTH DAILY for Cholesterol 90 tablet 0  . simethicone (MYLICON) 80 MG chewable tablet Chew 80 mg by mouth as needed for flatulence.     Marland Kitchen tiZANidine (ZANAFLEX) 4 MG tablet Take 2 mg by mouth every 6 (six) hours as needed for muscle spasms.    Marland Kitchen torsemide (DEMADEX) 20 MG tablet Take 10 mg by mouth daily.    Marland Kitchen triamcinolone cream (KENALOG) 0.1 % Apply 1 application topically 2 (two) times daily. 30 g 0   No current facility-administered medications on file prior to visit.     Allergies  Allergen Reactions  . Arimidex [Anastrozole] Other (See Comments)    Arthralgias and myalgias.  Improved with 3 week hiatus from drug.  Categorical side effect of drug class.  . Adhesive [Tape] Other (See Comments)    "red and blistered"  . Lisinopril     Dry hacking cough and tickle  . Tetanus Toxoids   . Tetracyclines & Related Other (See Comments)    unknown   Social History   Socioeconomic History  .  Marital status: Married    Spouse name: Not on file  . Number of children: Not on file  . Years of education: Not on file  . Highest education level: Not on file  Occupational History  . Occupation: retired     Comment: Product manager for [employer]  Social Needs  . Financial resource strain: Not on file  . Food insecurity:    Worry: Not on file    Inability: Not on file  . Transportation needs:    Medical: Not on file    Non-medical: Not on file  Tobacco Use  . Smoking status: Never Smoker  . Smokeless tobacco: Never Used  Substance and Sexual Activity  . Alcohol use: Yes    Alcohol/week: 0.0 standard drinks    Comment: 06/22/2017 "might have a drink twice a year"  . Drug use: No  . Sexual activity: Not Currently    Birth control/protection: Post-menopausal  Lifestyle  . Physical activity:    Days per week: Not on file    Minutes per session: Not on file  . Stress: Not on file  Relationships  . Social connections:    Talks on phone: Not on file    Gets together: Not on file    Attends religious service: Not on file    Active member of club or organization: Not on file    Attends meetings of clubs or organizations: Not on file    Relationship status: Not on file  . Intimate partner violence:    Fear of current or ex partner: Not on file    Emotionally abused: Not on file    Physically abused: Not on file    Forced sexual activity: Not on file  Other Topics Concern  . Not on file  Social History Narrative  . Not on file      Review  of Systems  All other systems reviewed and are negative.      Objective:   Physical Exam  HENT:  Right Ear: Tympanic membrane, external ear and ear canal normal.  Left Ear: Tympanic membrane, external ear and ear canal normal.  Nose: Mucosal edema and rhinorrhea present. Right sinus exhibits no maxillary sinus tenderness and no frontal sinus tenderness. Left sinus exhibits maxillary sinus tenderness. Left sinus exhibits no frontal sinus tenderness.  Mouth/Throat: No oropharyngeal exudate.  Eyes: Conjunctivae are normal.  Neck: Neck supple.  Cardiovascular: Normal rate, regular rhythm and normal heart sounds.  Pulmonary/Chest: Effort normal and breath sounds normal. No respiratory distress. She has no wheezes. She has no rales.  Lymphadenopathy:    She has no cervical adenopathy.  Vitals reviewed.         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  I recommended trying Flonase 2 sprays each nostril daily coupled with Xyzal 5 mg daily and giving it a few days to see if the symptoms would improve.  If they do no further treatment is necessary.  She can continue these medications through allergy season.  If she develops a fever or worsening pain in her left maxillary sinus, then I would treat the patient with amoxicillin 875 mg p.o. twice daily for 10 days.  However I cautioned the patient to allow the allergy medication a few days to take effect to see if her symptoms would improve

## 2018-07-04 NOTE — Telephone Encounter (Signed)
Received call from patient.   States that she has been treating sinus inflammation at home x12 days with no relief. Reports that she has some sinus pressure around eye and nasal drainage. States that she has runny nose and eye drainage. States that nasal drainage is clear to yellow in color.  Appointment scheduled.

## 2018-07-04 NOTE — Progress Notes (Signed)
Remote pacemaker transmission.   

## 2018-07-06 ENCOUNTER — Encounter (HOSPITAL_COMMUNITY): Payer: Medicare Other

## 2018-07-10 NOTE — Progress Notes (Signed)
Pulmonary Individual Treatment Plan  Patient Details  Name: Julie Clay MRN: [id number] Date of Birth: 1940/08/20 Referring Provider:     PULMONARY REHAB OTHER RESP ORIENTATION from 03/15/2018 in [place]  Referring Provider  Caryl Comes      Initial Encounter Date:    PULMONARY REHAB OTHER RESP ORIENTATION from 03/15/2018 in [place]  Date  03/15/18      Visit Diagnosis: Chronic diastolic CHF (congestive heart failure) ([place])  Patient's Home Medications on Admission:   Current Outpatient Medications:  .  acetaminophen (TYLENOL) 500 MG tablet, Take 500 mg by mouth as needed for moderate pain. Takes 2 when needed, Disp: , Rfl:  .  albuterol (PROVENTIL HFA;VENTOLIN HFA) 108 (90 Base) MCG/ACT inhaler, Inhale 2 puffs into the lungs every 4 (four) hours as needed., Disp: 6.7 g, Rfl: 6 .  Aloe Vera Leaf POWD, Take 800 mg by mouth 2 (two) times daily. , Disp: , Rfl:  .  amoxicillin (AMOXIL) 875 MG tablet, Take 1 tablet (875 mg total) by mouth 2 (two) times daily., Disp: 20 tablet, Rfl: 0 .  Ascorbic Acid (VITAMIN C) 1000 MG tablet, Take 1,000 mg by mouth 2 (two) times daily., Disp: , Rfl:  .  b complex vitamins tablet, Take 1 tablet by mouth 2 (two) times daily. , Disp: , Rfl:  .  Blood Glucose Monitoring Suppl (BLOOD GLUCOSE SYSTEM PAK) KIT, Use as directed to monitor FSBS 1x daily. Dx: E11.9. Please dispense as Accu-Chek Aviva, Disp: 1 each, Rfl: 1 .  Cholecalciferol (VITAMIN D3) 2000 UNITS TABS, Take 2,000 Units by mouth 2 (two) times daily., Disp: , Rfl:  .  Docusate Calcium (STOOL SOFTENER PO), Take 1 capsule by mouth as needed (constipation). , Disp: , Rfl:  .  Dulaglutide (TRULICITY) 1.02 VO/5.3GU SOPN, INJECT 1 PEN INTO THE SKIN ONCE EVERY WEEK for  diabetes, Disp: 2 mL, Rfl: 2 .  Echinacea 380 MG CAPS, Take 1 capsule by mouth as needed (cold/flue)., Disp: , Rfl:  .  fluocinonide cream (LIDEX) 4.40 %, Apply 1 application topically 2 (two)  times daily as needed (poison ivy/bites/hives)., Disp: 30 g, Rfl: 11 .  fluticasone (FLONASE) 50 MCG/ACT nasal spray, Place 2 sprays into both nostrils daily., Disp: 16 g, Rfl: 6 .  Ginkgo Biloba 120 MG CAPS, Take 1 capsule by mouth daily. , Disp: , Rfl:  .  glipiZIDE (GLUCOTROL) 10 MG tablet, Take 0.5 tablets (5 mg total) by mouth 2 (two) times daily before a meal. For diabetes, Disp: 180 tablet, Rfl: 0 .  Glucose Blood (BLOOD GLUCOSE TEST STRIPS) STRP, Use as directed to monitor FSBS 1x daily. Dx: E11.9. Please dispense as Accu-Chek Aviva, Disp: 100 each, Rfl: 1 .  KRILL OIL PO, Take 1 capsule by mouth 2 (two) times daily., Disp: , Rfl:  .  Lancets MISC, Use as directed to monitor FSBS 1x daily. Dx: E11.9. Please dispense as Accu-Chek Aviva, Disp: 100 each, Rfl: 1 .  levocetirizine (XYZAL) 5 MG tablet, Take 1 tablet (5 mg total) by mouth every evening., Disp: 30 tablet, Rfl: 0 .  Melatonin 3 MG TABS, Take 3 mg by mouth at bedtime as needed and may repeat dose one time if needed (sleep). , Disp: , Rfl:  .  metFORMIN (GLUCOPHAGE) 1000 MG tablet, TAKE 1 TABLET BY MOUTH TWICE DAILY WITH A MEAL for diabetes, Disp: 180 tablet, Rfl: 1 .  metoprolol succinate (TOPROL-XL) 25 MG 24 hr tablet, Take 12.5 mg by mouth  daily., Disp: , Rfl:  .  mometasone-formoterol (DULERA) 100-5 MCG/ACT AERO, Inhale 2 puffs into the lungs 2 (two) times daily. For asthma, Disp: 1 Inhaler, Rfl: 6 .  Multiple Vitamin (MULTIVITAMIN WITH MINERALS) TABS tablet, Take 1 tablet by mouth daily., Disp: , Rfl:  .  NONFORMULARY OR COMPOUNDED ITEM, Apply 1 application topically 2 (two) times daily as needed. Reported on 06/10/2015, Disp: , Rfl:  .  rosuvastatin (CRESTOR) 5 MG tablet, TAKE 1 TABLET(5 MG) BY MOUTH DAILY for Cholesterol, Disp: 90 tablet, Rfl: 0 .  simethicone (MYLICON) 80 MG chewable tablet, Chew 80 mg by mouth as needed for flatulence. , Disp: , Rfl:  .  tiZANidine (ZANAFLEX) 4 MG tablet, Take 2 mg by mouth every 6 (six) hours  as needed for muscle spasms., Disp: , Rfl:  .  torsemide (DEMADEX) 20 MG tablet, Take 10 mg by mouth daily., Disp: , Rfl:  .  triamcinolone cream (KENALOG) 0.1 %, Apply 1 application topically 2 (two) times daily., Disp: 30 g, Rfl: 0  Past Medical History: Past Medical History:  Diagnosis Date  . Asthma    spirometry (2011)- mild ventilary defect  . Bone spur    "heels, knees, left shoulder" (06/22/2017)  . Breast cancer, left breast ([place]) dx'd 03/2011  . Chronic bronchitis ([place])   . COPD (chronic obstructive pulmonary disease) ([place])   . Fatty liver 2013   enlarged  . GERD (gastroesophageal reflux disease)   . HSV (herpes simplex virus) infection   . Hyperlipidemia   . Hypertension   . Lung nodule seen on imaging study 2013  . Lymphedema    Left arm  . 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 Osteoarthritis    "all over" (06/22/2017)  . Sleep apnea    Stop Bang score of 5. Pt has had sleep study, but was shown to be negative for sleep apnea.  . Type II diabetes mellitus ([place])   . Ventral hernia, unspecified, without mention of obstruction or gangrene     Tobacco Use: Social History   Tobacco Use  Smoking Status Never Smoker  Smokeless Tobacco Never Used    Labs: Recent Review Flowsheet Data    Labs for ITP Cardiac and Pulmonary Rehab Latest Ref Rng & Units 09/20/2016 03/14/2017 07/08/2017 11/09/2017 04/21/2018   Cholestrol <200 mg/dL 146 141 139 128 146   LDLCALC mg/dL (calc) 59 68 65 56 73   LDLDIRECT mg/dL - - - - -   HDL >50 mg/dL 44(L) 47(L) 50(L) 45(L) 48(L)   Trlycerides <150 mg/dL 214(H) 181(H) 159(H) 197(H) 169(H)   Hemoglobin A1c <5.7 % of total Hgb 7.2(H) 6.8(H) 6.5(H) 6.9(H) 6.5(H)      Capillary Blood Glucose: Lab Results  Component Value Date   GLUCAP 83 06/23/2017   GLUCAP 117 (H) 06/22/2017   GLUCAP 81 06/22/2017   GLUCAP 97 06/22/2017   GLUCAP 185 (H) 11/06/2013     Pulmonary Assessment Scores: Pulmonary Assessment Scores    Row Name  03/15/18 1437         ADL UCSD   ADL Phase  Entry     SOB Score total  69     Rest  0     Walk  11     Stairs  5     Bath  2     Dress  2     Shop  2       CAT Score   CAT  Score  18       mMRC Score   mMRC Score  3        Pulmonary Function Assessment:   Exercise Target Goals: Exercise Program Goal: Individual exercise prescription set using results from initial 6 min walk test and THRR while considering  patient's activity barriers and safety.   Exercise Prescription Goal: Initial exercise prescription builds to 30-45 minutes a day of aerobic activity, 2-3 days per week.  Home exercise guidelines will be given to patient during program as part of exercise prescription that the participant will acknowledge.  Activity Barriers & Risk Stratification: Activity Barriers & Cardiac Risk Stratification - 03/15/18 1351      Activity Barriers & Cardiac Risk Stratification   Activity Barriers  Arthritis;Back Problems;Shortness of Breath;Deconditioning;Other (comment)    Comments  spurs in both heels and knees    Cardiac Risk Stratification  High       6 Minute Walk: 6 Minute Walk    Row Name 03/15/18 1350         6 Minute Walk   Phase  Initial     Distance  900 feet     Walk Time  6 minutes     # of Rest Breaks  2     MPH  1.7     METS  2.31     RPE  14     Perceived Dyspnea   14     VO2 Peak  5.49     Symptoms  Yes (comment)     Comments  SOB     Resting HR  93 bpm     Resting BP  122/68     Resting Oxygen Saturation   98 %     Exercise Oxygen Saturation  during 6 min walk  95 %     Max Ex. HR  110 bpm     Max Ex. BP  144/64     2 Minute Post BP  126/62        Oxygen Initial Assessment: Oxygen Initial Assessment - 03/15/18 1436      Home Oxygen   Home Oxygen Device  None    Sleep Oxygen Prescription  None    Home Exercise Oxygen Prescription  None    Home at Rest Exercise Oxygen Prescription  None      Initial 6 min Walk   Oxygen Used  None       Program Oxygen Prescription   Program Oxygen Prescription  None       Oxygen Re-Evaluation: Oxygen Re-Evaluation    Row Name 04/07/18 1313 04/27/18 1600 05/25/18 1407 06/22/18 1439 07/10/18 1551     Program Oxygen Prescription   Program Oxygen Prescription  None  None  None  None  None     Home Oxygen   Home Oxygen Device  None  None  None  None  None   Sleep Oxygen Prescription  None  None  None  None  None   Home Exercise Oxygen Prescription  None  None  None  None  None   Home at Rest Exercise Oxygen Prescription  None  None  None  None  None   Compliance with Home Oxygen Use  -  -  -  -  - N/A     Goals/Expected Outcomes   Short Term Goals  To learn and understand importance of monitoring SPO2 with pulse oximeter and demonstrate accurate use of the pulse oximeter.;To learn and understand importance  of maintaining oxygen saturations>88%;To learn and demonstrate proper pursed lip breathing techniques or other breathing techniques.  To learn and understand importance of monitoring SPO2 with pulse oximeter and demonstrate accurate use of the pulse oximeter.;To learn and understand importance of maintaining oxygen saturations>88%;To learn and demonstrate proper pursed lip breathing techniques or other breathing techniques.  To learn and understand importance of monitoring SPO2 with pulse oximeter and demonstrate accurate use of the pulse oximeter.;To learn and understand importance of maintaining oxygen saturations>88%;To learn and demonstrate proper pursed lip breathing techniques or other breathing techniques.  To learn and understand importance of monitoring SPO2 with pulse oximeter and demonstrate accurate use of the pulse oximeter.;To learn and understand importance of maintaining oxygen saturations>88%;To learn and demonstrate proper pursed lip breathing techniques or other breathing techniques.  To learn and understand importance of monitoring SPO2 with pulse oximeter and demonstrate  accurate use of the pulse oximeter.;To learn and understand importance of maintaining oxygen saturations>88%;To learn and demonstrate proper pursed lip breathing techniques or other breathing techniques.   Long  Term Goals  Verbalizes importance of monitoring SPO2 with pulse oximeter and return demonstration;Maintenance of O2 saturations>88%;Exhibits proper breathing techniques, such as pursed lip breathing or other method taught during program session  Verbalizes importance of monitoring SPO2 with pulse oximeter and return demonstration;Maintenance of O2 saturations>88%;Exhibits proper breathing techniques, such as pursed lip breathing or other method taught during program session  Verbalizes importance of monitoring SPO2 with pulse oximeter and return demonstration;Maintenance of O2 saturations>88%;Exhibits proper breathing techniques, such as pursed lip breathing or other method taught during program session  Verbalizes importance of monitoring SPO2 with pulse oximeter and return demonstration;Maintenance of O2 saturations>88%;Exhibits proper breathing techniques, such as pursed lip breathing or other method taught during program session  Verbalizes importance of monitoring SPO2 with pulse oximeter and return demonstration;Maintenance of O2 saturations>88%;Exhibits proper breathing techniques, such as pursed lip breathing or other method taught during program session   Comments  -  Patient is able to use pulse oximeter properly with return demonstration and she is able to verbalize the importance of maintaining her O2Sat >88%.   Patient is able to use pulse oximeter properly with return demonstration and she is able to verbalize the importance of maintaining her O2Sat >88%.   Patient is able to use pulse oximeter properly with return demonstration and she is able to verbalize the importance of maintaining her O2Sat >88%.   Patient is able to use pulse oximeter properly with return demonstration and she is able  to verbalize the importance of maintaining her O2Sat >88%.    Goals/Expected Outcomes  -  Patient will continue to meet her short and long term goals.   Patient will continue to meet her short and long term goals.   Patient will continue to meet her short and long term goals.   Patient will continue to meet her short and long term goals.       Oxygen Discharge (Final Oxygen Re-Evaluation): Oxygen Re-Evaluation - 07/10/18 1551      Program Oxygen Prescription   Program Oxygen Prescription  None      Home Oxygen   Home Oxygen Device  None    Sleep Oxygen Prescription  None    Home Exercise Oxygen Prescription  None    Home at Rest Exercise Oxygen Prescription  None    Compliance with Home Oxygen Use  --   N/A     Goals/Expected Outcomes   Short Term Goals  To learn  and understand importance of monitoring SPO2 with pulse oximeter and demonstrate accurate use of the pulse oximeter.;To learn and understand importance of maintaining oxygen saturations>88%;To learn and demonstrate proper pursed lip breathing techniques or other breathing techniques.    Long  Term Goals  Verbalizes importance of monitoring SPO2 with pulse oximeter and return demonstration;Maintenance of O2 saturations>88%;Exhibits proper breathing techniques, such as pursed lip breathing or other method taught during program session    Comments  Patient is able to use pulse oximeter properly with return demonstration and she is able to verbalize the importance of maintaining her O2Sat >88%.     Goals/Expected Outcomes  Patient will continue to meet her short and long term goals.        Initial Exercise Prescription: Initial Exercise Prescription - 03/15/18 1400      Date of Initial Exercise RX and Referring Provider   Date  03/15/18    Referring Provider  Caryl Comes    Expected Discharge Date  06/15/18      NuStep   Level  1    SPM  60    Minutes  17    METs  1.7      Arm Ergometer   Level  1    Watts  11    RPM  50     Minutes  17    METs  1.8      Prescription Details   Frequency (times per week)  2    Duration  Progress to 30 minutes of continuous aerobic without signs/symptoms of physical distress      Intensity   THRR 40-80% of Max Heartrate  [phone]    Ratings of Perceived Exertion  11-13    Perceived Dyspnea  0-4      Progression   Progression  Continue to progress workloads to maintain intensity without signs/symptoms of physical distress.      Resistance Training   Training Prescription  Yes    Weight  1    Reps  10-15       Perform Capillary Blood Glucose checks as needed.  Exercise Prescription Changes:  Exercise Prescription Changes    Row Name 03/15/18 1400 04/06/18 1500 04/20/18 1200 05/03/18 1500 05/18/18 0700     Response to Exercise   Blood Pressure (Admit)  -  128/68  124/66  124/62  118/62   Blood Pressure (Exercise)  -  146/60  140/60  130/60  128/62   Blood Pressure (Exit)  -  112/60  116/64  118/68  122/62   Heart Rate (Admit)  -  106 bpm  83 bpm  78 bpm  90 bpm   Heart Rate (Exercise)  -  104 bpm  102 bpm  96 bpm  93 bpm   Heart Rate (Exit)  -  98 bpm  101 bpm  90 bpm  75 bpm   Oxygen Saturation (Admit)  -  96 %  96 %  97 %  97 %   Oxygen Saturation (Exercise)  -  95 %  96 %  95 %  96 %   Oxygen Saturation (Exit)  -  98 %  96 %  97 %  97 %   Rating of Perceived Exertion (Exercise)  -  _0 Perceived Dyspnea (Exercise)  -  _1 Symptoms  -  R hip pain  -  -  -   Comments  -  first two weeks of exercise!   -  -  -   Duration  -  Progress to 30 minutes of  aerobic without signs/symptoms of physical distress  Continue with 30 min of aerobic exercise without signs/symptoms of physical distress.  Continue with 30 min of aerobic exercise without signs/symptoms of physical distress.  Continue with 30 min of aerobic exercise without signs/symptoms of physical distress.   Intensity  -  THRR New [phone]  THRR unchanged  THRR unchanged  THRR  unchanged     Progression   Progression  -  Continue to progress workloads to maintain intensity without signs/symptoms of physical distress.  Continue to progress workloads to maintain intensity without signs/symptoms of physical distress.  Continue to progress workloads to maintain intensity without signs/symptoms of physical distress.  Continue to progress workloads to maintain intensity without signs/symptoms of physical distress.   Average METs  -  _0 2.1     Resistance Training   Training Prescription  -  Yes  Yes  Yes  Yes   Weight  -  _1 Reps  -  10-15  10-15  10-15  10-15     NuStep   Level  -  _2 SPM  -  80  83  81  83   Minutes  -  _3 METs  -  1.5  1.7  1.7  1.8     Arm Ergometer   Level  -  _4 Watts  -  _5 RPM  -  53  49  51  46   Minutes  -  _6 METs  -  2.5  1.9  2.3  2.4     Home Exercise Plan   Plans to continue exercise at  Home (comment)  Home (comment)  Home (comment)  Home (comment)  Home (comment)   Frequency  Add 3 additional days to program exercise sessions.  Add 3 additional days to program exercise sessions.  Add 3 additional days to program exercise sessions.  Add 3 additional days to program exercise sessions.  Add 3 additional days to program exercise sessions.   Initial Home Exercises Provided  03/15/18  03/15/18  03/15/18  03/15/18  03/15/18   Row Name 05/30/18 0800 06/14/18 1500 06/27/18 1200 07/10/18 1100       Response to Exercise   Blood Pressure (Admit)  124/60  120/64  112/64  -    Blood Pressure (Exercise)  130/60  136/70  160/78  -    Blood Pressure (Exit)  124/60  116/68  118/66  -    Heart Rate (Admit)  78 bpm  78 bpm  95 bpm  -    Heart Rate (Exercise)  95 bpm  99 bpm  103 bpm  -    Heart Rate (Exit)  96 bpm  78 bpm  97 bpm  -    Oxygen Saturation (Admit)  98 %  98 %  95 %  -    Oxygen Saturation (Exercise)  96 %  95 %  97 %  -    Oxygen Saturation (Exit)  96  %  96 %  97 %  -    Rating  of Perceived Exertion (Exercise)  _0 -    Perceived Dyspnea (Exercise)  _1 -    Comments  increase in overall MET level   -  -  - has not attended since 06/27/2018 - which was last reported.     Duration  Continue with 30 min of aerobic exercise without signs/symptoms of physical distress.  Continue with 30 min of aerobic exercise without signs/symptoms of physical distress.  Continue with 30 min of aerobic exercise without signs/symptoms of physical distress.  -    Intensity  THRR unchanged  THRR unchanged  THRR unchanged  -      Progression   Progression  Continue to progress workloads to maintain intensity without signs/symptoms of physical distress.  Continue to progress workloads to maintain intensity without signs/symptoms of physical distress.  Continue to progress workloads to maintain intensity without signs/symptoms of physical distress.  -    Average METs  2.2  2.2  2.3  -      Resistance Training   Training Prescription  Yes  Yes  Yes  -    Weight  _2 -    Reps  10-15  10-15  10-15  -      NuStep   Level  _3 -    SPM  86  88  84  -    Minutes  _4 -    METs  1._5 -      Arm Ergometer   Level  3  3  3.5  -    Watts  _6 -    RPM  51  50  45  -    Minutes  _7 -    METs  2.6  2.4  2.5  -      Home Exercise Plan   Plans to continue exercise at  Home (comment)  Home (comment)  Home (comment)  -    Frequency  Add 3 additional days to program exercise sessions.  Add 3 additional days to program exercise sessions.  Add 3 additional days to program exercise sessions.  -    Initial Home Exercises Provided  03/15/18  03/15/18  03/15/18  -       Exercise Comments:  Exercise Comments    Row Name 04/06/18 1516 04/27/18 0752 05/23/18 1204 05/30/18 0806 06/21/18 1436   Exercise Comments  Pt. has done well in the program so far. She has tolerated the exercises and increases with ease. Will  continue to progress her as tolerated.   Pt. is doing well in PR. 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 She continues to come to class willing to work hard to improve her level of SOB with activity.   Pt. is now workin gon level 3 on both the Nustep and Arm Ergometer. She now has a goal to increase her distance each session. She feels as if she is able to do more outside of rehab with less SOB.   Pt. is working hard toward her goal of 1 mile on the NuStep on level 3.   Pt. is still working hard on her goal of 1 miles on the NuStep and is very close to reaching it. She is also up to  level 3 on the Arm Ergometer.    [place] Name 07/10/18 1118           Exercise Comments  Pt. is doing great in PR. She is now on level 3 on the Nustep and level 3.5 on the Arm Ergometer. She tolerates those well while still pushing herself to get farther every time. She states feeling stronger.           Exercise Goals and Review:  Exercise Goals    Row Name 03/15/18 1351             Exercise Goals   Increase Physical Activity  Yes       Intervention  Provide advice, education, support and counseling about physical activity/exercise needs.;Develop an individualized exercise prescription for aerobic and resistive training based on initial evaluation findings, risk stratification, comorbidities and participant's personal goals.       Expected Outcomes  Short Term: Attend rehab on a regular basis to increase amount of physical activity.       Increase Strength and Stamina  Yes       Intervention  Provide advice, education, support and counseling about physical activity/exercise needs.;Develop an individualized exercise prescription for aerobic and resistive training based on initial evaluation findings, risk stratification, comorbidities and participant's personal goals.       Expected Outcomes  Short Term: Increase workloads from initial exercise prescription for resistance, speed, and  METs.       Able to understand and use rate of perceived exertion (RPE) scale  Yes       Intervention  Provide education and explanation on how to use RPE scale       Expected Outcomes  Short Term: Able to use RPE daily in rehab to express subjective intensity level;Long Term:  Able to use RPE to guide intensity level when exercising independently       Able to understand and use Dyspnea scale  Yes       Intervention  Provide education and explanation on how to use Dyspnea scale       Expected Outcomes  Short Term: Able to use Dyspnea scale daily in rehab to express subjective sense of shortness of breath during exertion;Long Term: Able to use Dyspnea scale to guide intensity level when exercising independently       Knowledge and understanding of Target Heart Rate Range (THRR)  Yes       Intervention  Provide education and explanation of THRR including how the numbers were predicted and where they are located for reference       Expected Outcomes  Short Term: Able to state/look up THRR       Able to check pulse independently  Yes       Intervention  Provide education and demonstration on how to check pulse in carotid and radial arteries.;Review the importance of being able to check your own pulse for safety during independent exercise       Expected Outcomes  Short Term: Able to explain why pulse checking is important during independent exercise;Long Term: Able to check pulse independently and accurately       Understanding of Exercise Prescription  Yes       Intervention  Provide education, explanation, and written materials on patient's individual exercise prescription       Expected Outcomes  Short Term: Able to explain program exercise prescription;Long Term: Able to explain home exercise prescription to exercise independently  Exercise Goals Re-Evaluation : Exercise Goals Re-Evaluation    Row Name 04/06/18 1514 04/27/18 0751 05/23/18 1202 06/21/18 1434 07/10/18 1117     Exercise  Goal Re-Evaluation   Exercise Goals Review  Increase Physical Activity;Increase Strength and Stamina;Able to understand and use Dyspnea scale;Able to understand and use rate of perceived exertion (RPE) scale;Knowledge and understanding of Target Heart Rate Range (THRR);Able to check pulse independently;Improve claudication pain tolerance and improve walking ability;Understanding of Exercise Prescription  Increase Physical Activity;Increase Strength and Stamina;Able to understand and use Dyspnea scale;Able to understand and use rate of perceived exertion (RPE) scale;Knowledge and understanding of Target Heart Rate Range (THRR);Able to check pulse independently;Improve claudication pain tolerance and improve walking ability;Understanding of Exercise Prescription  Increase Physical Activity;Increase Strength and Stamina;Able to understand and use Dyspnea scale;Able to understand and use rate of perceived exertion (RPE) scale;Knowledge and understanding of Target Heart Rate Range (THRR);Able to check pulse independently;Improve claudication pain tolerance and improve walking ability;Understanding of Exercise Prescription  Increase Physical Activity;Increase Strength and Stamina;Able to understand and use Dyspnea scale;Able to understand and use rate of perceived exertion (RPE) scale;Knowledge and understanding of Target Heart Rate Range (THRR);Able to check pulse independently;Improve claudication pain tolerance and improve walking ability;Understanding of Exercise Prescription  Increase Physical Activity;Increase Strength and Stamina;Able to understand and use Dyspnea scale;Able to understand and use rate of perceived exertion (RPE) scale;Knowledge and understanding of Target Heart Rate Range (THRR);Able to check pulse independently;Improve claudication pain tolerance and improve walking ability;Understanding of Exercise Prescription   Comments  Pt. has attended 7 exercise sessions so far. She has tolerated the  exercise well, other than some R hip pain that she feels started outside of rehab. We will continue to monitor and progress as we see fit.   Pt. continues to do well in the program. She works hard to push herself and increase her tolerance. We woll continue to monitor and progess as necessary.  Pt. has done well in PR. She has attended 16 session so far. We have talked about her pushing herself to work harder in order to increase her stamina for when she gets out of rehab.   Pt. continues to do well in the program. Since our talk about pushing herself she has tried her best to get at least a mile on the NuStep every session. She states this has helped her to be alble to do more at home before needing to stop and take a break.   Pt. continues to do well in the program. She still pushes herself to increase her distances on both machines every session.    Expected Outcomes  increase strength and stamina.   increase strength and stamina.   increase strength and stamina.   increase strength and stamina.   increase strength and stamina.       Discharge Exercise Prescription (Final Exercise Prescription Changes): Exercise Prescription Changes - 07/10/18 1100      Response to Exercise   Comments  --   has not attended since 06/27/2018 - which was last reported.       Nutrition:  Target Goals: Understanding of nutrition guidelines, daily intake of sodium <1591m, cholesterol <2038m calories 30% from fat and 7% or less from saturated fats, daily to have 5 or more servings of fruits and vegetables.  Biometrics: Pre Biometrics - 03/15/18 1352      Pre Biometrics   Height  _0  (1.651 m)    Weight  101.4 kg    Waist  Circumference  44.5 inches    Hip Circumference  58.5 inches    Waist to Hip Ratio  0.76 %    BMI (Calculated)  37.2    Triceps Skinfold  29 mm    % Body Fat  47.9 %    Grip Strength  16.7 kg    Flexibility  0 in    Single Leg Stand  7.38 seconds        Nutrition Therapy Plan and  Nutrition Goals: Nutrition Therapy & Goals - 07/10/18 1552      Nutrition Therapy   RD appointment deferred  Yes      Personal Nutrition Goals   Comments  Patient was encouraged to attend RD meeting. She continues to say she is eating more salads and fruits and following a diabetic diet. She says she continues to follow a diabetic diet. Will continue to monitor for progress.       Intervention Plan   Intervention  Nutrition handout(s) given to patient.       Nutrition Assessments: Nutrition Assessments - 03/15/18 1439      MEDFICTS Scores   Pre Score  53       Nutrition Goals Re-Evaluation:   Nutrition Goals Discharge (Final Nutrition Goals Re-Evaluation):   Psychosocial: Target Goals: Acknowledge presence or absence of significant depression and/or stress, maximize coping skills, provide positive support system. Participant is able to verbalize types and ability to use techniques and skills needed for reducing stress and depression.  Initial Review & Psychosocial Screening: Initial Psych Review & Screening - 03/15/18 1438      Initial Review   Current issues with  None Identified      Family Dynamics   Good Support System?  Yes      Barriers   Psychosocial barriers to participate in program  There are no identifiable barriers or psychosocial needs.      Screening Interventions   Interventions  Encouraged to exercise    Expected Outcomes  Short Term goal: Identification and review with participant of any Quality of Life or Depression concerns found by scoring the questionnaire.;Long Term goal: The participant improves quality of Life and PHQ9 Scores as seen by post scores and/or verbalization of changes       Quality of Life Scores: Quality of Life - 03/15/18 1353      Quality of Life   Select  Quality of Life      Quality of Life Scores   Health/Function Pre  22.37 %    Socioeconomic Pre  23.33 %    Psych/Spiritual Pre  22.79 %    Family Pre  22.9 %     GLOBAL Pre  22.71 %      Scores of 19 and below usually indicate a poorer quality of life in these areas.  A difference of  2-3 points is a clinically meaningful difference.  A difference of 2-3 points in the total score of the Quality of Life Index has been associated with significant improvement in overall quality of life, self-image, physical symptoms, and general health in studies assessing change in quality of life.   PHQ-9: Recent Review Flowsheet Data    Depression screen [facility] 2/9 04/25/2018 03/15/2018 11/11/2017 07/07/2017 03/15/2017   Decreased Interest 0 0 0 0 0   Down, Depressed, Hopeless 0 0 0 0 0   PHQ - 2 Score 0 0 0 0 0   Altered sleeping - 0 - - 1   Tired, decreased energy -  3 - - 1   Change in appetite - - - - 0   Feeling bad or failure about yourself  - 0 - - 0   Trouble concentrating - 0 - - 0   Moving slowly or fidgety/restless - 0 - - 0   Suicidal thoughts - 0 - - 0   PHQ-9 Score - 3 - - 2   Difficult doing work/chores - Somewhat difficult - - Not difficult at all     Interpretation of Total Score  Total Score Depression Severity:  1-4 = Minimal depression, 5-9 = Mild depression, 10-14 = Moderate depression, 15-19 = Moderately severe depression, 20-27 = Severe depression   Psychosocial Evaluation and Intervention: Psychosocial Evaluation - 03/15/18 1439      Psychosocial Evaluation & Interventions   Interventions  Encouraged to exercise with the program and follow exercise prescription    Continue Psychosocial Services   No Follow up required       Psychosocial Re-Evaluation: Psychosocial Re-Evaluation    Row Name 04/07/18 1320 04/27/18 1604 05/25/18 1414 06/22/18 1442 07/10/18 1557     Psychosocial Re-Evaluation   Current issues with  None Identified  None Identified  None Identified  None Identified  None Identified   Comments  Patient's initial QOL score was 22.71 and her PHQ-9 score was 3 with no psychosocial issues identified.   Patient's initial QOL  score was 22.71 and her PHQ-9 score was 3 with no psychosocial issues identified.   Patient's initial QOL score was 22.71 and her PHQ-9 score was 3 with no psychosocial issues identified.   Patient's initial QOL score was 22.71 and her PHQ-9 score was 3 with no psychosocial issues identified.   Patient's initial QOL score was 22.71 and her PHQ-9 score was 3 with no psychosocial issues identified.    Expected Outcomes  Patient will have no psychosocial issues identified at discharge.   Patient will have no psychosocial issues identified at discharge.   Patient will have no psychosocial issues identified at discharge.   Patient will have no psychosocial issues identified at discharge.   Patient will have no psychosocial issues identified at discharge.    Interventions  Encouraged to attend Pulmonary Rehabilitation for the exercise;Relaxation education;Stress management education  Encouraged to attend Pulmonary Rehabilitation for the exercise;Relaxation education;Stress management education  Encouraged to attend Pulmonary Rehabilitation for the exercise;Relaxation education;Stress management education  Encouraged to attend Pulmonary Rehabilitation for the exercise;Relaxation education;Stress management education  Encouraged to attend Pulmonary Rehabilitation for the exercise;Relaxation education;Stress management education   Continue Psychosocial Services   No Follow up required  No Follow up required  No Follow up required  No Follow up required  No Follow up required      Psychosocial Discharge (Final Psychosocial Re-Evaluation): Psychosocial Re-Evaluation - 07/10/18 1557      Psychosocial Re-Evaluation   Current issues with  None Identified    Comments  Patient's initial QOL score was 22.71 and her PHQ-9 score was 3 with no psychosocial issues identified.     Expected Outcomes  Patient will have no psychosocial issues identified at discharge.     Interventions  Encouraged to attend Pulmonary  Rehabilitation for the exercise;Relaxation education;Stress management education    Continue Psychosocial Services   No Follow up required        Education: Education Goals: Education classes will be provided on a weekly basis, covering required topics. Participant will state understanding/return demonstration of topics presented.  Learning Barriers/Preferences: Learning Barriers/Preferences - 03/15/18 1440  Learning Barriers/Preferences   Learning Barriers  None    Learning Preferences  Audio;Computer/Internet;Group Instruction;Individual Instruction;Pictoral;Skilled Demonstration;Verbal Instruction;Video;Written Material       Education Topics: How Lungs Work and Diseases: - Discuss the anatomy of the lungs and diseases that can affect the lungs, such as COPD.   PULMONARY REHAB OTHER RESPIRATORY from 06/22/2018 in [place]  Date  05/25/18  Educator  Wynetta Emery  Instruction Review Code  2- Demonstrated Understanding      Exercise: -Discuss the importance of exercise, FITT principles of exercise, normal and abnormal responses to exercise, and how to exercise safely.   Environmental Irritants: -Discuss types of environmental irritants and how to limit exposure to environmental irritants.   PULMONARY REHAB OTHER RESPIRATORY from 06/22/2018 in [place]  Date  06/01/18  Educator  Wynetta Emery  Instruction Review Code  2- Demonstrated Understanding      Meds/Inhalers and oxygen: - Discuss respiratory medications, definition of an inhaler and oxygen, and the proper way to use an inhaler and oxygen.   PULMONARY REHAB OTHER RESPIRATORY from 06/22/2018 in [place]  Date  06/08/18  Educator  Wynetta Emery      Energy Saving Techniques: - Discuss methods to conserve energy and decrease shortness of breath when performing activities of daily living.    Bronchial Hygiene / Breathing Techniques: - Discuss breathing  mechanics, pursed-lip breathing technique,  proper posture, effective ways to clear airways, and other functional breathing techniques   PULMONARY REHAB OTHER RESPIRATORY from 06/22/2018 in [place]  Date  03/23/18  Educator  Wynetta Emery  Instruction Review Code  2- Demonstrated Understanding      Cleaning Equipment: - Provides group verbal and written instruction about the health risks of elevated stress, cause of high stress, and healthy ways to reduce stress.   PULMONARY REHAB OTHER RESPIRATORY from 06/22/2018 in [place]  Date  03/30/18  Educator  Wynetta Emery  Instruction Review Code  2- Demonstrated Understanding      Nutrition I: Fats: - Discuss the types of cholesterol, what cholesterol does to the body, and how cholesterol levels can be controlled.   PULMONARY REHAB OTHER RESPIRATORY from 06/22/2018 in [place]  Date  04/06/18  Educator  Wynetta Emery  Instruction Review Code  2- Demonstrated Understanding      Nutrition II: Labels: -Discuss the different components of food labels and how to read food labels.   PULMONARY REHAB OTHER RESPIRATORY from 06/22/2018 in [place]  Date  04/20/18  Educator  DWynetta Emery  Instruction Review Code  2- Demonstrated Understanding      Respiratory Infections: - Discuss the signs and symptoms of respiratory infections, ways to prevent respiratory infections, and the importance of seeking medical treatment when having a respiratory infection.   PULMONARY REHAB OTHER RESPIRATORY from 06/22/2018 in [place]  Date  04/27/18  Educator  Wynetta Emery  Instruction Review Code  2- Demonstrated Understanding      Stress I: Signs and Symptoms: - Discuss the causes of stress, how stress may lead to anxiety and depression, and ways to limit stress.   PULMONARY REHAB OTHER RESPIRATORY from 06/22/2018 in [place]  Date   05/04/18  Educator  MB  Instruction Review Code  2- Demonstrated Understanding      Stress II: Relaxation: -Discuss relaxation techniques to limit stress.   PULMONARY REHAB OTHER RESPIRATORY from 06/22/2018 in [place]  Date  05/11/18  Educator  Wynetta Emery  Instruction Review Code  2- Demonstrated Understanding      Oxygen for Home/Travel: - Discuss how to prepare for travel when on oxygen and proper ways to transport and store oxygen to ensure safety.   PULMONARY REHAB OTHER RESPIRATORY from 06/22/2018 in [place]  Date  05/18/18  Educator  DJ  Instruction Review Code  2- Demonstrated Understanding      Knowledge Questionnaire Score: Knowledge Questionnaire Score - 03/15/18 1440      Knowledge Questionnaire Score   Pre Score  23/24       Core Components/Risk Factors/Patient Goals at Admission: Personal Goals and Risk Factors at Admission - 03/15/18 1440      Core Components/Risk Factors/Patient Goals on Admission    Weight Management  Weight Maintenance    Diabetes  Yes    Intervention  Provide education about signs/symptoms and action to take for hypo/hyperglycemia.;Provide education about proper nutrition, including hydration, and aerobic/resistive exercise prescription along with prescribed medications to achieve blood glucose in normal ranges: Fasting glucose 65-99 mg/dL    Expected Outcomes  Short Term: Participant verbalizes understanding of the signs/symptoms and immediate care of hyper/hypoglycemia, proper foot care and importance of medication, aerobic/resistive exercise and nutrition plan for blood glucose control.;Long Term: Attainment of HbA1C < 7%.    Personal Goal Other  Yes    Personal Goal  Get more stamina, continue to gain strength and stamina    Intervention  Attend PR 2 x week and supplement with home exercise 3 x week.     Expected Outcomes  Reach personal goals of gaining stamina       Core  Components/Risk Factors/Patient Goals Review:  Goals and Risk Factor Review    Row Name 04/07/18 1317 04/27/18 1602 05/25/18 1409 06/22/18 1440 07/10/18 1552     Core Components/Risk Factors/Patient Goals Review   Personal Goals Review  Weight Management/Obesity;Improve shortness of breath with ADL's;Diabetes Increase strength and stamina.   Weight Management/Obesity;Improve shortness of breath with ADL's;Diabetes Increase strength and stamina.   Weight Management/Obesity;Improve shortness of breath with ADL's;Diabetes Increase stength and stamina.   Weight Management/Obesity;Improve shortness of breath with ADL's;Diabetes Increase strength and stamina.   Weight Management/Obesity;Improve shortness of breath with ADL's;Diabetes Increase strength and stamina.    Review  Patient has completed 7 sessions. She is doing well in the program with progression. Her last A1C on file was 11/09/17 at 6.9. Her fasting reported glucose readings are usually >200. She has advised on conpliance to a diabetic diet. She says she does not feel any stronger but the program has helped her "get moving". She hopes to meet her goals as she continues the program.   Patient has completed 10 sessions maintaining her weight since last 30 day review. Her last A1C was in January at 6.5 mg/dl. Her fasting glucose readings are usually <150 mg/dl. She says she feels stonger and is moving more now doing more activities. 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 She is pleased with her progress in the program so far. Will continue to monitor for progress.   Patient has completed 18 sessions gaining 2 lbs since last 30 day review. She is doing well in the program with progression. Her last reported A1C was 6.5 mg/dl on 04/21/2018. Her reported fasting glucose readings have improved since she started the program. She is not usually >120. She states she has more energy and is moving more now. She is volunteering for  Hospice without  diffculty. Will continue to monitor for progress.   Patient has completed 25 sessions losing 1 lb since last 30 day review. She continues to do well in the program with progression. Her last A1C was 04/21/18 at 6.5 mg/dl. Her reported fasting glucose readings continue to average 125. 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 Will continue to monitor for progress.   Patient has completed 26 sessions maintaining her weight since last 30 day review. She continues to do well in the program with progression. No recent A1C's on file. Her reported fasting glucose readings average 130 mg/dl. She says her strength and stamina have improved and she feels the program has helped her improve overall. She also reports breathing better. Outpatient pulmonary rehab services was suspended 07/03/18 due to COVID-19 restrictions. Will continue to monitor.    Expected Outcomes  Patient will continue to attend sessions and complete the program meeting her personal goals.   Patient will continue to attend sessions and complete the program meeting her personal goals.   Patient will continue to attend sessions and complete the program meeting her personal goals.   Patient will continue to attend sessions and complete the program meeting her personal goals.   Patient will continue to attend sessions and complete the program meeting her personal goals.       Core Components/Risk Factors/Patient Goals at Discharge (Final Review):  Goals and Risk Factor Review - 07/10/18 1552      Core Components/Risk Factors/Patient Goals Review   Personal Goals Review  Weight Management/Obesity;Improve shortness of breath with ADL's;Diabetes   Increase strength and stamina.    Review  Patient has completed 26 sessions maintaining her weight since last 30 day review. She continues to do well in the program with progression. No recent A1C's on file. Her reported fasting glucose  readings average 130 mg/dl. She says her strength and stamina have improved and she feels the program has helped her improve overall. She also reports breathing better. Outpatient pulmonary rehab services was suspended 07/03/18 due to COVID-19 restrictions. Will continue to monitor.     Expected Outcomes  Patient will continue to attend sessions and complete the program meeting her personal goals.        ITP Comments: ITP Comments    Row Name 03/15/18 1435 03/20/18 1504 07/10/18 1550       ITP Comments  Mrs. Rauls is a pleasant 78 year old female that has CHF. 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   Patient is new to program. She plans to start 03/21/18. Will continue to monitor for progress.   Pulmonary rehab services was suspended 07/03/18 due to COVID-19 restrictions. Services will resume when restrictions are lifted.         Comments: ITP REVIEW Pt is making expected progress toward pulmonary rehab goals after completing 26 sessions. Recommend continued exercise, life style modification, education, and utilization of breathing techniques to increase stamina and strength and decrease shortness of breath with exertion.  Pulmonary rehab services was suspended 07/03/18 due to COVID-19 restrictions. Services will resume when restrictions are lifted. Will continue to monitor.

## 2018-07-11 ENCOUNTER — Encounter (HOSPITAL_COMMUNITY): Payer: Medicare Other

## 2018-07-12 ENCOUNTER — Ambulatory Visit: Payer: Medicare Other | Admitting: Cardiology

## 2018-07-13 ENCOUNTER — Encounter (HOSPITAL_COMMUNITY): Payer: Medicare Other

## 2018-07-18 ENCOUNTER — Encounter (HOSPITAL_COMMUNITY): Payer: Medicare Other

## 2018-07-18 ENCOUNTER — Other Ambulatory Visit: Payer: Self-pay | Admitting: *Deleted

## 2018-07-18 MED ORDER — DULAGLUTIDE 0.75 MG/0.5ML ~~LOC~~ SOAJ: SUBCUTANEOUS | 2 refills | Status: DC

## 2018-07-18 MED ORDER — MOMETASONE FURO-FORMOTEROL FUM 100-5 MCG/ACT IN AERO: 2.0000 | INHALATION_SPRAY | Freq: Two times a day (BID) | RESPIRATORY_TRACT | 6 refills | Status: DC

## 2018-07-20 ENCOUNTER — Encounter (HOSPITAL_COMMUNITY): Payer: Medicare Other

## 2018-07-31 ENCOUNTER — Telehealth (HOSPITAL_COMMUNITY): Payer: Self-pay | Admitting: *Deleted

## 2018-07-31 NOTE — Telephone Encounter (Signed)
Called patient to verify e-mail address and to check to see if patient is still exercising. Let patient know that we will be sending an exercise video and other informational videos. Patient stated she is doing very little exercise. Will follow-up with patient.

## 2018-08-02 ENCOUNTER — Other Ambulatory Visit: Payer: Self-pay | Admitting: *Deleted

## 2018-08-02 MED ORDER — TIZANIDINE HCL 4 MG PO TABS: 2.0000 mg | ORAL_TABLET | Freq: Four times a day (QID) | ORAL | 1 refills | Status: DC | PRN

## 2018-08-02 MED ORDER — GLIPIZIDE 10 MG PO TABS: 5.0000 mg | ORAL_TABLET | Freq: Two times a day (BID) | ORAL | 0 refills | Status: DC

## 2018-08-02 NOTE — Telephone Encounter (Signed)
Received call from patient.   Requested refill on Glipizide and Zanaflex.   Prescription sent to pharmacy for Glipizide.   Ok to refill Zanaflex?

## 2018-08-14 ENCOUNTER — Other Ambulatory Visit: Payer: Self-pay | Admitting: *Deleted

## 2018-08-14 ENCOUNTER — Telehealth: Payer: Self-pay | Admitting: Family Medicine

## 2018-08-14 MED ORDER — FLUOCINONIDE 0.05 % EX CREA: 1.0000 "application " | TOPICAL_CREAM | Freq: Two times a day (BID) | CUTANEOUS | 11 refills | Status: DC | PRN

## 2018-08-14 MED ORDER — PREDNISONE 10 MG PO TABS: ORAL_TABLET | ORAL | 0 refills | Status: DC

## 2018-08-14 MED ORDER — METFORMIN HCL 1000 MG PO TABS: ORAL_TABLET | ORAL | 1 refills | Status: DC

## 2018-08-14 NOTE — Telephone Encounter (Signed)
Julie Clay  Patient has poison oak and would like to know if prednisone can be called in  [phone]

## 2018-08-14 NOTE — Telephone Encounter (Signed)
Prescription sent to pharmacy.

## 2018-08-14 NOTE — Telephone Encounter (Signed)
Okay to send prednisone taper

## 2018-08-14 NOTE — Telephone Encounter (Signed)
Call placed to patient to inquire.   Reports that her dogs got into some poison ivy. She now has dermatitis on arms and upper thighs. States that she seems to get this yearly, and it will spread to neck and face next.   Requested prednisone taper. MD please advise.

## 2018-08-20 DIAGNOSIS — W19XXXA Unspecified fall, initial encounter: Secondary | ICD-10-CM | POA: Diagnosis not present

## 2018-08-20 DIAGNOSIS — E1165 Type 2 diabetes mellitus with hyperglycemia: Secondary | ICD-10-CM | POA: Diagnosis not present

## 2018-08-25 ENCOUNTER — Ambulatory Visit (INDEPENDENT_AMBULATORY_CARE_PROVIDER_SITE_OTHER): Payer: Medicare Other | Admitting: Family Medicine

## 2018-08-25 ENCOUNTER — Other Ambulatory Visit: Payer: Self-pay

## 2018-08-25 ENCOUNTER — Encounter: Payer: Self-pay | Admitting: Family Medicine

## 2018-08-25 DIAGNOSIS — E782 Mixed hyperlipidemia: Secondary | ICD-10-CM

## 2018-08-25 DIAGNOSIS — I10 Essential (primary) hypertension: Secondary | ICD-10-CM

## 2018-08-25 DIAGNOSIS — N182 Chronic kidney disease, stage 2 (mild): Secondary | ICD-10-CM

## 2018-08-25 DIAGNOSIS — E1143 Type 2 diabetes mellitus with diabetic autonomic (poly)neuropathy: Secondary | ICD-10-CM | POA: Diagnosis not present

## 2018-08-25 NOTE — Progress Notes (Signed)
Virtual Visit via Telephone Note  I connected with Julie Clay on 08/25/18 at 11:48AM by telephone and verified that I am speaking with the correct person using two identifiers.      Pt location: at home   Physician location:  At home [place], [name] MD     On call: patient and physician   I discussed the limitations, risks, security and privacy concerns of performing an evaluation and management service by telephone and the availability of in person appointments. I also discussed with the patient that there may be a patient responsible charge related to this service. The patient expressed understanding and agreed to proceed.   History of Present Illness:    Weight 218lbs, BP 144/80  Pulse 86 OXYGEN 98%, Temp 98.7  Telephone visit to f/u chronic medical problems AND Medication review    DM- last A1C 6.5% in Jan, taking trulicity 0.75mg  weekly, metformin and glipizide, CBG range highest 280 but this after taking prednisone    HTN-  BP at home range has been 120-140/80's    Hyperlipidemia with chronic elevated TG- last LDL 73 in Jan, TG 169 which was improved from 197 , she is on Crestor 5mg     Recent poison ivy dermatitis- treated with oral prednisone has cleared mostly, she has one tiny spot left using natural topical which has helped the residual area  Asthma no difficulties has Dulera   She had a fall last week, fire department came to help her out of the floor, does not feel any increased pain or injury  CHF with Pacer- has appt in June, has EP appt now yearly      Observations/Objective: Telephone visit, NAD   Assessment and Plan: 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 The patient was provided an opportunity to ask questions and all were answered. The patient agreed with the plan and demonstrated an understanding of the instructions.   The patient was advised to call back or seek an in-person evaluation if the symptoms worsen or if the condition fails to improve as anticipated.  I provided 8 minutes of non-face-to-face time during this encounter. End time: 11:56  [name], MD

## 2018-09-12 ENCOUNTER — Other Ambulatory Visit: Payer: Self-pay

## 2018-09-12 ENCOUNTER — Other Ambulatory Visit: Payer: Medicare Other

## 2018-09-12 DIAGNOSIS — E782 Mixed hyperlipidemia: Secondary | ICD-10-CM | POA: Diagnosis not present

## 2018-09-12 DIAGNOSIS — E1143 Type 2 diabetes mellitus with diabetic autonomic (poly)neuropathy: Secondary | ICD-10-CM | POA: Diagnosis not present

## 2018-09-12 DIAGNOSIS — I10 Essential (primary) hypertension: Secondary | ICD-10-CM | POA: Diagnosis not present

## 2018-09-13 LAB — COMPREHENSIVE METABOLIC PANEL
AG Ratio: 1.5 (calc) (ref 1.0–2.5)
ALT: 12 U/L (ref 6–29)
AST: 18 U/L (ref 10–35)
Albumin: 3.9 g/dL (ref 3.6–5.1)
Alkaline phosphatase (APISO): 46 U/L (ref 37–153)
BUN/Creatinine Ratio: 18 (calc) (ref 6–22)
BUN: 18 mg/dL (ref 7–25)
CO2: 30 mmol/L (ref 20–32)
Calcium: 9.8 mg/dL (ref 8.6–10.4)
Chloride: 105 mmol/L (ref 98–110)
Creat: 1.01 mg/dL — ABNORMAL HIGH (ref 0.60–0.93)
Globulin: 2.6 g/dL (calc) (ref 1.9–3.7)
Glucose, Bld: 97 mg/dL (ref 65–99)
Potassium: 4.2 mmol/L (ref 3.5–5.3)
Sodium: 144 mmol/L (ref 135–146)
Total Bilirubin: 0.4 mg/dL (ref 0.2–1.2)
Total Protein: 6.5 g/dL (ref 6.1–8.1)

## 2018-09-13 LAB — CBC WITH DIFFERENTIAL/PLATELET
Absolute Monocytes: 628 cells/uL (ref 200–950)
Basophils Absolute: 26 cells/uL (ref 0–200)
Basophils Relative: 0.3 %
Eosinophils Absolute: 241 cells/uL (ref 15–500)
Eosinophils Relative: 2.8 %
HCT: 39.6 % (ref 35.0–45.0)
Hemoglobin: 12.9 g/dL (ref 11.7–15.5)
Lymphs Abs: 2537 cells/uL (ref 850–3900)
MCH: 26.8 pg — ABNORMAL LOW (ref 27.0–33.0)
MCHC: 32.6 g/dL (ref 32.0–36.0)
MCV: 82.3 fL (ref 80.0–100.0)
MPV: 11.4 fL (ref 7.5–12.5)
Monocytes Relative: 7.3 %
Neutro Abs: 5169 cells/uL (ref 1500–7800)
Neutrophils Relative %: 60.1 %
Platelets: 271 10*3/uL (ref 140–400)
RBC: 4.81 10*6/uL (ref 3.80–5.10)
RDW: 13.5 % (ref 11.0–15.0)
Total Lymphocyte: 29.5 %
WBC: 8.6 10*3/uL (ref 3.8–10.8)

## 2018-09-13 LAB — HEMOGLOBIN A1C
Hgb A1c MFr Bld: 6.4 % of total Hgb — ABNORMAL HIGH (ref <5.7)
Mean Plasma Glucose: 137 (calc)
eAG (mmol/L): 7.6 (calc)

## 2018-09-13 LAB — LIPID PANEL
Cholesterol: 137 mg/dL (ref <200)
HDL: 48 mg/dL — ABNORMAL LOW (ref >=50)
LDL Cholesterol (Calc): 60 mg/dL (calc)
Non-HDL Cholesterol (Calc): 89 mg/dL (calc) (ref <130)
Total CHOL/HDL Ratio: 2.9 (calc) (ref <5.0)
Triglycerides: 218 mg/dL — ABNORMAL HIGH (ref <150)

## 2018-09-19 ENCOUNTER — Other Ambulatory Visit: Payer: Self-pay

## 2018-09-19 ENCOUNTER — Ambulatory Visit (INDEPENDENT_AMBULATORY_CARE_PROVIDER_SITE_OTHER): Payer: Medicare Other | Admitting: Family Medicine

## 2018-09-19 DIAGNOSIS — S40021A Contusion of right upper arm, initial encounter: Secondary | ICD-10-CM

## 2018-09-19 NOTE — Progress Notes (Addendum)
Pt in office with complaints of her right arm bruised, swollen and painful to the touch where blood was drawn from her on 09/12/18 by [facility]. Arm looks bruised and some partial swelling at draw site. Pt states that she is just concerned about it and it only hurts when touched. Pt was seen by Dr. Buelah Manis.     Evaluated patient blood draw area.  She has a very small pea-sized like hematoma with bruising on the surface of the skin.  No erythema to suggest cellulitis she is able to extend and flex the arm normally.  Brachial pulses felt.  Advised her to ice at this time should resolve on its own within the next week.

## 2018-09-22 ENCOUNTER — Ambulatory Visit: Payer: Medicare Other | Admitting: Cardiovascular Disease

## 2018-09-25 ENCOUNTER — Ambulatory Visit (INDEPENDENT_AMBULATORY_CARE_PROVIDER_SITE_OTHER): Payer: Medicare Other | Admitting: *Deleted

## 2018-09-25 ENCOUNTER — Other Ambulatory Visit: Payer: Self-pay | Admitting: Internal Medicine

## 2018-09-25 ENCOUNTER — Telehealth: Payer: Self-pay | Admitting: Family Medicine

## 2018-09-25 ENCOUNTER — Telehealth (HOSPITAL_COMMUNITY): Payer: Self-pay

## 2018-09-25 DIAGNOSIS — I441 Atrioventricular block, second degree: Secondary | ICD-10-CM

## 2018-09-25 DIAGNOSIS — R001 Bradycardia, unspecified: Secondary | ICD-10-CM

## 2018-09-25 LAB — CUP PACEART REMOTE DEVICE CHECK
Battery Remaining Longevity: 100 mo
Battery Remaining Percentage: 95.5 %
Battery Voltage: 3.01 V
Brady Statistic AP VP Percent: 8.7 %
Brady Statistic AP VS Percent: 1 %
Brady Statistic AS VP Percent: 91 %
Brady Statistic AS VS Percent: 1 %
Brady Statistic RA Percent Paced: 8.6 %
Brady Statistic RV Percent Paced: 99 %
Date Time Interrogation Session: 20200608060020
Implantable Lead Implant Date: 20190306
Implantable Lead Implant Date: 20190306
Implantable Lead Location: 753859
Implantable Lead Location: 753860
Implantable Lead Model: 5076
Implantable Lead Model: 5076
Implantable Pulse Generator Implant Date: 20190306
Lead Channel Impedance Value: 340 Ohm
Lead Channel Impedance Value: 430 Ohm
Lead Channel Pacing Threshold Amplitude: 0.5 V
Lead Channel Pacing Threshold Amplitude: 0.75 V
Lead Channel Pacing Threshold Pulse Width: 0.5 ms
Lead Channel Pacing Threshold Pulse Width: 0.5 ms
Lead Channel Sensing Intrinsic Amplitude: 4 mV
Lead Channel Sensing Intrinsic Amplitude: 6.7 mV
Lead Channel Setting Pacing Amplitude: 2 V
Lead Channel Setting Pacing Amplitude: 2.5 V
Lead Channel Setting Pacing Pulse Width: 0.5 ms
Lead Channel Setting Sensing Sensitivity: 2 mV
Pulse Gen Model: 2272
Pulse Gen Serial Number: 9001027

## 2018-09-25 MED ORDER — METOPROLOL SUCCINATE ER 25 MG PO TB24: 12.5000 mg | ORAL_TABLET | Freq: Every day | ORAL | 1 refills | Status: DC

## 2018-09-25 MED ORDER — ROSUVASTATIN CALCIUM 5 MG PO TABS: ORAL_TABLET | ORAL | 0 refills | Status: DC

## 2018-09-25 NOTE — Telephone Encounter (Signed)
Called patient to check in and to see is she plans to return to the Pulmonary Rehab program when we reopen. She says she is doing well and does plan to return to the program. She has 10 sessions to complete the program. She says her husband is having CABGX3 in the next 2 to 3 weeks. I informed her we would call her when we have a reopen date. She verbalized understanding.

## 2018-09-25 NOTE — Telephone Encounter (Signed)
Patients pharmacy has changed  She needs her cholesterol med sent to [facility]

## 2018-09-25 NOTE — Telephone Encounter (Signed)
Pt's medication was sent to pt's pharmacy as requested. Confirmation received.  °

## 2018-10-03 ENCOUNTER — Other Ambulatory Visit (HOSPITAL_COMMUNITY): Payer: Self-pay | Admitting: Nurse Practitioner

## 2018-10-03 DIAGNOSIS — C50912 Malignant neoplasm of unspecified site of left female breast: Secondary | ICD-10-CM

## 2018-10-04 NOTE — Progress Notes (Signed)
Remote pacemaker transmission.   

## 2018-10-10 ENCOUNTER — Ambulatory Visit (HOSPITAL_COMMUNITY)
Admission: RE | Admit: 2018-10-10 | Discharge: 2018-10-10 | Disposition: A | Discharge status: Home / Self Care | Payer: Medicare Other | Source: Ambulatory Visit | Attending: Nurse Practitioner | Admitting: Nurse Practitioner

## 2018-10-10 ENCOUNTER — Ambulatory Visit (HOSPITAL_COMMUNITY): Payer: Medicare Other

## 2018-10-10 ENCOUNTER — Other Ambulatory Visit: Payer: Self-pay

## 2018-10-10 DIAGNOSIS — R928 Other abnormal and inconclusive findings on diagnostic imaging of breast: Secondary | ICD-10-CM | POA: Insufficient documentation

## 2018-10-10 DIAGNOSIS — C50912 Malignant neoplasm of unspecified site of left female breast: Secondary | ICD-10-CM | POA: Diagnosis not present

## 2018-10-11 ENCOUNTER — Telehealth (HOSPITAL_COMMUNITY): Payer: Self-pay | Admitting: Physical Therapy

## 2018-10-11 NOTE — Telephone Encounter (Signed)
Called pt to inform her that [facility] will be reopening next week. She is excited to return. I explained all of our new check in processes; temp checks, masks, social distancing, etc.

## 2018-10-12 ENCOUNTER — Encounter (HOSPITAL_COMMUNITY): Payer: Self-pay | Admitting: Hematology

## 2018-10-12 ENCOUNTER — Other Ambulatory Visit: Payer: Self-pay | Admitting: Family Medicine

## 2018-10-12 ENCOUNTER — Inpatient Hospital Stay (HOSPITAL_COMMUNITY): Payer: Medicare Other | Attending: Hematology | Admitting: Hematology

## 2018-10-12 ENCOUNTER — Other Ambulatory Visit: Payer: Self-pay

## 2018-10-12 VITALS — BP 142/78 | HR 90 | Temp 97.3°F | Resp 18 | Wt 223.4 lb

## 2018-10-12 DIAGNOSIS — Z7984 Long term (current) use of oral hypoglycemic drugs: Secondary | ICD-10-CM | POA: Diagnosis not present

## 2018-10-12 DIAGNOSIS — E119 Type 2 diabetes mellitus without complications: Secondary | ICD-10-CM

## 2018-10-12 DIAGNOSIS — Z79899 Other long term (current) drug therapy: Secondary | ICD-10-CM | POA: Diagnosis not present

## 2018-10-12 DIAGNOSIS — G4730 Sleep apnea, unspecified: Secondary | ICD-10-CM

## 2018-10-12 DIAGNOSIS — J449 Chronic obstructive pulmonary disease, unspecified: Secondary | ICD-10-CM

## 2018-10-12 DIAGNOSIS — C50912 Malignant neoplasm of unspecified site of left female breast: Secondary | ICD-10-CM

## 2018-10-12 DIAGNOSIS — Z950 Presence of cardiac pacemaker: Secondary | ICD-10-CM

## 2018-10-12 DIAGNOSIS — Z7951 Long term (current) use of inhaled steroids: Secondary | ICD-10-CM | POA: Diagnosis not present

## 2018-10-12 DIAGNOSIS — I10 Essential (primary) hypertension: Secondary | ICD-10-CM

## 2018-10-12 DIAGNOSIS — E785 Hyperlipidemia, unspecified: Secondary | ICD-10-CM

## 2018-10-12 DIAGNOSIS — Z170 Estrogen receptor positive status [ER+]: Secondary | ICD-10-CM

## 2018-10-12 NOTE — Assessment & Plan Note (Addendum)
1.  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 -Anastrozole from March 2013 through December 2013, discontinued secondary to arthralgias and myalgias. - Took tamoxifen for 4 years from January 2014 through January 2018. - Left breast ADH and ALH, status post lumpectomy on 09/12/2013. - Mammogram of the right breast on 02/08/2017 showed new finding and biopsy was recommended. -Mammogram and ultrasound of the right breast done on 10/04/2017 showed BI-RADS Category 4 with indeterminate hyperdense mass without ultrasound correlate.  Stereotactic biopsy was recommended and declined by patient. - We reviewed results of the bilateral diagnostic mammogram dated 10/10/2018 which shows stable upper right breast mass, compatible with a benign process.  No evidence of breast malignancy.  Left lumpectomy changes. -Physical exam today shows slight thickening in the medial aspect of the right breast.  This has been stable no other palpable masses.  No palpable adenopathy. - We will obtain bilateral screening mammogram in a year.  We will see her back after her mammogram.

## 2018-10-12 NOTE — Patient Instructions (Signed)
[facility] at [facility] Discharge Instructions  You were seen today by Dr. Katragadda. He went over your recent lab and mammogram results. He will see you back in 1 year for labs and follow up.   Thank you for choosing [facility] at [facility] to provide your oncology and hematology care.  To afford each patient quality time with our provider, please arrive at least 15 minutes before your scheduled appointment time.   If you have a lab appointment with the Cancer Center please come in thru the  Main Entrance and check in at the main information desk  You need to re-schedule your appointment should you arrive 10 or more minutes late.  We strive to give you quality time with our providers, and arriving late affects you and other patients whose appointments are after yours.  Also, if you no show three or more times for appointments you may be dismissed from the clinic at the providers discretion.     Again, thank you for choosing [facility].  Our hope is that these requests will decrease the amount of time that you wait before being seen by our physicians.       _____________________________________________________________  Should you have questions after your visit to [facility], please contact our office at [phone] between the hours of 8:00 a.m. and 4:30 p.m.  Voicemails left after 4:00 p.m. will not be returned until the following business day.  For prescription refill requests, have your pharmacy contact our office and allow 72 hours.    Cancer Center Support Programs:   > Cancer Support Group  2nd Tuesday of the month 1pm-2pm, Journey Room    

## 2018-10-12 NOTE — Progress Notes (Signed)
[place], [place] [address]   CLINIC:  Medical Oncology/Hematology  PCP:  Alycia Rossetti, MD [address] [place] [address] [phone]   REASON FOR VISIT: Follow-up for left breast cancer, and right breast abnormal findings.  CURRENT THERAPY: Observation  BRIEF ONCOLOGIC HISTORY:  Oncology History  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 ([place])  06/18/2011 - 04/18/2012 Chemotherapy   Arimidex.  D/C'd due to arthralgias and myalgias   04/19/2012 - 10/2016 Chemotherapy   Tamoxifen       CANCER STAGING: Cancer Staging 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 [facility]) Staging form: Breast, AJCC 7th Edition - Clinical: Stage I - Signed by Baird Cancer, PA-C on 03/21/2013    INTERVAL HISTORY:  Ms. Risdon 78 y.o. female returns for follow-up of left breast cancer.  She reports shortness of breath on exertion only.  Denies any new onset bone pains.  Denies any chest pains or lightheadedness.  No nausea, vomiting, diarrhea or constipation reported.  Appetite is 75%.  Energy levels are low.  Reportedly her husband had a quadruple bypass and she is helping him to recuperate.  No ER visits or hospitalizations.    REVIEW OF SYSTEMS:  Review of Systems  Respiratory: Positive for shortness of breath.   Gastrointestinal: Negative for constipation.  All other systems reviewed and are negative.    PAST MEDICAL/SURGICAL HISTORY:  Past Medical History:  Diagnosis Date  . Asthma    spirometry (2011)- mild ventilary defect  . Bone spur    "heels, knees, left shoulder" (06/22/2017)  . Breast cancer, left breast ([place]) dx'd 03/2011  . Chronic bronchitis ([place])   . COPD (chronic obstructive pulmonary disease) ([place])   . Fatty liver 2013   enlarged  . GERD (gastroesophageal reflux disease)   . HSV (herpes simplex virus) infection   . Hyperlipidemia   . Hypertension   . Lung nodule seen on imaging study 2013   . Lymphedema    Left arm  . 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 Osteoarthritis    "all over" (06/22/2017)  . Sleep apnea    Stop Bang score of 5. Pt has had sleep study, but was shown to be negative for sleep apnea.  . Type II diabetes mellitus ([place])   . Ventral hernia, unspecified, without mention of obstruction or gangrene    Past Surgical History:  Procedure Laterality Date  . BREAST BIOPSY Left 04/2011  . BREAST BIOPSY Left 06/22/13  . BREAST BIOPSY Left 09/12/2013   Procedure: BREAST BIOPSY WITH NEEDLE LOCALIZATION;  Surgeon: Jamesetta So, MD;  Location: [facility];  Service: General;  Laterality: Left;  . BREAST LUMPECTOMY Left 04/2011 X 2  . CATARACT EXTRACTION W/ INTRAOCULAR LENS  IMPLANT, BILATERAL Bilateral   . CATARACT EXTRACTION, BILATERAL     on different occassions   . [place]  . DILATION AND CURETTAGE OF UTERUS  11/06/2013  . HAND LIGAMENT RECONSTRUCTION Right ~ 2010  . HYSTEROSCOPY W/D&C N/A 11/06/2013   Procedure: DILATATION AND CURETTAGE /HYSTEROSCOPY;  Surgeon: Jonnie Kind, MD;  Location: [facility];  Service: Gynecology;  Laterality: N/A;  . INSERT / REPLACE / REMOVE PACEMAKER  06/22/2017  . LUNG LOBECTOMY Right 1988   Fungal Infection  . PACEMAKER IMPLANT N/A 06/22/2017   Procedure: PACEMAKER IMPLANT;  Surgeon: Deboraha Sprang, MD;  Location: [facility];  Service:  Cardiovascular;  Laterality: N/A;  . POLYPECTOMY N/A 11/06/2013   Procedure: POLYPECTOMY (REMOVAL ENDOMETRIAL POLYP);  Surgeon: Jonnie Kind, MD;  Location: [facility];  Service: Gynecology;  Laterality: N/A;  . RECTOCELE REPAIR N/A 11/06/2013   Procedure: POSTERIOR REPAIR (RECTOCELE);  Surgeon: Jonnie Kind, MD;  Location: [facility];  Service: Gynecology;  Laterality: N/A;  . SHOULDER ARTHROSCOPY WITH OPEN ROTATOR CUFF REPAIR Left 07/26/2012  . TONSILLECTOMY  1949  . TUBAL LIGATION       SOCIAL HISTORY:  Social History   Socioeconomic History  . Marital status: Married     Spouse name: Not on file  . Number of children: Not on file  . Years of education: Not on file  . Highest education level: Not on file  Occupational History  . Occupation: retired     Comment: Product manager for [employer]  Social Needs  . Financial resource strain: Not on file  . Food insecurity    Worry: Not on file    Inability: Not on file  . Transportation needs    Medical: Not on file    Non-medical: Not on file  Tobacco Use  . Smoking status: Never Smoker  . Smokeless tobacco: Never Used  Substance and Sexual Activity  . Alcohol use: Yes    Alcohol/week: 0.0 standard drinks    Comment: 06/22/2017 "might have a drink twice a year"  . Drug use: No  . Sexual activity: Not Currently    Birth control/protection: Post-menopausal  Lifestyle  . Physical activity    Days per week: Not on file    Minutes per session: Not on file  . Stress: Not on file  Relationships  . Social Herbalist on phone: Not on file    Gets together: Not on file    Attends religious service: Not on file    Active member of club or organization: Not on file    Attends meetings of clubs or organizations: Not on file    Relationship status: Not on file  . Intimate partner violence    Fear of current or ex partner: Not on file    Emotionally abused: Not on file    Physically abused: Not on file    Forced sexual activity: Not on file  Other Topics Concern  . Not on file  Social History Narrative  . Not on file    FAMILY HISTORY:  Family History  Problem Relation Age of Onset  . Hypertension Mother   . Heart disease Mother   . Diabetes Mother   . Kidney disease Mother        ESRD  . Cancer Mother        Bladder  . Hypertension Father   . Hyperlipidemia Father   . Heart disease Father   . Diabetes Father   . Cancer Father        Stomach Cancer  . Diabetes Sister   . Heart disease Sister   . Alzheimer's disease Paternal Aunt   . Stroke Maternal Grandfather   . Cancer  Paternal Grandfather        stomach  . Cancer Maternal Uncle        prostate  . Cancer Paternal Uncle        stomach    CURRENT MEDICATIONS:  Outpatient Encounter Medications as of 10/12/2018  Medication Sig  . acetaminophen (TYLENOL) 500 MG tablet Take 500 mg by mouth as needed for moderate pain. Takes 2  when needed  . albuterol (PROVENTIL HFA;VENTOLIN HFA) 108 (90 Base) MCG/ACT inhaler Inhale 2 puffs into the lungs every 4 (four) hours as needed.  Vanita Ingles Leaf POWD Take 800 mg by mouth 2 (two) times daily.   . Ascorbic Acid (VITAMIN C) 1000 MG tablet Take 1,000 mg by mouth 2 (two) times daily.  Marland Kitchen b complex vitamins tablet Take 1 tablet by mouth 2 (two) times daily.   . Blood Glucose Monitoring Suppl (BLOOD GLUCOSE SYSTEM PAK) KIT Use as directed to monitor FSBS 1x daily. Dx: E11.9. Please dispense as Accu-Chek Aviva  . Cholecalciferol (VITAMIN D3) 2000 UNITS TABS Take 2,000 Units by mouth 2 (two) times daily.  Mariane Baumgarten Calcium (STOOL SOFTENER PO) Take 1 capsule by mouth as needed (constipation).   . Echinacea 380 MG CAPS Take 1 capsule by mouth as needed (cold/flue).  . fluocinonide cream (LIDEX) 5.39 % Apply 1 application topically 2 (two) times daily as needed (poison ivy/bites/hives).  . fluticasone (FLONASE) 50 MCG/ACT nasal spray Place 2 sprays into both nostrils daily.  . Ginkgo Biloba 120 MG CAPS Take 1 capsule by mouth daily.   Marland Kitchen glipiZIDE (GLUCOTROL) 10 MG tablet Take 0.5 tablets (5 mg total) by mouth 2 (two) times daily before a meal. For diabetes  . Glucose Blood (BLOOD GLUCOSE TEST STRIPS) STRP Use as directed to monitor FSBS 1x daily. Dx: E11.9. Please dispense as Accu-Chek Aviva  . KRILL OIL PO Take 1 capsule by mouth 2 (two) times daily.  . Lancets MISC Use as directed to monitor FSBS 1x daily. Dx: E11.9. Please dispense as Accu-Chek Aviva  . Melatonin 3 MG TABS Take 3 mg by mouth at bedtime as needed and may repeat dose one time if needed (sleep).   . metFORMIN  (GLUCOPHAGE) 1000 MG tablet TAKE 1 TABLET BY MOUTH TWICE DAILY WITH A MEAL for diabetes  . metoprolol succinate (TOPROL-XL) 25 MG 24 hr tablet Take 0.5 tablets (12.5 mg total) by mouth daily.  . mometasone-formoterol (DULERA) 100-5 MCG/ACT AERO Inhale 2 puffs into the lungs 2 (two) times daily. For asthma  . Multiple Vitamin (MULTIVITAMIN WITH MINERALS) TABS tablet Take 1 tablet by mouth daily.  . NONFORMULARY OR COMPOUNDED ITEM Apply 1 application topically 2 (two) times daily as needed. Reported on 06/10/2015  . rosuvastatin (CRESTOR) 5 MG tablet TAKE 1 TABLET(5 MG) BY MOUTH DAILY for Cholesterol  . simethicone (MYLICON) 80 MG chewable tablet Chew 80 mg by mouth as needed for flatulence.   Marland Kitchen tiZANidine (ZANAFLEX) 4 MG tablet Take 0.5 tablets (2 mg total) by mouth every 6 (six) hours as needed for muscle spasms.  Marland Kitchen torsemide (DEMADEX) 20 MG tablet Take 10 mg by mouth daily.  Marland Kitchen triamcinolone cream (KENALOG) 0.1 % Apply 1 application topically 2 (two) times daily. (Patient taking differently: Apply 1 application topically as needed. )  . [DISCONTINUED] Dulaglutide (TRULICITY) 7.67 HA/1.9FX SOPN INJECT 1 PEN INTO THE SKIN ONCE EVERY WEEK for  diabetes  . levocetirizine (XYZAL) 5 MG tablet Take 1 tablet (5 mg total) by mouth every evening. (Patient not taking: Reported on 10/12/2018)  . predniSONE (DELTASONE) 10 MG tablet Take 73m on days 1-2. Take 348mon days 3-4. Take 2071mn days 5-6. Take 85m39m days 7-8. Take 5mg 66mdays 9-10, then stop. (Patient not taking: Reported on 10/12/2018)  . [DISCONTINUED] amoxicillin (AMOXIL) 875 MG tablet Take 1 tablet (875 mg total) by mouth 2 (two) times daily.   No facility-administered encounter medications on  file as of 10/12/2018.     ALLERGIES:  Allergies  Allergen Reactions  . Arimidex [Anastrozole] Other (See Comments)    Arthralgias and myalgias.  Improved with 3 week hiatus from drug.  Categorical side effect of drug class.  . Adhesive [Tape] Other (See  Comments)    "red and blistered"  . Lisinopril     Dry hacking cough and tickle  . Tetanus Toxoids   . Tetracyclines & Related Other (See Comments)    unknown     PHYSICAL EXAM:  ECOG Performance status: 1  Vitals:   10/12/18 1426  BP: (!) 142/78  Pulse: 90  Resp: 18  Temp: (!) 97.3 F (36.3 C)  SpO2: 97%   Filed Weights   10/12/18 1426  Weight: 223 lb 6.4 oz (101.3 kg)    Physical Exam Constitutional:      Appearance: Normal appearance. She is normal weight.  Cardiovascular:     Rate and Rhythm: Normal rate and regular rhythm.     Heart sounds: Normal heart sounds.  Pulmonary:     Effort: Pulmonary effort is normal.     Breath sounds: Normal breath sounds.  Abdominal:     General: There is no distension.     Palpations: Abdomen is soft. There is no mass.  Musculoskeletal: Normal range of motion.  Skin:    General: Skin is warm.  Neurological:     General: No focal deficit present.     Mental Status: She is alert and oriented to person, place, and time.  Psychiatric:        Mood and Affect: Mood normal.        Behavior: Behavior normal.   Breast: There is slight thickening in the medial right breast which is stable.  No palpable masses.  No palpable adenopathy.    LABORATORY DATA:  I have reviewed the labs as listed.  CBC    Component Value Date/Time   WBC 8.6 09/12/2018 1007   RBC 4.81 09/12/2018 1007   HGB 12.9 09/12/2018 1007   HCT 39.6 09/12/2018 1007   PLT 271 09/12/2018 1007   MCV 82.3 09/12/2018 1007   MCH 26.8 (L) 09/12/2018 1007   MCHC 32.6 09/12/2018 1007   RDW 13.5 09/12/2018 1007   LYMPHSABS 2,537 09/12/2018 1007   MONOABS 0.7 06/07/2017 1420   EOSABS 241 09/12/2018 1007   BASOSABS 26 09/12/2018 1007   CMP Latest Ref Rng & Units 09/12/2018 04/21/2018 11/09/2017  Glucose 65 - 99 mg/dL 97 86 109(H)  BUN 7 - 25 mg/dL 18 22 17   Creatinine 0.60 - 0.93 mg/dL 1.01(H) 0.81 0.91  Sodium 135 - 146 mmol/L 144 143 143  Potassium 3.5 - 5.3  mmol/L 4.2 4.1 3.9  Chloride 98 - 110 mmol/L 105 106 106  CO2 20 - 32 mmol/L 30 29 30   Calcium 8.6 - 10.4 mg/dL 9.8 9.4 9.4  Total Protein 6.1 - 8.1 g/dL 6.5 6.3 6.2  Total Bilirubin 0.2 - 1.2 mg/dL 0.4 0.5 0.4  Alkaline Phos 33 - 130 U/L - - -  AST 10 - 35 U/L 18 17 19   ALT 6 - 29 U/L 12 14 12        DIAGNOSTIC IMAGING:  I have independently reviewed the scans and discussed with the patient.    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 -Anastrozole  from March 2013 through December 2013, discontinued secondary to arthralgias and myalgias. - Took tamoxifen for 4 years from January 2014 through January 2018. - Left breast ADH and ALH, status post lumpectomy on 09/12/2013. - Mammogram of the right breast on 02/08/2017 showed new finding and biopsy was recommended. -Mammogram and ultrasound of the right breast done on 10/04/2017 showed BI-RADS Category 4 with indeterminate hyperdense mass without ultrasound correlate.  Stereotactic biopsy was recommended and declined by patient. - We reviewed results of the bilateral diagnostic mammogram dated 10/10/2018 which shows stable upper right breast mass, compatible with a benign process.  No evidence of breast malignancy.  Left lumpectomy changes. -Physical exam today shows slight thickening in the medial aspect of the right breast.  This has been stable no other palpable masses.  No palpable adenopathy. - We will obtain bilateral screening mammogram in a year.  We will see her back after her mammogram.      Orders placed this encounter:  Orders Placed This Encounter  Procedures  . MM 3D SCREEN BREAST BILATERAL      Derek Jack, [place] [phone]

## 2018-10-16 ENCOUNTER — Ambulatory Visit (INDEPENDENT_AMBULATORY_CARE_PROVIDER_SITE_OTHER): Payer: Medicare Other | Admitting: Cardiovascular Disease

## 2018-10-16 ENCOUNTER — Other Ambulatory Visit: Payer: Self-pay

## 2018-10-16 ENCOUNTER — Encounter: Payer: Self-pay | Admitting: Cardiovascular Disease

## 2018-10-16 VITALS — BP 143/74 | HR 84 | Temp 97.1°F | Ht 65.0 in | Wt 223.6 lb

## 2018-10-16 DIAGNOSIS — Z950 Presence of cardiac pacemaker: Secondary | ICD-10-CM

## 2018-10-16 DIAGNOSIS — I5032 Chronic diastolic (congestive) heart failure: Secondary | ICD-10-CM | POA: Diagnosis not present

## 2018-10-16 DIAGNOSIS — I10 Essential (primary) hypertension: Secondary | ICD-10-CM

## 2018-10-16 DIAGNOSIS — I441 Atrioventricular block, second degree: Secondary | ICD-10-CM

## 2018-10-16 NOTE — Progress Notes (Signed)
SUBJECTIVE: The patient presents to establish care with me in our [place] office.  This is my first time meeting her.  She was last seen by Dr. Caryl Comes in December 2019.  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  She had normal left ventricular systolic function by echocardiogram in February 2019.  She has chronic diastolic heart failure and COPD. At the time of her last appointment, her PVC interval was shortened to 200 ms.  Her husband is Holli Humbles who is also my patient.  He underwent CABG about 2 weeks ago.  She was more interested in discussing his current situation rather than hers.  She feels like she is doing fine.  She takes torsemide 10 mg every morning and about 10 mg about every other evening.  She said her husband is in a lot of pain.  He is almost out of Dilaudid.  He is tried tramadol and extra strength Tylenol without any relief.  She told me she tried contacting the surgeons office last Friday but has not heard back yet.   Review of Systems: As per "subjective", otherwise negative.  Allergies  Allergen Reactions  . Arimidex [Anastrozole] Other (See Comments)    Arthralgias and myalgias.  Improved with 3 week hiatus from drug.  Categorical side effect of drug class.  . Adhesive [Tape] Other (See Comments)    "red and blistered"  . Lisinopril     Dry hacking cough and tickle  . Tetanus Toxoids   . Tetracyclines & Related Other (See Comments)    unknown    Current Outpatient Medications  Medication Sig Dispense Refill  . acetaminophen (TYLENOL) 500 MG tablet Take 500 mg by mouth as needed for moderate pain. Takes 2 when needed    . albuterol (PROVENTIL HFA;VENTOLIN HFA) 108 (90 Base) MCG/ACT inhaler Inhale 2 puffs into the lungs every 4 (four) hours as needed. 6.7 g 6  . Aloe Vera Leaf POWD Take 800 mg by mouth 2 (two) times daily.     . Ascorbic Acid (VITAMIN C) 1000 MG tablet  Take 1,000 mg by mouth 2 (two) times daily.    Marland Kitchen b complex vitamins tablet Take 1 tablet by mouth 2 (two) times daily.     . Blood Glucose Monitoring Suppl (BLOOD GLUCOSE SYSTEM PAK) KIT Use as directed to monitor FSBS 1x daily. Dx: E11.9. Please dispense as Accu-Chek Aviva 1 each 1  . Cholecalciferol (VITAMIN D3) 2000 UNITS TABS Take 2,000 Units by mouth 2 (two) times daily.    Mariane Baumgarten Calcium (STOOL SOFTENER PO) Take 1 capsule by mouth as needed (constipation).     . Echinacea 380 MG CAPS Take 1 capsule by mouth as needed (cold/flue).    . fluocinonide cream (LIDEX) 0.01 % Apply 1 application topically 2 (two) times daily as needed (poison ivy/bites/hives). 30 g 11  . fluticasone (FLONASE) 50 MCG/ACT nasal spray Place 2 sprays into both nostrils daily. 16 g 6  . Ginkgo Biloba 120 MG CAPS Take 1 capsule by mouth daily.     Marland Kitchen glipiZIDE (GLUCOTROL) 10 MG tablet Take 0.5 tablets (5 mg total) by mouth 2 (two) times daily before a meal. For diabetes 180 tablet 0  . Glucose Blood (BLOOD GLUCOSE TEST STRIPS) STRP Use as directed to monitor FSBS 1x daily. Dx: E11.9. Please dispense as Accu-Chek Aviva 100 each 1  . KRILL OIL PO Take 1 capsule by  mouth 2 (two) times daily.    . Lancets MISC Use as directed to monitor FSBS 1x daily. Dx: E11.9. Please dispense as Accu-Chek Aviva 100 each 1  . levocetirizine (XYZAL) 5 MG tablet Take 1 tablet (5 mg total) by mouth every evening. 30 tablet 0  . Melatonin 3 MG TABS Take 3 mg by mouth at bedtime as needed and may repeat dose one time if needed (sleep).     . metFORMIN (GLUCOPHAGE) 1000 MG tablet TAKE 1 TABLET BY MOUTH TWICE DAILY WITH A MEAL for diabetes 180 tablet 1  . metoprolol succinate (TOPROL-XL) 25 MG 24 hr tablet Take 0.5 tablets (12.5 mg total) by mouth daily. 45 tablet 1  . mometasone-formoterol (DULERA) 100-5 MCG/ACT AERO Inhale 2 puffs into the lungs 2 (two) times daily. For asthma 1 Inhaler 6  . Multiple Vitamin (MULTIVITAMIN WITH MINERALS) TABS  tablet Take 1 tablet by mouth daily.    . NONFORMULARY OR COMPOUNDED ITEM Apply 1 application topically 2 (two) times daily as needed. Reported on 06/10/2015    . rosuvastatin (CRESTOR) 5 MG tablet TAKE 1 TABLET(5 MG) BY MOUTH DAILY for Cholesterol 90 tablet 0  . simethicone (MYLICON) 80 MG chewable tablet Chew 80 mg by mouth as needed for flatulence.     Marland Kitchen tiZANidine (ZANAFLEX) 4 MG tablet Take 0.5 tablets (2 mg total) by mouth every 6 (six) hours as needed for muscle spasms. 30 tablet 1  . torsemide (DEMADEX) 20 MG tablet Take 10 mg by mouth daily.    Marland Kitchen triamcinolone cream (KENALOG) 0.1 % Apply 1 application topically 2 (two) times daily. (Patient taking differently: Apply 1 application topically as needed. ) 30 g 0  . TRULICITY 9.74 BU/3.8GT SOPN INJECT 1 PEN INTO THE SKIN ONCE EVERY WEEK FOR DIABETES 4 mL 0   No current facility-administered medications for this visit.     Past Medical History:  Diagnosis Date  . Asthma    spirometry (2011)- mild ventilary defect  . Bone spur    "heels, knees, left shoulder" (06/22/2017)  . Breast cancer, left breast ([place]) dx'd 03/2011  . Chronic bronchitis ([place])   . COPD (chronic obstructive pulmonary disease) ([place])   . Fatty liver 2013   enlarged  . GERD (gastroesophageal reflux disease)   . HSV (herpes simplex virus) infection   . Hyperlipidemia   . Hypertension   . Lung nodule seen on imaging study 2013  . Lymphedema    Left arm  . 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 Osteoarthritis    "all over" (06/22/2017)  . Sleep apnea    Stop Bang score of 5. Pt has had sleep study, but was shown to be negative for sleep apnea.  . Type II diabetes mellitus ([place])   . Ventral hernia, unspecified, without mention of obstruction or gangrene     Past Surgical History:  Procedure Laterality Date  . BREAST BIOPSY Left 04/2011  . BREAST BIOPSY Left 06/22/13  . BREAST BIOPSY Left 09/12/2013   Procedure: BREAST BIOPSY WITH NEEDLE LOCALIZATION;   Surgeon: Jamesetta So, MD;  Location: [facility];  Service: General;  Laterality: Left;  . BREAST LUMPECTOMY Left 04/2011 X 2  . CATARACT EXTRACTION W/ INTRAOCULAR LENS  IMPLANT, BILATERAL Bilateral   . CATARACT EXTRACTION, BILATERAL     on different occassions   . Edith Endave  . DILATION AND CURETTAGE OF UTERUS  11/06/2013  . HAND LIGAMENT RECONSTRUCTION Right ~  2010  . HYSTEROSCOPY W/D&C N/A 11/06/2013   Procedure: DILATATION AND CURETTAGE /HYSTEROSCOPY;  Surgeon: Jonnie Kind, MD;  Location: [facility];  Service: Gynecology;  Laterality: N/A;  . INSERT / REPLACE / REMOVE PACEMAKER  06/22/2017  . LUNG LOBECTOMY Right 1988   Fungal Infection  . PACEMAKER IMPLANT N/A 06/22/2017   Procedure: PACEMAKER IMPLANT;  Surgeon: Deboraha Sprang, MD;  Location: [facility];  Service: Cardiovascular;  Laterality: N/A;  . POLYPECTOMY N/A 11/06/2013   Procedure: POLYPECTOMY (REMOVAL ENDOMETRIAL POLYP);  Surgeon: Jonnie Kind, MD;  Location: [facility];  Service: Gynecology;  Laterality: N/A;  . RECTOCELE REPAIR N/A 11/06/2013   Procedure: POSTERIOR REPAIR (RECTOCELE);  Surgeon: Jonnie Kind, MD;  Location: [facility];  Service: Gynecology;  Laterality: N/A;  . SHOULDER ARTHROSCOPY WITH OPEN ROTATOR CUFF REPAIR Left 07/26/2012  . TONSILLECTOMY  1949  . TUBAL LIGATION      Social History   Socioeconomic History  . Marital status: Married    Spouse name: Not on file  . Number of children: Not on file  . Years of education: Not on file  . Highest education level: Not on file  Occupational History  . Occupation: retired     Comment: Product manager for [employer]  Social Needs  . Financial resource strain: Not on file  . Food insecurity    Worry: Not on file    Inability: Not on file  . Transportation needs    Medical: Not on file    Non-medical: Not on file  Tobacco Use  . Smoking status: Never Smoker  . Smokeless tobacco: Never Used  Substance and Sexual Activity  . Alcohol  use: Yes    Alcohol/week: 0.0 standard drinks    Comment: 06/22/2017 "might have a drink twice a year"  . Drug use: No  . Sexual activity: Not Currently    Birth control/protection: Post-menopausal  Lifestyle  . Physical activity    Days per week: Not on file    Minutes per session: Not on file  . Stress: Not on file  Relationships  . Social Herbalist on phone: Not on file    Gets together: Not on file    Attends religious service: Not on file    Active member of club or organization: Not on file    Attends meetings of clubs or organizations: Not on file    Relationship status: Not on file  . Intimate partner violence    Fear of current or ex partner: Not on file    Emotionally abused: Not on file    Physically abused: Not on file    Forced sexual activity: Not on file  Other Topics Concern  . Not on file  Social History Narrative  . Not on file     Vitals:   10/16/18 1011  BP: (!) 143/74  Pulse: 84  Temp: (!) 97.1 F (36.2 C)  SpO2: 97%  Weight: 223 lb 9.6 oz (101.4 kg)  Height: 5' 5"  (1.651 m)    Wt Readings from Last 3 Encounters:  10/16/18 223 lb 9.6 oz (101.4 kg)  10/12/18 223 lb 6.4 oz (101.3 kg)  07/04/18 222 lb (100.7 kg)     PHYSICAL EXAM General: NAD HEENT: Normal. Neck: No JVD, no thyromegaly. Lungs: Clear to auscultation bilaterally with normal respiratory effort. CV: Regular rate and rhythm, normal S1/S2, no S3/S4, no murmur. No pretibial or periankle edema.  No carotid bruit.  Abdomen: Soft, nontender, no distention.  Neurologic: Alert and oriented.  Psych: Normal affect. Skin: Normal. Musculoskeletal: No gross deformities.    ECG: Reviewed above under Subjective   Labs: Lab Results  Component Value Date/Time   K 4.2 09/12/2018 10:07 AM   BUN 18 09/12/2018 10:07 AM   CREATININE 1.01 (H) 09/12/2018 10:07 AM   ALT 12 09/12/2018 10:07 AM   TSH 3.42 05/31/2017 12:46 PM   HGB 12.9 09/12/2018 10:07 AM     Lipids: Lab  Results  Component Value Date/Time   LDLCALC 60 09/12/2018 10:07 AM   LDLDIRECT 102 (H) 12/09/2011 10:35 AM   CHOL 137 09/12/2018 10:07 AM   TRIG 218 (H) 09/12/2018 10:07 AM   HDL 48 (L) 09/12/2018 10:07 AM       ASSESSMENT AND PLAN: 1.  Chronic diastolic heart failure: Currently on torsemide 10 mg every morning and she takes about 10 mg every other evening.  Euvolemic.  No changes to therapy.  2.  Mobitz 2 second-degree heart block and symptomatic bradycardia status post [place] cardiac pacemaker: I will arrange for follow-up in our device clinic in [place].  3.  Hypertension: Blood pressure is mildly elevated.  She is on Toprol-XL 12.5 mg daily.  No changes to therapy.    Disposition: Follow up 1 year   Kate Sable, M.D., F.A.C.C.

## 2018-10-16 NOTE — Patient Instructions (Addendum)
Medication Instructions: Your physician recommends that you continue on your current medications as directed. Please refer to the Current Medication list given to you today.   Labwork: none  Procedures/Testing: none  Follow-Up: 1 year with Physician's Assistant   Any Additional Special Instructions Will Be Listed Below (If Applicable).     If you need a refill on your cardiac medications before your next appointment, please call your pharmacy.     Thank you for choosing [facility] !

## 2018-10-18 ENCOUNTER — Telehealth: Payer: Self-pay | Admitting: Family Medicine

## 2018-10-18 MED ORDER — LORAZEPAM 0.5 MG PO TABS: 0.5000 mg | ORAL_TABLET | Freq: Two times a day (BID) | ORAL | 1 refills | Status: DC | PRN

## 2018-10-18 NOTE — Telephone Encounter (Signed)
  Patient here in the office with her husband.  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  He recently had a CABG and has been in severe pain issues sleeping.  She has been having to attend to him and her nerves are bad.  She requests Librium initially was on this 10 years ago.  Recommend that she try Lorazepam she can take a half to whole tablet up to twice a day as needed temporarily for her stress and to help her rest.  She is not to drive with this medication.

## 2018-10-24 ENCOUNTER — Encounter (HOSPITAL_COMMUNITY)
Admission: RE | Admit: 2018-10-24 | Discharge: 2018-10-24 | Disposition: A | Discharge status: Home / Self Care | Payer: Medicare Other | Source: Ambulatory Visit | Attending: Internal Medicine | Admitting: Internal Medicine

## 2018-10-24 ENCOUNTER — Other Ambulatory Visit: Payer: Self-pay

## 2018-10-24 DIAGNOSIS — I5032 Chronic diastolic (congestive) heart failure: Secondary | ICD-10-CM | POA: Diagnosis not present

## 2018-10-24 NOTE — Progress Notes (Signed)
Daily Session Note  Patient Details  Name: Julie Clay MRN: [id number] Date of Birth: Sep 04, 1940 Referring Provider:     PULMONARY REHAB OTHER RESP ORIENTATION from 03/15/2018 in [place]  Referring Provider  Caryl Comes      Encounter Date: 10/24/2018  Check In: Session Check In - 10/24/18 1045      Check-In   Supervising physician immediately available to respond to emergencies  See telemetry face sheet for immediately available ER MD    Location  [facility]    Staff Present  Benay Pike, Exercise Physiologist;Debra Wynetta Emery, RN, BSN    Virtual Visit  No    Medication changes reported      No    Fall or balance concerns reported     No    Tobacco Cessation  No Change    Warm-up and Cool-down  Performed as group-led instruction    Resistance Training Performed  Yes    VAD Patient?  No    PAD/SET Patient?  No      Pain Assessment   Currently in Pain?  No/denies    Pain Score  0-No pain    Multiple Pain Sites  No       Capillary Blood Glucose: No results found for this or any previous visit (from the past 24 hour(s)).    Social History   Tobacco Use  Smoking Status Never Smoker  Smokeless Tobacco Never Used    Goals Met:  Proper associated with RPD/PD & O2 Sat Independence with exercise equipment Using PLB without cueing & demonstrates good technique Exercise tolerated well No report of cardiac concerns or symptoms Strength training completed today  Goals Unmet:  Not Applicable  Comments: Pt able to follow exercise prescription today without complaint.  Will continue to monitor for progression. Check 1145.   Dr. Sinda Du is Medical Director for [facility] Pulmonary Rehab.

## 2018-10-26 ENCOUNTER — Encounter (HOSPITAL_COMMUNITY)
Admission: RE | Admit: 2018-10-26 | Discharge: 2018-10-26 | Disposition: A | Discharge status: Home / Self Care | Payer: Medicare Other | Source: Ambulatory Visit | Attending: Internal Medicine | Admitting: Internal Medicine

## 2018-10-26 ENCOUNTER — Other Ambulatory Visit: Payer: Self-pay

## 2018-10-26 DIAGNOSIS — I5032 Chronic diastolic (congestive) heart failure: Secondary | ICD-10-CM

## 2018-10-26 NOTE — Progress Notes (Signed)
Daily Session Note  Patient Details  Name: Julie Clay MRN: [id number] Date of Birth: 08/12/1940 Referring Provider:     PULMONARY REHAB OTHER RESP ORIENTATION from 03/15/2018 in [place]  Referring Provider  Caryl Comes      Encounter Date: 10/26/2018  Check In: Session Check In - 10/26/18 1045      Check-In   Supervising physician immediately available to respond to emergencies  See telemetry face sheet for immediately available MD    Location  [facility]    Staff Present  Benay Pike, Exercise Physiologist;Meghann Landing Wynetta Emery, RN, BSN    Virtual Visit  No    Medication changes reported      No    Fall or balance concerns reported     No    Tobacco Cessation  No Change    Warm-up and Cool-down  Performed as group-led instruction    Resistance Training Performed  Yes    VAD Patient?  No    PAD/SET Patient?  No      Pain Assessment   Currently in Pain?  No/denies    Pain Score  0-No pain    Multiple Pain Sites  No       Capillary Blood Glucose: No results found for this or any previous visit (from the past 24 hour(s)).    Social History   Tobacco Use  Smoking Status Never Smoker  Smokeless Tobacco Never Used    Goals Met:  Proper associated with RPD/PD & O2 Sat Independence with exercise equipment Improved SOB with ADL's Using PLB without cueing & demonstrates good technique Exercise tolerated well No report of cardiac concerns or symptoms Strength training completed today  Goals Unmet:  Not Applicable  Comments: Pt able to follow exercise prescription today without complaint.  Will continue to monitor for progression. Check out 1145.   Dr. Sinda Du is Medical Director for [facility] Pul[facility] Rehab.

## 2018-10-31 ENCOUNTER — Encounter (HOSPITAL_COMMUNITY)
Admission: RE | Admit: 2018-10-31 | Discharge: 2018-10-31 | Disposition: A | Discharge status: Home / Self Care | Payer: Medicare Other | Source: Ambulatory Visit | Attending: Internal Medicine | Admitting: Internal Medicine

## 2018-10-31 ENCOUNTER — Other Ambulatory Visit: Payer: Self-pay

## 2018-10-31 DIAGNOSIS — I5032 Chronic diastolic (congestive) heart failure: Secondary | ICD-10-CM | POA: Diagnosis not present

## 2018-10-31 NOTE — Progress Notes (Signed)
Daily Session Note  Patient Details  Name: Julie Clay MRN: [id number] Date of Birth: 09-05-1940 Referring Provider:     PULMONARY REHAB OTHER RESP ORIENTATION from 03/15/2018 in [place]  Referring Provider  Caryl Comes      Encounter Date: 10/31/2018  Check In: Session Check In - 10/31/18 1045      Check-In   Supervising physician immediately available to respond to emergencies  See telemetry face sheet for immediately available ER MD    Location  [facility]    Staff Present  Russella Dar, MS, EP, [facility], Exercise Physiologist;Gino Garrabrant Zachery Conch, Exercise Physiologist    Virtual Visit  No    Medication changes reported      No    Fall or balance concerns reported     No    Tobacco Cessation  No Change    Warm-up and Cool-down  Performed as group-led instruction    Resistance Training Performed  Yes    VAD Patient?  No    PAD/SET Patient?  No      Pain Assessment   Currently in Pain?  No/denies    Multiple Pain Sites  No       Capillary Blood Glucose: No results found for this or any previous visit (from the past 24 hour(s)).    Social History   Tobacco Use  Smoking Status Never Smoker  Smokeless Tobacco Never Used    Goals Met:  Proper associated with RPD/PD & O2 Sat Independence with exercise equipment Using PLB without cueing & demonstrates good technique Personal goals reviewed No report of cardiac concerns or symptoms Strength training completed today  Goals Unmet:  Not Applicable  Comments: Pt able to follow exercise prescription today without complaint.  Will continue to monitor for progression. Check out 1145.   Dr. Sinda Du is Medical Director for [facility].

## 2018-11-02 ENCOUNTER — Encounter (HOSPITAL_COMMUNITY): Payer: Medicare Other

## 2018-11-03 DIAGNOSIS — M25562 Pain in left knee: Secondary | ICD-10-CM | POA: Diagnosis not present

## 2018-11-03 DIAGNOSIS — M25551 Pain in right hip: Secondary | ICD-10-CM | POA: Diagnosis not present

## 2018-11-07 ENCOUNTER — Other Ambulatory Visit: Payer: Self-pay

## 2018-11-07 ENCOUNTER — Encounter (HOSPITAL_COMMUNITY)
Admission: RE | Admit: 2018-11-07 | Discharge: 2018-11-07 | Disposition: A | Discharge status: Home / Self Care | Payer: Medicare Other | Source: Ambulatory Visit | Attending: Internal Medicine | Admitting: Internal Medicine

## 2018-11-07 DIAGNOSIS — I5032 Chronic diastolic (congestive) heart failure: Secondary | ICD-10-CM | POA: Diagnosis not present

## 2018-11-07 NOTE — Progress Notes (Signed)
Daily Session Note  Patient Details  Name: Julie Clay MRN: [id number] Date of Birth: 12/23/1940 Referring Provider:     PULMONARY REHAB OTHER RESP ORIENTATION from 03/15/2018 in [place]  Referring Provider  Caryl Comes      Encounter Date: 11/07/2018  Check In: Session Check In - 11/07/18 1045      Check-In   Supervising physician immediately available to respond to emergencies  See telemetry face sheet for immediately available ER MD    Location  [facility]    Staff Present  Russella Dar, MS, EP, [facility], Exercise Physiologist;Percy Winterrowd Zachery Conch, Exercise Physiologist    Virtual Visit  No    Medication changes reported      No    Fall or balance concerns reported     No    Tobacco Cessation  No Change    Warm-up and Cool-down  Performed as group-led instruction    Resistance Training Performed  Yes    VAD Patient?  No    PAD/SET Patient?  No      Pain Assessment   Currently in Pain?  No/denies    Pain Score  0-No pain    Multiple Pain Sites  No       Capillary Blood Glucose: No results found for this or any previous visit (from the past 24 hour(s)).    Social History   Tobacco Use  Smoking Status Never Smoker  Smokeless Tobacco Never Used    Goals Met:  Proper associated with RPD/PD & O2 Sat Independence with exercise equipment Using PLB without cueing & demonstrates good technique Exercise tolerated well No report of cardiac concerns or symptoms Strength training completed today  Goals Unmet:  Not Applicable  Comments: Pt able to follow exercise prescription today without complaint.  Will continue to monitor for progression. Check out 1145.   Dr. Sinda Du is Medical Director for [facility] Pulmonary Rehab.

## 2018-11-07 NOTE — Progress Notes (Addendum)
Pulmonary Individual Treatment Plan  Patient Details  Name: Julie Clay MRN: [id number] Date of Birth: 16-Oct-1940 Referring Provider:     PULMONARY REHAB OTHER RESP ORIENTATION from 03/15/2018 in [place]  Referring Provider  Caryl Comes      Initial Encounter Date:    PULMONARY REHAB OTHER RESP ORIENTATION from 03/15/2018 in [place]  Date  03/15/18      Visit Diagnosis: Chronic diastolic CHF (congestive heart failure) ([place])   Patient's Home Medications on Admission:   Current Outpatient Medications:  .  acetaminophen (TYLENOL) 500 MG tablet, Take 500 mg by mouth as needed for moderate pain. Takes 2 when needed, Disp: , Rfl:  .  albuterol (PROVENTIL HFA;VENTOLIN HFA) 108 (90 Base) MCG/ACT inhaler, Inhale 2 puffs into the lungs every 4 (four) hours as needed., Disp: 6.7 g, Rfl: 6 .  Aloe Vera Leaf POWD, Take 800 mg by mouth 2 (two) times daily. , Disp: , Rfl:  .  Ascorbic Acid (VITAMIN C) 1000 MG tablet, Take 1,000 mg by mouth 2 (two) times daily., Disp: , Rfl:  .  b complex vitamins tablet, Take 1 tablet by mouth 2 (two) times daily. , Disp: , Rfl:  .  Blood Glucose Monitoring Suppl (BLOOD GLUCOSE SYSTEM PAK) KIT, Use as directed to monitor FSBS 1x daily. Dx: E11.9. Please dispense as Accu-Chek Aviva, Disp: 1 each, Rfl: 1 .  Cholecalciferol (VITAMIN D3) 2000 UNITS TABS, Take 2,000 Units by mouth 2 (two) times daily., Disp: , Rfl:  .  Docusate Calcium (STOOL SOFTENER PO), Take 1 capsule by mouth as needed (constipation). , Disp: , Rfl:  .  Echinacea 380 MG CAPS, Take 1 capsule by mouth as needed (cold/flue)., Disp: , Rfl:  .  fluocinonide cream (LIDEX) 5.37 %, Apply 1 application topically 2 (two) times daily as needed (poison ivy/bites/hives)., Disp: 30 g, Rfl: 11 .  fluticasone (FLONASE) 50 MCG/ACT nasal spray, Place 2 sprays into both nostrils daily., Disp: 16 g, Rfl: 6 .  Ginkgo Biloba 120 MG CAPS, Take 1 capsule by mouth daily. ,  Disp: , Rfl:  .  glipiZIDE (GLUCOTROL) 10 MG tablet, Take 0.5 tablets (5 mg total) by mouth 2 (two) times daily before a meal. For diabetes, Disp: 180 tablet, Rfl: 0 .  Glucose Blood (BLOOD GLUCOSE TEST STRIPS) STRP, Use as directed to monitor FSBS 1x daily. Dx: E11.9. Please dispense as Accu-Chek Aviva, Disp: 100 each, Rfl: 1 .  KRILL OIL PO, Take 1 capsule by mouth 2 (two) times daily., Disp: , Rfl:  .  Lancets MISC, Use as directed to monitor FSBS 1x daily. Dx: E11.9. Please dispense as Accu-Chek Aviva, Disp: 100 each, Rfl: 1 .  levocetirizine (XYZAL) 5 MG tablet, Take 1 tablet (5 mg total) by mouth every evening., Disp: 30 tablet, Rfl: 0 .  LORazepam (ATIVAN) 0.5 MG tablet, Take 1 tablet (0.5 mg total) by mouth 2 (two) times daily as needed for anxiety., Disp: 30 tablet, Rfl: 1 .  Melatonin 3 MG TABS, Take 3 mg by mouth at bedtime as needed and may repeat dose one time if needed (sleep). , Disp: , Rfl:  .  metFORMIN (GLUCOPHAGE) 1000 MG tablet, TAKE 1 TABLET BY MOUTH TWICE DAILY WITH A MEAL for diabetes, Disp: 180 tablet, Rfl: 1 .  metoprolol succinate (TOPROL-XL) 25 MG 24 hr tablet, Take 0.5 tablets (12.5 mg total) by mouth daily., Disp: 45 tablet, Rfl: 1 .  mometasone-formoterol (DULERA) 100-5 MCG/ACT AERO, Inhale 2 puffs  into the lungs 2 (two) times daily. For asthma, Disp: 1 Inhaler, Rfl: 6 .  Multiple Vitamin (MULTIVITAMIN WITH MINERALS) TABS tablet, Take 1 tablet by mouth daily., Disp: , Rfl:  .  NONFORMULARY OR COMPOUNDED ITEM, Apply 1 application topically 2 (two) times daily as needed. Reported on 06/10/2015, Disp: , Rfl:  .  rosuvastatin (CRESTOR) 5 MG tablet, TAKE 1 TABLET(5 MG) BY MOUTH DAILY for Cholesterol, Disp: 90 tablet, Rfl: 0 .  simethicone (MYLICON) 80 MG chewable tablet, Chew 80 mg by mouth as needed for flatulence. , Disp: , Rfl:  .  tiZANidine (ZANAFLEX) 4 MG tablet, Take 0.5 tablets (2 mg total) by mouth every 6 (six) hours as needed for muscle spasms., Disp: 30 tablet, Rfl:  1 .  torsemide (DEMADEX) 20 MG tablet, Take 10 mg by mouth daily., Disp: , Rfl:  .  triamcinolone cream (KENALOG) 0.1 %, Apply 1 application topically 2 (two) times daily. (Patient taking differently: Apply 1 application topically as needed. ), Disp: 30 g, Rfl: 0 .  TRULICITY 9.16 XI/5.0TU SOPN, INJECT 1 PEN INTO THE SKIN ONCE EVERY WEEK FOR DIABETES, Disp: 4 mL, Rfl: 0  Past Medical History: Past Medical History:  Diagnosis Date  . Asthma    spirometry (2011)- mild ventilary defect  . Bone spur    "heels, knees, left shoulder" (06/22/2017)  . Breast cancer, left breast ([place]) dx'd 03/2011  . Chronic bronchitis ([place])   . COPD (chronic obstructive pulmonary disease) ([place])   . Fatty liver 2013   enlarged  . GERD (gastroesophageal reflux disease)   . HSV (herpes simplex virus) infection   . Hyperlipidemia   . Hypertension   . Lung nodule seen on imaging study 2013  . Lymphedema    Left arm  . 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 Osteoarthritis    "all over" (06/22/2017)  . Sleep apnea    Stop Bang score of 5. Pt has had sleep study, but was shown to be negative for sleep apnea.  . Type II diabetes mellitus ([place])   . Ventral hernia, unspecified, without mention of obstruction or gangrene     Tobacco Use: Social History   Tobacco Use  Smoking Status Never Smoker  Smokeless Tobacco Never Used    Labs: Recent Review Flowsheet Data    Labs for ITP Cardiac and Pulmonary Rehab Latest Ref Rng & Units 03/14/2017 07/08/2017 11/09/2017 04/21/2018 09/12/2018   Cholestrol <200 mg/dL 141 139 128 146 137   LDLCALC mg/dL (calc) 68 65 56 73 60   LDLDIRECT mg/dL - - - - -   HDL > OR = 50 mg/dL 47(L) 50(L) 45(L) 48(L) 48(L)   Trlycerides <150 mg/dL 181(H) 159(H) 197(H) 169(H) 218(H)   Hemoglobin A1c <5.7 % of total Hgb 6.8(H) 6.5(H) 6.9(H) 6.5(H) 6.4(H)      Capillary Blood Glucose: Lab Results  Component Value Date   GLUCAP 83 06/23/2017   GLUCAP 117 (H) 06/22/2017   GLUCAP 81  06/22/2017   GLUCAP 97 06/22/2017   GLUCAP 185 (H) 11/06/2013     Pulmonary Assessment Scores:  UCSD: Self-administered rating of dyspnea associated with activities of daily living (ADLs) 6-point scale (0 = "not at all" to 5 = "maximal or unable to do because of breathlessness")  Scoring Scores range from 0 to 120.  Minimally important difference is 5 units  CAT: CAT can identify the health impairment of COPD patients and is better correlated with disease progression.  CAT has a scoring range of zero to 40. The CAT score is classified into four groups of low (less than 10), medium (10 - 20), high (21-30) and very high (31-40) based on the impact level of disease on health status. A CAT score over 10 suggests significant symptoms.  A worsening CAT score could be explained by an exacerbation, poor medication adherence, poor inhaler technique, or progression of COPD or comorbid conditions.  CAT MCID is 2 points  mMRC: mMRC (Modified Medical Research Council) Dyspnea Scale is used to assess the degree of baseline functional disability in patients of respiratory disease due to dyspnea. No minimal important difference is established. A decrease in score of 1 point or greater is considered a positive change.   Pulmonary Function Assessment:   Exercise Target Goals: Exercise Program Goal: Individual exercise prescription set using results from initial 6 min walk test and THRR while considering  patient's activity barriers and safety.   Exercise Prescription Goal: Initial exercise prescription builds to 30-45 minutes a day of aerobic activity, 2-3 days per week.  Home exercise guidelines will be given to patient during program as part of exercise prescription that the participant will acknowledge.  Activity Barriers & Risk Stratification:   6 Minute Walk:   Oxygen Initial Assessment:   Oxygen Re-Evaluation: Oxygen Re-Evaluation    Row Name 05/25/18 1407 06/22/18 1439 07/10/18 1551  11/07/18 1352       Program Oxygen Prescription   Program Oxygen Prescription  None  None  None  None      Home Oxygen   Home Oxygen Device  None  None  None  None    Sleep Oxygen Prescription  None  None  None  None    Home Exercise Oxygen Prescription  None  None  None  None    Home at Rest Exercise Oxygen Prescription  None  None  None  None    Compliance with Home Oxygen Use  -  -  - N/A  -      Goals/Expected Outcomes   Short Term Goals  To learn and understand importance of monitoring SPO2 with pulse oximeter and demonstrate accurate use of the pulse oximeter.;To learn and understand importance of maintaining oxygen saturations>88%;To learn and demonstrate proper pursed lip breathing techniques or other breathing techniques.  To learn and understand importance of monitoring SPO2 with pulse oximeter and demonstrate accurate use of the pulse oximeter.;To learn and understand importance of maintaining oxygen saturations>88%;To learn and demonstrate proper pursed lip breathing techniques or other breathing techniques.  To learn and understand importance of monitoring SPO2 with pulse oximeter and demonstrate accurate use of the pulse oximeter.;To learn and understand importance of maintaining oxygen saturations>88%;To learn and demonstrate proper pursed lip breathing techniques or other breathing techniques.  To learn and understand importance of monitoring SPO2 with pulse oximeter and demonstrate accurate use of the pulse oximeter.;To learn and understand importance of maintaining oxygen saturations>88%;To learn and demonstrate proper pursed lip breathing techniques or other breathing techniques.    Long  Term Goals  Verbalizes importance of monitoring SPO2 with pulse oximeter and return demonstration;Maintenance of O2 saturations>88%;Exhibits proper breathing techniques, such as pursed lip breathing or other method taught during program session  Verbalizes importance of monitoring SPO2 with  pulse oximeter and return demonstration;Maintenance of O2 saturations>88%;Exhibits proper breathing techniques, such as pursed lip breathing or other method taught during program session  Verbalizes importance of monitoring SPO2 with pulse oximeter and return demonstration;Maintenance of O2 saturations>88%;Exhibits  proper breathing techniques, such as pursed lip breathing or other method taught during program session  Verbalizes importance of monitoring SPO2 with pulse oximeter and return demonstration;Maintenance of O2 saturations>88%;Exhibits proper breathing techniques, such as pursed lip breathing or other method taught during program session    Comments  Patient is able to use pulse oximeter properly with return demonstration and she is able to verbalize the importance of maintaining her O2Sat >88%.   Patient is able to use pulse oximeter properly with return demonstration and she is able to verbalize the importance of maintaining her O2Sat >88%.   Patient is able to use pulse oximeter properly with return demonstration and she is able to verbalize the importance of maintaining her O2Sat >88%.   Patient is able to use pulse oximeter properly with return demonstration and she is able to verbalize the importance of maintaining her O2Sat >88%.     Goals/Expected Outcomes  Patient will continue to meet her short and long term goals.   Patient will continue to meet her short and long term goals.   Patient will continue to meet her short and long term goals.   Patient will continue to meet her short and long term goals.        Oxygen Discharge (Final Oxygen Re-Evaluation): Oxygen Re-Evaluation - 11/07/18 1352      Program Oxygen Prescription   Program Oxygen Prescription  None      Home Oxygen   Home Oxygen Device  None    Sleep Oxygen Prescription  None    Home Exercise Oxygen Prescription  None    Home at Rest Exercise Oxygen Prescription  None      Goals/Expected Outcomes   Short Term Goals  To  learn and understand importance of monitoring SPO2 with pulse oximeter and demonstrate accurate use of the pulse oximeter.;To learn and understand importance of maintaining oxygen saturations>88%;To learn and demonstrate proper pursed lip breathing techniques or other breathing techniques.    Long  Term Goals  Verbalizes importance of monitoring SPO2 with pulse oximeter and return demonstration;Maintenance of O2 saturations>88%;Exhibits proper breathing techniques, such as pursed lip breathing or other method taught during program session    Comments  Patient is able to use pulse oximeter properly with return demonstration and she is able to verbalize the importance of maintaining her O2Sat >88%.     Goals/Expected Outcomes  Patient will continue to meet her short and long term goals.        Initial Exercise Prescription:   Perform Capillary Blood Glucose checks as needed.  Exercise Prescription Changes:  Exercise Prescription Changes    Row Name 05/18/18 0700 05/30/18 0800 06/14/18 1500 06/27/18 1200 07/10/18 1100     Response to Exercise   Blood Pressure (Admit)  118/62  124/60  120/64  112/64  -   Blood Pressure (Exercise)  128/62  130/60  136/70  160/78  -   Blood Pressure (Exit)  122/62  124/60  116/68  118/66  -   Heart Rate (Admit)  90 bpm  78 bpm  78 bpm  95 bpm  -   Heart Rate (Exercise)  93 bpm  95 bpm  99 bpm  103 bpm  -   Heart Rate (Exit)  75 bpm  96 bpm  78 bpm  97 bpm  -   Oxygen Saturation (Admit)  97 %  98 %  98 %  95 %  -   Oxygen Saturation (Exercise)  96 %  96 %  95 %  97 %  -   Oxygen Saturation (Exit)  97 %  96 %  96 %  97 %  -   Rating of Perceived Exertion (Exercise)  14  13  13  12   -   Perceived Dyspnea (Exercise)  13  14  14  12   -   Comments  -  increase in overall MET level   -  -  - has not attended since 06/27/2018 - which was last reported.    Duration  Continue with 30 min of aerobic exercise without signs/symptoms of physical distress.  Continue with 30  min of aerobic exercise without signs/symptoms of physical distress.  Continue with 30 min of aerobic exercise without signs/symptoms of physical distress.  Continue with 30 min of aerobic exercise without signs/symptoms of physical distress.  -   Intensity  THRR unchanged  THRR unchanged  THRR unchanged  THRR unchanged  -     Progression   Progression  Continue to progress workloads to maintain intensity without signs/symptoms of physical distress.  Continue to progress workloads to maintain intensity without signs/symptoms of physical distress.  Continue to progress workloads to maintain intensity without signs/symptoms of physical distress.  Continue to progress workloads to maintain intensity without signs/symptoms of physical distress.  -   Average METs  2.1  2.2  2.2  2.3  -     Resistance Training   Training Prescription  Yes  Yes  Yes  Yes  -   Weight  2  3  3  3   -   Reps  10-15  10-15  10-15  10-15  -     NuStep   Level  2  3  3  3   -   SPM  83  86  88  84  -   Minutes  17  17  17  17   -   METs  1.8  1.8  2  2   -     Arm Ergometer   Level  3  3  3   3.5  -   Watts  18  22  30  19   -   RPM  46  51  50  45  -   Minutes  22  22  22  22   -   METs  2.4  2.6  2.4  2.5  -     Home Exercise Plan   Plans to continue exercise at  Home (comment)  Home (comment)  Home (comment)  Home (comment)  -   Frequency  Add 3 additional days to program exercise sessions.  Add 3 additional days to program exercise sessions.  Add 3 additional days to program exercise sessions.  Add 3 additional days to program exercise sessions.  -   Initial Home Exercises Provided  03/15/18  03/15/18  03/15/18  03/15/18  -   [place] Name 11/02/18 1300             Response to Exercise   Blood Pressure (Admit)  116/68       Blood Pressure (Exercise)  130/72       Blood Pressure (Exit)  120/72       Heart Rate (Admit)  89 bpm       Heart Rate (Exercise)  101 bpm       Heart Rate (Exit)  81 bpm       Oxygen  Saturation (Admit)  95 %  Oxygen Saturation (Exercise)  96 %       Oxygen Saturation (Exit)  97 %       Rating of Perceived Exertion (Exercise)  13       Perceived Dyspnea (Exercise)  11       Comments  returned after COVID       Duration  Continue with 30 min of aerobic exercise without signs/symptoms of physical distress.       Intensity  THRR unchanged         Progression   Progression  Continue to progress workloads to maintain intensity without signs/symptoms of physical distress.       Average METs  1.8         Resistance Training   Training Prescription  Yes       Weight  3       Reps  10-15         NuStep   Level  2       SPM  76       Minutes  17       METs  1.9         Arm Ergometer   Level  1       Watts  10       RPM  34       Minutes  22       METs  1.6         Home Exercise Plan   Plans to continue exercise at  Home (comment)       Frequency  Add 3 additional days to program exercise sessions.       Initial Home Exercises Provided  03/15/18          Exercise Comments:  Exercise Comments    Row Name 05/23/18 1204 05/30/18 0806 06/21/18 1436 07/10/18 1118 11/02/18 1313   Exercise Comments  Pt. is now workin gon level 3 on both the Nustep and Arm Ergometer. She now has a goal to increase her distance each session. She feels as if she is able to do more outside of rehab with less SOB.   Pt. is working hard toward her goal of 1 mile on the NuStep on level 3.   Pt. is still working hard on her goal of 1 miles on the NuStep and is very close to reaching it. She is also up to level 3 on the Arm Ergometer.   Pt. is doing great in PR. She is now on level 3 on the Nustep and level 3.5 on the Arm Ergometer. She tolerates those well while still pushing herself to get farther every time. She states feeling stronger.   Pt has returned since being out due to [place]. She is somewhat weak and not doing as well as she was before. We have adjusted her prescription to reflect  that and will monitor her and progress as needed.   [place] Name 11/07/18 1302           Exercise Comments  Pt. has done well in the program, she has completed 30 sessions. She has gone down to level 2 from 3 on both the NuStep and Arm Ergometer since returning from the [place] break but she is working hard to regain her strength she lost and increase her workloads.          Exercise Goals and Review:   Exercise Goals Re-Evaluation : Exercise Goals Re-Evaluation    Row Name 05/23/18 1202 06/21/18  1434 07/10/18 1117 11/07/18 1300       Exercise Goal Re-Evaluation   Exercise Goals Review  Increase Physical Activity;Increase Strength and Stamina;Able to understand and use Dyspnea scale;Able to understand and use rate of perceived exertion (RPE) scale;Knowledge and understanding of Target Heart Rate Range (THRR);Able to check pulse independently;Improve claudication pain tolerance and improve walking ability;Understanding of Exercise Prescription  Increase Physical Activity;Increase Strength and Stamina;Able to understand and use Dyspnea scale;Able to understand and use rate of perceived exertion (RPE) scale;Knowledge and understanding of Target Heart Rate Range (THRR);Able to check pulse independently;Improve claudication pain tolerance and improve walking ability;Understanding of Exercise Prescription  Increase Physical Activity;Increase Strength and Stamina;Able to understand and use Dyspnea scale;Able to understand and use rate of perceived exertion (RPE) scale;Knowledge and understanding of Target Heart Rate Range (THRR);Able to check pulse independently;Improve claudication pain tolerance and improve walking ability;Understanding of Exercise Prescription  Increase Physical Activity;Increase Strength and Stamina;Able to understand and use rate of perceived exertion (RPE) scale;Able to understand and use Dyspnea scale;Knowledge and understanding of Target Heart Rate Range (THRR);Able to check pulse  independently;Understanding of Exercise Prescription    Comments  Pt. has done well in PR. She has attended 16 session so far. We have talked about her pushing herself to work harder in order to increase her stamina for when she gets out of rehab.   Pt. continues to do well in the program. Since our talk about pushing herself she has tried her best to get at least a mile on the NuStep every session. She states this has helped her to be alble to do more at home before needing to stop and take a break.   Pt. continues to do well in the program. She still pushes herself to increase her distances on both machines every session.   Pt. has done well in Pulmonary Rehab. She says this helps her to remain active and social, which she enjoys getting out of the house.    Expected Outcomes  increase strength and stamina.   increase strength and stamina.   increase strength and stamina.   Short: increase overall activity level. Long: increase strength and stamina       Discharge Exercise Prescription (Final Exercise Prescription Changes): Exercise Prescription Changes - 11/02/18 1300      Response to Exercise   Blood Pressure (Admit)  116/68    Blood Pressure (Exercise)  130/72    Blood Pressure (Exit)  120/72    Heart Rate (Admit)  89 bpm    Heart Rate (Exercise)  101 bpm    Heart Rate (Exit)  81 bpm    Oxygen Saturation (Admit)  95 %    Oxygen Saturation (Exercise)  96 %    Oxygen Saturation (Exit)  97 %    Rating of Perceived Exertion (Exercise)  13    Perceived Dyspnea (Exercise)  11    Comments  returned after COVID    Duration  Continue with 30 min of aerobic exercise without signs/symptoms of physical distress.    Intensity  THRR unchanged      Progression   Progression  Continue to progress workloads to maintain intensity without signs/symptoms of physical distress.    Average METs  1.8      Resistance Training   Training Prescription  Yes    Weight  3    Reps  10-15      NuStep   Level   2    SPM  76  Minutes  17    METs  1.9      Arm Ergometer   Level  1    Watts  10    RPM  34    Minutes  22    METs  1.6      Home Exercise Plan   Plans to continue exercise at  Home (comment)    Frequency  Add 3 additional days to program exercise sessions.    Initial Home Exercises Provided  03/15/18       Nutrition:  Target Goals: Understanding of nutrition guidelines, daily intake of sodium <1578m, cholesterol <2056m calories 30% from fat and 7% or less from saturated fats, daily to have 5 or more servings of fruits and vegetables.  Biometrics:    Nutrition Therapy Plan and Nutrition Goals: Nutrition Therapy & Goals - 11/07/18 1353      Nutrition Therapy   RD appointment deferred  Yes      Personal Nutrition Goals   Comments  Patient continues on a diabetic diet and feels she is meeting her nutritional goals. Will continue to monitor for progress.      Intervention Plan   Intervention  Nutrition handout(s) given to patient.       Nutrition Assessments:   Nutrition Goals Re-Evaluation:   Nutrition Goals Discharge (Final Nutrition Goals Re-Evaluation):   Psychosocial: Target Goals: Acknowledge presence or absence of significant depression and/or stress, maximize coping skills, provide positive support system. Participant is able to verbalize types and ability to use techniques and skills needed for reducing stress and depression.  Initial Review & Psychosocial Screening:   Quality of Life Scores:  Scores of 19 and below usually indicate a poorer quality of life in these areas.  A difference of  2-3 points is a clinically meaningful difference.  A difference of 2-3 points in the total score of the Quality of Life Index has been associated with significant improvement in overall quality of life, self-image, physical symptoms, and general health in studies assessing change in quality of life.   PHQ-9: Recent Review Flowsheet Data    Dep[facility] screen  [facility] 04/25/2018 03/15/2018 11/11/2017 07/07/2017 03/15/2017   Decreased Interest 0 0 0 0 0   Down, Depressed, Hopeless 0 0 0 0 0   PHQ - 2 Score 0 0 0 0 0   Altered sleeping - 0 - - 1   Tired, decreased energy - 3 - - 1   Change in appetite - - - - 0   Feeling bad or failure about yourself  - 0 - - 0   Trouble concentrating - 0 - - 0   Moving slowly or fidgety/restless - 0 - - 0   Suicidal thoughts - 0 - - 0   PHQ-9 Score - 3 - - 2   Difficult doing work/chores - Somewhat difficult - - Not difficult at all     Interpretation of Total Score  Total Score Depression Severity:  1-4 = Minimal depression, 5-9 = Mild depression, 10-14 = Moderate depression, 15-19 = Moderately severe depression, 20-27 = Severe depression   Psychosocial Evaluation and Intervention:   Psychosocial Re-Evaluation: Psychosocial Re-Evaluation    Row Name 05/25/18 1414 06/22/18 1442 07/10/18 1557 11/07/18 1357       Psychosocial Re-Evaluation   Current issues with  None Identified  None Identified  None Identified  None Identified    Comments  Patient's initial QOL score was 22.71 and her PHQ-9 score was 3 with no  psychosocial issues identified.   Patient's initial QOL score was 22.71 and her PHQ-9 score was 3 with no psychosocial issues identified.   Patient's initial QOL score was 22.71 and her PHQ-9 score was 3 with no psychosocial issues identified.   Patient's initial QOL score was 22.71 and her PHQ-9 score was 3 with no psychosocial issues identified.     Expected Outcomes  Patient will have no psychosocial issues identified at discharge.   Patient will have no psychosocial issues identified at discharge.   Patient will have no psychosocial issues identified at discharge.   Patient will have no psychosocial issues identified at discharge.     Interventions  Encouraged to attend Pulmonary Rehabilitation for the exercise;Relaxation education;Stress management education  Encouraged to attend Pulmonary  Rehabilitation for the exercise;Relaxation education;Stress management education  Encouraged to attend Pulmonary Rehabilitation for the exercise;Relaxation education;Stress management education  Encouraged to attend Pulmonary Rehabilitation for the exercise;Relaxation education;Stress management education    Continue Psychosocial Services   No Follow up required  No Follow up required  No Follow up required  No Follow up required       Psychosocial Discharge (Final Psychosocial Re-Evaluation): Psychosocial Re-Evaluation - 11/07/18 1357      Psychosocial Re-Evaluation   Current issues with  None Identified    Comments  Patient's initial QOL score was 22.71 and her PHQ-9 score was 3 with no psychosocial issues identified.     Expected Outcomes  Patient will have no psychosocial issues identified at discharge.     Interventions  Encouraged to attend Pulmonary Rehabilitation for the exercise;Relaxation education;Stress management education    Continue Psychosocial Services   No Follow up required        Education: Education Goals: Education classes will be provided on a weekly basis, covering required topics. Participant will state understanding/return demonstration of topics presented.  Learning Barriers/Preferences:   Education Topics: How Lungs Work and Diseases: - Discuss the anatomy of the lungs and diseases that can affect the lungs, such as COPD.   PULMONARY REHAB OTHER RESPIRATORY from 06/22/2018 in [place]  Date  05/25/18  Educator  Wynetta Emery  Instruction Review Code  2- Demonstrated Understanding      Exercise: -Discuss the importance of exercise, FITT principles of exercise, normal and abnormal responses to exercise, and how to exercise safely.   Environmental Irritants: -Discuss types of environmental irritants and how to limit exposure to environmental irritants.   PULMONARY REHAB OTHER RESPIRATORY from 06/22/2018 in [place]  Date  06/01/18  Educator  Wynetta Emery  Instruction Review Code  2- Demonstrated Understanding      Meds/Inhalers and oxygen: - Discuss respiratory medications, definition of an inhaler and oxygen, and the proper way to use an inhaler and oxygen.   PULMONARY REHAB OTHER RESPIRATORY from 06/22/2018 in [place]  Date  06/08/18  Educator  Wynetta Emery      Energy Saving Techniques: - Discuss methods to conserve energy and decrease shortness of breath when performing activities of daily living.    Bronchial Hygiene / Breathing Techniques: - Discuss breathing mechanics, pursed-lip breathing technique,  proper posture, effective ways to clear airways, and other functional breathing techniques   PULMONARY REHAB OTHER RESPIRATORY from 06/22/2018 in [place]  Date  03/23/18  Educator  Wynetta Emery  Instruction Review Code  2- Demonstrated Understanding      Cleaning Equipment: - Provides group verbal and written instruction about the health risks of elevated  stress, cause of high stress, and healthy ways to reduce stress.   PULMONARY REHAB OTHER RESPIRATORY from 06/22/2018 in [place]  Date  03/30/18  Educator  Wynetta Emery  Instruction Review Code  2- Demonstrated Understanding      Nutrition I: Fats: - Discuss the types of cholesterol, what cholesterol does to the body, and how cholesterol levels can be controlled.   PULMONARY REHAB OTHER RESPIRATORY from 06/22/2018 in [place]  Date  04/06/18  Educator  Wynetta Emery  Instruction Review Code  2- Demonstrated Understanding      Nutrition II: Labels: -Discuss the different components of food labels and how to read food labels.   PULMONARY REHAB OTHER RESPIRATORY from 06/22/2018 in [place]  Date  04/20/18  Educator  DWynetta Emery  Instruction Review Code  2- Demonstrated Understanding      Respiratory Infections: -  Discuss the signs and symptoms of respiratory infections, ways to prevent respiratory infections, and the importance of seeking medical treatment when having a respiratory infection.   PULMONARY REHAB OTHER RESPIRATORY from 06/22/2018 in [place]  Date  04/27/18  Educator  Wynetta Emery  Instruction Review Code  2- Demonstrated Understanding      Stress I: Signs and Symptoms: - Discuss the causes of stress, how stress may lead to anxiety and depression, and ways to limit stress.   PULMONARY REHAB OTHER RESPIRATORY from 06/22/2018 in [place]  Date  05/04/18  Educator  MB  Instruction Review Code  2- Demonstrated Understanding      Stress II: Relaxation: -Discuss relaxation techniques to limit stress.   PULMONARY REHAB OTHER RESPIRATORY from 06/22/2018 in [place]  Date  05/11/18  Educator  Wynetta Emery  Instruction Review Code  2- Demonstrated Understanding      Oxygen for Home/Travel: - Discuss how to prepare for travel when on oxygen and proper ways to transport and store oxygen to ensure safety.   PULMONARY REHAB OTHER RESPIRATORY from 06/22/2018 in [employer]  Date  05/18/18  Educator  DJ  Instruction Review Code  2- Demonstrated Understanding      Knowledge Questionnaire Score:   Core Components/Risk Factors/Patient Goals at Admission:   Core Components/Risk Factors/Patient Goals Review:  Goals and Risk Factor Review    Row Name 05/25/18 1409 06/22/18 1440 07/10/18 1552 11/07/18 1354       Core Components/Risk Factors/Patient Goals Review   Personal Goals Review  Weight Management/Obesity;Improve shortness of breath with ADL's;Diabetes Increase stength and stamina.   Weight Management/Obesity;Improve shortness of breath with ADL's;Diabetes Increase strength and stamina.   Weight Management/Obesity;Improve shortness of breath with ADL's;Diabetes Increase strength and stamina.   Weight  Management/Obesity;Improve shortness of breath with ADL's;Diabetes Increase strength and stamina.    Review  Patient has completed 18 sessions gaining 2 lbs since last 30 day review. She is doing well in the program with progression. Her last reported A1C was 6.5 mg/dl on 04/21/2018. Her reported fasting glucose readings have improved since she started the program. She is not usually >120. She states she has more energy and is moving more now. She is volunteering for [facility] without diffculty. Will continue to monitor for progress.   Patient has completed 25 sessions losing 1 lb since last 30 day review. She continues to do well in the program with progression. Her last A1C was 04/21/18 at 6.5 mg/dl. Her reported fasting glucose readings continue to average 125. 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 Will continue to monitor for progress.   Patient has completed 26 sessions maintaining her weight since last 30 day review. She continues to do well in the program with progression. No recent A1C's on file. Her reported fasting glucose readings average 130 mg/dl. She says her strength and stamina have improved and she feels the program has helped her improve overall. She also reports breathing better. Outpatient pulmonary rehab services was suspended 07/03/18 due to COVID-19 restrictions. Will continue to monitor.   Pulmonary rehab closed 07/07/18 and reopened 10/16/18. Patient returned to the program maintaining her weight during the closure. She has completed 30 sessions. She is doing well in the program with progression. She says she feels stronger and has more energy and feels the program helps her mentally by getting out of the house. She is glad to be back and feels the program is benefiting her. Will continue to monitor for progress.    Expected Outcomes  Patient will continue to attend sessions and complete the program meeting her personal  goals.   Patient will continue to attend sessions and complete the program meeting her personal goals.   Patient will continue to attend sessions and complete the program meeting her personal goals.   Patient will continue to attend sessions and complete the program meeting her personal goals.        Core Components/Risk Factors/Patient Goals at Discharge (Final Review):  Goals and Risk Factor Review - 11/07/18 1354      Core Components/Risk Factors/Patient Goals Review   Personal Goals Review  Weight Management/Obesity;Improve shortness of breath with ADL's;Diabetes   Increase strength and stamina.   Review  Pulmonary rehab closed 07/07/18 and reopened 10/16/18. Patient returned to the program maintaining her weight during the closure. She has completed 30 sessions. She is doing well in the program with progression. She says she feels stronger and has more energy and feels the program helps her mentally by getting out of the house. She is glad to be back and feels the program is benefiting her. Will continue to monitor for progress.    Expected Outcomes  Patient will continue to attend sessions and complete the program meeting her personal goals.        ITP Comments: ITP Comments    Row Name 07/10/18 1550           ITP Comments  Pulmonary rehab services was suspended 07/03/18 due to COVID-19 restrictions. Services will resume when restrictions are lifted.           Comments: ITP REVIEW Pt is making expected progress toward pulmonary rehab goals after completing 30 sessions. Recommend continued exercise, life style modification, education, and utilization of breathing techniques to increase stamina and strength and decrease shortness of breath with exertion.

## 2018-11-09 ENCOUNTER — Other Ambulatory Visit: Payer: Self-pay | Admitting: Family Medicine

## 2018-11-09 ENCOUNTER — Encounter (HOSPITAL_COMMUNITY)
Admission: RE | Admit: 2018-11-09 | Discharge: 2018-11-09 | Disposition: A | Discharge status: Home / Self Care | Payer: Medicare Other | Source: Ambulatory Visit | Attending: Internal Medicine | Admitting: Internal Medicine

## 2018-11-09 ENCOUNTER — Other Ambulatory Visit: Payer: Self-pay

## 2018-11-09 DIAGNOSIS — I5032 Chronic diastolic (congestive) heart failure: Secondary | ICD-10-CM | POA: Diagnosis not present

## 2018-11-09 NOTE — Progress Notes (Signed)
Daily Session Note  Patient Details  Name: Julie Clay MRN: [id number] Date of Birth: 09-14-1940 Referring Provider:     PULMONARY REHAB OTHER RESP ORIENTATION from 03/15/2018 in [place]  Referring Provider  Caryl Comes      Encounter Date: 11/09/2018  Check In: Session Check In - 11/09/18 1045      Check-In   Supervising physician immediately available to respond to emergencies  See telemetry face sheet for immediately available ER MD    Location  [facility]    Staff Present  Benay Pike, Exercise Physiologist;Debra Wynetta Emery, RN, BSN    Virtual Visit  No    Medication changes reported      No    Fall or balance concerns reported     No    Tobacco Cessation  No Change    Warm-up and Cool-down  Performed as group-led instruction    Resistance Training Performed  No    VAD Patient?  No    PAD/SET Patient?  No      Pain Assessment   Currently in Pain?  No/denies    Pain Score  0-No pain    Multiple Pain Sites  No       Capillary Blood Glucose: No results found for this or any previous visit (from the past 24 hour(s)).    Social History   Tobacco Use  Smoking Status Never Smoker  Smokeless Tobacco Never Used    Goals Met:  Proper associated with RPD/PD & O2 Sat Independence with exercise equipment Using PLB without cueing & demonstrates good technique Exercise tolerated well No report of cardiac concerns or symptoms Strength training completed today  Goals Unmet:  Not Applicable  Comments: Pt able to follow exercise prescription today without complaint.  Will continue to monitor for progression. Check out 1145.   Dr. Sinda Du is Medical Director for [facility] Pulmonary Rehab.

## 2018-11-12 ENCOUNTER — Other Ambulatory Visit: Payer: Self-pay | Admitting: Internal Medicine

## 2018-11-14 ENCOUNTER — Encounter (HOSPITAL_COMMUNITY)
Admission: RE | Admit: 2018-11-14 | Discharge: 2018-11-14 | Disposition: A | Discharge status: Home / Self Care | Payer: Medicare Other | Source: Ambulatory Visit | Attending: Internal Medicine | Admitting: Internal Medicine

## 2018-11-14 ENCOUNTER — Other Ambulatory Visit: Payer: Self-pay

## 2018-11-14 DIAGNOSIS — I5032 Chronic diastolic (congestive) heart failure: Secondary | ICD-10-CM

## 2018-11-14 NOTE — Progress Notes (Signed)
Daily Session Note  Patient Details  Name: Julie Clay MRN: [id number] Date of Birth: December 16, 1940 Referring Provider:     PULMONARY REHAB OTHER RESP ORIENTATION from 03/15/2018 in [place]  Referring Provider  Caryl Comes      Encounter Date: 11/14/2018  Check In: Session Check In - 11/14/18 1045      Check-In   Supervising physician immediately available to respond to emergencies  See telemetry face sheet for immediately available MD    Location  [facility]    Staff Present  Russella Dar, MS, EP, [facility], Exercise Physiologist;Ritisha Deitrick Zachery Conch, Exercise Physiologist    Virtual Visit  No    Medication changes reported      No    Fall or balance concerns reported     No    Tobacco Cessation  No Change    Warm-up and Cool-down  Performed as group-led instruction    Resistance Training Performed  Yes    VAD Patient?  No    PAD/SET Patient?  No      Pain Assessment   Currently in Pain?  No/denies    Pain Score  0-No pain    Multiple Pain Sites  No       Capillary Blood Glucose: No results found for this or any previous visit (from the past 24 hour(s)).    Social History   Tobacco Use  Smoking Status Never Smoker  Smokeless Tobacco Never Used    Goals Met:  Proper associated with RPD/PD & O2 Sat Independence with exercise equipment Using PLB without cueing & demonstrates good technique Exercise tolerated well No report of cardiac concerns or symptoms Strength training completed today  Goals Unmet:  Not Applicable  Comments: Pt able to follow exercise prescription today without complaint.  Will continue to monitor for progression. Check out 1145.   Dr. Kate Sable is Medical Director for [facility] Cardiac and Pulmonary Rehab.

## 2018-11-16 ENCOUNTER — Encounter (HOSPITAL_COMMUNITY)
Admission: RE | Admit: 2018-11-16 | Discharge: 2018-11-16 | Disposition: A | Discharge status: Home / Self Care | Payer: Medicare Other | Source: Ambulatory Visit | Attending: Internal Medicine | Admitting: Internal Medicine

## 2018-11-16 ENCOUNTER — Other Ambulatory Visit: Payer: Self-pay

## 2018-11-16 DIAGNOSIS — I5032 Chronic diastolic (congestive) heart failure: Secondary | ICD-10-CM | POA: Diagnosis not present

## 2018-11-16 NOTE — Progress Notes (Signed)
Daily Session Note  Patient Details  Name: Julie Clay MRN: [id number] Date of Birth: Mar 06, 1941 Referring Provider:     PULMONARY REHAB OTHER RESP ORIENTATION from 03/15/2018 in [place]  Referring Provider  Caryl Comes      Encounter Date: 11/16/2018  Check In: Session Check In - 11/16/18 1045      Check-In   Supervising physician immediately available to respond to emergencies  See telemetry face sheet for immediately available MD    Location  [facility]    Staff Present  Russella Dar, MS, EP, [facility], Exercise Physiologist;Naiah Donahoe Zachery Conch, Exercise Physiologist    Virtual Visit  No    Medication changes reported      No    Fall or balance concerns reported     No    Tobacco Cessation  No Change    Warm-up and Cool-down  Performed as group-led instruction    Resistance Training Performed  Yes    VAD Patient?  No    PAD/SET Patient?  No      Pain Assessment   Currently in Pain?  No/denies    Pain Score  0-No pain    Multiple Pain Sites  No       Capillary Blood Glucose: No results found for this or any previous visit (from the past 24 hour(s)).    Social History   Tobacco Use  Smoking Status Never Smoker  Smokeless Tobacco Never Used    Goals Met:  Proper associated with RPD/PD & O2 Sat Independence with exercise equipment Using PLB without cueing & demonstrates good technique Exercise tolerated well No report of cardiac concerns or symptoms Strength training completed today  Goals Unmet:  Not Applicable  Comments: Pt able to follow exercise prescription today without complaint.  Will continue to monitor for progression. Check out 1145.   Dr. Sinda Du is Medical Director for [facility] Pulmonary Rehab.

## 2018-11-21 ENCOUNTER — Encounter (HOSPITAL_COMMUNITY)
Admission: RE | Admit: 2018-11-21 | Discharge: 2018-11-21 | Disposition: A | Discharge status: Home / Self Care | Payer: Medicare Other | Source: Ambulatory Visit | Attending: Internal Medicine | Admitting: Internal Medicine

## 2018-11-21 ENCOUNTER — Other Ambulatory Visit: Payer: Self-pay

## 2018-11-21 DIAGNOSIS — I5032 Chronic diastolic (congestive) heart failure: Secondary | ICD-10-CM | POA: Diagnosis not present

## 2018-11-21 NOTE — Progress Notes (Signed)
Daily Session Note  Patient Details  Name: DELOYCE WALTHERS MRN: [id number] Date of Birth: 24-Jun-1940 Referring Provider:     PULMONARY REHAB OTHER RESP ORIENTATION from 03/15/2018 in [place]  Referring Provider  Caryl Comes      Encounter Date: 11/21/2018  Check In: Session Check In - 11/21/18 1045      Check-In   Supervising physician immediately available to respond to emergencies  See telemetry face sheet for immediately available MD    Location  [facility]    Staff Present  Russella Dar, MS, EP, [facility], Exercise Physiologist;Mariel Gaudin, Exercise Physiologist;Debra Wynetta Emery, RN, BSN    Virtual Visit  No    Medication changes reported      No    Fall or balance concerns reported     No    Tobacco Cessation  No Change    Warm-up and Cool-down  Performed as group-led instruction    Resistance Training Performed  Yes    VAD Patient?  No    PAD/SET Patient?  No      Pain Assessment   Currently in Pain?  No/denies    Pain Score  0-No pain    Multiple Pain Sites  No       Capillary Blood Glucose: No results found for this or any previous visit (from the past 24 hour(s)).    Social History   Tobacco Use  Smoking Status Never Smoker  Smokeless Tobacco Never Used    Goals Met:  Proper associated with RPD/PD & O2 Sat Independence with exercise equipment Using PLB without cueing & demonstrates good technique Exercise tolerated well No report of cardiac concerns or symptoms Strength training completed today  Goals Unmet:  Not Applicable  Comments: Pt able to follow exercise prescription today without complaint.  Will continue to monitor for progression. Check out 1145.   Dr. Sinda Du is Medical Director for [facility] Pulmonary Rehab.

## 2018-11-22 ENCOUNTER — Ambulatory Visit (INDEPENDENT_AMBULATORY_CARE_PROVIDER_SITE_OTHER): Payer: Medicare Other | Admitting: *Deleted

## 2018-11-22 DIAGNOSIS — Z23 Encounter for immunization: Secondary | ICD-10-CM | POA: Diagnosis not present

## 2018-11-23 ENCOUNTER — Encounter (HOSPITAL_COMMUNITY)
Admission: RE | Admit: 2018-11-23 | Discharge: 2018-11-23 | Disposition: A | Discharge status: Home / Self Care | Payer: Medicare Other | Source: Ambulatory Visit | Attending: Internal Medicine | Admitting: Internal Medicine

## 2018-11-23 ENCOUNTER — Other Ambulatory Visit: Payer: Self-pay

## 2018-11-23 VITALS — Ht 65.0 in | Wt 224.4 lb

## 2018-11-23 DIAGNOSIS — I5032 Chronic diastolic (congestive) heart failure: Secondary | ICD-10-CM | POA: Diagnosis not present

## 2018-11-23 NOTE — Progress Notes (Signed)
Daily Session Note  Patient Details  Name: EMALIA WITKOP MRN: [id number] Date of Birth: [birth year] Referring Provider:     PULMONARY REHAB OTHER RESP ORIENTATION from 03/15/2018 in [place]  Referring Provider  Caryl Comes      Encounter Date: 11/23/2018  Check In: Session Check In - 11/23/18 1045      Check-In   Supervising physician immediately available to respond to emergencies  See telemetry face sheet for immediately available MD    Location  [facility]    Staff Present  Russella Dar, MS, EP, [facility], Exercise Physiologist;Samanthamarie Ezzell, Exercise Physiologist;Debra Wynetta Emery, RN, BSN    Virtual Visit  No    Medication changes reported      No    Fall or balance concerns reported     No    Tobacco Cessation  No Change    Warm-up and Cool-down  Performed as group-led instruction    Resistance Training Performed  Yes    VAD Patient?  No      Pain Assessment   Currently in Pain?  Yes    Pain Score  2     Pain Location  Hip    Pain Orientation  Right    Pain Descriptors / Indicators  Aching    Pain Type  Chronic pain    Pain Radiating Towards  NA    Pain Onset  More than a month ago    Pain Frequency  Intermittent    Aggravating Factors   Weight bearing    Pain Relieving Factors  Tylenol and topical ointment    Effect of Pain on Daily Activities  Limits activities    Multiple Pain Sites  Yes      2nd Pain Site   Pain Score  2    Pain Location  Knee       Capillary Blood Glucose: No results found for this or any previous visit (from the past 24 hour(s)).    Social History   Tobacco Use  Smoking Status Never Smoker  Smokeless Tobacco Never Used    Goals Met:  Proper associated with RPD/PD & O2 Sat Independence with exercise equipment Improved SOB with ADL's Using PLB without cueing & demonstrates good technique Exercise tolerated well No report of cardiac concerns or symptoms Strength training completed today  Goals  Unmet:  Not Applicable  Comments: Pt able to follow exercise prescription today without complaint.  Will continue to monitor for progression. Check out 1145.   Dr. Sinda Du is Medical Director for [facility] Pulmonary Rehab.

## 2018-11-28 ENCOUNTER — Encounter (HOSPITAL_COMMUNITY)
Admission: RE | Admit: 2018-11-28 | Discharge: 2018-11-28 | Disposition: A | Discharge status: Home / Self Care | Payer: Medicare Other | Source: Ambulatory Visit | Attending: Internal Medicine | Admitting: Internal Medicine

## 2018-11-28 ENCOUNTER — Other Ambulatory Visit: Payer: Self-pay

## 2018-11-28 DIAGNOSIS — I5032 Chronic diastolic (congestive) heart failure: Secondary | ICD-10-CM

## 2018-11-28 NOTE — Progress Notes (Signed)
Daily Session Note  Patient Details  Name: Julie Clay MRN: [id number] Date of Birth: Nov 29, 1940 Referring Provider:     PULMONARY REHAB OTHER RESP ORIENTATION from 03/15/2018 in [place]  Referring Provider  Caryl Comes      Encounter Date: 11/28/2018  Check In:   Capillary Blood Glucose: No results found for this or any previous visit (from the past 24 hour(s)).    Social History   Tobacco Use  Smoking Status Never Smoker  Smokeless Tobacco Never Used    Goals Met:  Independence with exercise equipment Exercise tolerated well Personal goals reviewed No report of cardiac concerns or symptoms Strength training completed today  Goals Unmet:  Not Applicable  Comments: Check out: 11:30   Dr. Kate Sable is Medical Director for [facility] and Pulmonary Rehab.

## 2018-11-30 NOTE — Progress Notes (Signed)
Discharge Progress Report  Patient Details  Name: Julie Clay MRN: [id number] Date of Birth: 12-24-1940 Referring Provider:     PULMONARY REHAB OTHER RESP ORIENTATION from 03/15/2018 in [place]  Referring Provider  Caryl Comes       Number of Visits: 36  Reason for Discharge:  Patient reached a stable level of exercise. Patient independent in their exercise. Patient has met program and personal goals.  Smoking History:  Social History   Tobacco Use  Smoking Status Never Smoker  Smokeless Tobacco Never Used    Diagnosis:  Chronic diastolic CHF (congestive heart failure) (HCC)  ADL [facility]: Pulmonary Assessment Scores    Row Name 11/23/18 1428         ADL [facility]   ADL Phase  Exit     SOB Score total  74     Rest  0     Walk  10     Stairs  5     Bath  1     Dress  3     Shop  3       CAT Score   CAT Score  16       mMRC Score   mMRC Score  2        Initial Exercise Prescription:   Discharge Exercise Prescription (Final Exercise Prescription Changes): Exercise Prescription Changes - 11/23/18 1100      Response to Exercise   Blood Pressure (Admit)  124/62    Blood Pressure (Exercise)  150/60    Blood Pressure (Exit)  128/64    Heart Rate (Admit)  98 bpm    Heart Rate (Exercise)  107 bpm    Heart Rate (Exit)  80 bpm    Oxygen Saturation (Admit)  96 %    Oxygen Saturation (Exercise)  92 %    Oxygen Saturation (Exit)  98 %    Rating of Perceived Exertion (Exercise)  14    Perceived Dyspnea (Exercise)  15    Comments  Post 6MW test    Duration  Continue with 30 min of aerobic exercise without signs/symptoms of physical distress.    Intensity  THRR unchanged       Functional Capacity: 6 Minute Walk    Row Name 11/23/18 1124         6 Minute Walk   Phase  Discharge     Distance  800 feet     Distance % Change  11 %     Distance Feet Change  100 ft     Walk Time  6 minutes     # of Rest Breaks  0     MPH  1.51     METS   2.16     RPE  15     Perceived Dyspnea   15     VO2 Peak  4.9     Symptoms  Yes (comment)     Comments  SOB, 2/10 bilateral knee pain, 2/10 R hip pain     Resting HR  98 bpm     Resting BP  124/62     Resting Oxygen Saturation   96 %     Exercise Oxygen Saturation  during 6 min walk  92 %     Max Ex. HR  107 bpm     Max Ex. BP  150/60     2 Minute Post BP  128/64        Psychological, QOL, Others -  Outcomes: PHQ 2/9: Depression screen [facility] 2/9 11/23/2018 04/25/2018 03/15/2018 11/11/2017 07/07/2017  Decreased Interest 0 0 0 0 0  Down, Depressed, Hopeless 1 0 0 0 0  PHQ - 2 Score 1 0 0 0 0  Altered sleeping 2 - 0 - -  Tired, decreased energy 2 - 3 - -  Change in appetite - - - - -  Feeling bad or failure about yourself  0 - 0 - -  Trouble concentrating 0 - 0 - -  Moving slowly or fidgety/restless 0 - 0 - -  Suicidal thoughts 0 - 0 - -  PHQ-9 Score 5 - 3 - -  Difficult doing work/chores Not difficult at all - Somewhat difficult - -  Some recent data might be hidden    Quality of Life: Quality of Life - 11/28/18 1258      Quality of Life   Select  Quality of Life      Quality of Life Scores   Health/Function Pre  22.37 %    Health/Function Post  19.36 %    Health/Function % Change  -13.46 %    Socioeconomic Pre  23.33 %    Socioeconomic Post  19.75 %    Socioeconomic % Change   -15.35 %    Psych/Spiritual Pre  22.79 %    Psych/Spiritual Post  21.79 %    Psych/Spiritual % Change  -4.39 %    Family Pre  22.9 %    Family Post  20.3 %    Family % Change  -11.35 %    GLOBAL Pre  22.71 %    GLOBAL Post  20.11 %    GLOBAL % Change  -11.45 %       Personal Goals: Goals established at orientation with interventions provided to work toward goal.    Personal Goals Discharge: Goals and Risk Factor Review    Row Name 06/22/18 1440 07/10/18 1552 11/07/18 1354 11/30/18 1303       Core Components/Risk Factors/Patient Goals Review   Personal Goals Review  Weight  Management/Obesity;Improve shortness of breath with ADL's;Diabetes Increase strength and stamina.   Weight Management/Obesity;Improve shortness of breath with ADL's;Diabetes Increase strength and stamina.   Weight Management/Obesity;Improve shortness of breath with ADL's;Diabetes Increase strength and stamina.  Weight Management/Obesity;Improve shortness of breath with ADL's;Diabetes Get more stamina; continue to gain strength and stamina.    Review  Patient has completed 25 sessions losing 1 lb since last 30 day review. She continues to do well in the program with progression. Her last A1C was 04/21/18 at 6.5 mg/dl. Her reported fasting glucose readings continue to average 125. 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 Will continue to monitor for progress.   Patient has completed 26 sessions maintaining her weight since last 30 day review. She continues to do well in the program with progression. No recent A1C's on file. Her reported fasting glucose readings average 130 mg/dl. She says her strength and stamina have improved and she feels the program has helped her improve overall. She also reports breathing better. Outpatient pulmonary rehab services was suspended 07/03/18 due to COVID-19 restrictions. Will continue to monitor.   Pulmonary rehab closed 07/07/18 and reopened 10/16/18. Patient returned to the program maintaining her weight during the closure. She has completed 30 sessions. She is doing well in the program with progression. She says she feels stronger and has more energy and feels the program  helps her mentally by getting out of the house. She is glad to be back and feels the program is benefiting her. Will continue to monitor for progress.  Patient graduated with 36 sessions losing 1 lb overall. She did well in the program. Her reported glucose readings improved through-out the program. Her last A1C was 09/12/18 at 6.4 mg/dl. She says  she does have more energy and feels her stamina has improved. She feels the program really helped her accomplish her goals. She plans to  join our [employer] program to continue exercise for at least one month and maybe longer. PR will f/u for one year.    Expected Outcomes  Patient will continue to attend sessions and complete the program meeting her personal goals.   Patient will continue to attend sessions and complete the program meeting her personal goals.   Patient will continue to attend sessions and complete the program meeting her personal goals.   Patient will continue to exercise and continue to meet her personal goals.       Exercise Goals and Review:   Exercise Goals Re-Evaluation: Exercise Goals Re-Evaluation    Row Name 06/21/18 1434 07/10/18 1117 11/07/18 1300         Exercise Goal Re-Evaluation   Exercise Goals Review  Increase Physical Activity;Increase Strength and Stamina;Able to understand and use Dyspnea scale;Able to understand and use rate of perceived exertion (RPE) scale;Knowledge and understanding of Target Heart Rate Range (THRR);Able to check pulse independently;Improve claudication pain tolerance and improve walking ability;Understanding of Exercise Prescription  Increase Physical Activity;Increase Strength and Stamina;Able to understand and use Dyspnea scale;Able to understand and use rate of perceived exertion (RPE) scale;Knowledge and understanding of Target Heart Rate Range (THRR);Able to check pulse independently;Improve claudication pain tolerance and improve walking ability;Understanding of Exercise Prescription  Increase Physical Activity;Increase Strength and Stamina;Able to understand and use rate of perceived exertion (RPE) scale;Able to understand and use Dyspnea scale;Knowledge and understanding of Target Heart Rate Range (THRR);Able to check pulse independently;Understanding of Exercise Prescription     Comments  Pt. continues to do well in the  program. Since our talk about pushing herself she has tried her best to get at least a mile on the NuStep every session. She states this has helped her to be alble to do more at home before needing to stop and take a break.   Pt. continues to do well in the program. She still pushes herself to increase her distances on both machines every session.   Pt. has done well in Pulmonary Rehab. She says this helps her to remain active and social, which she enjoys getting out of the house.     Expected Outcomes  increase strength and stamina.   increase strength and stamina.   Short: increase overall activity level. Long: increase strength and stamina        Nutrition & Weight - Outcomes:  Post Biometrics - 11/23/18 1127       Post  Biometrics   Height  5' 5"  (1.651 m)    Weight  101.8 kg    Waist Circumference  46 inches    Hip Circumference  57 inches    Waist to Hip Ratio  0.81 %    BMI (Calculated)  37.35    Triceps Skinfold  27 mm    % Body Fat  48.2 %    Grip Strength  15.8 kg    Single Leg Stand  9.2 seconds  Nutrition: Nutrition Therapy & Goals - 11/30/18 1307      Personal Nutrition Goals   Comments  Patient is discharged from the program. She lost 1 lbs overall in the program. Her DM is controlled. She feels she has met her nutritional goal.       Nutrition Discharge: Nutrition Assessments - 11/23/18 1432      MEDFICTS Scores   Pre Score  53    Post Score  39    Score Difference  -14       Education Questionnaire Score: Knowledge Questionnaire Score - 11/23/18 1432      Knowledge Questionnaire Score   Pre Score  23/24    Post Score  15/18       Goals reviewed with patient; copy given to patient.

## 2018-11-30 NOTE — Progress Notes (Signed)
Pulmonary Individual Treatment Plan  Patient Details  Name: Julie Clay MRN: [id number] Date of Birth: Jan 31, 1941 Referring Provider:     PULMONARY REHAB OTHER RESP ORIENTATION from 03/15/2018 in [facility]  Referring Provider  Caryl Comes      Initial Encounter Date:    PULMONARY REHAB OTHER RESP ORIENTATION from 03/15/2018 in [facility]  Date  03/15/18      Visit Diagnosis: Chronic diastolic CHF (congestive heart failure) ([place])   Patient's Home Medications on Admission:   Current Outpatient Medications:  .  acetaminophen (TYLENOL) 500 MG tablet, Take 500 mg by mouth as needed for moderate pain. Takes 2 when needed, Disp: , Rfl:  .  albuterol (PROVENTIL HFA;VENTOLIN HFA) 108 (90 Base) MCG/ACT inhaler, Inhale 2 puffs into the lungs every 4 (four) hours as needed., Disp: 6.7 g, Rfl: 6 .  Aloe Vera Leaf POWD, Take 800 mg by mouth 2 (two) times daily. , Disp: , Rfl:  .  Ascorbic Acid (VITAMIN C) 1000 MG tablet, Take 1,000 mg by mouth 2 (two) times daily., Disp: , Rfl:  .  b complex vitamins tablet, Take 1 tablet by mouth 2 (two) times daily. , Disp: , Rfl:  .  Blood Glucose Monitoring Suppl (BLOOD GLUCOSE SYSTEM PAK) KIT, Use as directed to monitor FSBS 1x daily. Dx: E11.9. Please dispense as Accu-Chek Aviva, Disp: 1 each, Rfl: 1 .  Cholecalciferol (VITAMIN D3) 2000 UNITS TABS, Take 2,000 Units by mouth 2 (two) times daily., Disp: , Rfl:  .  Docusate Calcium (STOOL SOFTENER PO), Take 1 capsule by mouth as needed (constipation). , Disp: , Rfl:  .  Echinacea 380 MG CAPS, Take 1 capsule by mouth as needed (cold/flue)., Disp: , Rfl:  .  fluocinonide cream (LIDEX) 4.78 %, Apply 1 application topically 2 (two) times daily as needed (poison ivy/bites/hives)., Disp: 30 g, Rfl: 11 .  fluticasone (FLONASE) 50 MCG/ACT nasal spray, Place 2 sprays into both nostrils daily., Disp: 16 g, Rfl: 6 .  Ginkgo Biloba 120 MG CAPS, Take 1 capsule by mouth daily. ,  Disp: , Rfl:  .  glipiZIDE (GLUCOTROL) 10 MG tablet, Take 0.5 tablets (5 mg total) by mouth 2 (two) times daily before a meal. For diabetes, Disp: 180 tablet, Rfl: 0 .  Glucose Blood (BLOOD GLUCOSE TEST STRIPS) STRP, Use as directed to monitor FSBS 1x daily. Dx: E11.9. Please dispense as Accu-Chek Aviva, Disp: 100 each, Rfl: 1 .  KRILL OIL PO, Take 1 capsule by mouth 2 (two) times daily., Disp: , Rfl:  .  Lancets MISC, Use as directed to monitor FSBS 1x daily. Dx: E11.9. Please dispense as Accu-Chek Aviva, Disp: 100 each, Rfl: 1 .  levocetirizine (XYZAL) 5 MG tablet, Take 1 tablet (5 mg total) by mouth every evening., Disp: 30 tablet, Rfl: 0 .  LORazepam (ATIVAN) 0.5 MG tablet, Take 1 tablet (0.5 mg total) by mouth 2 (two) times daily as needed for anxiety., Disp: 30 tablet, Rfl: 1 .  Melatonin 3 MG TABS, Take 3 mg by mouth at bedtime as needed and may repeat dose one time if needed (sleep). , Disp: , Rfl:  .  metFORMIN (GLUCOPHAGE) 1000 MG tablet, TAKE 1 TABLET BY MOUTH TWICE DAILY WITH A MEAL for diabetes, Disp: 180 tablet, Rfl: 1 .  metoprolol succinate (TOPROL-XL) 25 MG 24 hr tablet, Take 0.5 tablets (12.5 mg total) by mouth daily., Disp: 45 tablet, Rfl: 1 .  mometasone-formoterol (DULERA) 100-5 MCG/ACT AERO, Inhale 2 puffs  into the lungs 2 (two) times daily. For asthma, Disp: 1 Inhaler, Rfl: 6 .  Multiple Vitamin (MULTIVITAMIN WITH MINERALS) TABS tablet, Take 1 tablet by mouth daily., Disp: , Rfl:  .  NONFORMULARY OR COMPOUNDED ITEM, Apply 1 application topically 2 (two) times daily as needed. Reported on 06/10/2015, Disp: , Rfl:  .  rosuvastatin (CRESTOR) 5 MG tablet, TAKE 1 TABLET(5 MG) BY MOUTH DAILY for Cholesterol, Disp: 90 tablet, Rfl: 0 .  simethicone (MYLICON) 80 MG chewable tablet, Chew 80 mg by mouth as needed for flatulence. , Disp: , Rfl:  .  tiZANidine (ZANAFLEX) 4 MG tablet, Take 0.5 tablets (2 mg total) by mouth every 6 (six) hours as needed for muscle spasms., Disp: 30 tablet, Rfl:  1 .  torsemide (DEMADEX) 20 MG tablet, Take 1 tablet by mouth once daily, Disp: 90 tablet, Rfl: 0 .  triamcinolone cream (KENALOG) 0.1 %, Apply 1 application topically 2 (two) times daily. (Patient taking differently: Apply 1 application topically as needed. ), Disp: 30 g, Rfl: 0 .  TRULICITY 6.28 ZM/6.2HU SOPN, INJECT 1 PEN INTO THE SKIN ONCE EVERY WEEK FOR DIABETES, Disp: 4 mL, Rfl: 0  Past Medical History: Past Medical History:  Diagnosis Date  . Asthma    spirometry (2011)- mild ventilary defect  . Bone spur    "heels, knees, left shoulder" (06/22/2017)  . Breast cancer, left breast ([place]) dx'd 03/2011  . Chronic bronchitis ([place])   . COPD (chronic obstructive pulmonary disease) ([place])   . Fatty liver 2013   enlarged  . GERD (gastroesophageal reflux disease)   . HSV (herpes simplex virus) infection   . Hyperlipidemia   . Hypertension   . Lung nodule seen on imaging study 2013  . Lymphedema    Left arm  . 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 Osteoarthritis    "all over" (06/22/2017)  . Sleep apnea    Stop Bang score of 5. Pt has had sleep study, but was shown to be negative for sleep apnea.  . Type II diabetes mellitus ([place])   . Ventral hernia, unspecified, without mention of obstruction or gangrene     Tobacco Use: Social History   Tobacco Use  Smoking Status Never Smoker  Smokeless Tobacco Never Used    Labs: Recent Review Flowsheet Data    Labs for ITP Cardiac and Pulmonary Rehab Latest Ref Rng & Units 03/14/2017 07/08/2017 11/09/2017 04/21/2018 09/12/2018   Cholestrol <200 mg/dL 141 139 128 146 137   LDLCALC mg/dL (calc) 68 65 56 73 60   LDLDIRECT mg/dL - - - - -   HDL > OR = 50 mg/dL 47(L) 50(L) 45(L) 48(L) 48(L)   Trlycerides <150 mg/dL 181(H) 159(H) 197(H) 169(H) 218(H)   Hemoglobin A1c <5.7 % of total Hgb 6.8(H) 6.5(H) 6.9(H) 6.5(H) 6.4(H)      Capillary Blood Glucose: Lab Results  Component Value Date   GLUCAP 83 06/23/2017   GLUCAP 117 (H) 06/22/2017    GLUCAP 81 06/22/2017   GLUCAP 97 06/22/2017   GLUCAP 185 (H) 11/06/2013     Pulmonary Assessment Scores: Pulmonary Assessment Scores    Row Name 11/23/18 1428         ADL UCSD   ADL Phase  Exit     SOB Score total  74     Rest  0     Walk  10     Stairs  5     Bath  1     Dress  3     Shop  3       CAT Score   CAT Score  16       mMRC Score   mMRC Score  2       UCSD: Self-administered rating of dyspnea associated with activities of daily living (ADLs) 6-point scale (0 = "not at all" to 5 = "maximal or unable to do because of breathlessness")  Scoring Scores range from 0 to 120.  Minimally important difference is 5 units  CAT: CAT can identify the health impairment of COPD patients and is better correlated with disease progression.  CAT has a scoring range of zero to 40. The CAT score is classified into four groups of low (less than 10), medium (10 - 20), high (21-30) and very high (31-40) based on the impact level of disease on health status. A CAT score over 10 suggests significant symptoms.  A worsening CAT score could be explained by an exacerbation, poor medication adherence, poor inhaler technique, or progression of COPD or comorbid conditions.  CAT MCID is 2 points  mMRC: mMRC (Modified Medical Research Council) Dyspnea Scale is used to assess the degree of baseline functional disability in patients of respiratory disease due to dyspnea. No minimal important difference is established. A decrease in score of 1 point or greater is considered a positive change.   Pulmonary Function Assessment:   Exercise Target Goals: Exercise Program Goal: Individual exercise prescription set using results from initial 6 min walk test and THRR while considering  patient's activity barriers and safety.   Exercise Prescription Goal: Initial exercise prescription builds to 30-45 minutes a day of aerobic activity, 2-3 days per week.  Home exercise guidelines will be given to  patient during program as part of exercise prescription that the participant will acknowledge.  Activity Barriers & Risk Stratification:   6 Minute Walk: 6 Minute Walk    Row Name 11/23/18 1124         6 Minute Walk   Phase  Discharge     Distance  800 feet     Distance % Change  11 %     Distance Feet Change  100 ft     Walk Time  6 minutes     # of Rest Breaks  0     MPH  1.51     METS  2.16     RPE  15     Perceived Dyspnea   15     VO2 Peak  4.9     Symptoms  Yes (comment)     Comments  SOB, 2/10 bilateral knee pain, 2/10 R hip pain     Resting HR  98 bpm     Resting BP  124/62     Resting Oxygen Saturation   96 %     Exercise Oxygen Saturation  during 6 min walk  92 %     Max Ex. HR  107 bpm     Max Ex. BP  150/60     2 Minute Post BP  128/64        Oxygen Initial Assessment:   Oxygen Re-Evaluation: Oxygen Re-Evaluation    Row Name 06/22/18 1439 07/10/18 1551 11/07/18 1352         Program Oxygen Prescription   Program Oxygen Prescription  None  None  None       Home Oxygen   Home Oxygen Device  None  None  None     Sleep Oxygen Prescription  None  None  None     Home Exercise Oxygen Prescription  None  None  None     Home at Rest Exercise Oxygen Prescription  None  None  None     Compliance with Home Oxygen Use  -  - N/A  -       Goals/Expected Outcomes   Short Term Goals  To learn and understand importance of monitoring SPO2 with pulse oximeter and demonstrate accurate use of the pulse oximeter.;To learn and understand importance of maintaining oxygen saturations>88%;To learn and demonstrate proper pursed lip breathing techniques or other breathing techniques.  To learn and understand importance of monitoring SPO2 with pulse oximeter and demonstrate accurate use of the pulse oximeter.;To learn and understand importance of maintaining oxygen saturations>88%;To learn and demonstrate proper pursed lip breathing techniques or other breathing techniques.  To  learn and understand importance of monitoring SPO2 with pulse oximeter and demonstrate accurate use of the pulse oximeter.;To learn and understand importance of maintaining oxygen saturations>88%;To learn and demonstrate proper pursed lip breathing techniques or other breathing techniques.     Long  Term Goals  Verbalizes importance of monitoring SPO2 with pulse oximeter and return demonstration;Maintenance of O2 saturations>88%;Exhibits proper breathing techniques, such as pursed lip breathing or other method taught during program session  Verbalizes importance of monitoring SPO2 with pulse oximeter and return demonstration;Maintenance of O2 saturations>88%;Exhibits proper breathing techniques, such as pursed lip breathing or other method taught during program session  Verbalizes importance of monitoring SPO2 with pulse oximeter and return demonstration;Maintenance of O2 saturations>88%;Exhibits proper breathing techniques, such as pursed lip breathing or other method taught during program session     Comments  Patient is able to use pulse oximeter properly with return demonstration and she is able to verbalize the importance of maintaining her O2Sat >88%.   Patient is able to use pulse oximeter properly with return demonstration and she is able to verbalize the importance of maintaining her O2Sat >88%.   Patient is able to use pulse oximeter properly with return demonstration and she is able to verbalize the importance of maintaining her O2Sat >88%.      Goals/Expected Outcomes  Patient will continue to meet her short and long term goals.   Patient will continue to meet her short and long term goals.   Patient will continue to meet her short and long term goals.         Oxygen Discharge (Final Oxygen Re-Evaluation): Oxygen Re-Evaluation - 11/07/18 1352      Program Oxygen Prescription   Program Oxygen Prescription  None      Home Oxygen   Home Oxygen Device  None    Sleep Oxygen Prescription  None     Home Exercise Oxygen Prescription  None    Home at Rest Exercise Oxygen Prescription  None      Goals/Expected Outcomes   Short Term Goals  To learn and understand importance of monitoring SPO2 with pulse oximeter and demonstrate accurate use of the pulse oximeter.;To learn and understand importance of maintaining oxygen saturations>88%;To learn and demonstrate proper pursed lip breathing techniques or other breathing techniques.    Long  Term Goals  Verbalizes importance of monitoring SPO2 with pulse oximeter and return demonstration;Maintenance of O2 saturations>88%;Exhibits proper breathing techniques, such as pursed lip breathing or other method taught during program session    Comments  Patient is able to use pulse oximeter properly with  return demonstration and she is able to verbalize the importance of maintaining her O2Sat >88%.     Goals/Expected Outcomes  Patient will continue to meet her short and long term goals.        Initial Exercise Prescription:   Perform Capillary Blood Glucose checks as needed.  Exercise Prescription Changes:  Exercise Prescription Changes    Row Name 06/14/18 1500 06/27/18 1200 07/10/18 1100 11/02/18 1300 11/23/18 1100     Response to Exercise   Blood Pressure (Admit)  120/64  112/64  -  116/68  124/62   Blood Pressure (Exercise)  136/70  160/78  -  130/72  150/60   Blood Pressure (Exit)  116/68  118/66  -  120/72  128/64   Heart Rate (Admit)  78 bpm  95 bpm  -  89 bpm  98 bpm   Heart Rate (Exercise)  99 bpm  103 bpm  -  101 bpm  107 bpm   Heart Rate (Exit)  78 bpm  97 bpm  -  81 bpm  80 bpm   Oxygen Saturation (Admit)  98 %  95 %  -  95 %  96 %   Oxygen Saturation (Exercise)  95 %  97 %  -  96 %  92 %   Oxygen Saturation (Exit)  96 %  97 %  -  97 %  98 %   Rating of Perceived Exertion (Exercise)  13  12  -  13  14   Perceived Dyspnea (Exercise)  14  12  -  11  15   Comments  -  -  - has not attended since 06/27/2018 - which was last reported.    returned after COVID  Post 6MW test   Duration  Continue with 30 min of aerobic exercise without signs/symptoms of physical distress.  Continue with 30 min of aerobic exercise without signs/symptoms of physical distress.  -  Continue with 30 min of aerobic exercise without signs/symptoms of physical distress.  Continue with 30 min of aerobic exercise without signs/symptoms of physical distress.   Intensity  THRR unchanged  THRR unchanged  -  THRR unchanged  THRR unchanged     Progression   Progression  Continue to progress workloads to maintain intensity without signs/symptoms of physical distress.  Continue to progress workloads to maintain intensity without signs/symptoms of physical distress.  -  Continue to progress workloads to maintain intensity without signs/symptoms of physical distress.  -   Average METs  2.2  2.3  -  1.8  -     Resistance Training   Training Prescription  Yes  Yes  -  Yes  -   Weight  3  3  -  3  -   Reps  10-15  10-15  -  10-15  -     NuStep   Level  3  3  -  2  -   SPM  88  84  -  76  -   Minutes  17  17  -  17  -   METs  2  2  -  1.9  -     Arm Ergometer   Level  3  3.5  -  1  -   Watts  30  19  -  10  -   RPM  50  45  -  34  -   Minutes  22  22  -  22  -  METs  2.4  2.5  -  1.6  -     Home Exercise Plan   Plans to continue exercise at  Home (comment)  Home (comment)  -  Home (comment)  -   Frequency  Add 3 additional days to program exercise sessions.  Add 3 additional days to program exercise sessions.  -  Add 3 additional days to program exercise sessions.  -   Initial Home Exercises Provided  03/15/18  03/15/18  -  03/15/18  -      Exercise Comments:  Exercise Comments    Row Name 06/21/18 1436 07/10/18 1118 11/02/18 1313 11/07/18 1302     Exercise Comments  Pt. is still working hard on her goal of 1 miles on the NuStep and is very close to reaching it. She is also up to level 3 on the Arm Ergometer.   Pt. is doing great in PR. She is now on  level 3 on the Nustep and level 3.5 on the Arm Ergometer. She tolerates those well while still pushing herself to get farther every time. She states feeling stronger.   Pt has returned since being out due to [place]. She is somewhat weak and not doing as well as she was before. We have adjusted her prescription to reflect that and will monitor her and progress as needed.  Pt. has done well in the program, she has completed 30 sessions. She has gone down to level 2 from 3 on both the NuStep and Arm Ergometer since returning from the [place] break but she is working hard to regain her strength she lost and increase her workloads.       Exercise Goals and Review:   Exercise Goals Re-Evaluation : Exercise Goals Re-Evaluation    Row Name 06/21/18 1434 07/10/18 1117 11/07/18 1300         Exercise Goal Re-Evaluation   Exercise Goals Review  Increase Physical Activity;Increase Strength and Stamina;Able to understand and use Dyspnea scale;Able to understand and use rate of perceived exertion (RPE) scale;Knowledge and understanding of Target Heart Rate Range (THRR);Able to check pulse independently;Improve claudication pain tolerance and improve walking ability;Understanding of Exercise Prescription  Increase Physical Activity;Increase Strength and Stamina;Able to understand and use Dyspnea scale;Able to understand and use rate of perceived exertion (RPE) scale;Knowledge and understanding of Target Heart Rate Range (THRR);Able to check pulse independently;Improve claudication pain tolerance and improve walking ability;Understanding of Exercise Prescription  Increase Physical Activity;Increase Strength and Stamina;Able to understand and use rate of perceived exertion (RPE) scale;Able to understand and use Dyspnea scale;Knowledge and understanding of Target Heart Rate Range (THRR);Able to check pulse independently;Understanding of Exercise Prescription     Comments  Pt. continues to do well in the program. Since our  talk about pushing herself she has tried her best to get at least a mile on the NuStep every session. She states this has helped her to be alble to do more at home before needing to stop and take a break.   Pt. continues to do well in the program. She still pushes herself to increase her distances on both machines every session.   Pt. has done well in Pulmonary Rehab. She says this helps her to remain active and social, which she enjoys getting out of the house.     Expected Outcomes  increase strength and stamina.   increase strength and stamina.   Short: increase overall activity level. Long: increase strength and stamina  Discharge Exercise Prescription (Final Exercise Prescription Changes): Exercise Prescription Changes - 11/23/18 1100      Response to Exercise   Blood Pressure (Admit)  124/62    Blood Pressure (Exercise)  150/60    Blood Pressure (Exit)  128/64    Heart Rate (Admit)  98 bpm    Heart Rate (Exercise)  107 bpm    Heart Rate (Exit)  80 bpm    Oxygen Saturation (Admit)  96 %    Oxygen Saturation (Exercise)  92 %    Oxygen Saturation (Exit)  98 %    Rating of Perceived Exertion (Exercise)  14    Perceived Dyspnea (Exercise)  15    Comments  Post 6MW test    Duration  Continue with 30 min of aerobic exercise without signs/symptoms of physical distress.    Intensity  THRR unchanged       Nutrition:  Target Goals: Understanding of nutrition guidelines, daily intake of sodium <1542m, cholesterol <2063m calories 30% from fat and 7% or less from saturated fats, daily to have 5 or more servings of fruits and vegetables.  Biometrics:  Post Biometrics - 11/23/18 1127       Post  Biometrics   Height  5' 5"  (1.651 m)    Weight  101.8 kg    Waist Circumference  46 inches    Hip Circumference  57 inches    Waist to Hip Ratio  0.81 %    BMI (Calculated)  37.35    Triceps Skinfold  27 mm    % Body Fat  48.2 %    Grip Strength  15.8 kg    Single Leg Stand  9.2  seconds       Nutrition Therapy Plan and Nutrition Goals: Nutrition Therapy & Goals - 11/30/18 1307      Personal Nutrition Goals   Comments  Patient is discharged from the program. She lost 1 lbs overall in the program. Her DM is controlled. She feels she has met her nutritional goal.       Nutrition Assessments: Nutrition Assessments - 11/23/18 1432      MEDFICTS Scores   Pre Score  53    Post Score  39    Score Difference  -14       Nutrition Goals Re-Evaluation:   Nutrition Goals Discharge (Final Nutrition Goals Re-Evaluation):   Psychosocial: Target Goals: Acknowledge presence or absence of significant depression and/or stress, maximize coping skills, provide positive support system. Participant is able to verbalize types and ability to use techniques and skills needed for reducing stress and depression.  Initial Review & Psychosocial Screening:   Quality of Life Scores: Quality of Life - 11/28/18 1258      Quality of Life   Select  Quality of Life      Quality of Life Scores   Health/Function Pre  22.37 %    Health/Function Post  19.36 %    Health/Function % Change  -13.46 %    Socioeconomic Pre  23.33 %    Socioeconomic Post  19.75 %    Socioeconomic % Change   -15.35 %    Psych/Spiritual Pre  22.79 %    Psych/Spiritual Post  21.79 %    Psych/Spiritual % Change  -4.39 %    Family Pre  22.9 %    Family Post  20.3 %    Family % Change  -11.35 %    GLOBAL Pre  22.71 %    GLOBAL  Post  20.11 %    GLOBAL % Change  -11.45 %      Scores of 19 and below usually indicate a poorer quality of life in these areas.  A difference of  2-3 points is a clinically meaningful difference.  A difference of 2-3 points in the total score of the Quality of Life Index has been associated with significant improvement in overall quality of life, self-image, physical symptoms, and general health in studies assessing change in quality of life.   PHQ-9: Recent Review Flowsheet  Data    Depression screen [facility] 2/9 11/23/2018 04/25/2018 03/15/2018 11/11/2017 07/07/2017   Decreased Interest 0 0 0 0 0   Down, Depressed, Hopeless 1 0 0 0 0   PHQ - 2 Score 1 0 0 0 0   Altered sleeping 2 - 0 - -   Tired, decreased energy 2 - 3 - -   Feeling bad or failure about yourself  0 - 0 - -   Trouble concentrating 0 - 0 - -   Moving slowly or fidgety/restless 0 - 0 - -   Suicidal thoughts 0 - 0 - -   PHQ-9 Score 5 - 3 - -   Difficult doing work/chores Not difficult at all - Somewhat difficult - -     Interpretation of Total Score  Total Score Depression Severity:  1-4 = Minimal depression, 5-9 = Mild depression, 10-14 = Moderate depression, 15-19 = Moderately severe depression, 20-27 = Severe depression   Psychosocial Evaluation and Intervention: Psychosocial Evaluation - 11/30/18 1301      Discharge Psychosocial Assessment & Intervention   Comments  Patient has no psychosoial issues identified at discharge. Her discharge QOL score decreased by 11.46% at 20.11% and her PHQ-9 score went from 3 to 5. She says her scores probably decreaased due to her husband's recent surgery and her having to be his caregiver.       Psychosocial Re-Evaluation: Psychosocial Re-Evaluation    [place] Name 06/22/18 1442 07/10/18 1557 11/07/18 1357         Psychosocial Re-Evaluation   Current issues with  None Identified  None Identified  None Identified     Comments  Patient's initial QOL score was 22.71 and her PHQ-9 score was 3 with no psychosocial issues identified.   Patient's initial QOL score was 22.71 and her PHQ-9 score was 3 with no psychosocial issues identified.   Patient's initial QOL score was 22.71 and her PHQ-9 score was 3 with no psychosocial issues identified.      Expected Outcomes  Patient will have no psychosocial issues identified at discharge.   Patient will have no psychosocial issues identified at discharge.   Patient will have no psychosocial issues identified at discharge.       Interventions  Encouraged to attend Pulmonary Rehabilitation for the exercise;Relaxation education;Stress management education  Encouraged to attend Pulmonary Rehabilitation for the exercise;Relaxation education;Stress management education  Encouraged to attend Pulmonary Rehabilitation for the exercise;Relaxation education;Stress management education     Continue Psychosocial Services   No Follow up required  No Follow up required  No Follow up required        Psychosocial Discharge (Final Psychosocial Re-Evaluation): Psychosocial Re-Evaluation - 11/07/18 1357      Psychosocial Re-Evaluation   Current issues with  None Identified    Comments  Patient's initial QOL score was 22.71 and her PHQ-9 score was 3 with no psychosocial issues identified.     Expected Outcomes  Patient will  have no psychosocial issues identified at discharge.     Interventions  Encouraged to attend Pulmonary Rehabilitation for the exercise;Relaxation education;Stress management education    Continue Psychosocial Services   No Follow up required        Education: Education Goals: Education classes will be provided on a weekly basis, covering required topics. Participant will state understanding/return demonstration of topics presented.  Learning Barriers/Preferences:   Education Topics: How Lungs Work and Diseases: - Discuss the anatomy of the lungs and diseases that can affect the lungs, such as COPD.   PULMONARY REHAB OTHER RESPIRATORY from 11/09/2018 in [place]  Date  05/25/18  Educator  Wynetta Emery  Instruction Review Code  2- Demonstrated Understanding      Exercise: -Discuss the importance of exercise, FITT principles of exercise, normal and abnormal responses to exercise, and how to exercise safely.   Environmental Irritants: -Discuss types of environmental irritants and how to limit exposure to environmental irritants.   PULMONARY REHAB OTHER RESPIRATORY from 11/09/2018 in  [place]  Date  06/01/18  Educator  Wynetta Emery  Instruction Review Code  2- Demonstrated Understanding      Meds/Inhalers and oxygen: - Discuss respiratory medications, definition of an inhaler and oxygen, and the proper way to use an inhaler and oxygen.   PULMONARY REHAB OTHER RESPIRATORY from 11/09/2018 in [place]  Date  06/08/18  Educator  Wynetta Emery      Energy Saving Techniques: - Discuss methods to conserve energy and decrease shortness of breath when performing activities of daily living.    PULMONARY REHAB OTHER RESPIRATORY from 11/09/2018 in [place]  Date  11/09/18  Educator  DJ  Instruction Review Code  2- Demonstrated Understanding      Bronchial Hygiene / Breathing Techniques: - Discuss breathing mechanics, pursed-lip breathing technique,  proper posture, effective ways to clear airways, and other functional breathing techniques   PULMONARY REHAB OTHER RESPIRATORY from 11/09/2018 in [place]  Date  03/23/18  Educator  Wynetta Emery  Instruction Review Code  2- Demonstrated Understanding      Cleaning Equipment: - Provides group verbal and written instruction about the health risks of elevated stress, cause of high stress, and healthy ways to reduce stress.   PULMONARY REHAB OTHER RESPIRATORY from 11/09/2018 in [place]  Date  03/30/18  Educator  Wynetta Emery  Instruction Review Code  2- Demonstrated Understanding      Nutrition I: Fats: - Discuss the types of cholesterol, what cholesterol does to the body, and how cholesterol levels can be controlled.   PULMONARY REHAB OTHER RESPIRATORY from 11/09/2018 in [place]  Date  04/06/18  Educator  Wynetta Emery  Instruction Review Code  2- Demonstrated Understanding      Nutrition II: Labels: -Discuss the different components of food labels and how to read food labels.   PULMONARY REHAB  OTHER RESPIRATORY from 11/09/2018 in [place]  Date  04/20/18  Educator  DWynetta Emery  Instruction Review Code  2- Demonstrated Understanding      Respiratory Infections: - Discuss the signs and symptoms of respiratory infections, ways to prevent respiratory infections, and the importance of seeking medical treatment when having a respiratory infection.   PULMONARY REHAB OTHER RESPIRATORY from 11/09/2018 in [place]  Date  04/27/18  Educator  Wynetta Emery  Instruction Review Code  2- Demonstrated Understanding      Stress I: Signs and Symptoms: -  Discuss the causes of stress, how stress may lead to anxiety and depression, and ways to limit stress.   PULMONARY REHAB OTHER RESPIRATORY from 11/09/2018 in [place]  Date  05/04/18  Educator  MB  Instruction Review Code  2- Demonstrated Understanding      Stress II: Relaxation: -Discuss relaxation techniques to limit stress.   PULMONARY REHAB OTHER RESPIRATORY from 11/09/2018 in [place]  Date  05/11/18  Educator  Wynetta Emery  Instruction Review Code  2- Demonstrated Understanding      Oxygen for Home/Travel: - Discuss how to prepare for travel when on oxygen and proper ways to transport and store oxygen to ensure safety.   PULMONARY REHAB OTHER RESPIRATORY from 11/09/2018 in [place]  Date  05/18/18  Educator  DJ  Instruction Review Code  2- Demonstrated Understanding      Knowledge Questionnaire Score: Knowledge Questionnaire Score - 11/23/18 1432      Knowledge Questionnaire Score   Pre Score  23/24    Post Score  15/18       Core Components/Risk Factors/Patient Goals at Admission:   Core Components/Risk Factors/Patient Goals Review:  Goals and Risk Factor Review    Row Name 06/22/18 1440 07/10/18 1552 11/07/18 1354 11/30/18 1303       Core Components/Risk Factors/Patient Goals Review   Personal Goals  Review  Weight Management/Obesity;Improve shortness of breath with ADL's;Diabetes Increase strength and stamina.   Weight Management/Obesity;Improve shortness of breath with ADL's;Diabetes Increase strength and stamina.   Weight Management/Obesity;Improve shortness of breath with ADL's;Diabetes Increase strength and stamina.  Weight Management/Obesity;Improve shortness of breath with ADL's;Diabetes Get more stamina; continue to gain strength and stamina.    Review  Patient has completed 25 sessions losing 1 lb since last 30 day review. She continues to do well in the program with progression. Her last A1C was 04/21/18 at 6.5 mg/dl. Her reported fasting glucose readings continue to average 125. 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 Will continue to monitor for progress.   Patient has completed 26 sessions maintaining her weight since last 30 day review. She continues to do well in the program with progression. No recent A1C's on file. Her reported fasting glucose readings average 130 mg/dl. She says her strength and stamina have improved and she feels the program has helped her improve overall. She also reports breathing better. Outpatient pulmonary rehab services was suspended 07/03/18 due to COVID-19 restrictions. Will continue to monitor.   Pulmonary rehab closed 07/07/18 and reopened 10/16/18. Patient returned to the program maintaining her weight during the closure. She has completed 30 sessions. She is doing well in the program with progression. She says she feels stronger and has more energy and feels the program helps her mentally by getting out of the house. She is glad to be back and feels the program is benefiting her. Will continue to monitor for progress.  Patient graduated with 36 sessions losing 1 lb overall. She did well in the program. Her reported glucose readings improved through-out the program. Her last A1C was 09/12/18 at 6.4  mg/dl. She says she does have more energy and feels her stamina has improved. She feels the program really helped her accomplish her goals. She plans to  join our [employer] program to continue exercise for at least one month and maybe longer. PR will f/u for one year.    Expected  Outcomes  Patient will continue to attend sessions and complete the program meeting her personal goals.   Patient will continue to attend sessions and complete the program meeting her personal goals.   Patient will continue to attend sessions and complete the program meeting her personal goals.   Patient will continue to exercise and continue to meet her personal goals.       Core Components/Risk Factors/Patient Goals at Discharge (Final Review):  Goals and Risk Factor Review - 11/30/18 1303      Core Components/Risk Factors/Patient Goals Review   Personal Goals Review  Weight Management/Obesity;Improve shortness of breath with ADL's;Diabetes   Get more stamina; continue to gain strength and stamina.   Review  Patient graduated with 36 sessions losing 1 lb overall. She did well in the program. Her reported glucose readings improved through-out the program. Her last A1C was 09/12/18 at 6.4 mg/dl. She says she does have more energy and feels her stamina has improved. She feels the program really helped her accomplish her goals. She plans to  join our [employer] program to continue exercise for at least one month and maybe longer. PR will f/u for one year.    Expected Outcomes  Patient will continue to exercise and continue to meet her personal goals.       ITP Comments: ITP Comments    Row Name 07/10/18 1550           ITP Comments  Pulmonary rehab services was suspended 07/03/18 due to COVID-19 restrictions. Services will resume when restrictions are lifted.           Comments: Patient graduated from Pulmonary Rehabilitation today on 11/28/18 after completing 36 sessions. She achieved LTG of  30 minutes of aerobic exercise at Max Met level of 2.1. All patients vitals are WNL. Patient has met with dietician. Discharge instruction has been reviewed in detail and patient stated an understanding of material given. Patient plans to join our [employer] program to continue exercising. Pulmonary Rehab staff will make f/u calls at 1 month, 6 months, and 1 year. Patient had no complaints of any abnormal S/S or pain on their exit visit.

## 2018-12-05 ENCOUNTER — Other Ambulatory Visit: Payer: Self-pay | Admitting: Family Medicine

## 2018-12-05 NOTE — Telephone Encounter (Signed)
Ok to refill Zanaflex? 

## 2018-12-18 DIAGNOSIS — E119 Type 2 diabetes mellitus without complications: Secondary | ICD-10-CM | POA: Diagnosis not present

## 2018-12-18 LAB — HM DIABETES EYE EXAM

## 2018-12-26 ENCOUNTER — Encounter: Payer: Self-pay | Admitting: Family Medicine

## 2018-12-26 ENCOUNTER — Ambulatory Visit (INDEPENDENT_AMBULATORY_CARE_PROVIDER_SITE_OTHER): Payer: Medicare Other | Admitting: *Deleted

## 2018-12-26 ENCOUNTER — Other Ambulatory Visit: Payer: Self-pay

## 2018-12-26 ENCOUNTER — Ambulatory Visit (INDEPENDENT_AMBULATORY_CARE_PROVIDER_SITE_OTHER): Payer: Medicare Other | Admitting: Family Medicine

## 2018-12-26 VITALS — BP 124/68 | HR 83 | Temp 98.5°F | Resp 12 | Ht 65.0 in | Wt 225.0 lb

## 2018-12-26 DIAGNOSIS — E1141 Type 2 diabetes mellitus with diabetic mononeuropathy: Secondary | ICD-10-CM

## 2018-12-26 DIAGNOSIS — E1143 Type 2 diabetes mellitus with diabetic autonomic (poly)neuropathy: Secondary | ICD-10-CM

## 2018-12-26 DIAGNOSIS — E782 Mixed hyperlipidemia: Secondary | ICD-10-CM

## 2018-12-26 DIAGNOSIS — N182 Chronic kidney disease, stage 2 (mild): Secondary | ICD-10-CM | POA: Diagnosis not present

## 2018-12-26 DIAGNOSIS — I10 Essential (primary) hypertension: Secondary | ICD-10-CM | POA: Diagnosis not present

## 2018-12-26 DIAGNOSIS — I441 Atrioventricular block, second degree: Secondary | ICD-10-CM

## 2018-12-26 DIAGNOSIS — Z23 Encounter for immunization: Secondary | ICD-10-CM | POA: Diagnosis not present

## 2018-12-26 LAB — CUP PACEART REMOTE DEVICE CHECK
Battery Remaining Longevity: 100 mo
Battery Remaining Percentage: 95.5 %
Battery Voltage: 3.01 V
Brady Statistic AP VP Percent: 8.4 %
Brady Statistic AP VS Percent: 1 %
Brady Statistic AS VP Percent: 91 %
Brady Statistic AS VS Percent: 1 %
Brady Statistic RA Percent Paced: 8.2 %
Brady Statistic RV Percent Paced: 99 %
Date Time Interrogation Session: 20200907060016
Implantable Lead Implant Date: 20190306
Implantable Lead Implant Date: 20190306
Implantable Lead Location: 753859
Implantable Lead Location: 753860
Implantable Lead Model: 5076
Implantable Lead Model: 5076
Implantable Pulse Generator Implant Date: 20190306
Lead Channel Impedance Value: 340 Ohm
Lead Channel Impedance Value: 430 Ohm
Lead Channel Pacing Threshold Amplitude: 0.5 V
Lead Channel Pacing Threshold Amplitude: 0.75 V
Lead Channel Pacing Threshold Pulse Width: 0.5 ms
Lead Channel Pacing Threshold Pulse Width: 0.5 ms
Lead Channel Sensing Intrinsic Amplitude: 3.2 mV
Lead Channel Sensing Intrinsic Amplitude: 7.4 mV
Lead Channel Setting Pacing Amplitude: 2 V
Lead Channel Setting Pacing Amplitude: 2.5 V
Lead Channel Setting Pacing Pulse Width: 0.5 ms
Lead Channel Setting Sensing Sensitivity: 2 mV
Pulse Gen Model: 2272
Pulse Gen Serial Number: 9001027

## 2018-12-26 NOTE — Assessment & Plan Note (Signed)
Well controlled no changes 

## 2018-12-26 NOTE — Progress Notes (Signed)
   Subjective:    Patient ID: Julie Clay, female    DOB: [birth year], 78 y.o.   MRN: [id number]  Patient presents for Follow-up (is not fasting) and Injections (flu shot)      DM- last A1C 6.5% in Jan, taking trulicity 0.75mg  weekly, metformin and glipizide, CBG range highest 300 after eating  -  Fasting on average 110-135   72 was the lowest, at that time she felt shakey, ate something with sugar and it came up      HTN-  BP at home range has been 120-140/80's    Hyperlipidemia with chronic elevated TG-  is on Crestor 5mg  ,LDL at goal    Asthma no difficulties not using dulera, wants to see how she does off the inhaler, due for flu shot   CHF with Pacer- has appt in June, has EP appt now yearly , no concerns, taking diuretic, she was concerned about POTS, her daughter and grand-daughter diagnosed with this- she will discuss with cardiologist    Review Of Systems:  GEN- denies fatigue, fever, weight loss,weakness, recent illness HEENT- denies eye drainage, change in vision, nasal discharge, CVS- denies chest pain, palpitations RESP- denies SOB, cough, wheeze ABD- denies N/V, change in stools, abd pain GU- denies dysuria, hematuria, dribbling, incontinence MSK- denies joint pain, muscle aches, injury Neuro- denies headache, dizziness, syncope, seizure activity       Objective:    BP 124/68   Pulse 83   Temp 98.5 F (36.9 C) (Oral)   Resp 12   Ht 5\' 5"  (1.651 m)   Wt 225 lb (102.1 kg)   SpO2 96%   BMI 37.44 kg/m  GEN- NAD, alert and oriented x3 HEENT- PERRL, EOMI, non injected sclera, pink conjunctiva, MMM, oropharynx clear Neck- Supple, no thryomegaly, no bruit, CVS- RRR, no murmur RESP-CTAB ABD-NABS,soft,NT,ND,  EXT- no edema, decreased monofilament bilat great toes, area 5, no ulcers noted  Pulses- Radial, DP- 2+         Assessment & Plan:      Problem List Items Addressed This Visit      Unprioritized   CKD (chronic kidney disease), stage II   Recheck renal function      Diabetic neuropathy (HCC)   DM (diabetes mellitus) ([place])    Has been fairly well controlled Will leth er ride up some due to hypoglycemia episodes No change to meds today Recheck A1C      Relevant Orders   Hemoglobin A1c   Essential hypertension, benign - Primary    Well controlled no changes       Relevant Orders   CBC with Differential/Platelet   Comprehensive metabolic panel   Hyperlipidemia    LDL at goal on crestor, recheck at next visit       Morbidly obese ([place])    Other Visit Diagnoses    Need for immunization against influenza       Relevant Orders   Flu Vaccine QUAD High Dose(Fluad) (Completed)      Note: This dictation was prepared with Dragon dictation along with smaller phrase technology. Any transcriptional errors that result from this process are unintentional.

## 2018-12-26 NOTE — Assessment & Plan Note (Signed)
LDL at goal on crestor, recheck at next visit

## 2018-12-26 NOTE — Assessment & Plan Note (Signed)
Has been fairly well controlled Will leth er ride up some due to hypoglycemia episodes No change to meds today Recheck A1C

## 2018-12-26 NOTE — Patient Instructions (Addendum)
Flu shot given We will call with lab results  Trial off dulera  F/U 4 months

## 2018-12-26 NOTE — Assessment & Plan Note (Signed)
Recheck renal function. ?

## 2018-12-27 LAB — COMPREHENSIVE METABOLIC PANEL
AG Ratio: 1.5 (calc) (ref 1.0–2.5)
ALT: 14 U/L (ref 6–29)
AST: 18 U/L (ref 10–35)
Albumin: 4.1 g/dL (ref 3.6–5.1)
Alkaline phosphatase (APISO): 48 U/L (ref 37–153)
BUN: 21 mg/dL (ref 7–25)
CO2: 29 mmol/L (ref 20–32)
Calcium: 10 mg/dL (ref 8.6–10.4)
Chloride: 102 mmol/L (ref 98–110)
Creat: 0.87 mg/dL (ref 0.60–0.93)
Globulin: 2.7 g/dL (calc) (ref 1.9–3.7)
Glucose, Bld: 74 mg/dL (ref 65–99)
Potassium: 4.3 mmol/L (ref 3.5–5.3)
Sodium: 145 mmol/L (ref 135–146)
Total Bilirubin: 0.4 mg/dL (ref 0.2–1.2)
Total Protein: 6.8 g/dL (ref 6.1–8.1)

## 2018-12-27 LAB — CBC WITH DIFFERENTIAL/PLATELET
Absolute Monocytes: 788 cells/uL (ref 200–950)
Basophils Absolute: 51 cells/uL (ref 0–200)
Basophils Relative: 0.5 %
Eosinophils Absolute: 242 cells/uL (ref 15–500)
Eosinophils Relative: 2.4 %
HCT: 41.7 % (ref 35.0–45.0)
Hemoglobin: 13.6 g/dL (ref 11.7–15.5)
Lymphs Abs: 2646 cells/uL (ref 850–3900)
MCH: 26.9 pg — ABNORMAL LOW (ref 27.0–33.0)
MCHC: 32.6 g/dL (ref 32.0–36.0)
MCV: 82.6 fL (ref 80.0–100.0)
MPV: 11.5 fL (ref 7.5–12.5)
Monocytes Relative: 7.8 %
Neutro Abs: 6373 cells/uL (ref 1500–7800)
Neutrophils Relative %: 63.1 %
Platelets: 270 10*3/uL (ref 140–400)
RBC: 5.05 10*6/uL (ref 3.80–5.10)
RDW: 14 % (ref 11.0–15.0)
Total Lymphocyte: 26.2 %
WBC: 10.1 10*3/uL (ref 3.8–10.8)

## 2018-12-27 LAB — HEMOGLOBIN A1C
Hgb A1c MFr Bld: 6.4 % of total Hgb — ABNORMAL HIGH (ref <5.7)
Mean Plasma Glucose: 137 (calc)
eAG (mmol/L): 7.6 (calc)

## 2018-12-28 ENCOUNTER — Other Ambulatory Visit: Payer: Self-pay | Admitting: *Deleted

## 2018-12-28 ENCOUNTER — Encounter: Payer: Self-pay | Admitting: *Deleted

## 2018-12-28 MED ORDER — GLIPIZIDE 10 MG PO TABS: 5.0000 mg | ORAL_TABLET | Freq: Every day | ORAL | 0 refills | Status: DC

## 2019-01-04 ENCOUNTER — Other Ambulatory Visit: Payer: Self-pay | Admitting: Family Medicine

## 2019-01-08 ENCOUNTER — Other Ambulatory Visit: Payer: Self-pay | Admitting: Family Medicine

## 2019-01-11 ENCOUNTER — Encounter: Payer: Self-pay | Admitting: Cardiology

## 2019-01-11 NOTE — Progress Notes (Signed)
Remote pacemaker transmission.   

## 2019-01-23 ENCOUNTER — Other Ambulatory Visit: Payer: Self-pay

## 2019-01-23 MED ORDER — TORSEMIDE 20 MG PO TABS: 20.0000 mg | ORAL_TABLET | Freq: Every day | ORAL | 3 refills | Status: DC

## 2019-01-23 MED ORDER — METOPROLOL SUCCINATE ER 25 MG PO TB24: 12.5000 mg | ORAL_TABLET | Freq: Every day | ORAL | 3 refills | Status: DC

## 2019-01-23 NOTE — Telephone Encounter (Signed)
Refilled toprol and torsemide

## 2019-02-05 ENCOUNTER — Other Ambulatory Visit: Payer: Self-pay | Admitting: Family Medicine

## 2019-02-07 ENCOUNTER — Other Ambulatory Visit: Payer: Self-pay | Admitting: Family Medicine

## 2019-02-07 NOTE — Telephone Encounter (Signed)
Requested Prescriptions   Pending Prescriptions Disp Refills  . tiZANidine (ZANAFLEX) 4 MG tablet [Pharmacy Med Name: tiZANidine HCl 4 MG Oral Tablet] 30 tablet 0    Sig: TAKE 1/2 (ONE-HALF) TABLET BY MOUTH EVERY 6 HOURS AS NEEDED FOR MUSCLE SPASM    Last OV 12/26/2018  Last written 12/05/2018

## 2019-02-25 ENCOUNTER — Other Ambulatory Visit: Payer: Self-pay | Admitting: Family Medicine

## 2019-03-02 ENCOUNTER — Other Ambulatory Visit: Payer: Self-pay | Admitting: Family Medicine

## 2019-03-16 ENCOUNTER — Other Ambulatory Visit: Payer: Self-pay | Admitting: Family Medicine

## 2019-03-20 ENCOUNTER — Other Ambulatory Visit: Payer: Self-pay

## 2019-03-20 ENCOUNTER — Other Ambulatory Visit: Payer: Medicare Other

## 2019-03-23 ENCOUNTER — Other Ambulatory Visit: Payer: Self-pay

## 2019-03-23 ENCOUNTER — Ambulatory Visit (INDEPENDENT_AMBULATORY_CARE_PROVIDER_SITE_OTHER): Payer: Medicare Other | Admitting: Family Medicine

## 2019-03-23 ENCOUNTER — Encounter: Payer: Self-pay | Admitting: Family Medicine

## 2019-03-23 VITALS — BP 102/60 | HR 80 | Temp 98.7°F | Resp 14 | Ht 65.0 in | Wt 225.0 lb

## 2019-03-23 DIAGNOSIS — I10 Essential (primary) hypertension: Secondary | ICD-10-CM

## 2019-03-23 DIAGNOSIS — E782 Mixed hyperlipidemia: Secondary | ICD-10-CM | POA: Diagnosis not present

## 2019-03-23 DIAGNOSIS — K760 Fatty (change of) liver, not elsewhere classified: Secondary | ICD-10-CM

## 2019-03-23 DIAGNOSIS — E1141 Type 2 diabetes mellitus with diabetic mononeuropathy: Secondary | ICD-10-CM

## 2019-03-23 DIAGNOSIS — E1143 Type 2 diabetes mellitus with diabetic autonomic (poly)neuropathy: Secondary | ICD-10-CM | POA: Diagnosis not present

## 2019-03-23 DIAGNOSIS — N182 Chronic kidney disease, stage 2 (mild): Secondary | ICD-10-CM | POA: Diagnosis not present

## 2019-03-23 NOTE — Assessment & Plan Note (Signed)
Controlled no changes 

## 2019-03-23 NOTE — Assessment & Plan Note (Signed)
Continue statin drug   

## 2019-03-23 NOTE — Progress Notes (Signed)
   Subjective:    Patient ID: Julie Clay, female    DOB: 1940/05/06, 78 y.o.   MRN: [id number]  Patient presents for Follow-up (is fasting) and DM (fasting CBG elevated in AM (118- 180))   Pthere to f/u chronic medical problems    DM- last A1C  6.4% in Sept, her fasting blood sugars have been elevated up to 180. I had decreased glipizide down to once a day with breakfast at last visit   Hyperlipidmeia with chronically elevated TG- last done 6 months ago, LDL  60 , TG 218    HTN-  BP at home range has been 120-140/80's    Hyperlipidemia with chronic elevated TG-  is on Crestor 5mg  ,LDL at goal    Asthma no difficulties not using dulera, wants to see how she does off the inhaler, due for flu shot   CHF with Pacer- has appt in June, has EP appt now yearly , no concerns, taking diuretic, she was concerned about POTS, her daughter and grand-daughter diagnosed with this- she will discuss with cardiologist   Review Of Systems:  GEN- denies fatigue, fever, weight loss,weakness, recent illness HEENT- denies eye drainage, change in vision, nasal discharge, CVS- denies chest pain, palpitations RESP- denies SOB, cough, wheeze ABD- denies N/V, change in stools, abd pain GU- denies dysuria, hematuria, dribbling, incontinence MSK- denies joint pain, muscle aches, injury Neuro- denies headache, dizziness, syncope, seizure activity       Objective:    BP 102/60   Pulse 80   Temp 98.7 F (37.1 C) (Temporal)   Resp 14   Ht 5\' 5"  (1.651 m)   Wt 225 lb (102.1 kg)   SpO2 98%   BMI 37.44 kg/m  GEN- NAD, alert and oriented x3 HEENT- PERRL, EOMI, non injected sclera, pink conjunctiva, MMM, oropharynx clear Neck- Supple, no thyromegaly CVS- RRR, no murmur RESP-CTAB ABD-NABS,soft,NT,ND EXT- trace pedal edema Pulses- Radial, DP- 2+        Assessment & Plan:      Problem List Items Addressed This Visit      Unprioritized   CKD (chronic kidney disease), stage II   Diabetic  neuropathy (HCC)   DM (diabetes mellitus) ([place])    Fasting CBG elevating, restart 5mg  glipizide at dinner Return for fasting labs next week      Relevant Orders   Hemoglobin A1c   Lipid Panel   Essential hypertension, benign - Primary    Controlled no changes      Relevant Orders   CBC with Differential   Comprehensive metabolic panel   Fatty liver disease, nonalcoholic   Hyperlipidemia    Continue statin drug       Relevant Orders   Lipid Panel      Note: This dictation was prepared with Dragon dictation along with smaller phrase technology. Any transcriptional errors that result from this process are unintentional.

## 2019-03-23 NOTE — Patient Instructions (Addendum)
F/U 4 months Wellness  F/U next week fasting  Restart the 1/2 tablet glipizide in the evening

## 2019-03-23 NOTE — Assessment & Plan Note (Signed)
Fasting CBG elevating, restart 5mg  glipizide at dinner Return for fasting labs next week

## 2019-03-26 LAB — CUP PACEART REMOTE DEVICE CHECK
Battery Remaining Longevity: 100 mo
Battery Remaining Percentage: 95.5 %
Battery Voltage: 3.01 V
Brady Statistic AP VP Percent: 6.8 %
Brady Statistic AP VS Percent: 1 %
Brady Statistic AS VP Percent: 93 %
Brady Statistic AS VS Percent: 1 %
Brady Statistic RA Percent Paced: 6.7 %
Brady Statistic RV Percent Paced: 99 %
Date Time Interrogation Session: 20201207025252
Implantable Lead Implant Date: 20190306
Implantable Lead Implant Date: 20190306
Implantable Lead Location: 753859
Implantable Lead Location: 753860
Implantable Lead Model: 5076
Implantable Lead Model: 5076
Implantable Pulse Generator Implant Date: 20190306
Lead Channel Impedance Value: 350 Ohm
Lead Channel Impedance Value: 410 Ohm
Lead Channel Pacing Threshold Amplitude: 0.5 V
Lead Channel Pacing Threshold Amplitude: 0.75 V
Lead Channel Pacing Threshold Pulse Width: 0.5 ms
Lead Channel Pacing Threshold Pulse Width: 0.5 ms
Lead Channel Sensing Intrinsic Amplitude: 3.1 mV
Lead Channel Sensing Intrinsic Amplitude: 7.4 mV
Lead Channel Setting Pacing Amplitude: 2 V
Lead Channel Setting Pacing Amplitude: 2.5 V
Lead Channel Setting Pacing Pulse Width: 0.5 ms
Lead Channel Setting Sensing Sensitivity: 2 mV
Pulse Gen Model: 2272
Pulse Gen Serial Number: 9001027

## 2019-03-27 ENCOUNTER — Ambulatory Visit (INDEPENDENT_AMBULATORY_CARE_PROVIDER_SITE_OTHER): Payer: Medicare Other | Admitting: *Deleted

## 2019-03-27 DIAGNOSIS — I441 Atrioventricular block, second degree: Secondary | ICD-10-CM | POA: Diagnosis not present

## 2019-03-30 ENCOUNTER — Telehealth: Payer: Self-pay | Admitting: Family Medicine

## 2019-03-30 ENCOUNTER — Other Ambulatory Visit: Payer: Self-pay

## 2019-03-30 ENCOUNTER — Other Ambulatory Visit: Payer: Medicare Other

## 2019-03-30 DIAGNOSIS — Z9189 Other specified personal risk factors, not elsewhere classified: Secondary | ICD-10-CM

## 2019-03-30 NOTE — Telephone Encounter (Signed)
Patient's husband called in he is having sick symptoms concerning for viral illness and Covid Will swab both for COVID 19

## 2019-04-02 ENCOUNTER — Other Ambulatory Visit: Payer: Self-pay | Admitting: Family Medicine

## 2019-04-04 ENCOUNTER — Other Ambulatory Visit: Payer: Self-pay

## 2019-04-04 ENCOUNTER — Ambulatory Visit: Payer: Medicare Other | Attending: Internal Medicine

## 2019-04-04 DIAGNOSIS — Z20822 Contact with and (suspected) exposure to covid-19: Secondary | ICD-10-CM

## 2019-04-06 ENCOUNTER — Other Ambulatory Visit: Payer: Self-pay | Admitting: Family Medicine

## 2019-04-06 LAB — NOVEL CORONAVIRUS, NAA: SARS-CoV-2, NAA: NOT DETECTED

## 2019-04-26 ENCOUNTER — Telehealth: Payer: Self-pay | Admitting: *Deleted

## 2019-04-26 ENCOUNTER — Other Ambulatory Visit: Payer: Medicare Other

## 2019-04-26 ENCOUNTER — Other Ambulatory Visit: Payer: Self-pay

## 2019-04-26 DIAGNOSIS — N182 Chronic kidney disease, stage 2 (mild): Secondary | ICD-10-CM

## 2019-04-26 DIAGNOSIS — E782 Mixed hyperlipidemia: Secondary | ICD-10-CM

## 2019-04-26 DIAGNOSIS — E1143 Type 2 diabetes mellitus with diabetic autonomic (poly)neuropathy: Secondary | ICD-10-CM | POA: Diagnosis not present

## 2019-04-26 DIAGNOSIS — I10 Essential (primary) hypertension: Secondary | ICD-10-CM | POA: Diagnosis not present

## 2019-04-26 NOTE — Telephone Encounter (Signed)
Received call from front office staff. Reports that patient was in office to have labs drawn, but was noted to have cough. Due to current COVID precautions, any persons with cough, SOB, fever, etc are not to be in office. States that this was explained to patient as per protocol, but patient was upset. States that patient reports that cough was not related to COVID.   [place] office contacted Probation officer for recommendations. Advised that per protocol, patient will need to come back after Sx resolved. Advised that if patient has concerns about cough, we will be glad to triage via phone. States that patient was upset and was yelling at staff, so staff did not want to tell her to leave. Advised to have patient speak with office manager.   Received call from office manager. Requested writer come to front desk to triage patient. Advised that patient will need to leave office since she has cough per protocol and if she has questions or concerns, we will be happy to triage via telephone. Glass blower/designer states that she was not sure what protocol was at this point as we have been seeing patient's with Sx in the office.   Requested to have protocol clarified for office staff.    Patient will have labs drawn at later date.

## 2019-04-27 ENCOUNTER — Encounter: Payer: Self-pay | Admitting: *Deleted

## 2019-04-27 ENCOUNTER — Ambulatory Visit: Payer: Medicare Other | Admitting: Family Medicine

## 2019-04-27 LAB — COMPREHENSIVE METABOLIC PANEL
AG Ratio: 1.7 (calc) (ref 1.0–2.5)
ALT: 13 U/L (ref 6–29)
AST: 16 U/L (ref 10–35)
Albumin: 4.1 g/dL (ref 3.6–5.1)
Alkaline phosphatase (APISO): 45 U/L (ref 37–153)
BUN: 17 mg/dL (ref 7–25)
CO2: 29 mmol/L (ref 20–32)
Calcium: 9.7 mg/dL (ref 8.6–10.4)
Chloride: 103 mmol/L (ref 98–110)
Creat: 0.92 mg/dL (ref 0.60–0.93)
Globulin: 2.4 g/dL (calc) (ref 1.9–3.7)
Glucose, Bld: 109 mg/dL — ABNORMAL HIGH (ref 65–99)
Potassium: 4.1 mmol/L (ref 3.5–5.3)
Sodium: 143 mmol/L (ref 135–146)
Total Bilirubin: 0.5 mg/dL (ref 0.2–1.2)
Total Protein: 6.5 g/dL (ref 6.1–8.1)

## 2019-04-27 LAB — CBC WITH DIFFERENTIAL/PLATELET
Absolute Monocytes: 818 cells/uL (ref 200–950)
Basophils Absolute: 28 cells/uL (ref 0–200)
Basophils Relative: 0.3 %
Eosinophils Absolute: 263 cells/uL (ref 15–500)
Eosinophils Relative: 2.8 %
HCT: 41.2 % (ref 35.0–45.0)
Hemoglobin: 13.2 g/dL (ref 11.7–15.5)
Lymphs Abs: 2237 cells/uL (ref 850–3900)
MCH: 26.4 pg — ABNORMAL LOW (ref 27.0–33.0)
MCHC: 32 g/dL (ref 32.0–36.0)
MCV: 82.4 fL (ref 80.0–100.0)
MPV: 11 fL (ref 7.5–12.5)
Monocytes Relative: 8.7 %
Neutro Abs: 6054 cells/uL (ref 1500–7800)
Neutrophils Relative %: 64.4 %
Platelets: 244 10*3/uL (ref 140–400)
RBC: 5 10*6/uL (ref 3.80–5.10)
RDW: 13.6 % (ref 11.0–15.0)
Total Lymphocyte: 23.8 %
WBC: 9.4 10*3/uL (ref 3.8–10.8)

## 2019-04-27 LAB — LIPID PANEL
Cholesterol: 128 mg/dL (ref <200)
HDL: 42 mg/dL — ABNORMAL LOW (ref >=50)
LDL Cholesterol (Calc): 55 mg/dL (calc)
Non-HDL Cholesterol (Calc): 86 mg/dL (calc) (ref <130)
Total CHOL/HDL Ratio: 3 (calc) (ref <5.0)
Triglycerides: 293 mg/dL — ABNORMAL HIGH (ref <150)

## 2019-04-27 LAB — HEMOGLOBIN A1C
Hgb A1c MFr Bld: 6.4 % of total Hgb — ABNORMAL HIGH (ref <5.7)
Mean Plasma Glucose: 137 (calc)
eAG (mmol/L): 7.6 (calc)

## 2019-04-27 NOTE — Progress Notes (Signed)
PPM Remote  

## 2019-04-30 ENCOUNTER — Other Ambulatory Visit: Payer: Self-pay | Admitting: Family Medicine

## 2019-05-28 ENCOUNTER — Other Ambulatory Visit: Payer: Self-pay | Admitting: Family Medicine

## 2019-06-07 ENCOUNTER — Other Ambulatory Visit: Payer: Self-pay | Admitting: Family Medicine

## 2019-06-08 NOTE — Telephone Encounter (Signed)
Ok to refill 

## 2019-06-24 ENCOUNTER — Ambulatory Visit: Payer: Medicare Other | Attending: Internal Medicine

## 2019-06-24 DIAGNOSIS — Z23 Encounter for immunization: Secondary | ICD-10-CM | POA: Insufficient documentation

## 2019-06-24 NOTE — Progress Notes (Signed)
   Covid-19 Vaccination Clinic  Name:  Julie Clay    MRN: [id number] DOB: 03/18/1941  06/24/2019  [name] was observed post Covid-19 immunization for 15 minutes without incident. She was provided with Vaccine Information Sheet and instruction to access the V-Safe system.   [name] was instructed to call 911 with any severe reactions post vaccine: Marland Kitchen Difficulty breathing  . Swelling of face and throat  . A fast heartbeat  . A bad rash all over body  . Dizziness and weakness   Immunizations Administered    Name Date Dose VIS Date Route   Pfizer COVID-19 Vaccine 06/24/2019 10:22 AM 0.3 mL 03/30/2019 Intramuscular   Manufacturer: [place]   Lot: [id number]   [place]: [id number]

## 2019-06-25 ENCOUNTER — Other Ambulatory Visit: Payer: Self-pay | Admitting: Family Medicine

## 2019-06-26 ENCOUNTER — Ambulatory Visit (INDEPENDENT_AMBULATORY_CARE_PROVIDER_SITE_OTHER): Payer: Medicare Other | Admitting: *Deleted

## 2019-06-26 DIAGNOSIS — I441 Atrioventricular block, second degree: Secondary | ICD-10-CM | POA: Diagnosis not present

## 2019-06-26 LAB — CUP PACEART REMOTE DEVICE CHECK
Battery Remaining Longevity: 101 mo
Battery Remaining Percentage: 95.5 %
Battery Voltage: 3.01 V
Brady Statistic AP VP Percent: 6 %
Brady Statistic AP VS Percent: 1 %
Brady Statistic AS VP Percent: 94 %
Brady Statistic AS VS Percent: 1 %
Brady Statistic RA Percent Paced: 5.9 %
Brady Statistic RV Percent Paced: 99 %
Date Time Interrogation Session: 20210309020013
Implantable Lead Implant Date: 20190306
Implantable Lead Implant Date: 20190306
Implantable Lead Location: 753859
Implantable Lead Location: 753860
Implantable Lead Model: 5076
Implantable Lead Model: 5076
Implantable Pulse Generator Implant Date: 20190306
Lead Channel Impedance Value: 360 Ohm
Lead Channel Impedance Value: 440 Ohm
Lead Channel Pacing Threshold Amplitude: 0.5 V
Lead Channel Pacing Threshold Amplitude: 0.75 V
Lead Channel Pacing Threshold Pulse Width: 0.5 ms
Lead Channel Pacing Threshold Pulse Width: 0.5 ms
Lead Channel Sensing Intrinsic Amplitude: 3.6 mV
Lead Channel Sensing Intrinsic Amplitude: 6.8 mV
Lead Channel Setting Pacing Amplitude: 2 V
Lead Channel Setting Pacing Amplitude: 2.5 V
Lead Channel Setting Pacing Pulse Width: 0.5 ms
Lead Channel Setting Sensing Sensitivity: 2 mV
Pulse Gen Model: 2272
Pulse Gen Serial Number: 9001027

## 2019-06-27 NOTE — Progress Notes (Signed)
PPM Remote  

## 2019-07-01 ENCOUNTER — Other Ambulatory Visit: Payer: Self-pay | Admitting: Family Medicine

## 2019-07-06 DIAGNOSIS — M25562 Pain in left knee: Secondary | ICD-10-CM | POA: Diagnosis not present

## 2019-07-15 ENCOUNTER — Ambulatory Visit: Payer: Medicare Other | Attending: Internal Medicine

## 2019-07-15 DIAGNOSIS — Z23 Encounter for immunization: Secondary | ICD-10-CM

## 2019-07-15 NOTE — Progress Notes (Signed)
   Covid-19 Vaccination Clinic  Name:  Julie Clay    MRN: [id number] DOB: [birth year]  07/15/2019  [name] was observed post Covid-19 immunization for 15 minutes without incident. She was provided with Vaccine Information Sheet and instruction to access the V-Safe system.   [name] was instructed to call 911 with any severe reactions post vaccine: Marland Kitchen Difficulty breathing  . Swelling of face and throat  . A fast heartbeat  . A bad rash all over body  . Dizziness and weakness   Immunizations Administered    Name Date Dose VIS Date Route   Pfizer COVID-19 Vaccine 07/15/2019  9:36 AM 0.3 mL 03/30/2019 Intramuscular   Manufacturer: [place]   Lot: [id number]   [place]: [id number]

## 2019-07-19 ENCOUNTER — Other Ambulatory Visit: Payer: Self-pay | Admitting: Family Medicine

## 2019-07-19 ENCOUNTER — Other Ambulatory Visit: Payer: Medicare Other

## 2019-07-19 DIAGNOSIS — E1143 Type 2 diabetes mellitus with diabetic autonomic (poly)neuropathy: Secondary | ICD-10-CM

## 2019-07-19 DIAGNOSIS — K760 Fatty (change of) liver, not elsewhere classified: Secondary | ICD-10-CM

## 2019-07-19 DIAGNOSIS — N182 Chronic kidney disease, stage 2 (mild): Secondary | ICD-10-CM

## 2019-07-19 DIAGNOSIS — I10 Essential (primary) hypertension: Secondary | ICD-10-CM

## 2019-07-19 DIAGNOSIS — E782 Mixed hyperlipidemia: Secondary | ICD-10-CM

## 2019-07-23 ENCOUNTER — Other Ambulatory Visit: Payer: Self-pay

## 2019-07-23 ENCOUNTER — Ambulatory Visit (INDEPENDENT_AMBULATORY_CARE_PROVIDER_SITE_OTHER): Payer: Medicare Other | Admitting: Family Medicine

## 2019-07-23 ENCOUNTER — Encounter: Payer: Self-pay | Admitting: Family Medicine

## 2019-07-23 VITALS — BP 110/64 | HR 82 | Temp 98.1°F | Resp 14 | Ht 65.0 in | Wt 223.0 lb

## 2019-07-23 DIAGNOSIS — E1141 Type 2 diabetes mellitus with diabetic mononeuropathy: Secondary | ICD-10-CM

## 2019-07-23 DIAGNOSIS — N182 Chronic kidney disease, stage 2 (mild): Secondary | ICD-10-CM

## 2019-07-23 DIAGNOSIS — M6281 Muscle weakness (generalized): Secondary | ICD-10-CM | POA: Diagnosis not present

## 2019-07-23 DIAGNOSIS — I10 Essential (primary) hypertension: Secondary | ICD-10-CM | POA: Diagnosis not present

## 2019-07-23 DIAGNOSIS — E1143 Type 2 diabetes mellitus with diabetic autonomic (poly)neuropathy: Secondary | ICD-10-CM | POA: Diagnosis not present

## 2019-07-23 DIAGNOSIS — K5900 Constipation, unspecified: Secondary | ICD-10-CM

## 2019-07-23 LAB — URINALYSIS, ROUTINE W REFLEX MICROSCOPIC
Bilirubin Urine: NEGATIVE
Glucose, UA: NEGATIVE
Hgb urine dipstick: NEGATIVE
Ketones, ur: NEGATIVE
Leukocytes,Ua: NEGATIVE
Nitrite: NEGATIVE
Protein, ur: NEGATIVE
Specific Gravity, Urine: 1.02 (ref 1.001–1.03)
pH: 6.5 (ref 5.0–8.0)

## 2019-07-23 NOTE — Patient Instructions (Addendum)
F/U pending results

## 2019-07-23 NOTE — Assessment & Plan Note (Signed)
Controlled no changes.  Her muscle weakness is concerning this only in the upper extremities.  She has not had any medication changes.  She feels like it is related to her diuretic is concerned about possibly her potassium that she tries to replace this by eating more potassium rich foods.  She has appointment tomorrow with her cardiologist as recommended by cardiac rehab.  I will obtain labs including TSH and magnesium.  She has not had any injury to the neck or upper extremities with regards to the weakness.  These labs will also help with regards to her chronic fatigue.  Her diabetes has been well controlled.  Regarding the constipation I think that she may be leaking looser stool around the severe constipation peer we will obtain an abdominal x-ray to evaluate the severity of this.  Also check urinalysis to ensure no UTI in the setting of the weak spells

## 2019-07-23 NOTE — Progress Notes (Signed)
We will have  Subjective:    Patient ID: Julie Clay, female    DOB: 05-31-1940, 79 y.o.   MRN: [id number]  Patient presents for Follow-up (is not fasting), Loss of Muscle Strength (is seeing cards on 4/6), and GI Issues (constipation to diarrhea)  Patient here to follow-up chronic medical problems.  Diabetes mellitus-her last A1c in January was 6.4%, renal function preserved creatinine 0.98  currently on glipizide 5mg  and metformin 1000mg  BID   And Trulicity 0.75mg  weekly   Hyperlipidemia triglycerides chronically elevated were at 293 in January LDL 55, taking Crestor 5mg  once a day   Did have increased GI issues with constipation and diarrhea.  This has been ongoing since she has been on diuretic therapy.  Severe constipation with small palpable like stools then she will have a blowout of loose stool and diarrhea.  She will get abdominal cramping for these episodes.  She has not noted any blood in the stool.  She is concerned about loss of muscle strength in her upper extremities. Denies any pain.  This is also worsened since she has been on diuretics.  Now when she is in cardiac rehab she is unable to sustain the exercises even with 2 to 3 pound weights.  She denies any neck pain or injury denies any chest pain or shortness of breath which is her weakness in the upper extremities.  She also states that she is fatigued all the time does not have any stamina to even go through the grocery store.  Review Of Systems:  GEN- denies fatigue, fever, weight loss+,weakness, recent illness HEENT- denies eye drainage, change in vision, nasal discharge, CVS- denies chest pain, palpitations RESP- denies SOB, cough, wheeze ABD- denies N/V, change in stools, abd pain GU- denies dysuria, hematuria, dribbling, incontinence MSK- denies joint pain,+ muscle aches, injury Neuro- denies headache, dizziness, syncope, seizure activity       Objective:    BP 110/64   Pulse 82   Temp 98.1 F (36.7 C)  (Temporal)   Resp 14   Ht 5\' 5"  (1.651 m)   Wt 223 lb (101.2 kg)   SpO2 97%   BMI 37.11 kg/m  GEN- NAD, alert and oriented x3 HEENT- PERRL, EOMI, non injected sclera, pink conjunctiva, MMM, oropharynx clear Neck- Supple, no thyromegaly, C spine NT  CVS- RRR, no murmur RESP-CTAB ABD-NABS,soft,NT,ND MSK fair range of motion of the upper extremities, mild lymphedema in the left upper extremity  Neuro- normal tone bilat, strength 4+/5 UE  R >L  EXT-ankle edema Pulses- Radial, DP- 2+        Assessment & Plan:      Problem List Items Addressed This Visit      Unprioritized   CKD (chronic kidney disease), stage II   Diabetic neuropathy (HCC)   DM (diabetes mellitus) ([place])   Relevant Orders   Urinalysis, Routine w reflex microscopic (Completed)   Microalbumin / creatinine urine ratio   Essential hypertension, benign - Primary    Controlled no changes.  Her muscle weakness is concerning this only in the upper extremities.  She has not had any medication changes.  She feels like it is related to her diuretic is concerned about possibly her potassium that she tries to replace this by eating more potassium rich foods.  She has appointment tomorrow with her cardiologist as recommended by cardiac rehab.  I will obtain labs including TSH and magnesium.  She has not had any injury to the neck or upper extremities  with regards to the weakness.  These labs will also help with regards to her chronic fatigue.  Her diabetes has been well controlled.  Regarding the constipation I think that she may be leaking looser stool around the severe constipation peer we will obtain an abdominal x-ray to evaluate the severity of this.  Also check urinalysis to ensure no UTI in the setting of the weak spells      Relevant Orders   CBC with Differential/Platelet   Comprehensive metabolic panel   TSH    Other Visit Diagnoses    Muscle weakness       Relevant Orders   Magnesium   Urinalysis, Routine  w reflex microscopic (Completed)   Constipation, unspecified constipation type       Relevant Orders   DG Abd 2 Views      Note: This dictation was prepared with Dragon dictation along with smaller phrase technology. Any transcriptional errors that result from this process are unintentional.

## 2019-07-24 ENCOUNTER — Encounter: Payer: Self-pay | Admitting: Student

## 2019-07-24 ENCOUNTER — Ambulatory Visit (HOSPITAL_COMMUNITY)
Admission: RE | Admit: 2019-07-24 | Discharge: 2019-07-24 | Disposition: A | Discharge status: Home / Self Care | Payer: Medicare Other | Source: Ambulatory Visit | Attending: Family Medicine | Admitting: Family Medicine

## 2019-07-24 ENCOUNTER — Ambulatory Visit (INDEPENDENT_AMBULATORY_CARE_PROVIDER_SITE_OTHER): Payer: Medicare Other | Admitting: Student

## 2019-07-24 VITALS — BP 108/62 | HR 84 | Temp 96.3°F | Ht 65.0 in | Wt 225.0 lb

## 2019-07-24 DIAGNOSIS — E782 Mixed hyperlipidemia: Secondary | ICD-10-CM | POA: Diagnosis not present

## 2019-07-24 DIAGNOSIS — R5383 Other fatigue: Secondary | ICD-10-CM | POA: Diagnosis not present

## 2019-07-24 DIAGNOSIS — I10 Essential (primary) hypertension: Secondary | ICD-10-CM | POA: Diagnosis not present

## 2019-07-24 DIAGNOSIS — I5032 Chronic diastolic (congestive) heart failure: Secondary | ICD-10-CM | POA: Diagnosis not present

## 2019-07-24 DIAGNOSIS — K5900 Constipation, unspecified: Secondary | ICD-10-CM | POA: Insufficient documentation

## 2019-07-24 DIAGNOSIS — I441 Atrioventricular block, second degree: Secondary | ICD-10-CM | POA: Diagnosis not present

## 2019-07-24 LAB — COMPREHENSIVE METABOLIC PANEL
AG Ratio: 1.9 (calc) (ref 1.0–2.5)
ALT: 14 U/L (ref 6–29)
AST: 18 U/L (ref 10–35)
Albumin: 4.3 g/dL (ref 3.6–5.1)
Alkaline phosphatase (APISO): 48 U/L (ref 37–153)
BUN: 21 mg/dL (ref 7–25)
CO2: 29 mmol/L (ref 20–32)
Calcium: 9.8 mg/dL (ref 8.6–10.4)
Chloride: 105 mmol/L (ref 98–110)
Creat: 0.85 mg/dL (ref 0.60–0.93)
Globulin: 2.3 g/dL (calc) (ref 1.9–3.7)
Glucose, Bld: 123 mg/dL — ABNORMAL HIGH (ref 65–99)
Potassium: 3.8 mmol/L (ref 3.5–5.3)
Sodium: 145 mmol/L (ref 135–146)
Total Bilirubin: 0.4 mg/dL (ref 0.2–1.2)
Total Protein: 6.6 g/dL (ref 6.1–8.1)

## 2019-07-24 LAB — CBC WITH DIFFERENTIAL/PLATELET
Absolute Monocytes: 735 cells/uL (ref 200–950)
Basophils Absolute: 19 cells/uL (ref 0–200)
Basophils Relative: 0.2 %
Eosinophils Absolute: 214 cells/uL (ref 15–500)
Eosinophils Relative: 2.3 %
HCT: 42.5 % (ref 35.0–45.0)
Hemoglobin: 13.9 g/dL (ref 11.7–15.5)
Lymphs Abs: 2558 cells/uL (ref 850–3900)
MCH: 27.3 pg (ref 27.0–33.0)
MCHC: 32.7 g/dL (ref 32.0–36.0)
MCV: 83.5 fL (ref 80.0–100.0)
MPV: 11.4 fL (ref 7.5–12.5)
Monocytes Relative: 7.9 %
Neutro Abs: 5775 cells/uL (ref 1500–7800)
Neutrophils Relative %: 62.1 %
Platelets: 243 10*3/uL (ref 140–400)
RBC: 5.09 10*6/uL (ref 3.80–5.10)
RDW: 13.8 % (ref 11.0–15.0)
Total Lymphocyte: 27.5 %
WBC: 9.3 10*3/uL (ref 3.8–10.8)

## 2019-07-24 LAB — TSH: TSH: 3.12 mIU/L (ref 0.40–4.50)

## 2019-07-24 LAB — MICROALBUMIN / CREATININE URINE RATIO
Creatinine, Urine: 17 mg/dL — ABNORMAL LOW (ref 20–275)
Microalb, Ur: <0.2 mg/dL

## 2019-07-24 LAB — MAGNESIUM: Magnesium: 1.7 mg/dL (ref 1.5–2.5)

## 2019-07-24 NOTE — Progress Notes (Signed)
Cardiology Office Note    Date:  07/24/2019   ID:  Julie Clay, DOB 1940/12/17, MRN [id number]  PCP:  Alycia Rossetti, MD  Cardiologist: Kate Sable, MD   EP: Dr. Caryl Comes  Chief Complaint  Patient presents with  . Follow-up    fatigue and weakness    History of Present Illness:    Julie Clay is a 79 y.o. 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   She was last examined by Dr. Bronson Ing in 09/2018 and denied any recent chest pain or palpitations and her breathing had been at baseline. Her husband had recently undergone CABG at the time of her visit. Weight was stable at 223 lbs and she was continued on Torsemide '10mg'$  every morning and '10mg'$  every other evening. She did have a device interrogation in 06/2019 which showed normal device function.   She was evaluated by her PCP yesterday and reported constipation and diarrhea which she felt was secondary to her diuretic. Also mentioned a loss of muscle strength along her upper extremities and worsening fatigue. Follow-up labs along with Abdominal x-ray were recommended. Labs showed WBC 9.3, Hgb 13.9, platelets 243, Na+ 145, K+ 3.8 and creatinine 0.85. TSH 3.12. Mg 1.7. UA negative.   In talking with the patient and her husband today, she reports worsening fatigue over the past few months and questions if this is possibly secondary to Torsemide. She does experience worsening constipation and diarrhea intermittently and feels like this might be secondary to the diuretic as well. She does take her evening dose of Torsemide right before going to bed due to having worsening weakness when taking all '30mg'$  in the AM.   She denies any specific dyspnea on exertion, orthopnea, PND or edema. No chest pain or palpitations. She recently started back participating in  maintenance cardiac rehab and says she has noticed weakness along her upper extremities when lifting 2 to 3 pound weights which was previously not an issue prior to the pandemic.  She also reports worsening weakness along her lower extremities.  Past Medical History:  Diagnosis Date  . Asthma    spirometry (2011)- mild ventilary defect  . Bone spur    "heels, knees, left shoulder" (06/22/2017)  . Breast cancer, left breast ([place]) dx'd 03/2011  . Chronic bronchitis ([place])   . COPD (chronic obstructive pulmonary disease) ([place])   . Fatty liver 2013   enlarged  . GERD (gastroesophageal reflux disease)   . HSV (herpes simplex virus) infection   . Hyperlipidemia   . Hypertension   . Lung nodule seen on imaging study 2013  . Lymphedema    Left arm  . 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 Osteoarthritis    "all over" (06/22/2017)  . Sleep apnea    Stop Bang score of 5. Pt has had sleep study, but was shown to be negative for sleep apnea.  . Type II diabetes mellitus ([place])   . Ventral hernia, unspecified, without mention of obstruction or gangrene     Past Surgical History:  Procedure Laterality Date  . BREAST BIOPSY Left 04/2011  . BREAST BIOPSY Left 06/22/13  . BREAST BIOPSY Left 09/12/2013   Procedure: BREAST BIOPSY WITH NEEDLE LOCALIZATION;  Surgeon: Jamesetta So, MD;  Location: [facility];  Service: General;  Laterality: Left;  . BREAST LUMPECTOMY Left 04/2011 X 2  . CATARACT EXTRACTION W/ INTRAOCULAR LENS  IMPLANT, BILATERAL Bilateral   . CATARACT EXTRACTION, BILATERAL     on different occassions   . [place]  . DILATION AND CURETTAGE OF UTERUS  11/06/2013  . HAND LIGAMENT RECONSTRUCTION Right ~ 2010  . HYSTEROSCOPY WITH D & C N/A 11/06/2013   Procedure: DILATATION AND CURETTAGE /HYSTEROSCOPY;  Surgeon: Jonnie Kind, MD;  Location: [facility];  Service: Gynecology;  Laterality: N/A;  . INSERT / REPLACE / REMOVE PACEMAKER  06/22/2017  . LUNG LOBECTOMY Right 1988    Fungal Infection  . PACEMAKER IMPLANT N/A 06/22/2017   Procedure: PACEMAKER IMPLANT;  Surgeon: Deboraha Sprang, MD;  Location: [facility];  Service: Cardiovascular;  Laterality: N/A;  . POLYPECTOMY N/A 11/06/2013   Procedure: POLYPECTOMY (REMOVAL ENDOMETRIAL POLYP);  Surgeon: Jonnie Kind, MD;  Location: [facility];  Service: Gynecology;  Laterality: N/A;  . RECTOCELE REPAIR N/A 11/06/2013   Procedure: POSTERIOR REPAIR (RECTOCELE);  Surgeon: Jonnie Kind, MD;  Location: [facility];  Service: Gynecology;  Laterality: N/A;  . SHOULDER ARTHROSCOPY WITH OPEN ROTATOR CUFF REPAIR Left 07/26/2012  . TONSILLECTOMY  1949  . TUBAL LIGATION      Current Medications: Outpatient Medications Prior to Visit  Medication Sig Dispense Refill  . acetaminophen (TYLENOL) 500 MG tablet Take 500 mg by mouth as needed for moderate pain. Takes 2 when needed    . albuterol (PROVENTIL HFA;VENTOLIN HFA) 108 (90 Base) MCG/ACT inhaler Inhale 2 puffs into the lungs every 4 (four) hours as needed. 6.7 g 6  . Aloe Vera Leaf POWD Take 800 mg by mouth 2 (two) times daily.     . Ascorbic Acid (VITAMIN C) 1000 MG tablet Take 1,000 mg by mouth 2 (two) times daily.    Marland Kitchen b complex vitamins tablet Take 1 tablet by mouth 2 (two) times daily.     . Blood Glucose Monitoring Suppl (BLOOD GLUCOSE SYSTEM PAK) KIT Use as directed to monitor FSBS 1x daily. Dx: E11.9. Please dispense as Accu-Chek Aviva 1 each 1  . Cholecalciferol (VITAMIN D3) 2000 UNITS TABS Take 2,000 Units by mouth 2 (two) times daily.    . Echinacea 380 MG CAPS Take 1 capsule by mouth as needed (cold/flue).    . fluocinonide cream (LIDEX) 3.50 % Apply 1 application topically 2 (two) times daily as needed (poison ivy/bites/hives). 30 g 11  . Ginkgo Biloba 120 MG CAPS Take 1 capsule by mouth daily.     Marland Kitchen glipiZIDE (GLUCOTROL) 10 MG tablet TAKE 1/2 (ONE-HALF) TABLET BY MOUTH TWICE DAILY BEFORE A MEAL FOR DIABETES 180 tablet 0  . Glucose Blood (BLOOD GLUCOSE TEST STRIPS)  STRP Use as directed to monitor FSBS 1x daily. Dx: E11.9. Please dispense as Accu-Chek Aviva 100 each 1  . KRILL OIL PO Take 1 capsule by mouth 2 (two) times daily.    . Lancets MISC Use as directed to monitor FSBS 1x daily. Dx: E11.9. Please dispense as Accu-Chek Aviva 100 each 1  . LORazepam (ATIVAN) 0.5 MG tablet Take 1 tablet (0.5 mg total) by mouth 2 (two) times daily as needed for anxiety. 30 tablet 1  . Melatonin 3 MG TABS Take 3 mg by mouth at bedtime as needed and may repeat dose one time if needed (sleep).     . metFORMIN (GLUCOPHAGE) 1000 MG tablet TAKE 1 TABLET BY MOUTH TWICE  DAILY WITH A MEAL FOR DIABETES 180 tablet 0  . metoprolol succinate (TOPROL-XL) 25 MG 24 hr tablet Take 0.5 tablets (12.5 mg total) by mouth daily. 45 tablet 3  . Multiple Vitamin (MULTIVITAMIN WITH MINERALS) TABS tablet Take 1 tablet by mouth daily.    . NONFORMULARY OR COMPOUNDED ITEM Apply 1 application topically 2 (two) times daily as needed. Reported on 06/10/2015    . rosuvastatin (CRESTOR) 5 MG tablet TAKE 1 TABLET BY MOUTH ONCE DAILY FOR CHOLESTEROL 90 tablet 0  . simethicone (MYLICON) 80 MG chewable tablet Chew 80 mg by mouth as needed for flatulence.     Marland Kitchen tiZANidine (ZANAFLEX) 4 MG tablet TAKE 1/2 (ONE-HALF) TABLET BY MOUTH EVERY 6 HOURS AS NEEDED FOR MUSCLE SPASM 30 tablet 0  . triamcinolone cream (KENALOG) 0.1 % Apply 1 application topically 2 (two) times daily. (Patient taking differently: Apply 1 application topically as needed. ) 30 g 0  . TRULICITY 2.35 TI/1.4ER SOPN INJECT 1 SYRINGE SUBCUTANEOUSLY ONCE A WEEK 4 mL 2  . torsemide (DEMADEX) 20 MG tablet Take 1 tablet (20 mg total) by mouth daily. 90 tablet 3   No facility-administered medications prior to visit.     Allergies:   Arimidex [anastrozole], Adhesive [tape], Lisinopril, Tetanus toxoids, and Tetracyclines & related   Social History   Socioeconomic History  . Marital status: Married    Spouse name: Not on file  . Number of children:  Not on file  . Years of education: Not on file  . Highest education level: Not on file  Occupational History  . 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 Smoking status: Never Smoker  . Smokeless tobacco: Never Used  Substance and Sexual Activity  . Alcohol use: Yes    Alcohol/week: 0.0 standard drinks    Comment: 06/22/2017 "might have a drink twice a year"  . Drug use: No  . Sexual activity: Not Currently    Birth control/protection: Post-menopausal  Other Topics Concern  . Not on file  Social History Narrative  . Not on file   Social Determinants of Health   Financial Resource Strain:   . Difficulty of Paying Living Expenses:   Food Insecurity:   . Worried About Charity fundraiser in the Last Year:   . Arboriculturist in the Last Year:   Transportation Needs:   . Film/video editor (Medical):   Marland Kitchen Lack of Transportation (Non-Medical):   Physical Activity:   . Days of Exercise per Week:   . Minutes of Exercise per Session:   Stress:   . Feeling of Stress :   Social Connections:   . Frequency of Communication with Friends and Family:   . Frequency of Social Gatherings with Friends and Family:   . Attends Religious Services:   . Active Member of Clubs or Organizations:   . Attends Archivist Meetings:   Marland Kitchen Marital Status:      Family History:  The patient's family history includes Alzheimer's disease in her paternal aunt; Cancer in her father, maternal uncle, mother, paternal grandfather, and paternal uncle; Diabetes in her father, mother, and sister; Heart disease in her father, mother, and sister; Hyperlipidemia in her father; Hypertension in her father and mother; Kidney disease in her mother; Stroke in her maternal grandfather.   Review of Systems:   Please see the history of present illness.     General:  No chills,  fever, night sweats or weight changes. Positive for fatigue and weakness.  Cardiovascular:   No chest pain, dyspnea on exertion, edema, orthopnea, palpitations, paroxysmal nocturnal dyspnea. Dermatological: No rash, lesions/masses Respiratory: No cough, dyspnea Urologic: No hematuria, dysuria Abdominal:   No nausea, vomiting, bright red blood per rectum, melena, or hematemesis. Positive for intermittent diarrhea and constipation.  Neurologic:  No visual changes,  changes in mental status. All other systems reviewed and are otherwise negative except as noted above.   Physical Exam:    VS:  BP 108/62   Pulse 84   Temp (!) 96.3 F (35.7 C)   Ht '5\' 5"'$  (1.651 m)   Wt 225 lb (102.1 kg)   SpO2 97%   BMI 37.44 kg/m    General: Well developed, well nourished,female appearing in no acute distress. Head: Normocephalic, atraumatic, sclera non-icteric.  Neck: No carotid bruits. JVD not elevated.  Lungs: Respirations regular and unlabored, without wheezes or rales.  Heart: Regular rate and rhythm. No S3 or S4.  No murmur, no rubs, or gallops appreciated. Abdomen: Soft, non-tender, non-distended. No obvious abdominal masses. Msk:  Strength and tone appear normal for age. No obvious joint deformities or effusions. Extremities: No clubbing or cyanosis. No lower extremity edema.  Distal pedal pulses are 2+ bilaterally. Neuro: Alert and oriented X 3. Moves all extremities spontaneously. No focal deficits noted. Psych:  Responds to questions appropriately with a normal affect. Skin: No rashes or lesions noted  Wt Readings from Last 3 Encounters:  07/24/19 225 lb (102.1 kg)  07/23/19 223 lb (101.2 kg)  03/23/19 225 lb (102.1 kg)     Studies/Labs Reviewed:   EKG:  EKG is not ordered today.   Recent Labs: 07/23/2019: ALT 14; BUN 21; Creat 0.85; Hemoglobin 13.9; Magnesium 1.7; Platelets 243; Potassium 3.8; Sodium 145; TSH 3.12   Lipid Panel    Component Value Date/Time   CHOL 128 04/26/2019 1127   TRIG 293 (H) 04/26/2019 1127   HDL 42 (L) 04/26/2019 1127   CHOLHDL 3.0 04/26/2019  1127   VLDL 43 (H) 09/20/2016 1015   LDLCALC 55 04/26/2019 1127   LDLDIRECT 102 (H) 12/09/2011 1035    Additional studies/ records that were reviewed today include:   Echocardiogram: 05/2017 Study Conclusions   - Left ventricle: The cavity size was normal. Wall thickness was  increased in a pattern of mild LVH. Systolic function was normal.  The estimated ejection fraction was in the range of 60% to 65%.  Wall motion was normal; there were no regional wall motion  abnormalities. Doppler parameters are consistent with abnormal  left ventricular relaxation (grade 1 diastolic dysfunction).  - Aortic valve: Mildly calcified annulus. Trileaflet; mildly  calcified leaflets. Valve area (VTI): 2.28 cm^2.  - Mitral valve: Mildly calcified annulus. There was trivial  regurgitation.  - Right atrium: Central venous pressure (est): 3 mm Hg.  - Atrial septum: No defect or patent foramen ovale was identified.  - Tricuspid valve: There was trivial regurgitation.  - Pulmonary arteries: Systolic pressure could not be accurately  estimated.  - Pericardium, extracardiac: There was no pericardial effusion.   Assessment:    1. Fatigue, unspecified type   2. 2nd degree AV block   3. Chronic diastolic heart failure ([place])   4. Essential hypertension   5. Mixed hyperlipidemia      Plan:   In order of problems listed above:  1. Fatigue/ Weakness along Extremities - While Torsemide could be contributing to her fatigue, I suspect  this is more secondary to the way she is taking it as she typically takes her evening dose right before going to bed and is unable to sleep consistently due to nocturia. Her volume status is stable on examination today and I recommended that she hold Torsemide for the next 1 to 2 weeks to see if her symptoms of fatigue along with episodic diarrhea and constipation improve as she feels like they are secondary to the diuretic. She does have a follow-up abdominal  x-ray scheduled for later today which was ordered by her PCP. - If the weakness along her upper and lower extremities does not improve, she would likely benefit from PT referral to work on strengthening exercises as her weakness is possibly due to deconditioning as she has not participated in an exercise program for the past year and just recently started back with the maintenance cardiac rehab program.   2. 2nd degree AV block  - she is s/p St. Jude PPM placement in 06/2017 which is followed by Dr. Caryl Comes. Most recent interrogation last month showed normal device function. She is overdue for routine follow-up with Dr. Caryl Comes and will ask for scheduling to assist with arranging this today.  3. History of Chronic Diastolic CHF - She has no evidence of volume overload by examination and given her multiple side effects as outlined above, will plan to stop Torsemide for the next 1 to 2 weeks to see if symptoms improve. I did encourage her to continue to follow daily weights along with reducing sodium intake.   4. HTN - BP is well controlled at 108/62 during today's visit. Continue Toprol-XL 12.5 mg daily.   5. HLD - Followed by PCP.  She remains on Crestor 5 mg daily with FLP in 04/2019 showing LDL of 55.    Medication Adjustments/Labs and Tests Ordered: Current medicines are reviewed at length with the patient today.  Concerns regarding medicines are outlined above.  Medication changes, Labs and Tests ordered today are listed in the Patient Instructions below. Patient Instructions    Medication Instructions: STOP Torsemide  Labwork: None today  Procedures/Testing: None today  Follow-Up: 6 months in office with Dr.Koneswaran  Any Additional Special Instructions Will Be Listed Below (If Applicable).   Call us back in 1-2 weeks and update [place] on symptoms      If you need a refill on your cardiac medications before your next appointment, please call your pharmacy.    Two Gram  Sodium Diet 2000 mg  What is Sodium? Sodium is a mineral found naturally in many foods. The most significant source of sodium in the diet is table salt, which is about 40% sodium.  Processed, convenience, and preserved foods also contain a large amount of sodium.  The body needs only 500 mg of sodium daily to function,  A normal diet provides more than enough sodium even if you do not use salt.  Why Limit Sodium? A build up of sodium in the body can cause thirst, increased blood pressure, shortness of breath, and water retention.  Decreasing sodium in the diet can reduce edema and risk of heart attack or stroke associated with high blood pressure.  Keep in mind that there are many other factors involved in these health problems.  Heredity, obesity, lack of exercise, cigarette smoking, stress and what you eat all play a role.  General Guidelines:  Do not add salt at the table or in cooking.  One teaspoon of salt contains over 2 grams of  sodium.  Read food labels  Avoid processed and convenience foods  Ask your dietitian before eating any foods not dicussed in the menu planning guidelines  Consult your physician if you wish to use a salt substitute or a sodium containing medication such as antacids.  Limit milk and milk products to 16 oz (2 cups) per day.  Shopping Hints:  READ LABELS!! "Dietetic" does not necessarily mean low sodium.  Salt and other sodium ingredients are often added to foods during processing.   Menu Planning Guidelines Food Group Choose More Often Avoid  Beverages (see also the milk group All fruit juices, low-sodium, salt-free vegetables juices, low-sodium carbonated beverages Regular vegetable or tomato juices, commercially softened water used for drinking or cooking  Breads and Cereals Enriched white, wheat, rye and pumpernickel bread, hard rolls and dinner rolls; muffins, cornbread and waffles; most dry cereals, cooked cereal without added salt; unsalted crackers and  breadsticks; low sodium or homemade bread crumbs Bread, rolls and crackers with salted tops; quick breads; instant hot cereals; pancakes; commercial bread stuffing; self-rising flower and biscuit mixes; regular bread crumbs or cracker crumbs  Desserts and Sweets Desserts and sweets mad with mild should be within allowance Instant pudding mixes and cake mixes  Fats Butter or margarine; vegetable oils; unsalted salad dressings, regular salad dressings limited to 1 Tbs; light, sour and heavy cream Regular salad dressings containing bacon fat, bacon bits, and salt pork; snack dips made with instant soup mixes or processed cheese; salted nuts  Fruits Most fresh, frozen and canned fruits Fruits processed with salt or sodium-containing ingredient (some dried fruits are processed with sodium sulfites        Vegetables Fresh, frozen vegetables and low- sodium canned vegetables Regular canned vegetables, sauerkraut, pickled vegetables, and others prepared in brine; frozen vegetables in sauces; vegetables seasoned with ham, bacon or salt pork  Condiments, Sauces, Miscellaneous  Salt substitute with physician's approval; pepper, herbs, spices; vinegar, lemon or lime juice; hot pepper sauce; garlic powder, onion powder, low sodium soy sauce (1 Tbs.); low sodium condiments (ketchup, chili sauce, mustard) in limited amounts (1 tsp.) fresh ground horseradish; unsalted tortilla chips, pretzels, potato chips, popcorn, salsa (1/4 cup) Any seasoning made with salt including garlic salt, celery salt, onion salt, and seasoned salt; sea salt, rock salt, kosher salt; meat tenderizers; monosodium glutamate; mustard, regular soy sauce, barbecue, sauce, chili sauce, teriyaki sauce, steak sauce, Worcestershire sauce, and most flavored vinegars; canned gravy and mixes; regular condiments; salted snack foods, olives, picles, relish, horseradish sauce, catsup   Food preparation: Try these seasonings Meats:    Pork Sage, onion  Serve with applesauce  Chicken Poultry seasoning, thyme, parsley Serve with cranberry sauce  Lamb Curry powder, rosemary, garlic, thyme Serve with mint sauce or jelly  Veal Marjoram, basil Serve with current jelly, cranberry sauce  Beef Pepper, bay leaf Serve with dry mustard, unsalted chive butter  Fish [place] leaf, dill Serve with unsalted lemon butter, unsalted parsley butter  Vegetables:    Asparagus Lemon juice   Broccoli Lemon juice   Carrots Mustard dressing parsley, mint, nutmeg, glazed with unsalted butter and sugar   Green beans Marjoram, lemon juice, nutmeg,dill seed   Tomatoes Basil, marjoram, onion   Spice /blend for [facility]" 4 tsp ground thyme 1 tsp ground sage 3 tsp ground rosemary 4 tsp ground marjoram     Signed, Erma Heritage, PA-C  07/24/2019 5:12 PM    [facility] [address],  [place] [address] Phone: [phone] Fax: [phone]

## 2019-07-24 NOTE — Patient Instructions (Addendum)
Medication Instructions: STOP Torsemide  Labwork: None today  Procedures/Testing: None today  Follow-Up: 6 months in office with Dr.Koneswaran  Any Additional Special Instructions Will Be Listed Below (If Applicable).   Call us back in 1-2 weeks and update [place] on symptoms      If you need a refill on your cardiac medications before your next appointment, please call your pharmacy.    Two Gram Sodium Diet 2000 mg  What is Sodium? Sodium is a mineral found naturally in many foods. The most significant source of sodium in the diet is table salt, which is about 40% sodium.  Processed, convenience, and preserved foods also contain a large amount of sodium.  The body needs only 500 mg of sodium daily to function,  A normal diet provides more than enough sodium even if you do not use salt.  Why Limit Sodium? A build up of sodium in the body can cause thirst, increased blood pressure, shortness of breath, and water retention.  Decreasing sodium in the diet can reduce edema and risk of heart attack or stroke associated with high blood pressure.  Keep in mind that there are many other factors involved in these health problems.  Heredity, obesity, lack of exercise, cigarette smoking, stress and what you eat all play a role.  General Guidelines:  Do not add salt at the table or in cooking.  One teaspoon of salt contains over 2 grams of sodium.  Read food labels  Avoid processed and convenience foods  Ask your dietitian before eating any foods not dicussed in the menu planning guidelines  Consult your physician if you wish to use a salt substitute or a sodium containing medication such as antacids.  Limit milk and milk products to 16 oz (2 cups) per day.  Shopping Hints:  READ LABELS!! "Dietetic" does not necessarily mean low sodium.  Salt and other sodium ingredients are often added to foods during processing.   Menu Planning Guidelines Food Group Choose More Often Avoid   Beverages (see also the milk group All fruit juices, low-sodium, salt-free vegetables juices, low-sodium carbonated beverages Regular vegetable or tomato juices, commercially softened water used for drinking or cooking  Breads and Cereals Enriched white, wheat, rye and pumpernickel bread, hard rolls and dinner rolls; muffins, cornbread and waffles; most dry cereals, cooked cereal without added salt; unsalted crackers and breadsticks; low sodium or homemade bread crumbs Bread, rolls and crackers with salted tops; quick breads; instant hot cereals; pancakes; commercial bread stuffing; self-rising flower and biscuit mixes; regular bread crumbs or cracker crumbs  Desserts and Sweets Desserts and sweets mad with mild should be within allowance Instant pudding mixes and cake mixes  Fats Butter or margarine; vegetable oils; unsalted salad dressings, regular salad dressings limited to 1 Tbs; light, sour and heavy cream Regular salad dressings containing bacon fat, bacon bits, and salt pork; snack dips made with instant soup mixes or processed cheese; salted nuts  Fruits Most fresh, frozen and canned fruits Fruits processed with salt or sodium-containing ingredient (some dried fruits are processed with sodium sulfites        Vegetables Fresh, frozen vegetables and low- sodium canned vegetables Regular canned vegetables, sauerkraut, pickled vegetables, and others prepared in brine; frozen vegetables in sauces; vegetables seasoned with ham, bacon or salt pork  Condiments, Sauces, Miscellaneous  Salt substitute with physician's approval; pepper, herbs, spices; vinegar, lemon or lime juice; hot pepper sauce; garlic powder, onion powder, low sodium soy sauce (1 Tbs.); low sodium condiments (  ketchup, chili sauce, mustard) in limited amounts (1 tsp.) fresh ground horseradish; unsalted tortilla chips, pretzels, potato chips, popcorn, salsa (1/4 cup) Any seasoning made with salt including garlic salt, celery salt,  onion salt, and seasoned salt; sea salt, rock salt, kosher salt; meat tenderizers; monosodium glutamate; mustard, regular soy sauce, barbecue, sauce, chili sauce, teriyaki sauce, steak sauce, Worcestershire sauce, and most flavored vinegars; canned gravy and mixes; regular condiments; salted snack foods, olives, picles, relish, horseradish sauce, catsup   Food preparation: Try these seasonings Meats:    Pork Sage, onion Serve with applesauce  Chicken Poultry seasoning, thyme, parsley Serve with cranberry sauce  Lamb Curry powder, rosemary, garlic, thyme Serve with mint sauce or jelly  Veal Marjoram, basil Serve with current jelly, cranberry sauce  Beef Pepper, bay leaf Serve with dry mustard, unsalted chive butter  Fish [place] leaf, dill Serve with unsalted lemon butter, unsalted parsley butter  Vegetables:    Asparagus Lemon juice   Broccoli Lemon juice   Carrots Mustard dressing parsley, mint, nutmeg, glazed with unsalted butter and sugar   Green beans Marjoram, lemon juice, nutmeg,dill seed   Tomatoes Basil, marjoram, onion   Spice /blend for [facility]" 4 tsp ground thyme 1 tsp ground sage 3 tsp ground rosemary 4 tsp ground marjoram

## 2019-07-26 ENCOUNTER — Encounter: Payer: Self-pay | Admitting: *Deleted

## 2019-07-30 ENCOUNTER — Telehealth: Payer: Self-pay

## 2019-07-30 NOTE — Telephone Encounter (Signed)
I spoke to the patient and told her to arrive around 12:30 for her 1:00 appointment.

## 2019-07-31 ENCOUNTER — Encounter: Payer: Self-pay | Admitting: Student

## 2019-07-31 ENCOUNTER — Other Ambulatory Visit: Payer: Self-pay

## 2019-07-31 ENCOUNTER — Ambulatory Visit (INDEPENDENT_AMBULATORY_CARE_PROVIDER_SITE_OTHER): Payer: Medicare Other | Admitting: Student

## 2019-07-31 VITALS — BP 136/80 | HR 78 | Ht 65.0 in | Wt 225.0 lb

## 2019-07-31 DIAGNOSIS — Z950 Presence of cardiac pacemaker: Secondary | ICD-10-CM

## 2019-07-31 DIAGNOSIS — I441 Atrioventricular block, second degree: Secondary | ICD-10-CM

## 2019-07-31 DIAGNOSIS — I5032 Chronic diastolic (congestive) heart failure: Secondary | ICD-10-CM

## 2019-07-31 DIAGNOSIS — R5383 Other fatigue: Secondary | ICD-10-CM | POA: Diagnosis not present

## 2019-07-31 LAB — CUP PACEART INCLINIC DEVICE CHECK
Battery Remaining Longevity: 102 mo
Battery Voltage: 3.01 V
Brady Statistic RA Percent Paced: 5.7 %
Brady Statistic RV Percent Paced: 99.92 %
Date Time Interrogation Session: 20210413135138
Implantable Lead Implant Date: 20190306
Implantable Lead Implant Date: 20190306
Implantable Lead Location: 753859
Implantable Lead Location: 753860
Implantable Lead Model: 5076
Implantable Lead Model: 5076
Implantable Pulse Generator Implant Date: 20190306
Lead Channel Impedance Value: 350 Ohm
Lead Channel Impedance Value: 412.5 Ohm
Lead Channel Pacing Threshold Amplitude: 0.5 V
Lead Channel Pacing Threshold Amplitude: 0.5 V
Lead Channel Pacing Threshold Amplitude: 0.75 V
Lead Channel Pacing Threshold Amplitude: 0.75 V
Lead Channel Pacing Threshold Pulse Width: 0.5 ms
Lead Channel Pacing Threshold Pulse Width: 0.5 ms
Lead Channel Pacing Threshold Pulse Width: 0.5 ms
Lead Channel Pacing Threshold Pulse Width: 0.5 ms
Lead Channel Sensing Intrinsic Amplitude: 3.9 mV
Lead Channel Sensing Intrinsic Amplitude: 5.6 mV
Lead Channel Setting Pacing Amplitude: 2 V
Lead Channel Setting Pacing Amplitude: 2.5 V
Lead Channel Setting Pacing Pulse Width: 0.5 ms
Lead Channel Setting Sensing Sensitivity: 2 mV
Pulse Gen Model: 2272
Pulse Gen Serial Number: 9001027

## 2019-07-31 NOTE — Patient Instructions (Signed)
Medication Instructions:  NONE *If you need a refill on your cardiac medications before your next appointment, please call your pharmacy*   Lab Work: NONE If you have labs (blood work) drawn today and your tests are completely normal, you will receive your results only by: Marland Kitchen MyChart Message (if you have MyChart) OR . A paper copy in the mail If you have any lab test that is abnormal or we need to change your treatment, we will call you to review the results.   Testing/Procedures: NONE   Follow-Up: At [facility], you and your health needs are our priority.  As part of our continuing mission to provide you with exceptional heart care, we have created designated Provider Care Teams.  These Care Teams include your primary Cardiologist (physician) and Advanced Practice Providers (APPs -  Physician Assistants and Nurse Practitioners) who all work together to provide you with the care you need, when you need it.  We recommend signing up for the patient portal called "MyChart".  Sign up information is provided on this After Visit Summary.  MyChart is used to connect with patients for Virtual Visits (Telemedicine).  Patients are able to view lab/test results, encounter notes, upcoming appointments, etc.  Non-urgent messages can be sent to your provider as well.   To learn more about what you can do with MyChart, go to [email or web address].    Your next appointment:   1 year(s)  The format for your next appointment:   Either In Person or Virtual  Provider:   Dr Caryl Comes   Other Instructions Remote monitoring is used to monitor your Pacemaker  from home. This monitoring reduces the number of office visits required to check your device to one time per year. It allows [place] to keep an eye on the functioning of your device to ensure it is working properly. You are scheduled for a device check from home on 09/25/19. You may send your transmission at any time that day. If you have a wireless device,  the transmission will be sent automatically. After your physician reviews your transmission, you will receive a postcard with your next transmission date.

## 2019-07-31 NOTE — Progress Notes (Signed)
Electrophysiology Office Note Date: 07/31/2019  ID:  AUNESTY Julie Clay, DOB September 19, 1940, MRN [id number]  PCP: Julie Rossetti, MD Primary Cardiologist: Kate Sable, MD Electrophysiologist: Dr. Caryl Comes  CC: Pacemaker follow-up  Julie Clay is a 79 y.o. female seen today for Dr. Caryl Comes . she presents today for routine electrophysiology followup.  Since last being seen in our clinic, the patient reports doing very well.  she denies chest pain, palpitations, dyspnea, PND, orthopnea, nausea, vomiting, dizziness, syncope, edema, weight gain, or early satiety.  Device History: StMudlogger PPM implanted 2019 for Mobitz II  Past Medical History:  Diagnosis Date  . Asthma    spirometry (2011)- mild ventilary defect  . Bone spur    "heels, knees, left shoulder" (06/22/2017)  . Breast cancer, left breast ([place]) dx'd 03/2011  . Chronic bronchitis ([place])   . COPD (chronic obstructive pulmonary disease) ([place])   . Fatty liver 2013   enlarged  . GERD (gastroesophageal reflux disease)   . HSV (herpes simplex virus) infection   . Hyperlipidemia   . Hypertension   . Lung nodule seen on imaging study 2013  . Lymphedema    Left arm  . 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 Osteoarthritis    "all over" (06/22/2017)  . Sleep apnea    Stop Bang score of 5. Pt has had sleep study, but was shown to be negative for sleep apnea.  . Type II diabetes mellitus ([place])   . Ventral hernia, unspecified, without mention of obstruction or gangrene    Past Surgical History:  Procedure Laterality Date  . BREAST BIOPSY Left 04/2011  . BREAST BIOPSY Left 06/22/13  . BREAST BIOPSY Left 09/12/2013   Procedure: BREAST BIOPSY WITH NEEDLE LOCALIZATION;  Surgeon: Jamesetta So, MD;  Location: [facility];  Service: General;  Laterality: Left;  . BREAST LUMPECTOMY Left 04/2011 X 2  . CATARACT EXTRACTION W/ INTRAOCULAR LENS  IMPLANT, BILATERAL Bilateral   . CATARACT EXTRACTION, BILATERAL     on different  occassions   . [place]  . DILATION AND CURETTAGE OF UTERUS  11/06/2013  . HAND LIGAMENT RECONSTRUCTION Right ~ 2010  . HYSTEROSCOPY WITH D & C N/A 11/06/2013   Procedure: DILATATION AND CURETTAGE /HYSTEROSCOPY;  Surgeon: Jonnie Kind, MD;  Location: [facility];  Service: Gynecology;  Laterality: N/A;  . INSERT / REPLACE / REMOVE PACEMAKER  06/22/2017  . LUNG LOBECTOMY Right 1988   Fungal Infection  . PACEMAKER IMPLANT N/A 06/22/2017   Procedure: PACEMAKER IMPLANT;  Surgeon: Deboraha Sprang, MD;  Location: [place] [facility];  Service: Cardiovascular;  Laterality: N/A;  . POLYPECTOMY N/A 11/06/2013   Procedure: POLYPECTOMY (REMOVAL ENDOMETRIAL POLYP);  Surgeon: Jonnie Kind, MD;  Location: [facility];  Service: Gynecology;  Laterality: N/A;  . RECTOCELE REPAIR N/A 11/06/2013   Procedure: POSTERIOR REPAIR (RECTOCELE);  Surgeon: Jonnie Kind, MD;  Location: [facility];  Service: Gynecology;  Laterality: N/A;  . SHOULDER ARTHROSCOPY WITH OPEN ROTATOR CUFF REPAIR Left 07/26/2012  . TONSILLECTOMY  1949  . TUBAL LIGATION      Current Outpatient Medications  Medication Sig Dispense Refill  . acetaminophen (TYLENOL) 500 MG tablet Take 500 mg by mouth as needed for moderate pain. Takes 2 when needed    . albuterol (PROVENTIL HFA;VENTOLIN HFA) 108 (90 Base) MCG/ACT inhaler Inhale 2 puffs into the lungs every 4 (four) hours as needed. 6.7 g  6  . Aloe Vera Leaf POWD Take 800 mg by mouth 2 (two) times daily.     . Ascorbic Acid (VITAMIN C) 1000 MG tablet Take 1,000 mg by mouth 2 (two) times daily.    Marland Kitchen b complex vitamins tablet Take 1 tablet by mouth 2 (two) times daily.     . Blood Glucose Monitoring Suppl (BLOOD GLUCOSE SYSTEM PAK) KIT Use as directed to monitor FSBS 1x daily. Dx: E11.9. Please dispense as Accu-Chek Aviva 1 each 1  . Cholecalciferol (VITAMIN D3) 2000 UNITS TABS Take 2,000 Units by mouth 2 (two) times daily.    . Echinacea 380 MG CAPS Take 1 capsule by mouth as needed  (cold/flue).    . fluocinonide cream (LIDEX) 4.23 % Apply 1 application topically 2 (two) times daily as needed (poison ivy/bites/hives). 30 g 11  . Ginkgo Biloba 120 MG CAPS Take 1 capsule by mouth daily.     Marland Kitchen glipiZIDE (GLUCOTROL) 10 MG tablet TAKE 1/2 (ONE-HALF) TABLET BY MOUTH TWICE DAILY BEFORE A MEAL FOR DIABETES 180 tablet 0  . Glucose Blood (BLOOD GLUCOSE TEST STRIPS) STRP Use as directed to monitor FSBS 1x daily. Dx: E11.9. Please dispense as Accu-Chek Aviva 100 each 1  . KRILL OIL PO Take 1 capsule by mouth 2 (two) times daily.    . Lancets MISC Use as directed to monitor FSBS 1x daily. Dx: E11.9. Please dispense as Accu-Chek Aviva 100 each 1  . LORazepam (ATIVAN) 0.5 MG tablet Take 1 tablet (0.5 mg total) by mouth 2 (two) times daily as needed for anxiety. 30 tablet 1  . Melatonin 3 MG TABS Take 3 mg by mouth at bedtime as needed and may repeat dose one time if needed (sleep).     . metFORMIN (GLUCOPHAGE) 1000 MG tablet TAKE 1 TABLET BY MOUTH TWICE DAILY WITH A MEAL FOR DIABETES 180 tablet 0  . metoprolol succinate (TOPROL-XL) 25 MG 24 hr tablet Take 0.5 tablets (12.5 mg total) by mouth daily. 45 tablet 3  . Multiple Vitamin (MULTIVITAMIN WITH MINERALS) TABS tablet Take 1 tablet by mouth daily.    . NONFORMULARY OR COMPOUNDED ITEM Apply 1 application topically 2 (two) times daily as needed. Reported on 06/10/2015    . rosuvastatin (CRESTOR) 5 MG tablet TAKE 1 TABLET BY MOUTH ONCE DAILY FOR CHOLESTEROL 90 tablet 0  . simethicone (MYLICON) 80 MG chewable tablet Chew 80 mg by mouth as needed for flatulence.     Marland Kitchen tiZANidine (ZANAFLEX) 4 MG tablet TAKE 1/2 (ONE-HALF) TABLET BY MOUTH EVERY 6 HOURS AS NEEDED FOR MUSCLE SPASM 30 tablet 0  . triamcinolone cream (KENALOG) 0.1 % Apply 1 application topically 2 (two) times daily. (Patient taking differently: Apply 1 application topically as needed. ) 30 g 0  . TRULICITY 5.36 RW/4.3XV SOPN INJECT 1 SYRINGE SUBCUTANEOUSLY ONCE A WEEK 4 mL 2   No  current facility-administered medications for this visit.    Allergies:   Arimidex [anastrozole], Adhesive [tape], Lisinopril, Tetanus toxoids, and Tetracyclines & related   Social History: Social History   Socioeconomic History  . Marital status: Married    Spouse name: Not on file  . Number of children: Not on file  . Years of education: Not on file  . Highest education level: Not on file  Occupational History  . 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 Smoking status: Never Smoker  . Smokeless tobacco: Never Used  Substance and  Sexual Activity  . Alcohol use: Yes    Alcohol/week: 0.0 standard drinks    Comment: 06/22/2017 "might have a drink twice a year"  . Drug use: No  . Sexual activity: Not Currently    Birth control/protection: Post-menopausal  Other Topics Concern  . Not on file  Social History Narrative  . Not on file   Social Determinants of Health   Financial Resource Strain:   . Difficulty of Paying Living Expenses:   Food Insecurity:   . Worried About Charity fundraiser in the Last Year:   . Arboriculturist in the Last Year:   Transportation Needs:   . Film/video editor (Medical):   Marland Kitchen Lack of Transportation (Non-Medical):   Physical Activity:   . Days of Exercise per Week:   . Minutes of Exercise per Session:   Stress:   . Feeling of Stress :   Social Connections:   . Frequency of Communication with Friends and Family:   . Frequency of Social Gatherings with Friends and Family:   . Attends Religious Services:   . Active Member of Clubs or Organizations:   . Attends Archivist Meetings:   Marland Kitchen Marital Status:   Intimate Partner Violence:   . Fear of Current or Ex-Partner:   . Emotionally Abused:   Marland Kitchen Physically Abused:   . Sexually Abused:     Family History: Family History  Problem Relation Age of Onset  . Hypertension Mother   . Heart disease Mother   . Diabetes Mother   . Kidney  disease Mother        ESRD  . Cancer Mother        Bladder  . Hypertension Father   . Hyperlipidemia Father   . Heart disease Father   . Diabetes Father   . Cancer Father        Stomach Cancer  . Diabetes Sister   . Heart disease Sister   . Alzheimer's disease Paternal Aunt   . Stroke Maternal Grandfather   . Cancer Paternal Grandfather        stomach  . Cancer Maternal Uncle        prostate  . Cancer Paternal Uncle        stomach     Review of Systems: All other systems reviewed and are otherwise negative except as noted above.  Physical Exam: Vitals:   07/31/19 1240  BP: 136/80  Pulse: 78  SpO2: 98%  Weight: 225 lb (102.1 kg)  Height: _0  (1.651 m)     GEN- The patient is well appearing, alert and oriented x 3 today.   HEENT: normocephalic, atraumatic; sclera clear, conjunctiva pink; hearing intact; oropharynx clear; neck supple  Lungs- Clear to ausculation bilaterally, normal work of breathing.  No wheezes, rales, rhonchi Heart- Regular rate and rhythm, no murmurs, rubs or gallops  GI- soft, non-tender, non-distended, bowel sounds present  Extremities- no clubbing, cyanosis, or edema  MS- no significant deformity or atrophy Skin- warm and dry, no rash or lesion; PPM pocket well healed Psych- euthymic mood, full affect Neuro- strength and sensation are intact  PPM Interrogation- reviewed in detail today,  See PACEART report  EKG:  EKG is ordered today.A sensed V paced at 78 bpm  Recent Labs: 07/23/2019: ALT 14; BUN 21; Creat 0.85; Hemoglobin 13.9; Magnesium 1.7; Platelets 243; Potassium 3.8; Sodium 145; TSH 3.12   Wt Readings from Last 3 Encounters:  07/31/19 225 lb (102.1 kg)  07/24/19 225 lb (102.1 kg)  07/23/19 223 lb (101.2 kg)     Other studies Reviewed: Additional studies/ records that were reviewed today include: Echo 05/2017 shows LVEF 60-65%, Previous EP office notes, Previous remote checks, Most recent labwork.   Assessment and Plan:  1.  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 Sinus tachycardia Continue low dose metoprolol  3. Chronic diastolic CHF Stable. Recently taken off torsemide   Current medicines are reviewed at length with the patient today.   The patient does not have concerns regarding her medicines.  The following changes were made today:  none  Labs/ tests ordered today include:  Orders Placed This Encounter  Procedures  . CUP PACEART [place]  . EKG 12-Lead    Disposition:   Follow up with Dr. Caryl Comes in 1 year    Signed, Annamaria Helling  07/31/2019 2:02 PM  [facility] [address] [place] [address] [phone] (office) [phone] (fax)

## 2019-08-03 ENCOUNTER — Other Ambulatory Visit: Payer: Self-pay | Admitting: Family Medicine

## 2019-08-03 NOTE — Telephone Encounter (Signed)
Ok to refill 

## 2019-08-28 ENCOUNTER — Emergency Department (HOSPITAL_COMMUNITY): Payer: Medicare Other

## 2019-08-28 ENCOUNTER — Other Ambulatory Visit: Payer: Self-pay

## 2019-08-28 ENCOUNTER — Encounter (HOSPITAL_COMMUNITY): Payer: Self-pay | Admitting: Emergency Medicine

## 2019-08-28 ENCOUNTER — Emergency Department (HOSPITAL_COMMUNITY)
Admission: EM | Admit: 2019-08-28 | Discharge: 2019-08-28 | Disposition: A | Discharge status: Home / Self Care | Payer: Medicare Other | Attending: Emergency Medicine | Admitting: Emergency Medicine

## 2019-08-28 DIAGNOSIS — M25532 Pain in left wrist: Secondary | ICD-10-CM | POA: Diagnosis not present

## 2019-08-28 DIAGNOSIS — X500XXA Overexertion from strenuous movement or load, initial encounter: Secondary | ICD-10-CM | POA: Diagnosis not present

## 2019-08-28 DIAGNOSIS — Z7984 Long term (current) use of oral hypoglycemic drugs: Secondary | ICD-10-CM | POA: Diagnosis not present

## 2019-08-28 DIAGNOSIS — N182 Chronic kidney disease, stage 2 (mild): Secondary | ICD-10-CM | POA: Diagnosis not present

## 2019-08-28 DIAGNOSIS — J449 Chronic obstructive pulmonary disease, unspecified: Secondary | ICD-10-CM | POA: Diagnosis not present

## 2019-08-28 DIAGNOSIS — Z79899 Other long term (current) drug therapy: Secondary | ICD-10-CM | POA: Insufficient documentation

## 2019-08-28 DIAGNOSIS — I129 Hypertensive chronic kidney disease with stage 1 through stage 4 chronic kidney disease, or unspecified chronic kidney disease: Secondary | ICD-10-CM | POA: Diagnosis not present

## 2019-08-28 DIAGNOSIS — M19032 Primary osteoarthritis, left wrist: Secondary | ICD-10-CM | POA: Diagnosis not present

## 2019-08-28 DIAGNOSIS — Z950 Presence of cardiac pacemaker: Secondary | ICD-10-CM | POA: Insufficient documentation

## 2019-08-28 DIAGNOSIS — J45909 Unspecified asthma, uncomplicated: Secondary | ICD-10-CM | POA: Diagnosis not present

## 2019-08-28 DIAGNOSIS — Z853 Personal history of malignant neoplasm of breast: Secondary | ICD-10-CM | POA: Insufficient documentation

## 2019-08-28 DIAGNOSIS — E1122 Type 2 diabetes mellitus with diabetic chronic kidney disease: Secondary | ICD-10-CM | POA: Diagnosis not present

## 2019-08-28 MED ORDER — ACETAMINOPHEN 325 MG PO TABS
650.0000 mg | ORAL_TABLET | Freq: Once | ORAL | Status: AC
  Administered 2019-08-28: 650 mg via ORAL
  Filled 2019-08-28: qty 2

## 2019-08-28 NOTE — Discharge Instructions (Addendum)
You were seen today for left wrist pain.  This is likely related to your known arthritis.  Keep iced and elevated.  You may use your splint as needed for comfort.  Take Tylenol for pain.

## 2019-08-28 NOTE — ED Triage Notes (Signed)
Patient states left hand and wrist pain that started earlier yesterday after patient was lifting a floor mat in a basket. Patient states that her left hand and wrist starting aching later and that she took two Tylenol and used ice but now the pain is radiating from her left hand and wrist up her arm. Patient placed her husband's hand splint on and states that it has helped some but the area is aching.

## 2019-08-28 NOTE — ED Provider Notes (Signed)
Left wrist [facility] EMERGENCY DEPARTMENT Provider Note   CSN: [id number] Arrival date & time: 08/28/19  0344     History Chief Complaint  Patient presents with  . Hand Injury    left    Julie Clay is a 79 y.o. female.  HPI     This is a 79 year old female with a history of asthma, COPD, hypertension, hyperlipidemia who presents pain.  Patient reports that she lifted something heavy yesterday and feels she may have injured her left wrist.  She reports pain over the medial aspect of the wrist.  It is worse with range of motion.  She took Tylenol with some relief.  She also got some relief by immobilizing her wrist with her husband's wrist splint.  Did not fall.  No significant injury.  She currently rates her pain at 7 out of 10.  She is right-handed.  Past Medical History:  Diagnosis Date  . Asthma    spirometry (2011)- mild ventilary defect  . Bone spur    "heels, knees, left shoulder" (06/22/2017)  . Breast cancer, left breast ([place]) dx'd 03/2011  . Chronic bronchitis ([place])   . COPD (chronic obstructive pulmonary disease) ([place])   . Fatty liver 2013   enlarged  . GERD (gastroesophageal reflux disease)   . HSV (herpes simplex virus) infection   . Hyperlipidemia   . Hypertension   . Lung nodule seen on imaging study 2013  . Lymphedema    Left arm  . 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 Osteoarthritis    "all over" (06/22/2017)  . Sleep apnea    Stop Bang score of 5. Pt has had sleep study, but was shown to be negative for sleep apnea.  . Type II diabetes mellitus ([employer])   . Ventral hernia, unspecified, without mention of obstruction or gangrene     Patient Active Problem List   Diagnosis Date Noted  . Pacemaker 07/13/2017  . 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/2017  . 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 Heart murmur 05/31/2016  . Primary osteoarthritis of first carpometacarpal joint of left hand 06/12/2015  . Fatty liver disease, nonalcoholic  23/76/2831  . Lymphedema 02/18/2015  . Leg cramps 08/02/2014  . Ganglion cyst of flexor tendon sheath of finger of left hand 08/02/2014  . Skin irritation 01/19/2014  . Post-operative state 11/14/2013  . Atypical ductal hyperplasia of left breast 07/12/2013  . Endometrial polyp 06/25/2013  . Rectocele 06/25/2013  . Diabetic neuropathy ([place]) 05/09/2013  . Pre-ulcerative calluses 05/09/2013  . Bladder prolapse, female, acquired 05/09/2013  . Pain, joint, multiple sites 03/26/2013  . 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 Insomnia 11/29/2012  . Hip pain 09/08/2012  . Rotator cuff syndrome of left shoulder 06/21/2012  . Peripheral edema 02/04/2012  . HSV (herpes simplex virus) infection 12/09/2011  . Dyspnea 11/15/2011  . 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 Essential hypertension, benign 10/11/2011  . DM (diabetes mellitus) ([place]) 10/11/2011  . OA (osteoarthritis) 10/11/2011  . Morbidly obese ([place]) 10/11/2011  . Asthma 10/11/2011  . Hyperlipidemia 10/11/2011  . Chronic fatigue 10/11/2011  . OSA (obstructive sleep apnea) 10/11/2011    Past Surgical History:  Procedure Laterality Date  . BREAST BIOPSY Left 04/2011  . BREAST BIOPSY Left 06/22/13  . BREAST BIOPSY Left 09/12/2013   Procedure: BREAST BIOPSY WITH NEEDLE LOCALIZATION;  Surgeon: Jamesetta So, MD;  Location: [facility];  Service: General;  Laterality: Left;  . BREAST LUMPECTOMY Left 04/2011 X 2  . CATARACT EXTRACTION W/ INTRAOCULAR LENS  IMPLANT, BILATERAL Bilateral   . CATARACT EXTRACTION, BILATERAL     on different occassions   . [place]  . DILATION AND CURETTAGE OF UTERUS  11/06/2013  . HAND LIGAMENT RECONSTRUCTION Right ~ 2010  . HYSTEROSCOPY WITH D & C N/A 11/06/2013   Procedure: DILATATION AND CURETTAGE /HYSTEROSCOPY;  Surgeon: Jonnie Kind, MD;  Location: [facility];  Service: Gynecology;  Laterality: N/A;  . INSERT / REPLACE / REMOVE PACEMAKER   06/22/2017  . LUNG LOBECTOMY Right 1988   Fungal Infection  . PACEMAKER IMPLANT N/A 06/22/2017   Procedure: PACEMAKER IMPLANT;  Surgeon: Deboraha Sprang, MD;  Location: [place] [facility];  Service: Cardiovascular;  Laterality: N/A;  . POLYPECTOMY N/A 11/06/2013   Procedure: POLYPECTOMY (REMOVAL ENDOMETRIAL POLYP);  Surgeon: Jonnie Kind, MD;  Location: [facility];  Service: Gynecology;  Laterality: N/A;  . RECTOCELE REPAIR N/A 11/06/2013   Procedure: POSTERIOR REPAIR (RECTOCELE);  Surgeon: Jonnie Kind, MD;  Location: [facility];  Service: Gynecology;  Laterality: N/A;  . SHOULDER ARTHROSCOPY WITH OPEN ROTATOR CUFF REPAIR Left 07/26/2012  . TONSILLECTOMY  1949  . TUBAL LIGATION       OB History    Gravida  2   Para  2   Term      Preterm      AB      Living  2     SAB      TAB      Ectopic      Multiple      Live Births  2           Family History  Problem Relation Age of Onset  . Hypertension Mother   . Heart disease Mother   . Diabetes Mother   . Kidney disease Mother        ESRD  . Cancer Mother        Bladder  . Hypertension Father   . Hyperlipidemia Father   . Heart disease Father   . Diabetes Father   . Cancer Father        Stomach Cancer  . Diabetes Sister   . Heart disease Sister   . Alzheimer's disease Paternal Aunt   . Stroke Maternal Grandfather   . Cancer Paternal Grandfather        stomach  . Cancer Maternal Uncle        prostate  . Cancer Paternal Uncle        stomach    Social History   Tobacco Use  . Smoking status: Never Smoker  . Smokeless tobacco: Never Used  Substance Use Topics  . Alcohol use: Yes    Alcohol/week: 0.0 standard drinks    Comment: 06/22/2017 "might have a drink twice a year"  . Drug use: No    Home Medications Prior to Admission medications   Medication Sig Start Date End Date Taking? Authorizing Provider  tiZANidine (ZANAFLEX) 4 MG tablet TAKE 1/2 (ONE-HALF) TABLET BY MOUTH EVERY 6 HOURS AS NEEDED FOR  MUSCLE SPASM 08/03/19   Alycia Rossetti, MD  acetaminophen (TYLENOL) 500 MG tablet Take 500 mg by mouth as needed for moderate pain. Takes 2 when needed    [provider]  albuterol (PROVENTIL HFA;VENTOLIN HFA) 108 (90 Base) MCG/ACT inhaler Inhale 2 puffs into the lungs every 4 (four)  hours as needed. 06/02/16   Alycia Rossetti, MD  Aloe Vera Leaf POWD Take 800 mg by mouth 2 (two) times daily.     [provider]  Ascorbic Acid (VITAMIN C) 1000 MG tablet Take 1,000 mg by mouth 2 (two) times daily.    [provider]  b complex vitamins tablet Take 1 tablet by mouth 2 (two) times daily.     [provider]  Blood Glucose Monitoring Suppl (BLOOD GLUCOSE SYSTEM PAK) KIT Use as directed to monitor FSBS 1x daily. Dx: E11.9. Please dispense as Accu-Chek Aviva 01/18/18   [place], Modena Nunnery, MD  Cholecalciferol (VITAMIN D3) 2000 UNITS TABS Take 2,000 Units by mouth 2 (two) times daily.    [provider]  Echinacea 380 MG CAPS Take 1 capsule by mouth as needed (cold/flue).    [provider]  fluocinonide cream (LIDEX) 1.10 % Apply 1 application topically 2 (two) times daily as needed (poison ivy/bites/hives). 08/14/18   Alycia Rossetti, MD  Ginkgo Biloba 120 MG CAPS Take 1 capsule by mouth daily.     [provider]  glipiZIDE (GLUCOTROL) 10 MG tablet TAKE 1/2 (ONE-HALF) TABLET BY MOUTH TWICE DAILY BEFORE A MEAL FOR DIABETES 03/19/19   Alycia Rossetti, MD  Glucose Blood (BLOOD GLUCOSE TEST STRIPS) STRP Use as directed to monitor FSBS 1x daily. Dx: E11.9. Please dispense as Accu-Chek Aviva 01/18/18   [place], Modena Nunnery, MD  KRILL OIL PO Take 1 capsule by mouth 2 (two) times daily.    [provider]  Lancets MISC Use as directed to monitor FSBS 1x daily. Dx: E11.9. Please dispense as Accu-Chek Aviva 01/18/18   [place], Modena Nunnery, MD  LORazepam (ATIVAN) 0.5 MG tablet Take 1 tablet (0.5 mg total) by mouth 2 (two) times daily as needed for  anxiety. 10/18/18   Alycia Rossetti, MD  Melatonin 3 MG TABS Take 3 mg by mouth at bedtime as needed and may repeat dose one time if needed (sleep).     [provider]  metFORMIN (GLUCOPHAGE) 1000 MG tablet TAKE 1 TABLET BY MOUTH TWICE DAILY WITH A MEAL FOR DIABETES 05/28/19   Alycia Rossetti, MD  metoprolol succinate (TOPROL-XL) 25 MG 24 hr tablet Take 0.5 tablets (12.5 mg total) by mouth daily. 01/23/19   Herminio Commons, MD  Multiple Vitamin (MULTIVITAMIN WITH MINERALS) TABS tablet Take 1 tablet by mouth daily.    [provider]  NONFORMULARY OR COMPOUNDED ITEM Apply 1 application topically 2 (two) times daily as needed. Reported on 06/10/2015    [provider]  rosuvastatin (CRESTOR) 5 MG tablet TAKE 1 TABLET BY MOUTH ONCE DAILY FOR CHOLESTEROL 07/02/19   [place], Modena Nunnery, MD  simethicone (MYLICON) 80 MG chewable tablet Chew 80 mg by mouth as needed for flatulence.     [provider]  triamcinolone cream (KENALOG) 0.1 % Apply 1 application topically 2 (two) times daily. Patient taking differently: Apply 1 application topically as needed.  12/29/17   Alycia Rossetti, MD  TRULICITY 3.15 XY/5.8PF SOPN INJECT 1 SYRINGE SUBCUTANEOUSLY ONCE A WEEK 06/25/19   Alycia Rossetti, MD    Allergies    Arimidex [anastrozole], Adhesive [tape], Lisinopril, Tetanus toxoids, and Tetracyclines & related  Review of Systems   Review of Systems  Musculoskeletal:       Left wrist pain  Skin: Negative for color change and wound.  Neurological: Negative for weakness and numbness.  All other systems reviewed and  are negative.   Physical Exam Updated Vital Signs BP 122/68 (BP Location: Left Arm)   Pulse 73   Temp 97.8 F (36.6 C) (Oral)   Resp 20   Ht 1.651 m (5' 5" )   Wt 99.8 kg   SpO2 97%   BMI 36.61 kg/m   Physical Exam Vitals and nursing note reviewed.  Constitutional:      Appearance: She is well-developed. She is obese. She is not ill-appearing.    HENT:     Head: Normocephalic and atraumatic.     Nose: Nose normal.     Mouth/Throat:     Mouth: Mucous membranes are moist.  Cardiovascular:     Rate and Rhythm: Normal rate and regular rhythm.  Pulmonary:     Effort: Pulmonary effort is normal. No respiratory distress.  Musculoskeletal:     Cervical back: Neck supple.     Comments: No swelling or deformity noted of the left wrist, there is tenderness palpation over the medial aspect of the wrist, no overlying skin changes, 2+ radial pulse, neurovascular intact distally with flexion and extension of all 5 digits and the wrist intact  Skin:    General: Skin is warm and dry.  Neurological:     Mental Status: She is alert and oriented to person, place, and time.  Psychiatric:        Mood and Affect: Mood normal.     ED Results / Procedures / Treatments   Labs (all labs ordered are listed, but only abnormal results are displayed) Labs Reviewed - No data to display  EKG None  Radiology DG Wrist Complete Left  Result Date: 08/28/2019 CLINICAL DATA:  Ulnar-sided wrist pain since yesterday. EXAM: LEFT WRIST - COMPLETE 3+ VIEW COMPARISON:  Left hand x-rays dated July 06, 2014. FINDINGS: No acute fracture or dislocation. Mild negative ulnar variance again noted. Unchanged mild distal radioulnar joint and scaphotrapeziotrapezoid joint osteoarthritis. Unchanged moderate first CMC joint osteoarthritis. Bone mineralization is normal. Soft tissues are unremarkable. IMPRESSION: 1. Unchanged left wrist osteoarthritis as described above. No acute osseous abnormality. Electronically Signed   By: Titus Dubin M.D.   On: 08/28/2019 05:03    Procedures Procedures (including critical care time)  Medications Ordered in ED Medications  acetaminophen (TYLENOL) tablet 650 mg (650 mg Oral Given 08/28/19 0439)    ED Course  I have reviewed the triage vital signs and the nursing notes.  Pertinent labs & imaging results that were available  during my care of the patient were reviewed by me and considered in my medical decision making (see chart for details).    MDM Rules/Calculators/A&P                       Patient presents with left wrist pain.  Overall nontoxic and vital signs are reassuring.  No significant injury but feels pain started after lifting something heavy.  Suspect strain.  X-rays obtained to rule out fracture.  No evidence of fracture but does show evidence of osteoarthritis.  Patient given Tylenol.  Recommend ice, Tylenol for symptom control.  She may wear splint as needed for comfort.  After history, exam, and medical workup I feel the patient has been appropriately medically screened and is safe for discharge home. Pertinent diagnoses were discussed with the patient. Patient was given return precautions.   Final Clinical Impression(s) / ED Diagnoses Final diagnoses:  Arthritis of left wrist    Rx / DC Orders ED Discharge Orders  None       Merryl Hacker, MD 08/28/19 [phone]

## 2019-09-04 ENCOUNTER — Other Ambulatory Visit: Payer: Self-pay | Admitting: Family Medicine

## 2019-09-10 ENCOUNTER — Other Ambulatory Visit: Payer: Self-pay | Admitting: Family Medicine

## 2019-09-21 ENCOUNTER — Other Ambulatory Visit: Payer: Self-pay | Admitting: Family Medicine

## 2019-09-25 ENCOUNTER — Ambulatory Visit (INDEPENDENT_AMBULATORY_CARE_PROVIDER_SITE_OTHER): Payer: Medicare Other | Admitting: *Deleted

## 2019-09-25 DIAGNOSIS — I441 Atrioventricular block, second degree: Secondary | ICD-10-CM

## 2019-09-25 LAB — CUP PACEART REMOTE DEVICE CHECK
Battery Remaining Longevity: 104 mo
Battery Remaining Percentage: 95.5 %
Battery Voltage: 2.99 V
Brady Statistic AP VP Percent: 3.9 %
Brady Statistic AP VS Percent: 1 %
Brady Statistic AS VP Percent: 96 %
Brady Statistic AS VS Percent: 1 %
Brady Statistic RA Percent Paced: 3.7 %
Brady Statistic RV Percent Paced: 99 %
Date Time Interrogation Session: 20210608020013
Implantable Lead Implant Date: 20190306
Implantable Lead Implant Date: 20190306
Implantable Lead Location: 753859
Implantable Lead Location: 753860
Implantable Lead Model: 5076
Implantable Lead Model: 5076
Implantable Pulse Generator Implant Date: 20190306
Lead Channel Impedance Value: 360 Ohm
Lead Channel Impedance Value: 450 Ohm
Lead Channel Pacing Threshold Amplitude: 0.5 V
Lead Channel Pacing Threshold Amplitude: 0.75 V
Lead Channel Pacing Threshold Pulse Width: 0.5 ms
Lead Channel Pacing Threshold Pulse Width: 0.5 ms
Lead Channel Sensing Intrinsic Amplitude: 3.7 mV
Lead Channel Sensing Intrinsic Amplitude: 5.6 mV
Lead Channel Setting Pacing Amplitude: 2 V
Lead Channel Setting Pacing Amplitude: 2.5 V
Lead Channel Setting Pacing Pulse Width: 0.5 ms
Lead Channel Setting Sensing Sensitivity: 2 mV
Pulse Gen Model: 2272
Pulse Gen Serial Number: 9001027

## 2019-09-26 NOTE — Progress Notes (Signed)
Remote pacemaker transmission.   

## 2019-09-30 ENCOUNTER — Other Ambulatory Visit: Payer: Self-pay | Admitting: Family Medicine

## 2019-10-11 ENCOUNTER — Other Ambulatory Visit (HOSPITAL_COMMUNITY): Payer: Self-pay | Admitting: *Deleted

## 2019-10-11 DIAGNOSIS — C50912 Malignant neoplasm of unspecified site of left female breast: Secondary | ICD-10-CM

## 2019-10-12 ENCOUNTER — Other Ambulatory Visit: Payer: Self-pay

## 2019-10-12 ENCOUNTER — Inpatient Hospital Stay (HOSPITAL_COMMUNITY): Payer: Medicare Other | Attending: Hematology

## 2019-10-12 ENCOUNTER — Ambulatory Visit (HOSPITAL_COMMUNITY)
Admission: RE | Admit: 2019-10-12 | Discharge: 2019-10-12 | Disposition: A | Discharge status: Home / Self Care | Payer: Medicare Other | Source: Ambulatory Visit | Attending: Hematology | Admitting: Hematology

## 2019-10-12 DIAGNOSIS — C50912 Malignant neoplasm of unspecified site of left female breast: Secondary | ICD-10-CM | POA: Diagnosis present

## 2019-10-12 DIAGNOSIS — Z1231 Encounter for screening mammogram for malignant neoplasm of breast: Secondary | ICD-10-CM | POA: Insufficient documentation

## 2019-10-12 LAB — CBC WITH DIFFERENTIAL/PLATELET
Abs Immature Granulocytes: 0.06 10*3/uL (ref 0.00–0.07)
Basophils Absolute: 0 10*3/uL (ref 0.0–0.1)
Basophils Relative: 0 %
Eosinophils Absolute: 0.3 10*3/uL (ref 0.0–0.5)
Eosinophils Relative: 3 %
HCT: 43 % (ref 36.0–46.0)
Hemoglobin: 13.6 g/dL (ref 12.0–15.0)
Immature Granulocytes: 1 %
Lymphocytes Relative: 29 %
Lymphs Abs: 2.9 10*3/uL (ref 0.7–4.0)
MCH: 27.1 pg (ref 26.0–34.0)
MCHC: 31.6 g/dL (ref 30.0–36.0)
MCV: 85.8 fL (ref 80.0–100.0)
Monocytes Absolute: 0.7 10*3/uL (ref 0.1–1.0)
Monocytes Relative: 7 %
Neutro Abs: 6.1 10*3/uL (ref 1.7–7.7)
Neutrophils Relative %: 60 %
Platelets: 264 10*3/uL (ref 150–400)
RBC: 5.01 MIL/uL (ref 3.87–5.11)
RDW: 13.9 % (ref 11.5–15.5)
WBC: 10 10*3/uL (ref 4.0–10.5)
nRBC: 0 % (ref 0.0–0.2)

## 2019-10-12 LAB — COMPREHENSIVE METABOLIC PANEL
ALT: 18 U/L (ref 0–44)
AST: 22 U/L (ref 15–41)
Albumin: 3.9 g/dL (ref 3.5–5.0)
Alkaline Phosphatase: 51 U/L (ref 38–126)
Anion gap: 11 (ref 5–15)
BUN: 18 mg/dL (ref 8–23)
CO2: 23 mmol/L (ref 22–32)
Calcium: 9.2 mg/dL (ref 8.9–10.3)
Chloride: 104 mmol/L (ref 98–111)
Creatinine, Ser: 0.76 mg/dL (ref 0.44–1.00)
GFR calc Af Amer: >60 mL/min (ref >60)
GFR calc non Af Amer: >60 mL/min (ref >60)
Glucose, Bld: 186 mg/dL — ABNORMAL HIGH (ref 70–99)
Potassium: 4.2 mmol/L (ref 3.5–5.1)
Sodium: 138 mmol/L (ref 135–145)
Total Bilirubin: 0.6 mg/dL (ref 0.3–1.2)
Total Protein: 7.1 g/dL (ref 6.5–8.1)

## 2019-10-17 ENCOUNTER — Ambulatory Visit (HOSPITAL_COMMUNITY): Payer: Medicare Other | Admitting: Nurse Practitioner

## 2019-10-17 ENCOUNTER — Other Ambulatory Visit (HOSPITAL_COMMUNITY): Payer: Self-pay | Admitting: Hematology

## 2019-10-17 DIAGNOSIS — R928 Other abnormal and inconclusive findings on diagnostic imaging of breast: Secondary | ICD-10-CM

## 2019-10-19 ENCOUNTER — Ambulatory Visit (HOSPITAL_COMMUNITY): Payer: Medicare Other | Admitting: Nurse Practitioner

## 2019-11-06 ENCOUNTER — Ambulatory Visit (HOSPITAL_COMMUNITY)
Admission: RE | Admit: 2019-11-06 | Discharge: 2019-11-06 | Disposition: A | Discharge status: Home / Self Care | Payer: Medicare Other | Source: Ambulatory Visit | Attending: Hematology | Admitting: Hematology

## 2019-11-06 ENCOUNTER — Other Ambulatory Visit: Payer: Self-pay

## 2019-11-06 DIAGNOSIS — N6489 Other specified disorders of breast: Secondary | ICD-10-CM | POA: Diagnosis not present

## 2019-11-06 DIAGNOSIS — R928 Other abnormal and inconclusive findings on diagnostic imaging of breast: Secondary | ICD-10-CM | POA: Diagnosis not present

## 2019-11-06 DIAGNOSIS — Z853 Personal history of malignant neoplasm of breast: Secondary | ICD-10-CM | POA: Diagnosis not present

## 2019-11-07 ENCOUNTER — Other Ambulatory Visit (HOSPITAL_COMMUNITY): Payer: Self-pay | Admitting: Hematology

## 2019-11-07 ENCOUNTER — Inpatient Hospital Stay (HOSPITAL_COMMUNITY): Payer: Medicare Other | Attending: Hematology | Admitting: Nurse Practitioner

## 2019-11-07 DIAGNOSIS — I10 Essential (primary) hypertension: Secondary | ICD-10-CM | POA: Diagnosis not present

## 2019-11-07 DIAGNOSIS — K5900 Constipation, unspecified: Secondary | ICD-10-CM | POA: Insufficient documentation

## 2019-11-07 DIAGNOSIS — K760 Fatty (change of) liver, not elsewhere classified: Secondary | ICD-10-CM | POA: Insufficient documentation

## 2019-11-07 DIAGNOSIS — G479 Sleep disorder, unspecified: Secondary | ICD-10-CM | POA: Diagnosis not present

## 2019-11-07 DIAGNOSIS — R0602 Shortness of breath: Secondary | ICD-10-CM | POA: Diagnosis not present

## 2019-11-07 DIAGNOSIS — Z800 Family history of malignant neoplasm of digestive organs: Secondary | ICD-10-CM | POA: Insufficient documentation

## 2019-11-07 DIAGNOSIS — Z888 Allergy status to other drugs, medicaments and biological substances status: Secondary | ICD-10-CM | POA: Insufficient documentation

## 2019-11-07 DIAGNOSIS — Z79899 Other long term (current) drug therapy: Secondary | ICD-10-CM | POA: Insufficient documentation

## 2019-11-07 DIAGNOSIS — Z8042 Family history of malignant neoplasm of prostate: Secondary | ICD-10-CM | POA: Insufficient documentation

## 2019-11-07 DIAGNOSIS — Z170 Estrogen receptor positive status [ER+]: Secondary | ICD-10-CM | POA: Insufficient documentation

## 2019-11-07 DIAGNOSIS — M2550 Pain in unspecified joint: Secondary | ICD-10-CM | POA: Diagnosis not present

## 2019-11-07 DIAGNOSIS — Z833 Family history of diabetes mellitus: Secondary | ICD-10-CM | POA: Diagnosis not present

## 2019-11-07 DIAGNOSIS — Z8349 Family history of other endocrine, nutritional and metabolic diseases: Secondary | ICD-10-CM | POA: Insufficient documentation

## 2019-11-07 DIAGNOSIS — Z8249 Family history of ischemic heart disease and other diseases of the circulatory system: Secondary | ICD-10-CM | POA: Diagnosis not present

## 2019-11-07 DIAGNOSIS — Z841 Family history of disorders of kidney and ureter: Secondary | ICD-10-CM | POA: Diagnosis not present

## 2019-11-07 DIAGNOSIS — Z7984 Long term (current) use of oral hypoglycemic drugs: Secondary | ICD-10-CM | POA: Insufficient documentation

## 2019-11-07 DIAGNOSIS — E785 Hyperlipidemia, unspecified: Secondary | ICD-10-CM | POA: Insufficient documentation

## 2019-11-07 DIAGNOSIS — C50912 Malignant neoplasm of unspecified site of left female breast: Secondary | ICD-10-CM

## 2019-11-07 DIAGNOSIS — R197 Diarrhea, unspecified: Secondary | ICD-10-CM | POA: Insufficient documentation

## 2019-11-07 DIAGNOSIS — M791 Myalgia, unspecified site: Secondary | ICD-10-CM | POA: Insufficient documentation

## 2019-11-07 DIAGNOSIS — Z8052 Family history of malignant neoplasm of bladder: Secondary | ICD-10-CM | POA: Insufficient documentation

## 2019-11-07 DIAGNOSIS — N63 Unspecified lump in unspecified breast: Secondary | ICD-10-CM

## 2019-11-07 DIAGNOSIS — E119 Type 2 diabetes mellitus without complications: Secondary | ICD-10-CM | POA: Diagnosis not present

## 2019-11-07 DIAGNOSIS — Z818 Family history of other mental and behavioral disorders: Secondary | ICD-10-CM | POA: Insufficient documentation

## 2019-11-07 DIAGNOSIS — R928 Other abnormal and inconclusive findings on diagnostic imaging of breast: Secondary | ICD-10-CM

## 2019-11-07 NOTE — Assessment & Plan Note (Addendum)
1.  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 -Oncotype DX score of 0, declined XRT. -Anastrozole from March 2013 through December 2013, discontinued secondary to arthralgias and myalgias. -Took tamoxifen for 4 years from January 2014 through January 2018. -Left breast ADH and ALH, status post lumpectomy on 09/12/2013. -Physical examination today reveals thickened area in the medial aspect of the right breast.  This is been stable with no other palpable masses.  No palpable adenopathy. -Screening mammogram done on 10/12/2019 showed BI-RADS Category 0 incomplete -Diagnostic mammogram done on 11/06/2019 showed BI-RADS Category 4 suspicious showed a 0.8 cm mass. -Patient refused to set up biopsy due to wanting to discuss it with [place]. -We will set up her biopsy and she will see [place] back in 3 weeks.

## 2019-11-07 NOTE — Progress Notes (Signed)
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 female returns for routine follow-up for breast cancer.  Patient reports she feels fine and has no problems with her energy levels.  She does report she is occasionally short of breath with activity.  And she also occasionally has constipation alternating with diarrhea.  She denies any mass palpable. Denies any nausea or vomiting. Denies any new pains. Had not noticed any recent bleeding such as epistaxis, hematuria or hematochezia. Denies recent chest pain on exertion, shortness of breath on minimal exertion, pre-syncopal episodes, or palpitations. Denies any numbness or tingling in hands or feet. Denies any recent fevers, infections, or recent hospitalizations. Patient reports appetite at 100% and energy level at 25%.  She is eating well maintain her weight at this time.    REVIEW OF SYSTEMS:  Review of Systems  Respiratory: Positive for shortness of breath.   Gastrointestinal: Positive for constipation and diarrhea.  Psychiatric/Behavioral: Positive for sleep disturbance.  All other systems reviewed and are negative.    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 Pt has had sleep study, but was shown to be negative for sleep apnea.   Type II diabetes mellitus (HCC)    Ventral hernia, unspecified, without mention of obstruction or gangrene    Past Surgical History:  Procedure Laterality Date   BREAST BIOPSY Left 04/2011   BREAST BIOPSY Left 06/22/13   BREAST BIOPSY Left 09/12/2013   Procedure: BREAST BIOPSY WITH NEEDLE LOCALIZATION;  Surgeon: Jamesetta So, MD;  Location: [facility];  Service: General;  Laterality: Left;   BREAST LUMPECTOMY Left 04/2011 X 2   CATARACT EXTRACTION W/ INTRAOCULAR LENS  IMPLANT, BILATERAL Bilateral    CATARACT EXTRACTION, BILATERAL     on different occassions    [place] OF UTERUS  11/06/2013   HAND LIGAMENT RECONSTRUCTION Right ~ 2010   HYSTEROSCOPY WITH D & C N/A 11/06/2013   Procedure: DILATATION AND CURETTAGE /HYSTEROSCOPY;  Surgeon: Jonnie Kind, MD;  Location: [facility];  Service: Gynecology;  Laterality: N/A;   INSERT / REPLACE / REMOVE PACEMAKER  06/22/2017   LUNG LOBECTOMY Right 1988   Fungal Infection   PACEMAKER IMPLANT N/A 06/22/2017   Procedure: PACEMAKER IMPLANT;  Surgeon: Deboraha Sprang, MD;  Location: [facility];  Service: Cardiovascular;  Laterality: N/A;   POLYPECTOMY N/A 11/06/2013   Procedure: POLYPECTOMY (REMOVAL ENDOMETRIAL POLYP);  Surgeon: Jonnie Kind, MD;  Location: [facility];   Service: Gynecology;  Laterality: N/A;   RECTOCELE REPAIR N/A 11/06/2013   Procedure: POSTERIOR REPAIR (RECTOCELE);  Surgeon: Jonnie Kind, MD;  Location: [facility];  Service: Gynecology;  Laterality: N/A;   SHOULDER ARTHROSCOPY WITH OPEN ROTATOR CUFF REPAIR Left 07/26/2012   TONSILLECTOMY  1949   TUBAL LIGATION       SOCIAL HISTORY:  Social History   Socioeconomic History   Marital status: Married    Spouse name: Not on file   Number of children: Not on file   Years of education: Not on file   Highest education level: Not on file  Occupational History   Occupation: retired     Comment: Product manager for [employer]  Tobacco Use   Smoking status: Never Smoker   Smokeless tobacco: Never Used  Scientific laboratory technician Use: Never used  Substance and Sexual Activity   Alcohol use: Yes    Alcohol/week: 0.0 standard drinks    Comment: 06/22/2017 "might have a drink twice a year"   Drug use: No   Sexual activity: Not Currently    Birth control/protection: Post-menopausal  Other Topics Concern   Not on file  Social History Narrative   Not on file   Social Determinants of Health   Financial Resource Strain:    Difficulty of Paying Living Expenses:   Food Insecurity:    Worried About Charity fundraiser in the Last Year:    Arboriculturist in the Last Year:   Transportation Needs:    Film/video editor (Medical):    Lack of Transportation (Non-Medical):   Physical Activity:    Days of Exercise per Week:    Minutes of Exercise per Session:   Stress:    Feeling of Stress :   Social Connections:    Frequency of Communication with Friends and Family:    Frequency of Social Gatherings with Friends and Family:    Attends Religious Services:    Active Member of Clubs or Organizations:    Attends Music therapist:    Marital Status:   Intimate Partner Violence:    Fear of Current or Ex-Partner:    Emotionally Abused:     Physically Abused:    Sexually Abused:     FAMILY HISTORY:  Family History  Problem Relation Age of Onset   Hypertension Mother    Heart disease Mother    Diabetes Mother    Kidney disease Mother        ESRD   Cancer Mother        Bladder   Hypertension Father    Hyperlipidemia Father    Heart disease Father    Diabetes Father    Cancer Father        Stomach Cancer   Diabetes Sister    Heart disease Sister    Alzheimer's disease Paternal Aunt    Stroke Maternal Grandfather    Cancer Paternal Grandfather  stomach   Cancer Maternal Uncle        prostate   Cancer Paternal Uncle        stomach    CURRENT MEDICATIONS:  Outpatient Encounter Medications as of 11/07/2019  Medication Sig   Aloe Vera Leaf POWD Take 800 mg by mouth 2 (two) times daily.    Ascorbic Acid (VITAMIN C) 1000 MG tablet Take 1,000 mg by mouth 2 (two) times daily.   b complex vitamins tablet Take 1 tablet by mouth 2 (two) times daily.    Blood Glucose Monitoring Suppl (BLOOD GLUCOSE SYSTEM PAK) KIT Use as directed to monitor FSBS 1x daily. Dx: E11.9. Please dispense as Accu-Chek Aviva   Cholecalciferol (VITAMIN D3) 2000 UNITS TABS Take 2,000 Units by mouth 2 (two) times daily.   Echinacea 380 MG CAPS Take 1 capsule by mouth as needed (cold/flue).   fluocinonide cream (LIDEX) 0.63 % Apply 1 application topically 2 (two) times daily as needed (poison ivy/bites/hives).   Ginkgo Biloba 120 MG CAPS Take 1 capsule by mouth daily.    glipiZIDE (GLUCOTROL) 10 MG tablet TAKE 1/2 (ONE-HALF) TABLET BY MOUTH TWICE DAILY BEFORE A MEAL FOR DIABETES   Glucose Blood (BLOOD GLUCOSE TEST STRIPS) STRP Use as directed to monitor FSBS 1x daily. Dx: E11.9. Please dispense as Accu-Chek Aviva   KRILL OIL PO Take 1 capsule by mouth 2 (two) times daily.   Lancets MISC Use as directed to monitor FSBS 1x daily. Dx: E11.9. Please dispense as Accu-Chek Aviva   Melatonin 3 MG TABS Take 3 mg by mouth  at bedtime as needed and may repeat dose one time if needed (sleep).    metFORMIN (GLUCOPHAGE) 1000 MG tablet TAKE 1 TABLET BY MOUTH TWICE DAILY WITH A MEAL FOR DIABETES   metoprolol succinate (TOPROL-XL) 25 MG 24 hr tablet Take 0.5 tablets (12.5 mg total) by mouth daily.   Multiple Vitamin (MULTIVITAMIN WITH MINERALS) TABS tablet Take 1 tablet by mouth daily.   NONFORMULARY OR COMPOUNDED ITEM Apply 1 application topically 2 (two) times daily as needed. Reported on 06/10/2015   rosuvastatin (CRESTOR) 5 MG tablet TAKE 1 TABLET BY MOUTH ONCE DAILY FOR CHOLESTEROL   simethicone (MYLICON) 80 MG chewable tablet Chew 80 mg by mouth as needed for flatulence.    tiZANidine (ZANAFLEX) 4 MG tablet TAKE 1/2 (ONE-HALF) TABLET BY MOUTH EVERY 6 HOURS AS NEEDED FOR MUSCLE SPASM   triamcinolone cream (KENALOG) 0.1 % Apply 1 application topically 2 (two) times daily. (Patient taking differently: Apply 1 application topically as needed. )   TRULICITY 0.16 WF/0.9NA SOPN INJECT 1 SYRINGE SUBCUTANEOUSLY ONCE A WEEK   acetaminophen (TYLENOL) 500 MG tablet Take 500 mg by mouth as needed for moderate pain. Takes 2 when needed (Patient not taking: Reported on 11/07/2019)   albuterol (PROVENTIL HFA;VENTOLIN HFA) 108 (90 Base) MCG/ACT inhaler Inhale 2 puffs into the lungs every 4 (four) hours as needed. (Patient not taking: Reported on 11/07/2019)   LORazepam (ATIVAN) 0.5 MG tablet Take 1 tablet (0.5 mg total) by mouth 2 (two) times daily as needed for anxiety. (Patient not taking: Reported on 11/07/2019)   torsemide (DEMADEX) 20 MG tablet Take 20 mg by mouth daily. (Patient not taking: Reported on 11/07/2019)   No facility-administered encounter medications on file as of 11/07/2019.    ALLERGIES:  Allergies  Allergen Reactions   Arimidex [Anastrozole] Other (See Comments)    Arthralgias and myalgias.  Improved with 3 week hiatus from drug.  Categorical side effect of  drug class.   Adhesive [Tape] Other (See  Comments)    "red and blistered"   Lisinopril     Dry hacking cough and tickle   Tetanus Toxoids    Tetracyclines & Related Other (See Comments)    unknown     PHYSICAL EXAM:  ECOG Performance status: 1  Vitals:   11/07/19 1046  BP: (!) 136/53  Pulse: 92  Resp: 18  Temp: (!) 96.9 F (36.1 C)  SpO2: 99%   Filed Weights   11/07/19 1046  Weight: 225 lb 5 oz (102.2 kg)      Physical Exam Constitutional:      Appearance: Normal appearance. She is normal weight.  Cardiovascular:     Rate and Rhythm: Normal rate and regular rhythm.     Heart sounds: Normal heart sounds.  Pulmonary:     Effort: Pulmonary effort is normal.     Breath sounds: Normal breath sounds.  Abdominal:     General: Bowel sounds are normal.     Palpations: Abdomen is soft.  Musculoskeletal:        General: Normal range of motion.  Skin:    General: Skin is warm.  Neurological:     Mental Status: She is alert and oriented to person, place, and time. Mental status is at baseline.  Psychiatric:        Mood and Affect: Mood normal.        Behavior: Behavior normal.        Thought Content: Thought content normal.        Judgment: Judgment normal.      LABORATORY DATA:  I have reviewed the labs as listed.  CBC    Component Value Date/Time   WBC 10.0 10/12/2019 1046   RBC 5.01 10/12/2019 1046   HGB 13.6 10/12/2019 1046   HCT 43.0 10/12/2019 1046   PLT 264 10/12/2019 1046   MCV 85.8 10/12/2019 1046   MCH 27.1 10/12/2019 1046   MCHC 31.6 10/12/2019 1046   RDW 13.9 10/12/2019 1046   LYMPHSABS 2.9 10/12/2019 1046   MONOABS 0.7 10/12/2019 1046   EOSABS 0.3 10/12/2019 1046   BASOSABS 0.0 10/12/2019 1046   CMP Latest Ref Rng & Units 10/12/2019 07/23/2019 04/26/2019  Glucose 70 - 99 mg/dL 186(H) 123(H) 109(H)  BUN 8 - 23 mg/dL _0 Creatinine 0.44 - 1.00 mg/dL 0.76 0.85 0.92  Sodium 135 - 145 mmol/L 138 145 143  Potassium 3.5 - 5.1 mmol/L 4.2 3.8 4.1  Chloride 98 - 111 mmol/L 104 105  103  CO2 22 - 32 mmol/L _1 Calcium 8.9 - 10.3 mg/dL 9.2 9.8 9.7  Total Protein 6.5 - 8.1 g/dL 7.1 6.6 6.5  Total Bilirubin 0.3 - 1.2 mg/dL 0.6 0.4 0.5  Alkaline Phos 38 - 126 U/L 51 - -  AST 15 - 41 U/L _2 ALT 0 - 44 U/L _3 DIAGNOSTIC IMAGING:  I have independently reviewed the mammogram scans and discussed with the patient.  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 -Oncotype DX score of 0, declined XRT. -Anastrozole from March 2013 through December 2013, discontinued secondary to arthralgias and myalgias. -Took tamoxifen for 4 years from January 2014 through January 2018. -Left breast ADH and ALH, status post lumpectomy on 09/12/2013. -Physical examination today reveals thickened area in the medial aspect  of the right breast.  This is been stable with no other palpable masses.  No palpable adenopathy. -Screening mammogram done on 10/12/2019 showed BI-RADS Category 0 incomplete -Diagnostic mammogram done on 11/06/2019 showed BI-RADS Category 4 suspicious showed a 0.8 cm mass. -Patient refused to set up biopsy due to wanting to discuss it with [place]. -We will set up her biopsy and she will see [place] back in 3 weeks.      Orders placed this encounter:  No orders of the defined types were placed in this encounter.     Francene Finders, FNP-C [place] [phone]

## 2019-11-27 ENCOUNTER — Other Ambulatory Visit (HOSPITAL_COMMUNITY): Payer: Self-pay | Admitting: Family Medicine

## 2019-11-27 ENCOUNTER — Other Ambulatory Visit: Payer: Self-pay

## 2019-11-27 ENCOUNTER — Ambulatory Visit (HOSPITAL_COMMUNITY)
Admission: RE | Admit: 2019-11-27 | Discharge: 2019-11-27 | Disposition: A | Discharge status: Home / Self Care | Payer: Medicare Other | Source: Ambulatory Visit | Attending: Hematology | Admitting: Hematology

## 2019-11-27 ENCOUNTER — Ambulatory Visit (HOSPITAL_COMMUNITY)
Admission: RE | Admit: 2019-11-27 | Discharge: 2019-11-27 | Disposition: A | Discharge status: Home / Self Care | Payer: Medicare Other | Source: Ambulatory Visit | Attending: Family Medicine | Admitting: Family Medicine

## 2019-11-27 DIAGNOSIS — R928 Other abnormal and inconclusive findings on diagnostic imaging of breast: Secondary | ICD-10-CM | POA: Insufficient documentation

## 2019-11-27 DIAGNOSIS — C50811 Malignant neoplasm of overlapping sites of right female breast: Secondary | ICD-10-CM | POA: Diagnosis not present

## 2019-11-27 DIAGNOSIS — N6315 Unspecified lump in the right breast, overlapping quadrants: Secondary | ICD-10-CM | POA: Diagnosis not present

## 2019-11-27 MED ORDER — LIDOCAINE HCL (PF) 2 % IJ SOLN
INTRAMUSCULAR | Status: AC
  Filled 2019-11-27: qty 10

## 2019-11-28 ENCOUNTER — Other Ambulatory Visit: Payer: Self-pay

## 2019-11-29 LAB — SURGICAL PATHOLOGY

## 2019-11-30 ENCOUNTER — Other Ambulatory Visit: Payer: Self-pay

## 2019-11-30 ENCOUNTER — Other Ambulatory Visit: Payer: Medicare Other

## 2019-11-30 DIAGNOSIS — K760 Fatty (change of) liver, not elsewhere classified: Secondary | ICD-10-CM

## 2019-11-30 DIAGNOSIS — N182 Chronic kidney disease, stage 2 (mild): Secondary | ICD-10-CM | POA: Diagnosis not present

## 2019-11-30 DIAGNOSIS — I10 Essential (primary) hypertension: Secondary | ICD-10-CM | POA: Diagnosis not present

## 2019-11-30 DIAGNOSIS — E782 Mixed hyperlipidemia: Secondary | ICD-10-CM

## 2019-11-30 DIAGNOSIS — E1143 Type 2 diabetes mellitus with diabetic autonomic (poly)neuropathy: Secondary | ICD-10-CM

## 2019-12-01 LAB — CBC WITH DIFFERENTIAL/PLATELET
Absolute Monocytes: 756 cells/uL (ref 200–950)
Basophils Absolute: 27 cells/uL (ref 0–200)
Basophils Relative: 0.3 %
Eosinophils Absolute: 252 cells/uL (ref 15–500)
Eosinophils Relative: 2.8 %
HCT: 39.8 % (ref 35.0–45.0)
Hemoglobin: 12.9 g/dL (ref 11.7–15.5)
Lymphs Abs: 2223 cells/uL (ref 850–3900)
MCH: 26.9 pg — ABNORMAL LOW (ref 27.0–33.0)
MCHC: 32.4 g/dL (ref 32.0–36.0)
MCV: 83.1 fL (ref 80.0–100.0)
MPV: 11 fL (ref 7.5–12.5)
Monocytes Relative: 8.4 %
Neutro Abs: 5742 cells/uL (ref 1500–7800)
Neutrophils Relative %: 63.8 %
Platelets: 238 10*3/uL (ref 140–400)
RBC: 4.79 10*6/uL (ref 3.80–5.10)
RDW: 13.5 % (ref 11.0–15.0)
Total Lymphocyte: 24.7 %
WBC: 9 10*3/uL (ref 3.8–10.8)

## 2019-12-01 LAB — HEMOGLOBIN A1C
Hgb A1c MFr Bld: 6.3 % of total Hgb — ABNORMAL HIGH (ref <5.7)
Mean Plasma Glucose: 134 (calc)
eAG (mmol/L): 7.4 (calc)

## 2019-12-01 LAB — LIPID PANEL
Cholesterol: 138 mg/dL (ref <200)
HDL: 44 mg/dL — ABNORMAL LOW (ref >=50)
LDL Cholesterol (Calc): 67 mg/dL (calc)
Non-HDL Cholesterol (Calc): 94 mg/dL (calc) (ref <130)
Total CHOL/HDL Ratio: 3.1 (calc) (ref <5.0)
Triglycerides: 196 mg/dL — ABNORMAL HIGH (ref <150)

## 2019-12-01 LAB — COMPREHENSIVE METABOLIC PANEL
AG Ratio: 1.7 (calc) (ref 1.0–2.5)
ALT: 12 U/L (ref 6–29)
AST: 15 U/L (ref 10–35)
Albumin: 3.9 g/dL (ref 3.6–5.1)
Alkaline phosphatase (APISO): 48 U/L (ref 37–153)
BUN: 21 mg/dL (ref 7–25)
CO2: 26 mmol/L (ref 20–32)
Calcium: 9.5 mg/dL (ref 8.6–10.4)
Chloride: 105 mmol/L (ref 98–110)
Creat: 0.87 mg/dL (ref 0.60–0.93)
Globulin: 2.3 g/dL (calc) (ref 1.9–3.7)
Glucose, Bld: 107 mg/dL — ABNORMAL HIGH (ref 65–99)
Potassium: 4.5 mmol/L (ref 3.5–5.3)
Sodium: 141 mmol/L (ref 135–146)
Total Bilirubin: 0.5 mg/dL (ref 0.2–1.2)
Total Protein: 6.2 g/dL (ref 6.1–8.1)

## 2019-12-02 ENCOUNTER — Other Ambulatory Visit: Payer: Self-pay | Admitting: Family Medicine

## 2019-12-04 ENCOUNTER — Ambulatory Visit (INDEPENDENT_AMBULATORY_CARE_PROVIDER_SITE_OTHER): Payer: Medicare Other | Admitting: Family Medicine

## 2019-12-04 ENCOUNTER — Encounter: Payer: Self-pay | Admitting: Family Medicine

## 2019-12-04 ENCOUNTER — Other Ambulatory Visit: Payer: Self-pay

## 2019-12-04 VITALS — BP 130/62 | HR 101 | Temp 97.7°F | Resp 18 | Wt 228.4 lb

## 2019-12-04 DIAGNOSIS — E1143 Type 2 diabetes mellitus with diabetic autonomic (poly)neuropathy: Secondary | ICD-10-CM | POA: Diagnosis not present

## 2019-12-04 DIAGNOSIS — I10 Essential (primary) hypertension: Secondary | ICD-10-CM | POA: Diagnosis not present

## 2019-12-04 DIAGNOSIS — E782 Mixed hyperlipidemia: Secondary | ICD-10-CM | POA: Diagnosis not present

## 2019-12-04 DIAGNOSIS — E1141 Type 2 diabetes mellitus with diabetic mononeuropathy: Secondary | ICD-10-CM

## 2019-12-04 DIAGNOSIS — E669 Obesity, unspecified: Secondary | ICD-10-CM

## 2019-12-04 DIAGNOSIS — N182 Chronic kidney disease, stage 2 (mild): Secondary | ICD-10-CM | POA: Diagnosis not present

## 2019-12-04 DIAGNOSIS — K5909 Other constipation: Secondary | ICD-10-CM | POA: Diagnosis not present

## 2019-12-04 MED ORDER — NAFTIFINE HCL 1 % EX CREA: TOPICAL_CREAM | Freq: Two times a day (BID) | CUTANEOUS | 2 refills | Status: AC | PRN

## 2019-12-04 NOTE — Assessment & Plan Note (Signed)
Controlled no changes 

## 2019-12-04 NOTE — Assessment & Plan Note (Signed)
Continue trulicity, continue dietary changes Watching sweets

## 2019-12-04 NOTE — Assessment & Plan Note (Signed)
A1C at goal, continue trulicity and metformin, glipizide 5mg  BID

## 2019-12-04 NOTE — Patient Instructions (Signed)
F/U 4 months for wellness

## 2019-12-04 NOTE — Assessment & Plan Note (Signed)
Recommend she restart fiber a few times a week as it helped better than the other meds  Benign abd exam

## 2019-12-04 NOTE — Assessment & Plan Note (Signed)
TG much improved Continue Krill oil and crestor

## 2019-12-04 NOTE — Progress Notes (Signed)
   Subjective:    Patient ID: Julie Clay, female    DOB: [birth year], 79 y.o.   MRN: [id number]  Patient presents for Follow-up (hypertension)  Patient here to follow-up chronic medical problems.  Medications reviewed. Diabetes mellitus her recent labs are reviewed A1c down to 6.3%   currently on Trulicity 7.20 mg weekly with the 5 mg and Metformin at 1000 mg twice a day. No hypoglycemia symptoms   Hyperlipidemia with hypertriglyceridemia.  She is still on Crestor 5 mg once a day and omega 3 fish oil she has chronically elevated triglycerides  TG were improved by 100 points down to  196, LDL 67   She is no longer in cardiac rehab    Hypertension-  she is taking her medications as prescribed blood pressure has been well controlled  She has been diagnosed with right breast cancer which is a recurrence, she is scheduled to see surgeon next week  She continues to have difficulty with her bowels, has constipation, but occ will blow out and have a diarrheal stool She stopped metamucil when she came off daily diuretic   Needs antifungal foot cream refilled  Review Of Systems:  GEN- denies fatigue, fever, weight loss,weakness, recent illness HEENT- denies eye drainage, change in vision, nasal discharge, CVS- denies chest pain, palpitations RESP- denies SOB, cough, wheeze ABD- denies N/V, change in stools, abd pain GU- denies dysuria, hematuria, dribbling, incontinence MSK- denies joint pain, muscle aches, injury Neuro- denies headache, dizziness, syncope, seizure activity       Objective:    BP 130/62 (BP Location: Right Arm, Patient Position: Sitting, Cuff Size: Large)   Pulse (!) 101   Temp 97.7 F (36.5 C) (Temporal)   Resp 18   Wt 228 lb 6.4 oz (103.6 kg)   SpO2 95%   BMI 38.01 kg/m  GEN- NAD, alert and oriented x3 HEENT- PERRL, EOMI, non injected sclera, pink conjunctiva, MMM, oropharynx clear Neck- Supple, no thyromegaly, C spine NT  CVS- RRR, no  murmur RESP-CTAB ABD-NABS,soft,NT,ND MSK fair range of motion of the upper extremities, mild lymphedema in the left upper extremity EXT-trace ankle edema Pulses- Radial, DP- 2+     Assessment & Plan:    Labs reviewed at bedside    Problem List Items Addressed This Visit      Unprioritized   Chronic constipation    Recommend she restart fiber a few times a week as it helped better than the other meds  Benign abd exam       CKD (chronic kidney disease), stage II   Class 2 obesity    Continue trulicity, continue dietary changes Watching sweets      Diabetic neuropathy (HCC)   DM (diabetes mellitus) ([place])    A1C at goal, continue trulicity and metformin, glipizide 5mg  BID       Relevant Orders   HM DIABETES FOOT EXAM (Completed)   Essential hypertension, benign - Primary    Controlled no changes       Hyperlipidemia    TG much improved Continue Krill oil and crestor         Note: This dictation was prepared with Sales executive along with smaller Company secretary. Any transcriptional errors that result from this process are unintentional.

## 2019-12-05 ENCOUNTER — Telehealth: Payer: Self-pay | Admitting: Family Medicine

## 2019-12-05 NOTE — Progress Notes (Signed)
  Chronic Care Management   Note  12/05/2019 Name: Julie Clay MRN: [id number] DOB: 1940-09-07  Julie Clay is a 79 y.o. year old female who is a primary care patient of [place], Modena Nunnery, MD. I reached out to [employer] by phone today in response to a referral sent by Julie Clay PCP, Buelah Manis, Modena Nunnery, MD.   Julie Clay was given information about Chronic Care Management services today including:  1. CCM service includes personalized support from designated clinical staff supervised by her physician, including individualized plan of care and coordination with other care providers 2. 24/7 contact phone numbers for assistance for urgent and routine care needs. 3. Service will only be billed when office clinical staff spend 20 minutes or more in a month to coordinate care. 4. Only one practitioner may furnish and bill the service in a calendar month. 5. The patient may stop CCM services at any time (effective at the end of the month) by phone call to the office staff.   Patient agreed to services and verbal consent obtained.   Follow up plan:   Carley Perdue [employer] Scheduler

## 2019-12-10 ENCOUNTER — Other Ambulatory Visit: Payer: Self-pay | Admitting: Family Medicine

## 2019-12-11 ENCOUNTER — Ambulatory Visit (INDEPENDENT_AMBULATORY_CARE_PROVIDER_SITE_OTHER): Payer: Medicare Other | Admitting: General Surgery

## 2019-12-11 ENCOUNTER — Other Ambulatory Visit: Payer: Self-pay

## 2019-12-11 ENCOUNTER — Encounter: Payer: Self-pay | Admitting: General Surgery

## 2019-12-11 VITALS — BP 144/84 | HR 70 | Temp 98.1°F | Resp 12 | Ht 65.0 in | Wt 226.0 lb

## 2019-12-11 DIAGNOSIS — C50911 Malignant neoplasm of unspecified site of right female breast: Secondary | ICD-10-CM | POA: Diagnosis not present

## 2019-12-11 NOTE — Progress Notes (Signed)
Julie Clay; [id number]; Aug 19, 1940   HPI Patient is a 79 year old white female who was referred to my care by oncology and Dr. Buelah Manis for evaluation and treatment of a newly diagnosed infiltrating ductal carcinoma of the right breast.  This was found on routine mammography and is biopsy positive.  Patient does have a remote history of left breast cancer initially diagnosed in 2012.  She was treated with tamoxifen at that time.  She refused radiation therapy.  She does not feel a lump.  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 Past Medical History:  Diagnosis Date  . Asthma    spirometry (2011)- mild ventilary defect  . Bone spur    "heels, knees, left shoulder" (06/22/2017)  . Breast cancer, left breast ([place]) dx'd 03/2011  . Chronic bronchitis ([place])   . COPD (chronic obstructive pulmonary disease) ([place])   . Fatty liver 2013   enlarged  . GERD (gastroesophageal reflux disease)   . HSV (herpes simplex virus) infection   . Hyperlipidemia   . Hypertension   . Lung nodule seen on imaging study 2013  . Lymphedema    Left arm  . 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 Osteoarthritis    "all over" (06/22/2017)  . Sleep apnea    Stop Bang score of 5. Pt has had sleep study, but was shown to be negative for sleep apnea.  . Type II diabetes mellitus ([place])   . Ventral hernia, unspecified, without mention of obstruction or gangrene     Past Surgical History:  Procedure Laterality Date  . BREAST BIOPSY Left 04/2011  . BREAST BIOPSY Left 06/22/13  . BREAST BIOPSY Left 09/12/2013   Procedure: BREAST BIOPSY WITH NEEDLE LOCALIZATION;  Surgeon: Jamesetta So, MD;  Location: [facility];  Service: General;  Laterality: Left;  . BREAST LUMPECTOMY Left 04/2011 X 2  . CATARACT EXTRACTION W/ INTRAOCULAR LENS  IMPLANT, BILATERAL Bilateral   . CATARACT EXTRACTION, BILATERAL     on different occassions   . [place]  . DILATION  AND CURETTAGE OF UTERUS  11/06/2013  . HAND LIGAMENT RECONSTRUCTION Right ~ 2010  . HYSTEROSCOPY WITH D & C N/A 11/06/2013   Procedure: DILATATION AND CURETTAGE /HYSTEROSCOPY;  Surgeon: Jonnie Kind, MD;  Location: [facility];  Service: Gynecology;  Laterality: N/A;  . INSERT / REPLACE / REMOVE PACEMAKER  06/22/2017  . LUNG LOBECTOMY Right 1988   Fungal Infection  . PACEMAKER IMPLANT N/A 06/22/2017   Procedure: PACEMAKER IMPLANT;  Surgeon: Deboraha Sprang, MD;  Location: [facility];  Service: Cardiovascular;  Laterality: N/A;  . POLYPECTOMY N/A 11/06/2013   Procedure: POLYPECTOMY (REMOVAL ENDOMETRIAL POLYP);  Surgeon: Jonnie Kind, MD;  Location: [facility];  Service: Gynecology;  Laterality: N/A;  . RECTOCELE REPAIR N/A 11/06/2013   Procedure: POSTERIOR REPAIR (RECTOCELE);  Surgeon: Jonnie Kind, MD;  Location: [facility];  Service: Gynecology;  Laterality: N/A;  . SHOULDER ARTHROSCOPY WITH OPEN ROTATOR CUFF REPAIR Left 07/26/2012  . TONSILLECTOMY  1949  . TUBAL LIGATION      Family History  Problem Relation Age of Onset  . Hypertension Mother   . Heart disease Mother   . Diabetes Mother   . Kidney disease Mother        ESRD  . Cancer Mother        Bladder  . Hypertension  Father   . Hyperlipidemia Father   . Heart disease Father   . Diabetes Father   . Cancer Father        Stomach Cancer  . Diabetes Sister   . Heart disease Sister   . Alzheimer's disease Paternal Aunt   . Stroke Maternal Grandfather   . Cancer Paternal Grandfather        stomach  . Cancer Maternal Uncle        prostate  . Cancer Paternal Uncle        stomach    Current Outpatient Medications on File Prior to Visit  Medication Sig Dispense Refill  . acetaminophen (TYLENOL) 500 MG tablet Take 500 mg by mouth as needed for moderate pain. Takes 2 when needed    . Aloe Vera Leaf POWD Take 800 mg by mouth 2 (two) times daily.     . Ascorbic Acid (VITAMIN C) 1000 MG tablet Take 1,000 mg by mouth 2 (two) times  daily.    Marland Kitchen b complex vitamins tablet Take 1 tablet by mouth 2 (two) times daily.     . Blood Glucose Monitoring Suppl (BLOOD GLUCOSE SYSTEM PAK) KIT Use as directed to monitor FSBS 1x daily. Dx: E11.9. Please dispense as Accu-Chek Aviva 1 each 1  . Cholecalciferol (VITAMIN D3) 2000 UNITS TABS Take 2,000 Units by mouth 2 (two) times daily.    . Echinacea 380 MG CAPS Take 1 capsule by mouth as needed (cold/flue).    . fluocinonide cream (LIDEX) 2.56 % Apply 1 application topically 2 (two) times daily as needed (poison ivy/bites/hives). 30 g 11  . Ginkgo Biloba 120 MG CAPS Take 1 capsule by mouth daily.     Marland Kitchen glipiZIDE (GLUCOTROL) 10 MG tablet TAKE 1/2 (ONE-HALF) TABLET BY MOUTH TWICE DAILY BEFORE A MEAL FOR DIABETES 180 tablet 0  . Glucose Blood (BLOOD GLUCOSE TEST STRIPS) STRP Use as directed to monitor FSBS 1x daily. Dx: E11.9. Please dispense as Accu-Chek Aviva 100 each 1  . KRILL OIL PO Take 1 capsule by mouth 2 (two) times daily.    . Lancets MISC Use as directed to monitor FSBS 1x daily. Dx: E11.9. Please dispense as Accu-Chek Aviva 100 each 1  . LORazepam (ATIVAN) 0.5 MG tablet Take 1 tablet (0.5 mg total) by mouth 2 (two) times daily as needed for anxiety. 30 tablet 1  . Melatonin 3 MG TABS Take 3 mg by mouth at bedtime as needed and may repeat dose one time if needed (sleep).     . metFORMIN (GLUCOPHAGE) 1000 MG tablet TAKE 1 TABLET BY MOUTH TWICE DAILY WITH A MEAL FOR DIABETES 180 tablet 0  . metoprolol succinate (TOPROL-XL) 25 MG 24 hr tablet Take 0.5 tablets (12.5 mg total) by mouth daily. 45 tablet 3  . Multiple Vitamin (MULTIVITAMIN WITH MINERALS) TABS tablet Take 1 tablet by mouth daily.    . naftifine (NAFTIN) 1 % cream Apply topically 2 (two) times daily as needed. 60 g 2  . NONFORMULARY OR COMPOUNDED ITEM Apply 1 application topically 2 (two) times daily as needed. Reported on 06/10/2015    . rosuvastatin (CRESTOR) 5 MG tablet TAKE 1 TABLET BY MOUTH ONCE DAILY FOR CHOLESTEROL 90  tablet 0  . simethicone (MYLICON) 80 MG chewable tablet Chew 80 mg by mouth as needed for flatulence.     Marland Kitchen tiZANidine (ZANAFLEX) 4 MG tablet TAKE 1/2 (ONE-HALF) TABLET BY MOUTH EVERY 6 HOURS AS NEEDED FOR MUSCLE SPASM 30 tablet 2  . torsemide (  DEMADEX) 20 MG tablet Take 20 mg by mouth daily.     . TRULICITY 3.25 QD/8.2ME SOPN INJECT 1 SYRINGE SUBCUTANEOUSLY ONCE A WEEK 0.5 mL 2   No current facility-administered medications on file prior to visit.    Allergies  Allergen Reactions  . Arimidex [Anastrozole] Other (See Comments)    Arthralgias and myalgias.  Improved with 3 week hiatus from drug.  Categorical side effect of drug class.  . Adhesive [Tape] Other (See Comments)    "red and blistered"  . Lisinopril     Dry hacking cough and tickle  . Tetanus Toxoids   . Tetracyclines & Related Other (See Comments)    unknown    Social History   Substance and Sexual Activity  Alcohol Use Yes  . Alcohol/week: 0.0 standard drinks   Comment: 06/22/2017 "might have a drink twice a year"    Social History   Tobacco Use  Smoking Status Never Smoker  Smokeless Tobacco Never Used    Review of Systems  Constitutional: Positive for malaise/fatigue.  HENT: Negative.   Eyes: Negative.   Respiratory: Positive for shortness of breath.   Cardiovascular: Negative.   Gastrointestinal: Negative.   Genitourinary: Negative.   Musculoskeletal: Positive for back pain and joint pain.  Skin: Negative.   Neurological: Negative.   Endo/Heme/Allergies: Negative.   Psychiatric/Behavioral: Negative.     Objective   Vitals:   12/11/19 1333  BP: (!) 144/84  Pulse: 70  Resp: 12  Temp: 98.1 F (36.7 C)  SpO2: 95%    Physical Exam Vitals reviewed.  Constitutional:      Appearance: Normal appearance. She is obese. She is not ill-appearing.  HENT:     Head: Normocephalic and atraumatic.  Cardiovascular:     Rate and Rhythm: Normal rate and regular rhythm.     Heart sounds: No murmur heard.   No friction rub. No gallop.      Comments: Pacemaker in place right upper chest Pulmonary:     Effort: Pulmonary effort is normal. No respiratory distress.     Breath sounds: Normal breath sounds. No stridor. No wheezing, rhonchi or rales.  Lymphadenopathy:     Cervical: No cervical adenopathy.  Skin:    General: Skin is warm and dry.  Neurological:     Mental Status: She is alert and oriented to person, place, and time.   Breast: Well-healed surgical scars in the left breast.  No dominant mass, nipple discharge, dimpling noted.  Right breast examination reveals no dominant mass, nipple discharge, or dimpling.  The right axilla is negative for palpable nodes. Extremity examination does reveal left arm diffuse swelling.  Mammogram and pathology reports reviewed.  A 0.6 cm mass is noted at the 6 o'clock position in the right breast.  It is biopsy positive for infiltrating ductal carcinoma.  Assessment  Infiltrating ductal carcinoma right breast Plan   Patient would like to only have a lumpectomy.  I did tell her that ideally radiation therapy is indicated afterwards, but given her age, this recommendation has been decreased and not always recommended.  She is also nervous about having any axillary lymph node biopsy given her left arm lymphedema.  I told her I would discuss this with Dr. Delton Coombes of oncology and will notify her of our final recommendations.

## 2019-12-13 ENCOUNTER — Telehealth: Payer: Self-pay | Admitting: *Deleted

## 2019-12-13 ENCOUNTER — Telehealth: Payer: Self-pay

## 2019-12-13 DIAGNOSIS — C50911 Malignant neoplasm of unspecified site of right female breast: Secondary | ICD-10-CM

## 2019-12-13 NOTE — Telephone Encounter (Signed)
Referral to Cardiology placed and spoke with Renee who said she would contact our office with the appointment information.   Also spoke with patient to let her know to be expecting a call from Cardiology.

## 2019-12-13 NOTE — Telephone Encounter (Signed)
   Primary Cardiologist: Kate Sable, MD (Inactive) Now Dr. Domenic Polite   Chart reviewed as part of pre-operative protocol coverage. Patient was contacted 12/13/2019 in reference to pre-operative risk assessment for pending surgery as outlined below.  Julie Clay was last seen on 07/2019 by Bernerd Pho PAC.  Since that day, Julie Clay has done. She does not have a history of CAD, MI, or stroke. She can complete 4.4 METS without angina. She is primarily limited by her back pain. She is concerned about her device automatic download. I will send a message to our device clinic.  Therefore, based on ACC/AHA guidelines, the patient would be at acceptable risk for the planned procedure without further cardiovascular testing.   I will route this recommendation to the requesting party via Epic fax function and remove from pre-op pool. Please call with questions.  Ledora Bottcher, PA 12/13/2019, 3:44 PM

## 2019-12-13 NOTE — Telephone Encounter (Signed)
   [facility] Pre-operative Risk Assessment    HEARTCARE STAFF: - Please ensure there is not already an duplicate clearance open for this procedure. - Under Visit Info/Reason for Call, type in Other and utilize the format Clearance MM/DD/YY or Clearance TBD. Do not use dashes or single digits. - If request is for dental extraction, please clarify the # of teeth to be extracted.  Request for surgical clearance:  1. What type of surgery is being performed? PARTIAL MASTECTOMY W/NEEDLE LPC  2. When is this surgery scheduled? TBD   3. What type of clearance is required (medical clearance vs. Pharmacy clearance to hold med vs. Both)? CLEARANCE FORM LOOKS LIKE THEY ARE JUST REQUESTING CLEARANCE FOR DEVICE; SAYS CARDIAC CLEARANCE-PATIENT HAS PACEMAKER  4. Are there any medications that need to be held prior to surgery and how long? NONE LISTED   5. Practice name and name of physician performing surgery? DR. [place]   6. What is the office phone number? [phone]   7.   What is the office fax number? [phone]  8.   Anesthesia type (None, local, MAC, general) ? NOT LISTED (GENERAL?)   Julaine Hua 12/13/2019, 2:34 PM  _________________________________________________________________   (provider comments below)

## 2019-12-14 NOTE — Telephone Encounter (Signed)
[facility] PROGRAMM[facility]   Patient Information: Name: Tzivia Oneil  DOB: July 15, 1940  MRN: [id number]    Planned Procedure:  Partial mastectomy with Needle LPC (side not designated)  Surgeon:  Dr. Aviva Signs Date of Procedure:  TBD    Device Information:   Clinic EP Physician:   [name], MD Device Type:  Pacemaker Manufacturer and Phone #:  [employer]: [phone] Pacemaker Dependent?:  No Date of Last Device Check:  09/25/19 (remote) 07/31/19 (in-clinic)        Normal Device Function?:  Yes     Electrophysiologist's Recommendations:    Have magnet available.  Provide continuous ECG monitoring when magnet is used or reprogramming is to be performed.   Procedure will likely interfere with device function.  Device should be programmed:  Asynchronous pacing during procedure and returned to normal programming after procedure  Per Device Clinic Standing Orders, York Ram  12/14/2019 8:11 AM

## 2019-12-14 NOTE — Telephone Encounter (Signed)
Device clearance sent via Right fax.

## 2019-12-18 ENCOUNTER — Other Ambulatory Visit: Payer: Self-pay

## 2019-12-18 ENCOUNTER — Inpatient Hospital Stay (HOSPITAL_COMMUNITY): Payer: Medicare Other | Attending: Hematology | Admitting: Hematology

## 2019-12-18 VITALS — BP 149/75 | HR 81 | Temp 98.3°F | Resp 16 | Wt 227.2 lb

## 2019-12-18 DIAGNOSIS — R5383 Other fatigue: Secondary | ICD-10-CM | POA: Insufficient documentation

## 2019-12-18 DIAGNOSIS — R197 Diarrhea, unspecified: Secondary | ICD-10-CM | POA: Diagnosis not present

## 2019-12-18 DIAGNOSIS — E669 Obesity, unspecified: Secondary | ICD-10-CM | POA: Diagnosis not present

## 2019-12-18 DIAGNOSIS — R200 Anesthesia of skin: Secondary | ICD-10-CM | POA: Insufficient documentation

## 2019-12-18 DIAGNOSIS — C50511 Malignant neoplasm of lower-outer quadrant of right female breast: Secondary | ICD-10-CM | POA: Insufficient documentation

## 2019-12-18 DIAGNOSIS — Z79811 Long term (current) use of aromatase inhibitors: Secondary | ICD-10-CM | POA: Insufficient documentation

## 2019-12-18 DIAGNOSIS — K5900 Constipation, unspecified: Secondary | ICD-10-CM | POA: Diagnosis not present

## 2019-12-18 DIAGNOSIS — R109 Unspecified abdominal pain: Secondary | ICD-10-CM | POA: Insufficient documentation

## 2019-12-18 DIAGNOSIS — Z79899 Other long term (current) drug therapy: Secondary | ICD-10-CM | POA: Diagnosis not present

## 2019-12-18 DIAGNOSIS — M549 Dorsalgia, unspecified: Secondary | ICD-10-CM | POA: Insufficient documentation

## 2019-12-18 DIAGNOSIS — M2550 Pain in unspecified joint: Secondary | ICD-10-CM | POA: Insufficient documentation

## 2019-12-18 DIAGNOSIS — Z170 Estrogen receptor positive status [ER+]: Secondary | ICD-10-CM | POA: Insufficient documentation

## 2019-12-18 DIAGNOSIS — C50912 Malignant neoplasm of unspecified site of left female breast: Secondary | ICD-10-CM

## 2019-12-18 DIAGNOSIS — M791 Myalgia, unspecified site: Secondary | ICD-10-CM | POA: Diagnosis not present

## 2019-12-18 DIAGNOSIS — Z888 Allergy status to other drugs, medicaments and biological substances status: Secondary | ICD-10-CM | POA: Diagnosis not present

## 2019-12-18 NOTE — Progress Notes (Signed)
[facility] [address] [place] [address]   Patient Care Team: Alycia Rossetti, MD as PCP - General (Family Medicine) Herminio Commons, MD (Inactive) as PCP - Cardiology (Cardiology) Edythe Clarity, [facility] as Pharmacist (Pharmacist)  SUMMARY OF ONCOLOGIC HISTORY: Oncology History  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 ([place])  06/18/2011 - 04/18/2012 Chemotherapy   Arimidex.  D/C'd due to arthralgias and myalgias   04/19/2012 - 10/2016 Chemotherapy   Tamoxifen      CHIEF COMPLIANT: Follow up for left breast cancer   INTERVAL HISTORY: Julie Clay is a 79 y.o. female here today for follow up of her left breast cancer. Her last visit was on 10/12/2018.   Today she is accompanied by her husband. She is doing well.  Dr. Arnoldo Morale will proceed with a right lumpectomy at a future date.   REVIEW OF SYSTEMS:   Review of Systems  Constitutional: Positive for fatigue (moderate). Negative for appetite change.  Gastrointestinal: Positive for abdominal pain, constipation and diarrhea.  Musculoskeletal: Positive for back pain.  Neurological: Positive for numbness.  All other systems reviewed and are negative.   I have reviewed the past medical history, past surgical history, social history and family history with the patient and they are unchanged from previous note.   ALLERGIES:   is allergic to arimidex [anastrozole], adhesive [tape], lisinopril, tetanus toxoids, and tetracyclines & related.   MEDICATIONS:  Current Outpatient Medications  Medication Sig Dispense Refill  . acetaminophen (TYLENOL) 500 MG tablet Take 500 mg by mouth as needed for moderate pain. Takes 2 when needed    . Aloe Vera Leaf POWD Take 800 mg by mouth 2 (two) times daily.     . Ascorbic Acid (VITAMIN C) 1000 MG tablet Take 1,000 mg by mouth 2 (two) times daily.    Marland Kitchen b complex vitamins tablet Take 1 tablet by mouth 2 (two) times daily.     . Blood Glucose  Monitoring Suppl (BLOOD GLUCOSE SYSTEM PAK) KIT Use as directed to monitor FSBS 1x daily. Dx: E11.9. Please dispense as Accu-Chek Aviva 1 each 1  . Cholecalciferol (VITAMIN D3) 2000 UNITS TABS Take 2,000 Units by mouth 2 (two) times daily.    . Echinacea 380 MG CAPS Take 1 capsule by mouth as needed (cold/flue).    . fluocinonide cream (LIDEX) 5.39 % Apply 1 application topically 2 (two) times daily as needed (poison ivy/bites/hives). 30 g 11  . Ginkgo Biloba 120 MG CAPS Take 1 capsule by mouth in the morning and at bedtime.     Marland Kitchen glipiZIDE (GLUCOTROL) 10 MG tablet TAKE 1/2 (ONE-HALF) TABLET BY MOUTH TWICE DAILY BEFORE A MEAL FOR DIABETES 180 tablet 0  . Glucose Blood (BLOOD GLUCOSE TEST STRIPS) STRP Use as directed to monitor FSBS 1x daily. Dx: E11.9. Please dispense as Accu-Chek Aviva 100 each 1  . Krill Oil 1000 MG CAPS Take 1 capsule by mouth 2 (two) times daily.     . Lancets MISC Use as directed to monitor FSBS 1x daily. Dx: E11.9. Please dispense as Accu-Chek Aviva 100 each 1  . LORazepam (ATIVAN) 0.5 MG tablet Take 1 tablet (0.5 mg total) by mouth 2 (two) times daily as needed for anxiety. 30 tablet 1  . Melatonin 3 MG TABS Take 5 mg by mouth at bedtime as needed and may repeat dose one time if needed (sleep).     . metFORMIN (GLUCOPHAGE) 1000 MG tablet TAKE 1 TABLET BY  MOUTH TWICE DAILY WITH A MEAL FOR DIABETES 180 tablet 0  . metoprolol succinate (TOPROL-XL) 25 MG 24 hr tablet Take 0.5 tablets (12.5 mg total) by mouth daily. 45 tablet 3  . Multiple Vitamin (MULTIVITAMIN WITH MINERALS) TABS tablet Take 1 tablet by mouth daily.    . naftifine (NAFTIN) 1 % cream Apply topically 2 (two) times daily as needed. 60 g 2  . NONFORMULARY OR COMPOUNDED ITEM Apply 1 application topically 2 (two) times daily as needed. Reported on 06/10/2015    . rosuvastatin (CRESTOR) 5 MG tablet TAKE 1 TABLET BY MOUTH ONCE DAILY FOR CHOLESTEROL 90 tablet 0  . simethicone (MYLICON) 80 MG chewable tablet Chew 80 mg by  mouth as needed for flatulence.     Marland Kitchen tiZANidine (ZANAFLEX) 4 MG tablet TAKE 1/2 (ONE-HALF) TABLET BY MOUTH EVERY 6 HOURS AS NEEDED FOR MUSCLE SPASM 30 tablet 2  . torsemide (DEMADEX) 20 MG tablet Take 20 mg by mouth daily.     . TRULICITY 1.88 CZ/6.6AY SOPN INJECT 1 SYRINGE SUBCUTANEOUSLY ONCE A WEEK 0.5 mL 2   No current facility-administered medications for this visit.     PHYSICAL EXAMINATION: Performance status (ECOG): 1 - Symptomatic but completely ambulatory  Vitals:   12/18/19 1548  BP: (!) 149/75  Pulse: 81  Resp: 16  Temp: 98.3 F (36.8 C)  SpO2: 96%   Wt Readings from Last 3 Encounters:  12/18/19 227 lb 3.2 oz (103.1 kg)  12/11/19 226 lb (102.5 kg)  12/04/19 228 lb 6.4 oz (103.6 kg)   Physical Exam Vitals reviewed.  Constitutional:      Appearance: Normal appearance. She is obese.  Cardiovascular:     Rate and Rhythm: Normal rate and regular rhythm.     Pulses: Normal pulses.     Heart sounds: Normal heart sounds.  Pulmonary:     Effort: Pulmonary effort is normal.     Breath sounds: Normal breath sounds.  Chest:     Breasts:        Right: Mass (below nipple) present.   Musculoskeletal:     Right lower leg: No edema.     Left lower leg: No edema.  Neurological:     General: No focal deficit present.     Mental Status: She is alert and oriented to person, place, and time.  Psychiatric:        Mood and Affect: Mood normal.        Behavior: Behavior normal.     Breast Exam Chaperone: [name], MD     LABORATORY DATA:   I have reviewed the data as listed CMP Latest Ref Rng & Units 11/30/2019 10/12/2019 07/23/2019  Glucose 65 - 99 mg/dL 107(H) 186(H) 123(H)  BUN 7 - 25 mg/dL 21 18 21   Creatinine 0.60 - 0.93 mg/dL 0.87 0.76 0.85  Sodium 135 - 146 mmol/L 141 138 145  Potassium 3.5 - 5.3 mmol/L 4.5 4.2 3.8  Chloride 98 - 110 mmol/L 105 104 105  CO2 20 - 32 mmol/L 26 23 29   Calcium 8.6 - 10.4 mg/dL 9.5 9.2 9.8  Total Protein 6.1 - 8.1 g/dL 6.2  7.1 6.6  Total Bilirubin 0.2 - 1.2 mg/dL 0.5 0.6 0.4  Alkaline Phos 38 - 126 U/L - 51 -  AST 10 - 35 U/L 15 22 18   ALT 6 - 29 U/L 12 18 14    No results found for: TKZ601 Lab Results  Component Value Date   WBC 9.0 11/30/2019   HGB  12.9 11/30/2019   HCT 39.8 11/30/2019   MCV 83.1 11/30/2019   PLT 238 11/30/2019   NEUTROABS 5,742 11/30/2019   Surgical pathology [phone]) on 11/27/2019: Right breast mass biopsy at 6 o'clock: invasive ductal carcinoma and DCIS low-grade.  ASSESSMENT:  1.  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 -Anastrozole from March 2013 through December 2013, discontinued secondary to arthralgias and myalgias. - Took tamoxifen for 4 years from January 2014 through January 2018. - Left breast ADH and ALH, status post lumpectomy on 09/12/2013. - Mammogram of the right breast on 02/08/2017 showed new finding and biopsy was recommended. -Mammogram and ultrasound of the right breast done on 10/04/2017 showed BI-RADS Category 4 with indeterminate hyperdense mass without ultrasound correlate.  Stereotactic biopsy was recommended and declined by patient. -Mammogram on 10/12/2019 shows BI-RADS Category 0 in the right breast. -Right breast additional views on 11/06/2019 shows 0.8 x 0.6 x 0.5 cm irregular hypoechoic mass in the right breast at the 6 to 7 o'clock position.   PLAN:  1.  Stage I left breast infiltrating ductal carcinoma, ER/PR positive, HER-2 negative: -Mammogram on 10/12/2019 did not show any evidence of recurrence in the left breast.  2.  Stage I right breast cancer: -Right breast biopsy on 11/28/2019 shows right breast infiltrating ductal carcinoma with DCIS.  IDC was positive for ER/PR 100%.  Ki-67 was 5%.  HER-2 is negative.  We discussed the pathology report in detail.  I have recommended lumpectomy. -She has already seen Dr. Arnoldo Morale. -Based on choosing wisely guidelines,  we can proceed with lumpectomy without sentinel lymph node biopsy in woman older than 81 years and no clinically palpable lymph node. -She will have surgery done next week.  I will see her back in 6 weeks for follow-up.    No orders of the defined types were placed in this encounter.  The patient has a good understanding of the overall plan. she agrees with it. she will call with any problems that may develop before the next visit here.    Derek Jack, MD [facility] [phone]   I, [name], am acting as a scribe for Dr. Sanda Linger.  I, Derek Jack MD, have reviewed the above documentation for accuracy and completeness, and I agree with the above.

## 2019-12-18 NOTE — Patient Instructions (Signed)
[facility] at [facility] Discharge Instructions  You were seen today by Dr. Delton Coombes. He went over your recent results and scans. You may proceed with your procedure. Dr. Delton Coombes will see you back 6 weeks after your procedure for labs and follow up.   Thank you for choosing [facility] at [facility] to provide your oncology and hematology care.  To afford each patient quality time with our provider, please arrive at least 15 minutes before your scheduled appointment time.   If you have a lab appointment with the [facility] please come in thru the Main Entrance and check in at the main information desk  You need to re-schedule your appointment should you arrive 10 or more minutes late.  We strive to give you quality time with our providers, and arriving late affects you and other patients whose appointments are after yours.  Also, if you no show three or more times for appointments you may be dismissed from the clinic at the providers discretion.     Again, thank you for choosing [facility].  Our hope is that these requests will decrease the amount of time that you wait before being seen by our physicians.       _____________________________________________________________  Should you have questions after your visit to [facility], please contact our office at [phone] between the hours of 8:00 a.m. and 4:30 p.m.  Voicemails left after 4:00 p.m. will not be returned until the following business day.  For prescription refill requests, have your pharmacy contact our office and allow 72 hours.    Cancer Center Support Programs:   > Cancer Support Group  2nd Tuesday of the month 1pm-2pm, Journey Room

## 2019-12-20 ENCOUNTER — Other Ambulatory Visit (HOSPITAL_COMMUNITY): Payer: Self-pay | Admitting: General Surgery

## 2019-12-20 DIAGNOSIS — R928 Other abnormal and inconclusive findings on diagnostic imaging of breast: Secondary | ICD-10-CM

## 2019-12-25 ENCOUNTER — Telehealth: Payer: Self-pay | Admitting: Family Medicine

## 2019-12-25 ENCOUNTER — Encounter: Payer: Self-pay | Admitting: Family Medicine

## 2019-12-25 ENCOUNTER — Ambulatory Visit (INDEPENDENT_AMBULATORY_CARE_PROVIDER_SITE_OTHER): Payer: Medicare Other | Admitting: *Deleted

## 2019-12-25 DIAGNOSIS — I441 Atrioventricular block, second degree: Secondary | ICD-10-CM

## 2019-12-25 DIAGNOSIS — E119 Type 2 diabetes mellitus without complications: Secondary | ICD-10-CM | POA: Diagnosis not present

## 2019-12-25 LAB — CUP PACEART REMOTE DEVICE CHECK
Battery Remaining Longevity: 102 mo
Battery Remaining Percentage: 95.5 %
Battery Voltage: 2.99 V
Brady Statistic AP VP Percent: 4 %
Brady Statistic AP VS Percent: 1 %
Brady Statistic AS VP Percent: 96 %
Brady Statistic AS VS Percent: 1 %
Brady Statistic RA Percent Paced: 3.8 %
Brady Statistic RV Percent Paced: 99 %
Date Time Interrogation Session: 20210907020020
Implantable Lead Implant Date: 20190306
Implantable Lead Implant Date: 20190306
Implantable Lead Location: 753859
Implantable Lead Location: 753860
Implantable Lead Model: 5076
Implantable Lead Model: 5076
Implantable Pulse Generator Implant Date: 20190306
Lead Channel Impedance Value: 340 Ohm
Lead Channel Impedance Value: 430 Ohm
Lead Channel Pacing Threshold Amplitude: 0.5 V
Lead Channel Pacing Threshold Amplitude: 0.75 V
Lead Channel Pacing Threshold Pulse Width: 0.5 ms
Lead Channel Pacing Threshold Pulse Width: 0.5 ms
Lead Channel Sensing Intrinsic Amplitude: 3 mV
Lead Channel Sensing Intrinsic Amplitude: 5.6 mV
Lead Channel Setting Pacing Amplitude: 2 V
Lead Channel Setting Pacing Amplitude: 2.5 V
Lead Channel Setting Pacing Pulse Width: 0.5 ms
Lead Channel Setting Sensing Sensitivity: 2 mV
Pulse Gen Model: 2272
Pulse Gen Serial Number: 9001027

## 2019-12-25 LAB — HM DIABETES EYE EXAM

## 2019-12-25 NOTE — Telephone Encounter (Signed)
Called [employer] at [phone] to speak to the area rep to have someone in OR for pacemaker. Spoke to [place] and she will page the area Rep, which is [employer]le [employer], and have him contact me for further instructions. Number to have him paged is [phone]. Aaron Edelman did call back and he will have someone there to meet her and check her before surgery and if anything should change please call and let him know. He has a rep that lives in the [place] area and will have them meet her at [facility].

## 2019-12-27 ENCOUNTER — Other Ambulatory Visit: Payer: Self-pay | Admitting: Family Medicine

## 2019-12-27 NOTE — Progress Notes (Signed)
Remote pacemaker transmission.   

## 2019-12-27 NOTE — H&P (Signed)
Julie Clay; [id number]; Aug 19, 1940   HPI Patient is a 79 year old white female who was referred to my care by oncology and Dr. Buelah Manis for evaluation and treatment of a newly diagnosed infiltrating ductal carcinoma of the right breast.  This was found on routine mammography and is biopsy positive.  Patient does have a remote history of left breast cancer initially diagnosed in 2012.  She was treated with tamoxifen at that time.  She refused radiation therapy.  She does not feel a lump.  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 Past Medical History:  Diagnosis Date  . Asthma    spirometry (2011)- mild ventilary defect  . Bone spur    "heels, knees, left shoulder" (06/22/2017)  . Breast cancer, left breast ([place]) dx'd 03/2011  . Chronic bronchitis ([place])   . COPD (chronic obstructive pulmonary disease) ([place])   . Fatty liver 2013   enlarged  . GERD (gastroesophageal reflux disease)   . HSV (herpes simplex virus) infection   . Hyperlipidemia   . Hypertension   . Lung nodule seen on imaging study 2013  . Lymphedema    Left arm  . 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 Osteoarthritis    "all over" (06/22/2017)  . Sleep apnea    Stop Bang score of 5. Pt has had sleep study, but was shown to be negative for sleep apnea.  . Type II diabetes mellitus ([place])   . Ventral hernia, unspecified, without mention of obstruction or gangrene     Past Surgical History:  Procedure Laterality Date  . BREAST BIOPSY Left 04/2011  . BREAST BIOPSY Left 06/22/13  . BREAST BIOPSY Left 09/12/2013   Procedure: BREAST BIOPSY WITH NEEDLE LOCALIZATION;  Surgeon: Jamesetta So, MD;  Location: [facility];  Service: General;  Laterality: Left;  . BREAST LUMPECTOMY Left 04/2011 X 2  . CATARACT EXTRACTION W/ INTRAOCULAR LENS  IMPLANT, BILATERAL Bilateral   . CATARACT EXTRACTION, BILATERAL     on different occassions   . [place]  . DILATION  AND CURETTAGE OF UTERUS  11/06/2013  . HAND LIGAMENT RECONSTRUCTION Right ~ 2010  . HYSTEROSCOPY WITH D & C N/A 11/06/2013   Procedure: DILATATION AND CURETTAGE /HYSTEROSCOPY;  Surgeon: Jonnie Kind, MD;  Location: [facility];  Service: Gynecology;  Laterality: N/A;  . INSERT / REPLACE / REMOVE PACEMAKER  06/22/2017  . LUNG LOBECTOMY Right 1988   Fungal Infection  . PACEMAKER IMPLANT N/A 06/22/2017   Procedure: PACEMAKER IMPLANT;  Surgeon: Deboraha Sprang, MD;  Location: [facility];  Service: Cardiovascular;  Laterality: N/A;  . POLYPECTOMY N/A 11/06/2013   Procedure: POLYPECTOMY (REMOVAL ENDOMETRIAL POLYP);  Surgeon: Jonnie Kind, MD;  Location: [facility];  Service: Gynecology;  Laterality: N/A;  . RECTOCELE REPAIR N/A 11/06/2013   Procedure: POSTERIOR REPAIR (RECTOCELE);  Surgeon: Jonnie Kind, MD;  Location: [facility];  Service: Gynecology;  Laterality: N/A;  . SHOULDER ARTHROSCOPY WITH OPEN ROTATOR CUFF REPAIR Left 07/26/2012  . TONSILLECTOMY  1949  . TUBAL LIGATION      Family History  Problem Relation Age of Onset  . Hypertension Mother   . Heart disease Mother   . Diabetes Mother   . Kidney disease Mother        ESRD  . Cancer Mother        Bladder  . Hypertension  Father   . Hyperlipidemia Father   . Heart disease Father   . Diabetes Father   . Cancer Father        Stomach Cancer  . Diabetes Sister   . Heart disease Sister   . Alzheimer's disease Paternal Aunt   . Stroke Maternal Grandfather   . Cancer Paternal Grandfather        stomach  . Cancer Maternal Uncle        prostate  . Cancer Paternal Uncle        stomach    Current Outpatient Medications on File Prior to Visit  Medication Sig Dispense Refill  . acetaminophen (TYLENOL) 500 MG tablet Take 500 mg by mouth as needed for moderate pain. Takes 2 when needed    . Aloe Vera Leaf POWD Take 800 mg by mouth 2 (two) times daily.     . Ascorbic Acid (VITAMIN C) 1000 MG tablet Take 1,000 mg by mouth 2 (two) times  daily.    Marland Kitchen b complex vitamins tablet Take 1 tablet by mouth 2 (two) times daily.     . Blood Glucose Monitoring Suppl (BLOOD GLUCOSE SYSTEM PAK) KIT Use as directed to monitor FSBS 1x daily. Dx: E11.9. Please dispense as Accu-Chek Aviva 1 each 1  . Cholecalciferol (VITAMIN D3) 2000 UNITS TABS Take 2,000 Units by mouth 2 (two) times daily.    . Echinacea 380 MG CAPS Take 1 capsule by mouth as needed (cold/flue).    . fluocinonide cream (LIDEX) 2.56 % Apply 1 application topically 2 (two) times daily as needed (poison ivy/bites/hives). 30 g 11  . Ginkgo Biloba 120 MG CAPS Take 1 capsule by mouth daily.     Marland Kitchen glipiZIDE (GLUCOTROL) 10 MG tablet TAKE 1/2 (ONE-HALF) TABLET BY MOUTH TWICE DAILY BEFORE A MEAL FOR DIABETES 180 tablet 0  . Glucose Blood (BLOOD GLUCOSE TEST STRIPS) STRP Use as directed to monitor FSBS 1x daily. Dx: E11.9. Please dispense as Accu-Chek Aviva 100 each 1  . KRILL OIL PO Take 1 capsule by mouth 2 (two) times daily.    . Lancets MISC Use as directed to monitor FSBS 1x daily. Dx: E11.9. Please dispense as Accu-Chek Aviva 100 each 1  . LORazepam (ATIVAN) 0.5 MG tablet Take 1 tablet (0.5 mg total) by mouth 2 (two) times daily as needed for anxiety. 30 tablet 1  . Melatonin 3 MG TABS Take 3 mg by mouth at bedtime as needed and may repeat dose one time if needed (sleep).     . metFORMIN (GLUCOPHAGE) 1000 MG tablet TAKE 1 TABLET BY MOUTH TWICE DAILY WITH A MEAL FOR DIABETES 180 tablet 0  . metoprolol succinate (TOPROL-XL) 25 MG 24 hr tablet Take 0.5 tablets (12.5 mg total) by mouth daily. 45 tablet 3  . Multiple Vitamin (MULTIVITAMIN WITH MINERALS) TABS tablet Take 1 tablet by mouth daily.    . naftifine (NAFTIN) 1 % cream Apply topically 2 (two) times daily as needed. 60 g 2  . NONFORMULARY OR COMPOUNDED ITEM Apply 1 application topically 2 (two) times daily as needed. Reported on 06/10/2015    . rosuvastatin (CRESTOR) 5 MG tablet TAKE 1 TABLET BY MOUTH ONCE DAILY FOR CHOLESTEROL 90  tablet 0  . simethicone (MYLICON) 80 MG chewable tablet Chew 80 mg by mouth as needed for flatulence.     Marland Kitchen tiZANidine (ZANAFLEX) 4 MG tablet TAKE 1/2 (ONE-HALF) TABLET BY MOUTH EVERY 6 HOURS AS NEEDED FOR MUSCLE SPASM 30 tablet 2  . torsemide (  DEMADEX) 20 MG tablet Take 20 mg by mouth daily.     . TRULICITY 2.11 HE/1.7EY SOPN INJECT 1 SYRINGE SUBCUTANEOUSLY ONCE A WEEK 0.5 mL 2   No current facility-administered medications on file prior to visit.    Allergies  Allergen Reactions  . Arimidex [Anastrozole] Other (See Comments)    Arthralgias and myalgias.  Improved with 3 week hiatus from drug.  Categorical side effect of drug class.  . Adhesive [Tape] Other (See Comments)    "red and blistered"  . Lisinopril     Dry hacking cough and tickle  . Tetanus Toxoids   . Tetracyclines & Related Other (See Comments)    unknown    Social History   Substance and Sexual Activity  Alcohol Use Yes  . Alcohol/week: 0.0 standard drinks   Comment: 06/22/2017 "might have a drink twice a year"    Social History   Tobacco Use  Smoking Status Never Smoker  Smokeless Tobacco Never Used    Review of Systems  Constitutional: Positive for malaise/fatigue.  HENT: Negative.   Eyes: Negative.   Respiratory: Positive for shortness of breath.   Cardiovascular: Negative.   Gastrointestinal: Negative.   Genitourinary: Negative.   Musculoskeletal: Positive for back pain and joint pain.  Skin: Negative.   Neurological: Negative.   Endo/Heme/Allergies: Negative.   Psychiatric/Behavioral: Negative.     Objective   Vitals:   12/11/19 1333  BP: (!) 144/84  Pulse: 70  Resp: 12  Temp: 98.1 F (36.7 C)  SpO2: 95%    Physical Exam Vitals reviewed.  Constitutional:      Appearance: Normal appearance. She is obese. She is not ill-appearing.  HENT:     Head: Normocephalic and atraumatic.  Cardiovascular:     Rate and Rhythm: Normal rate and regular rhythm.     Heart sounds: No murmur heard.   No friction rub. No gallop.      Comments: Pacemaker in place right upper chest Pulmonary:     Effort: Pulmonary effort is normal. No respiratory distress.     Breath sounds: Normal breath sounds. No stridor. No wheezing, rhonchi or rales.  Lymphadenopathy:     Cervical: No cervical adenopathy.  Skin:    General: Skin is warm and dry.  Neurological:     Mental Status: She is alert and oriented to person, place, and time.   Breast: Well-healed surgical scars in the left breast.  No dominant mass, nipple discharge, dimpling noted.  Right breast examination reveals no dominant mass, nipple discharge, or dimpling.  The right axilla is negative for palpable nodes. Extremity examination does reveal left arm diffuse swelling.  Mammogram and pathology reports reviewed.  A 0.6 cm mass is noted at the 6 o'clock position in the right breast.  It is biopsy positive for infiltrating ductal carcinoma.  Assessment  Infiltrating ductal carcinoma right breast Plan   Patient would like to only have a lumpectomy.  I did tell her that ideally radiation therapy is indicated afterwards, but given her age, this recommendation has been decreased and not always recommended.  She is also nervous about having any axillary lymph node biopsy given her left arm lymphedema.  I told her I would discuss this with Dr. Delton Coombes of oncology and will notify her of our final recommendations. Addendum:  Will only do right partial mastectomy after needle localization, no sentinel lymph node biopsy.

## 2019-12-28 ENCOUNTER — Encounter (HOSPITAL_COMMUNITY)
Admission: RE | Admit: 2019-12-28 | Discharge: 2019-12-28 | Disposition: A | Discharge status: Home / Self Care | Payer: Medicare Other | Source: Ambulatory Visit | Attending: General Surgery | Admitting: General Surgery

## 2019-12-28 ENCOUNTER — Other Ambulatory Visit: Payer: Self-pay

## 2019-12-31 ENCOUNTER — Other Ambulatory Visit (HOSPITAL_COMMUNITY)
Admission: RE | Admit: 2019-12-31 | Discharge: 2019-12-31 | Disposition: A | Discharge status: Home / Self Care | Payer: Medicare Other | Source: Ambulatory Visit | Attending: General Surgery | Admitting: General Surgery

## 2019-12-31 ENCOUNTER — Other Ambulatory Visit: Payer: Self-pay

## 2019-12-31 DIAGNOSIS — Z01812 Encounter for preprocedural laboratory examination: Secondary | ICD-10-CM | POA: Diagnosis not present

## 2019-12-31 DIAGNOSIS — Z20822 Contact with and (suspected) exposure to covid-19: Secondary | ICD-10-CM | POA: Diagnosis not present

## 2020-01-01 LAB — SARS CORONAVIRUS 2 (TAT 6-24 HRS): SARS Coronavirus 2: NEGATIVE

## 2020-01-02 ENCOUNTER — Encounter (HOSPITAL_COMMUNITY)
Admission: RE | Disposition: A | Discharge status: Home / Self Care | Payer: Self-pay | Source: Home / Self Care | Attending: General Surgery

## 2020-01-02 ENCOUNTER — Ambulatory Visit (HOSPITAL_COMMUNITY): Payer: Medicare Other

## 2020-01-02 ENCOUNTER — Ambulatory Visit (HOSPITAL_COMMUNITY): Payer: Medicare Other | Admitting: Anesthesiology

## 2020-01-02 ENCOUNTER — Ambulatory Visit (HOSPITAL_COMMUNITY)
Admission: RE | Admit: 2020-01-02 | Discharge: 2020-01-02 | Disposition: A | Discharge status: Home / Self Care | Payer: Medicare Other | Source: Ambulatory Visit | Attending: General Surgery | Admitting: General Surgery

## 2020-01-02 ENCOUNTER — Ambulatory Visit (HOSPITAL_COMMUNITY)
Admission: RE | Admit: 2020-01-02 | Discharge: 2020-01-02 | Disposition: A | Discharge status: Home / Self Care | Payer: Medicare Other | Attending: General Surgery | Admitting: General Surgery

## 2020-01-02 ENCOUNTER — Other Ambulatory Visit: Payer: Self-pay

## 2020-01-02 DIAGNOSIS — E785 Hyperlipidemia, unspecified: Secondary | ICD-10-CM | POA: Insufficient documentation

## 2020-01-02 DIAGNOSIS — Z950 Presence of cardiac pacemaker: Secondary | ICD-10-CM | POA: Diagnosis not present

## 2020-01-02 DIAGNOSIS — Z8249 Family history of ischemic heart disease and other diseases of the circulatory system: Secondary | ICD-10-CM | POA: Diagnosis not present

## 2020-01-02 DIAGNOSIS — I700 Atherosclerosis of aorta: Secondary | ICD-10-CM | POA: Insufficient documentation

## 2020-01-02 DIAGNOSIS — I441 Atrioventricular block, second degree: Secondary | ICD-10-CM | POA: Insufficient documentation

## 2020-01-02 DIAGNOSIS — Z170 Estrogen receptor positive status [ER+]: Secondary | ICD-10-CM | POA: Diagnosis not present

## 2020-01-02 DIAGNOSIS — Z881 Allergy status to other antibiotic agents status: Secondary | ICD-10-CM | POA: Insufficient documentation

## 2020-01-02 DIAGNOSIS — Z887 Allergy status to serum and vaccine status: Secondary | ICD-10-CM | POA: Insufficient documentation

## 2020-01-02 DIAGNOSIS — Z823 Family history of stroke: Secondary | ICD-10-CM | POA: Insufficient documentation

## 2020-01-02 DIAGNOSIS — Z01818 Encounter for other preprocedural examination: Secondary | ICD-10-CM | POA: Diagnosis not present

## 2020-01-02 DIAGNOSIS — G4730 Sleep apnea, unspecified: Secondary | ICD-10-CM | POA: Insufficient documentation

## 2020-01-02 DIAGNOSIS — E119 Type 2 diabetes mellitus without complications: Secondary | ICD-10-CM | POA: Diagnosis not present

## 2020-01-02 DIAGNOSIS — Z79899 Other long term (current) drug therapy: Secondary | ICD-10-CM | POA: Insufficient documentation

## 2020-01-02 DIAGNOSIS — Z86010 Personal history of colonic polyps: Secondary | ICD-10-CM | POA: Insufficient documentation

## 2020-01-02 DIAGNOSIS — Z9842 Cataract extraction status, left eye: Secondary | ICD-10-CM | POA: Insufficient documentation

## 2020-01-02 DIAGNOSIS — Z8349 Family history of other endocrine, nutritional and metabolic diseases: Secondary | ICD-10-CM | POA: Insufficient documentation

## 2020-01-02 DIAGNOSIS — Z833 Family history of diabetes mellitus: Secondary | ICD-10-CM | POA: Insufficient documentation

## 2020-01-02 DIAGNOSIS — J449 Chronic obstructive pulmonary disease, unspecified: Secondary | ICD-10-CM | POA: Insufficient documentation

## 2020-01-02 DIAGNOSIS — Z853 Personal history of malignant neoplasm of breast: Secondary | ICD-10-CM | POA: Insufficient documentation

## 2020-01-02 DIAGNOSIS — Z800 Family history of malignant neoplasm of digestive organs: Secondary | ICD-10-CM | POA: Insufficient documentation

## 2020-01-02 DIAGNOSIS — K219 Gastro-esophageal reflux disease without esophagitis: Secondary | ICD-10-CM | POA: Diagnosis not present

## 2020-01-02 DIAGNOSIS — I10 Essential (primary) hypertension: Secondary | ICD-10-CM | POA: Insufficient documentation

## 2020-01-02 DIAGNOSIS — R928 Other abnormal and inconclusive findings on diagnostic imaging of breast: Secondary | ICD-10-CM

## 2020-01-02 DIAGNOSIS — M159 Polyosteoarthritis, unspecified: Secondary | ICD-10-CM | POA: Insufficient documentation

## 2020-01-02 DIAGNOSIS — C50911 Malignant neoplasm of unspecified site of right female breast: Secondary | ICD-10-CM | POA: Insufficient documentation

## 2020-01-02 DIAGNOSIS — Z91048 Other nonmedicinal substance allergy status: Secondary | ICD-10-CM | POA: Insufficient documentation

## 2020-01-02 DIAGNOSIS — I890 Lymphedema, not elsewhere classified: Secondary | ICD-10-CM | POA: Diagnosis not present

## 2020-01-02 DIAGNOSIS — R0602 Shortness of breath: Secondary | ICD-10-CM | POA: Insufficient documentation

## 2020-01-02 DIAGNOSIS — G8929 Other chronic pain: Secondary | ICD-10-CM | POA: Diagnosis not present

## 2020-01-02 DIAGNOSIS — K760 Fatty (change of) liver, not elsewhere classified: Secondary | ICD-10-CM | POA: Insufficient documentation

## 2020-01-02 DIAGNOSIS — C50111 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: Secondary | ICD-10-CM | POA: Diagnosis not present

## 2020-01-02 DIAGNOSIS — Z8042 Family history of malignant neoplasm of prostate: Secondary | ICD-10-CM | POA: Insufficient documentation

## 2020-01-02 DIAGNOSIS — Z841 Family history of disorders of kidney and ureter: Secondary | ICD-10-CM | POA: Insufficient documentation

## 2020-01-02 DIAGNOSIS — Z961 Presence of intraocular lens: Secondary | ICD-10-CM | POA: Insufficient documentation

## 2020-01-02 DIAGNOSIS — Z888 Allergy status to other drugs, medicaments and biological substances status: Secondary | ICD-10-CM | POA: Insufficient documentation

## 2020-01-02 DIAGNOSIS — C3490 Malignant neoplasm of unspecified part of unspecified bronchus or lung: Secondary | ICD-10-CM | POA: Diagnosis not present

## 2020-01-02 DIAGNOSIS — Z820 Family history of epilepsy and other diseases of the nervous system: Secondary | ICD-10-CM | POA: Insufficient documentation

## 2020-01-02 DIAGNOSIS — Z7984 Long term (current) use of oral hypoglycemic drugs: Secondary | ICD-10-CM | POA: Insufficient documentation

## 2020-01-02 DIAGNOSIS — Z9841 Cataract extraction status, right eye: Secondary | ICD-10-CM | POA: Diagnosis not present

## 2020-01-02 HISTORY — PX: PARTIAL MASTECTOMY WITH NEEDLE LOCALIZATION: SHX6008

## 2020-01-02 LAB — GLUCOSE, CAPILLARY: Glucose-Capillary: 111 mg/dL — ABNORMAL HIGH (ref 70–99)

## 2020-01-02 SURGERY — PARTIAL MASTECTOMY WITH NEEDLE LOCALIZATION
Anesthesia: General | Site: Breast | Laterality: Right

## 2020-01-02 MED ORDER — HYDROMORPHONE HCL 1 MG/ML IJ SOLN
0.2500 mg | INTRAMUSCULAR | Status: DC | PRN
  Administered 2020-01-02: 0.5 mg via INTRAVENOUS

## 2020-01-02 MED ORDER — ONDANSETRON HCL 4 MG/2ML IJ SOLN
INTRAMUSCULAR | Status: DC | PRN
  Administered 2020-01-02: 4 mg via INTRAVENOUS

## 2020-01-02 MED ORDER — ORAL CARE MOUTH RINSE: 15.0000 mL | Freq: Once | OROMUCOSAL | Status: AC

## 2020-01-02 MED ORDER — LACTATED RINGERS IV SOLN: INTRAVENOUS | Status: DC | PRN

## 2020-01-02 MED ORDER — HYDROMORPHONE HCL 1 MG/ML IJ SOLN
INTRAMUSCULAR | Status: AC
  Filled 2020-01-02: qty 0.5

## 2020-01-02 MED ORDER — BUPIVACAINE HCL (PF) 0.5 % IJ SOLN
INTRAMUSCULAR | Status: DC | PRN
  Administered 2020-01-02: 10 mL

## 2020-01-02 MED ORDER — FENTANYL CITRATE (PF) 100 MCG/2ML IJ SOLN
INTRAMUSCULAR | Status: AC
  Filled 2020-01-02: qty 2

## 2020-01-02 MED ORDER — ONDANSETRON HCL 4 MG/2ML IJ SOLN
INTRAMUSCULAR | Status: AC
  Filled 2020-01-02: qty 2

## 2020-01-02 MED ORDER — HYDROCODONE-ACETAMINOPHEN 5-325 MG PO TABS
1.0000 | ORAL_TABLET | Freq: Four times a day (QID) | ORAL | 0 refills | Status: DC | PRN
Start: 2020-01-02 — End: 2020-01-10

## 2020-01-02 MED ORDER — METHYLENE BLUE 0.5 % INJ SOLN
INTRAVENOUS | Status: AC
  Filled 2020-01-02: qty 10

## 2020-01-02 MED ORDER — PHENYLEPHRINE HCL (PRESSORS) 10 MG/ML IV SOLN
INTRAVENOUS | Status: DC | PRN
  Administered 2020-01-02 (×2): 80 ug via INTRAVENOUS

## 2020-01-02 MED ORDER — DEXAMETHASONE SODIUM PHOSPHATE 10 MG/ML IJ SOLN
INTRAMUSCULAR | Status: DC | PRN
  Administered 2020-01-02: 5 mg via INTRAVENOUS

## 2020-01-02 MED ORDER — CHLORHEXIDINE GLUCONATE 0.12 % MT SOLN
15.0000 mL | Freq: Once | OROMUCOSAL | Status: AC
  Administered 2020-01-02: 15 mL via OROMUCOSAL
  Filled 2020-01-02: qty 15

## 2020-01-02 MED ORDER — LIDOCAINE HCL (CARDIAC) PF 50 MG/5ML IV SOSY
PREFILLED_SYRINGE | INTRAVENOUS | Status: DC | PRN
  Administered 2020-01-02: 100 mg via INTRAVENOUS

## 2020-01-02 MED ORDER — CHLORHEXIDINE GLUCONATE CLOTH 2 % EX PADS: 6.0000 | MEDICATED_PAD | Freq: Once | CUTANEOUS | Status: DC

## 2020-01-02 MED ORDER — PROPOFOL 10 MG/ML IV BOLUS
INTRAVENOUS | Status: AC
  Filled 2020-01-02: qty 40

## 2020-01-02 MED ORDER — PHENYLEPHRINE 40 MCG/ML (10ML) SYRINGE FOR IV PUSH (FOR BLOOD PRESSURE SUPPORT)
PREFILLED_SYRINGE | INTRAVENOUS | Status: AC
  Filled 2020-01-02: qty 10

## 2020-01-02 MED ORDER — ONDANSETRON HCL 4 MG/2ML IJ SOLN: 4.0000 mg | Freq: Once | INTRAMUSCULAR | Status: DC | PRN

## 2020-01-02 MED ORDER — LACTATED RINGERS IV SOLN: Freq: Once | INTRAVENOUS | Status: AC

## 2020-01-02 MED ORDER — LIDOCAINE HCL (PF) 2 % IJ SOLN
INTRAMUSCULAR | Status: AC
  Filled 2020-01-02: qty 10

## 2020-01-02 MED ORDER — LIDOCAINE 2% (20 MG/ML) 5 ML SYRINGE
INTRAMUSCULAR | Status: AC
  Filled 2020-01-02: qty 5

## 2020-01-02 MED ORDER — DEXAMETHASONE SODIUM PHOSPHATE 10 MG/ML IJ SOLN
INTRAMUSCULAR | Status: AC
  Filled 2020-01-02: qty 1

## 2020-01-02 MED ORDER — CEFAZOLIN SODIUM-DEXTROSE 2-4 GM/100ML-% IV SOLN
2.0000 g | INTRAVENOUS | Status: AC
  Administered 2020-01-02: 2 g via INTRAVENOUS
  Filled 2020-01-02: qty 100

## 2020-01-02 MED ORDER — PROPOFOL 10 MG/ML IV BOLUS
INTRAVENOUS | Status: DC | PRN
  Administered 2020-01-02: 200 mg via INTRAVENOUS

## 2020-01-02 MED ORDER — BUPIVACAINE HCL (PF) 0.5 % IJ SOLN
INTRAMUSCULAR | Status: AC
  Filled 2020-01-02: qty 30

## 2020-01-02 MED ORDER — MEPERIDINE HCL 50 MG/ML IJ SOLN: 6.2500 mg | INTRAMUSCULAR | Status: DC | PRN

## 2020-01-02 MED ORDER — FENTANYL CITRATE (PF) 100 MCG/2ML IJ SOLN
INTRAMUSCULAR | Status: DC | PRN
Start: 2020-01-02 — End: 2020-01-02
  Administered 2020-01-02 (×2): 50 ug via INTRAVENOUS

## 2020-01-02 MED ORDER — 0.9 % SODIUM CHLORIDE (POUR BTL) OPTIME
TOPICAL | Status: DC | PRN
  Administered 2020-01-02: 1000 mL

## 2020-01-02 SURGICAL SUPPLY — 34 items
ADH SKN CLS APL DERMABOND .7 (GAUZE/BANDAGES/DRESSINGS) ×1
CLOTH BEACON ORANGE TIMEOUT ST (SAFETY) ×3 IMPLANT
COVER LIGHT HANDLE STERIS (MISCELLANEOUS) ×6 IMPLANT
COVER WAND RF STERILE (DRAPES) ×3 IMPLANT
DECANTER SPIKE VIAL GLASS SM (MISCELLANEOUS) ×3 IMPLANT
DERMABOND ADVANCED (GAUZE/BANDAGES/DRESSINGS) ×2
DERMABOND ADVANCED .7 DNX12 (GAUZE/BANDAGES/DRESSINGS) ×1 IMPLANT
DURAPREP 26ML APPLICATOR (WOUND CARE) ×3 IMPLANT
ELECT REM PT RETURN 9FT ADLT (ELECTROSURGICAL) ×3
ELECTRODE REM PT RTRN 9FT ADLT (ELECTROSURGICAL) ×1 IMPLANT
GLOVE BIOGEL M 7.0 STRL (GLOVE) ×3 IMPLANT
GLOVE BIOGEL PI IND STRL 7.0 (GLOVE) ×5 IMPLANT
GLOVE BIOGEL PI IND STRL 7.5 (GLOVE) ×3 IMPLANT
GLOVE BIOGEL PI INDICATOR 7.0 (GLOVE) ×10
GLOVE BIOGEL PI INDICATOR 7.5 (GLOVE) ×6
GLOVE SS BIOGEL STRL SZ 7.5 (GLOVE) ×1 IMPLANT
GLOVE SUPERSENSE BIOGEL SZ 7.5 (GLOVE) ×2
GLOVE SURG SS PI 7.5 STRL IVOR (GLOVE) ×3 IMPLANT
GOWN STRL REUS W/TWL LRG LVL3 (GOWN DISPOSABLE) ×6 IMPLANT
KIT TURNOVER KIT A (KITS) ×3 IMPLANT
MANIFOLD NEPTUNE II (INSTRUMENTS) ×3 IMPLANT
NEEDLE HYPO 25X1 1.5 SAFETY (NEEDLE) ×3 IMPLANT
NS IRRIG 1000ML POUR BTL (IV SOLUTION) ×3 IMPLANT
PACK MINOR (CUSTOM PROCEDURE TRAY) ×3 IMPLANT
PAD ARMBOARD 7.5X6 YLW CONV (MISCELLANEOUS) ×3 IMPLANT
PENCIL SMOKE EVACUATOR (MISCELLANEOUS) ×3 IMPLANT
SET BASIN LINEN APH (SET/KITS/TRAYS/PACK) ×3 IMPLANT
SPONGE LAP 18X18 RF (DISPOSABLE) ×3 IMPLANT
SUT MNCRL AB 4-0 PS2 18 (SUTURE) ×3 IMPLANT
SUT SILK 3 0 (SUTURE) ×3
SUT SILK 3-0 FS1 18XBRD (SUTURE) ×1 IMPLANT
SUT VIC AB 3-0 SH 27 (SUTURE) ×3
SUT VIC AB 3-0 SH 27X BRD (SUTURE) ×1 IMPLANT
SYR CONTROL 10ML LL (SYRINGE) ×3 IMPLANT

## 2020-01-02 NOTE — Anesthesia Preprocedure Evaluation (Addendum)
Anesthesia Evaluation  Patient identified by MRN, date of birth, ID band Patient awake    Reviewed: Allergy & Precautions, NPO status , Patient's Chart, lab work & pertinent test results, reviewed documented beta blocker date and time   History of Anesthesia Complications Negative for: history of anesthetic complications  Airway Mallampati: III  TM Distance: >3 FB Neck ROM: Full    Dental  (+) Dental Advisory Given   Pulmonary shortness of breath, asthma , sleep apnea , COPD,    Pulmonary exam normal breath sounds clear to auscultation       Cardiovascular METS: 3 - Mets hypertension, Pt. on medications and Pt. on home beta blockers + dysrhythmias (mobitz 2 block) + pacemaker + Valvular Problems/Murmurs  Rhythm:Regular - Systolic murmurs, - Diastolic murmurs, - Friction Rub, - Carotid Bruit, - Peripheral Edema and - Systolic Click Pacemaker mode 90 beats/minute Left ventricle: The cavity size was normal. Wall thickness was  increased in a pattern of mild LVH. Systolic function was normal.  The estimated ejection fraction was in the range of 60% to 65%.  Wall motion was normal; there were no regional wall motion  abnormalities. Doppler parameters are consistent with abnormal  left ventricular relaxation (grade 1 diastolic dysfunction).  - Aortic valve: Mildly calcified annulus. Trileaflet; mildly  calcified leaflets. Valve area (VTI): 2.28 cm^2.  - Mitral valve: Mildly calcified annulus. There was trivial  regurgitation.  - Right atrium: Central venous pressure (est): 3 mm Hg.  - Atrial septum: No defect or patent foramen ovale was identified.  - Tricuspid valve: There was trivial regurgitation.  - Pulmonary arteries: Systolic pressure could not be accurately  estimated.  - Pericardium, extracardiac: There was no pericardial effusion.    Neuro/Psych negative neurological ROS  negative psych ROS    GI/Hepatic Neg liver ROS, GERD  Medicated,  Endo/Other  diabetes, Well Controlled, Type 2, Oral Hypoglycemic Agents  Renal/GU Renal InsufficiencyRenal disease     Musculoskeletal  (+) Arthritis , Chronic pain    Abdominal   Peds  Hematology   Anesthesia Other Findings Pacemaker was evaluated by rep preop, changed to DOO mode with HR 90. Rep will come back after the procedure and reprogram to her dual chamber mode.  Reproductive/Obstetrics negative OB ROS                            Anesthesia Physical Anesthesia Plan  ASA: IV  Anesthesia Plan: General   Post-op Pain Management:    Induction: Intravenous  PONV Risk Score and Plan: 4 or greater and Ondansetron  Airway Management Planned: LMA  Additional Equipment:   Intra-op Plan:   Post-operative Plan: Extubation in OR  Informed Consent: I have reviewed the patients History and Physical, chart, labs and discussed the procedure including the risks, benefits and alternatives for the proposed anesthesia with the patient or authorized representative who has indicated his/her understanding and acceptance.     Dental advisory given  Plan Discussed with: CRNA and Surgeon  Anesthesia Plan Comments:         Anesthesia Quick Evaluation

## 2020-01-02 NOTE — Progress Notes (Signed)
Talked with Mickel Baas from [facility]. Will be arriving in approx. 25 min. to turn on pace maker.

## 2020-01-02 NOTE — OR Nursing (Signed)
Dr. Arnoldo Morale in to see patient and mark site.

## 2020-01-02 NOTE — Op Note (Signed)
Patient:  Julie Clay  DOB:  [birth year]  MRN:  [id number]   Preop Diagnosis: Right breast cancer  Postop Diagnosis: Same  Procedure: Right partial mastectomy after needle localization  Surgeon: Aviva Signs, MD  Anes: General  Indications: Patient is a 79 year old white female who was recently diagnosed with a small right breast cancer.  She also has a pacemaker on the same side and this was placed in asynchronous mode prior to surgical intervention.  The risks and benefits of the procedure including bleeding, infection, and the possibility of incomplete margins were fully explained to the patient, who gave informed consent.  Procedure note: The patient was placed in the supine position after undergoing needle localization in the radiology department.  After general anesthesia was administered, the right breast was prepped and draped using usual sterile technique with ChloraPrep.  Surgical site confirmation was performed.  The needle was located at approximately the 9 o'clock position in the periareolar region.  An incision was made along the border of the areola.  The dissection was taken down to the area of concern.  A wide resection was performed.  A short suture was placed superiorly and a long suture placed laterally for orientation purposes.  The specimen was sent to radiology and specimen radiography revealed the clip to be within the specimen removed.  And bleeding was controlled using Bovie electrocautery.  The wound was irrigated with normal saline.  The subcutaneous layer was reapproximated using a 3-0 Vicryl interrupted suture.  The skin was closed using a 4-0 Monocryl subcuticular suture.  0.5% Sensorcaine was instilled into the surrounding wound.  Dermabond was applied.  All tape and needle counts were correct at the end of the procedure.  The patient was awakened and transferred to PACU in stable condition.  Complications: None  EBL: Minimal  Specimen: Right breast  tissue

## 2020-01-02 NOTE — OR Nursing (Signed)
Mammo notified that patient is ready to come over for needle loc. She will call us back when they are ready for the patient.

## 2020-01-02 NOTE — Anesthesia Postprocedure Evaluation (Signed)
Anesthesia Post Note  Patient: Julie Clay  Procedure(s) Performed: RIGHT PARTIAL MASTECTOMY AFTER NEEDLE LOCALIZATION (Right Breast)  Patient location during evaluation: PACU Anesthesia Type: General Level of consciousness: awake, oriented, awake and alert and patient cooperative Pain management: pain level controlled Vital Signs Assessment: post-procedure vital signs reviewed and stable Respiratory status: spontaneous breathing, respiratory function stable and nonlabored ventilation Cardiovascular status: blood pressure returned to baseline and stable Postop Assessment: no headache and no backache Anesthetic complications: no   No complications documented.   Last Vitals:  Vitals:   01/02/20 0757 01/02/20 0803  BP: (!) 155/70 (!) 155/70  Pulse: 90 90  Resp: 15 20  Temp: 36.6 C 36.6 C  SpO2: 98% 98%    Last Pain:  Vitals:   01/02/20 0803  TempSrc: Oral  PainSc: 0-No pain                 Tacy Learn

## 2020-01-02 NOTE — Progress Notes (Signed)
Julie Clay from [place] at bedside to [employer]. Cleared for d/c home.

## 2020-01-02 NOTE — Interval H&P Note (Signed)
History and Physical Interval Note:  01/02/2020 8:24 AM  Julie Clay  has presented today for surgery, with the diagnosis of right breast cancer.  The various methods of treatment have been discussed with the patient and family. After consideration of risks, benefits and other options for treatment, the patient has consented to  Procedure(s) with comments: PARTIAL MASTECTOMY WITH NEEDLE LOCALIZATION (Right) - needle loc @8 :00 as a surgical intervention.  The patient's history has been reviewed, patient examined, no change in status, stable for surgery.  I have reviewed the patient's chart and labs.  Questions were answered to the patient's satisfaction.     Aviva Signs

## 2020-01-02 NOTE — Discharge Instructions (Signed)
Lumpectomy, Care After This sheet gives you information about how to care for yourself after your procedure. Your health care provider may also give you more specific instructions. If you have problems or questions, contact your health care provider. What can I expect after the procedure? After the procedure, it is common to have:  Breast swelling.  Breast tenderness.  Stiffness in your arm or shoulder.  A change in the shape and feel of your breast.  Scar tissue that feels hard to the touch in the area where the lump was removed. Follow these instructions at home: Medicines  Take over-the-counter and prescription medicines only as told by your health care provider.  If you were prescribed an antibiotic medicine, take it as told by your health care provider. Do not stop taking the antibiotic even if you start to feel better.  Ask your health care provider if the medicine prescribed to you: ? Requires you to avoid driving or using heavy machinery. ? Can cause constipation. You may need to take these actions to prevent or treat constipation:  Drink enough fluid to keep your urine pale yellow.  Take over-the-counter or prescription medicines.  Eat foods that are high in fiber, such as beans, whole grains, and fresh fruits and vegetables.  Limit foods that are high in fat and processed sugars, such as fried or sweet foods. Incision care      Follow instructions from your health care provider about how to take care of your incision. Make sure you: ? Wash your hands with soap and water before and after you change your bandage (dressing). If soap and water are not available, use hand sanitizer. ? Change your dressing as told by your health care provider. ? Leave stitches (sutures), skin glue, or adhesive strips in place. These skin closures may need to stay in place for 2 weeks or longer. If adhesive strip edges start to loosen and curl up, you may trim the loose edges. Do not remove  adhesive strips completely unless your health care provider tells you to do that.  Check your incision area every day for signs of infection. Check for: ? More redness, swelling, or pain. ? Fluid or blood. ? Warmth. ? Pus or a bad smell.  Keep your dressing clean and dry.  If you were sent home with a surgical drain in place, follow instructions from your health care provider about emptying it. Bathing  Do not take baths, swim, or use a hot tub until your health care provider approves.  Ask your health care provider if you may take showers. You may only be allowed to take sponge baths. Activity  Rest as told by your health care provider.  Avoid sitting for a long time without moving. Get up to take short walks every 1-2 hours. This is important to improve blood flow and breathing. Ask for help if you feel weak or unsteady.  Return to your normal activities as told by your health care provider. Ask your health care provider what activities are safe for you.  Be careful to avoid any activities that could cause an injury to your arm on the side of your surgery.  Do not lift anything that is heavier than 10 lb (4.5 kg), or the limit that you are told, until your health care provider says that it is safe. Avoid lifting with the arm that is on the side of your surgery.  Do not carry heavy objects on your shoulder on the side of your  surgery.  Do exercises to keep your shoulder and arm from getting stiff and swollen. Talk with your health care provider about which exercises are safe for you. General instructions  Wear a supportive bra as told by your health care provider.  Raise (elevate) your arm above the level of your heart while you are sitting or lying down.  Do not wear tight jewelry on your arm, wrist, or fingers on the side of your surgery.  Keep all follow-up visits as told by your health care provider. This is important. ? You may need to be screened for extra fluid around  the lymph nodes and swelling in the breast and arm (lymphedema). Follow instructions from your health care provider about how often you should be checked.  If you had any lymph nodes removed during your procedure, be sure to tell all of your health care providers. This is important information to share before you are involved in certain procedures, such as having blood tests or having your blood pressure taken. Contact a health care provider if:  You develop a rash.  You have a fever.  Your pain medicine is not working.  You have swelling, weakness, or numbness in your arm that does not improve after a few weeks.  You have new swelling in your breast.  You have any of these signs of infection: ? More redness, swelling, or pain in your incision area. ? Fluid or blood coming from your incision. ? Warmth coming from the incision area. ? Pus or a bad smell coming from your incision. Get help right away if you have:  Very bad pain in your breast or arm.  Swelling in your legs or arms.  Redness, warmth, or pain in your leg or arm.  Chest pain.  Difficulty breathing. Summary  After the procedure, it is common to have breast tenderness, swelling in your breast, and stiffness in your arm and shoulder.  Follow instructions from your health care provider about how to take care of your incision.  Do not lift anything that is heavier than 10 lb (4.5 kg), or the limit that you are told, until your health care provider says that it is safe. Avoid lifting with the arm that is on the side of your surgery.  If you had any lymph nodes removed during your procedure, be sure to tell all of your health care providers. This is important information to share before you are involved in certain procedures, such as having blood tests or having your blood pressure taken. This information is not intended to replace advice given to you by your health care provider. Make sure you discuss any questions you  have with your health care provider. Document Revised: 10/09/2018 Document Reviewed: 10/09/2018 Elsevier Patient Education  [place] Anesthesia, Adult, Care After This sheet gives you information about how to care for yourself after your procedure. Your health care provider may also give you more specific instructions. If you have problems or questions, contact your health care provider. What can I expect after the procedure? After the procedure, the following side effects are common:  Pain or discomfort at the IV site.  Nausea.  Vomiting.  Sore throat.  Trouble concentrating.  Feeling cold or chills.  Weak or tired.  Sleepiness and fatigue.  Soreness and body aches. These side effects can affect parts of the body that were not involved in surgery. Follow these instructions at home:  For at least 24 hours  after the procedure:  Have a responsible adult stay with you. It is important to have someone help care for you until you are awake and alert.  Rest as needed.  Do not: ? Participate in activities in which you could fall or become injured. ? Drive. ? Use heavy machinery. ? Drink alcohol. ? Take sleeping pills or medicines that cause drowsiness. ? Make important decisions or sign legal documents. ? Take care of children on your own. Eating and drinking  Follow any instructions from your health care provider about eating or drinking restrictions.  When you feel hungry, start by eating small amounts of foods that are soft and easy to digest (bland), such as toast. Gradually return to your regular diet.  Drink enough fluid to keep your urine pale yellow.  If you vomit, rehydrate by drinking water, juice, or clear broth. General instructions  If you have sleep apnea, surgery and certain medicines can increase your risk for breathing problems. Follow instructions from your health care provider about wearing your sleep device: ? Anytime you  are sleeping, including during daytime naps. ? While taking prescription pain medicines, sleeping medicines, or medicines that make you drowsy.  Return to your normal activities as told by your health care provider. Ask your health care provider what activities are safe for you.  Take over-the-counter and prescription medicines only as told by your health care provider.  If you smoke, do not smoke without supervision.  Keep all follow-up visits as told by your health care provider. This is important. Contact a health care provider if:  You have nausea or vomiting that does not get better with medicine.  You cannot eat or drink without vomiting.  You have pain that does not get better with medicine.  You are unable to pass urine.  You develop a skin rash.  You have a fever.  You have redness around your IV site that gets worse. Get help right away if:  You have difficulty breathing.  You have chest pain.  You have blood in your urine or stool, or you vomit blood. Summary  After the procedure, it is common to have a sore throat or nausea. It is also common to feel tired.  Have a responsible adult stay with you for the first 24 hours after general anesthesia. It is important to have someone help care for you until you are awake and alert.  When you feel hungry, start by eating small amounts of foods that are soft and easy to digest (bland), such as toast. Gradually return to your regular diet.  Drink enough fluid to keep your urine pale yellow.  Return to your normal activities as told by your health care provider. Ask your health care provider what activities are safe for you. This information is not intended to replace advice given to you by your health care provider. Make sure you discuss any questions you have with your health care provider. Document Revised: 04/08/2017 Document Reviewed: 11/19/2016 Elsevier Patient Education  [place].

## 2020-01-02 NOTE — OR Nursing (Signed)
Pacemaker rep and patient arrived at 74. Patient brought back to preop room 4 .  Pacemaker interrogated and rep spoke with Dr. Wyatt Haste. See rep note.

## 2020-01-02 NOTE — Progress Notes (Signed)
Talked with [facility] rep. Pt info given. She will contact Mickel Baas and inform her she is finished with her surgery and needs her pace maker turned on.

## 2020-01-02 NOTE — Anesthesia Procedure Notes (Signed)
Procedure Name: LMA Insertion Performed by: Jermall Isaacson L, CRNA Pre-anesthesia Checklist: Patient identified, Emergency Drugs available, Suction available, Patient being monitored and Timeout performed Patient Re-evaluated:Patient Re-evaluated prior to induction Oxygen Delivery Method: Circle system utilized Preoxygenation: Pre-oxygenation with 100% oxygen Induction Type: IV induction LMA: LMA inserted LMA Size: 4.0 Number of attempts: 1 Placement Confirmation: positive ETCO2,  CO2 detector and breath sounds checked- equal and bilateral Tube secured with: Tape Dental Injury: Teeth and Oropharynx as per pre-operative assessment        

## 2020-01-02 NOTE — Transfer of Care (Signed)
Immediate Anesthesia Transfer of Care Note  Patient: Julie Clay  Procedure(s) Performed: RIGHT PARTIAL MASTECTOMY AFTER NEEDLE LOCALIZATION (Right Breast)  Patient Location: PACU  Anesthesia Type:General  Level of Consciousness: awake, alert , oriented and patient cooperative  Airway & Oxygen Therapy: Patient Spontanous Breathing  Post-op Assessment: Report given to RN, Post -op Vital signs reviewed and stable and Patient moving all extremities  Post vital signs: Reviewed and stable  Last Vitals:  Vitals Value Taken Time  BP 163/76 01/02/20 1040  Temp    Pulse 91 01/02/20 1041  Resp    SpO2 96 % 01/02/20 1041  Vitals shown include unvalidated device data.  Last Pain:  Vitals:   01/02/20 0803  TempSrc: Oral  PainSc: 0-No pain      Patients Stated Pain Goal: 6 (26/83/41 9622)  Complications: No complications documented.

## 2020-01-03 ENCOUNTER — Encounter (HOSPITAL_COMMUNITY): Payer: Self-pay | Admitting: General Surgery

## 2020-01-04 LAB — SURGICAL PATHOLOGY

## 2020-01-10 ENCOUNTER — Other Ambulatory Visit: Payer: Self-pay

## 2020-01-10 ENCOUNTER — Encounter: Payer: Self-pay | Admitting: General Surgery

## 2020-01-10 ENCOUNTER — Other Ambulatory Visit: Payer: Self-pay | Admitting: *Deleted

## 2020-01-10 ENCOUNTER — Ambulatory Visit (INDEPENDENT_AMBULATORY_CARE_PROVIDER_SITE_OTHER): Payer: Medicare Other | Admitting: General Surgery

## 2020-01-10 VITALS — BP 134/86 | HR 81 | Temp 98.0°F | Resp 20 | Ht 65.0 in | Wt 227.0 lb

## 2020-01-10 DIAGNOSIS — E1141 Type 2 diabetes mellitus with diabetic mononeuropathy: Secondary | ICD-10-CM

## 2020-01-10 DIAGNOSIS — K5909 Other constipation: Secondary | ICD-10-CM

## 2020-01-10 DIAGNOSIS — E782 Mixed hyperlipidemia: Secondary | ICD-10-CM

## 2020-01-10 DIAGNOSIS — Z09 Encounter for follow-up examination after completed treatment for conditions other than malignant neoplasm: Secondary | ICD-10-CM

## 2020-01-10 DIAGNOSIS — K760 Fatty (change of) liver, not elsewhere classified: Secondary | ICD-10-CM

## 2020-01-10 DIAGNOSIS — C50912 Malignant neoplasm of unspecified site of left female breast: Secondary | ICD-10-CM

## 2020-01-10 DIAGNOSIS — I10 Essential (primary) hypertension: Secondary | ICD-10-CM

## 2020-01-10 DIAGNOSIS — M6281 Muscle weakness (generalized): Secondary | ICD-10-CM

## 2020-01-10 DIAGNOSIS — E1143 Type 2 diabetes mellitus with diabetic autonomic (poly)neuropathy: Secondary | ICD-10-CM

## 2020-01-10 DIAGNOSIS — N182 Chronic kidney disease, stage 2 (mild): Secondary | ICD-10-CM

## 2020-01-10 NOTE — Progress Notes (Signed)
Subjective:     Julie Clay  Here for postoperative visit, status post right partial mastectomy after needle localization.  Patient is doing well.  She has no complaints. Objective:    BP 134/86   Pulse 81   Temp 98 F (36.7 C) (Oral)   Resp 20   Ht 5' 5"  (1.651 m)   Wt 227 lb (103 kg)   SpO2 94%   BMI 37.77 kg/m   General:  alert, cooperative and no distress  Right breast incision healing well. Final pathology reviewed with patient.  She did have clear margins, though the anterior margin was less than 1 mm.  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  She is already followed by Dr. Delton Coombes of oncology.  I did discuss her pathology results with her.     Assessment:    Doing well postoperatively.    Plan:   I did tell the patient that given that this was an anterior margin, there is no more breast tissue that I could remove other than skin.  She understands this and agrees.  Follow-up here as needed.

## 2020-01-17 NOTE — Progress Notes (Signed)
Electrophysiology Office Note Date: 01/18/2020  ID:  Julie, Clay Apr 08, 1941, MRN [id number]  PCP: Julie Rossetti, MD Primary Cardiologist: Kate Sable, MD (Inactive) Electrophysiologist: Julie Axe, MD   CC: Pacemaker follow-up  Julie Clay is a 79 y.o. female seen today for Julie Axe, MD for acute visit due to pacemaker check. She was programmed asynchronously for a partial mastectomy, and has not felt right since. It has been her concern that she was not reprogrammed correctly.  Since last being seen in our clinic the patient reports doing more fatigue and DOE. She previously could "walk around [employer]" without issues. Now she is SOB walking from her car to inside, and it has been most notable since her partial mastectomy 01/02/2020. The procedure was on a Wednesday, and she states she basically laid in bed until the following Monday. She denies orthopnea, worsening peripheral edema, bleeding, nausea, vomiting, or diarrhea.  She has occasional "soft stools" but this is chronic. She was previously exercising multiple times a week, but has found it difficult to navigate the parking lot at [employer], so hasn't been since June.  Device History: StMudlogger PPM implanted 2019 for Mobitz II  Past Medical History:  Diagnosis Date  . Arthritis    Phreesia 01/17/2020  . Asthma    spirometry (2011)- mild ventilary defect  . Bone spur    "heels, knees, left shoulder" (06/22/2017)  . Breast cancer, left breast ([place]) dx'd 03/2011  . Cancer ([place])    Phreesia 01/17/2020  . Chronic bronchitis ([place])   . COPD (chronic obstructive pulmonary disease) ([place])   . Diabetes mellitus without complication ([place])    Phreesia 01/17/2020  . Fatty liver 2013   enlarged  . GERD (gastroesophageal reflux disease)   . HSV (herpes simplex virus) infection   . Hyperlipidemia   . Hypertension   . Lung nodule seen on imaging study 2013  . Lymphedema    Left arm  . Mobitz II     a. s/p STJ dual chamber PPM   . Osteoarthritis    "all over" (06/22/2017)  . Sleep apnea    Stop Bang score of 5. Pt has had sleep study, but was shown to be negative for sleep apnea.  . Type II diabetes mellitus ([place])   . Ventral hernia, unspecified, without mention of obstruction or gangrene    Past Surgical History:  Procedure Laterality Date  . BREAST BIOPSY Left 04/2011  . BREAST BIOPSY Left 06/22/13  . BREAST BIOPSY Left 09/12/2013   Procedure: BREAST BIOPSY WITH NEEDLE LOCALIZATION;  Surgeon: Jamesetta So, MD;  Location: [facility];  Service: General;  Laterality: Left;  . BREAST LUMPECTOMY Left 04/2011 X 2  . BREAST SURGERY N/A    Phreesia 01/17/2020  . CATARACT EXTRACTION W/ INTRAOCULAR LENS  IMPLANT, BILATERAL Bilateral   . CATARACT EXTRACTION, BILATERAL     on different occassions   . [place]  . COLON SURGERY N/A    Phreesia 01/17/2020  . DILATION AND CURETTAGE OF UTERUS  11/06/2013  . HAND LIGAMENT RECONSTRUCTION Right ~ 2010  . HYSTEROSCOPY WITH D & C N/A 11/06/2013   Procedure: DILATATION AND CURETTAGE /HYSTEROSCOPY;  Surgeon: Jonnie Kind, MD;  Location: [facility];  Service: Gynecology;  Laterality: N/A;  . INSERT / REPLACE / REMOVE PACEMAKER  06/22/2017  . LUNG LOBECTOMY Right 1988   Fungal Infection  . PACEMAKER IMPLANT N/A 06/22/2017   Procedure: PACEMAKER IMPLANT;  Surgeon: Deboraha Sprang, MD;  Location: [place] [facility];  Service: Cardiovascular;  Laterality: N/A;  . PARTIAL MASTECTOMY WITH NEEDLE LOCALIZATION Right 01/02/2020   Procedure: RIGHT PARTIAL MASTECTOMY AFTER NEEDLE LOCALIZATION;  Surgeon: Aviva Signs, MD;  Location: [facility];  Service: General;  Laterality: Right;  . POLYPECTOMY N/A 11/06/2013   Procedure: POLYPECTOMY (REMOVAL ENDOMETRIAL POLYP);  Surgeon: Jonnie Kind, MD;  Location: [facility];  Service: Gynecology;  Laterality: N/A;  . RECTOCELE REPAIR N/A 11/06/2013   Procedure: POSTERIOR REPAIR (RECTOCELE);  Surgeon: Jonnie Kind, MD;   Location: [facility];  Service: Gynecology;  Laterality: N/A;  . SHOULDER ARTHROSCOPY WITH OPEN ROTATOR CUFF REPAIR Left 07/26/2012  . TONSILLECTOMY  1949  . TUBAL LIGATION      Current Outpatient Medications  Medication Sig Dispense Refill  . acetaminophen (TYLENOL) 500 MG tablet Take 1,000 mg by mouth every 6 (six) hours as needed for moderate pain.     Vanita Ingles Leaf POWD Take 800 mg by mouth 2 (two) times daily.     . Ascorbic Acid (VITAMIN C) 1000 MG tablet Take 1,000 mg by mouth 2 (two) times daily.    Marland Kitchen b complex vitamins tablet Take 1 tablet by mouth 2 (two) times daily.     . Blood Glucose Monitoring Suppl (BLOOD GLUCOSE SYSTEM PAK) KIT Use as directed to monitor FSBS 1x daily. Dx: E11.9. Please dispense as Accu-Chek Aviva 1 each 1  . Calcium Polycarbophil (FIBER-CAPS PO) Take by mouth every other day.    . Cholecalciferol (VITAMIN D3) 2000 UNITS TABS Take 2,000 Units by mouth 2 (two) times daily.    . fluocinonide cream (LIDEX) 6.75 % Apply 1 application topically 2 (two) times daily as needed (poison ivy/bites/hives). 30 g 11  . Ginkgo Biloba 120 MG CAPS Take 120 mg by mouth in the morning and at bedtime.     Marland Kitchen glipiZIDE (GLUCOTROL) 10 MG tablet TAKE 1/2 (ONE-HALF) TABLET BY MOUTH TWICE DAILY BEFORE A MEAL FOR DIABETES 180 tablet 0  . Glucose Blood (BLOOD GLUCOSE TEST STRIPS) STRP Use as directed to monitor FSBS 1x daily. Dx: E11.9. Please dispense as Accu-Chek Aviva 100 each 1  . Krill Oil 1000 MG CAPS Take 1,000 mg by mouth 2 (two) times daily.     . Lancets MISC Use as directed to monitor FSBS 1x daily. Dx: E11.9. Please dispense as Accu-Chek Aviva 100 each 1  . LORazepam (ATIVAN) 0.5 MG tablet Take 1 tablet (0.5 mg total) by mouth 2 (two) times daily as needed for anxiety. 30 tablet 1  . melatonin 5 MG TABS Take 5 mg by mouth at bedtime as needed (sleep).     . metFORMIN (GLUCOPHAGE) 1000 MG tablet TAKE 1 TABLET BY MOUTH TWICE DAILY WITH A MEAL FOR DIABETES 180 tablet 0  .  metoprolol succinate (TOPROL-XL) 25 MG 24 hr tablet Take 0.5 tablets (12.5 mg total) by mouth daily. 45 tablet 3  . Multiple Vitamin (MULTIVITAMIN WITH MINERALS) TABS tablet Take 1 tablet by mouth daily.    . naftifine (NAFTIN) 1 % cream Apply topically 2 (two) times daily as needed. 60 g 2  . rosuvastatin (CRESTOR) 5 MG tablet TAKE 1 TABLET BY MOUTH ONCE DAILY FOR CHOLESTEROL 90 tablet 0  . simethicone (MYLICON) 80 MG chewable tablet Chew 80 mg by mouth as needed for flatulence.     Marland Kitchen tiZANidine (ZANAFLEX) 4 MG tablet TAKE 1/2 (ONE-HALF) TABLET BY MOUTH EVERY 6 HOURS AS NEEDED FOR MUSCLE  SPASM 30 tablet 2  . torsemide (DEMADEX) 20 MG tablet Take 10 mg by mouth daily as needed (swelling).     . TRULICITY 9.48 NI/6.2VO SOPN INJECT 1 SYRINGE SUBCUTANEOUSLY ONCE A WEEK 0.5 mL 2   No current facility-administered medications for this visit.    Allergies:   Arimidex [anastrozole], Other, Adhesive [tape], Lisinopril, Tetanus toxoids, and Tetracyclines & related   Social History: Social History   Socioeconomic History  . Marital status: Married    Spouse name: Not on file  . Number of children: Not on file  . Years of education: Not on file  . Highest education level: Not on file  Occupational History  . 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 Smoking status: Never Smoker  . Smokeless tobacco: Never Used  Vaping Use  . Vaping Use: Never used  Substance and Sexual Activity  . Alcohol use: Yes    Alcohol/week: 0.0 standard drinks    Comment: 06/22/2017 "might have a drink twice a year"  . Drug use: No  . Sexual activity: Not Currently    Birth control/protection: Post-menopausal  Other Topics Concern  . Not on file  Social History Narrative  . Not on file   Social Determinants of Health   Financial Resource Strain:   . Difficulty of Paying Living Expenses: Not on file  Food Insecurity:   . Worried About Charity fundraiser in the  Last Year: Not on file  . Ran Out of Food in the Last Year: Not on file  Transportation Needs:   . Lack of Transportation (Medical): Not on file  . Lack of Transportation (Non-Medical): Not on file  Physical Activity:   . Days of Exercise per Week: Not on file  . Minutes of Exercise per Session: Not on file  Stress:   . Feeling of Stress : Not on file  Social Connections:   . Frequency of Communication with Friends and Family: Not on file  . Frequency of Social Gatherings with Friends and Family: Not on file  . Attends Religious Services: Not on file  . Active Member of Clubs or Organizations: Not on file  . Attends Archivist Meetings: Not on file  . Marital Status: Not on file  Intimate Partner Violence:   . Fear of Current or Ex-Partner: Not on file  . Emotionally Abused: Not on file  . Physically Abused: Not on file  . Sexually Abused: Not on file    Family History: Family History  Problem Relation Age of Onset  . Hypertension Mother   . Heart disease Mother   . Diabetes Mother   . Kidney disease Mother        ESRD  . Cancer Mother        Bladder  . Hypertension Father   . Hyperlipidemia Father   . Heart disease Father   . Diabetes Father   . Cancer Father        Stomach Cancer  . Diabetes Sister   . Heart disease Sister   . Alzheimer's disease Paternal Aunt   . Stroke Maternal Grandfather   . Cancer Paternal Grandfather        stomach  . Cancer Maternal Uncle        prostate  . Cancer Paternal Uncle        stomach     Review of Systems: All other systems reviewed and are otherwise negative except  as noted above.  Physical Exam: Vitals:   01/18/20 0817  BP: 140/68  Pulse: 70  SpO2: 98%  Weight: 232 lb (105.2 kg)  Height: 5' 5"  (1.651 m)     GEN- The patient is well appearing, alert and oriented x 3 today.   HEENT: normocephalic, atraumatic; sclera clear, conjunctiva pink; hearing intact; oropharynx clear; neck supple  Lungs- Clear to  ausculation bilaterally, normal work of breathing.  No wheezes, rales, rhonchi Heart- Regular rate and rhythm, no murmurs, rubs or gallops  GI- Obese, soft, non-tender, non-distended, bowel sounds present  Extremities- no clubbing or cyanosis. 1-2+ edema 1/3 to knee MS- no significant deformity or atrophy Skin- warm and dry, no rash or lesion; PPM pocket well healed Psych- euthymic mood, full affect Neuro- strength and sensation are intact  PPM Interrogation- reviewed in detail today,  See PACEART report  EKG:  EKG is not ordered today.  Recent Labs: 07/23/2019: Magnesium 1.7; TSH 3.12 11/30/2019: ALT 12; BUN 21; Creat 0.87; Hemoglobin 12.9; Platelets 238; Potassium 4.5; Sodium 141   Wt Readings from Last 3 Encounters:  01/18/20 232 lb (105.2 kg)  01/10/20 227 lb (103 kg)  01/02/20 225 lb (102.1 kg)     Other studies Reviewed: Additional studies/ records that were reviewed today include: Previous EP office notes, Previous remote checks, Most recent labwork.   Assessment and Plan:  1. 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 Sinus tachycardia Continue low dose metoprolol  3. Chronic diastolic CHF Volume status looks OK. She has chronic peripheral edema but her JVP does not appear elevated.  She denies orthopnea.  She takes torsemide as needed and planned to take 1/2 (10 mg) today for edema. I encouraged her to take 20 mg once, to see if this helps with her breathing.   4. Deconditioning Multifactorial in the setting of deconditioning and overall sedentary lifestyle. She does NOT appear significantly volume overloaded on exam today, and denies orthopnea.  I have encouraged her to return to exercising as able. She will take 1 torsemide for her chronic fluctuating peripheral edema.    Current medicines are reviewed at length with the patient today.   The patient does not have concerns regarding her medicines.  The  following changes were made today:  none  Labs/ tests ordered today include:  Orders Placed This Encounter  Procedures  . Comp Met (CMET)  . TSH  . CBC w/Diff  . Magnesium   Disposition:   Follow up with Dr. Caryl Comes in 12 Months. Sooner with symptoms.   Jacalyn Lefevre, PA-C  01/18/2020 9:06 AM  [facility] [address] [place] [address] [phone] (office) [phone] (fax)

## 2020-01-18 ENCOUNTER — Other Ambulatory Visit: Payer: Self-pay

## 2020-01-18 ENCOUNTER — Other Ambulatory Visit: Payer: Self-pay | Admitting: *Deleted

## 2020-01-18 ENCOUNTER — Ambulatory Visit (INDEPENDENT_AMBULATORY_CARE_PROVIDER_SITE_OTHER): Payer: Medicare Other | Admitting: *Deleted

## 2020-01-18 ENCOUNTER — Encounter: Payer: Self-pay | Admitting: Student

## 2020-01-18 ENCOUNTER — Ambulatory Visit (INDEPENDENT_AMBULATORY_CARE_PROVIDER_SITE_OTHER): Payer: Medicare Other | Admitting: Student

## 2020-01-18 ENCOUNTER — Ambulatory Visit: Payer: Medicare Other | Admitting: Pharmacist

## 2020-01-18 VITALS — BP 140/68 | HR 70 | Ht 65.0 in | Wt 232.0 lb

## 2020-01-18 DIAGNOSIS — R5383 Other fatigue: Secondary | ICD-10-CM | POA: Diagnosis not present

## 2020-01-18 DIAGNOSIS — Z950 Presence of cardiac pacemaker: Secondary | ICD-10-CM

## 2020-01-18 DIAGNOSIS — I441 Atrioventricular block, second degree: Secondary | ICD-10-CM

## 2020-01-18 DIAGNOSIS — E782 Mixed hyperlipidemia: Secondary | ICD-10-CM

## 2020-01-18 DIAGNOSIS — I10 Essential (primary) hypertension: Secondary | ICD-10-CM

## 2020-01-18 DIAGNOSIS — R5381 Other malaise: Secondary | ICD-10-CM | POA: Diagnosis not present

## 2020-01-18 DIAGNOSIS — E1143 Type 2 diabetes mellitus with diabetic autonomic (poly)neuropathy: Secondary | ICD-10-CM

## 2020-01-18 DIAGNOSIS — Z23 Encounter for immunization: Secondary | ICD-10-CM | POA: Diagnosis not present

## 2020-01-18 LAB — CUP PACEART INCLINIC DEVICE CHECK
Battery Remaining Longevity: 102 mo
Battery Voltage: 2.99 V
Brady Statistic RA Percent Paced: 4.7 %
Brady Statistic RV Percent Paced: 99.99 %
Date Time Interrogation Session: 20211001085945
Implantable Lead Implant Date: 20190306
Implantable Lead Implant Date: 20190306
Implantable Lead Location: 753859
Implantable Lead Location: 753860
Implantable Lead Model: 5076
Implantable Lead Model: 5076
Implantable Pulse Generator Implant Date: 20190306
Lead Channel Impedance Value: 337.5 Ohm
Lead Channel Impedance Value: 425 Ohm
Lead Channel Pacing Threshold Amplitude: 0.5 V
Lead Channel Pacing Threshold Amplitude: 0.5 V
Lead Channel Pacing Threshold Amplitude: 0.75 V
Lead Channel Pacing Threshold Amplitude: 0.75 V
Lead Channel Pacing Threshold Pulse Width: 0.5 ms
Lead Channel Pacing Threshold Pulse Width: 0.5 ms
Lead Channel Pacing Threshold Pulse Width: 0.5 ms
Lead Channel Pacing Threshold Pulse Width: 0.5 ms
Lead Channel Sensing Intrinsic Amplitude: 4.1 mV
Lead Channel Sensing Intrinsic Amplitude: 6.8 mV
Lead Channel Setting Pacing Amplitude: 2 V
Lead Channel Setting Pacing Amplitude: 2.5 V
Lead Channel Setting Pacing Pulse Width: 0.5 ms
Lead Channel Setting Sensing Sensitivity: 2 mV
Pulse Gen Model: 2272
Pulse Gen Serial Number: 9001027

## 2020-01-18 NOTE — Patient Instructions (Addendum)
Medication Instructions:  *If you need a refill on your cardiac medications before your next appointment, please call your pharmacy*  Lab Work: Your physician has recommended that you have lab work today: CMET, CBC, Magnesium Level, TSH - Please have PCP fax results to [phone] ATTN: Oda Kilts, PA-C  If you have labs (blood work) drawn today and your tests are completely normal, you will receive your results only by: Marland Kitchen MyChart Message (if you have MyChart) OR . A paper copy in the mail If you have any lab test that is abnormal or we need to change your treatment, we will call you to review the results.  Follow-Up: At [facility], you and your health needs are our priority.  As part of our continuing mission to provide you with exceptional heart care, we have created designated Provider Care Teams.  These Care Teams include your primary Cardiologist (physician) and Advanced Practice Providers (APPs -  Physician Assistants and Nurse Practitioners) who all work together to provide you with the care you need, when you need it.  We recommend signing up for the patient portal called "MyChart".  Sign up information is provided on this After Visit Summary.  MyChart is used to connect with patients for Virtual Visits (Telemedicine).  Patients are able to view lab/test results, encounter notes, upcoming appointments, etc.  Non-urgent messages can be sent to your provider as well.   To learn more about what you can do with MyChart, go to [email or web address].    Your next appointment:   Your physician wants you to follow-up in: 1 YEAR with Dr. Caryl Comes. You will receive a reminder letter in the mail two months in advance. If you don't receive a letter, please call our office to schedule the follow-up appointment.  The format for your next appointment:   In Person with [name], MD

## 2020-01-18 NOTE — Patient Instructions (Addendum)
Visit Information Thank you for meeting with me today!  I look forward to working with you to help you meet all of your healthcare goals and answer any questions you may have.  Feel free to contact me anytime!  Goals Addressed            This Visit's Progress   . Pharmacy Care Plan:       CARE PLAN ENTRY (see longitudinal plan of care for additional care plan information)  Current Barriers:  . Chronic Disease Management support, education, and care coordination needs related to Hypertension, Hyperlipidemia, and Diabetes   Hypertension BP Readings from Last 3 Encounters:  01/18/20 140/68  01/10/20 134/86  01/02/20 (!) 168/84   . Pharmacist Clinical Goal(s): o Over the next 180 days, patient will work with PharmD and providers to maintain BP goal <140/90 . Current regimen:  o Metoprolol XL 25mg  daily . Interventions: o Reviewed home blood pressure monitoring o Comprehensive medication review . Patient self care activities - Over the next 180 days, patient will: o Check BP periodically, document, and provide at future appointments o Ensure daily salt intake < 2300 mg/day  Hyperlipidemia Lab Results  Component Value Date/Time   LDLCALC 67 11/30/2019 10:32 AM   LDLDIRECT 102 (H) 12/09/2011 10:35 AM   . Pharmacist Clinical Goal(s): o Over the next 180 days, patient will work with PharmD and providers to maintain LDL goal < 70 . Current regimen:  o Rosuvastatin 5mg  daily . Interventions: o Reviewed most recent lipid panel o Discussed importance of statins . Patient self care activities - Over the next 180 days, patient will: o Continue to focus on medication adherence by pill count  Diabetes Lab Results  Component Value Date/Time   HGBA1C 6.3 (H) 11/30/2019 10:32 AM   HGBA1C 6.4 (H) 04/26/2019 11:27 AM   . Pharmacist Clinical Goal(s): o Over the next 180 days, patient will work with PharmD and providers to maintain A1c goal <6.5% . Current regimen:  . Trulicity  0.75mg  once weekly . Glipizide 10mg  one-half tablet twice daily . Metformin 1000mg  twice daily . Interventions: o Reviewed home blood sugar readings o Discussed hypoglycemia and how to treat . Patient self care activities - Over the next 180 days, patient will: o Check blood sugar once daily, document, and provide at future appointments o Contact provider with any episodes of hypoglycemia   Initial goal documentation        Julie Clay was given information about Chronic Care Management services today including:  1. CCM service includes personalized support from designated clinical staff supervised by her physician, including individualized plan of care and coordination with other care providers 2. 24/7 contact phone numbers for assistance for urgent and routine care needs. 3. Standard insurance, coinsurance, copays and deductibles apply for chronic care management only during months in which we provide at least 20 minutes of these services. Most insurances cover these services at 100%, however patients may be responsible for any copay, coinsurance and/or deductible if applicable. This service may help you avoid the need for more expensive face-to-face services. 4. Only one practitioner may furnish and bill the service in a calendar month. 5. The patient may stop CCM services at any time (effective at the end of the month) by phone call to the office staff.  Patient agreed to services and verbal consent obtained.   The patient verbalized understanding of instructions provided today and agreed to receive a mailed copy of patient instruction and/or educational materials. Telephone  follow up appointment with pharmacy team member scheduled for: 6 months  Beverly Milch, PharmD Clinical Pharmacist [facility] [phone]   High Cholesterol  High cholesterol is a condition in which the blood has high levels of a white, waxy, fat-like substance (cholesterol). The  human body needs small amounts of cholesterol. The liver makes all the cholesterol that the body needs. Extra (excess) cholesterol comes from the food that we eat. Cholesterol is carried from the liver by the blood through the blood vessels. If you have high cholesterol, deposits (plaques) may build up on the walls of your blood vessels (arteries). Plaques make the arteries narrower and stiffer. Cholesterol plaques increase your risk for heart attack and stroke. Work with your health care provider to keep your cholesterol levels in a healthy range. What increases the risk? This condition is more likely to develop in people who:  Eat foods that are high in animal fat (saturated fat) or cholesterol.  Are overweight.  Are not getting enough exercise.  Have a family history of high cholesterol. What are the signs or symptoms? There are no symptoms of this condition. How is this diagnosed? This condition may be diagnosed from the results of a blood test.  If you are older than age 30, your health care provider may check your cholesterol every 4-6 years.  You may be checked more often if you already have high cholesterol or other risk factors for heart disease. The blood test for cholesterol measures:  "Bad" cholesterol (LDL cholesterol). This is the main type of cholesterol that causes heart disease. The desired level for LDL is less than 100.  "Good" cholesterol (HDL cholesterol). This type helps to protect against heart disease by cleaning the arteries and carrying the LDL away. The desired level for HDL is 60 or higher.  Triglycerides. These are fats that the body can store or burn for energy. The desired number for triglycerides is lower than 150.  Total cholesterol. This is a measure of the total amount of cholesterol in your blood, including LDL cholesterol, HDL cholesterol, and triglycerides. A healthy number is less than 200. How is this treated? This condition is treated with diet  changes, lifestyle changes, and medicines. Diet changes  This may include eating more whole grains, fruits, vegetables, nuts, and fish.  This may also include cutting back on red meat and foods that have a lot of added sugar. Lifestyle changes  Changes may include getting at least 40 minutes of aerobic exercise 3 times a week. Aerobic exercises include walking, biking, and swimming. Aerobic exercise along with a healthy diet can help you maintain a healthy weight.  Changes may also include quitting smoking. Medicines  Medicines are usually given if diet and lifestyle changes have failed to reduce your cholesterol to healthy levels.  Your health care provider may prescribe a statin medicine. Statin medicines have been shown to reduce cholesterol, which can reduce the risk of heart disease. Follow these instructions at home: Eating and drinking If told by your health care provider:  Eat chicken (without skin), fish, veal, shellfish, ground [place] breast, and round or loin cuts of red meat.  Do not eat fried foods or fatty meats, such as hot dogs and salami.  Eat plenty of fruits, such as apples.  Eat plenty of vegetables, such as broccoli, potatoes, and carrots.  Eat beans, peas, and lentils.  Eat grains such as barley, rice, couscous, and bulgur wheat.  Eat pasta without cream sauces.  Use skim or nonfat milk, and eat low-fat or nonfat yogurt and cheeses.  Do not eat or drink whole milk, cream, ice cream, egg yolks, or hard cheeses.  Do not eat stick margarine or tub margarines that contain trans fats (also called partially hydrogenated oils).  Do not eat saturated tropical oils, such as coconut oil and palm oil.  Do not eat cakes, cookies, crackers, or other baked goods that contain trans fats.  General instructions  Exercise as directed by your health care provider. Increase your activity level with activities such as gardening, walking, and taking the stairs.  Take  over-the-counter and prescription medicines only as told by your health care provider.  Do not use any products that contain nicotine or tobacco, such as cigarettes and e-cigarettes. If you need help quitting, ask your health care provider.  Keep all follow-up visits as told by your health care provider. This is important. Contact a health care provider if:  You are struggling to maintain a healthy diet or weight.  You need help to start on an exercise program.  You need help to stop smoking. Get help right away if:  You have chest pain.  You have trouble breathing. This information is not intended to replace advice given to you by your health care provider. Make sure you discuss any questions you have with your health care provider. Document Revised: 04/08/2017 Document Reviewed: 10/04/2015 Elsevier Patient Education  [place].

## 2020-01-18 NOTE — Chronic Care Management (AMB) (Signed)
Chronic Care Management Pharmacy  Name: Julie Clay  MRN: [id number] DOB: 02-Dec-1940   Chief Complaint/ HPI  [place],  79 y.o. , female presents for their Initial CCM visit with the clinical pharmacist In office.  PCP : Alycia Rossetti, MD  Their chronic conditions include: HTN, Asthma, Diabetes w/ neuropathy, CKD, HLD.  Office Visits: 12/04/2019 [facility]) -   Patient has been diagnosed with breast cancer which is recurrence  Complains of constipation with occasional blow out and loose stool  Restart fiber a few times per week   08/02/2019 [facility]) -   She reports muscle weakness in upper extremities  TSH to rule out thyroid concerns  Medications: Outpatient Encounter Medications as of 01/18/2020  Medication Sig  . acetaminophen (TYLENOL) 500 MG tablet Take 1,000 mg by mouth every 6 (six) hours as needed for moderate pain.   Vanita Ingles Leaf POWD Take 800 mg by mouth 2 (two) times daily.   . Ascorbic Acid (VITAMIN C) 1000 MG tablet Take 1,000 mg by mouth 2 (two) times daily.  Marland Kitchen b complex vitamins tablet Take 1 tablet by mouth 2 (two) times daily.   . Blood Glucose Monitoring Suppl (BLOOD GLUCOSE SYSTEM PAK) KIT Use as directed to monitor FSBS 1x daily. Dx: E11.9. Please dispense as Accu-Chek Aviva  . Calcium Polycarbophil (FIBER-CAPS PO) Take by mouth every other day.  . Cholecalciferol (VITAMIN D3) 2000 UNITS TABS Take 2,000 Units by mouth 2 (two) times daily.  . fluocinonide cream (LIDEX) 9.21 % Apply 1 application topically 2 (two) times daily as needed (poison ivy/bites/hives).  . Ginkgo Biloba 120 MG CAPS Take 120 mg by mouth in the morning and at bedtime.   Marland Kitchen glipiZIDE (GLUCOTROL) 10 MG tablet TAKE 1/2 (ONE-HALF) TABLET BY MOUTH TWICE DAILY BEFORE A MEAL FOR DIABETES  . Glucose Blood (BLOOD GLUCOSE TEST STRIPS) STRP Use as directed to monitor FSBS 1x daily. Dx: E11.9. Please dispense as Accu-Chek Aviva  . Krill Oil 1000 MG CAPS Take 1,000 mg by mouth 2  (two) times daily.   . Lancets MISC Use as directed to monitor FSBS 1x daily. Dx: E11.9. Please dispense as Accu-Chek Aviva  . melatonin 5 MG TABS Take 5 mg by mouth at bedtime as needed (sleep).   . metFORMIN (GLUCOPHAGE) 1000 MG tablet TAKE 1 TABLET BY MOUTH TWICE DAILY WITH A MEAL FOR DIABETES  . metoprolol succinate (TOPROL-XL) 25 MG 24 hr tablet Take 0.5 tablets (12.5 mg total) by mouth daily.  . Multiple Vitamin (MULTIVITAMIN WITH MINERALS) TABS tablet Take 1 tablet by mouth daily.  . naftifine (NAFTIN) 1 % cream Apply topically 2 (two) times daily as needed.  . rosuvastatin (CRESTOR) 5 MG tablet TAKE 1 TABLET BY MOUTH ONCE DAILY FOR CHOLESTEROL  . simethicone (MYLICON) 80 MG chewable tablet Chew 80 mg by mouth as needed for flatulence.   Marland Kitchen tiZANidine (ZANAFLEX) 4 MG tablet TAKE 1/2 (ONE-HALF) TABLET BY MOUTH EVERY 6 HOURS AS NEEDED FOR MUSCLE SPASM  . torsemide (DEMADEX) 20 MG tablet Take 10 mg by mouth daily as needed (swelling).   . TRULICITY 1.94 RD/4.0CX SOPN INJECT 1 SYRINGE SUBCUTANEOUSLY ONCE A WEEK  . LORazepam (ATIVAN) 0.5 MG tablet Take 1 tablet (0.5 mg total) by mouth 2 (two) times daily as needed for anxiety. (Patient not taking: Reported on 01/18/2020)   No facility-administered encounter medications on file as of 01/18/2020.     Current Diagnosis/Assessment:  Goals Addressed  This Visit's Progress   . Pharmacy Care Plan:       CARE PLAN ENTRY (see longitudinal plan of care for additional care plan information)  Current Barriers:  . Chronic Disease Management support, education, and care coordination needs related to Hypertension, Hyperlipidemia, and Diabetes   Hypertension BP Readings from Last 3 Encounters:  01/18/20 140/68  01/10/20 134/86  01/02/20 (!) 168/84   . Pharmacist Clinical Goal(s): o Over the next 180 days, patient will work with PharmD and providers to maintain BP goal <140/90 . Current regimen:  o Metoprolol XL 48m  daily . Interventions: o Reviewed home blood pressure monitoring o Comprehensive medication review . Patient self care activities - Over the next 180 days, patient will: o Check BP periodically, document, and provide at future appointments o Ensure daily salt intake < 2300 mg/day  Hyperlipidemia Lab Results  Component Value Date/Time   LDLCALC 67 11/30/2019 10:32 AM   LDLDIRECT 102 (H) 12/09/2011 10:35 AM   . Pharmacist Clinical Goal(s): o Over the next 180 days, patient will work with PharmD and providers to maintain LDL goal < 70 . Current regimen:  o Rosuvastatin 543mdaily . Interventions: o Reviewed most recent lipid panel o Discussed importance of statins . Patient self care activities - Over the next 180 days, patient will: o Continue to focus on medication adherence by pill count  Diabetes Lab Results  Component Value Date/Time   HGBA1C 6.3 (H) 11/30/2019 10:32 AM   HGBA1C 6.4 (H) 04/26/2019 11:27 AM   . Pharmacist Clinical Goal(s): o Over the next 180 days, patient will work with PharmD and providers to maintain A1c goal <6.5% . Current regimen:  . Trulicity 0.6.78LFnce weekly . Glipizide 1074mne-half tablet twice daily . Metformin 1000m100mice daily . Interventions: o Reviewed home blood sugar readings o Discussed hypoglycemia and how to treat . Patient self care activities - Over the next 180 days, patient will: o Check blood sugar once daily, document, and provide at future appointments o Contact provider with any episodes of hypoglycemia   Initial goal documentation        Asthma   Eosinophil count:   Lab Results  Component Value Date/Time   EOSPCT 2.8 11/30/2019 10:32 AM  %                               Eos (Absolute):  Lab Results  Component Value Date/Time   EOSABS 252 11/30/2019 10:32 AM    Tobacco Status:  Social History   Tobacco Use  Smoking Status Never Smoker  Smokeless Tobacco Never Used    Patient has failed these meds in  past: none noted Patient is currently controlled on the following medications:   None at this time  Using maintenance inhaler regularly? No Frequency of rescue inhaler use:  infrequently  She has not had to have any albuterol refilled for over a year.  Reports her breathing has been well controrolled.  Plan  Continue current medications  Hypertension   BP goal is:  <140/90  Office blood pressures are  BP Readings from Last 3 Encounters:  01/18/20 140/68  01/10/20 134/86  01/02/20 (!) 168/84   Patient checks BP at home infrequently Patient home BP readings are ranging:  No logs available  Patient has failed these meds in the past: lisinopril (cough) Patient is currently controlled on the following medications:  . MeMarland Kitchenoprolol XL 25mg17mly  Patient's BP  has been well controlled the past few office visits.  Followed by cardiology.  Denies dizziness, headaches.  Plan  Continue current medications     Diabetes w/ neuropathy   A1c goal <6.5%  Recent Relevant Labs: Lab Results  Component Value Date/Time   HGBA1C 6.3 (H) 11/30/2019 10:32 AM   HGBA1C 6.4 (H) 04/26/2019 11:27 AM   MICROALBUR <0.2 07/23/2019 02:24 PM   MICROALBUR 1.3 04/25/2018 10:48 AM    Last diabetic Eye exam:  Lab Results  Component Value Date/Time   HMDIABEYEEXA No Retinopathy 05/23/2017 12:00 AM    Last diabetic Foot exam: No results found for: HMDIABFOOTEX   Checking BG: Daily  Recent FBG Readings: 101 this morning   Patient has failed these meds in past: none noted Patient is currently controlled on the following medications: . Trulicity 7.74JO once weekly . Glipizide 26m one-half tablet twice daily . Metformin 10077mtwice daily  Reports adherence on all medications.  She does report some lows when she takes her glipizide and may not eat as big of a meal.  She treats appropriately with quick sugars.  A1c well controlled, counseled on importance of eating around doses of glipizide.  If  she keeps having lows she needs to [place] Would recommend decrease or d/c glipizide if hypoglycemia becomes an issue.  Plan  Continue current medications, contact providers with sustained hypoglycemia. Hyperlipidemia   LDL goal < 70  Lipid Panel     Component Value Date/Time   CHOL 138 11/30/2019 1032   TRIG 196 (H) 11/30/2019 1032   HDL 44 (L) 11/30/2019 1032   LDLCALC 67 11/30/2019 1032   LDLDIRECT 102 (H) 12/09/2011 1035    Hepatic Function Latest Ref Rng & Units 11/30/2019 10/12/2019 07/23/2019  Total Protein 6.1 - 8.1 g/dL 6.2 7.1 6.6  Albumin 3.5 - 5.0 g/dL - 3.9 -  AST 10 - 35 U/L _0 ALT 6 - 29 U/L _1 Alk Phosphatase 38 - 126 U/L - 51 -  Total Bilirubin 0.2 - 1.2 mg/dL 0.5 0.6 0.4  Bilirubin, Direct <=0.2 mg/dL - - -     The 10-year ASCVD risk score (GMikey BussingC Jr., et al., 2013) is: 48.8%   Values used to calculate the score:     Age: [age]     Sex: Female     Is Non-Hispanic African American: No     Diabetic: Yes     Tobacco smoker: No     Systolic Blood Pressure: 14878mHg     Is BP treated: Yes     HDL Cholesterol: 44 mg/dL     Total Cholesterol: 138 mg/dL   Patient has failed these meds in past: none noted Patient is currently controlled on the following medications:  . Rosuvastatin 23m32mShe denies myalgia, LDL controlled at last OV.  Working on dietary modifications lower in carbohydrates and sugars.  Plan  Continue current medications CKD   Kidney Function Lab Results  Component Value Date/Time   CREATININE 0.87 11/30/2019 10:32 AM   CREATININE 0.76 10/12/2019 10:46 AM   CREATININE 0.85 07/23/2019 12:44 PM   GFRNONAA >60 10/12/2019 10:46 AM   GFRNONAA 71 04/21/2018 09:57 AM   GFRAA >60 10/12/2019 10:46 AM   GFRAA 82 04/21/2018 09:57 AM   K 4.5 11/30/2019 10:32 AM   K 4.2 10/12/2019 10:46 AM    Analyzed medication profile for safety based on current kidney function.  Plan  Continue current medications  Vaccines    Reviewed and discussed patient's vaccination history.    Immunization History  Administered Date(s) Administered  . Fluad Quad(high Dose 65+) 12/26/2018  . Hep A / Hep B 05/26/2018, 06/26/2018, 11/22/2018  . Influenza Split 01/14/2012  . Influenza,inj,Quad PF,6+ Mos 02/19/2013, 02/17/2015, 01/27/2016, 01/14/2017, 02/09/2018  . PFIZER SARS-COV-2 Vaccination 06/24/2019, 07/15/2019  . Pneumococcal Conjugate-13 08/02/2014  . Zoster 04/05/2014    Plan  Recommended patient receive Flu vaccine in office.  Medication Management   . Miscellaneous medications:  o Torsemide 5m daily o Tizanidine 438mone-half tablet po q6h prn spasm . OTC's:  o ASA 8153m Melatonin 5mg20mPatient currently uses [facility]. Patient reports using pill box method to organize medications and promote adherence. . Patient denies missed doses of medication.   ChriBeverly MilcharmD Clinical Pharmacist [phone]

## 2020-01-22 LAB — CBC WITH DIFFERENTIAL/PLATELET

## 2020-01-22 LAB — EXTRA LAV TOP TUBE

## 2020-01-22 LAB — COMPLETE METABOLIC PANEL WITH GFR
AG Ratio: 1.6 (calc) (ref 1.0–2.5)
ALT: 13 U/L (ref 6–29)
AST: 17 U/L (ref 10–35)
Albumin: 4.1 g/dL (ref 3.6–5.1)
Alkaline phosphatase (APISO): 52 U/L (ref 37–153)
BUN: 17 mg/dL (ref 7–25)
CO2: 27 mmol/L (ref 20–32)
Calcium: 10 mg/dL (ref 8.6–10.4)
Chloride: 106 mmol/L (ref 98–110)
Creat: 0.84 mg/dL (ref 0.60–0.93)
GFR, Est African American: 78 mL/min/{1.73_m2} (ref >=60)
GFR, Est Non African American: 67 mL/min/{1.73_m2} (ref >=60)
Globulin: 2.5 g/dL (calc) (ref 1.9–3.7)
Glucose, Bld: 80 mg/dL (ref 65–99)
Potassium: 4.5 mmol/L (ref 3.5–5.3)
Sodium: 142 mmol/L (ref 135–146)
Total Bilirubin: 0.4 mg/dL (ref 0.2–1.2)
Total Protein: 6.6 g/dL (ref 6.1–8.1)

## 2020-01-22 LAB — MAGNESIUM: Magnesium: 1.8 mg/dL (ref 1.5–2.5)

## 2020-01-22 LAB — TSH: TSH: 3.12 mIU/L (ref 0.40–4.50)

## 2020-01-25 ENCOUNTER — Other Ambulatory Visit: Payer: Self-pay

## 2020-01-25 ENCOUNTER — Ambulatory Visit: Payer: Medicare Other | Admitting: Cardiovascular Disease

## 2020-01-25 ENCOUNTER — Encounter: Payer: Self-pay | Admitting: Cardiology

## 2020-01-25 ENCOUNTER — Ambulatory Visit (INDEPENDENT_AMBULATORY_CARE_PROVIDER_SITE_OTHER): Payer: Medicare Other | Admitting: Cardiology

## 2020-01-25 VITALS — BP 144/84 | HR 89 | Ht 65.0 in | Wt 228.0 lb

## 2020-01-25 DIAGNOSIS — I441 Atrioventricular block, second degree: Secondary | ICD-10-CM

## 2020-01-25 DIAGNOSIS — E782 Mixed hyperlipidemia: Secondary | ICD-10-CM | POA: Diagnosis not present

## 2020-01-25 DIAGNOSIS — I5032 Chronic diastolic (congestive) heart failure: Secondary | ICD-10-CM | POA: Diagnosis not present

## 2020-01-25 DIAGNOSIS — R0602 Shortness of breath: Secondary | ICD-10-CM

## 2020-01-25 NOTE — Progress Notes (Signed)
Cardiology Office Note  Date: 01/25/2020   ID: [name], [name] [birth year], MRN [id number]  PCP:  Alycia Rossetti, MD  Cardiologist:  Rozann Lesches, MD Electrophysiologist:  [name], MD   Chief Complaint  Patient presents with  . Cardiac follow-up    History of Present Illness: Julie Clay is a 79 y.o. female former patient Dr. Bronson Ing now presenting to establish follow-up with me.  I reviewed her records and updated the chart.  She was last seen in April by Ms. Strader PA-C.  She is here today with her significant other.  Describes dyspnea on exertion, no chest pain or palpitations.  She uses Demadex 10 mg tablets approximately once every week, adjusts dosing based on weight change.  Her current weight is down compared to after recent surgery. She recently underwent a right partial mastectomy for treatment of breast cancer in September.  She follows with Dr. Caryl Comes, [place] pacemaker in place with history of symptomatic second-degree heart block.  She was seen recently in the EP clinic in early October.  I reviewed her recent lab work which is outlined below, we discussed these results today.  We also went over her medications which are outlined below.  Her last echocardiogram was in 2019.  Past Medical History:  Diagnosis Date  . Arthritis   . Asthma   . Bone spur   . Breast cancer, left breast ([place])   . Breast cancer, right breast ([place])   . Chronic bronchitis ([place])   . COPD (chronic obstructive pulmonary disease) ([place])   . Diastolic heart failure ([place])   . Essential hypertension   . Fatty liver 2013  . GERD (gastroesophageal reflux disease)   . HSV (herpes simplex virus) infection   . Hyperlipidemia   . Lung nodule seen on imaging study 2013  . Lymphedema    Left arm  . 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 Osteoarthritis   . Type II diabetes mellitus ([place])   . Ventral hernia     Past Surgical History:  Procedure Laterality Date  .  BREAST BIOPSY Left 04/2011  . BREAST BIOPSY Left 06/22/13  . BREAST BIOPSY Left 09/12/2013   Procedure: BREAST BIOPSY WITH NEEDLE LOCALIZATION;  Surgeon: Jamesetta So, MD;  Location: [facility];  Service: General;  Laterality: Left;  . BREAST LUMPECTOMY Left 04/2011 X 2  . BREAST SURGERY N/A    Phreesia 01/17/2020  . CATARACT EXTRACTION W/ INTRAOCULAR LENS  IMPLANT, BILATERAL Bilateral   . CATARACT EXTRACTION, BILATERAL     on different occassions   . [place]  . COLON SURGERY N/A    Phreesia 01/17/2020  . DILATION AND CURETTAGE OF UTERUS  11/06/2013  . HAND LIGAMENT RECONSTRUCTION Right ~ 2010  . HYSTEROSCOPY WITH D & C N/A 11/06/2013   Procedure: DILATATION AND CURETTAGE /HYSTEROSCOPY;  Surgeon: Jonnie Kind, MD;  Location: [facility];  Service: Gynecology;  Laterality: N/A;  . INSERT / REPLACE / REMOVE PACEMAKER  06/22/2017  . LUNG LOBECTOMY Right 1988   Fungal Infection  . PACEMAKER IMPLANT N/A 06/22/2017   Procedure: PACEMAKER IMPLANT;  Surgeon: Deboraha Sprang, MD;  Location: [facility];  Service: Cardiovascular;  Laterality: N/A;  . PARTIAL MASTECTOMY WITH NEEDLE LOCALIZATION Right 01/02/2020   Procedure: RIGHT PARTIAL MASTECTOMY AFTER NEEDLE LOCALIZATION;  Surgeon: Aviva Signs, MD;  Location: [facility];  Service: General;  Laterality: Right;  . POLYPECTOMY  N/A 11/06/2013   Procedure: POLYPECTOMY (REMOVAL ENDOMETRIAL POLYP);  Surgeon: Jonnie Kind, MD;  Location: [facility];  Service: Gynecology;  Laterality: N/A;  . RECTOCELE REPAIR N/A 11/06/2013   Procedure: POSTERIOR REPAIR (RECTOCELE);  Surgeon: Jonnie Kind, MD;  Location: [facility];  Service: Gynecology;  Laterality: N/A;  . SHOULDER ARTHROSCOPY WITH OPEN ROTATOR CUFF REPAIR Left 07/26/2012  . TONSILLECTOMY  1949  . TUBAL LIGATION      Current Outpatient Medications  Medication Sig Dispense Refill  . acetaminophen (TYLENOL) 500 MG tablet Take 1,000 mg by mouth every 6 (six) hours as needed for moderate pain.      Vanita Ingles Leaf POWD Take 800 mg by mouth 2 (two) times daily.     . Ascorbic Acid (VITAMIN C) 1000 MG tablet Take 1,000 mg by mouth 2 (two) times daily.    Marland Kitchen b complex vitamins tablet Take 1 tablet by mouth 2 (two) times daily.     . Blood Glucose Monitoring Suppl (BLOOD GLUCOSE SYSTEM PAK) KIT Use as directed to monitor FSBS 1x daily. Dx: E11.9. Please dispense as Accu-Chek Aviva 1 each 1  . Calcium Polycarbophil (FIBER-CAPS PO) Take by mouth every other day.    . Cholecalciferol (VITAMIN D3) 2000 UNITS TABS Take 2,000 Units by mouth 2 (two) times daily.    . fluocinonide cream (LIDEX) 1.24 % Apply 1 application topically 2 (two) times daily as needed (poison ivy/bites/hives). 30 g 11  . Ginkgo Biloba 120 MG CAPS Take 120 mg by mouth in the morning and at bedtime.     Marland Kitchen glipiZIDE (GLUCOTROL) 10 MG tablet TAKE 1/2 (ONE-HALF) TABLET BY MOUTH TWICE DAILY BEFORE A MEAL FOR DIABETES 180 tablet 0  . Glucose Blood (BLOOD GLUCOSE TEST STRIPS) STRP Use as directed to monitor FSBS 1x daily. Dx: E11.9. Please dispense as Accu-Chek Aviva 100 each 1  . Krill Oil 1000 MG CAPS Take 1,000 mg by mouth 2 (two) times daily.     . Lancets MISC Use as directed to monitor FSBS 1x daily. Dx: E11.9. Please dispense as Accu-Chek Aviva 100 each 1  . melatonin 5 MG TABS Take 5 mg by mouth at bedtime as needed (sleep).     . metFORMIN (GLUCOPHAGE) 1000 MG tablet TAKE 1 TABLET BY MOUTH TWICE DAILY WITH A MEAL FOR DIABETES 180 tablet 0  . metoprolol succinate (TOPROL-XL) 25 MG 24 hr tablet Take 0.5 tablets (12.5 mg total) by mouth daily. 45 tablet 3  . Multiple Vitamin (MULTIVITAMIN WITH MINERALS) TABS tablet Take 1 tablet by mouth daily.    . naftifine (NAFTIN) 1 % cream Apply topically 2 (two) times daily as needed. 60 g 2  . rosuvastatin (CRESTOR) 5 MG tablet TAKE 1 TABLET BY MOUTH ONCE DAILY FOR CHOLESTEROL 90 tablet 0  . simethicone (MYLICON) 80 MG chewable tablet Chew 80 mg by mouth as needed for flatulence.     Marland Kitchen  tiZANidine (ZANAFLEX) 4 MG tablet TAKE 1/2 (ONE-HALF) TABLET BY MOUTH EVERY 6 HOURS AS NEEDED FOR MUSCLE SPASM 30 tablet 2  . torsemide (DEMADEX) 20 MG tablet Take 10 mg by mouth daily as needed (swelling).     . TRULICITY 5.80 DX/8.3JA SOPN INJECT 1 SYRINGE SUBCUTANEOUSLY ONCE A WEEK 0.5 mL 2  . LORazepam (ATIVAN) 0.5 MG tablet Take 1 tablet (0.5 mg total) by mouth 2 (two) times daily as needed for anxiety. (Patient not taking: Reported on 01/18/2020) 30 tablet 1   No current facility-administered medications for this  visit.   Allergies:  Arimidex [anastrozole], Other, Adhesive [tape], Lisinopril, Tetanus toxoids, and Tetracyclines & related   ROS: Some soreness in her right breast status post recent surgery.  Physical Exam: VS:  BP (!) 144/84   Pulse 89   Ht 5' 5"  (1.651 m)   Wt 228 lb (103.4 kg)   SpO2 97%   BMI 37.94 kg/m , BMI Body mass index is 37.94 kg/m.  Wt Readings from Last 3 Encounters:  01/25/20 228 lb (103.4 kg)  01/18/20 232 lb (105.2 kg)  01/10/20 227 lb (103 kg)    General: Patient appears comfortable at rest. HEENT: Conjunctiva and lids normal, wearing a mask. Neck: Supple, no elevated JVP or carotid bruits, no thyromegaly. Lungs: Clear to auscultation, nonlabored breathing at rest. Cardiac: Regular rate and rhythm, no S3 or significant systolic murmur. Abdomen: Soft, nontender, bowel sounds present. Extremities: No pitting edema, distal pulses 2+.  ECG:  An ECG dated 07/31/2019 was personally reviewed today and demonstrated:  Ventricular pacing with atrial sensing.  Recent Labwork: 11/30/2019: Hemoglobin 12.9; Platelets 238 01/18/2020: ALT 13; AST 17; BUN 17; Creat 0.84; Magnesium 1.8; Potassium 4.5; Sodium 142; TSH 3.12     Component Value Date/Time   CHOL 138 11/30/2019 1032   TRIG 196 (H) 11/30/2019 1032   HDL 44 (L) 11/30/2019 1032   CHOLHDL 3.1 11/30/2019 1032   VLDL 43 (H) 09/20/2016 1015   LDLCALC 67 11/30/2019 1032   LDLDIRECT 102 (H) 12/09/2011  1035    Other Studies Reviewed Today:  Echocardiogram 06/15/2017: - Left ventricle: The cavity size was normal. Wall thickness was  increased in a pattern of mild LVH. Systolic function was normal.  The estimated ejection fraction was in the range of 60% to 65%.  Wall motion was normal; there were no regional wall motion  abnormalities. Doppler parameters are consistent with abnormal  left ventricular relaxation (grade 1 diastolic dysfunction).  - Aortic valve: Mildly calcified annulus. Trileaflet; mildly  calcified leaflets. Valve area (VTI): 2.28 cm^2.  - Mitral valve: Mildly calcified annulus. There was trivial  regurgitation.  - Right atrium: Central venous pressure (est): 3 mm Hg.  - Atrial septum: No defect or patent foramen ovale was identified.  - Tricuspid valve: There was trivial regurgitation.  - Pulmonary arteries: Systolic pressure could not be accurately  estimated.  - Pericardium, extracardiac: There was no pericardial effusion.   Assessment and Plan:  1.  History of diastolic heart failure per chart review, on very low-dose Demadex which she uses on average once a week, follows daily weights.  She has had fatigue on higher/standing doses.  Reports shortness of breath after her recent surgery, weight is relatively stable at this point however.  Last LVEF 60 to 65% as of 2019.  I did talk with her about a follow-up echocardiogram, she is still having some soreness in her right breast after surgery and would prefer to hold off for now, we will get this set up for her next visit.  I reviewed her recent lab work, TSH, renal function, LFTs normal.  Hemoglobin was 12.9 in August.  2.  Right breast cancer status post lumpectomy by Dr. Arnoldo Morale.  She is having some soreness, I asked her to contact surgical office to let them know about symptoms and arrange a follow-up examination.  She is otherwise scheduled to see Dr. Delton Coombes to discuss further treatment options,  does not anticipate radiation at this time.  3.  History of type II second-degree heart block  status post [employer]. Jude pacemaker.  She had a recent device interrogation in the EP clinic.  3.  Mixed hyperlipidemia, on Crestor.  Recent LDL 67.  Medication Adjustments/Labs and Tests Ordered: Current medicines are reviewed at length with the patient today.  Concerns regarding medicines are outlined above.   Tests Ordered: Orders Placed This Encounter  Procedures  . ECHOCARDIOGRAM COMPLETE    Medication Changes: No orders of the defined types were placed in this encounter.   Disposition:  Follow up 6 months in the [place] office.  Signed, Satira Sark, MD, [facility] 01/25/2020 2:15 PM    [facility] [address] [place], [place] [address] Phone: [phone]; Fax: [phone]

## 2020-01-25 NOTE — Patient Instructions (Signed)
Medication Instructions:  Your physician recommends that you continue on your current medications as directed. Please refer to the Current Medication list given to you today.  *If you need a refill on your cardiac medications before your next appointment, please call your pharmacy*   Lab Work: None today If you have labs (blood work) drawn today and your tests are completely normal, you will receive your results only by:  [place] (if you have MyChart) OR  A paper copy in the mail If you have any lab test that is abnormal or we need to change your treatment, we will call you to review the results.   Testing/Procedures: Your physician has requested that you have an echocardiogram in 6 months. Echocardiography is a painless test that uses sound waves to create images of your heart. It provides your doctor with information about the size and shape of your heart and how well your hearts chambers and valves are working. This procedure takes approximately one hour. There are no restrictions for this procedure.     Follow-Up: At [facility], you and your health needs are our priority.  As part of our continuing mission to provide you with exceptional heart care, we have created designated Provider Care Teams.  These Care Teams include your primary Cardiologist (physician) and Advanced Practice Providers (APPs -  Physician Assistants and Nurse Practitioners) who all work together to provide you with the care you need, when you need it.  We recommend signing up for the patient portal called "MyChart".  Sign up information is provided on this After Visit Summary.  MyChart is used to connect with patients for Virtual Visits (Telemedicine).  Patients are able to view lab/test results, encounter notes, upcoming appointments, etc.  Non-urgent messages can be sent to your provider as well.   To learn more about what you can do with MyChart, go to [email or web address].    Your next  appointment:   6 month(s)  The format for your next appointment:   In Person  Provider:   Rozann Lesches, MD   Other Instructions None     Thank you for choosing [facility] !

## 2020-02-05 ENCOUNTER — Other Ambulatory Visit: Payer: Self-pay | Admitting: *Deleted

## 2020-02-05 ENCOUNTER — Other Ambulatory Visit: Payer: Self-pay | Admitting: Family Medicine

## 2020-02-05 MED ORDER — METOPROLOL SUCCINATE ER 25 MG PO TB24
12.5000 mg | ORAL_TABLET | Freq: Every day | ORAL | 3 refills | Status: DC
Start: 2020-02-05 — End: 2020-02-08

## 2020-02-05 NOTE — Telephone Encounter (Signed)
Ok to refill 

## 2020-02-07 ENCOUNTER — Inpatient Hospital Stay (HOSPITAL_COMMUNITY): Payer: Medicare Other | Attending: Hematology | Admitting: Hematology

## 2020-02-07 VITALS — BP 120/64 | HR 94 | Temp 97.1°F | Resp 20 | Wt 229.8 lb

## 2020-02-07 DIAGNOSIS — Z170 Estrogen receptor positive status [ER+]: Secondary | ICD-10-CM | POA: Diagnosis not present

## 2020-02-07 DIAGNOSIS — Z79811 Long term (current) use of aromatase inhibitors: Secondary | ICD-10-CM | POA: Diagnosis not present

## 2020-02-07 DIAGNOSIS — E669 Obesity, unspecified: Secondary | ICD-10-CM | POA: Diagnosis not present

## 2020-02-07 DIAGNOSIS — K5900 Constipation, unspecified: Secondary | ICD-10-CM | POA: Diagnosis not present

## 2020-02-07 DIAGNOSIS — C50511 Malignant neoplasm of lower-outer quadrant of right female breast: Secondary | ICD-10-CM | POA: Diagnosis not present

## 2020-02-07 DIAGNOSIS — R197 Diarrhea, unspecified: Secondary | ICD-10-CM | POA: Insufficient documentation

## 2020-02-07 DIAGNOSIS — R5383 Other fatigue: Secondary | ICD-10-CM | POA: Diagnosis not present

## 2020-02-07 DIAGNOSIS — R059 Cough, unspecified: Secondary | ICD-10-CM | POA: Diagnosis not present

## 2020-02-07 DIAGNOSIS — R519 Headache, unspecified: Secondary | ICD-10-CM | POA: Insufficient documentation

## 2020-02-07 DIAGNOSIS — C50911 Malignant neoplasm of unspecified site of right female breast: Secondary | ICD-10-CM | POA: Diagnosis not present

## 2020-02-07 DIAGNOSIS — C50912 Malignant neoplasm of unspecified site of left female breast: Secondary | ICD-10-CM

## 2020-02-07 DIAGNOSIS — R0602 Shortness of breath: Secondary | ICD-10-CM | POA: Insufficient documentation

## 2020-02-07 DIAGNOSIS — Z79899 Other long term (current) drug therapy: Secondary | ICD-10-CM | POA: Diagnosis not present

## 2020-02-07 DIAGNOSIS — G479 Sleep disorder, unspecified: Secondary | ICD-10-CM | POA: Diagnosis not present

## 2020-02-07 DIAGNOSIS — Z888 Allergy status to other drugs, medicaments and biological substances status: Secondary | ICD-10-CM | POA: Diagnosis not present

## 2020-02-07 NOTE — Progress Notes (Signed)
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  D/C'd due to arthralgias and myalgias   04/19/2012 - 10/2016 Chemotherapy   Tamoxifen      CHIEF COMPLIANT: Follow up for bilateral breast cancer   INTERVAL HISTORY: Julie Clay is a 79 y.o. female here today for follow up of her bilateral breast cancer. Her last visit was on 12/18/2019.   Today she reports feeling well. She tolerated the lumpectomy on 9/15 well and takes APAP as needed. She denies having any breast pain. She is not interested in antiestrogen meds since she already has arthritis in multiple joints. She continues having SOB when she is active or walks.  She received her flu shot in September.  REVIEW OF SYSTEMS:   Review of Systems  Constitutional: Positive for fatigue (depleted). Negative for appetite change.  Respiratory: Positive for cough and shortness of breath (w/ exertion).   Cardiovascular: Negative for chest pain.  Gastrointestinal: Positive for constipation and diarrhea.  Neurological: Positive for headaches (occasional).  Psychiatric/Behavioral: Positive for sleep disturbance (occasional).  All other systems reviewed and are negative.   I have reviewed the past medical history, past surgical history, social history and family history with the patient and they are unchanged from previous note.   ALLERGIES:   is allergic to arimidex [anastrozole], other, adhesive [tape], lisinopril, tetanus toxoids, and tetracyclines & related.   MEDICATIONS:  Current Outpatient Medications    Medication Sig Dispense Refill   acetaminophen (TYLENOL) 500 MG tablet Take 1,000 mg by mouth every 6 (six) hours as needed for moderate pain.      Aloe Vera Leaf POWD Take 800 mg by mouth 2 (two) times daily.      Ascorbic Acid (VITAMIN C) 1000 MG tablet Take 1,000 mg by mouth 2 (two) times daily.     b complex vitamins tablet Take 1 tablet by mouth 2 (two) times daily.      Blood Glucose Monitoring Suppl (BLOOD GLUCOSE SYSTEM PAK) KIT Use as directed to monitor FSBS 1x daily. Dx: E11.9. Please dispense as Accu-Chek Aviva 1 each 1   Calcium Polycarbophil (FIBER-CAPS PO) Take by mouth every other day.     Cholecalciferol (VITAMIN D3) 2000 UNITS TABS Take 2,000 Units by mouth 2 (two) times daily.     fluocinonide cream (LIDEX) 6.04 % Apply 1 application topically 2 (two) times daily as needed (poison ivy/bites/hives). 30 g 11   Ginkgo Biloba 120 MG CAPS Take 120 mg by mouth in the morning and at bedtime.      glipiZIDE (GLUCOTROL) 10 MG tablet TAKE 1/2 (ONE-HALF) TABLET BY MOUTH TWICE DAILY BEFORE A MEAL FOR DIABETES 180 tablet 0   Glucose Blood (BLOOD GLUCOSE TEST STRIPS) STRP Use as directed to monitor FSBS 1x daily. Dx: E11.9. Please dispense as Accu-Chek Aviva 100 each 1   Krill Oil 1000 MG CAPS Take 1,000 mg by mouth 2 (two) times daily.      Lancets MISC Use as directed to monitor FSBS 1x daily. Dx: E11.9. Please dispense as Accu-Chek Aviva 100 each 1   LORazepam (ATIVAN) 0.5 MG  tablet Take 1 tablet (0.5 mg total) by mouth 2 (two) times daily as needed for anxiety. 30 tablet 1   melatonin 5 MG TABS Take 5 mg by mouth at bedtime as needed (sleep).      metFORMIN (GLUCOPHAGE) 1000 MG tablet TAKE 1 TABLET BY MOUTH TWICE DAILY WITH A MEAL FOR DIABETES 180 tablet 0   metoprolol succinate (TOPROL-XL) 25 MG 24 hr tablet Take 0.5 tablets (12.5 mg total) by mouth daily. 45 tablet 3   Multiple Vitamin (MULTIVITAMIN WITH MINERALS) TABS tablet Take 1 tablet by mouth daily.      naftifine (NAFTIN) 1 % cream Apply topically 2 (two) times daily as needed. 60 g 2   rosuvastatin (CRESTOR) 5 MG tablet TAKE 1 TABLET BY MOUTH ONCE DAILY FOR CHOLESTEROL 90 tablet 0   simethicone (MYLICON) 80 MG chewable tablet Chew 80 mg by mouth as needed for flatulence.      tiZANidine (ZANAFLEX) 4 MG tablet TAKE 1/2 (ONE-HALF) TABLET BY MOUTH EVERY 6 HOURS AS NEEDED FOR MUSCLE SPASM 30 tablet 0   torsemide (DEMADEX) 20 MG tablet Take 10 mg by mouth daily as needed (swelling).      TRULICITY 1.61 WR/6.0AV SOPN INJECT 1 SYRINGE SUBCUTANEOUSLY ONCE A WEEK 0.5 mL 2   No current facility-administered medications for this visit.     PHYSICAL EXAMINATION: Performance status (ECOG): 1 - Symptomatic but completely ambulatory  Vitals:   02/07/20 1545  BP: 120/64  Pulse: 94  Resp: 20  Temp: (!) 97.1 F (36.2 C)  SpO2: 97%   Wt Readings from Last 3 Encounters:  02/07/20 229 lb 12.8 oz (104.2 kg)  01/25/20 228 lb (103.4 kg)  01/18/20 232 lb (105.2 kg)   Physical Exam Vitals reviewed.  Constitutional:      Appearance: Normal appearance. She is obese.  Chest:     Breasts:        Right: No bleeding, inverted nipple, mass or tenderness.     Neurological:     General: No focal deficit present.     Mental Status: She is alert and oriented to person, place, and time.  Psychiatric:        Mood and Affect: Mood normal.        Behavior: Behavior normal.     Breast Exam Chaperone: [name], MD     LABORATORY DATA:  I have reviewed the data as listed CMP Latest Ref Rng & Units 01/18/2020 11/30/2019 10/12/2019  Glucose 65 - 99 mg/dL 80 107(H) 186(H)  BUN 7 - 25 mg/dL 17 21 18   Creatinine 0.60 - 0.93 mg/dL 0.84 0.87 0.76  Sodium 135 - 146 mmol/L 142 141 138  Potassium 3.5 - 5.3 mmol/L 4.5 4.5 4.2  Chloride 98 - 110 mmol/L 106 105 104  CO2 20 - 32 mmol/L 27 26 23   Calcium 8.6 - 10.4 mg/dL 10.0 9.5 9.2  Total Protein 6.1 - 8.1 g/dL 6.6 6.2 7.1  Total Bilirubin 0.2 - 1.2  mg/dL 0.4 0.5 0.6  Alkaline Phos 38 - 126 U/L - - 51  AST 10 - 35 U/L 17 15 22   ALT 6 - 29 U/L 13 12 18    No results found for: [id number] Lab Results  Component Value Date   WBC CANCELED 01/18/2020   HGB 12.9 11/30/2019   HCT 39.8 11/30/2019   MCV 83.1 11/30/2019   PLT 238 11/30/2019   NEUTROABS 5,742 11/30/2019    ASSESSMENT:  1. 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 -Anastrozole from March 2013 through December 2013, discontinued secondary to arthralgias and myalgias. -Took tamoxifen for 4 years from January 2014 through January 2018. -Left breast ADH and ALH, status post lumpectomy on 09/12/2013. -Mammogram of the right breast on 02/08/2017 showed new finding and biopsy was recommended. -Mammogram and ultrasound of the right breast done on 10/04/2017 showed BI-RADS Category 4 with indeterminate hyperdense mass without ultrasound correlate. Stereotactic biopsy was recommended and declined by patient. -Mammogram on 10/12/2019 shows BI-RADS Category 0 in the right breast. -Right breast additional views on 11/06/2019 shows 0.8 x 0.6 x 0.5 cm irregular hypoechoic mass in the right breast at the 6 to 7 o'clock position.  2.  Stage I right breast cancer: -Right breast biopsy on 11/28/2019 with IDC with DCIS.  IDC was positive for ER/PR 100%.  Ki-67 5%.  HER-2 negative. -Right lumpectomy shows 1.4 cm IDC, grade 2, clear margins.  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   PLAN:  1. Stage I left breast infiltrating ductal carcinoma, ER/PR positive, HER-2 negative: -Mammogram on 10/12/2019 did not show any evidence of recurrence in the left breast.  2.  Stage I (T1CNX) right breast cancer: -We reviewed the pathology report in detail. -We talked about adjuvant antiestrogen therapy with tamoxifen.  Previously she had trouble tolerating anastrozole.  However she was able to tolerate  tamoxifen for 4 years. -She does not want any radiation also. -As the benefit is minimal, we decided not to pursue antiestrogen therapy because of side effects. -We will schedule her for mammogram in June 2022. -I will see her back after the mammogram. -After that visit I will see her once a year after mammograms.  3.  Breast cancer therapy associated bone loss: -She will continue vitamin D and weightbearing exercises.   No orders of the defined types were placed in this encounter.  The patient has a good understanding of the overall plan. she agrees with it. she will call with any problems that may develop before the next visit here.    Derek Jack, MD [place] [phone]   I, [name], am acting as a scribe for Dr. Sanda Linger.  I, Derek Jack MD, have reviewed the above documentation for accuracy and completeness, and I agree with the above.

## 2020-02-07 NOTE — Patient Instructions (Signed)
[place] at [facility] Discharge Instructions  You were seen today by Dr. Delton Coombes. He went over your recent results and scan. You will be scheduled for a bilateral mammogram after October 11, 2020. Dr. Delton Coombes will see you back after the mammogram for labs and follow up.   Thank you for choosing [place] at [facility] to provide your oncology and hematology care.  To afford each patient quality time with our provider, please arrive at least 15 minutes before your scheduled appointment time.   If you have a lab appointment with the [facility] please come in thru the Main Entrance and check in at the main information desk  You need to re-schedule your appointment should you arrive 10 or more minutes late.  We strive to give you quality time with our providers, and arriving late affects you and other patients whose appointments are after yours.  Also, if you no show three or more times for appointments you may be dismissed from the clinic at the providers discretion.     Again, thank you for choosing [facility].  Our hope is that these requests will decrease the amount of time that you wait before being seen by our physicians.       _____________________________________________________________  Should you have questions after your visit to [facility], please contact our office at [phone] between the hours of 8:00 a.m. and 4:30 p.m.  Voicemails left after 4:00 p.m. will not be returned until the following business day.  For prescription refill requests, have your pharmacy contact our office and allow 72 hours.    Cancer Center Support Programs:   > Cancer Support Group  2nd Tuesday of the month 1pm-2pm, Journey Room

## 2020-02-08 ENCOUNTER — Other Ambulatory Visit: Payer: Self-pay | Admitting: *Deleted

## 2020-02-08 DIAGNOSIS — M545 Low back pain, unspecified: Secondary | ICD-10-CM | POA: Diagnosis not present

## 2020-02-08 MED ORDER — METOPROLOL SUCCINATE ER 25 MG PO TB24
12.5000 mg | ORAL_TABLET | Freq: Every day | ORAL | 3 refills | Status: DC
Start: 2020-02-08 — End: 2020-04-28

## 2020-02-19 ENCOUNTER — Other Ambulatory Visit: Payer: Self-pay

## 2020-02-19 ENCOUNTER — Encounter (HOSPITAL_COMMUNITY): Payer: Self-pay | Admitting: Physical Therapy

## 2020-02-19 ENCOUNTER — Ambulatory Visit (HOSPITAL_COMMUNITY): Payer: Medicare Other | Attending: Physician Assistant | Admitting: Physical Therapy

## 2020-02-19 DIAGNOSIS — R2689 Other abnormalities of gait and mobility: Secondary | ICD-10-CM

## 2020-02-19 DIAGNOSIS — M545 Low back pain, unspecified: Secondary | ICD-10-CM

## 2020-02-19 DIAGNOSIS — M6281 Muscle weakness (generalized): Secondary | ICD-10-CM | POA: Insufficient documentation

## 2020-02-19 DIAGNOSIS — R29898 Other symptoms and signs involving the musculoskeletal system: Secondary | ICD-10-CM

## 2020-02-19 NOTE — Therapy (Signed)
[facility] [place], [place], [address] Phone: [phone]   Fax:  [phone]  Physical Therapy Evaluation  Patient Details  Name: Julie Clay MRN: [id number] Date of Birth: 19-Dec-1940 Referring Provider (PT): Chriss Czar PA-C   Encounter Date: 02/19/2020   PT End of Session - 02/19/20 1129    Visit Number 1    Number of Visits 12    Date for PT Re-Evaluation 04/01/20    Authorization Type Primary: Medicare Secondary BCBS (No auth, vl 58)    Authorization - Visit Number 1    Authorization - Number of Visits 50    Progress Note Due on Visit 10    PT Start Time 1050    PT Stop Time 1125    PT Time Calculation (min) 35 min    Activity Tolerance Patient tolerated treatment well    Behavior During Therapy [facility] for tasks assessed/performed           Past Medical History:  Diagnosis Date  . Arthritis   . Asthma   . Bone spur   . Breast cancer, left breast ([place])   . Breast cancer, right breast ([place])   . Chronic bronchitis ([place])   . COPD (chronic obstructive pulmonary disease) ([place])   . Diastolic heart failure ([place])   . Essential hypertension   . Fatty liver 2013  . GERD (gastroesophageal reflux disease)   . HSV (herpes simplex virus) infection   . Hyperlipidemia   . Lung nodule seen on imaging study 2013  . Lymphedema    Left arm  . 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 Osteoarthritis   . Type II diabetes mellitus ([place])   . Ventral hernia     Past Surgical History:  Procedure Laterality Date  . BREAST BIOPSY Left 04/2011  . BREAST BIOPSY Left 06/22/13  . BREAST BIOPSY Left 09/12/2013   Procedure: BREAST BIOPSY WITH NEEDLE LOCALIZATION;  Surgeon: Jamesetta So, MD;  Location: [facility];  Service: General;  Laterality: Left;  . BREAST LUMPECTOMY Left 04/2011 X 2  . BREAST SURGERY N/A    Phreesia 01/17/2020  . CATARACT EXTRACTION W/ INTRAOCULAR LENS  IMPLANT, BILATERAL Bilateral   . CATARACT  EXTRACTION, BILATERAL     on different occassions   . [place]  . COLON SURGERY N/A    Phreesia 01/17/2020  . DILATION AND CURETTAGE OF UTERUS  11/06/2013  . HAND LIGAMENT RECONSTRUCTION Right ~ 2010  . HYSTEROSCOPY WITH D & C N/A 11/06/2013   Procedure: DILATATION AND CURETTAGE /HYSTEROSCOPY;  Surgeon: Jonnie Kind, MD;  Location: [facility];  Service: Gynecology;  Laterality: N/A;  . INSERT / REPLACE / REMOVE PACEMAKER  06/22/2017  . LUNG LOBECTOMY Right 1988   Fungal Infection  . PACEMAKER IMPLANT N/A 06/22/2017   Procedure: PACEMAKER IMPLANT;  Surgeon: Deboraha Sprang, MD;  Location: [facility] CV LAB;  Service: Cardiovascular;  Laterality: N/A;  . PARTIAL MASTECTOMY WITH NEEDLE LOCALIZATION Right 01/02/2020   Procedure: RIGHT PARTIAL MASTECTOMY AFTER NEEDLE LOCALIZATION;  Surgeon: Aviva Signs, MD;  Location: [facility];  Service: General;  Laterality: Right;  . POLYPECTOMY N/A 11/06/2013   Procedure: POLYPECTOMY (REMOVAL ENDOMETRIAL POLYP);  Surgeon: Jonnie Kind, MD;  Location: [facility];  Service: Gynecology;  Laterality: N/A;  . RECTOCELE REPAIR N/A 11/06/2013   Procedure: POSTERIOR REPAIR (RECTOCELE);  Surgeon: Jonnie Kind, MD;  Location: [facility];  Service:  Gynecology;  Laterality: N/A;  . SHOULDER ARTHROSCOPY WITH OPEN ROTATOR CUFF REPAIR Left 07/26/2012  . TONSILLECTOMY  1949  . TUBAL LIGATION      There were no vitals filed for this visit.    Subjective Assessment - 02/19/20 1057    Subjective Patient is a 79 y.o. female who presents to physical therapy with c/o chronic back pain and postural deficits. Patient states she has a pacemaker. September 15th she had a lumpectomy. Her lower spine is twisted. She can walk further than she can stand. She does most ADL while sitting. She is still on prednisone and started taking about 10 days ago. Sitting decreases pain. Standing and walking increase pain but standing still is worse than walking. She also has an endurance  problem with walking. Patient states her main goal is to improve endurance and decrease back pain.    Limitations Standing;Walking;House hold activities;Lifting    How long can you stand comfortably? 1 min    How long can you walk comfortably? 2 min    Patient Stated Goals improve endurance and decrease back pain.    Currently in Pain? No/denies   worst 0-1/10 at worst since beginning prednisone             [facility] PT Assessment - 02/19/20 0001      Assessment   Medical Diagnosis LBP/ Posture    Referring Provider (PT) Chriss Czar PA-C    Onset Date/Surgical Date 02/18/14    Next MD Visit none scheduled    Prior Therapy yes      Precautions   Precautions None      Restrictions   Weight Bearing Restrictions No      Balance Screen   Has the patient fallen in the past 6 months No    Has the patient had a decrease in activity level because of a fear of falling?  No    Is the patient reluctant to leave their home because of a fear of falling?  No      Prior Function   Level of Independence Independent with basic ADLs    Vocation Retired    [employer] Retired - worked at [employer]   Overall Cognitive Status Within [employer] for tasks assessed      Observation/Other Assessments   Observations Ambulates without AD    Focus on Therapeutic Outcomes (FOTO)  41% limited      Sensation   Light Touch Appears Intact      Posture/Postural Control   Posture/Postural Control Postural limitations    Postural Limitations Rounded Shoulders;Forward head;Decreased lumbar lordosis      ROM / Strength   AROM / PROM / Strength AROM;Strength      AROM   AROM Assessment Site Lumbar    Lumbar Flexion 25% limited    Lumbar Extension 50% limited    Lumbar - Right Side Bend 25% limited    Lumbar - Left Side Bend 25% limited    Lumbar - Right Rotation 0% limited    Lumbar - Left Rotation 0% limited      Strength   Strength Assessment Site  Hip;Knee;Ankle    Right/Left Hip Right;Left    Right Hip Flexion 4-/5    Left Hip Flexion 3+/5    Right/Left Knee Right;Left    Right Knee Flexion 5/5    Right Knee Extension 5/5    Left Knee Flexion 5/5    Left Knee Extension 5/5  Right/Left Ankle Right;Left    Right Ankle Dorsiflexion 5/5    Left Ankle Dorsiflexion 5/5      Palpation   Palpation comment decreased lumbar lordosis      Transfers   Five time sit to stand comments  15.58 seconds, use of momentum, no use of hands      Ambulation/Gait   Ambulation/Gait Yes    Ambulation/Gait Assistance 7: Independent    Ambulation Distance (Feet) 360 Feet    Assistive device None    Gait Pattern Trunk flexed    Ambulation Surface Level;Indoor    Gait velocity decreased    Gait Comments 2 MWT, slow, labored , increasing SOB                      Objective measurements completed on examination: See above findings.       [facility] Adult PT Treatment/Exercise - 02/19/20 0001      Exercises   Exercises Lumbar      Lumbar Exercises: Seated   Sit to Stand 5 reps    Sit to Stand Limitations 2 sets                  PT Education - 02/19/20 1049    Education Details Patient educated on exam findings, POC, scope of PT, initial HEP    Person(s) Educated Patient    Methods Explanation;Demonstration;Handout    Comprehension Verbalized understanding;Returned demonstration            PT Short Term Goals - 02/19/20 1226      PT SHORT TERM GOAL #1   Title Patient will be independent with HEP in order to improve functional outcomes.    Time 3    Period Weeks    Status New    Target Date 03/11/20      PT SHORT TERM GOAL #2   Title Patient will report at least 25% improvement in symptoms for improved quality of life.    Time 3    Period Weeks    Status New    Target Date 03/11/20             PT Long Term Goals - 02/19/20 1227      PT LONG TERM GOAL #1   Title Patient will report at least 75%  improvement in symptoms for improved quality of life.    Time 6    Period Weeks    Status New    Target Date 04/01/20      PT LONG TERM GOAL #2   Title Patient will improve FOTO score by at least 5 points in order to indicate improved tolerance to activity.    Time 6    Period Weeks    Status New    Target Date 04/01/20      PT LONG TERM GOAL #3   Title Patient will be able to complete 5x STS in under 11.4 seconds in order to reduce the risk of falls.    Time 6    Period Weeks    Status New    Target Date 04/01/20      PT LONG TERM GOAL #4   Title Patient will be able to ambulate for at least 6 minutes with pain no greater than 1/10 in order to demonstrate improved ability to ambulate in the community.    Time 6    Period Weeks    Status New    Target Date 04/01/20  Plan - 02/19/20 1128    Clinical Impression Statement Patient is a 79 y.o. female who presents to physical therapy with c/o chronic back pain and postural deficits and decreased endurance. She presents with pain limited deficits in lumbar and LE strength, ROM, endurance, postural impairments, gait, balance, endurance, spinal mobility and functional mobility with ADL. She is having to modify and restrict ADL as indicated by FOTO score as well as subjective information and objective measures which is affecting overall participation. Patient will benefit from skilled physical therapy in order to improve function and reduce impairment.    Personal Factors and Comorbidities Comorbidity 3+;Fitness;Age;Past/Current Experience;Time since onset of injury/illness/exacerbation    Comorbidities Allergies, Arthritis, Asthma, Back pain, BMI over 30, Cancer, Chronic ObstructivePulmonary Disease, Diabetes Type  II, Gastrointestinal Disease, High Blood Pressure, Incontinence,Pacemaker,    Examination-Activity Limitations Locomotion Level;Transfers;Stand;Stairs;Squat;Lift;Bend    Examination-Participation  Restrictions Cleaning;Meal Prep;Church;Community Activity;Shop;Volunteer;Dorita Sciara    Stability/Clinical Decision Making Evolving/Moderate complexity    Clinical Decision Making Moderate    Rehab Potential Fair    PT Frequency 2x / week    PT Duration 6 weeks    PT Treatment/Interventions ADLs/Self Care Home Management;Aquatic Therapy;Canalith Repostioning;Cryotherapy;Electrical Stimulation;Iontophoresis 4mg /ml Dexamethasone;Traction;Moist Heat;Ultrasound;DME Instruction;Gait training;Stair training;Functional mobility training;Therapeutic exercise;Balance training;Therapeutic activities;Neuromuscular re-education;Patient/family education;Orthotic Fit/Training;Manual techniques;Manual lymph drainage;Compression bandaging;Passive range of motion;Dry needling;Energy conservation;Splinting;Taping;Spinal Manipulations;Joint Manipulations;Vasopneumatic Device    PT Next Visit Plan begin LE strengthing and progress as tolerated, start with sitting/table exercises and perform standing exercises as able, possibly begin core strengthing and lumbar mobility, postural strengthing    PT Home Exercise Plan 11/2 STS    Consulted and Agree with Plan of Care Patient           Patient will benefit from skilled therapeutic intervention in order to improve the following deficits and impairments:  Abnormal gait, Difficulty walking, Decreased range of motion, Decreased endurance, Pain, Decreased activity tolerance, Decreased balance, Improper body mechanics, Impaired flexibility, Postural dysfunction, Decreased strength, Decreased mobility  Visit Diagnosis: Low back pain, unspecified back pain laterality, unspecified chronicity, unspecified whether sciatica present  Muscle weakness (generalized)  Other abnormalities of gait and mobility  Other symptoms and signs involving the musculoskeletal system     Problem List Patient Active Problem List   Diagnosis Date Noted  . Malignant neoplasm of central  portion of right female breast ([place])   . Chronic constipation 12/04/2019  . Pacemaker 07/13/2017  . 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/2017  . 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 Heart murmur 05/31/2016  . Primary osteoarthritis of first carpometacarpal joint of left hand 06/12/2015  . Fatty liver disease, nonalcoholic 52/77/8242  . Lymphedema 02/18/2015  . Leg cramps 08/02/2014  . Ganglion cyst of flexor tendon sheath of finger of left hand 08/02/2014  . Skin irritation 01/19/2014  . Post-operative state 11/14/2013  . Atypical ductal hyperplasia of left breast 07/12/2013  . Endometrial polyp 06/25/2013  . Rectocele 06/25/2013  . Diabetic neuropathy ([place]) 05/09/2013  . Pre-ulcerative calluses 05/09/2013  . Bladder prolapse, female, acquired 05/09/2013  . Pain, joint, multiple sites 03/26/2013  . 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 Insomnia 11/29/2012  . Hip pain 09/08/2012  . Rotator cuff syndrome of left shoulder 06/21/2012  . Peripheral edema 02/04/2012  . HSV (herpes simplex virus) infection 12/09/2011  . Dyspnea 11/15/2011  . 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 Essential hypertension, benign 10/11/2011  . DM (diabetes mellitus) ([place]) 10/11/2011  . OA (osteoarthritis) 10/11/2011  .  Class 2 obesity 10/11/2011  . Asthma 10/11/2011  . Hyperlipidemia 10/11/2011  . Chronic fatigue 10/11/2011  . OSA (obstructive sleep apnea) 10/11/2011    12:29 PM, 02/19/20 Mearl Latin PT, DPT Physical Therapist at [facility] [place], [place], [address] Phone: [phone]   Fax:  [phone]  Name: Julie Clay MRN: [id number] Date of Birth: 1940-06-16

## 2020-02-19 NOTE — Patient Instructions (Signed)
Access Code: [email or web address] URL: [email or web address] Date: 02/19/2020 Prepared by: Mitzi Hansen Conrad Zajkowski  Exercises Sit to Stand with Arms Crossed - 1 x daily - 7 x weekly - 3 sets - 5 reps

## 2020-02-22 ENCOUNTER — Ambulatory Visit (HOSPITAL_COMMUNITY): Payer: Medicare Other

## 2020-02-24 ENCOUNTER — Other Ambulatory Visit: Payer: Self-pay | Admitting: Family Medicine

## 2020-02-25 ENCOUNTER — Other Ambulatory Visit: Payer: Self-pay

## 2020-02-25 ENCOUNTER — Ambulatory Visit (HOSPITAL_COMMUNITY): Payer: Medicare Other | Admitting: Physical Therapy

## 2020-02-25 DIAGNOSIS — R29898 Other symptoms and signs involving the musculoskeletal system: Secondary | ICD-10-CM

## 2020-02-25 DIAGNOSIS — R2689 Other abnormalities of gait and mobility: Secondary | ICD-10-CM

## 2020-02-25 DIAGNOSIS — M545 Low back pain, unspecified: Secondary | ICD-10-CM | POA: Diagnosis not present

## 2020-02-25 DIAGNOSIS — M6281 Muscle weakness (generalized): Secondary | ICD-10-CM | POA: Diagnosis not present

## 2020-02-25 NOTE — Therapy (Signed)
[place], [place], [address] Phone: [phone]   Fax:  [phone]  Physical Therapy Treatment  Patient Details  Name: Julie Clay MRN: [id number] Date of Birth: Oct 04, 1940 Referring Provider (PT): Chriss Czar PA-C   Encounter Date: 02/25/2020   PT End of Session - 02/25/20 1223    Visit Number 2    Number of Visits 12    Date for PT Re-Evaluation 04/01/20    Authorization Type Primary: Medicare Secondary BCBS (No auth, vl 66)    Authorization - Visit Number 2    Authorization - Number of Visits 50    Progress Note Due on Visit 10    PT Start Time 1130    PT Stop Time 1210    PT Time Calculation (min) 40 min    Activity Tolerance Patient tolerated treatment well    Behavior During Therapy [facility] for tasks assessed/performed           Past Medical History:  Diagnosis Date  . Arthritis   . Asthma   . Bone spur   . Breast cancer, left breast ([place])   . Breast cancer, right breast ([place])   . Chronic bronchitis ([place])   . COPD (chronic obstructive pulmonary disease) ([place])   . Diastolic heart failure ([place])   . Essential hypertension   . Fatty liver 2013  . GERD (gastroesophageal reflux disease)   . HSV (herpes simplex virus) infection   . Hyperlipidemia   . Lung nodule seen on imaging study 2013  . Lymphedema    Left arm  . 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 Osteoarthritis   . Type II diabetes mellitus ([place])   . Ventral hernia     Past Surgical History:  Procedure Laterality Date  . BREAST BIOPSY Left 04/2011  . BREAST BIOPSY Left 06/22/13  . BREAST BIOPSY Left 09/12/2013   Procedure: BREAST BIOPSY WITH NEEDLE LOCALIZATION;  Surgeon: Jamesetta So, MD;  Location: [facility];  Service: General;  Laterality: Left;  . BREAST LUMPECTOMY Left 04/2011 X 2  . BREAST SURGERY N/A    Phreesia 01/17/2020  . CATARACT EXTRACTION W/ INTRAOCULAR LENS  IMPLANT, BILATERAL Bilateral   . CATARACT  EXTRACTION, BILATERAL     on different occassions   . [place]  . COLON SURGERY N/A    Phreesia 01/17/2020  . DILATION AND CURETTAGE OF UTERUS  11/06/2013  . HAND LIGAMENT RECONSTRUCTION Right ~ 2010  . HYSTEROSCOPY WITH D & C N/A 11/06/2013   Procedure: DILATATION AND CURETTAGE /HYSTEROSCOPY;  Surgeon: Jonnie Kind, MD;  Location: [facility];  Service: Gynecology;  Laterality: N/A;  . INSERT / REPLACE / REMOVE PACEMAKER  06/22/2017  . LUNG LOBECTOMY Right 1988   Fungal Infection  . PACEMAKER IMPLANT N/A 06/22/2017   Procedure: PACEMAKER IMPLANT;  Surgeon: Deboraha Sprang, MD;  Location: [place] [facility];  Service: Cardiovascular;  Laterality: N/A;  . PARTIAL MASTECTOMY WITH NEEDLE LOCALIZATION Right 01/02/2020   Procedure: RIGHT PARTIAL MASTECTOMY AFTER NEEDLE LOCALIZATION;  Surgeon: Aviva Signs, MD;  Location: [facility];  Service: General;  Laterality: Right;  . POLYPECTOMY N/A 11/06/2013   Procedure: POLYPECTOMY (REMOVAL ENDOMETRIAL POLYP);  Surgeon: Jonnie Kind, MD;  Location: [facility];  Service: Gynecology;  Laterality: N/A;  . RECTOCELE REPAIR N/A 11/06/2013   Procedure: POSTERIOR REPAIR (RECTOCELE);  Surgeon: Jonnie Kind, MD;  Location: [facility];  Service:  Gynecology;  Laterality: N/A;  . SHOULDER ARTHROSCOPY WITH OPEN ROTATOR CUFF REPAIR Left 07/26/2012  . TONSILLECTOMY  1949  . TUBAL LIGATION      There were no vitals filed for this visit.                      [facility] Adult PT Treatment/Exercise - 02/25/20 0001      Lumbar Exercises: Seated   Sit to Stand 10 reps    Sit to Stand Limitations no UE's      Lumbar Exercises: Supine   Bridge 5 reps;Limitations    Bridge Limitations 2 sets    Straight Leg Raise 5 reps;Limitations    Straight Leg Raises Limitations 2 sets      Lumbar Exercises: Sidelying   Clam Both;10 reps                  PT Education - 02/25/20 1226    Education Details Goal review, HEP and POC moving forward.   Importance of completing HEP for optimal benefits and carryover.    Person(s) Educated Patient    Methods Explanation    Comprehension Verbalized understanding            PT Short Term Goals - 02/25/20 1143      PT SHORT TERM GOAL #1   Title Patient will be independent with HEP in order to improve functional outcomes.    Time 3    Period Weeks    Status On-going    Target Date 03/11/20      PT SHORT TERM GOAL #2   Title Patient will report at least 25% improvement in symptoms for improved quality of life.    Time 3    Period Weeks    Status On-going    Target Date 03/11/20             PT Long Term Goals - 02/25/20 1144      PT LONG TERM GOAL #1   Title Patient will report at least 75% improvement in symptoms for improved quality of life.    Time 6    Period Weeks    Status On-going      PT LONG TERM GOAL #2   Title Patient will improve FOTO score by at least 5 points in order to indicate improved tolerance to activity.    Time 6    Period Weeks    Status On-going      PT LONG TERM GOAL #3   Title Patient will be able to complete 5x STS in under 11.4 seconds in order to reduce the risk of falls.    Time 6    Period Weeks    Status On-going      PT LONG TERM GOAL #4   Title Patient will be able to ambulate for at least 6 minutes with pain no greater than 1/10 in order to demonstrate improved ability to ambulate in the community.    Time 6    Period Weeks    Status On-going                 Plan - 02/25/20 1226    Clinical Impression Statement Reviewed goals and POC moving forward.  Pt admits to not completing her HEP and states she probably will not complete these regularly or do and other forms of exercise/walking.    Began with sit to stands with noted fatigue.  Added mat core and LE strengthening exercises. Pt  very deconditioned, only able to complete 5 reps of most activities before needing to rest.  Pt with shortness of breath needing cues for  proper breathing as well.    Personal Factors and Comorbidities Comorbidity 3+;Fitness;Age;Past/Current Experience;Time since onset of injury/illness/exacerbation    Comorbidities Allergies, Arthritis, Asthma, Back pain, BMI over 30, Cancer, Chronic ObstructivePulmonary Disease, Diabetes Type  II, Gastrointestinal Disease, High Blood Pressure, Incontinence,Pacemaker,    Examination-Activity Limitations Locomotion Level;Transfers;Stand;Stairs;Squat;Lift;Bend    Examination-Participation Restrictions Cleaning;Meal Prep;Church;Community Activity;Shop;Volunteer;Dorita Sciara    Stability/Clinical Decision Making Evolving/Moderate complexity    Rehab Potential Fair    PT Frequency 2x / week    PT Duration 6 weeks    PT Treatment/Interventions ADLs/Self Care Home Management;Aquatic Therapy;Canalith Repostioning;Cryotherapy;Electrical Stimulation;Iontophoresis 4mg /ml Dexamethasone;Traction;Moist Heat;Ultrasound;DME Instruction;Gait training;Stair training;Functional mobility training;Therapeutic exercise;Balance training;Therapeutic activities;Neuromuscular re-education;Patient/family education;Orthotic Fit/Training;Manual techniques;Manual lymph drainage;Compression bandaging;Passive range of motion;Dry needling;Energy conservation;Splinting;Taping;Spinal Manipulations;Joint Manipulations;Vasopneumatic Device    PT Next Visit Plan progress LE strengthing and progress as tolerated, start with sitting/table exercises and perform standing exercises as able.  Add nustep next session.    PT Home Exercise Plan 11/2 STS    Consulted and Agree with Plan of Care Patient           Patient will benefit from skilled therapeutic intervention in order to improve the following deficits and impairments:  Abnormal gait, Difficulty walking, Decreased range of motion, Decreased endurance, Pain, Decreased activity tolerance, Decreased balance, Improper body mechanics, Impaired flexibility, Postural dysfunction,  Decreased strength, Decreased mobility  Visit Diagnosis: Low back pain, unspecified back pain laterality, unspecified chronicity, unspecified whether sciatica present  Muscle weakness (generalized)  Other abnormalities of gait and mobility  Other symptoms and signs involving the musculoskeletal system     Problem List Patient Active Problem List   Diagnosis Date Noted  . 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 Chronic constipation 12/04/2019  . Pacemaker 07/13/2017  . 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/2017  . 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 Heart murmur 05/31/2016  . Primary osteoarthritis of first carpometacarpal joint of left hand 06/12/2015  . Fatty liver disease, nonalcoholic 61/44/3154  . Lymphedema 02/18/2015  . Leg cramps 08/02/2014  . Ganglion cyst of flexor tendon sheath of finger of left hand 08/02/2014  . Skin irritation 01/19/2014  . Post-operative state 11/14/2013  . Atypical ductal hyperplasia of left breast 07/12/2013  . Endometrial polyp 06/25/2013  . Rectocele 06/25/2013  . Diabetic neuropathy ([place]) 05/09/2013  . Pre-ulcerative calluses 05/09/2013  . Bladder prolapse, female, acquired 05/09/2013  . Pain, joint, multiple sites 03/26/2013  . 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 Insomnia 11/29/2012  . Hip pain 09/08/2012  . Rotator cuff syndrome of left shoulder 06/21/2012  . Peripheral edema 02/04/2012  . HSV (herpes simplex virus) infection 12/09/2011  . Dyspnea 11/15/2011  . 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 Essential hypertension, benign 10/11/2011  . DM (diabetes mellitus) ([place]) 10/11/2011  . OA (osteoarthritis) 10/11/2011  . Class 2 obesity 10/11/2011  . Asthma 10/11/2011  . Hyperlipidemia 10/11/2011  . Chronic fatigue 10/11/2011  . OSA (obstructive sleep apnea) 10/11/2011   Teena Irani, PTA/CLT [phone]  Teena Irani 02/25/2020, 12:28  PM  [place], [place], [address] Phone: [phone]   Fax:  [phone]  Name: KUMARI SCULLEY MRN: [id number] Date of Birth: 1940-12-12

## 2020-02-27 ENCOUNTER — Other Ambulatory Visit: Payer: Self-pay

## 2020-02-27 ENCOUNTER — Ambulatory Visit (HOSPITAL_COMMUNITY): Payer: Medicare Other | Admitting: Physical Therapy

## 2020-02-27 DIAGNOSIS — R2689 Other abnormalities of gait and mobility: Secondary | ICD-10-CM | POA: Diagnosis not present

## 2020-02-27 DIAGNOSIS — R29898 Other symptoms and signs involving the musculoskeletal system: Secondary | ICD-10-CM

## 2020-02-27 DIAGNOSIS — M6281 Muscle weakness (generalized): Secondary | ICD-10-CM

## 2020-02-27 DIAGNOSIS — M545 Low back pain, unspecified: Secondary | ICD-10-CM | POA: Diagnosis not present

## 2020-02-27 NOTE — Therapy (Signed)
[place], [place], [address] Phone: [phone]   Fax:  [phone]  Physical Therapy Treatment  Patient Details  Name: Julie Clay MRN: [id number] Date of Birth: 06/02/1940 Referring Provider (PT): Chriss Czar PA-C   Encounter Date: 02/27/2020   PT End of Session - 02/27/20 1625    Visit Number 3    Number of Visits 12    Date for PT Re-Evaluation 04/01/20    Authorization Type Primary: Medicare Secondary BCBS (No auth, vl 39)    Authorization - Visit Number 3    Authorization - Number of Visits 50    Progress Note Due on Visit 10    PT Start Time 1450    PT Stop Time 1535    PT Time Calculation (min) 45 min    Activity Tolerance Patient tolerated treatment well    Behavior During Therapy [facility] for tasks assessed/performed           Past Medical History:  Diagnosis Date  . Arthritis   . Asthma   . Bone spur   . Breast cancer, left breast ([place])   . Breast cancer, right breast ([place])   . Chronic bronchitis ([place])   . COPD (chronic obstructive pulmonary disease) ([place])   . Diastolic heart failure ([place])   . Essential hypertension   . Fatty liver 2013  . GERD (gastroesophageal reflux disease)   . HSV (herpes simplex virus) infection   . Hyperlipidemia   . Lung nodule seen on imaging study 2013  . Lymphedema    Left arm  . 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 Osteoarthritis   . Type II diabetes mellitus ([place])   . Ventral hernia     Past Surgical History:  Procedure Laterality Date  . BREAST BIOPSY Left 04/2011  . BREAST BIOPSY Left 06/22/13  . BREAST BIOPSY Left 09/12/2013   Procedure: BREAST BIOPSY WITH NEEDLE LOCALIZATION;  Surgeon: Jamesetta So, MD;  Location: [facility];  Service: General;  Laterality: Left;  . BREAST LUMPECTOMY Left 04/2011 X 2  . BREAST SURGERY N/A    Phreesia 01/17/2020  . CATARACT EXTRACTION W/ INTRAOCULAR LENS  IMPLANT, BILATERAL Bilateral   . CATARACT  EXTRACTION, BILATERAL     on different occassions   . [place]  . COLON SURGERY N/A    Phreesia 01/17/2020  . DILATION AND CURETTAGE OF UTERUS  11/06/2013  . HAND LIGAMENT RECONSTRUCTION Right ~ 2010  . HYSTEROSCOPY WITH D & C N/A 11/06/2013   Procedure: DILATATION AND CURETTAGE /HYSTEROSCOPY;  Surgeon: Jonnie Kind, MD;  Location: [facility];  Service: Gynecology;  Laterality: N/A;  . INSERT / REPLACE / REMOVE PACEMAKER  06/22/2017  . LUNG LOBECTOMY Right 1988   Fungal Infection  . PACEMAKER IMPLANT N/A 06/22/2017   Procedure: PACEMAKER IMPLANT;  Surgeon: Deboraha Sprang, MD;  Location: [facility];  Service: Cardiovascular;  Laterality: N/A;  . PARTIAL MASTECTOMY WITH NEEDLE LOCALIZATION Right 01/02/2020   Procedure: RIGHT PARTIAL MASTECTOMY AFTER NEEDLE LOCALIZATION;  Surgeon: Aviva Signs, MD;  Location: [facility];  Service: General;  Laterality: Right;  . POLYPECTOMY N/A 11/06/2013   Procedure: POLYPECTOMY (REMOVAL ENDOMETRIAL POLYP);  Surgeon: Jonnie Kind, MD;  Location: [facility];  Service: Gynecology;  Laterality: N/A;  . RECTOCELE REPAIR N/A 11/06/2013   Procedure: POSTERIOR REPAIR (RECTOCELE);  Surgeon: Jonnie Kind, MD;  Location: [facility];  Service:  Gynecology;  Laterality: N/A;  . SHOULDER ARTHROSCOPY WITH OPEN ROTATOR CUFF REPAIR Left 07/26/2012  . TONSILLECTOMY  1949  . TUBAL LIGATION      There were no vitals filed for this visit.   Subjective Assessment - 02/27/20 1455    Subjective pt states she is driving for 7 hours tomorrow to travel to be with her family for thanksgiving.  STates she will be there a week.  Reports she was up and down from noon to 5 cooking stuff to bring to it.  STates she has not done her exercises.    Currently in Pain? No/denies                             [facility] Adult PT Treatment/Exercise - 02/27/20 0001      Lumbar Exercises: Aerobic   Nustep UE/LE seat and UE's at 10; 10 minutes at  80-85 SPM level 1       Lumbar Exercises: Standing   Heel Raises 10 reps    Other Standing Lumbar Exercises hip abduction/extension 10 reps each      Lumbar Exercises: Seated   Sit to Stand 10 reps    Sit to Stand Limitations no UE's      Lumbar Exercises: Supine   Bridge 5 reps;Limitations    Bridge Limitations 3 sets     Straight Leg Raise 5 reps;Limitations    Straight Leg Raises Limitations 3 sets      Lumbar Exercises: Sidelying   Clam Both;10 reps    Clam Limitations 3 sets                    PT Short Term Goals - 02/25/20 1143      PT SHORT TERM GOAL #1   Title Patient will be independent with HEP in order to improve functional outcomes.    Time 3    Period Weeks    Status On-going    Target Date 03/11/20      PT SHORT TERM GOAL #2   Title Patient will report at least 25% improvement in symptoms for improved quality of life.    Time 3    Period Weeks    Status On-going    Target Date 03/11/20             PT Long Term Goals - 02/25/20 1144      PT LONG TERM GOAL #1   Title Patient will report at least 75% improvement in symptoms for improved quality of life.    Time 6    Period Weeks    Status On-going      PT LONG TERM GOAL #2   Title Patient will improve FOTO score by at least 5 points in order to indicate improved tolerance to activity.    Time 6    Period Weeks    Status On-going      PT LONG TERM GOAL #3   Title Patient will be able to complete 5x STS in under 11.4 seconds in order to reduce the risk of falls.    Time 6    Period Weeks    Status On-going      PT LONG TERM GOAL #4   Title Patient will be able to ambulate for at least 6 minutes with pain no greater than 1/10 in order to demonstrate improved ability to ambulate in the community.    Time 6    Period  Weeks    Status On-going                 Plan - 02/27/20 1626    Clinical Impression Statement Continued with established exercises.  Able to begin standing hip strengthening and  complete 3 sets of mat exercises.  PT required frequent rest breaks due to breathing/fatigue.  Added nustep at EOS with ability to complete full 10 minutes at 80-85 SPM.  Instructed to stop and stretch every 1-2 hours while driving and continue HEP while gone next week.    Personal Factors and Comorbidities Comorbidity 3+;Fitness;Age;Past/Current Experience;Time since onset of injury/illness/exacerbation    Comorbidities Allergies, Arthritis, Asthma, Back pain, BMI over 30, Cancer, Chronic ObstructivePulmonary Disease, Diabetes Type  II, Gastrointestinal Disease, High Blood Pressure, Incontinence,Pacemaker,    Examination-Activity Limitations Locomotion Level;Transfers;Stand;Stairs;Squat;Lift;Bend    Examination-Participation Restrictions Cleaning;Meal Prep;Church;Community Activity;Shop;Volunteer;Dorita Sciara    Stability/Clinical Decision Making Evolving/Moderate complexity    Rehab Potential Fair    PT Frequency 2x / week    PT Duration 6 weeks    PT Treatment/Interventions ADLs/Self Care Home Management;Aquatic Therapy;Canalith Repostioning;Cryotherapy;Electrical Stimulation;Iontophoresis 4mg /ml Dexamethasone;Traction;Moist Heat;Ultrasound;DME Instruction;Gait training;Stair training;Functional mobility training;Therapeutic exercise;Balance training;Therapeutic activities;Neuromuscular re-education;Patient/family education;Orthotic Fit/Training;Manual techniques;Manual lymph drainage;Compression bandaging;Passive range of motion;Dry needling;Energy conservation;Splinting;Taping;Spinal Manipulations;Joint Manipulations;Vasopneumatic Device    PT Next Visit Plan progress LE strengthing and progress as tolerated.  Progress to standing exercises as able    PT Home Exercise Plan 11/2 STS    Consulted and Agree with Plan of Care Patient           Patient will benefit from skilled therapeutic intervention in order to improve the following deficits and impairments:  Abnormal gait, Difficulty  walking, Decreased range of motion, Decreased endurance, Pain, Decreased activity tolerance, Decreased balance, Improper body mechanics, Impaired flexibility, Postural dysfunction, Decreased strength, Decreased mobility  Visit Diagnosis: Muscle weakness (generalized)  Other abnormalities of gait and mobility  Other symptoms and signs involving the musculoskeletal system  Low back pain, unspecified back pain laterality, unspecified chronicity, unspecified whether sciatica present     Problem List Patient Active Problem List   Diagnosis Date Noted  . 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 ([place])   . Chronic constipation 12/04/2019  . Pacemaker 07/13/2017  . 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/2017  . 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 Heart murmur 05/31/2016  . Primary osteoarthritis of first carpometacarpal joint of left hand 06/12/2015  . Fatty liver disease, nonalcoholic 40/98/1191  . Lymphedema 02/18/2015  . Leg cramps 08/02/2014  . Ganglion cyst of flexor tendon sheath of finger of left hand 08/02/2014  . Skin irritation 01/19/2014  . Post-operative state 11/14/2013  . Atypical ductal hyperplasia of left breast 07/12/2013  . Endometrial polyp 06/25/2013  . Rectocele 06/25/2013  . Diabetic neuropathy ([place]) 05/09/2013  . Pre-ulcerative calluses 05/09/2013  . Bladder prolapse, female, acquired 05/09/2013  . Pain, joint, multiple sites 03/26/2013  . 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 Insomnia 11/29/2012  . Hip pain 09/08/2012  . Rotator cuff syndrome of left shoulder 06/21/2012  . Peripheral edema 02/04/2012  . HSV (herpes simplex virus) infection 12/09/2011  . Dyspnea 11/15/2011  . 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 Essential hypertension, benign 10/11/2011  . DM (diabetes mellitus) ([place]) 10/11/2011  . OA (osteoarthritis) 10/11/2011  . Class 2 obesity 10/11/2011  . Asthma 10/11/2011  .  Hyperlipidemia 10/11/2011  . Chronic fatigue 10/11/2011  . OSA (obstructive sleep  apnea) 10/11/2011    Teena Irani 02/27/2020, 4:27 PM  [place] [address] [place], [place], [address] Phone: [phone]   Fax:  [phone]  Name: Julie Clay MRN: [id number] Date of Birth: 12-26-1940

## 2020-03-10 ENCOUNTER — Encounter (HOSPITAL_COMMUNITY): Payer: Self-pay | Admitting: Physical Therapy

## 2020-03-10 ENCOUNTER — Other Ambulatory Visit: Payer: Self-pay

## 2020-03-10 ENCOUNTER — Ambulatory Visit (HOSPITAL_COMMUNITY): Payer: Medicare Other | Admitting: Physical Therapy

## 2020-03-10 DIAGNOSIS — M6281 Muscle weakness (generalized): Secondary | ICD-10-CM

## 2020-03-10 DIAGNOSIS — R2689 Other abnormalities of gait and mobility: Secondary | ICD-10-CM | POA: Diagnosis not present

## 2020-03-10 DIAGNOSIS — M545 Low back pain, unspecified: Secondary | ICD-10-CM

## 2020-03-10 DIAGNOSIS — R29898 Other symptoms and signs involving the musculoskeletal system: Secondary | ICD-10-CM | POA: Diagnosis not present

## 2020-03-10 NOTE — Therapy (Signed)
[place], [place], [address] Phone: [phone]   Fax:  [phone]  Physical Therapy Treatment  Patient Details  Name: Julie Clay MRN: [id number] Date of Birth: 08-Oct-1940 Referring Provider (PT): Chriss Czar PA-C   Encounter Date: 03/10/2020   PT End of Session - 03/10/20 1534    Visit Number 4    Number of Visits 12    Date for PT Re-Evaluation 04/01/20    Authorization Type Primary: Medicare Secondary BCBS (No auth, vl 12)    Authorization - Visit Number 4    Authorization - Number of Visits 50    Progress Note Due on Visit 10    PT Start Time 1534    PT Stop Time 1615    PT Time Calculation (min) 41 min    Activity Tolerance Patient tolerated treatment well    Behavior During Therapy [facility] for tasks assessed/performed           Past Medical History:  Diagnosis Date  . Arthritis   . Asthma   . Bone spur   . Breast cancer, left breast ([place])   . Breast cancer, right breast ([place])   . Chronic bronchitis ([place])   . COPD (chronic obstructive pulmonary disease) ([place])   . Diastolic heart failure ([place])   . Essential hypertension   . Fatty liver 2013  . GERD (gastroesophageal reflux disease)   . HSV (herpes simplex virus) infection   . Hyperlipidemia   . Lung nodule seen on imaging study 2013  . Lymphedema    Left arm  . 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 Osteoarthritis   . Type II diabetes mellitus ([place])   . Ventral hernia     Past Surgical History:  Procedure Laterality Date  . BREAST BIOPSY Left 04/2011  . BREAST BIOPSY Left 06/22/13  . BREAST BIOPSY Left 09/12/2013   Procedure: BREAST BIOPSY WITH NEEDLE LOCALIZATION;  Surgeon: Jamesetta So, MD;  Location: [facility];  Service: General;  Laterality: Left;  . BREAST LUMPECTOMY Left 04/2011 X 2  . BREAST SURGERY N/A    Phreesia 01/17/2020  . CATARACT EXTRACTION W/ INTRAOCULAR LENS  IMPLANT, BILATERAL Bilateral   . CATARACT  EXTRACTION, BILATERAL     on different occassions   . [place]  . COLON SURGERY N/A    Phreesia 01/17/2020  . DILATION AND CURETTAGE OF UTERUS  11/06/2013  . HAND LIGAMENT RECONSTRUCTION Right ~ 2010  . HYSTEROSCOPY WITH D & C N/A 11/06/2013   Procedure: DILATATION AND CURETTAGE /HYSTEROSCOPY;  Surgeon: Jonnie Kind, MD;  Location: [facility];  Service: Gynecology;  Laterality: N/A;  . INSERT / REPLACE / REMOVE PACEMAKER  06/22/2017  . LUNG LOBECTOMY Right 1988   Fungal Infection  . PACEMAKER IMPLANT N/A 06/22/2017   Procedure: PACEMAKER IMPLANT;  Surgeon: Deboraha Sprang, MD;  Location: [place] [facility];  Service: Cardiovascular;  Laterality: N/A;  . PARTIAL MASTECTOMY WITH NEEDLE LOCALIZATION Right 01/02/2020   Procedure: RIGHT PARTIAL MASTECTOMY AFTER NEEDLE LOCALIZATION;  Surgeon: Aviva Signs, MD;  Location: [facility];  Service: General;  Laterality: Right;  . POLYPECTOMY N/A 11/06/2013   Procedure: POLYPECTOMY (REMOVAL ENDOMETRIAL POLYP);  Surgeon: Jonnie Kind, MD;  Location: [facility];  Service: Gynecology;  Laterality: N/A;  . RECTOCELE REPAIR N/A 11/06/2013   Procedure: POSTERIOR REPAIR (RECTOCELE);  Surgeon: Jonnie Kind, MD;  Location: [facility];  Service:  Gynecology;  Laterality: N/A;  . SHOULDER ARTHROSCOPY WITH OPEN ROTATOR CUFF REPAIR Left 07/26/2012  . TONSILLECTOMY  1949  . TUBAL LIGATION      There were no vitals filed for this visit.   Subjective Assessment - 03/10/20 1535    Subjective Patient went on her trip and her trip was good. She has not really been doing her exercises.    Currently in Pain? No/denies                             [facility] Adult PT Treatment/Exercise - 03/10/20 0001      Lumbar Exercises: Aerobic   Nustep UE/LE seat and UE's at 10; 10 minutes at  80-85 SPM level 1      Lumbar Exercises: Standing   Heel Raises 10 reps    Heel Raises Limitations TR 10 reps    Row Both;15 reps    Theraband Level (Row) Level 2 (Red)     Other Standing Lumbar Exercises hip abduction/extension 10 reps each    Other Standing Lumbar Exercises hamstring curls 1x 10 bilateral; marching 2x 10 bilateral; tandem stance 2x30 seconds each bilaterally; lateral stepping 4x 10 feet in parallel bars 2 x sets       Lumbar Exercises: Seated   Sit to Stand 10 reps    Sit to Stand Limitations no UE's                  PT Education - 03/10/20 1534    Education Details Patient educated on HEP, exercise mechanics    Person(s) Educated Patient    Methods Explanation;Demonstration    Comprehension Verbalized understanding;Returned demonstration            PT Short Term Goals - 02/25/20 1143      PT SHORT TERM GOAL #1   Title Patient will be independent with HEP in order to improve functional outcomes.    Time 3    Period Weeks    Status On-going    Target Date 03/11/20      PT SHORT TERM GOAL #2   Title Patient will report at least 25% improvement in symptoms for improved quality of life.    Time 3    Period Weeks    Status On-going    Target Date 03/11/20             PT Long Term Goals - 02/25/20 1144      PT LONG TERM GOAL #1   Title Patient will report at least 75% improvement in symptoms for improved quality of life.    Time 6    Period Weeks    Status On-going      PT LONG TERM GOAL #2   Title Patient will improve FOTO score by at least 5 points in order to indicate improved tolerance to activity.    Time 6    Period Weeks    Status On-going      PT LONG TERM GOAL #3   Title Patient will be able to complete 5x STS in under 11.4 seconds in order to reduce the risk of falls.    Time 6    Period Weeks    Status On-going      PT LONG TERM GOAL #4   Title Patient will be able to ambulate for at least 6 minutes with pain no greater than 1/10 in order to demonstrate improved ability to ambulate in the  community.    Time 6    Period Weeks    Status On-going                 Plan - 03/10/20  1535    Clinical Impression Statement Patient tolerates standing exercises well but does require several seated rest breaks during session. Patient becomes increasingly SOB with fatigue. Patient completes rows with ease but fatigues quickly requiring rest break following. Patient demonstrating impaired endurance and activity tolerance which is improving. She demonstrates minimal sway with tandem stance on level ground. Patient demonstrating improving motor control and LE strength with sit to stand exercise relying less on momentum. Patient completes nustep at end of session for improving cardiovascular fitness/endurance. Patient will continue to benefit from skilled physical therapy in order to reduce impairment and improve function.    Personal Factors and Comorbidities Comorbidity 3+;Fitness;Age;Past/Current Experience;Time since onset of injury/illness/exacerbation    Comorbidities Allergies, Arthritis, Asthma, Back pain, BMI over 30, Cancer, Chronic ObstructivePulmonary Disease, Diabetes Type  II, Gastrointestinal Disease, High Blood Pressure, Incontinence,Pacemaker,    Examination-Activity Limitations Locomotion Level;Transfers;Stand;Stairs;Squat;Lift;Bend    Examination-Participation Restrictions Cleaning;Meal Prep;Church;Community Activity;Shop;Volunteer;Dorita Sciara    Stability/Clinical Decision Making Evolving/Moderate complexity    Rehab Potential Fair    PT Frequency 2x / week    PT Duration 6 weeks    PT Treatment/Interventions ADLs/Self Care Home Management;Aquatic Therapy;Canalith Repostioning;Cryotherapy;Electrical Stimulation;Iontophoresis 4mg /ml Dexamethasone;Traction;Moist Heat;Ultrasound;DME Instruction;Gait training;Stair training;Functional mobility training;Therapeutic exercise;Balance training;Therapeutic activities;Neuromuscular re-education;Patient/family education;Orthotic Fit/Training;Manual techniques;Manual lymph drainage;Compression bandaging;Passive range of motion;Dry  needling;Energy conservation;Splinting;Taping;Spinal Manipulations;Joint Manipulations;Vasopneumatic Device    PT Next Visit Plan progress LE strengthing and progress as tolerated.  Progress to standing exercises as able    PT Home Exercise Plan 11/2 STS    Consulted and Agree with Plan of Care Patient           Patient will benefit from skilled therapeutic intervention in order to improve the following deficits and impairments:  Abnormal gait, Difficulty walking, Decreased range of motion, Decreased endurance, Pain, Decreased activity tolerance, Decreased balance, Improper body mechanics, Impaired flexibility, Postural dysfunction, Decreased strength, Decreased mobility  Visit Diagnosis: Muscle weakness (generalized)  Other abnormalities of gait and mobility  Other symptoms and signs involving the musculoskeletal system  Low back pain, unspecified back pain laterality, unspecified chronicity, unspecified whether sciatica present     Problem List Patient Active Problem List   Diagnosis Date Noted  . 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 Chronic constipation 12/04/2019  . Pacemaker 07/13/2017  . 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/2017  . 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 Heart murmur 05/31/2016  . Primary osteoarthritis of first carpometacarpal joint of left hand 06/12/2015  . Fatty liver disease, nonalcoholic 51/88/4166  . Lymphedema 02/18/2015  . Leg cramps 08/02/2014  . Ganglion cyst of flexor tendon sheath of finger of left hand 08/02/2014  . Skin irritation 01/19/2014  . Post-operative state 11/14/2013  . Atypical ductal hyperplasia of left breast 07/12/2013  . Endometrial polyp 06/25/2013  . Rectocele 06/25/2013  . Diabetic neuropathy ([place]) 05/09/2013  . Pre-ulcerative calluses 05/09/2013  . Bladder prolapse, female, acquired 05/09/2013  . Pain, joint, multiple sites 03/26/2013  . CKD (chronic kidney disease), stage II  03/26/2013  . Insomnia 11/29/2012  . Hip pain 09/08/2012  . Rotator cuff syndrome of left shoulder 06/21/2012  . Peripheral edema 02/04/2012  . HSV (herpes simplex virus) infection 12/09/2011  . Dyspnea 11/15/2011  . 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 Essential hypertension, benign 10/11/2011  . DM (diabetes mellitus) ([place]) 10/11/2011  . OA (osteoarthritis) 10/11/2011  . Class 2 obesity 10/11/2011  . Asthma 10/11/2011  . Hyperlipidemia 10/11/2011  . Chronic fatigue 10/11/2011  . OSA (obstructive sleep apnea) 10/11/2011    4:09 PM, 03/10/20 Mearl Latin PT, DPT Physical Therapist at [place], [place], [address] Phone: [phone]   Fax:  [phone]  Name: Julie Clay MRN: [id number] Date of Birth: 1941-04-14

## 2020-03-12 ENCOUNTER — Ambulatory Visit (HOSPITAL_COMMUNITY): Payer: Medicare Other | Admitting: Physical Therapy

## 2020-03-12 ENCOUNTER — Encounter (HOSPITAL_COMMUNITY): Payer: Self-pay | Admitting: Physical Therapy

## 2020-03-12 ENCOUNTER — Other Ambulatory Visit: Payer: Self-pay

## 2020-03-12 DIAGNOSIS — R29898 Other symptoms and signs involving the musculoskeletal system: Secondary | ICD-10-CM | POA: Diagnosis not present

## 2020-03-12 DIAGNOSIS — M6281 Muscle weakness (generalized): Secondary | ICD-10-CM

## 2020-03-12 DIAGNOSIS — M545 Low back pain, unspecified: Secondary | ICD-10-CM

## 2020-03-12 DIAGNOSIS — R2689 Other abnormalities of gait and mobility: Secondary | ICD-10-CM

## 2020-03-12 NOTE — Therapy (Signed)
[place], [place], [address] Phone: [phone]   Fax:  [phone]  Physical Therapy Treatment  Patient Details  Name: Julie Clay MRN: [id number] Date of Birth: [birth year] Referring Provider (PT): Chriss Czar PA-C   Encounter Date: 03/12/2020   PT End of Session - 03/12/20 1531    Visit Number 5    Number of Visits 12    Date for PT Re-Evaluation 04/01/20    Authorization Type Primary: Medicare Secondary BCBS (No auth, vl 15)    Authorization - Visit Number 5    Authorization - Number of Visits 50    Progress Note Due on Visit 10    PT Start Time 0102    PT Stop Time 1615    PT Time Calculation (min) 42 min    Activity Tolerance Patient tolerated treatment well    Behavior During Therapy [facility] for tasks assessed/performed           Past Medical History:  Diagnosis Date  . Arthritis   . Asthma   . Bone spur   . Breast cancer, left breast ([place])   . Breast cancer, right breast ([place])   . Chronic bronchitis ([place])   . COPD (chronic obstructive pulmonary disease) ([place])   . Diastolic heart failure ([place])   . Essential hypertension   . Fatty liver 2013  . GERD (gastroesophageal reflux disease)   . HSV (herpes simplex virus) infection   . Hyperlipidemia   . Lung nodule seen on imaging study 2013  . Lymphedema    Left arm  . 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 Osteoarthritis   . Type II diabetes mellitus ([place])   . Ventral hernia     Past Surgical History:  Procedure Laterality Date  . BREAST BIOPSY Left 04/2011  . BREAST BIOPSY Left 06/22/13  . BREAST BIOPSY Left 09/12/2013   Procedure: BREAST BIOPSY WITH NEEDLE LOCALIZATION;  Surgeon: Jamesetta So, MD;  Location: [facility];  Service: General;  Laterality: Left;  . BREAST LUMPECTOMY Left 04/2011 X 2  . BREAST SURGERY N/A    Phreesia 01/17/2020  . CATARACT EXTRACTION W/ INTRAOCULAR LENS  IMPLANT, BILATERAL Bilateral   . CATARACT  EXTRACTION, BILATERAL     on different occassions   . [place]  . COLON SURGERY N/A    Phreesia 01/17/2020  . DILATION AND CURETTAGE OF UTERUS  11/06/2013  . HAND LIGAMENT RECONSTRUCTION Right ~ 2010  . HYSTEROSCOPY WITH D & C N/A 11/06/2013   Procedure: DILATATION AND CURETTAGE /HYSTEROSCOPY;  Surgeon: Jonnie Kind, MD;  Location: [facility];  Service: Gynecology;  Laterality: N/A;  . INSERT / REPLACE / REMOVE PACEMAKER  06/22/2017  . LUNG LOBECTOMY Right 1988   Fungal Infection  . PACEMAKER IMPLANT N/A 06/22/2017   Procedure: PACEMAKER IMPLANT;  Surgeon: Deboraha Sprang, MD;  Location: [place] CV LAB;  Service: Cardiovascular;  Laterality: N/A;  . PARTIAL MASTECTOMY WITH NEEDLE LOCALIZATION Right 01/02/2020   Procedure: RIGHT PARTIAL MASTECTOMY AFTER NEEDLE LOCALIZATION;  Surgeon: Aviva Signs, MD;  Location: [facility];  Service: General;  Laterality: Right;  . POLYPECTOMY N/A 11/06/2013   Procedure: POLYPECTOMY (REMOVAL ENDOMETRIAL POLYP);  Surgeon: Jonnie Kind, MD;  Location: [facility];  Service: Gynecology;  Laterality: N/A;  . RECTOCELE REPAIR N/A 11/06/2013   Procedure: POSTERIOR REPAIR (RECTOCELE);  Surgeon: Jonnie Kind, MD;  Location: [facility];  Service:  Gynecology;  Laterality: N/A;  . SHOULDER ARTHROSCOPY WITH OPEN ROTATOR CUFF REPAIR Left 07/26/2012  . TONSILLECTOMY  1949  . TUBAL LIGATION      There were no vitals filed for this visit.   Subjective Assessment - 03/12/20 1533    Subjective Patient states she is sore. Her back was aching in a different spot. She has not been doing her exercises at home.    Currently in Pain? No/denies                             [facility] Adult PT Treatment/Exercise - 03/12/20 0001      Lumbar Exercises: Aerobic   Nustep UE/LE seat and UE's at 10; 10 minutes at  80-85 SPM level 1      Lumbar Exercises: Standing   Other Standing Lumbar Exercises forward and lateral step up 2x 10 bilateral each, marching 2x 10  bilateral       Lumbar Exercises: Seated   Sit to Stand 5 reps    Sit to Stand Limitations 3 sets without UE                  PT Education - 03/12/20 1532    Education Details Patient educated on HEP, exercise mechanics    Person(s) Educated Patient    Methods Explanation;Demonstration    Comprehension Verbalized understanding;Returned demonstration            PT Short Term Goals - 02/25/20 1143      PT SHORT TERM GOAL #1   Title Patient will be independent with HEP in order to improve functional outcomes.    Time 3    Period Weeks    Status On-going    Target Date 03/11/20      PT SHORT TERM GOAL #2   Title Patient will report at least 25% improvement in symptoms for improved quality of life.    Time 3    Period Weeks    Status On-going    Target Date 03/11/20             PT Long Term Goals - 02/25/20 1144      PT LONG TERM GOAL #1   Title Patient will report at least 75% improvement in symptoms for improved quality of life.    Time 6    Period Weeks    Status On-going      PT LONG TERM GOAL #2   Title Patient will improve FOTO score by at least 5 points in order to indicate improved tolerance to activity.    Time 6    Period Weeks    Status On-going      PT LONG TERM GOAL #3   Title Patient will be able to complete 5x STS in under 11.4 seconds in order to reduce the risk of falls.    Time 6    Period Weeks    Status On-going      PT LONG TERM GOAL #4   Title Patient will be able to ambulate for at least 6 minutes with pain no greater than 1/10 in order to demonstrate improved ability to ambulate in the community.    Time 6    Period Weeks    Status On-going                 Plan - 03/12/20 1532    Clinical Impression Statement Began session with nu step for dynamic warm  up and improving endurance and activity tolerance with cueing for maintaining RPM. Patient completes step up but requires UE support for balance and weakness. She  fatigues quickly with steps today. She requires frequent rest breaks during session secondary to fatigue and SOB. She is able to perform increased reps of STS. Patient will continue to benefit from skilled physical therapy in order to reduce impairment and improve function    Personal Factors and Comorbidities Comorbidity 3+;Fitness;Age;Past/Current Experience;Time since onset of injury/illness/exacerbation    Comorbidities Allergies, Arthritis, Asthma, Back pain, BMI over 30, Cancer, Chronic ObstructivePulmonary Disease, Diabetes Type  II, Gastrointestinal Disease, High Blood Pressure, Incontinence,Pacemaker,    Examination-Activity Limitations Locomotion Level;Transfers;Stand;Stairs;Squat;Lift;Bend    Examination-Participation Restrictions Cleaning;Meal Prep;Church;Community Activity;Shop;Volunteer;Dorita Sciara    Stability/Clinical Decision Making Evolving/Moderate complexity    Rehab Potential Fair    PT Frequency 2x / week    PT Duration 6 weeks    PT Treatment/Interventions ADLs/Self Care Home Management;Aquatic Therapy;Canalith Repostioning;Cryotherapy;Electrical Stimulation;Iontophoresis 4mg /ml Dexamethasone;Traction;Moist Heat;Ultrasound;DME Instruction;Gait training;Stair training;Functional mobility training;Therapeutic exercise;Balance training;Therapeutic activities;Neuromuscular re-education;Patient/family education;Orthotic Fit/Training;Manual techniques;Manual lymph drainage;Compression bandaging;Passive range of motion;Dry needling;Energy conservation;Splinting;Taping;Spinal Manipulations;Joint Manipulations;Vasopneumatic Device    PT Next Visit Plan progress LE strengthing and progress as tolerated.  Progress to standing exercises as able    PT Home Exercise Plan 11/2 STS    Consulted and Agree with Plan of Care Patient           Patient will benefit from skilled therapeutic intervention in order to improve the following deficits and impairments:  Abnormal gait, Difficulty  walking, Decreased range of motion, Decreased endurance, Pain, Decreased activity tolerance, Decreased balance, Improper body mechanics, Impaired flexibility, Postural dysfunction, Decreased strength, Decreased mobility  Visit Diagnosis: Muscle weakness (generalized)  Other abnormalities of gait and mobility  Other symptoms and signs involving the musculoskeletal system  Low back pain, unspecified back pain laterality, unspecified chronicity, unspecified whether sciatica present     Problem List Patient Active Problem List   Diagnosis Date Noted  . 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 ([place])   . Chronic constipation 12/04/2019  . Pacemaker 07/13/2017  . 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/2017  . 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 Heart murmur 05/31/2016  . Primary osteoarthritis of first carpometacarpal joint of left hand 06/12/2015  . Fatty liver disease, nonalcoholic 34/74/2595  . Lymphedema 02/18/2015  . Leg cramps 08/02/2014  . Ganglion cyst of flexor tendon sheath of finger of left hand 08/02/2014  . Skin irritation 01/19/2014  . Post-operative state 11/14/2013  . Atypical ductal hyperplasia of left breast 07/12/2013  . Endometrial polyp 06/25/2013  . Rectocele 06/25/2013  . Diabetic neuropathy ([place]) 05/09/2013  . Pre-ulcerative calluses 05/09/2013  . Bladder prolapse, female, acquired 05/09/2013  . Pain, joint, multiple sites 03/26/2013  . 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 Insomnia 11/29/2012  . Hip pain 09/08/2012  . Rotator cuff syndrome of left shoulder 06/21/2012  . Peripheral edema 02/04/2012  . HSV (herpes simplex virus) infection 12/09/2011  . Dyspnea 11/15/2011  . 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 Essential hypertension, benign 10/11/2011  . DM (diabetes mellitus) ([place]) 10/11/2011  . OA (osteoarthritis) 10/11/2011  . Class 2 obesity 10/11/2011  . Asthma 10/11/2011  .  Hyperlipidemia 10/11/2011  . Chronic fatigue 10/11/2011  . OSA (obstructive sleep apnea) 10/11/2011    4:17 PM, 03/12/20 Mearl Latin PT, DPT Physical Therapist at [place]  [place], [place], [address] Phone: [phone]   Fax:  [phone]  Name: Julie Clay MRN: [id number] Date of Birth: 18-Feb-1941

## 2020-03-17 ENCOUNTER — Ambulatory Visit (HOSPITAL_COMMUNITY): Payer: Medicare Other | Admitting: Physical Therapy

## 2020-03-17 ENCOUNTER — Other Ambulatory Visit: Payer: Self-pay

## 2020-03-17 ENCOUNTER — Encounter (HOSPITAL_COMMUNITY): Payer: Self-pay | Admitting: Physical Therapy

## 2020-03-17 DIAGNOSIS — M545 Low back pain, unspecified: Secondary | ICD-10-CM | POA: Diagnosis not present

## 2020-03-17 DIAGNOSIS — R29898 Other symptoms and signs involving the musculoskeletal system: Secondary | ICD-10-CM

## 2020-03-17 DIAGNOSIS — R2689 Other abnormalities of gait and mobility: Secondary | ICD-10-CM | POA: Diagnosis not present

## 2020-03-17 DIAGNOSIS — M6281 Muscle weakness (generalized): Secondary | ICD-10-CM

## 2020-03-17 NOTE — Therapy (Signed)
[place], [place], [address] Phone: [phone]   Fax:  [phone]  Physical Therapy Treatment  Patient Details  Name: Julie Clay MRN: [id number] Date of Birth: December 24, 1940 Referring Provider (PT): Chriss Czar PA-C   Encounter Date: 03/17/2020   PT End of Session - 03/17/20 1134    Visit Number 6    Number of Visits 12    Date for PT Re-Evaluation 04/01/20    Authorization Type Primary: Medicare Secondary BCBS (No auth, vl 9)    Authorization - Visit Number 6    Authorization - Number of Visits 50    Progress Note Due on Visit 10    PT Start Time 1135    PT Stop Time 1218    PT Time Calculation (min) 43 min    Activity Tolerance Patient tolerated treatment well    Behavior During Therapy [facility] for tasks assessed/performed           Past Medical History:  Diagnosis Date  . Arthritis   . Asthma   . Bone spur   . Breast cancer, left breast ([place])   . Breast cancer, right breast ([place])   . Chronic bronchitis ([place])   . COPD (chronic obstructive pulmonary disease) ([place])   . Diastolic heart failure ([place])   . Essential hypertension   . Fatty liver 2013  . GERD (gastroesophageal reflux disease)   . HSV (herpes simplex virus) infection   . Hyperlipidemia   . Lung nodule seen on imaging study 2013  . Lymphedema    Left arm  . 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 Osteoarthritis   . Type II diabetes mellitus ([place])   . Ventral hernia     Past Surgical History:  Procedure Laterality Date  . BREAST BIOPSY Left 04/2011  . BREAST BIOPSY Left 06/22/13  . BREAST BIOPSY Left 09/12/2013   Procedure: BREAST BIOPSY WITH NEEDLE LOCALIZATION;  Surgeon: Jamesetta So, MD;  Location: [facility];  Service: General;  Laterality: Left;  . BREAST LUMPECTOMY Left 04/2011 X 2  . BREAST SURGERY N/A    Phreesia 01/17/2020  . CATARACT EXTRACTION W/ INTRAOCULAR LENS  IMPLANT, BILATERAL Bilateral   . CATARACT  EXTRACTION, BILATERAL     on different occassions   . [place]  . COLON SURGERY N/A    Phreesia 01/17/2020  . DILATION AND CURETTAGE OF UTERUS  11/06/2013  . HAND LIGAMENT RECONSTRUCTION Right ~ 2010  . HYSTEROSCOPY WITH D & C N/A 11/06/2013   Procedure: DILATATION AND CURETTAGE /HYSTEROSCOPY;  Surgeon: Jonnie Kind, MD;  Location: [facility];  Service: Gynecology;  Laterality: N/A;  . INSERT / REPLACE / REMOVE PACEMAKER  06/22/2017  . LUNG LOBECTOMY Right 1988   Fungal Infection  . PACEMAKER IMPLANT N/A 06/22/2017   Procedure: PACEMAKER IMPLANT;  Surgeon: Deboraha Sprang, MD;  Location: [place] [facility];  Service: Cardiovascular;  Laterality: N/A;  . PARTIAL MASTECTOMY WITH NEEDLE LOCALIZATION Right 01/02/2020   Procedure: RIGHT PARTIAL MASTECTOMY AFTER NEEDLE LOCALIZATION;  Surgeon: Aviva Signs, MD;  Location: [facility];  Service: General;  Laterality: Right;  . POLYPECTOMY N/A 11/06/2013   Procedure: POLYPECTOMY (REMOVAL ENDOMETRIAL POLYP);  Surgeon: Jonnie Kind, MD;  Location: [facility];  Service: Gynecology;  Laterality: N/A;  . RECTOCELE REPAIR N/A 11/06/2013   Procedure: POSTERIOR REPAIR (RECTOCELE);  Surgeon: Jonnie Kind, MD;  Location: [facility];  Service:  Gynecology;  Laterality: N/A;  . SHOULDER ARTHROSCOPY WITH OPEN ROTATOR CUFF REPAIR Left 07/26/2012  . TONSILLECTOMY  1949  . TUBAL LIGATION      There were no vitals filed for this visit.   Subjective Assessment - 03/17/20 1135    Subjective Patient states she did some of the sit to stands at home. It was harder to do her sit to stands at home.    Currently in Pain? No/denies                             [facility] Adult PT Treatment/Exercise - 03/17/20 0001      Lumbar Exercises: Aerobic   Nustep UE/LE seat and UE's at 10; 10 minutes at  80-85 SPM level 1      Lumbar Exercises: Standing   Other Standing Lumbar Exercises marching 2x 10 bilateral, SLS with vectors 3x 5 second holds bilateral     Other Standing Lumbar Exercises forward step up 1x 10 6 inch step bilateral; tandem stance 2 x 30 seconds bilaterally, tandem on foam 2x 30 seconds bilateral       Lumbar Exercises: Seated   Sit to Stand 5 reps    Sit to Stand Limitations 3 sets without UE                  PT Education - 03/17/20 1139    Education Details Patient educated on HEP, exercise mechanics    Person(s) Educated Patient    Methods Explanation;Demonstration    Comprehension Verbalized understanding;Returned demonstration            PT Short Term Goals - 02/25/20 1143      PT SHORT TERM GOAL #1   Title Patient will be independent with HEP in order to improve functional outcomes.    Time 3    Period Weeks    Status On-going    Target Date 03/11/20      PT SHORT TERM GOAL #2   Title Patient will report at least 25% improvement in symptoms for improved quality of life.    Time 3    Period Weeks    Status On-going    Target Date 03/11/20             PT Long Term Goals - 02/25/20 1144      PT LONG TERM GOAL #1   Title Patient will report at least 75% improvement in symptoms for improved quality of life.    Time 6    Period Weeks    Status On-going      PT LONG TERM GOAL #2   Title Patient will improve FOTO score by at least 5 points in order to indicate improved tolerance to activity.    Time 6    Period Weeks    Status On-going      PT LONG TERM GOAL #3   Title Patient will be able to complete 5x STS in under 11.4 seconds in order to reduce the risk of falls.    Time 6    Period Weeks    Status On-going      PT LONG TERM GOAL #4   Title Patient will be able to ambulate for at least 6 minutes with pain no greater than 1/10 in order to demonstrate improved ability to ambulate in the community.    Time 6    Period Weeks    Status On-going  Plan - 03/17/20 1135    Clinical Impression Statement Patient begins session with dynamic warm up on Nu step for  focus on improving activity tolerance and endurance. Patient given cueing for SPM on nu step intermittently with good carry over but notes increase in SOB and fatigue after several minutes. Patient given cueing for reducing UE support with marching. She is able to progress to increased step height with step up exercise today but uses heavier UE support with fatigue. She is educated on pursed lip breathing due to increasing SOB with activity. She requires unilateral UE support while completing SLS with vectors secondary to impaired strength and balance. Patient will continue to benefit from skilled physical therapy in order to reduce impairment and improve function.    Personal Factors and Comorbidities Comorbidity 3+;Fitness;Age;Past/Current Experience;Time since onset of injury/illness/exacerbation    Comorbidities Allergies, Arthritis, Asthma, Back pain, BMI over 30, Cancer, Chronic ObstructivePulmonary Disease, Diabetes Type  II, Gastrointestinal Disease, High Blood Pressure, Incontinence,Pacemaker,    Examination-Activity Limitations Locomotion Level;Transfers;Stand;Stairs;Squat;Lift;Bend    Examination-Participation Restrictions Cleaning;Meal Prep;Church;Community Activity;Shop;Volunteer;Dorita Sciara    Stability/Clinical Decision Making Evolving/Moderate complexity    Rehab Potential Fair    PT Frequency 2x / week    PT Duration 6 weeks    PT Treatment/Interventions ADLs/Self Care Home Management;Aquatic Therapy;Canalith Repostioning;Cryotherapy;Electrical Stimulation;Iontophoresis 4mg /ml Dexamethasone;Traction;Moist Heat;Ultrasound;DME Instruction;Gait training;Stair training;Functional mobility training;Therapeutic exercise;Balance training;Therapeutic activities;Neuromuscular re-education;Patient/family education;Orthotic Fit/Training;Manual techniques;Manual lymph drainage;Compression bandaging;Passive range of motion;Dry needling;Energy conservation;Splinting;Taping;Spinal  Manipulations;Joint Manipulations;Vasopneumatic Device    PT Next Visit Plan progress LE strengthing and progress as tolerated.  Progress to standing exercises as able    PT Home Exercise Plan 11/2 STS    Consulted and Agree with Plan of Care Patient           Patient will benefit from skilled therapeutic intervention in order to improve the following deficits and impairments:  Abnormal gait, Difficulty walking, Decreased range of motion, Decreased endurance, Pain, Decreased activity tolerance, Decreased balance, Improper body mechanics, Impaired flexibility, Postural dysfunction, Decreased strength, Decreased mobility  Visit Diagnosis: Muscle weakness (generalized)  Other abnormalities of gait and mobility  Other symptoms and signs involving the musculoskeletal system  Low back pain, unspecified back pain laterality, unspecified chronicity, unspecified whether sciatica present     Problem List Patient Active Problem List   Diagnosis Date Noted  . 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 Chronic constipation 12/04/2019  . Pacemaker 07/13/2017  . 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/2017  . 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 Heart murmur 05/31/2016  . Primary osteoarthritis of first carpometacarpal joint of left hand 06/12/2015  . Fatty liver disease, nonalcoholic 10/93/2355  . Lymphedema 02/18/2015  . Leg cramps 08/02/2014  . Ganglion cyst of flexor tendon sheath of finger of left hand 08/02/2014  . Skin irritation 01/19/2014  . Post-operative state 11/14/2013  . Atypical ductal hyperplasia of left breast 07/12/2013  . Endometrial polyp 06/25/2013  . Rectocele 06/25/2013  . Diabetic neuropathy ([place]) 05/09/2013  . Pre-ulcerative calluses 05/09/2013  . Bladder prolapse, female, acquired 05/09/2013  . Pain, joint, multiple sites 03/26/2013  . 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 Insomnia 11/29/2012  . Hip pain 09/08/2012   . Rotator cuff syndrome of left shoulder 06/21/2012  . Peripheral edema 02/04/2012  . HSV (herpes simplex virus) infection 12/09/2011  . Dyspnea 11/15/2011  . 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 Essential hypertension, benign 10/11/2011  .  DM (diabetes mellitus) ([place]) 10/11/2011  . OA (osteoarthritis) 10/11/2011  . Class 2 obesity 10/11/2011  . Asthma 10/11/2011  . Hyperlipidemia 10/11/2011  . Chronic fatigue 10/11/2011  . OSA (obstructive sleep apnea) 10/11/2011   12:21 PM, 03/17/20 Mearl Latin PT, DPT Physical Therapist at [place], [place], [address] Phone: [phone]   Fax:  [phone]  Name: ELLIONA DODDRIDGE MRN: [id number] Date of Birth: Apr 21, 1940

## 2020-03-19 ENCOUNTER — Other Ambulatory Visit: Payer: Self-pay

## 2020-03-19 ENCOUNTER — Ambulatory Visit (HOSPITAL_COMMUNITY): Payer: Medicare Other | Attending: Physician Assistant | Admitting: Physical Therapy

## 2020-03-19 DIAGNOSIS — M545 Low back pain, unspecified: Secondary | ICD-10-CM | POA: Insufficient documentation

## 2020-03-19 DIAGNOSIS — R2689 Other abnormalities of gait and mobility: Secondary | ICD-10-CM | POA: Diagnosis not present

## 2020-03-19 DIAGNOSIS — M6281 Muscle weakness (generalized): Secondary | ICD-10-CM | POA: Diagnosis not present

## 2020-03-19 DIAGNOSIS — R29898 Other symptoms and signs involving the musculoskeletal system: Secondary | ICD-10-CM | POA: Diagnosis not present

## 2020-03-19 NOTE — Therapy (Addendum)
[place], [place], [address] Phone: [phone]   Fax:  [phone]  Physical Therapy Treatment  Patient Details  Name: Julie Clay MRN: [id number] Date of Birth: Jan 28, 1941 Referring Provider (PT): Chriss Czar PA-C   Encounter Date: 03/19/2020   PT End of Session - 03/19/20 1527    Visit Number 7    Number of Visits 12    Date for PT Re-Evaluation 04/01/20    Authorization Type Primary: Medicare Secondary BCBS (No auth, vl 31)    Authorization - Visit Number 7    Authorization - Number of Visits 50    Progress Note Due on Visit 10    PT Start Time 1452    PT Stop Time 1540    PT Time Calculation (min) 48 min    Activity Tolerance Patient tolerated treatment well    Behavior During Therapy [facility] for tasks assessed/performed           Past Medical History:  Diagnosis Date   Arthritis    Asthma    Bone spur    Breast cancer, left breast ([place])    Breast cancer, right breast ([place])    Chronic bronchitis ([place])    COPD (chronic obstructive pulmonary disease) ([place])    Diastolic heart failure ([place])    Essential hypertension    Fatty liver 2013   GERD (gastroesophageal reflux disease)    HSV (herpes simplex virus) infection    Hyperlipidemia    Lung nodule seen on imaging study 2013   Lymphedema    Left arm   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    Osteoarthritis    Type II diabetes mellitus ([place])    Ventral hernia     Past Surgical History:  Procedure Laterality Date   BREAST BIOPSY Left 04/2011   BREAST BIOPSY Left 06/22/13   BREAST BIOPSY Left 09/12/2013   Procedure: BREAST BIOPSY WITH NEEDLE LOCALIZATION;  Surgeon: Jamesetta So, MD;  Location: [facility];  Service: General;  Laterality: Left;   BREAST LUMPECTOMY Left 04/2011 X 2   BREAST SURGERY N/A    Phreesia 01/17/2020   CATARACT EXTRACTION W/ INTRAOCULAR LENS  IMPLANT, BILATERAL Bilateral    CATARACT  EXTRACTION, BILATERAL     on different occassions    CESAREAN SECTION  1973   COLON SURGERY N/A    Phreesia 01/17/2020   DILATION AND CURETTAGE OF UTERUS  11/06/2013   HAND LIGAMENT RECONSTRUCTION Right ~ 2010   HYSTEROSCOPY WITH D & C N/A 11/06/2013   Procedure: DILATATION AND CURETTAGE /HYSTEROSCOPY;  Surgeon: Jonnie Kind, MD;  Location: [facility];  Service: Gynecology;  Laterality: N/A;   INSERT / REPLACE / REMOVE PACEMAKER  06/22/2017   LUNG LOBECTOMY Right 1988   Fungal Infection   PACEMAKER IMPLANT N/A 06/22/2017   Procedure: PACEMAKER IMPLANT;  Surgeon: Deboraha Sprang, MD;  Location: [facility];  Service: Cardiovascular;  Laterality: N/A;   PARTIAL MASTECTOMY WITH NEEDLE LOCALIZATION Right 01/02/2020   Procedure: RIGHT PARTIAL MASTECTOMY AFTER NEEDLE LOCALIZATION;  Surgeon: Aviva Signs, MD;  Location: [facility];  Service: General;  Laterality: Right;   POLYPECTOMY N/A 11/06/2013   Procedure: POLYPECTOMY (REMOVAL ENDOMETRIAL POLYP);  Surgeon: Jonnie Kind, MD;  Location: [facility];  Service: Gynecology;  Laterality: N/A;   RECTOCELE REPAIR N/A 11/06/2013   Procedure: POSTERIOR REPAIR (RECTOCELE);  Surgeon: Jonnie Kind, MD;  Location: [facility];  Service:  Gynecology;  Laterality: N/A;   SHOULDER ARTHROSCOPY WITH OPEN ROTATOR CUFF REPAIR Left 07/26/2012   TONSILLECTOMY  1949   TUBAL LIGATION      There were no vitals filed for this visit.   Subjective Assessment - 03/19/20 1500    Subjective pt states she has more stiffness than pain in the mornings that has not changed much since coming to therapy.    Currently in Pain? No/denies                             [facility] Adult PT Treatment/Exercise - 03/19/20 0001      Lumbar Exercises: Aerobic   Nustep UE/LE seat and UE's at 10; 10 minutes at  80-85 SPM level 1(at EOS not included in billing)   completed 0.47 miles and avg 84 spm     Lumbar Exercises: Standing   Heel Raises 20 reps    Other  Standing Lumbar Exercises marching 20X, SLS with vectors 3x 5 second holds bilateral    Other Standing Lumbar Exercises forward step up 2x 10 6 inch step bilateral; tandem stance on foam with 2# UE flexion 10X and rotations 5X each side                    PT Short Term Goals - 02/25/20 1143      PT SHORT TERM GOAL #1   Title Patient will be independent with HEP in order to improve functional outcomes.    Time 3    Period Weeks    Status On-going    Target Date 03/11/20      PT SHORT TERM GOAL #2   Title Patient will report at least 25% improvement in symptoms for improved quality of life.    Time 3    Period Weeks    Status On-going    Target Date 03/11/20             PT Long Term Goals - 02/25/20 1144      PT LONG TERM GOAL #1   Title Patient will report at least 75% improvement in symptoms for improved quality of life.    Time 6    Period Weeks    Status On-going      PT LONG TERM GOAL #2   Title Patient will improve FOTO score by at least 5 points in order to indicate improved tolerance to activity.    Time 6    Period Weeks    Status On-going      PT LONG TERM GOAL #3   Title Patient will be able to complete 5x STS in under 11.4 seconds in order to reduce the risk of falls.    Time 6    Period Weeks    Status On-going      PT LONG TERM GOAL #4   Title Patient will be able to ambulate for at least 6 minutes with pain no greater than 1/10 in order to demonstrate improved ability to ambulate in the community.    Time 6    Period Weeks    Status On-going                 Plan - 03/19/20 1527    Clinical Impression Statement Continued focus on improving LE strength and stability.  Pt requires frequent rest breaks during session due to fatigue.  Increased challenge of tandem stance to completing UE task and rotational movement.  Pt with reduced ROM completing both due to instability.  Vectors added with 2-3 sec holds.  Rt weight stance was more  difficult than Lt.  Unable to complete without UE assistance as well.  Completed nustep at end of session today to help improve activity tolerance and endurance.    Personal Factors and Comorbidities Comorbidity 3+;Fitness;Age;Past/Current Experience;Time since onset of injury/illness/exacerbation    Comorbidities Allergies, Arthritis, Asthma, Back pain, BMI over 30, Cancer, Chronic ObstructivePulmonary Disease, Diabetes Type  II, Gastrointestinal Disease, High Blood Pressure, Incontinence,Pacemaker,    Examination-Activity Limitations Locomotion Level;Transfers;Stand;Stairs;Squat;Lift;Bend    Examination-Participation Restrictions Cleaning;Meal Prep;Church;Community Activity;Shop;Volunteer;Dorita Sciara    Stability/Clinical Decision Making Evolving/Moderate complexity    Rehab Potential Fair    PT Frequency 2x / week    PT Duration 6 weeks    PT Treatment/Interventions ADLs/Self Care Home Management;Aquatic Therapy;Canalith Repostioning;Cryotherapy;Electrical Stimulation;Iontophoresis 4mg /ml Dexamethasone;Traction;Moist Heat;Ultrasound;DME Instruction;Gait training;Stair training;Functional mobility training;Therapeutic exercise;Balance training;Therapeutic activities;Neuromuscular re-education;Patient/family education;Orthotic Fit/Training;Manual techniques;Manual lymph drainage;Compression bandaging;Passive range of motion;Dry needling;Energy conservation;Splinting;Taping;Spinal Manipulations;Joint Manipulations;Vasopneumatic Device    PT Next Visit Plan progress LE functional strengthing and exercise tolerance.    PT Home Exercise Plan 11/2 STS    Consulted and Agree with Plan of Care Patient           Patient will benefit from skilled therapeutic intervention in order to improve the following deficits and impairments:  Abnormal gait, Difficulty walking, Decreased range of motion, Decreased endurance, Pain, Decreased activity tolerance, Decreased balance, Improper body mechanics, Impaired  flexibility, Postural dysfunction, Decreased strength, Decreased mobility  Visit Diagnosis: Muscle weakness (generalized)  Other abnormalities of gait and mobility  Other symptoms and signs involving the musculoskeletal system  Low back pain, unspecified back pain laterality, unspecified chronicity, unspecified whether sciatica present     Problem List Patient Active Problem List   Diagnosis Date Noted   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 ([place])    Chronic constipation 12/04/2019   Pacemaker 07/13/2017   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/2017   2nd degree AV block 06/22/2017   Heart murmur 05/31/2016   Primary osteoarthritis of first carpometacarpal joint of left hand 06/12/2015   Fatty liver disease, nonalcoholic 16/01/9603   Lymphedema 02/18/2015   Leg cramps 08/02/2014   Ganglion cyst of flexor tendon sheath of finger of left hand 08/02/2014   Skin irritation 01/19/2014   Post-operative state 11/14/2013   Atypical ductal hyperplasia of left breast 07/12/2013   Endometrial polyp 06/25/2013   Rectocele 06/25/2013   Diabetic neuropathy ([place]) 05/09/2013   Pre-ulcerative calluses 05/09/2013   Bladder prolapse, female, acquired 05/09/2013   Pain, joint, multiple sites 03/26/2013   CKD (chronic kidney disease), stage II 03/26/2013   Insomnia 11/29/2012   Hip pain 09/08/2012   Rotator cuff syndrome of left shoulder 06/21/2012   Peripheral edema 02/04/2012   HSV (herpes simplex virus) infection 12/09/2011   Dyspnea 11/15/2011   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 [place], [place], [address] Phone: [phone]   Fax:  [phone]  Name: KESHANA KLEMZ MRN: [id number] Date of Birth: 1941-04-02

## 2020-03-21 DIAGNOSIS — M25561 Pain in right knee: Secondary | ICD-10-CM | POA: Diagnosis not present

## 2020-03-24 ENCOUNTER — Other Ambulatory Visit: Payer: Self-pay

## 2020-03-24 ENCOUNTER — Other Ambulatory Visit: Payer: Self-pay | Admitting: Family Medicine

## 2020-03-24 ENCOUNTER — Encounter (HOSPITAL_COMMUNITY): Payer: Self-pay | Admitting: Physical Therapy

## 2020-03-24 ENCOUNTER — Ambulatory Visit (HOSPITAL_COMMUNITY): Payer: Medicare Other | Admitting: Physical Therapy

## 2020-03-24 DIAGNOSIS — M6281 Muscle weakness (generalized): Secondary | ICD-10-CM | POA: Diagnosis not present

## 2020-03-24 DIAGNOSIS — R2689 Other abnormalities of gait and mobility: Secondary | ICD-10-CM | POA: Diagnosis not present

## 2020-03-24 DIAGNOSIS — R29898 Other symptoms and signs involving the musculoskeletal system: Secondary | ICD-10-CM | POA: Diagnosis not present

## 2020-03-24 DIAGNOSIS — M545 Low back pain, unspecified: Secondary | ICD-10-CM

## 2020-03-24 NOTE — Therapy (Signed)
Julie Clay, [place], [address] Phone: [phone]   Fax:  [phone]  Physical Therapy Treatment  Patient Details  Name: Julie Clay MRN: [id number] Date of Birth: 28-Aug-1940 Referring Provider (PT): Chriss Czar PA-C   Encounter Date: 03/24/2020   PT End of Session - 03/24/20 1131    Visit Number 8    Number of Visits 12    Date for PT Re-Evaluation 04/01/20    Authorization Type Primary: Medicare Secondary BCBS (No auth, vl 61)    Authorization - Visit Number 8    Authorization - Number of Visits 50    Progress Note Due on Visit 10    PT Start Time 1132    PT Stop Time 1215    PT Time Calculation (min) 43 min    Activity Tolerance Patient tolerated treatment well    Behavior During Therapy WFL for tasks assessed/performed           Past Medical History:  Diagnosis Date   Arthritis    Asthma    Bone spur    Breast cancer, left breast ([place])    Breast cancer, right breast ([place])    Chronic bronchitis ([place])    COPD (chronic obstructive pulmonary disease) ([place])    Diastolic heart failure ([place])    Essential hypertension    Fatty liver 2013   GERD (gastroesophageal reflux disease)    HSV (herpes simplex virus) infection    Hyperlipidemia    Lung nodule seen on imaging study 2013   Lymphedema    Left arm   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    Osteoarthritis    Type II diabetes mellitus ([place])    Ventral hernia     Past Surgical History:  Procedure Laterality Date   BREAST BIOPSY Left 04/2011   BREAST BIOPSY Left 06/22/13   BREAST BIOPSY Left 09/12/2013   Procedure: BREAST BIOPSY WITH NEEDLE LOCALIZATION;  Surgeon: Jamesetta So, MD;  Location: [facility];  Service: General;  Laterality: Left;   BREAST LUMPECTOMY Left 04/2011 X 2   BREAST SURGERY N/A    Phreesia 01/17/2020   CATARACT EXTRACTION W/ INTRAOCULAR LENS  IMPLANT, BILATERAL Bilateral    CATARACT  EXTRACTION, BILATERAL     on different occassions    CESAREAN SECTION  1973   COLON SURGERY N/A    Phreesia 01/17/2020   DILATION AND CURETTAGE OF UTERUS  11/06/2013   HAND LIGAMENT RECONSTRUCTION Right ~ 2010   HYSTEROSCOPY WITH D & C N/A 11/06/2013   Procedure: DILATATION AND CURETTAGE /HYSTEROSCOPY;  Surgeon: Jonnie Kind, MD;  Location: [facility];  Service: Gynecology;  Laterality: N/A;   INSERT / REPLACE / REMOVE PACEMAKER  06/22/2017   LUNG LOBECTOMY Right 1988   Fungal Infection   PACEMAKER IMPLANT N/A 06/22/2017   Procedure: PACEMAKER IMPLANT;  Surgeon: Deboraha Sprang, MD;  Location: [facility];  Service: Cardiovascular;  Laterality: N/A;   PARTIAL MASTECTOMY WITH NEEDLE LOCALIZATION Right 01/02/2020   Procedure: RIGHT PARTIAL MASTECTOMY AFTER NEEDLE LOCALIZATION;  Surgeon: Aviva Signs, MD;  Location: [facility];  Service: General;  Laterality: Right;   POLYPECTOMY N/A 11/06/2013   Procedure: POLYPECTOMY (REMOVAL ENDOMETRIAL POLYP);  Surgeon: Jonnie Kind, MD;  Location: [facility];  Service: Gynecology;  Laterality: N/A;   RECTOCELE REPAIR N/A 11/06/2013   Procedure: POSTERIOR REPAIR (RECTOCELE);  Surgeon: Jonnie Kind, MD;  Location: [facility];  Service:  Gynecology;  Laterality: N/A;   SHOULDER ARTHROSCOPY WITH OPEN ROTATOR CUFF REPAIR Left 07/26/2012   TONSILLECTOMY  1949   TUBAL LIGATION      There were no vitals filed for this visit.   Subjective Assessment - 03/24/20 1132    Subjective Patient states she had to get a shot in her knee because it was so sore. Her sugar went up high at first but has been going back down.    Currently in Pain? No/denies                             [facility] Adult PT Treatment/Exercise - 03/24/20 0001      Lumbar Exercises: Aerobic   Nustep UE/LE seat and UE's at 10; 10 minutes at  80-85 SPM level 1 for dynamic warm up and improving activity tolerance      Lumbar Exercises: Standing   Row Both;15 reps     Theraband Level (Row) Level 2 (Red)    Shoulder Extension Both;15 reps    Theraband Level (Shoulder Extension) Level 2 (Red)    Other Standing Lumbar Exercises cone taps with foot with unilateral UE support with 3 cones 2 sets of 5     Other Standing Lumbar Exercises lateral stepping 4x 10 feet 2 sets      Lumbar Exercises: Seated   Sit to Stand 5 reps    Sit to Stand Limitations 3 sets without UE    Other Seated Lumbar Exercises hip abduction isometric with belt 10x 10 second holds                  PT Education - 03/24/20 1131    Education Details Patient educated on HEP, exercise mechanics    Person(s) Educated Patient    Methods Explanation;Demonstration    Comprehension Verbalized understanding;Returned demonstration            PT Short Term Goals - 02/25/20 1143      PT SHORT TERM GOAL #1   Title Patient will be independent with HEP in order to improve functional outcomes.    Time 3    Period Weeks    Status On-going    Target Date 03/11/20      PT SHORT TERM GOAL #2   Title Patient will report at least 25% improvement in symptoms for improved quality of life.    Time 3    Period Weeks    Status On-going    Target Date 03/11/20             PT Long Term Goals - 02/25/20 1144      PT LONG TERM GOAL #1   Title Patient will report at least 75% improvement in symptoms for improved quality of life.    Time 6    Period Weeks    Status On-going      PT LONG TERM GOAL #2   Title Patient will improve FOTO score by at least 5 points in order to indicate improved tolerance to activity.    Time 6    Period Weeks    Status On-going      PT LONG TERM GOAL #3   Title Patient will be able to complete 5x STS in under 11.4 seconds in order to reduce the risk of falls.    Time 6    Period Weeks    Status On-going      PT LONG TERM GOAL #4  Title Patient will be able to ambulate for at least 6 minutes with pain no greater than 1/10 in order to demonstrate  improved ability to ambulate in the community.    Time 6    Period Weeks    Status On-going                 Plan - 03/24/20 1131    Clinical Impression Statement Patient showing improving activity tolerance today on nustep with decreased SOB compared to prior sessions. She is also able to maintain higher step per minute average today. Patient tolerates standing postural strengthening exercises today with minimal SOB during but is fatigued following. Patient given cueing to reduce UT activation. She requires unilateral UE support with cone taps secondary to impaired balance. She continues to require frequent seated rest breaks following standing exercises. Patient will continue to benefit from skilled physical therapy in order to reduce impairment and improve function.    Personal Factors and Comorbidities Comorbidity 3+;Fitness;Age;Past/Current Experience;Time since onset of injury/illness/exacerbation    Comorbidities Allergies, Arthritis, Asthma, Back pain, BMI over 30, Cancer, Chronic ObstructivePulmonary Disease, Diabetes Type  II, Gastrointestinal Disease, High Blood Pressure, Incontinence,Pacemaker,    Examination-Activity Limitations Locomotion Level;Transfers;Stand;Stairs;Squat;Lift;Bend    Examination-Participation Restrictions Cleaning;Meal Prep;Church;Community Activity;Shop;Volunteer;Dorita Sciara    Stability/Clinical Decision Making Evolving/Moderate complexity    Rehab Potential Fair    PT Frequency 2x / week    PT Duration 6 weeks    PT Treatment/Interventions ADLs/Self Care Home Management;Aquatic Therapy;Canalith Repostioning;Cryotherapy;Electrical Stimulation;Iontophoresis 4mg /ml Dexamethasone;Traction;Moist Heat;Ultrasound;DME Instruction;Gait training;Stair training;Functional mobility training;Therapeutic exercise;Balance training;Therapeutic activities;Neuromuscular re-education;Patient/family education;Orthotic Fit/Training;Manual techniques;Manual lymph  drainage;Compression bandaging;Passive range of motion;Dry needling;Energy conservation;Splinting;Taping;Spinal Manipulations;Joint Manipulations;Vasopneumatic Device    PT Next Visit Plan progress LE functional strengthing and exercise tolerance.    PT Home Exercise Plan 11/2 STS    Consulted and Agree with Plan of Care Patient           Patient will benefit from skilled therapeutic intervention in order to improve the following deficits and impairments:  Abnormal gait, Difficulty walking, Decreased range of motion, Decreased endurance, Pain, Decreased activity tolerance, Decreased balance, Improper body mechanics, Impaired flexibility, Postural dysfunction, Decreased strength, Decreased mobility  Visit Diagnosis: Muscle weakness (generalized)  Other abnormalities of gait and mobility  Other symptoms and signs involving the musculoskeletal system  Low back pain, unspecified back pain laterality, unspecified chronicity, unspecified whether sciatica present     Problem List Patient Active Problem List   Diagnosis Date Noted   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 ([place])    Chronic constipation 12/04/2019   Pacemaker 07/13/2017   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/2017   2nd degree AV block 06/22/2017   Heart murmur 05/31/2016   Primary osteoarthritis of first carpometacarpal joint of left hand 06/12/2015   Fatty liver disease, nonalcoholic 09/32/6712   Lymphedema 02/18/2015   Leg cramps 08/02/2014   Ganglion cyst of flexor tendon sheath of finger of left hand 08/02/2014   Skin irritation 01/19/2014   Post-operative state 11/14/2013   Atypical ductal hyperplasia of left breast 07/12/2013   Endometrial polyp 06/25/2013   Rectocele 06/25/2013   Diabetic neuropathy ([place]) 05/09/2013   Pre-ulcerative calluses 05/09/2013   Bladder prolapse, female, acquired 05/09/2013   Pain, joint, multiple sites 03/26/2013   CKD (chronic  kidney disease), stage II 03/26/2013   Insomnia 11/29/2012   Hip pain 09/08/2012   Rotator cuff syndrome of left shoulder 06/21/2012   Peripheral edema 02/04/2012   HSV (herpes simplex  virus) infection 12/09/2011   Dyspnea 11/15/2011   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

## 2020-03-25 ENCOUNTER — Ambulatory Visit (INDEPENDENT_AMBULATORY_CARE_PROVIDER_SITE_OTHER): Payer: Medicare Other

## 2020-03-25 ENCOUNTER — Other Ambulatory Visit: Payer: Self-pay | Admitting: Family Medicine

## 2020-03-25 DIAGNOSIS — I441 Atrioventricular block, second degree: Secondary | ICD-10-CM | POA: Diagnosis not present

## 2020-03-25 LAB — CUP PACEART REMOTE DEVICE CHECK
Battery Remaining Longevity: 101 mo
Battery Remaining Percentage: 95.5 %
Battery Voltage: 2.99 V
Brady Statistic AP VP Percent: 4.4 %
Brady Statistic AP VS Percent: 1 %
Brady Statistic AS VP Percent: 96 %
Brady Statistic AS VS Percent: 1 %
Brady Statistic RA Percent Paced: 4.3 %
Brady Statistic RV Percent Paced: 99 %
Date Time Interrogation Session: 20211207020015
Implantable Lead Implant Date: 20190306
Implantable Lead Implant Date: 20190306
Implantable Lead Location: 753859
Implantable Lead Location: 753860
Implantable Lead Model: 5076
Implantable Lead Model: 5076
Implantable Pulse Generator Implant Date: 20190306
Lead Channel Impedance Value: 340 Ohm
Lead Channel Impedance Value: 440 Ohm
Lead Channel Pacing Threshold Amplitude: 0.5 V
Lead Channel Pacing Threshold Amplitude: 0.75 V
Lead Channel Pacing Threshold Pulse Width: 0.5 ms
Lead Channel Pacing Threshold Pulse Width: 0.5 ms
Lead Channel Sensing Intrinsic Amplitude: 4.1 mV
Lead Channel Sensing Intrinsic Amplitude: 5.6 mV
Lead Channel Setting Pacing Amplitude: 2 V
Lead Channel Setting Pacing Amplitude: 2.5 V
Lead Channel Setting Pacing Pulse Width: 0.5 ms
Lead Channel Setting Sensing Sensitivity: 2 mV
Pulse Gen Model: 2272
Pulse Gen Serial Number: 9001027

## 2020-03-26 ENCOUNTER — Ambulatory Visit (HOSPITAL_COMMUNITY): Payer: Medicare Other

## 2020-03-31 ENCOUNTER — Ambulatory Visit (HOSPITAL_COMMUNITY): Payer: Medicare Other | Admitting: Physical Therapy

## 2020-03-31 ENCOUNTER — Other Ambulatory Visit: Payer: Self-pay

## 2020-03-31 ENCOUNTER — Encounter (HOSPITAL_COMMUNITY): Payer: Self-pay | Admitting: Physical Therapy

## 2020-03-31 DIAGNOSIS — M6281 Muscle weakness (generalized): Secondary | ICD-10-CM

## 2020-03-31 DIAGNOSIS — R2689 Other abnormalities of gait and mobility: Secondary | ICD-10-CM

## 2020-03-31 DIAGNOSIS — M545 Low back pain, unspecified: Secondary | ICD-10-CM | POA: Diagnosis not present

## 2020-03-31 DIAGNOSIS — R29898 Other symptoms and signs involving the musculoskeletal system: Secondary | ICD-10-CM | POA: Diagnosis not present

## 2020-03-31 NOTE — Patient Instructions (Signed)
Access Code: [email or web address] URL: [email or web address] Date: 03/31/2020 Prepared by: [facility] Demari Kropp  Exercises Standing Shoulder Row with Anchored Resistance - 1 x daily - 7 x weekly - 1 sets - 15 reps Shoulder extension with resistance - Neutral - 1 x daily - 7 x weekly - 1 sets - 15 reps

## 2020-03-31 NOTE — Therapy (Signed)
[facility] [address] [place], [place], [address] Phone: [phone]   Fax:  [phone]  Physical Therapy Treatment/Progress Note/Recert  Patient Details  Name: Julie Clay MRN: [id number] Date of Birth: 10-09-1940 Referring Provider (PT): Margart Sickles PA-C   Encounter Date: 03/31/2020   Progress Note   Reporting Period 02/19/20 to 03/31/20   See note below for Objective Data and Assessment of Progress/Goals    PT End of Session - 03/31/20 1131    Visit Number 9    Number of Visits 20    Date for PT Re-Evaluation 04/28/20    Authorization Type Primary: Medicare Secondary BCBS (No auth, vl 50)    Authorization - Visit Number 9    Authorization - Number of Visits 50    Progress Note Due on Visit 19    PT Start Time 1133    PT Stop Time 1213    PT Time Calculation (min) 40 min    Activity Tolerance Patient tolerated treatment well    Behavior During Therapy [facility] for tasks assessed/performed           Past Medical History:  Diagnosis Date  . Arthritis   . Asthma   . Bone spur   . Breast cancer, left breast ([facility])   . Breast cancer, right breast ([facility])   . Chronic bronchitis (H[facility])   . COPD (chronic obstructive pulmonary disease) ([facility])   . Diastolic heart failure ([facility])   . Essential hypertension   . Fatty liver 2013  . GERD (gastroesophageal reflux disease)   . HSV (herpes simplex virus) infection   . Hyperlipidemia   . Lung nodule seen on imaging study 2013  . Lymphedema    Left arm  . 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 Osteoarthritis   . Type II diabetes mellitus ([facility])   . Ventral hernia     Past Surgical History:  Procedure Laterality Date  . BREAST BIOPSY Left 04/2011  . BREAST BIOPSY Left 06/22/13  . BREAST BIOPSY Left 09/12/2013   Procedure: BREAST BIOPSY WITH NEEDLE LOCALIZATION;  Surgeon: Dalia Heading, MD;  Location: [facility];  Service: General;  Laterality: Left;  . BREAST LUMPECTOMY  Left 04/2011 X 2  . BREAST SURGERY N/A    Phreesia 01/17/2020  . CATARACT EXTRACTION W/ INTRAOCULAR LENS  IMPLANT, BILATERAL Bilateral   . CATARACT EXTRACTION, BILATERAL     on different occassions   . CESAREAN SECTION  1973  . COLON SURGERY N/A    Phreesia 01/17/2020  . DILATION AND CURETTAGE OF UTERUS  11/06/2013  . HAND LIGAMENT RECONSTRUCTION Right ~ 2010  . HYSTEROSCOPY WITH D & C N/A 11/06/2013   Procedure: DILATATION AND CURETTAGE /HYSTEROSCOPY;  Surgeon: Tilda Burrow, MD;  Location: [facility];  Service: Gynecology;  Laterality: N/A;  . INSERT / REPLACE / REMOVE PACEMAKER  06/22/2017  . LUNG LOBECTOMY Right 1988   Fungal Infection  . PACEMAKER IMPLANT N/A 06/22/2017   Procedure: PACEMAKER IMPLANT;  Surgeon: Duke Salvia, MD;  Location: [facility] IN[facility];  Service: Cardiovascular;  Laterality: N/A;  . PARTIAL MASTECTOMY WITH NEEDLE LOCALIZATION Right 01/02/2020   Procedure: RIGHT PARTIAL MASTECTOMY AFTER NEEDLE LOCALIZATION;  Surgeon: Franky Macho, MD;  Location: [facility];  Service: General;  Laterality: Right;  . POLYPECTOMY N/A 11/06/2013   Procedure: POLYPECTOMY (REMOVAL ENDOMETRIAL POLYP);  Surgeon: Tilda Burrow, MD;  Location: [facility];  Service: Gynecology;  Laterality:  N/A;  . RECTOCELE REPAIR N/A 11/06/2013   Procedure: POSTERIOR REPAIR (RECTOCELE);  Surgeon: Jonnie Kind, MD;  Location: [facility];  Service: Gynecology;  Laterality: N/A;  . SHOULDER ARTHROSCOPY WITH OPEN ROTATOR CUFF REPAIR Left 07/26/2012  . TONSILLECTOMY  1949  . TUBAL LIGATION      There were no vitals filed for this visit.   Subjective Assessment - 03/31/20 1133    Subjective Patient has not been doing home exercises. She has been more active and she does not feel that she is getting as short of breath. She did not sleep well last night. She can walk about 5 minutes at a time or maybe more. Patient states 40% improvement with physical therapy intervention. She remains limited walking, endurance, standing,  and SOB.    Currently in Pain? No/denies              [facility] PT Assessment - 03/31/20 0001      Assessment   Medical Diagnosis LBP/ Posture    Referring Provider (PT) Chriss Czar PA-C    Onset Date/Surgical Date 02/18/14    Next MD Visit none scheduled    Prior Therapy yes      Precautions   Precautions None      Restrictions   Weight Bearing Restrictions No      Balance Screen   Has the patient fallen in the past 6 months No    Has the patient had a decrease in activity level because of a fear of falling?  No    Is the patient reluctant to leave their home because of a fear of falling?  No      Prior Function   Level of Independence Independent with basic ADLs    Vocation Retired    [employer] Retired - worked at [employer]   Overall Cognitive Status Within [employer] for tasks assessed      Observation/Other Assessments   Observations Ambulates without AD    Focus on Therapeutic Outcomes (FOTO)  47% limited      Sensation   Light Touch Appears Intact      Posture/Postural Control   Posture/Postural Control Postural limitations    Postural Limitations Rounded Shoulders;Forward head;Decreased lumbar lordosis      Strength   Right Hip Flexion 4/5    Left Hip Flexion 4/5    Right Knee Flexion 5/5    Right Knee Extension 5/5    Left Knee Flexion 5/5    Left Knee Extension 5/5    Right Ankle Dorsiflexion 5/5    Left Ankle Dorsiflexion 5/5      Transfers   Five time sit to stand comments  15.48 seconds without UE use, using leg strength vs. momentum      Ambulation/Gait   Ambulation/Gait Yes    Ambulation/Gait Assistance 7: Independent    Ambulation Distance (Feet) 363 Feet    Assistive device None    Gait Pattern Trunk flexed    Ambulation Surface Level;Indoor    Gait velocity decreased    Gait Comments 2 MWT, slow, labored , increasing SOB                         [facility] Adult PT Treatment/Exercise -  03/31/20 0001      Lumbar Exercises: Standing   Row Both;15 reps    Theraband Level (Row) Level 3 (Green)    Shoulder Extension Both;15 reps  Theraband Level (Shoulder Extension) Level 3 (Green)    Other Standing Lumbar Exercises step up 2x10 bilateral 6 inch step      Lumbar Exercises: Seated   Sit to Stand 10 reps    Sit to Stand Limitations 2 sets without UE                  PT Education - 03/31/20 1131    Education Details Patient educated on HEP, exercise mechanics, reassessment findings, POC    Person(s) Educated Patient    Methods Explanation;Demonstration    Comprehension Verbalized understanding;Returned demonstration            PT Short Term Goals - 03/31/20 1139      PT SHORT TERM GOAL #1   Title Patient will be independent with HEP in order to improve functional outcomes.    Time 3    Period Weeks    Status On-going    Target Date 03/11/20      PT SHORT TERM GOAL #2   Title Patient will report at least 25% improvement in symptoms for improved quality of life.    Time 3    Period Weeks    Status Achieved    Target Date 03/11/20             PT Long Term Goals - 03/31/20 1139      PT LONG TERM GOAL #1   Title Patient will report at least 75% improvement in symptoms for improved quality of life.    Time 6    Period Weeks    Status On-going      PT LONG TERM GOAL #2   Title Patient will improve FOTO score by at least 5 points in order to indicate improved tolerance to activity.    Time 6    Period Weeks    Status On-going      PT LONG TERM GOAL #3   Title Patient will be able to complete 5x STS in under 11.4 seconds in order to reduce the risk of falls.    Time 6    Period Weeks    Status On-going      PT LONG TERM GOAL #4   Title Patient will be able to ambulate for at least 6 minutes with pain no greater than 1/10 in order to demonstrate improved ability to ambulate in the community.    Time 6    Period Weeks    Status Achieved                  Plan - 03/31/20 1132    Clinical Impression Statement Patient has met 1/2 short term goals and 1/4 long term goals with report of improvement in symptoms and improved ambulation ability. Patient continues to be limited by impaired activity tolerance, functional mobility, and endurance. Patient showing improving LE strength, gait, endurance and activity tolerance since initial evaluation. Extending POC as patient has made functional improvements and will continue to benefit from skilled physical therapy. Patient will be working to transition to self-management with joining [facility] upon discharge. Patient will continue to benefit from skilled physical therapy in order to reduce impairment and improve function.    Personal Factors and Comorbidities Comorbidity 3+;Fitness;Age;Past/Current Experience;Time since onset of injury/illness/exacerbation    Comorbidities Allergies, Arthritis, Asthma, Back pain, BMI over 30, Cancer, Chronic ObstructivePulmonary Disease, Diabetes Type  II, Gastrointestinal Disease, High Blood Pressure, Incontinence,Pacemaker,    Examination-Activity Limitations Locomotion Level;Transfers;Stand;Stairs;Squat;Lift;Bend    Examination-Participation Restrictions Cleaning;Meal Prep;Church;Community  Activity;Shop;Volunteer;Valla Leaver [facility]    Stability/Clinical Decision Making Evolving/Moderate complexity    Rehab Potential Fair    PT Frequency 2x / week    PT Duration 4 weeks    PT Treatment/Interventions ADLs/Self Care Home Management;Aquatic Therapy;Canalith Repostioning;Cryotherapy;Electrical Stimulation;Iontophoresis 65m/ml Dexamethasone;Traction;Moist Heat;Ultrasound;DME Instruction;Gait training;Stair training;Functional mobility training;Therapeutic exercise;Balance training;Therapeutic activities;Neuromuscular re-education;Patient/family education;Orthotic Fit/Training;Manual techniques;Manual lymph drainage;Compression bandaging;Passive range of motion;Dry  needling;Energy conservation;Splinting;Taping;Spinal Manipulations;Joint Manipulations;Vasopneumatic Device    PT Next Visit Plan progress LE functional strengthing and exercise tolerance.    PT Home Exercise Plan 11/2 STS 12/13 Row, extension    Consulted and Agree with Plan of Care Patient           Patient will benefit from skilled therapeutic intervention in order to improve the following deficits and impairments:  Abnormal gait,Difficulty walking,Decreased range of motion,Decreased endurance,Pain,Decreased activity tolerance,Decreased balance,Improper body mechanics,Impaired flexibility,Postural dysfunction,Decreased strength,Decreased mobility  Visit Diagnosis: Muscle weakness (generalized)  Other abnormalities of gait and mobility  Other symptoms and signs involving the musculoskeletal system  Low back pain, unspecified back pain laterality, unspecified chronicity, unspecified whether sciatica present     Problem List Patient Active Problem List   Diagnosis Date Noted  . 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 ([place]   . Chronic constipation 12/04/2019  . Pacemaker 07/13/2017  . 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/2017  . 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 Heart murmur 05/31/2016  . Primary osteoarthritis of first carpometacarpal joint of left hand 06/12/2015  . Fatty liver disease, nonalcoholic 039/53/2023 . Lymphedema 02/18/2015  . Leg cramps 08/02/2014  . Ganglion cyst of flexor tendon sheath of finger of left hand 08/02/2014  . Skin irritation 01/19/2014  . Post-operative state 11/14/2013  . Atypical ductal hyperplasia of left breast 07/12/2013  . Endometrial polyp 06/25/2013  . Rectocele 06/25/2013  . [place]abetic neuropathy ([place] 05/09/2013  . Pre-ulcerative calluses 05/09/2013  . Bladder prolapse, female, acquired 05/09/2013  . Pain, joint, multiple sites 03/26/2013  . 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 Insomnia  11/29/2012  . Hip pain 09/08/2012  . Rotator cuff syndrome of left shoulder 06/21/2012  . Peripheral edema 02/04/2012  . HSV (herpes simplex virus) infection 12/09/2011  . Dyspnea 11/15/2011  . 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 Essential hypertension, benign 10/11/2011  . DM (diabetes mellitus) ([place] 10/11/2011  . OA (osteoarthritis) 10/11/2011  . Class 2 obesity 10/11/2011  . Asthma 10/11/2011  . Hyperlipidemia 10/11/2011  . Chronic fatigue 10/11/2011  . OSA (obstructive sleep apnea) 10/11/2011    12:21 PM, 03/31/20 AMearl LatinPT, DPT [place] [address]Phone: [phone]  Fax:  [phone] Name: Julie KOSLOWMRN: [id number] of Birth: [birth year]

## 2020-04-02 ENCOUNTER — Other Ambulatory Visit: Payer: Self-pay

## 2020-04-02 ENCOUNTER — Encounter (HOSPITAL_COMMUNITY): Payer: Self-pay | Admitting: Physical Therapy

## 2020-04-02 ENCOUNTER — Ambulatory Visit (HOSPITAL_COMMUNITY): Payer: Medicare Other | Admitting: Physical Therapy

## 2020-04-02 DIAGNOSIS — M545 Low back pain, unspecified: Secondary | ICD-10-CM

## 2020-04-02 DIAGNOSIS — R2689 Other abnormalities of gait and mobility: Secondary | ICD-10-CM

## 2020-04-02 DIAGNOSIS — R29898 Other symptoms and signs involving the musculoskeletal system: Secondary | ICD-10-CM

## 2020-04-02 DIAGNOSIS — M6281 Muscle weakness (generalized): Secondary | ICD-10-CM

## 2020-04-02 NOTE — Therapy (Signed)
[place], [place], [address] Phone: [phone]   Fax:  [phone]  Physical Therapy Treatment  Patient Details  Name: Julie Clay MRN: [id number] Date of Birth: [birth year] Referring Provider (PT): Chriss Czar PA-C   Encounter Date: 04/02/2020   PT End of Session - 04/02/20 1051    Visit Number 10    Number of Visits 20    Date for PT Re-Evaluation 04/28/20    Authorization Type Primary: Medicare Secondary BCBS (No auth, vl 1)    Authorization - Visit Number 10    Authorization - Number of Visits 50    Progress Note Due on Visit 19    PT Start Time 1052    PT Stop Time 1135    PT Time Calculation (min) 43 min    Activity Tolerance Patient tolerated treatment well    Behavior During Therapy [facility] for tasks assessed/performed           Past Medical History:  Diagnosis Date  . Arthritis   . Asthma   . Bone spur   . Breast cancer, left breast ([place])   . Breast cancer, right breast ([place])   . Chronic bronchitis ([place])   . COPD (chronic obstructive pulmonary disease) ([place])   . Diastolic heart failure ([place])   . Essential hypertension   . Fatty liver 2013  . GERD (gastroesophageal reflux disease)   . HSV (herpes simplex virus) infection   . Hyperlipidemia   . Lung nodule seen on imaging study 2013  . Lymphedema    Left arm  . 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 Osteoarthritis   . Type II diabetes mellitus ([place])   . Ventral hernia     Past Surgical History:  Procedure Laterality Date  . BREAST BIOPSY Left 04/2011  . BREAST BIOPSY Left 06/22/13  . BREAST BIOPSY Left 09/12/2013   Procedure: BREAST BIOPSY WITH NEEDLE LOCALIZATION;  Surgeon: Jamesetta So, MD;  Location: [facility];  Service: General;  Laterality: Left;  . BREAST LUMPECTOMY Left 04/2011 X 2  . BREAST SURGERY N/A    Phreesia 01/17/2020  . CATARACT EXTRACTION W/ INTRAOCULAR LENS  IMPLANT, BILATERAL Bilateral   . CATARACT  EXTRACTION, BILATERAL     on different occassions   . [place]  . COLON SURGERY N/A    Phreesia 01/17/2020  . DILATION AND CURETTAGE OF UTERUS  11/06/2013  . HAND LIGAMENT RECONSTRUCTION Right ~ 2010  . HYSTEROSCOPY WITH D & C N/A 11/06/2013   Procedure: DILATATION AND CURETTAGE /HYSTEROSCOPY;  Surgeon: Jonnie Kind, MD;  Location: [facility];  Service: Gynecology;  Laterality: N/A;  . INSERT / REPLACE / REMOVE PACEMAKER  06/22/2017  . LUNG LOBECTOMY Right 1988   Fungal Infection  . PACEMAKER IMPLANT N/A 06/22/2017   Procedure: PACEMAKER IMPLANT;  Surgeon: Deboraha Sprang, MD;  Location: [place] [facility];  Service: Cardiovascular;  Laterality: N/A;  . PARTIAL MASTECTOMY WITH NEEDLE LOCALIZATION Right 01/02/2020   Procedure: RIGHT PARTIAL MASTECTOMY AFTER NEEDLE LOCALIZATION;  Surgeon: Aviva Signs, MD;  Location: [facility];  Service: General;  Laterality: Right;  . POLYPECTOMY N/A 11/06/2013   Procedure: POLYPECTOMY (REMOVAL ENDOMETRIAL POLYP);  Surgeon: Jonnie Kind, MD;  Location: [facility];  Service: Gynecology;  Laterality: N/A;  . RECTOCELE REPAIR N/A 11/06/2013   Procedure: POSTERIOR REPAIR (RECTOCELE);  Surgeon: Jonnie Kind, MD;  Location: [facility];  Service:  Gynecology;  Laterality: N/A;  . SHOULDER ARTHROSCOPY WITH OPEN ROTATOR CUFF REPAIR Left 07/26/2012  . TONSILLECTOMY  1949  . TUBAL LIGATION      There were no vitals filed for this visit.   Subjective Assessment - 04/02/20 1053    Subjective Patient states she is tired this morning. She was not able to perform the band exercises yet.    Currently in Pain? No/denies                             [facility] Adult PT Treatment/Exercise - 04/02/20 0001      Lumbar Exercises: Aerobic   Nustep UE/LE seat and UE's at 10; 10 minutes at  80-90 SPM level 1 for dynamic warm up and improving activity tolerance      Lumbar Exercises: Standing   Other Standing Lumbar Exercises hip abduction with green band at  feet 2x10 bilateral    Other Standing Lumbar Exercises lateral stepping 4x10 feet bilateral      Lumbar Exercises: Seated   Sit to Stand 10 reps    Sit to Stand Limitations 2 sets without UE      Lumbar Exercises: Supine   Bridge 10 reps    Straight Leg Raise 10 reps    Other Supine Lumbar Exercises hip extension isometric 10x 5 second holds bilateral      Lumbar Exercises: Sidelying   Clam Both;15 reps                  PT Education - 04/02/20 1053    Education Details Patient educated on HEP, exercise mechanics    Person(s) Educated Patient    Methods Explanation;Demonstration    Comprehension Verbalized understanding;Returned demonstration            PT Short Term Goals - 03/31/20 1139      PT SHORT TERM GOAL #1   Title Patient will be independent with HEP in order to improve functional outcomes.    Time 3    Period Weeks    Status On-going    Target Date 03/11/20      PT SHORT TERM GOAL #2   Title Patient will report at least 25% improvement in symptoms for improved quality of life.    Time 3    Period Weeks    Status Achieved    Target Date 03/11/20             PT Long Term Goals - 03/31/20 1139      PT LONG TERM GOAL #1   Title Patient will report at least 75% improvement in symptoms for improved quality of life.    Time 6    Period Weeks    Status On-going      PT LONG TERM GOAL #2   Title Patient will improve FOTO score by at least 5 points in order to indicate improved tolerance to activity.    Time 6    Period Weeks    Status On-going      PT LONG TERM GOAL #3   Title Patient will be able to complete 5x STS in under 11.4 seconds in order to reduce the risk of falls.    Time 6    Period Weeks    Status On-going      PT LONG TERM GOAL #4   Title Patient will be able to ambulate for at least 6 minutes with pain no greater than 1/10 in  order to demonstrate improved ability to ambulate in the community.    Time 6    Period Weeks     Status Achieved                 Plan - 04/02/20 1052    Clinical Impression Statement Patient starts off in lower 80 SPM on nu step today but improves to upper 80 after about 5 minutes on nustep at beginning of session. Completed mat exercises today for an emphasis on core and isolated hip muscle strengthening. Patient given cueing for prior glute set with bridges secondary to c/o back pain. Patient requires cueing for limiting mobility to hip ROM only for hip abductor/ER strengthening. Patient showing improving activity tolerance but requires frequent rest breaks due to SOB. Patient will continue to benefit from skilled physical therapy in order to reduce impairment and improve function.    Personal Factors and Comorbidities Comorbidity 3+;Fitness;Age;Past/Current Experience;Time since onset of injury/illness/exacerbation    Comorbidities Allergies, Arthritis, Asthma, Back pain, BMI over 30, Cancer, Chronic ObstructivePulmonary Disease, Diabetes Type  II, Gastrointestinal Disease, High Blood Pressure, Incontinence,Pacemaker,    Examination-Activity Limitations Locomotion Level;Transfers;Stand;Stairs;Squat;Lift;Bend    Examination-Participation Restrictions Cleaning;Meal Prep;Church;Community Activity;Shop;Volunteer;[facility]    Stability/Clinical Decision Making Evolving/Moderate complexity    Rehab Potential Fair    PT Frequency 2x / week    PT Duration 4 weeks    PT Treatment/Interventions ADLs/Self Care Home Management;Aquatic Therapy;Canalith Repostioning;Cryotherapy;Electrical Stimulation;Iontophoresis 4mg /ml Dexamethasone;Traction;Moist Heat;Ultrasound;DME Instruction;Gait training;Stair training;Functional mobility training;Therapeutic exercise;Balance training;Therapeutic activities;Neuromuscular re-education;Patient/family education;Orthotic Fit/Training;Manual techniques;Manual lymph drainage;Compression bandaging;Passive range of motion;Dry needling;Energy  conservation;Splinting;Taping;Spinal Manipulations;Joint Manipulations;Vasopneumatic Device    PT Next Visit Plan progress LE functional strengthing and exercise tolerance.    PT Home Exercise Plan 11/2 STS 12/13 Row, extension    Consulted and Agree with Plan of Care Patient           Patient will benefit from skilled therapeutic intervention in order to improve the following deficits and impairments:  Abnormal gait,Difficulty walking,Decreased range of motion,Decreased endurance,Pain,Decreased activity tolerance,Decreased balance,Improper body mechanics,Impaired flexibility,Postural dysfunction,Decreased strength,Decreased mobility  Visit Diagnosis: Low back pain, unspecified back pain laterality, unspecified chronicity, unspecified whether sciatica present  Other symptoms and signs involving the musculoskeletal system  Other abnormalities of gait and mobility  Muscle weakness (generalized)     Problem List Patient Active Problem List   Diagnosis Date Noted  . 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 Chronic constipation 12/04/2019  . Pacemaker 07/13/2017  . 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/2017  . 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 Heart murmur 05/31/2016  . Primary osteoarthritis of first carpometacarpal joint of left hand 06/12/2015  . Fatty liver disease, nonalcoholic 28/31/5176  . Lymphedema 02/18/2015  . Leg cramps 08/02/2014  . Ganglion cyst of flexor tendon sheath of finger of left hand 08/02/2014  . Skin irritation 01/19/2014  . Post-operative state 11/14/2013  . Atypical ductal hyperplasia of left breast 07/12/2013  . Endometrial polyp 06/25/2013  . Rectocele 06/25/2013  . Diabetic neuropathy ([place]) 05/09/2013  . Pre-ulcerative calluses 05/09/2013  . Bladder prolapse, female, acquired 05/09/2013  . Pain, joint, multiple sites 03/26/2013  . 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 Insomnia 11/29/2012  . Hip  pain 09/08/2012  . Rotator cuff syndrome of left shoulder 06/21/2012  . Peripheral edema 02/04/2012  . HSV (herpes simplex virus) infection 12/09/2011  . Dyspnea 11/15/2011  . 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  Essential hypertension, benign 10/11/2011  . DM (diabetes mellitus) ([place]) 10/11/2011  . OA (osteoarthritis) 10/11/2011  . Class 2 obesity 10/11/2011  . Asthma 10/11/2011  . Hyperlipidemia 10/11/2011  . Chronic fatigue 10/11/2011  . OSA (obstructive sleep apnea) 10/11/2011    11:43 AM, 04/02/20 Mearl Latin PT, DPT Physical Therapist at [place], [place], [address] Phone: [phone]   Fax:  [phone]  Name: Julie Clay MRN: [id number] Date of Birth: 30-Oct-1940

## 2020-04-03 ENCOUNTER — Other Ambulatory Visit: Payer: Self-pay | Admitting: Family Medicine

## 2020-04-04 NOTE — Progress Notes (Signed)
Remote pacemaker transmission.   

## 2020-04-07 ENCOUNTER — Other Ambulatory Visit: Payer: Self-pay

## 2020-04-07 ENCOUNTER — Ambulatory Visit (HOSPITAL_COMMUNITY): Payer: Medicare Other | Admitting: Physical Therapy

## 2020-04-07 ENCOUNTER — Encounter (HOSPITAL_COMMUNITY): Payer: Self-pay | Admitting: Physical Therapy

## 2020-04-07 DIAGNOSIS — M545 Low back pain, unspecified: Secondary | ICD-10-CM

## 2020-04-07 DIAGNOSIS — R2689 Other abnormalities of gait and mobility: Secondary | ICD-10-CM

## 2020-04-07 DIAGNOSIS — R29898 Other symptoms and signs involving the musculoskeletal system: Secondary | ICD-10-CM | POA: Diagnosis not present

## 2020-04-07 DIAGNOSIS — M6281 Muscle weakness (generalized): Secondary | ICD-10-CM

## 2020-04-07 NOTE — Patient Instructions (Signed)
Access Code: [email or web address] URL: [email or web address] Date: 04/07/2020 Prepared by: Sherlyn Lees  Exercises Supine Transversus Abdominis Bracing - Hands on Stomach - 1 x daily - 7 x weekly - 3 sets - 10 reps - 3 hold Hooklying Isometric Hip Flexion with Opposite Arm - 1 x daily - 7 x weekly - 3 sets - 10 reps

## 2020-04-07 NOTE — Therapy (Signed)
Julie Clay, [place], [address] Phone: [phone]   Fax:  [phone]  Physical Therapy Treatment  Patient Details  Name: Julie Clay MRN: [id number] Date of Birth: 01-01-1941 Referring Provider (PT): Chriss Czar PA-C   Encounter Date: 04/07/2020   PT End of Session - 04/07/20 1054    Visit Number 11    Number of Visits 20    Date for PT Re-Evaluation 04/28/20    Authorization Type Primary: Medicare Secondary BCBS (No auth, vl 17)    Authorization - Visit Number 10    Authorization - Number of Visits 50    Progress Note Due on Visit 41    PT Start Time 1049    PT Stop Time 1129    PT Time Calculation (min) 40 min    Activity Tolerance Patient tolerated treatment well    Behavior During Therapy WFL for tasks assessed/performed           Past Medical History:  Diagnosis Date   Arthritis    Asthma    Bone spur    Breast cancer, left breast ([place])    Breast cancer, right breast ([place])    Chronic bronchitis ([place])    COPD (chronic obstructive pulmonary disease) ([place])    Diastolic heart failure ([place])    Essential hypertension    Fatty liver 2013   GERD (gastroesophageal reflux disease)    HSV (herpes simplex virus) infection    Hyperlipidemia    Lung nodule seen on imaging study 2013   Lymphedema    Left arm   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    Osteoarthritis    Type II diabetes mellitus ([place])    Ventral hernia     Past Surgical History:  Procedure Laterality Date   BREAST BIOPSY Left 04/2011   BREAST BIOPSY Left 06/22/13   BREAST BIOPSY Left 09/12/2013   Procedure: BREAST BIOPSY WITH NEEDLE LOCALIZATION;  Surgeon: Jamesetta So, MD;  Location: [facility];  Service: General;  Laterality: Left;   BREAST LUMPECTOMY Left 04/2011 X 2   BREAST SURGERY N/A    Phreesia 01/17/2020   CATARACT EXTRACTION W/ INTRAOCULAR LENS  IMPLANT, BILATERAL Bilateral    CATARACT  EXTRACTION, BILATERAL     on different occassions    CESAREAN SECTION  1973   COLON SURGERY N/A    Phreesia 01/17/2020   DILATION AND CURETTAGE OF UTERUS  11/06/2013   HAND LIGAMENT RECONSTRUCTION Right ~ 2010   HYSTEROSCOPY WITH D & C N/A 11/06/2013   Procedure: DILATATION AND CURETTAGE /HYSTEROSCOPY;  Surgeon: Jonnie Kind, MD;  Location: [facility];  Service: Gynecology;  Laterality: N/A;   INSERT / REPLACE / REMOVE PACEMAKER  06/22/2017   LUNG LOBECTOMY Right 1988   Fungal Infection   PACEMAKER IMPLANT N/A 06/22/2017   Procedure: PACEMAKER IMPLANT;  Surgeon: Deboraha Sprang, MD;  Location: [place] [facility];  Service: Cardiovascular;  Laterality: N/A;   PARTIAL MASTECTOMY WITH NEEDLE LOCALIZATION Right 01/02/2020   Procedure: RIGHT PARTIAL MASTECTOMY AFTER NEEDLE LOCALIZATION;  Surgeon: Aviva Signs, MD;  Location: [facility];  Service: General;  Laterality: Right;   POLYPECTOMY N/A 11/06/2013   Procedure: POLYPECTOMY (REMOVAL ENDOMETRIAL POLYP);  Surgeon: Jonnie Kind, MD;  Location: [facility];  Service: Gynecology;  Laterality: N/A;   RECTOCELE REPAIR N/A 11/06/2013   Procedure: POSTERIOR REPAIR (RECTOCELE);  Surgeon: Jonnie Kind, MD;  Location: [facility];  Service:  Gynecology;  Laterality: N/A;   SHOULDER ARTHROSCOPY WITH OPEN ROTATOR CUFF REPAIR Left 07/26/2012   TONSILLECTOMY  1949   TUBAL LIGATION      There were no vitals filed for this visit.   Subjective Assessment - 04/07/20 1051    Subjective Patient reports she is feeling better overall with chief complaint being stiffness in the morning and reports pain is much improved and her overall activity level has increased    Currently in Pain? No/denies              [facility] PT Assessment - 04/07/20 0001      Assessment   Medical Diagnosis LBP/ Posture    Referring Provider (PT) Chriss Czar PA-C    Onset Date/Surgical Date 02/18/14                         [facility] Adult PT Treatment/Exercise -  04/07/20 0001      Lumbar Exercises: Stretches   Lower Trunk Rotation Other (comment)   2 min   Other Lumbar Stretch Exercise forward trunk flexion sitting x 2 min      Lumbar Exercises: Aerobic   Nustep UE/LE seat and UE's at 10; 10 minutes at  80-90 SPM level 2 for dynamic warm up and improving activity tolerance      Lumbar Exercises: Standing   Row Strengthening;Both;10 reps   3 sets   Theraband Level (Row) Level 3 (Green)    Shoulder Extension Strengthening;10 reps   3 sets   Theraband Level (Shoulder Extension) Level 3 (Green)    Other Standing Lumbar Exercises sit to stand   2x10 (1x10 with 8 lbs)     Lumbar Exercises: Supine   Ab Set 20 reps;3 seconds    Bent Knee Raise 10 reps;2 seconds   2 sets, hip flexion isometric in supine                 PT Education - 04/07/20 1125    Education Details Pt educated on HEP additions for abdominal bracing/stabilization to train/generalize for upright posture. Patient educated in activity pacing and modification at home to improve standing tolerance    Person(s) Educated Patient    Methods Explanation    Comprehension Verbalized understanding;Returned demonstration            PT Short Term Goals - 03/31/20 1139      PT SHORT TERM GOAL #1   Title Patient will be independent with HEP in order to improve functional outcomes.    Time 3    Period Weeks    Status On-going    Target Date 03/11/20      PT SHORT TERM GOAL #2   Title Patient will report at least 25% improvement in symptoms for improved quality of life.    Time 3    Period Weeks    Status Achieved    Target Date 03/11/20             PT Long Term Goals - 03/31/20 1139      PT LONG TERM GOAL #1   Title Patient will report at least 75% improvement in symptoms for improved quality of life.    Time 6    Period Weeks    Status On-going      PT LONG TERM GOAL #2   Title Patient will improve FOTO score by at least 5 points in order to indicate improved  tolerance to activity.    Time  6    Period Weeks    Status On-going      PT LONG TERM GOAL #3   Title Patient will be able to complete 5x STS in under 11.4 seconds in order to reduce the risk of falls.    Time 6    Period Weeks    Status On-going      PT LONG TERM GOAL #4   Title Patient will be able to ambulate for at least 6 minutes with pain no greater than 1/10 in order to demonstrate improved ability to ambulate in the community.    Time 6    Period Weeks    Status Achieved                 Plan - 04/07/20 1055    Clinical Impression Statement Patient tolerating increased activity levels without adverse effects.  Cues for providing postural alignment and core engagement during standing maneuvers to improve proximal stability. Continue with POC to improve core/trunk strength to incorporate in functional lifts/squats. Patient and therapist discussed her obtaining membership at [facility] for fitness classes and continued strengthening program. Will instruct patient in machine set-up to prepare for transition to facility-based program    Personal Factors and Comorbidities Comorbidity 3+;Fitness;Age;Past/Current Experience;Time since onset of injury/illness/exacerbation    Comorbidities Allergies, Arthritis, Asthma, Back pain, BMI over 30, Cancer, Chronic ObstructivePulmonary Disease, Diabetes Type  II, Gastrointestinal Disease, High Blood Pressure, Incontinence,Pacemaker,    Examination-Activity Limitations Locomotion Level;Transfers;Stand;Stairs;Squat;Lift;Bend    Examination-Participation Restrictions Cleaning;Meal Prep;Church;Community Activity;Shop;Volunteer;[facility]    Stability/Clinical Decision Making Evolving/Moderate complexity    Rehab Potential Fair    PT Frequency 2x / week    PT Duration 4 weeks    PT Treatment/Interventions ADLs/Self Care Home Management;Aquatic Therapy;Canalith Repostioning;Cryotherapy;Electrical Stimulation;Iontophoresis 4mg /ml  Dexamethasone;Traction;Moist Heat;Ultrasound;DME Instruction;Gait training;Stair training;Functional mobility training;Therapeutic exercise;Balance training;Therapeutic activities;Neuromuscular re-education;Patient/family education;Orthotic Fit/Training;Manual techniques;Manual lymph drainage;Compression bandaging;Passive range of motion;Dry needling;Energy conservation;Splinting;Taping;Spinal Manipulations;Joint Manipulations;Vasopneumatic Device    PT Next Visit Plan progress LE functional strengthing and exercise tolerance.    PT Home Exercise Plan 11/2 STS 12/13 Row, extension    Consulted and Agree with Plan of Care Patient           Patient will benefit from skilled therapeutic intervention in order to improve the following deficits and impairments:  Abnormal gait,Difficulty walking,Decreased range of motion,Decreased endurance,Pain,Decreased activity tolerance,Decreased balance,Improper body mechanics,Impaired flexibility,Postural dysfunction,Decreased strength,Decreased mobility  Visit Diagnosis: Low back pain, unspecified back pain laterality, unspecified chronicity, unspecified whether sciatica present  Other symptoms and signs involving the musculoskeletal system  Other abnormalities of gait and mobility  Muscle weakness (generalized)     Problem List Patient Active Problem List   Diagnosis Date Noted   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 ([place])    Chronic constipation 12/04/2019   Pacemaker 07/13/2017   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/2017   2nd degree AV block 06/22/2017   Heart murmur 05/31/2016   Primary osteoarthritis of first carpometacarpal joint of left hand 06/12/2015   Fatty liver disease, nonalcoholic 38/25/0539   Lymphedema 02/18/2015   Leg cramps 08/02/2014   Ganglion cyst of flexor tendon sheath of finger of left hand 08/02/2014   Skin irritation 01/19/2014   Post-operative state 11/14/2013   Atypical  ductal hyperplasia of left breast 07/12/2013   Endometrial polyp 06/25/2013   Rectocele 06/25/2013   Diabetic neuropathy ([place]) 05/09/2013   Pre-ulcerative calluses 05/09/2013   Bladder prolapse, female, acquired 05/09/2013   Pain, joint, multiple  sites 03/26/2013   CKD (chronic kidney disease), stage II 03/26/2013   Insomnia 11/29/2012   Hip pain 09/08/2012   Rotator cuff syndrome of left shoulder 06/21/2012   Peripheral edema 02/04/2012   HSV (herpes simplex virus) infection 12/09/2011   Dyspnea 11/15/2011   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 Sherlyn Lees, PT, DPT Physical Therapist- [place] Office Number: [phone]  [place] [address] [place], [place], [address] Phone: [phone]   Fax:  [phone]  Name: Julie Clay MRN: [id number] Date of Birth: 11-10-1940

## 2020-04-08 ENCOUNTER — Other Ambulatory Visit: Payer: Medicare Other

## 2020-04-08 DIAGNOSIS — E782 Mixed hyperlipidemia: Secondary | ICD-10-CM | POA: Diagnosis not present

## 2020-04-08 DIAGNOSIS — K760 Fatty (change of) liver, not elsewhere classified: Secondary | ICD-10-CM | POA: Diagnosis not present

## 2020-04-08 DIAGNOSIS — I10 Essential (primary) hypertension: Secondary | ICD-10-CM

## 2020-04-08 DIAGNOSIS — N182 Chronic kidney disease, stage 2 (mild): Secondary | ICD-10-CM

## 2020-04-08 DIAGNOSIS — E1143 Type 2 diabetes mellitus with diabetic autonomic (poly)neuropathy: Secondary | ICD-10-CM

## 2020-04-09 ENCOUNTER — Other Ambulatory Visit: Payer: Self-pay

## 2020-04-09 ENCOUNTER — Encounter: Payer: Self-pay | Admitting: Family Medicine

## 2020-04-09 ENCOUNTER — Ambulatory Visit (INDEPENDENT_AMBULATORY_CARE_PROVIDER_SITE_OTHER): Payer: Medicare Other | Admitting: Family Medicine

## 2020-04-09 VITALS — BP 140/60 | HR 98 | Temp 97.5°F | Ht 65.0 in | Wt 227.0 lb

## 2020-04-09 DIAGNOSIS — E1141 Type 2 diabetes mellitus with diabetic mononeuropathy: Secondary | ICD-10-CM | POA: Diagnosis not present

## 2020-04-09 DIAGNOSIS — L84 Corns and callosities: Secondary | ICD-10-CM | POA: Diagnosis not present

## 2020-04-09 DIAGNOSIS — N182 Chronic kidney disease, stage 2 (mild): Secondary | ICD-10-CM | POA: Diagnosis not present

## 2020-04-09 DIAGNOSIS — Z0001 Encounter for general adult medical examination with abnormal findings: Secondary | ICD-10-CM | POA: Diagnosis not present

## 2020-04-09 DIAGNOSIS — E1143 Type 2 diabetes mellitus with diabetic autonomic (poly)neuropathy: Secondary | ICD-10-CM | POA: Diagnosis not present

## 2020-04-09 DIAGNOSIS — I890 Lymphedema, not elsewhere classified: Secondary | ICD-10-CM | POA: Diagnosis not present

## 2020-04-09 DIAGNOSIS — K219 Gastro-esophageal reflux disease without esophagitis: Secondary | ICD-10-CM

## 2020-04-09 DIAGNOSIS — E782 Mixed hyperlipidemia: Secondary | ICD-10-CM | POA: Diagnosis not present

## 2020-04-09 DIAGNOSIS — Z0000 Encounter for general adult medical examination without abnormal findings: Secondary | ICD-10-CM

## 2020-04-09 DIAGNOSIS — H9191 Unspecified hearing loss, right ear: Secondary | ICD-10-CM

## 2020-04-09 DIAGNOSIS — E669 Obesity, unspecified: Secondary | ICD-10-CM

## 2020-04-09 DIAGNOSIS — Z800 Family history of malignant neoplasm of digestive organs: Secondary | ICD-10-CM

## 2020-04-09 LAB — CBC WITH DIFFERENTIAL/PLATELET
Absolute Monocytes: 689 cells/uL (ref 200–950)
Basophils Absolute: 41 cells/uL (ref 0–200)
Basophils Relative: 0.5 %
Eosinophils Absolute: 267 cells/uL (ref 15–500)
Eosinophils Relative: 3.3 %
HCT: 39.2 % (ref 35.0–45.0)
Hemoglobin: 13.1 g/dL (ref 11.7–15.5)
Lymphs Abs: 2381 cells/uL (ref 850–3900)
MCH: 27.8 pg (ref 27.0–33.0)
MCHC: 33.4 g/dL (ref 32.0–36.0)
MCV: 83.2 fL (ref 80.0–100.0)
MPV: 11.3 fL (ref 7.5–12.5)
Monocytes Relative: 8.5 %
Neutro Abs: 4722 cells/uL (ref 1500–7800)
Neutrophils Relative %: 58.3 %
Platelets: 225 10*3/uL (ref 140–400)
RBC: 4.71 10*6/uL (ref 3.80–5.10)
RDW: 13.3 % (ref 11.0–15.0)
Total Lymphocyte: 29.4 %
WBC: 8.1 10*3/uL (ref 3.8–10.8)

## 2020-04-09 LAB — COMPLETE METABOLIC PANEL WITH GFR
AG Ratio: 2.1 (calc) (ref 1.0–2.5)
ALT: 12 U/L (ref 6–29)
AST: 17 U/L (ref 10–35)
Albumin: 4 g/dL (ref 3.6–5.1)
Alkaline phosphatase (APISO): 42 U/L (ref 37–153)
BUN: 18 mg/dL (ref 7–25)
CO2: 28 mmol/L (ref 20–32)
Calcium: 9.5 mg/dL (ref 8.6–10.4)
Chloride: 107 mmol/L (ref 98–110)
Creat: 0.79 mg/dL (ref 0.60–0.93)
GFR, Est African American: 84 mL/min/{1.73_m2} (ref >=60)
GFR, Est Non African American: 72 mL/min/{1.73_m2} (ref >=60)
Globulin: 1.9 g/dL (calc) (ref 1.9–3.7)
Glucose, Bld: 106 mg/dL — ABNORMAL HIGH (ref 65–99)
Potassium: 4.4 mmol/L (ref 3.5–5.3)
Sodium: 143 mmol/L (ref 135–146)
Total Bilirubin: 0.4 mg/dL (ref 0.2–1.2)
Total Protein: 5.9 g/dL — ABNORMAL LOW (ref 6.1–8.1)

## 2020-04-09 LAB — HEMOGLOBIN A1C
Hgb A1c MFr Bld: 6.7 % of total Hgb — ABNORMAL HIGH (ref <5.7)
Mean Plasma Glucose: 146 mg/dL
eAG (mmol/L): 8.1 mmol/L

## 2020-04-09 LAB — LIPID PANEL
Cholesterol: 128 mg/dL (ref <200)
HDL: 47 mg/dL — ABNORMAL LOW (ref >=50)
LDL Cholesterol (Calc): 53 mg/dL (calc)
Non-HDL Cholesterol (Calc): 81 mg/dL (calc) (ref <130)
Total CHOL/HDL Ratio: 2.7 (calc) (ref <5.0)
Triglycerides: 228 mg/dL — ABNORMAL HIGH (ref <150)

## 2020-04-09 MED ORDER — ALBUTEROL SULFATE HFA 108 (90 BASE) MCG/ACT IN AERS: 2.0000 | INHALATION_SPRAY | Freq: Four times a day (QID) | RESPIRATORY_TRACT | 0 refills | Status: AC | PRN

## 2020-04-09 NOTE — Progress Notes (Signed)
Subjective:   Patient presents for Medicare Annual/Subsequent preventive examination.   Patient here for wellness visit.  Her recent labs were reviewed.  Diabetes mellitus her A1c returned at 6.7%.  She is taking her medicines as prescribed no hypoglycemia symptoms.  States that she feels good.  She is in need of new diabetic shoes.  She has had some reflux symptoms, she is concerned about H pylori, has had multiple family members with gastric cancer, she treats with baking soda and water,occ pepcid  She continues to have issues with bowels, sometimes constipated other times loose, has gas, wants to try probiotics before going to GI  Noticed some decreased hearing in her right ear, wants to have this checked      [employer] for shoes   Review Past Medical/Family/Social: per EMR    Risk Factors  Current exercise habits: none Dietary issues discussed: YES  Cardiac risk factors: Obesity (BMI >= 30 kg/m2). DM, HTN, CKD   Depression Screen  (Note: if answer to either of the following is "Yes", a more complete depression screening is indicated)  Over the past two weeks, have you felt down, depressed or hopeless? No Over the past two weeks, have you felt little interest or pleasure in doing things? No Have you lost interest or pleasure in daily life? No Do you often feel hopeless? No Do you cry easily over simple problems? No   Activities of Daily Living  In your present state of health, do you have any difficulty performing the following activities?:  Driving? No  Managing money? No  Feeding yourself? No  Getting from bed to chair? No  Climbing a flight of stairs? Yes Preparing food and eating?: No  Bathing or showering? No  Getting dressed: No  Getting to the toilet? No  Using the toilet:No  Moving around from place to place: No  In the past year have you fallen or had a near fall?:Yes Are you sexually active? No  Do you have more than one partner? No   Hearing  Difficulties: Yes right ear   Do you often ask people to speak up or repeat themselves? Yes Do you experience ringing or noises in your ears? No Do you have difficulty understanding soft or whispered voices?Yes  Do you feel that you have a problem with memory? No Do you often misplace items? No  Do you feel safe at home? Yes  Cognitive Testing  Alert? Yes Normal Appearance?Yes  Oriented to person? Yes Place? Yes  Time? Yes  Recall of three objects? Yes  Can perform simple calculations? Yes  Displays appropriate judgment?Yes  Can read the correct time from a watch face?Yes   List the Names of Other Physician/Practitioners you currently use:  Orthopedics, Cardiology, Eye Doctor , Oncology  Screening Tests / Date Colonoscopy     DUE                Zostavax  - UTD Pneumonia- UTD Mammogram - UTD  Influenza Vaccine  UTD Tetanus/tdap Declines COVID 19   ROS: GEN- denies fatigue, fever, weight loss,weakness, recent illness HEENT- denies eye drainage, change in vision, nasal discharge, CVS- denies chest pain, palpitations RESP- denies SOB, cough, wheeze ABD- denies N/V, change in stools, abd pain GU- denies dysuria, hematuria, dribbling, incontinence MSK- denies joint pain, muscle aches, injury Neuro- denies headache, +dizziness, syncope, seizure activity  Physical: Vitals reviewed GEN- NAD, alert and oriented x3 HEENT- PERRL, EOMI, non injected sclera, pink conjunctiva, MMM, oropharynx clear Neck-  Supple, no thryomegaly, no bruit, CVS- RRR, no murmur RESP-CTAB ABD-NABS,soft,NT,ND,  Neuro-CNII-XII in tact no focal deficits  EXT-trace ankle edema , lymphedema left UE Pulses- Radial, DP- 2+     Assessment:    Annual wellness medicare exam   Plan:    During the course of the visit the patient was educated and counseled about appropriate screening and preventive services including:   Pt declined info on advanced directives    DM- A1C at goal, no changes to meds,  diabetic shoes ordered  Hypertension well controlled  Fatty liver disease her cholesterol is much improved her liver function are normal  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 Yes ____ Not Indicated __x__  Patient Instructions (the written plan) was given to the patient.  Medicare Attestation  I have personally reviewed:  The patient's medical and social history  Their use of alcohol, tobacco or illicit drugs  Their current medications and supplements  The patient's functional ability including ADLs,fall risks, home safety risks, cognitive, and hearing and visual impairment  Diet and physical activities  Evidence for depression or mood disorders  The patient's weight, height, BMI, and visual acuity have been recorded in the chart. I have made referrals, counseling, and provided education to the patient based on review of the above and I have provided the patient with a written personalized care plan for preventive services.

## 2020-04-09 NOTE — Patient Instructions (Addendum)
We will call with results of breath test We will send orders for your diabetic shoes  Okay to get Booster shots Albuterol to be sent Probiotics- RESTORA, ALIGN, Acidophilus  F/U 4 months

## 2020-04-10 LAB — H. PYLORI BREATH TEST: H. pylori Breath Test: NOT DETECTED

## 2020-04-14 ENCOUNTER — Ambulatory Visit (HOSPITAL_COMMUNITY): Payer: Medicare Other | Admitting: Physical Therapy

## 2020-04-14 ENCOUNTER — Encounter (HOSPITAL_COMMUNITY): Payer: Self-pay | Admitting: Physical Therapy

## 2020-04-14 ENCOUNTER — Other Ambulatory Visit: Payer: Self-pay

## 2020-04-14 DIAGNOSIS — R29898 Other symptoms and signs involving the musculoskeletal system: Secondary | ICD-10-CM | POA: Diagnosis not present

## 2020-04-14 DIAGNOSIS — R2689 Other abnormalities of gait and mobility: Secondary | ICD-10-CM | POA: Diagnosis not present

## 2020-04-14 DIAGNOSIS — M545 Low back pain, unspecified: Secondary | ICD-10-CM | POA: Diagnosis not present

## 2020-04-14 DIAGNOSIS — M6281 Muscle weakness (generalized): Secondary | ICD-10-CM | POA: Diagnosis not present

## 2020-04-14 NOTE — Therapy (Signed)
[place], [place], [address] Phone: [phone]   Fax:  [phone]  Physical Therapy Treatment  Patient Details  Name: Julie Clay MRN: [id number] Date of Birth: 01/12/1941 Referring Provider (PT): Chriss Czar PA-C   Encounter Date: 04/14/2020   PT End of Session - 04/14/20 1044    Visit Number 12    Number of Visits 20    Date for PT Re-Evaluation 04/28/20    Authorization Type Primary: Medicare Secondary BCBS (No auth, vl 63)    Authorization - Visit Number 12    Authorization - Number of Visits 50    Progress Note Due on Visit 19    PT Start Time 1046    PT Stop Time 1126    PT Time Calculation (min) 40 min    Activity Tolerance Patient tolerated treatment well    Behavior During Therapy [facility] for tasks assessed/performed           Past Medical History:  Diagnosis Date  . Arthritis   . Asthma   . Bone spur   . Breast cancer, left breast ([place])   . Breast cancer, right breast ([place])   . Chronic bronchitis ([place])   . COPD (chronic obstructive pulmonary disease) ([place])   . Diabetes mellitus without complication ([place])    Phreesia 04/08/2020  . Diastolic heart failure ([place])   . Essential hypertension   . Fatty liver 2013  . GERD (gastroesophageal reflux disease)   . HSV (herpes simplex virus) infection   . Hyperlipidemia   . Lung nodule seen on imaging study 2013  . Lymphedema    Left arm  . 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 Osteoarthritis   . Type II diabetes mellitus ([place])   . Ventral hernia     Past Surgical History:  Procedure Laterality Date  . BREAST BIOPSY Left 04/2011  . BREAST BIOPSY Left 06/22/13  . BREAST BIOPSY Left 09/12/2013   Procedure: BREAST BIOPSY WITH NEEDLE LOCALIZATION;  Surgeon: Jamesetta So, MD;  Location: [facility];  Service: General;  Laterality: Left;  . BREAST LUMPECTOMY Left 04/2011 X 2  . BREAST SURGERY N/A    Phreesia 01/17/2020  . CATARACT  EXTRACTION W/ INTRAOCULAR LENS  IMPLANT, BILATERAL Bilateral   . CATARACT EXTRACTION, BILATERAL     on different occassions   . [place]  . COLON SURGERY N/A    Phreesia 01/17/2020  . DILATION AND CURETTAGE OF UTERUS  11/06/2013  . HAND LIGAMENT RECONSTRUCTION Right ~ 2010  . HYSTEROSCOPY WITH D & C N/A 11/06/2013   Procedure: DILATATION AND CURETTAGE /HYSTEROSCOPY;  Surgeon: Jonnie Kind, MD;  Location: [facility];  Service: Gynecology;  Laterality: N/A;  . INSERT / REPLACE / REMOVE PACEMAKER  06/22/2017  . LUNG LOBECTOMY Right 1988   Fungal Infection  . PACEMAKER IMPLANT N/A 06/22/2017   Procedure: PACEMAKER IMPLANT;  Surgeon: Deboraha Sprang, MD;  Location: [facility];  Service: Cardiovascular;  Laterality: N/A;  . PARTIAL MASTECTOMY WITH NEEDLE LOCALIZATION Right 01/02/2020   Procedure: RIGHT PARTIAL MASTECTOMY AFTER NEEDLE LOCALIZATION;  Surgeon: Aviva Signs, MD;  Location: [facility];  Service: General;  Laterality: Right;  . POLYPECTOMY N/A 11/06/2013   Procedure: POLYPECTOMY (REMOVAL ENDOMETRIAL POLYP);  Surgeon: Jonnie Kind, MD;  Location: [facility];  Service: Gynecology;  Laterality: N/A;  . RECTOCELE REPAIR N/A 11/06/2013   Procedure: POSTERIOR REPAIR (RECTOCELE);  Surgeon: Jonnie Kind, MD;  Location: [facility];  Service: Gynecology;  Laterality: N/A;  . SHOULDER ARTHROSCOPY WITH OPEN ROTATOR CUFF REPAIR Left 07/26/2012  . TONSILLECTOMY  1949  . TUBAL LIGATION      There were no vitals filed for this visit.   Subjective Assessment - 04/14/20 1046    Subjective Patient states she was so sore after last session. Her back as been painful and it was hard for her to get around.    Currently in Pain? No/denies                             [facility] Adult PT Treatment/Exercise - 04/14/20 0001      Lumbar Exercises: Aerobic   Nustep UE/LE seat and UE's at 10; 10 minutes at  80-90 SPM level 2 for dynamic warm up and improving activity tolerance       Lumbar Exercises: Standing   Row Strengthening;Both;10 reps    Theraband Level (Row) Level 3 (Green)    Row Limitations 2 sets    Shoulder Extension Strengthening;10 reps    Theraband Level (Shoulder Extension) Level 3 (Green)    Shoulder Extension Limitations 2 sets    Other Standing Lumbar Exercises hip abduction with green band at feet 2x10 bilateral    Other Standing Lumbar Exercises palof lateral stepping 5x bilateral                  PT Education - 04/14/20 1046    Education Details Patient educated on HEP, exercise mechanics    Person(s) Educated Patient    Methods Explanation;Demonstration    Comprehension Verbalized understanding;Returned demonstration            PT Short Term Goals - 03/31/20 1139      PT SHORT TERM GOAL #1   Title Patient will be independent with HEP in order to improve functional outcomes.    Time 3    Period Weeks    Status On-going    Target Date 03/11/20      PT SHORT TERM GOAL #2   Title Patient will report at least 25% improvement in symptoms for improved quality of life.    Time 3    Period Weeks    Status Achieved    Target Date 03/11/20             PT Long Term Goals - 03/31/20 1139      PT LONG TERM GOAL #1   Title Patient will report at least 75% improvement in symptoms for improved quality of life.    Time 6    Period Weeks    Status On-going      PT LONG TERM GOAL #2   Title Patient will improve FOTO score by at least 5 points in order to indicate improved tolerance to activity.    Time 6    Period Weeks    Status On-going      PT LONG TERM GOAL #3   Title Patient will be able to complete 5x STS in under 11.4 seconds in order to reduce the risk of falls.    Time 6    Period Weeks    Status On-going      PT LONG TERM GOAL #4   Title Patient will be able to ambulate for at least 6 minutes with pain no greater than 1/10 in order to demonstrate improved ability to ambulate in the community.  Time 6     Period Weeks    Status Achieved                 Plan - 04/14/20 1045    Clinical Impression Statement Patient began session with nu step for dynamic warm up and improving activity tolerance with heat applied to low back due to c/o soreness. Patient showing improving activity tolerance today with nu step and averages 89 SPM compared to low/mid 80s in prior sessions. Completed less reps of most exercises performed last time as increased intensity likely contributed to recent increase in symptoms. Patient requires cueing for proper mechanics and positioning with hip abduction exercise with fair carry over. Patient will continue to benefit from skilled physical therapy in order to reduce impairment and improve function.    Personal Factors and Comorbidities Comorbidity 3+;Fitness;Age;Past/Current Experience;Time since onset of injury/illness/exacerbation    Comorbidities Allergies, Arthritis, Asthma, Back pain, BMI over 30, Cancer, Chronic ObstructivePulmonary Disease, Diabetes Type  II, Gastrointestinal Disease, High Blood Pressure, Incontinence,Pacemaker,    Examination-Activity Limitations Locomotion Level;Transfers;Stand;Stairs;Squat;Lift;Bend    Examination-Participation Restrictions Cleaning;Meal Prep;Church;Community Activity;Shop;Volunteer;[facility]    Stability/Clinical Decision Making Evolving/Moderate complexity    Rehab Potential Fair    PT Frequency 2x / week    PT Duration 4 weeks    PT Treatment/Interventions ADLs/Self Care Home Management;Aquatic Therapy;Canalith Repostioning;Cryotherapy;Electrical Stimulation;Iontophoresis 4mg /ml Dexamethasone;Traction;Moist Heat;Ultrasound;DME Instruction;Gait training;Stair training;Functional mobility training;Therapeutic exercise;Balance training;Therapeutic activities;Neuromuscular re-education;Patient/family education;Orthotic Fit/Training;Manual techniques;Manual lymph drainage;Compression bandaging;Passive range of motion;Dry  needling;Energy conservation;Splinting;Taping;Spinal Manipulations;Joint Manipulations;Vasopneumatic Device    PT Next Visit Plan progress LE functional strengthing and exercise tolerance.    PT Home Exercise Plan 11/2 STS 12/13 Row, extension    Consulted and Agree with Plan of Care Patient           Patient will benefit from skilled therapeutic intervention in order to improve the following deficits and impairments:  Abnormal gait,Difficulty walking,Decreased range of motion,Decreased endurance,Pain,Decreased activity tolerance,Decreased balance,Improper body mechanics,Impaired flexibility,Postural dysfunction,Decreased strength,Decreased mobility  Visit Diagnosis: Low back pain, unspecified back pain laterality, unspecified chronicity, unspecified whether sciatica present  Other symptoms and signs involving the musculoskeletal system  Other abnormalities of gait and mobility  Muscle weakness (generalized)     Problem List Patient Active Problem List   Diagnosis Date Noted  . 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 Chronic constipation 12/04/2019  . Pacemaker 07/13/2017  . 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/2017  . 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 Heart murmur 05/31/2016  . Primary osteoarthritis of first carpometacarpal joint of left hand 06/12/2015  . Fatty liver disease, nonalcoholic 69/67/8938  . Lymphedema 02/18/2015  . Leg cramps 08/02/2014  . Ganglion cyst of flexor tendon sheath of finger of left hand 08/02/2014  . Skin irritation 01/19/2014  . Post-operative state 11/14/2013  . Atypical ductal hyperplasia of left breast 07/12/2013  . Endometrial polyp 06/25/2013  . Rectocele 06/25/2013  . Diabetic neuropathy ([place]) 05/09/2013  . Pre-ulcerative calluses 05/09/2013  . Bladder prolapse, female, acquired 05/09/2013  . Pain, joint, multiple sites 03/26/2013  . 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 Insomnia  11/29/2012  . Hip pain 09/08/2012  . Rotator cuff syndrome of left shoulder 06/21/2012  . Peripheral edema 02/04/2012  . HSV (herpes simplex virus) infection 12/09/2011  . Dyspnea 11/15/2011  . 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 Essential hypertension, benign 10/11/2011  . DM (diabetes mellitus) ([place]) 10/11/2011  .  OA (osteoarthritis) 10/11/2011  . Class 2 obesity 10/11/2011  . Asthma 10/11/2011  . Hyperlipidemia 10/11/2011  . Chronic fatigue 10/11/2011  . OSA (obstructive sleep apnea) 10/11/2011    11:28 AM, 04/14/20 Mearl Latin PT, DPT Physical Therapist at [facility] [place], [place], [address] Phone: [phone]   Fax:  [phone]  Name: Julie Clay MRN: [id number] Date of Birth: April 05, 1941

## 2020-04-21 ENCOUNTER — Other Ambulatory Visit: Payer: Self-pay | Admitting: Family Medicine

## 2020-04-21 ENCOUNTER — Ambulatory Visit (HOSPITAL_COMMUNITY): Payer: Medicare Other | Admitting: Physical Therapy

## 2020-04-23 ENCOUNTER — Other Ambulatory Visit: Payer: Self-pay

## 2020-04-23 ENCOUNTER — Encounter (HOSPITAL_COMMUNITY): Payer: Self-pay | Admitting: Physical Therapy

## 2020-04-23 ENCOUNTER — Ambulatory Visit (HOSPITAL_COMMUNITY): Payer: Medicare Other | Attending: Physician Assistant | Admitting: Physical Therapy

## 2020-04-23 DIAGNOSIS — M545 Low back pain, unspecified: Secondary | ICD-10-CM | POA: Insufficient documentation

## 2020-04-23 DIAGNOSIS — R29898 Other symptoms and signs involving the musculoskeletal system: Secondary | ICD-10-CM | POA: Insufficient documentation

## 2020-04-23 DIAGNOSIS — R2689 Other abnormalities of gait and mobility: Secondary | ICD-10-CM | POA: Insufficient documentation

## 2020-04-23 DIAGNOSIS — M6281 Muscle weakness (generalized): Secondary | ICD-10-CM | POA: Diagnosis not present

## 2020-04-23 NOTE — Therapy (Signed)
[place], [place], [address] Phone: [phone]   Fax:  [phone]  Physical Therapy Treatment/Discharge Summary  Patient Details  Name: Julie Clay MRN: [id number] Date of Birth: 19-Apr-1941 Referring Provider (PT): Chriss Czar PA-C   Encounter Date: 04/23/2020   PHYSICAL THERAPY DISCHARGE SUMMARY  Visits from Start of Care: 13  Current functional level related to goals / functional outcomes: See below   Remaining deficits: See below   Education / Equipment: See below  Plan: Patient agrees to discharge.  Patient goals were partially met. Patient is being discharged due to                                                     ?????will complete HEP at home        PT End of Session - 04/23/20 1049    Visit Number 13    Number of Visits 20    Date for PT Re-Evaluation 04/28/20    Authorization Type Primary: Medicare Secondary BCBS (No auth, vl 50)    Authorization - Visit Number 13    Authorization - Number of Visits 50    Progress Note Due on Visit 19    PT Start Time 1049    PT Stop Time 1139    PT Time Calculation (min) 50 min    Activity Tolerance Patient tolerated treatment well    Behavior During Therapy WFL for tasks assessed/performed           Past Medical History:  Diagnosis Date  . Arthritis   . Asthma   . Bone spur   . Breast cancer, left breast ([place])   . Breast cancer, right breast ([place])   . Chronic bronchitis ([place])   . COPD (chronic obstructive pulmonary disease) ([place])   . Diabetes mellitus without complication ([place])    Phreesia 04/08/2020  . Diastolic heart failure ([place])   . Essential hypertension   . Fatty liver 2013  . GERD (gastroesophageal reflux disease)   . HSV (herpes simplex virus) infection   . Hyperlipidemia   . Lung nodule seen on imaging study 2013  . Lymphedema    Left arm  . 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 Osteoarthritis   . Type II  diabetes mellitus ([place])   . Ventral hernia     Past Surgical History:  Procedure Laterality Date  . BREAST BIOPSY Left 04/2011  . BREAST BIOPSY Left 06/22/13  . BREAST BIOPSY Left 09/12/2013   Procedure: BREAST BIOPSY WITH NEEDLE LOCALIZATION;  Surgeon: Jamesetta So, MD;  Location: [facility];  Service: General;  Laterality: Left;  . BREAST LUMPECTOMY Left 04/2011 X 2  . BREAST SURGERY N/A    Phreesia 01/17/2020  . CATARACT EXTRACTION W/ INTRAOCULAR LENS  IMPLANT, BILATERAL Bilateral   . CATARACT EXTRACTION, BILATERAL     on different occassions   . [place]  . COLON SURGERY N/A    Phreesia 01/17/2020  . DILATION AND CURETTAGE OF UTERUS  11/06/2013  . HAND LIGAMENT RECONSTRUCTION Right ~ 2010  . HYSTEROSCOPY WITH D & C N/A 11/06/2013   Procedure: DILATATION AND CURETTAGE /HYSTEROSCOPY;  Surgeon: Jonnie Kind, MD;  Location: [facility];  Service: Gynecology;  Laterality: N/A;  . INSERT /  REPLACE / REMOVE PACEMAKER  06/22/2017  . LUNG LOBECTOMY Right 1988   Fungal Infection  . PACEMAKER IMPLANT N/A 06/22/2017   Procedure: PACEMAKER IMPLANT;  Surgeon: Deboraha Sprang, MD;  Location: [facility];  Service: Cardiovascular;  Laterality: N/A;  . PARTIAL MASTECTOMY WITH NEEDLE LOCALIZATION Right 01/02/2020   Procedure: RIGHT PARTIAL MASTECTOMY AFTER NEEDLE LOCALIZATION;  Surgeon: Aviva Signs, MD;  Location: [facility];  Service: General;  Laterality: Right;  . POLYPECTOMY N/A 11/06/2013   Procedure: POLYPECTOMY (REMOVAL ENDOMETRIAL POLYP);  Surgeon: Jonnie Kind, MD;  Location: [facility];  Service: Gynecology;  Laterality: N/A;  . RECTOCELE REPAIR N/A 11/06/2013   Procedure: POSTERIOR REPAIR (RECTOCELE);  Surgeon: Jonnie Kind, MD;  Location: [facility];  Service: Gynecology;  Laterality: N/A;  . SHOULDER ARTHROSCOPY WITH OPEN ROTATOR CUFF REPAIR Left 07/26/2012  . TONSILLECTOMY  1949  . TUBAL LIGATION      There were no vitals filed for this visit.   Subjective Assessment -  04/23/20 1050    Subjective Patient states she didnt feel like coming out in the snow last time. She feels about 60% improvement since beginning therapy.    How long can you walk comfortably? 1 hour with support    Currently in Pain? No/denies              [facility] PT Assessment - 04/23/20 0001      Assessment   Medical Diagnosis LBP/ Posture    Referring Provider (PT) Chriss Czar PA-C    Onset Date/Surgical Date 02/18/14    Next MD Visit none scheduled      Precautions   Precautions None      Restrictions   Weight Bearing Restrictions No      Balance Screen   Has the patient fallen in the past 6 months No    Has the patient had a decrease in activity level because of a fear of falling?  No    Is the patient reluctant to leave their home because of a fear of falling?  No      Prior Function   Level of Independence Independent with basic ADLs    Vocation Retired    [employer] Retired - worked at [employer]   Overall Cognitive Status Within [employer] for tasks assessed      Observation/Other Assessments   Observations Ambulates without AD    Focus on Therapeutic Outcomes (FOTO)  63% limited      Transfers   Five time sit to stand comments  15.78 seconds without UE support                         [facility] Adult PT Treatment/Exercise - 04/23/20 0001      Lumbar Exercises: Aerobic   Nustep UE/LE seat and UE's at 10; 10 minutes at  85- 90 SPM level 2 for dynamic warm up and improving activity tolerance      Lumbar Exercises: Standing   Row Strengthening;Both;10 reps    Theraband Level (Row) Level 3 (Green)    Row Limitations 1 set    Shoulder Extension AROM;Both;15 reps    Theraband Level (Shoulder Extension) Level 3 (Green)    Shoulder Extension Limitations 1 set    Other Standing Lumbar Exercises step up 1x 10 bilateral, lateral stepping 4x10 feet    Other Standing Lumbar Exercises palof lateral stepping 5x bilateral  PT Education - 04/23/20 1050    Education Details Patient educated on HEP, exercise mechanics, returning to physical therapy if needed, how to improve stamina/endurance, joining [employer], walking program    Person(s) Educated Patient;Spouse    Methods Explanation;Demonstration    Comprehension Verbalized understanding;Returned demonstration            PT Short Term Goals - 04/23/20 1222      PT SHORT TERM GOAL #1   Title Patient will be independent with HEP in order to improve functional outcomes.    Time 3    Period Weeks    Status Not Met    Target Date 03/11/20      PT SHORT TERM GOAL #2   Title Patient will report at least 25% improvement in symptoms for improved quality of life.    Time 3    Period Weeks    Status Achieved    Target Date 03/11/20             PT Long Term Goals - 04/23/20 1222      PT LONG TERM GOAL #1   Title Patient will report at least 75% improvement in symptoms for improved quality of life.    Time 6    Period Weeks    Status Not Met      PT LONG TERM GOAL #2   Title Patient will improve FOTO score by at least 5 points in order to indicate improved tolerance to activity.    Time 6    Period Weeks    Status Not Met      PT LONG TERM GOAL #3   Title Patient will be able to complete 5x STS in under 11.4 seconds in order to reduce the risk of falls.    Time 6    Period Weeks    Status Not Met      PT LONG TERM GOAL #4   Title Patient will be able to ambulate for at least 6 minutes with pain no greater than 1/10 in order to demonstrate improved ability to ambulate in the community.    Time 6    Period Weeks    Status Achieved                 Plan - 04/23/20 1049    Clinical Impression Statement Patient's husband present for today's session and is educated along with patient today as seen in education section. Patient has met 1/2 short term goals with improvement in symptoms and endurance. Patient continues  to remain limited by symptoms, back pain, activity tolerance, endurance leading to remaining goals not met. Discussed patient's progress with her and her husband and educated extensively on how to improve endurance and gradual progression of exercise. Patient given [employer] handout and is educated on importance movement/exercise. Patient discharged from physical therapy at this time.    Personal Factors and Comorbidities Comorbidity 3+;Fitness;Age;Past/Current Experience;Time since onset of injury/illness/exacerbation    Comorbidities Allergies, Arthritis, Asthma, Back pain, BMI over 30, Cancer, Chronic ObstructivePulmonary Disease, Diabetes Type  II, Gastrointestinal Disease, High Blood Pressure, Incontinence,Pacemaker,    Examination-Activity Limitations Locomotion Level;Transfers;Stand;Stairs;Squat;Lift;Bend    Examination-Participation Restrictions Cleaning;Meal Prep;Church;Community Activity;Shop;Volunteer;Dorita Sciara    Stability/Clinical Decision Making Evolving/Moderate complexity    Rehab Potential Fair    PT Frequency --    PT Duration --    PT Treatment/Interventions ADLs/Self Care Home Management;Aquatic Therapy;Canalith Repostioning;Cryotherapy;Electrical Stimulation;Iontophoresis 54m/ml Dexamethasone;Traction;Moist Heat;Ultrasound;DME Instruction;Gait training;Stair training;Functional mobility training;Therapeutic exercise;Balance training;Therapeutic activities;Neuromuscular re-education;Patient/family education;Orthotic Fit/Training;Manual techniques;Manual lymph  drainage;Compression bandaging;Passive range of motion;Dry needling;Energy conservation;Splinting;Taping;Spinal Manipulations;Joint Manipulations;Vasopneumatic Device    PT Next Visit Plan progress LE functional strengthing and exercise tolerance.    PT Home Exercise Plan 11/2 STS 12/13 Row, extension 1/5 lateral stepping, step up    Consulted and Agree with Plan of Care Patient           Patient will benefit from  skilled therapeutic intervention in order to improve the following deficits and impairments:  Abnormal gait,Difficulty walking,Decreased range of motion,Decreased endurance,Pain,Decreased activity tolerance,Decreased balance,Improper body mechanics,Impaired flexibility,Postural dysfunction,Decreased strength,Decreased mobility  Visit Diagnosis: Low back pain, unspecified back pain laterality, unspecified chronicity, unspecified whether sciatica present  Other symptoms and signs involving the musculoskeletal system  Other abnormalities of gait and mobility  Muscle weakness (generalized)     Problem List Patient Active Problem List   Diagnosis Date Noted  . 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 Chronic constipation 12/04/2019  . Pacemaker 07/13/2017  . 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/2017  . 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 Heart murmur 05/31/2016  . Primary osteoarthritis of first carpometacarpal joint of left hand 06/12/2015  . Fatty liver disease, nonalcoholic 27/10/8673  . Lymphedema 02/18/2015  . Leg cramps 08/02/2014  . Ganglion cyst of flexor tendon sheath of finger of left hand 08/02/2014  . Skin irritation 01/19/2014  . Post-operative state 11/14/2013  . Atypical ductal hyperplasia of left breast 07/12/2013  . Endometrial polyp 06/25/2013  . Rectocele 06/25/2013  . Diabetic neuropathy ([place]) 05/09/2013  . Pre-ulcerative calluses 05/09/2013  . Bladder prolapse, female, acquired 05/09/2013  . Pain, joint, multiple sites 03/26/2013  . 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 Insomnia 11/29/2012  . Hip pain 09/08/2012  . Rotator cuff syndrome of left shoulder 06/21/2012  . Peripheral edema 02/04/2012  . HSV (herpes simplex virus) infection 12/09/2011  . Dyspnea 11/15/2011  . 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 Essential hypertension, benign 10/11/2011  . DM (diabetes mellitus)  ([place]) 10/11/2011  . OA (osteoarthritis) 10/11/2011  . Class 2 obesity 10/11/2011  . Asthma 10/11/2011  . Hyperlipidemia 10/11/2011  . Chronic fatigue 10/11/2011  . OSA (obstructive sleep apnea) 10/11/2011    12:29 PM, 04/23/20 Mearl Latin PT, DPT Physical Therapist at [place], [place], [address] Phone: [phone]   Fax:  [phone]  Name: CHONITA GADEA MRN: [id number] Date of Birth: 1940-07-28

## 2020-04-24 DIAGNOSIS — H1045 Other chronic allergic conjunctivitis: Secondary | ICD-10-CM | POA: Diagnosis not present

## 2020-04-28 ENCOUNTER — Other Ambulatory Visit: Payer: Self-pay

## 2020-04-28 MED ORDER — METOPROLOL SUCCINATE ER 25 MG PO TB24
12.5000 mg | ORAL_TABLET | Freq: Every day | ORAL | 3 refills | Status: DC
Start: 2020-04-28 — End: 2021-04-27

## 2020-04-28 NOTE — Telephone Encounter (Signed)
Refilled toprol xl 12.5 mg qd

## 2020-05-06 ENCOUNTER — Telehealth: Payer: Self-pay | Admitting: Pharmacist

## 2020-05-06 NOTE — Progress Notes (Addendum)
Chronic Care Management Pharmacy Assistant   Name: Julie Clay  MRN: [id number] DOB: 15-Jan-1941  Reason for Encounter: Disease State for DM.  Patient Questions:  1.  Have you seen any other providers since your last visit? Yes.   2.  Any changes in your medicines or health? Yes.   PCP : Alycia Rossetti, MD   Their chronic conditions include: HTN, Asthma, Diabetes w/ neuropathy, CKD, HLD.  Office Visits: 04/09/20 Dr. Buelah Manis routine check up. No medication changes.  Consults: 04/23/20 Outpatient Rehab Patient stated she feels about 60% improvement since the first therapy session.  12/27/21Outpatient Rehab Patient stated she has been sore since the last session. No medication changes.  04/07/20 Outpatient Rehab Patient stated she is feeling better overall still stiff in the morning. No medication changes. 12/15/21Outpatient Rehab. No changes. No medication changes. 12/13/21Outpatient Rehab.Patient stated she is doing more actives and her SOB is getting better. No medication changes. 12/06/21Outpatient Rehab. Patient stated she got a injection in her knee because of her pain. No medication changes.  03/19/20 Outpatient Rehab Patient stated she has stiffness in the morning. No medication changes.  03/17/20 Outpatient Rehab Patient voices she is doing some exercises at home. No medication changes.  03/12/20 Outpatient Rehab Patient voice soreness. No medication changes. 03/10/20 Outpatient Rehab Improving very well, Still SOB. No medication changes.  02/27/20 Outpatient Rehab.Able to stand. No medication changes.  02/25/20 Outpatient Rehab Started Sit to stand exercises. No medication changes.  02/19/20 Outpatient Rehab Background discussion. No medication changes.  02/08/20 Erin Fulling, [place] No information given. 02/07/20 Oncology Dr. Delton Coombes . No medication changes. 01/25/20 Cardio Dr. Domenic Polite No medication changes.   Allergies:   Allergies  Allergen Reactions    Arimidex [Anastrozole] Other (See Comments)    Arthralgias and myalgias.  Improved with 3 week hiatus from drug.  Categorical side effect of drug class.   Other Hives and Itching   Adhesive [Tape] Other (See Comments)    "red and blistered"   Lisinopril     Dry hacking cough and tickle   Tetanus Toxoids    Tetracyclines & Related Other (See Comments)    unknown    Medications: Outpatient Encounter Medications as of 05/06/2020  Medication Sig   acetaminophen (TYLENOL) 500 MG tablet Take 1,000 mg by mouth every 6 (six) hours as needed for moderate pain.    albuterol (VENTOLIN HFA) 108 (90 Base) MCG/ACT inhaler Inhale 2 puffs into the lungs every 6 (six) hours as needed for wheezing or shortness of breath.   Aloe Vera Leaf POWD Take 800 mg by mouth 2 (two) times daily.    Ascorbic Acid (VITAMIN C) 1000 MG tablet Take 1,000 mg by mouth 2 (two) times daily.   b complex vitamins tablet Take 1 tablet by mouth 2 (two) times daily.    Blood Glucose Monitoring Suppl (BLOOD GLUCOSE SYSTEM PAK) KIT Use as directed to monitor FSBS 1x daily. Dx: E11.9. Please dispense as Accu-Chek Aviva   Calcium Polycarbophil (FIBER-CAPS PO) Take by mouth every other day.   Cholecalciferol (VITAMIN D3) 2000 UNITS TABS Take 2,000 Units by mouth 2 (two) times daily.   fluocinonide cream (LIDEX) 7.62 % Apply 1 application topically 2 (two) times daily as needed (poison ivy/bites/hives).   Ginkgo Biloba 120 MG CAPS Take 120 mg by mouth in the morning and at bedtime.    glipiZIDE (GLUCOTROL) 10 MG tablet TAKE 1/2 (ONE-HALF) TABLET BY MOUTH TWICE DAILY BEFORE A MEAL FOR DIABETES  Glucose Blood (BLOOD GLUCOSE TEST STRIPS) STRP Use as directed to monitor FSBS 1x daily. Dx: E11.9. Please dispense as Accu-Chek Aviva   Krill Oil 1000 MG CAPS Take 1,000 mg by mouth 2 (two) times daily.    Lancets MISC Use as directed to monitor FSBS 1x daily. Dx: E11.9. Please dispense as Accu-Chek Aviva   LORazepam (ATIVAN) 0.5 MG tablet Take 1  tablet (0.5 mg total) by mouth 2 (two) times daily as needed for anxiety.   melatonin 5 MG TABS Take 5 mg by mouth at bedtime as needed (sleep).    metFORMIN (GLUCOPHAGE) 1000 MG tablet TAKE 1 TABLET BY MOUTH TWICE DAILY WITH A MEAL FOR DIABETES   metoprolol succinate (TOPROL-XL) 25 MG 24 hr tablet Take 0.5 tablets (12.5 mg total) by mouth daily.   Multiple Vitamin (MULTIVITAMIN WITH MINERALS) TABS tablet Take 1 tablet by mouth daily.   naftifine (NAFTIN) 1 % cream Apply topically 2 (two) times daily as needed.   rosuvastatin (CRESTOR) 5 MG tablet TAKE 1 TABLET BY MOUTH ONCE DAILY FOR CHOLESTEROL   simethicone (MYLICON) 80 MG chewable tablet Chew 80 mg by mouth as needed for flatulence.    tiZANidine (ZANAFLEX) 4 MG tablet TAKE 1/2 (ONE-HALF) TABLET BY MOUTH EVERY 6 HOURS AS NEEDED FOR MUSCLE SPASM   torsemide (DEMADEX) 20 MG tablet Take 10 mg by mouth daily as needed (swelling).    TRULICITY 5.70 VX/7.9TJ SOPN INJECT 0.75 MG SUBCUTANEOUSLY ONCE A WEEK   No facility-administered encounter medications on file as of 05/06/2020.    Current Diagnosis: Patient Active Problem List   Diagnosis Date Noted   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 ([place])    Chronic constipation 12/04/2019   Pacemaker 07/13/2017   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/2017   2nd degree AV block 06/22/2017   Heart murmur 05/31/2016   Primary osteoarthritis of first carpometacarpal joint of left hand 06/12/2015   Fatty liver disease, nonalcoholic 03/00/9233   Lymphedema 02/18/2015   Leg cramps 08/02/2014   Ganglion cyst of flexor tendon sheath of finger of left hand 08/02/2014   Skin irritation 01/19/2014   Post-operative state 11/14/2013   Atypical ductal hyperplasia of left breast 07/12/2013   Endometrial polyp 06/25/2013   Rectocele 06/25/2013   Diabetic neuropathy ([place]) 05/09/2013   Pre-ulcerative calluses 05/09/2013   Bladder prolapse, female, acquired 05/09/2013   Pain, joint,  multiple sites 03/26/2013   CKD (chronic kidney disease), stage II 03/26/2013   Insomnia 11/29/2012   Hip pain 09/08/2012   Rotator cuff syndrome of left shoulder 06/21/2012   Peripheral edema 02/04/2012   HSV (herpes simplex virus) infection 12/09/2011   Dyspnea 11/15/2011   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 ([place]) 10/11/2011   Essential hypertension, benign 10/11/2011   DM (diabetes mellitus) ([place]) 10/11/2011   OA (osteoarthritis) 10/11/2011   Class 2 obesity 10/11/2011   Asthma 10/11/2011   Hyperlipidemia 10/11/2011   Chronic fatigue 10/11/2011   OSA (obstructive sleep apnea) 10/11/2011    Goals Addressed   None    Recent Relevant Labs: Lab Results  Component Value Date/Time   HGBA1C 6.7 (H) 04/08/2020 09:25 AM   HGBA1C 6.3 (H) 11/30/2019 10:32 AM   MICROALBUR <0.2 07/23/2019 02:24 PM   MICROALBUR 1.3 04/25/2018 10:48 AM    Kidney Function Lab Results  Component Value Date/Time   CREATININE 0.79 04/08/2020 09:25 AM   CREATININE 0.84 01/18/2020 12:00 AM   GFRNONAA 72 04/08/2020 09:25 AM  GFRAA 84 04/08/2020 09:25 AM    Current antihyperglycemic regimen:  Trulicity 9.39 mg once weekly Glipizide 10 mg one-half tablet twice daily Metformin 1000 mg twice daily  What recent interventions/DTPs have been made to improve glycemic control:  None.  Have there been any recent hospitalizations or ED visits since last visit with CPP? No.  Patient reports hypoglycemic symptoms, including Nervous/irritable   Patient reports hyperglycemic symptoms, including none   How often are you checking your blood sugar? Patient's husband stated she checks her blood sugar 3-4 times a week and doesn't write them down.   What are your blood sugars ranging?  Fasting: N/A Before meals: N/A After meals: N/A Bedtime: N/A  During the week, how often does your blood glucose drop below 70? Patients husband stated its  Never dropped that low.   Are you checking your  feet daily/regularly?  Patients husband stated he checks her feet daily and everything is fine.   Adherence Review: Is the patient currently on a STATIN medication? Yes, Rosuvastatin 5 mg.  Is the patient currently on ACE/ARB medication? No.    Does the patient have >5 day gap between last estimated fill dates? No, CPP Please Check.  Patients husband stated they go to the [facility] 3 times a week. He stated she uses [facility] for all of her medications and currently there is not any problems or concerns about her medication.    Follow-Up:  Pharmacist Review   Charlann Lange, RMA Clinical Pharmacist Assistant [phone]  5 minutes spent in review, coordination, and documentation.  Reviewed by: Beverly Milch, PharmD Clinical Pharmacist [facility] [phone]

## 2020-05-14 ENCOUNTER — Telehealth: Payer: Self-pay | Admitting: Family Medicine

## 2020-05-14 NOTE — Telephone Encounter (Signed)
Call pt and verify what she is requesting I assume she wants a referral to [facility]   I also assume this is due to her constipation /bowel issues, GERD If not I need to know the reason  From our records she hasnt seen GI since 2018 ( Dr. Laural Golden office)  and that was for the fatty liver

## 2020-05-15 NOTE — Telephone Encounter (Signed)
Call placed to patient. LMTRC.  

## 2020-05-19 ENCOUNTER — Other Ambulatory Visit: Payer: Self-pay | Admitting: Family Medicine

## 2020-05-19 NOTE — Telephone Encounter (Signed)
Call placed to patient. LMTRC.  

## 2020-05-31 ENCOUNTER — Other Ambulatory Visit: Payer: Self-pay | Admitting: Family Medicine

## 2020-06-04 ENCOUNTER — Telehealth: Payer: Self-pay | Admitting: *Deleted

## 2020-06-04 NOTE — Telephone Encounter (Signed)
Received VM from patient.   Reports that letter from was received from PCP in regards to her leaving office. Inquired as to how they can establish within office.   Dr Dennard Schaumann is unable to accept new patients at this time.   PCP advised to call one of the providers on the list with the letter to establish.   Verbalized understanding.

## 2020-06-16 ENCOUNTER — Other Ambulatory Visit: Payer: Self-pay | Admitting: Family Medicine

## 2020-06-17 ENCOUNTER — Other Ambulatory Visit: Payer: Self-pay | Admitting: Family Medicine

## 2020-06-24 ENCOUNTER — Ambulatory Visit (INDEPENDENT_AMBULATORY_CARE_PROVIDER_SITE_OTHER): Payer: Medicare Other

## 2020-06-24 DIAGNOSIS — I441 Atrioventricular block, second degree: Secondary | ICD-10-CM

## 2020-06-24 LAB — CUP PACEART REMOTE DEVICE CHECK
Battery Remaining Longevity: 100 mo
Battery Remaining Percentage: 95.5 %
Battery Voltage: 2.99 V
Brady Statistic AP VP Percent: 2.4 %
Brady Statistic AP VS Percent: 1 %
Brady Statistic AS VP Percent: 98 %
Brady Statistic AS VS Percent: 1 %
Brady Statistic RA Percent Paced: 2.3 %
Brady Statistic RV Percent Paced: 99 %
Date Time Interrogation Session: 20220308023130
Implantable Lead Implant Date: 20190306
Implantable Lead Implant Date: 20190306
Implantable Lead Location: 753859
Implantable Lead Location: 753860
Implantable Lead Model: 5076
Implantable Lead Model: 5076
Implantable Pulse Generator Implant Date: 20190306
Lead Channel Impedance Value: 340 Ohm
Lead Channel Impedance Value: 410 Ohm
Lead Channel Pacing Threshold Amplitude: 0.5 V
Lead Channel Pacing Threshold Amplitude: 0.75 V
Lead Channel Pacing Threshold Pulse Width: 0.5 ms
Lead Channel Pacing Threshold Pulse Width: 0.5 ms
Lead Channel Sensing Intrinsic Amplitude: 4.1 mV
Lead Channel Sensing Intrinsic Amplitude: 5.6 mV
Lead Channel Setting Pacing Amplitude: 2 V
Lead Channel Setting Pacing Amplitude: 2.5 V
Lead Channel Setting Pacing Pulse Width: 0.5 ms
Lead Channel Setting Sensing Sensitivity: 2 mV
Pulse Gen Model: 2272
Pulse Gen Serial Number: 9001027

## 2020-06-25 ENCOUNTER — Ambulatory Visit (INDEPENDENT_AMBULATORY_CARE_PROVIDER_SITE_OTHER): Payer: Medicare Other | Admitting: Nurse Practitioner

## 2020-06-25 ENCOUNTER — Encounter: Payer: Self-pay | Admitting: Nurse Practitioner

## 2020-06-25 ENCOUNTER — Other Ambulatory Visit: Payer: Self-pay

## 2020-06-25 VITALS — BP 140/82 | HR 98 | Temp 97.9°F | Resp 22 | Ht 65.0 in | Wt 229.0 lb

## 2020-06-25 DIAGNOSIS — K760 Fatty (change of) liver, not elsewhere classified: Secondary | ICD-10-CM

## 2020-06-25 DIAGNOSIS — I890 Lymphedema, not elsewhere classified: Secondary | ICD-10-CM | POA: Diagnosis not present

## 2020-06-25 DIAGNOSIS — Z7689 Persons encountering health services in other specified circumstances: Secondary | ICD-10-CM | POA: Diagnosis not present

## 2020-06-25 DIAGNOSIS — K5909 Other constipation: Secondary | ICD-10-CM | POA: Diagnosis not present

## 2020-06-25 DIAGNOSIS — I441 Atrioventricular block, second degree: Secondary | ICD-10-CM

## 2020-06-25 DIAGNOSIS — K219 Gastro-esophageal reflux disease without esophagitis: Secondary | ICD-10-CM

## 2020-06-25 DIAGNOSIS — E1143 Type 2 diabetes mellitus with diabetic autonomic (poly)neuropathy: Secondary | ICD-10-CM | POA: Diagnosis not present

## 2020-06-25 MED ORDER — TRULICITY 0.75 MG/0.5ML ~~LOC~~ SOAJ
0.7500 mg | SUBCUTANEOUS | 5 refills | Status: DC
Start: 2020-06-25 — End: 2020-11-07

## 2020-06-25 NOTE — Assessment & Plan Note (Addendum)
Lab Results  Component Value Date   HGBA1C 6.7 (H) 04/08/2020  -takes metformin 1000 mg BID -takes glipizide 5 mg BID before meals -takes weekly trulicity -needs foot exam and wants custom orthotics

## 2020-06-25 NOTE — Progress Notes (Signed)
New Patient Office Visit  Subjective:  Patient ID: Julie Clay, female    DOB: 1940/12/07  Age: 80 y.o. MRN: [id number]  CC:  Chief Complaint  Patient presents with  . New Patient (Initial Visit)    HPI Julie Clay presents for new patient visit. Transferring care from Dr. Buelah Manis. Last physical was 04/09/20, and her last labs were drawn at that time.  She has occasional diarrhea. This started 3 years ago. She alternates between constipation and diarrhea. She would like referral to Dr. Alessandra Bevels with [facility]. She has seen [facility] for GI diseases in the past. She has tried fiber and probiotics, but that isn't making much of a difference.  She would like custom orthotics for her diabetic shoes.  Her guy that made custom shoes is only making orthotics for amputees.  She has lymphedema to left arm after left shoulder surgery. Pacemaker to right upper chest.  Past Medical History:  Diagnosis Date  . Arthritis   . Asthma   . Bone spur   . Breast cancer, left breast ([place])   . Breast cancer, right breast ([place])   . Chronic bronchitis ([place])   . COPD (chronic obstructive pulmonary disease) ([place])   . Diabetes mellitus without complication ([place])    Phreesia 04/08/2020  . Diastolic heart failure ([place])   . Essential hypertension   . Fatty liver 2013  . GERD (gastroesophageal reflux disease)   . HSV (herpes simplex virus) infection   . Hyperlipidemia   . Lung nodule seen on imaging study 2013  . Lymphedema    Left arm  . 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 Osteoarthritis   . Type II diabetes mellitus ([place])   . Ventral hernia     Past Surgical History:  Procedure Laterality Date  . BREAST BIOPSY Left 04/2011  . BREAST BIOPSY Left 06/22/13  . BREAST BIOPSY Left 09/12/2013   Procedure: BREAST BIOPSY WITH NEEDLE LOCALIZATION;  Surgeon: Jamesetta So, MD;  Location: [facility];  Service: General;  Laterality: Left;  . BREAST LUMPECTOMY Left 04/2011 X 2  .  BREAST SURGERY N/A    Phreesia 01/17/2020  . CATARACT EXTRACTION W/ INTRAOCULAR LENS  IMPLANT, BILATERAL Bilateral   . CATARACT EXTRACTION, BILATERAL     on different occassions   . [place]  . COLON SURGERY N/A    Phreesia 01/17/2020  . DILATION AND CURETTAGE OF UTERUS  11/06/2013  . HAND LIGAMENT RECONSTRUCTION Right ~ 2010  . HYSTEROSCOPY WITH D & C N/A 11/06/2013   Procedure: DILATATION AND CURETTAGE /HYSTEROSCOPY;  Surgeon: Jonnie Kind, MD;  Location: [facility];  Service: Gynecology;  Laterality: N/A;  . INSERT / REPLACE / REMOVE PACEMAKER  06/22/2017  . LUNG LOBECTOMY Right 1988   Fungal Infection  . PACEMAKER IMPLANT N/A 06/22/2017   Procedure: PACEMAKER IMPLANT;  Surgeon: Deboraha Sprang, MD;  Location: [place] [facility];  Service: Cardiovascular;  Laterality: N/A;  . PARTIAL MASTECTOMY WITH NEEDLE LOCALIZATION Right 01/02/2020   Procedure: RIGHT PARTIAL MASTECTOMY AFTER NEEDLE LOCALIZATION;  Surgeon: Aviva Signs, MD;  Location: [facility];  Service: General;  Laterality: Right;  . POLYPECTOMY N/A 11/06/2013   Procedure: POLYPECTOMY (REMOVAL ENDOMETRIAL POLYP);  Surgeon: Jonnie Kind, MD;  Location: [facility];  Service: Gynecology;  Laterality: N/A;  . RECTOCELE REPAIR N/A 11/06/2013   Procedure: POSTERIOR REPAIR (RECTOCELE);  Surgeon: Jonnie Kind, MD;  Location: [facility];  Service: Gynecology;  Laterality: N/A;  . SHOULDER ARTHROSCOPY WITH OPEN ROTATOR CUFF REPAIR Left 07/26/2012  . TONSILLECTOMY  1949  . TUBAL LIGATION      Family History  Problem Relation Age of Onset  . Hypertension Mother   . Heart disease Mother   . Diabetes Mother   . Kidney disease Mother        ESRD  . Cancer Mother        Bladder  . Hypertension Father   . Hyperlipidemia Father   . Heart disease Father   . Diabetes Father   . Cancer Father        Stomach Cancer  . Diabetes Sister   . Heart disease Sister   . Alzheimer's disease Paternal Aunt   . Stroke Maternal Grandfather   .  Cancer Paternal Grandfather        stomach  . Cancer Maternal Uncle        prostate  . Cancer Paternal Uncle        stomach    Social History   Socioeconomic History  . Marital status: Married    Spouse name: Not on file  . Number of children: Not on file  . Years of education: Not on file  . Highest education level: Not on file  Occupational History  . 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 Smoking status: Never Smoker  . Smokeless tobacco: Never Used  Vaping Use  . Vaping Use: Never used  Substance and Sexual Activity  . Alcohol use: Yes    Alcohol/week: 0.0 standard drinks    Comment: 06/22/2017 "might have a drink twice a year"  . Drug use: No  . Sexual activity: Not on file  Other Topics Concern  . Not on file  Social History Narrative  . Not on file   Social Determinants of Health   Financial Resource Strain: Low Risk   . Difficulty of Paying Living Expenses: Not very hard  Food Insecurity: Not on file  Transportation Needs: Not on file  Physical Activity: Not on file  Stress: Not on file  Social Connections: Not on file  Intimate Partner Violence: Not on file    ROS Review of Systems  Constitutional: Negative.   Respiratory: Negative.   Cardiovascular: Negative.   Gastrointestinal: Positive for constipation and diarrhea. Negative for nausea and vomiting.  Musculoskeletal: Positive for arthralgias and back pain.  Psychiatric/Behavioral: Negative.     Objective:   Today's Vitals: BP (!) 176/67   Pulse 98   Temp 97.9 F (36.6 C)   Resp (!) 22   Ht 5' 5" (1.651 m)   Wt 229 lb (103.9 kg)   SpO2 94%   BMI 38.11 kg/m   Physical Exam Constitutional:      Appearance: She is obese.  Cardiovascular:     Rate and Rhythm: Normal rate and regular rhythm.     Pulses: Normal pulses.     Heart sounds: Normal heart sounds.  Pulmonary:     Effort: Pulmonary effort is normal.     Breath sounds: Normal  breath sounds.  Musculoskeletal:        General: Normal range of motion.  Neurological:     Mental Status: She is alert.  Psychiatric:        Mood and Affect: Mood normal.        Behavior: Behavior normal.        Thought Content:  Thought content normal.        Judgment: Judgment normal.     Assessment & Plan:   Problem List Items Addressed This Visit   None     Outpatient Encounter Medications as of 06/25/2020  Medication Sig  . acetaminophen (TYLENOL) 500 MG tablet Take 1,000 mg by mouth every 6 (six) hours as needed for moderate pain.   Marland Kitchen albuterol (VENTOLIN HFA) 108 (90 Base) MCG/ACT inhaler Inhale 2 puffs into the lungs every 6 (six) hours as needed for wheezing or shortness of breath.  Vanita Ingles Leaf POWD Take 800 mg by mouth 2 (two) times daily.  . Ascorbic Acid (VITAMIN C) 1000 MG tablet Take 1,000 mg by mouth 2 (two) times daily.  Marland Kitchen b complex vitamins tablet Take 1 tablet by mouth 2 (two) times daily.   . Blood Glucose Monitoring Suppl (BLOOD GLUCOSE SYSTEM PAK) KIT Use as directed to monitor FSBS 1x daily. Dx: E11.9. Please dispense as Accu-Chek Aviva  . Calcium Polycarbophil (FIBER-CAPS PO) Take by mouth every other day.  . Cholecalciferol (VITAMIN D3) 2000 UNITS TABS Take 2,000 Units by mouth 2 (two) times daily.  . fluocinonide cream (LIDEX) 7.61 % Apply 1 application topically 2 (two) times daily as needed (poison ivy/bites/hives).  . Ginkgo Biloba 120 MG CAPS Take 120 mg by mouth in the morning and at bedtime.   Marland Kitchen glipiZIDE (GLUCOTROL) 10 MG tablet TAKE 1/2 (ONE-HALF) TABLET BY MOUTH TWICE DAILY BEFORE A MEAL FOR DIABETES  . Glucose Blood (BLOOD GLUCOSE TEST STRIPS) STRP Use as directed to monitor FSBS 1x daily. Dx: E11.9. Please dispense as Accu-Chek Aviva  . Krill Oil 1000 MG CAPS Take 1,000 mg by mouth 2 (two) times daily.   . Lancets MISC Use as directed to monitor FSBS 1x daily. Dx: E11.9. Please dispense as Accu-Chek Aviva  . LORazepam (ATIVAN) 0.5 MG tablet  Take 1 tablet (0.5 mg total) by mouth 2 (two) times daily as needed for anxiety.  . melatonin 5 MG TABS Take 5 mg by mouth at bedtime as needed (sleep).   . metFORMIN (GLUCOPHAGE) 1000 MG tablet TAKE 1 TABLET BY MOUTH TWICE DAILY WITH A MEAL FOR DIABETES  . metoprolol succinate (TOPROL-XL) 25 MG 24 hr tablet Take 0.5 tablets (12.5 mg total) by mouth daily.  . Multiple Vitamin (MULTIVITAMIN WITH MINERALS) TABS tablet Take 1 tablet by mouth daily.  . naftifine (NAFTIN) 1 % cream Apply topically 2 (two) times daily as needed.  . rosuvastatin (CRESTOR) 5 MG tablet TAKE 1 TABLET BY MOUTH ONCE DAILY FOR CHOLESTEROL  . simethicone (MYLICON) 80 MG chewable tablet Chew 80 mg by mouth as needed for flatulence.   Marland Kitchen tiZANidine (ZANAFLEX) 4 MG tablet TAKE 1/2 (ONE-HALF) TABLET BY MOUTH EVERY 6 HOURS AS NEEDED FOR MUSCLE SPASM  . torsemide (DEMADEX) 20 MG tablet Take 10 mg by mouth daily as needed (swelling).   . TRULICITY 6.07 PX/1.0GY SOPN INJECT 0.75 MG SUBCUTANEOUSLY ONCE A WEEK   No facility-administered encounter medications on file as of 06/25/2020.    Follow-up: No follow-ups on file.   Noreene Larsson, NP

## 2020-06-25 NOTE — Patient Instructions (Signed)
Please have fasting labs drawn 2-3 days prior to your next appointment.

## 2020-06-25 NOTE — Assessment & Plan Note (Signed)
-  pacemaker in place -followed by cardiology

## 2020-07-02 NOTE — Progress Notes (Signed)
Remote pacemaker transmission.   

## 2020-07-08 ENCOUNTER — Encounter: Payer: Self-pay | Admitting: Nurse Practitioner

## 2020-07-08 ENCOUNTER — Other Ambulatory Visit: Payer: Self-pay

## 2020-07-08 ENCOUNTER — Telehealth: Payer: Self-pay

## 2020-07-08 ENCOUNTER — Telehealth (INDEPENDENT_AMBULATORY_CARE_PROVIDER_SITE_OTHER): Payer: Medicare Other | Admitting: Nurse Practitioner

## 2020-07-08 DIAGNOSIS — J329 Chronic sinusitis, unspecified: Secondary | ICD-10-CM | POA: Insufficient documentation

## 2020-07-08 DIAGNOSIS — J019 Acute sinusitis, unspecified: Secondary | ICD-10-CM

## 2020-07-08 MED ORDER — CORICIDIN HBP COUGH/COLD 4-30 MG PO TABS: 1.0000 | ORAL_TABLET | Freq: Four times a day (QID) | ORAL | 0 refills | Status: DC | PRN

## 2020-07-08 NOTE — Telephone Encounter (Signed)
Pt called to advise Covid test was negative

## 2020-07-08 NOTE — Assessment & Plan Note (Signed)
-  Rx. Coricidin -recommended home COVID test; she will call with results -she wil lcall back Thurs/Fri, and we will consider adding abx if she is still symptomatic (?augmentin)

## 2020-07-08 NOTE — Progress Notes (Signed)
Acute Office Visit  Subjective:    Patient ID: Julie Clay, female    DOB: Jan 31, 1941, 80 y.o.   MRN: [id number]  Chief Complaint  Patient presents with  . Sore Throat    Started sat night with sore throat, coughing up clear mucus. No fever or chills     HPI Patient is in today for sore throat. Started 3 days ago.  Past Medical History:  Diagnosis Date  . 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 Arthritis   . Asthma   . Bone spur   . Breast cancer, left breast ([place])    2012  . Breast cancer, right breast ([place])    2021  . Chronic bronchitis ([place])   . COPD (chronic obstructive pulmonary disease) ([place])   . Diabetes mellitus without complication ([place])    Phreesia 04/08/2020  . Diastolic heart failure ([place])   . Essential hypertension   . Fatty liver 2013  . GERD (gastroesophageal reflux disease)   . HSV (herpes simplex virus) infection   . Hyperlipidemia   . Lung nodule seen on imaging study 2013  . Lymphedema    Left arm  . 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 Osteoarthritis    both knees, lower back, both shoulders; bone spurs to feet  . Rectocele 06/25/2013   Posterior repair 11/06/13   . Type II diabetes mellitus ([place])   . Ventral hernia     Past Surgical History:  Procedure Laterality Date  . BREAST BIOPSY Left 04/2011  . BREAST BIOPSY Left 06/22/13  . BREAST BIOPSY Left 09/12/2013   Procedure: BREAST BIOPSY WITH NEEDLE LOCALIZATION;  Surgeon: Jamesetta So, MD;  Location: [facility];  Service: General;  Laterality: Left;  . BREAST LUMPECTOMY Left 04/2011 X 2  . BREAST SURGERY N/A    Phreesia 01/17/2020  . CATARACT EXTRACTION W/ INTRAOCULAR LENS  IMPLANT, BILATERAL Bilateral   . CATARACT EXTRACTION, BILATERAL     on different occassions   . [place]  . COLON SURGERY N/A    2014  . DILATION AND CURETTAGE OF UTERUS  11/06/2013  . HAND LIGAMENT RECONSTRUCTION Right ~ 2010  . HYSTEROSCOPY WITH D & C N/A 11/06/2013   Procedure: DILATATION AND  CURETTAGE /HYSTEROSCOPY;  Surgeon: Jonnie Kind, MD;  Location: [facility];  Service: Gynecology;  Laterality: N/A;  . INSERT / REPLACE / REMOVE PACEMAKER  06/22/2017  . LUNG LOBECTOMY Right 1988   Fungal Infection  . PACEMAKER IMPLANT N/A 06/22/2017   Procedure: PACEMAKER IMPLANT;  Surgeon: Deboraha Sprang, MD;  Location: [place] [facility];  Service: Cardiovascular;  Laterality: N/A;  . PARTIAL MASTECTOMY WITH NEEDLE LOCALIZATION Right 01/02/2020   Procedure: RIGHT PARTIAL MASTECTOMY AFTER NEEDLE LOCALIZATION;  Surgeon: Aviva Signs, MD;  Location: [facility];  Service: General;  Laterality: Right;  . POLYPECTOMY N/A 11/06/2013   Procedure: POLYPECTOMY (REMOVAL ENDOMETRIAL POLYP);  Surgeon: Jonnie Kind, MD;  Location: [facility];  Service: Gynecology;  Laterality: N/A;  . RECTOCELE REPAIR N/A 11/06/2013   Procedure: POSTERIOR REPAIR (RECTOCELE);  Surgeon: Jonnie Kind, MD;  Location: [facility];  Service: Gynecology;  Laterality: N/A;  . SHOULDER ARTHROSCOPY WITH OPEN ROTATOR CUFF REPAIR Left 07/26/2012  . TONSILLECTOMY  1949  . TUBAL LIGATION      Family History  Problem Relation Age of Onset  . Hypertension Mother   . Heart disease Mother   . Diabetes Mother   .  Kidney disease Mother        ESRD  . Cancer Mother        Bladder  . Hypertension Father   . Hyperlipidemia Father   . Heart disease Father   . Diabetes Father   . Cancer Father        Stomach Cancer  . Diabetes Sister   . Heart disease Sister   . Alzheimer's disease Paternal Aunt   . Stroke Maternal Grandfather   . Cancer Paternal Grandfather        stomach  . Cancer Maternal Uncle        prostate  . Cancer Paternal Uncle        stomach    Social History   Socioeconomic History  . Marital status: Married    Spouse name: Not on file  . Number of children: Not on file  . Years of education: Not on file  . Highest education level: Not on file  Occupational History  . Occupation: retired     Comment: Product manager  for [employer]  Tobacco Use  . Smoking status: Never Smoker  . Smokeless tobacco: Never Used  Vaping Use  . Vaping Use: Never used  Substance and Sexual Activity  . Alcohol use: Yes    Alcohol/week: 0.0 standard drinks    Comment: 06/22/2017 "might have a drink twice a year"  . Drug use: No  . Sexual activity: Not on file  Other Topics Concern  . Not on file  Social History Narrative  . Not on file   Social Determinants of Health   Financial Resource Strain: Low Risk   . Difficulty of Paying Living Expenses: Not very hard  Food Insecurity: Not on file  Transportation Needs: Not on file  Physical Activity: Not on file  Stress: Not on file  Social Connections: Not on file  Intimate Partner Violence: Not on file    Outpatient Medications Prior to Visit  Medication Sig Dispense Refill  . acetaminophen (TYLENOL) 500 MG tablet Take 1,000 mg by mouth every 6 (six) hours as needed for moderate pain.     Marland Kitchen albuterol (VENTOLIN HFA) 108 (90 Base) MCG/ACT inhaler Inhale 2 puffs into the lungs every 6 (six) hours as needed for wheezing or shortness of breath. 8 g 0  . Aloe Vera Leaf POWD Take 800 mg by mouth 2 (two) times daily.    . Ascorbic Acid (VITAMIN C) 1000 MG tablet Take 1,000 mg by mouth 2 (two) times daily.    Marland Kitchen b complex vitamins tablet Take 1 tablet by mouth 2 (two) times daily.     . Blood Glucose Monitoring Suppl (BLOOD GLUCOSE SYSTEM PAK) KIT Use as directed to monitor FSBS 1x daily. Dx: E11.9. Please dispense as Accu-Chek Aviva 1 each 1  . Calcium Polycarbophil (FIBER-CAPS PO) Take by mouth every other day.    . Cholecalciferol (VITAMIN D3) 2000 UNITS TABS Take 2,000 Units by mouth 2 (two) times daily.    . Dulaglutide (TRULICITY) 1.19 ER/7.4YC SOPN Inject 0.75 mg into the skin once a week. 3 mL 5  . fluocinonide cream (LIDEX) 1.44 % Apply 1 application topically 2 (two) times daily as needed (poison ivy/bites/hives). 30 g 11  . Ginkgo Biloba 120 MG CAPS Take 120 mg  by mouth in the morning and at bedtime.     Marland Kitchen glipiZIDE (GLUCOTROL) 10 MG tablet TAKE 1/2 (ONE-HALF) TABLET BY MOUTH TWICE DAILY BEFORE A MEAL FOR DIABETES 180 tablet 0  .  Glucose Blood (BLOOD GLUCOSE TEST STRIPS) STRP Use as directed to monitor FSBS 1x daily. Dx: E11.9. Please dispense as Accu-Chek Aviva 100 each 1  . Krill Oil 1000 MG CAPS Take 1,000 mg by mouth 2 (two) times daily.     . Lancets MISC Use as directed to monitor FSBS 1x daily. Dx: E11.9. Please dispense as Accu-Chek Aviva 100 each 1  . LORazepam (ATIVAN) 0.5 MG tablet Take 1 tablet (0.5 mg total) by mouth 2 (two) times daily as needed for anxiety. 30 tablet 1  . melatonin 5 MG TABS Take 5 mg by mouth at bedtime as needed (sleep).     . metFORMIN (GLUCOPHAGE) 1000 MG tablet TAKE 1 TABLET BY MOUTH TWICE DAILY WITH A MEAL FOR DIABETES 180 tablet 0  . metoprolol succinate (TOPROL-XL) 25 MG 24 hr tablet Take 0.5 tablets (12.5 mg total) by mouth daily. 45 tablet 3  . Multiple Vitamin (MULTIVITAMIN WITH MINERALS) TABS tablet Take 1 tablet by mouth daily.    . naftifine (NAFTIN) 1 % cream Apply topically 2 (two) times daily as needed. 60 g 2  . rosuvastatin (CRESTOR) 5 MG tablet TAKE 1 TABLET BY MOUTH ONCE DAILY FOR CHOLESTEROL 90 tablet 0  . simethicone (MYLICON) 80 MG chewable tablet Chew 80 mg by mouth as needed for flatulence.     Marland Kitchen tiZANidine (ZANAFLEX) 4 MG tablet TAKE 1/2 (ONE-HALF) TABLET BY MOUTH EVERY 6 HOURS AS NEEDED FOR MUSCLE SPASM 30 tablet 0  . torsemide (DEMADEX) 20 MG tablet Take 10 mg by mouth daily as needed (swelling).      No facility-administered medications prior to visit.    Allergies  Allergen Reactions  . Arimidex [Anastrozole] Other (See Comments)    Arthralgias and myalgias.  Improved with 3 week hiatus from drug.  Categorical side effect of drug class.  . Other Hives and Itching  . Adhesive [Tape] Other (See Comments)    "red and blistered"  . Lisinopril     Dry hacking cough and tickle  . Tetanus  Toxoids   . Tetracyclines & Related Other (See Comments)    unknown    Review of Systems  Constitutional: Negative for chills and fever.  HENT: Positive for congestion, sinus pressure, sinus pain and sore throat. Negative for ear pain.   Respiratory: Positive for cough.   Cardiovascular: Negative.        Objective:    Physical Exam  Ht _0  (1.651 m)   Wt 222 lb 6.4 oz (100.9 kg)   BMI 37.01 kg/m  Wt Readings from Last 3 Encounters:  07/08/20 222 lb 6.4 oz (100.9 kg)  06/25/20 229 lb (103.9 kg)  04/09/20 227 lb (103 kg)    Health Maintenance Due  Topic Date Due  . URINE MICROALBUMIN  07/22/2020    There are no preventive care reminders to display for this patient.   Lab Results  Component Value Date   TSH 3.12 01/18/2020   Lab Results  Component Value Date   WBC 8.1 04/08/2020   HGB 13.1 04/08/2020   HCT 39.2 04/08/2020   MCV 83.2 04/08/2020   PLT 225 04/08/2020   Lab Results  Component Value Date   NA 143 04/08/2020   K 4.4 04/08/2020   CO2 28 04/08/2020   GLUCOSE 106 (H) 04/08/2020   BUN 18 04/08/2020   CREATININE 0.79 04/08/2020   BILITOT 0.4 04/08/2020   ALKPHOS 51 10/12/2019   AST 17 04/08/2020   ALT 12 04/08/2020  PROT 5.9 (L) 04/08/2020   ALBUMIN 3.9 10/12/2019   CALCIUM 9.5 04/08/2020   ANIONGAP 11 10/12/2019   Lab Results  Component Value Date   CHOL 128 04/08/2020   Lab Results  Component Value Date   HDL 47 (L) 04/08/2020   Lab Results  Component Value Date   LDLCALC 53 04/08/2020   Lab Results  Component Value Date   TRIG 228 (H) 04/08/2020   Lab Results  Component Value Date   CHOLHDL 2.7 04/08/2020   Lab Results  Component Value Date   HGBA1C 6.7 (H) 04/08/2020       Assessment & Plan:   Problem List Items Addressed This Visit      Respiratory   Sinusitis    -Rx. Coricidin -recommended home COVID test; she will call with results -she wil lcall back Thurs/Fri, and we will consider adding abx if she is  still symptomatic (?augmentin)      Relevant Medications   Chlorpheniramine-DM (CORICIDIN COUGH/COLD) 4-30 MG TABS       Meds ordered this encounter  Medications  . Chlorpheniramine-DM (CORICIDIN COUGH/COLD) 4-30 MG TABS    Sig: Take 1 tablet by mouth every 6 (six) hours as needed.    Dispense:  28 tablet    Refill:  0   Date:  07/08/2020   Location of Patient: Home Location of Provider: Office Consent was obtain for visit to be over via telehealth. I verified that I am speaking with the correct person using two identifiers.  I connected with  Julie Clay on 07/08/20 via telephone and verified that I am speaking with the correct person using two identifiers.   I discussed the limitations of evaluation and management by telemedicine. The patient expressed understanding and agreed to proceed.  Time spent: 10 minutes   Noreene Larsson, NP

## 2020-07-13 ENCOUNTER — Other Ambulatory Visit: Payer: Self-pay | Admitting: Family Medicine

## 2020-07-14 ENCOUNTER — Telehealth: Payer: Self-pay

## 2020-07-14 ENCOUNTER — Other Ambulatory Visit: Payer: Self-pay | Admitting: Nurse Practitioner

## 2020-07-14 MED ORDER — AMOXICILLIN-POT CLAVULANATE 875-125 MG PO TABS: 1.0000 | ORAL_TABLET | Freq: Two times a day (BID) | ORAL | 0 refills | Status: DC

## 2020-07-14 NOTE — Telephone Encounter (Signed)
Patient called said the med that she has been taken now for a week isn't helping now has a lot of flem and needs an antibiotic called in.  Chlorpheniramine-DM (CORICIDIN COUGH/COLD) 4-30 MG TABS  Pharmacy: [facility]

## 2020-07-14 NOTE — Telephone Encounter (Signed)
I sent in augmentin. I hope she feels better quickly.

## 2020-07-14 NOTE — Telephone Encounter (Signed)
Pt informed

## 2020-07-21 ENCOUNTER — Other Ambulatory Visit: Payer: Self-pay

## 2020-07-21 ENCOUNTER — Ambulatory Visit (HOSPITAL_COMMUNITY)
Admission: RE | Admit: 2020-07-21 | Discharge: 2020-07-21 | Disposition: A | Discharge status: Home / Self Care | Payer: Medicare Other | Source: Ambulatory Visit | Attending: Cardiology | Admitting: Cardiology

## 2020-07-21 DIAGNOSIS — R0602 Shortness of breath: Secondary | ICD-10-CM | POA: Diagnosis not present

## 2020-07-21 LAB — ECHOCARDIOGRAM COMPLETE
AR max vel: 2.78 cm2
AV Area VTI: 3.14 cm2
AV Area mean vel: 2.59 cm2
AV Mean grad: 4.7 mmHg
AV Peak grad: 8.2 mmHg
Ao pk vel: 1.43 m/s
Area-P 1/2: 2.32 cm2
S' Lateral: 2.5 cm

## 2020-07-21 NOTE — Progress Notes (Signed)
*  PRELIMINARY RESULTS* Echocardiogram 2D Echocardiogram has been performed.  Julie Clay 07/21/2020, 1:53 PM

## 2020-07-28 DIAGNOSIS — K219 Gastro-esophageal reflux disease without esophagitis: Secondary | ICD-10-CM | POA: Diagnosis not present

## 2020-07-28 DIAGNOSIS — K5909 Other constipation: Secondary | ICD-10-CM | POA: Diagnosis not present

## 2020-07-28 DIAGNOSIS — Z7689 Persons encountering health services in other specified circumstances: Secondary | ICD-10-CM | POA: Diagnosis not present

## 2020-07-28 DIAGNOSIS — I890 Lymphedema, not elsewhere classified: Secondary | ICD-10-CM | POA: Diagnosis not present

## 2020-07-28 DIAGNOSIS — E1143 Type 2 diabetes mellitus with diabetic autonomic (poly)neuropathy: Secondary | ICD-10-CM | POA: Diagnosis not present

## 2020-07-28 DIAGNOSIS — I441 Atrioventricular block, second degree: Secondary | ICD-10-CM | POA: Diagnosis not present

## 2020-07-28 DIAGNOSIS — K760 Fatty (change of) liver, not elsewhere classified: Secondary | ICD-10-CM | POA: Diagnosis not present

## 2020-07-29 NOTE — Progress Notes (Signed)
A1c is at goal. Triglycerides are a little elevated, but we can discuss this tomorrow.

## 2020-07-30 ENCOUNTER — Ambulatory Visit (INDEPENDENT_AMBULATORY_CARE_PROVIDER_SITE_OTHER): Payer: Medicare Other | Admitting: Nurse Practitioner

## 2020-07-30 ENCOUNTER — Encounter: Payer: Self-pay | Admitting: Nurse Practitioner

## 2020-07-30 ENCOUNTER — Other Ambulatory Visit: Payer: Self-pay

## 2020-07-30 DIAGNOSIS — E1141 Type 2 diabetes mellitus with diabetic mononeuropathy: Secondary | ICD-10-CM

## 2020-07-30 DIAGNOSIS — E1143 Type 2 diabetes mellitus with diabetic autonomic (poly)neuropathy: Secondary | ICD-10-CM

## 2020-07-30 LAB — LIPID PANEL WITH LDL/HDL RATIO
Cholesterol, Total: 146 mg/dL (ref 100–199)
HDL: 44 mg/dL (ref >39)
LDL Chol Calc (NIH): 62 mg/dL (ref 0–99)
LDL/HDL Ratio: 1.4 ratio (ref 0.0–3.2)
Triglycerides: 249 mg/dL — ABNORMAL HIGH (ref 0–149)
VLDL Cholesterol Cal: 40 mg/dL (ref 5–40)

## 2020-07-30 LAB — CBC WITH DIFFERENTIAL/PLATELET
Basophils Absolute: 0 10*3/uL (ref 0.0–0.2)
Basos: 0 %
EOS (ABSOLUTE): 0.3 10*3/uL (ref 0.0–0.4)
Eos: 3 %
Hematocrit: 43.2 % (ref 34.0–46.6)
Hemoglobin: 14.5 g/dL (ref 11.1–15.9)
Immature Grans (Abs): 0.1 10*3/uL (ref 0.0–0.1)
Immature Granulocytes: 1 %
Lymphocytes Absolute: 2.6 10*3/uL (ref 0.7–3.1)
Lymphs: 29 %
MCH: 27.9 pg (ref 26.6–33.0)
MCHC: 33.6 g/dL (ref 31.5–35.7)
MCV: 83 fL (ref 79–97)
Monocytes Absolute: 0.7 10*3/uL (ref 0.1–0.9)
Monocytes: 8 %
Neutrophils Absolute: 5.3 10*3/uL (ref 1.4–7.0)
Neutrophils: 59 %
Platelets: 270 10*3/uL (ref 150–450)
RBC: 5.2 x10E6/uL (ref 3.77–5.28)
RDW: 13.2 % (ref 11.7–15.4)
WBC: 8.9 10*3/uL (ref 3.4–10.8)

## 2020-07-30 LAB — CMP14+EGFR
ALT: 16 IU/L (ref 0–32)
AST: 17 IU/L (ref 0–40)
Albumin/Globulin Ratio: 1.6 (ref 1.2–2.2)
Albumin: 4.2 g/dL (ref 3.7–4.7)
Alkaline Phosphatase: 50 IU/L (ref 44–121)
BUN/Creatinine Ratio: 21 (ref 12–28)
BUN: 17 mg/dL (ref 8–27)
Bilirubin Total: 0.5 mg/dL (ref 0.0–1.2)
CO2: 21 mmol/L (ref 20–29)
Calcium: 9.7 mg/dL (ref 8.7–10.3)
Chloride: 105 mmol/L (ref 96–106)
Creatinine, Ser: 0.81 mg/dL (ref 0.57–1.00)
Globulin, Total: 2.6 g/dL (ref 1.5–4.5)
Glucose: 115 mg/dL — ABNORMAL HIGH (ref 65–99)
Potassium: 4.5 mmol/L (ref 3.5–5.2)
Sodium: 143 mmol/L (ref 134–144)
Total Protein: 6.8 g/dL (ref 6.0–8.5)
eGFR: 75 mL/min/{1.73_m2} (ref >59)

## 2020-07-30 LAB — HEMOGLOBIN A1C
Est. average glucose Bld gHb Est-mCnc: 137 mg/dL
Hgb A1c MFr Bld: 6.4 % — ABNORMAL HIGH (ref 4.8–5.6)

## 2020-07-30 LAB — MICROALBUMIN / CREATININE URINE RATIO
Creatinine, Urine: 74.3 mg/dL
Microalb/Creat Ratio: <4 mg/g creat (ref 0–29)
Microalbumin, Urine: <3 ug/mL

## 2020-07-30 MED ORDER — TIZANIDINE HCL 4 MG PO TABS: 4.0000 mg | ORAL_TABLET | Freq: Four times a day (QID) | ORAL | 1 refills | Status: DC | PRN

## 2020-07-30 MED ORDER — UNABLE TO FIND: 0 refills | Status: DC

## 2020-07-30 MED ORDER — ROSUVASTATIN CALCIUM 5 MG PO TABS: 5.0000 mg | ORAL_TABLET | Freq: Every day | ORAL | 1 refills | Status: DC

## 2020-07-30 NOTE — Assessment & Plan Note (Addendum)
-  she would like rx for DM shoes -neuropathy affects left great toe -foot exam today

## 2020-07-30 NOTE — Patient Instructions (Signed)
Please have fasting labs drawn 2-3 days prior to your appointment so we can discuss the results during your office visit.  

## 2020-07-30 NOTE — Assessment & Plan Note (Signed)
Lab Results  Component Value Date   HGBA1C 6.4 (H) 07/28/2020  -well controlled -on statin; no ACEi/ARB

## 2020-07-30 NOTE — Progress Notes (Signed)
Established Patient Office Visit  Subjective:  Patient ID: Julie Clay, female    DOB: 04-02-1941  Age: 80 y.o. MRN: [id number]  CC:  Chief Complaint  Patient presents with  . Follow-up    Go over labs     HPI Julie Clay presents for lab follow-up.  Past Medical History:  Diagnosis Date  . 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 Arthritis   . Asthma   . Bone spur   . Breast cancer, left breast ([place])    2012  . Breast cancer, right breast ([employer])    2021  . Chronic bronchitis ([place])   . COPD (chronic obstructive pulmonary disease) ([place])   . Diabetes mellitus without complication ([place])    Phreesia 04/08/2020  . Diastolic heart failure ([place])   . Essential hypertension   . Fatty liver 2013  . GERD (gastroesophageal reflux disease)   . HSV (herpes simplex virus) infection   . Hyperlipidemia   . Lung nodule seen on imaging study 2013  . Lymphedema    Left arm  . 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 Osteoarthritis    both knees, lower back, both shoulders; bone spurs to feet  . Rectocele 06/25/2013   Posterior repair 11/06/13   . Type II diabetes mellitus ([place])   . Ventral hernia     Past Surgical History:  Procedure Laterality Date  . BREAST BIOPSY Left 04/2011  . BREAST BIOPSY Left 06/22/13  . BREAST BIOPSY Left 09/12/2013   Procedure: BREAST BIOPSY WITH NEEDLE LOCALIZATION;  Surgeon: Jamesetta So, MD;  Location: [facility];  Service: General;  Laterality: Left;  . BREAST LUMPECTOMY Left 04/2011 X 2  . BREAST SURGERY N/A    Phreesia 01/17/2020  . CATARACT EXTRACTION W/ INTRAOCULAR LENS  IMPLANT, BILATERAL Bilateral   . CATARACT EXTRACTION, BILATERAL     on different occassions   . [place]  . COLON SURGERY N/A    2014  . DILATION AND CURETTAGE OF UTERUS  11/06/2013  . HAND LIGAMENT RECONSTRUCTION Right ~ 2010  . HYSTEROSCOPY WITH D & C N/A 11/06/2013   Procedure: DILATATION AND CURETTAGE /HYSTEROSCOPY;  Surgeon: Jonnie Kind, MD;  Location:  [facility];  Service: Gynecology;  Laterality: N/A;  . INSERT / REPLACE / REMOVE PACEMAKER  06/22/2017  . LUNG LOBECTOMY Right 1988   Fungal Infection  . PACEMAKER IMPLANT N/A 06/22/2017   Procedure: PACEMAKER IMPLANT;  Surgeon: Deboraha Sprang, MD;  Location: [facility];  Service: Cardiovascular;  Laterality: N/A;  . PARTIAL MASTECTOMY WITH NEEDLE LOCALIZATION Right 01/02/2020   Procedure: RIGHT PARTIAL MASTECTOMY AFTER NEEDLE LOCALIZATION;  Surgeon: Aviva Signs, MD;  Location: [facility];  Service: General;  Laterality: Right;  . POLYPECTOMY N/A 11/06/2013   Procedure: POLYPECTOMY (REMOVAL ENDOMETRIAL POLYP);  Surgeon: Jonnie Kind, MD;  Location: [facility];  Service: Gynecology;  Laterality: N/A;  . RECTOCELE REPAIR N/A 11/06/2013   Procedure: POSTERIOR REPAIR (RECTOCELE);  Surgeon: Jonnie Kind, MD;  Location: [facility];  Service: Gynecology;  Laterality: N/A;  . SHOULDER ARTHROSCOPY WITH OPEN ROTATOR CUFF REPAIR Left 07/26/2012  . TONSILLECTOMY  1949  . TUBAL LIGATION      Family History  Problem Relation Age of Onset  . Hypertension Mother   . Heart disease Mother   . Diabetes Mother   . Kidney disease Mother        ESRD  . Cancer  Mother        Bladder  . Hypertension Father   . Hyperlipidemia Father   . Heart disease Father   . Diabetes Father   . Cancer Father        Stomach Cancer  . Diabetes Sister   . Heart disease Sister   . Alzheimer's disease Paternal Aunt   . Stroke Maternal Grandfather   . Cancer Paternal Grandfather        stomach  . Cancer Maternal Uncle        prostate  . Cancer Paternal Uncle        stomach    Social History   Socioeconomic History  . Marital status: Married    Spouse name: Not on file  . Number of children: Not on file  . Years of education: Not on file  . Highest education level: Not on file  Occupational History  . 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 Smoking status: Never  Smoker  . Smokeless tobacco: Never Used  Vaping Use  . Vaping Use: Never used  Substance and Sexual Activity  . Alcohol use: Yes    Alcohol/week: 0.0 standard drinks    Comment: 06/22/2017 "might have a drink twice a year"  . Drug use: No  . Sexual activity: Not on file  Other Topics Concern  . Not on file  Social History Narrative  . Not on file   Social Determinants of Health   Financial Resource Strain: Low Risk   . Difficulty of Paying Living Expenses: Not very hard  Food Insecurity: Not on file  Transportation Needs: Not on file  Physical Activity: Not on file  Stress: Not on file  Social Connections: Not on file  Intimate Partner Violence: Not on file    Outpatient Medications Prior to Visit  Medication Sig Dispense Refill  . acetaminophen (TYLENOL) 500 MG tablet Take 1,000 mg by mouth every 6 (six) hours as needed for moderate pain.     Marland Kitchen albuterol (VENTOLIN HFA) 108 (90 Base) MCG/ACT inhaler Inhale 2 puffs into the lungs every 6 (six) hours as needed for wheezing or shortness of breath. 8 g 0  . Aloe Vera Leaf POWD Take 800 mg by mouth 2 (two) times daily.    Marland Kitchen amoxicillin-clavulanate (AUGMENTIN) 875-125 MG tablet Take 1 tablet by mouth 2 (two) times daily. 14 tablet 0  . Ascorbic Acid (VITAMIN C) 1000 MG tablet Take 1,000 mg by mouth 2 (two) times daily.    Marland Kitchen b complex vitamins tablet Take 1 tablet by mouth 2 (two) times daily.     . Blood Glucose Monitoring Suppl (BLOOD GLUCOSE SYSTEM PAK) KIT Use as directed to monitor FSBS 1x daily. Dx: E11.9. Please dispense as Accu-Chek Aviva 1 each 1  . Calcium Polycarbophil (FIBER-CAPS PO) Take by mouth every other day.    . Chlorpheniramine-DM (CORICIDIN COUGH/COLD) 4-30 MG TABS Take 1 tablet by mouth every 6 (six) hours as needed. 28 tablet 0  . Cholecalciferol (VITAMIN D3) 2000 UNITS TABS Take 2,000 Units by mouth 2 (two) times daily.    . Dulaglutide (TRULICITY) 6.16 WV/3.7TG SOPN Inject 0.75 mg into the skin once a week. 3 mL  5  . fluocinonide cream (LIDEX) 6.26 % Apply 1 application topically 2 (two) times daily as needed (poison ivy/bites/hives). 30 g 11  . Ginkgo Biloba 120 MG CAPS Take 120 mg by mouth in the morning and at bedtime.     Marland Kitchen  glipiZIDE (GLUCOTROL) 10 MG tablet TAKE 1/2 (ONE-HALF) TABLET BY MOUTH TWICE DAILY BEFORE A MEAL FOR DIABETES 180 tablet 0  . Glucose Blood (BLOOD GLUCOSE TEST STRIPS) STRP Use as directed to monitor FSBS 1x daily. Dx: E11.9. Please dispense as Accu-Chek Aviva 100 each 1  . Krill Oil 1000 MG CAPS Take 1,000 mg by mouth 2 (two) times daily.     . Lancets MISC Use as directed to monitor FSBS 1x daily. Dx: E11.9. Please dispense as Accu-Chek Aviva 100 each 1  . LORazepam (ATIVAN) 0.5 MG tablet Take 1 tablet (0.5 mg total) by mouth 2 (two) times daily as needed for anxiety. 30 tablet 1  . melatonin 5 MG TABS Take 5 mg by mouth at bedtime as needed (sleep).     . metFORMIN (GLUCOPHAGE) 1000 MG tablet TAKE 1 TABLET BY MOUTH TWICE DAILY WITH A MEAL FOR DIABETES 180 tablet 0  . metoprolol succinate (TOPROL-XL) 25 MG 24 hr tablet Take 0.5 tablets (12.5 mg total) by mouth daily. 45 tablet 3  . Multiple Vitamin (MULTIVITAMIN WITH MINERALS) TABS tablet Take 1 tablet by mouth daily.    . naftifine (NAFTIN) 1 % cream Apply topically 2 (two) times daily as needed. 60 g 2  . simethicone (MYLICON) 80 MG chewable tablet Chew 80 mg by mouth as needed for flatulence.     . torsemide (DEMADEX) 20 MG tablet Take 10 mg by mouth daily as needed (swelling).     . rosuvastatin (CRESTOR) 5 MG tablet TAKE 1 TABLET BY MOUTH ONCE DAILY FOR CHOLESTEROL 30 tablet 0  . tiZANidine (ZANAFLEX) 4 MG tablet TAKE 1/2 (ONE-HALF) TABLET BY MOUTH EVERY 6 HOURS AS NEEDED FOR MUSCLE SPASM 30 tablet 0   No facility-administered medications prior to visit.    Allergies  Allergen Reactions  . Arimidex [Anastrozole] Other (See Comments)    Arthralgias and myalgias.  Improved with 3 week hiatus from drug.  Categorical side  effect of drug class.  . Other Hives and Itching  . Adhesive [Tape] Other (See Comments)    "red and blistered"  . Lisinopril     Dry hacking cough and tickle  . Tetanus Toxoids   . Tetracyclines & Related Other (See Comments)    unknown    ROS Review of Systems  Constitutional: Negative.   Respiratory: Negative.   Cardiovascular: Negative.   Neurological: Positive for numbness.       -to left great toe  Psychiatric/Behavioral: Negative.       Objective:    Physical Exam Constitutional:      Appearance: Normal appearance.  Cardiovascular:     Rate and Rhythm: Normal rate and regular rhythm.     Pulses: Normal pulses.          Dorsalis pedis pulses are 2+ on the right side and 2+ on the left side.     Heart sounds: Normal heart sounds.  Pulmonary:     Effort: Pulmonary effort is normal.     Breath sounds: Normal breath sounds.  Musculoskeletal:        General: Normal range of motion.       Feet:  Feet:     Right foot:     Protective Sensation: 10 sites tested. 10 sites sensed.     Skin integrity: Skin integrity normal.     Toenail Condition: Right toenails are normal.     Left foot:     Protective Sensation: 10 sites tested. 9 sites sensed.  Skin integrity: Skin integrity normal.     Toenail Condition: Left toenails are normal.  Neurological:     Mental Status: She is alert.     Sensory: Sensory deficit present.     Comments: To left great toe     BP 128/81   Pulse 92   Temp (!) 97.4 F (36.3 C)   Resp 18   Ht 5\' 5"  (1.651 m)   Wt 222 lb (100.7 kg)   SpO2 96%   BMI 36.94 kg/m  Wt Readings from Last 3 Encounters:  07/30/20 222 lb (100.7 kg)  07/08/20 222 lb 6.4 oz (100.9 kg)  06/25/20 229 lb (103.9 kg)     There are no preventive care reminders to display for this patient.  There are no preventive care reminders to display for this patient.  Lab Results  Component Value Date   TSH 3.12 01/18/2020   Lab Results  Component Value Date    WBC 8.9 07/28/2020   HGB 14.5 07/28/2020   HCT 43.2 07/28/2020   MCV 83 07/28/2020   PLT 270 07/28/2020   Lab Results  Component Value Date   NA 143 07/28/2020   K 4.5 07/28/2020   CO2 21 07/28/2020   GLUCOSE 115 (H) 07/28/2020   BUN 17 07/28/2020   CREATININE 0.81 07/28/2020   BILITOT 0.5 07/28/2020   ALKPHOS 50 07/28/2020   AST 17 07/28/2020   ALT 16 07/28/2020   PROT 6.8 07/28/2020   ALBUMIN 4.2 07/28/2020   CALCIUM 9.7 07/28/2020   ANIONGAP 11 10/12/2019   Lab Results  Component Value Date   CHOL 146 07/28/2020   Lab Results  Component Value Date   HDL 44 07/28/2020   Lab Results  Component Value Date   LDLCALC 62 07/28/2020   Lab Results  Component Value Date   TRIG 249 (H) 07/28/2020   Lab Results  Component Value Date   CHOLHDL 2.7 04/08/2020   Lab Results  Component Value Date   HGBA1C 6.4 (H) 07/28/2020      Assessment & Plan:   Problem List Items Addressed This Visit      Endocrine   DM (diabetes mellitus) (HCC)    Lab Results  Component Value Date   HGBA1C 6.4 (H) 07/28/2020  -well controlled -on statin; no ACEi/ARB      Relevant Medications   rosuvastatin (CRESTOR) 5 MG tablet   Other Relevant Orders   CBC with Differential/Platelet   CMP14+EGFR   Lipid Panel With LDL/HDL Ratio   Microalbumin / creatinine urine ratio   Hemoglobin A1c   Diabetic neuropathy (HCC)    -she would like rx for DM shoes -neuropathy affects left great toe -foot exam today      Relevant Medications   rosuvastatin (CRESTOR) 5 MG tablet      Meds ordered this encounter  Medications  . rosuvastatin (CRESTOR) 5 MG tablet    Sig: Take 1 tablet (5 mg total) by mouth daily. for cholesterol.    Dispense:  90 tablet    Refill:  1    Pt will need to establish care with new primary care provider for future refills  . tiZANidine (ZANAFLEX) 4 MG tablet    Sig: Take 1 tablet (4 mg total) by mouth every 6 (six) hours as needed for muscle spasms.     Dispense:  180 tablet    Refill:  1    Follow-up: Return in about 6 months (around 01/29/2021) for Lab follow-up.  Noreene Larsson, NP

## 2020-07-30 NOTE — Addendum Note (Signed)
Addended by: Lonn Georgia on: 07/30/2020 03:11 PM   Modules accepted: Orders

## 2020-08-15 DIAGNOSIS — M25561 Pain in right knee: Secondary | ICD-10-CM | POA: Diagnosis not present

## 2020-09-02 DIAGNOSIS — K219 Gastro-esophageal reflux disease without esophagitis: Secondary | ICD-10-CM | POA: Diagnosis not present

## 2020-09-02 DIAGNOSIS — R198 Other specified symptoms and signs involving the digestive system and abdomen: Secondary | ICD-10-CM | POA: Diagnosis not present

## 2020-09-02 DIAGNOSIS — Z8719 Personal history of other diseases of the digestive system: Secondary | ICD-10-CM | POA: Diagnosis not present

## 2020-09-19 ENCOUNTER — Other Ambulatory Visit: Payer: Self-pay | Admitting: Family Medicine

## 2020-09-19 NOTE — Telephone Encounter (Signed)
Patient has transferred care to your office.  

## 2020-09-23 ENCOUNTER — Ambulatory Visit (INDEPENDENT_AMBULATORY_CARE_PROVIDER_SITE_OTHER): Payer: Medicare Other

## 2020-09-23 DIAGNOSIS — I441 Atrioventricular block, second degree: Secondary | ICD-10-CM

## 2020-09-23 LAB — CUP PACEART REMOTE DEVICE CHECK
Battery Remaining Longevity: 103 mo
Battery Remaining Percentage: 95.5 %
Battery Voltage: 2.99 V
Brady Statistic AP VP Percent: 2.6 %
Brady Statistic AP VS Percent: 1 %
Brady Statistic AS VP Percent: 97 %
Brady Statistic AS VS Percent: 1 %
Brady Statistic RA Percent Paced: 2.5 %
Brady Statistic RV Percent Paced: 99 %
Date Time Interrogation Session: 20220607020012
Implantable Lead Implant Date: 20190306
Implantable Lead Implant Date: 20190306
Implantable Lead Location: 753859
Implantable Lead Location: 753860
Implantable Lead Model: 5076
Implantable Lead Model: 5076
Implantable Pulse Generator Implant Date: 20190306
Lead Channel Impedance Value: 350 Ohm
Lead Channel Impedance Value: 450 Ohm
Lead Channel Pacing Threshold Amplitude: 0.5 V
Lead Channel Pacing Threshold Amplitude: 0.75 V
Lead Channel Pacing Threshold Pulse Width: 0.5 ms
Lead Channel Pacing Threshold Pulse Width: 0.5 ms
Lead Channel Sensing Intrinsic Amplitude: 3.7 mV
Lead Channel Sensing Intrinsic Amplitude: 5.6 mV
Lead Channel Setting Pacing Amplitude: 2 V
Lead Channel Setting Pacing Amplitude: 2.5 V
Lead Channel Setting Pacing Pulse Width: 0.5 ms
Lead Channel Setting Sensing Sensitivity: 2 mV
Pulse Gen Model: 2272
Pulse Gen Serial Number: 9001027

## 2020-09-29 ENCOUNTER — Telehealth: Payer: Self-pay

## 2020-09-29 ENCOUNTER — Other Ambulatory Visit: Payer: Self-pay

## 2020-09-29 DIAGNOSIS — E1143 Type 2 diabetes mellitus with diabetic autonomic (poly)neuropathy: Secondary | ICD-10-CM

## 2020-09-29 DIAGNOSIS — E1141 Type 2 diabetes mellitus with diabetic mononeuropathy: Secondary | ICD-10-CM

## 2020-09-29 MED ORDER — UNABLE TO FIND: 0 refills | Status: DC

## 2020-09-29 NOTE — Telephone Encounter (Signed)
Rx and OV notes faxed to [facility].

## 2020-09-29 NOTE — Telephone Encounter (Signed)
Patient called said that medicare at [facility] denied the prescription for orthotics and ask to fax to [place] in [place]. [place] [address]. Phone [phone] Fax [phone]  Patient call back # [phone]

## 2020-09-30 ENCOUNTER — Other Ambulatory Visit: Payer: Self-pay

## 2020-09-30 DIAGNOSIS — E1143 Type 2 diabetes mellitus with diabetic autonomic (poly)neuropathy: Secondary | ICD-10-CM

## 2020-09-30 DIAGNOSIS — E1141 Type 2 diabetes mellitus with diabetic mononeuropathy: Secondary | ICD-10-CM

## 2020-09-30 MED ORDER — UNABLE TO FIND: 0 refills | Status: DC

## 2020-09-30 NOTE — Telephone Encounter (Signed)
New rx sent

## 2020-09-30 NOTE — Telephone Encounter (Signed)
Patient said prescription was denied the way it sent in.   Need a new prescription to say: Custom molded foot orthotics and heel spurs.  DO NOT add the diabetic to this prescription medicare will deny. She understand she will have to pay out of pocket.  Patient call back # [phone]

## 2020-10-02 ENCOUNTER — Other Ambulatory Visit (HOSPITAL_COMMUNITY): Payer: Self-pay | Admitting: Hematology

## 2020-10-02 DIAGNOSIS — R928 Other abnormal and inconclusive findings on diagnostic imaging of breast: Secondary | ICD-10-CM

## 2020-10-02 DIAGNOSIS — Z9889 Other specified postprocedural states: Secondary | ICD-10-CM

## 2020-10-13 ENCOUNTER — Telehealth: Payer: Self-pay

## 2020-10-13 ENCOUNTER — Other Ambulatory Visit: Payer: Self-pay

## 2020-10-13 MED ORDER — GLIPIZIDE 10 MG PO TABS: ORAL_TABLET | ORAL | 0 refills | Status: DC

## 2020-10-13 NOTE — Telephone Encounter (Signed)
Rx sent. Pt informed.  

## 2020-10-13 NOTE — Telephone Encounter (Signed)
Please send to [facility] [address]   Glipizide 10mg   90 days    Please call pt when this is completed

## 2020-10-15 NOTE — Progress Notes (Signed)
Remote pacemaker transmission.   

## 2020-10-16 ENCOUNTER — Encounter: Payer: Self-pay | Admitting: Cardiology

## 2020-10-16 ENCOUNTER — Ambulatory Visit (INDEPENDENT_AMBULATORY_CARE_PROVIDER_SITE_OTHER): Payer: Medicare Other | Admitting: Cardiology

## 2020-10-16 ENCOUNTER — Other Ambulatory Visit: Payer: Self-pay

## 2020-10-16 VITALS — BP 110/76 | HR 88 | Ht 65.0 in | Wt 228.0 lb

## 2020-10-16 DIAGNOSIS — E782 Mixed hyperlipidemia: Secondary | ICD-10-CM | POA: Diagnosis not present

## 2020-10-16 DIAGNOSIS — I5032 Chronic diastolic (congestive) heart failure: Secondary | ICD-10-CM | POA: Diagnosis not present

## 2020-10-16 DIAGNOSIS — Z950 Presence of cardiac pacemaker: Secondary | ICD-10-CM | POA: Diagnosis not present

## 2020-10-16 NOTE — Patient Instructions (Signed)

## 2020-10-16 NOTE — Progress Notes (Signed)
Cardiology Office Note  Date: 10/16/2020   ID: Raisa, Ditto [birth year], MRN [id number]  PCP:  Noreene Larsson, NP  Cardiologist:  Rozann Lesches, MD Electrophysiologist:  [name], MD   Chief Complaint  Patient presents with   Cardiac follow-up    History of Present Illness: Julie Clay is a 80 y.o. female last seen in October 2021.  She is here for a routine visit.  Reports stable, multifactorial dyspnea on exertion, no obvious angina symptoms.  She has a 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 Caryl Comes.  Most recent device interrogation indicated normal function.  She reports no sudden dizziness or syncope.  Follow-up echocardiogram in April revealed LVEF 55 to 60% with mild diastolic dysfunction.  I personally reviewed her ECG today which shows a ventricular paced rhythm with atrial sensing.  I reviewed her medications which are noted below.  She is tolerating low-dose Crestor and her last LDL was 62.  Past Medical History:  Diagnosis Date   2nd degree AV block 06/22/2017   Arthritis    Asthma    Bone spur    Breast cancer, left breast ([place])    2012   Breast cancer, right breast ([place])    2021   Chronic bronchitis (HCC)    COPD (chronic obstructive pulmonary disease) ([place])    Diabetes mellitus without complication ([place])    Phreesia 97/41/6384   Diastolic heart failure ([place])    Essential hypertension    Fatty liver 2013   GERD (gastroesophageal reflux disease)    HSV (herpes simplex virus) infection    Hyperlipidemia    Lung nodule seen on imaging study 2013   Lymphedema    Left arm   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    Osteoarthritis    both knees, lower back, both shoulders; bone spurs to feet   Rectocele 06/25/2013   Posterior repair 11/06/13    Type II diabetes mellitus ([place])    Ventral hernia     Past Surgical History:  Procedure Laterality Date   BREAST BIOPSY Left 04/2011   BREAST BIOPSY Left 06/22/13   BREAST BIOPSY  Left 09/12/2013   Procedure: BREAST BIOPSY WITH NEEDLE LOCALIZATION;  Surgeon: Jamesetta So, MD;  Location: [facility];  Service: General;  Laterality: Left;   BREAST LUMPECTOMY Left 04/2011 X 2   BREAST SURGERY N/A    Phreesia 01/17/2020   CATARACT EXTRACTION W/ INTRAOCULAR LENS  IMPLANT, BILATERAL Bilateral    CATARACT EXTRACTION, BILATERAL     on different occassions    [place]   COLON SURGERY N/A    2014   DILATION AND CURETTAGE OF UTERUS  11/06/2013   HAND LIGAMENT RECONSTRUCTION Right ~ 2010   HYSTEROSCOPY WITH D & C N/A 11/06/2013   Procedure: DILATATION AND CURETTAGE /HYSTEROSCOPY;  Surgeon: Jonnie Kind, MD;  Location: [facility];  Service: Gynecology;  Laterality: N/A;   INSERT / REPLACE / REMOVE PACEMAKER  06/22/2017   LUNG LOBECTOMY Right 1988   Fungal Infection   PACEMAKER IMPLANT N/A 06/22/2017   Procedure: PACEMAKER IMPLANT;  Surgeon: Deboraha Sprang, MD;  Location: [facility];  Service: Cardiovascular;  Laterality: N/A;   PARTIAL MASTECTOMY WITH NEEDLE LOCALIZATION Right 01/02/2020   Procedure: RIGHT PARTIAL MASTECTOMY AFTER NEEDLE LOCALIZATION;  Surgeon: Aviva Signs, MD;  Location: [facility];  Service: General;  Laterality: Right;   POLYPECTOMY N/A 11/06/2013   Procedure:  POLYPECTOMY (REMOVAL ENDOMETRIAL POLYP);  Surgeon: Jonnie Kind, MD;  Location: [facility];  Service: Gynecology;  Laterality: N/A;   RECTOCELE REPAIR N/A 11/06/2013   Procedure: POSTERIOR REPAIR (RECTOCELE);  Surgeon: Jonnie Kind, MD;  Location: [facility];  Service: Gynecology;  Laterality: N/A;   SHOULDER ARTHROSCOPY WITH OPEN ROTATOR CUFF REPAIR Left 07/26/2012   TONSILLECTOMY  1949   TUBAL LIGATION      Current Outpatient Medications  Medication Sig Dispense Refill   acetaminophen (TYLENOL) 500 MG tablet Take 1,000 mg by mouth every 6 (six) hours as needed for moderate pain.      albuterol (VENTOLIN HFA) 108 (90 Base) MCG/ACT inhaler Inhale 2 puffs into the lungs every 6 (six) hours as  needed for wheezing or shortness of breath. 8 g 0   Aloe Vera Leaf POWD Take 800 mg by mouth 2 (two) times daily.     Ascorbic Acid (VITAMIN C) 1000 MG tablet Take 1,000 mg by mouth 2 (two) times daily.     b complex vitamins tablet Take 1 tablet by mouth 2 (two) times daily.      Blood Glucose Monitoring Suppl (BLOOD GLUCOSE SYSTEM PAK) KIT Use as directed to monitor FSBS 1x daily. Dx: E11.9. Please dispense as Accu-Chek Aviva 1 each 1   Calcium Polycarbophil (FIBER-CAPS PO) Take by mouth every other day.     Chlorpheniramine-DM (CORICIDIN COUGH/COLD) 4-30 MG TABS Take 1 tablet by mouth every 6 (six) hours as needed. 28 tablet 0   Cholecalciferol (VITAMIN D3) 2000 UNITS TABS Take 2,000 Units by mouth 2 (two) times daily.     Dulaglutide (TRULICITY) 1.76 HY/0.7PX SOPN Inject 0.75 mg into the skin once a week. 3 mL 5   fluocinonide cream (LIDEX) 1.06 % Apply 1 application topically 2 (two) times daily as needed (poison ivy/bites/hives). 30 g 11   Ginkgo Biloba 120 MG CAPS Take 120 mg by mouth in the morning and at bedtime.      glipiZIDE (GLUCOTROL) 10 MG tablet TAKE 1/2 (ONE-HALF) TABLET BY MOUTH TWICE DAILY BEFORE A MEAL FOR DIABETES 180 tablet 0   Glucose Blood (BLOOD GLUCOSE TEST STRIPS) STRP Use as directed to monitor FSBS 1x daily. Dx: E11.9. Please dispense as Accu-Chek Aviva 100 each 1   Krill Oil 1000 MG CAPS Take 1,000 mg by mouth 2 (two) times daily.      Lancets MISC Use as directed to monitor FSBS 1x daily. Dx: E11.9. Please dispense as Accu-Chek Aviva 100 each 1   LORazepam (ATIVAN) 0.5 MG tablet Take 1 tablet (0.5 mg total) by mouth 2 (two) times daily as needed for anxiety. 30 tablet 1   melatonin 5 MG TABS Take 5 mg by mouth at bedtime as needed (sleep).      metFORMIN (GLUCOPHAGE) 1000 MG tablet TAKE 1 TABLET BY MOUTH TWICE DAILY WITH A MEAL FOR DIABETES 180 tablet 1   metoprolol succinate (TOPROL-XL) 25 MG 24 hr tablet Take 0.5 tablets (12.5 mg total) by mouth daily. 45 tablet 3    Multiple Vitamin (MULTIVITAMIN WITH MINERALS) TABS tablet Take 1 tablet by mouth daily.     naftifine (NAFTIN) 1 % cream Apply topically 2 (two) times daily as needed. 60 g 2   omeprazole (PRILOSEC) 40 MG capsule Take 40 mg by mouth every morning.     rosuvastatin (CRESTOR) 5 MG tablet Take 1 tablet (5 mg total) by mouth daily. for cholesterol. 90 tablet 1   simethicone (MYLICON) 80 MG chewable tablet Chew  80 mg by mouth as needed for flatulence.      tiZANidine (ZANAFLEX) 4 MG tablet Take 1 tablet (4 mg total) by mouth every 6 (six) hours as needed for muscle spasms. 180 tablet 1   torsemide (DEMADEX) 20 MG tablet Take 10 mg by mouth daily as needed (swelling).      UNABLE TO FIND Please dispense custom molded foot orthotics and heel spurs Dx: E11.30 1 each 0   No current facility-administered medications for this visit.   Allergies:  Arimidex [anastrozole], Other, Adhesive [tape], Lisinopril, Tetanus toxoids, and Tetracyclines & related   ROS: No syncope.  Physical Exam: VS:  BP 110/76   Pulse 88   Ht $R'5\' 5"'mp$  (1.651 m)   Wt 228 lb (103.4 kg)   SpO2 99%   BMI 37.94 kg/m , BMI Body mass index is 37.94 kg/m.  Wt Readings from Last 3 Encounters:  10/16/20 228 lb (103.4 kg)  07/30/20 222 lb (100.7 kg)  07/08/20 222 lb 6.4 oz (100.9 kg)    General: Patient appears comfortable at rest. HEENT: Conjunctiva and lids normal, wearing a mask. Neck: Supple, no elevated JVP or carotid bruits, no thyromegaly. Lungs: Clear to auscultation, nonlabored breathing at rest. Cardiac: Regular rate and rhythm, no S3 or significant systolic murmur, no pericardial rub. Extremities: No pitting edema.  ECG:  An ECG dated 07/31/2019 was personally reviewed today and demonstrated:  Ventricular pacing with atrial sensing.  Recent Labwork: 01/18/2020: Magnesium 1.8; TSH 3.12 07/28/2020: ALT 16; AST 17; BUN 17; Creatinine, Ser 0.81; Hemoglobin 14.5; Platelets 270; Potassium 4.5; Sodium 143     Component  Value Date/Time   CHOL 146 07/28/2020 1022   TRIG 249 (H) 07/28/2020 1022   HDL 44 07/28/2020 1022   CHOLHDL 2.7 04/08/2020 0925   VLDL 43 (H) 09/20/2016 1015   LDLCALC 62 07/28/2020 1022   LDLCALC 53 04/08/2020 0925   LDLDIRECT 102 (H) 12/09/2011 1035    Other Studies Reviewed Today:  Echocardiogram 07/21/2020:  1. Left ventricular ejection fraction, by estimation, is 55 to 60%. The  left ventricle has normal function. The left ventricle has no regional  wall motion abnormalities. There is mild left ventricular hypertrophy.  Left ventricular diastolic parameters  are consistent with Grade I diastolic dysfunction (impaired relaxation).   2. Right ventricular systolic function is normal. The right ventricular  size is normal.   3. Left atrial size was mildly dilated.   4. The mitral valve is normal in structure. No evidence of mitral valve  regurgitation. No evidence of mitral stenosis. Moderate mitral annular  calcification.   5. The aortic valve is tricuspid. There is mild calcification of the  aortic valve. There is mild thickening of the aortic valve. Aortic valve  regurgitation is not visualized.   6. The inferior vena cava is normal in size with greater than 50%  respiratory variability, suggesting right atrial pressure of 3 mmHg.   Assessment and Plan:  1.  Chronic diastolic heart failure, continues on low-dose Demadex with no persistent weight change.  Follow-up echocardiogram in April showed stable LVEF at 55 to 60% range with mild diastolic dysfunction and normal RV contraction.  Continue Toprol-XL and Demadex.  2.  History of symptomatic second-degree, type II heart block status post St. Jude pacemaker with follow-up by Dr. Caryl Comes.  3.  Mixed hyperlipidemia, LDL 62 on low-dose Crestor.  Medication Adjustments/Labs and Tests Ordered: Current medicines are reviewed at length with the patient today.  Concerns regarding medicines are  outlined above.   Tests  Ordered: Orders Placed This Encounter  Procedures   EKG 12-Lead    Medication Changes: No orders of the defined types were placed in this encounter.   Disposition:  Follow up  6 months.  Signed, Satira Sark, MD, [facility] 10/16/2020 1:40 PM    [facility] [address], [place], [place] [address] Phone: [phone]; Fax: [phone]

## 2020-10-22 ENCOUNTER — Encounter (HOSPITAL_COMMUNITY): Payer: Self-pay

## 2020-10-22 ENCOUNTER — Inpatient Hospital Stay (HOSPITAL_COMMUNITY): Payer: Medicare Other | Attending: Hematology

## 2020-10-22 ENCOUNTER — Ambulatory Visit (HOSPITAL_COMMUNITY)
Admission: RE | Admit: 2020-10-22 | Discharge: 2020-10-22 | Disposition: A | Discharge status: Home / Self Care | Payer: Medicare Other | Source: Ambulatory Visit | Attending: Hematology | Admitting: Hematology

## 2020-10-22 ENCOUNTER — Other Ambulatory Visit: Payer: Self-pay

## 2020-10-22 ENCOUNTER — Ambulatory Visit (HOSPITAL_COMMUNITY): Payer: Medicare Other

## 2020-10-22 DIAGNOSIS — R197 Diarrhea, unspecified: Secondary | ICD-10-CM | POA: Insufficient documentation

## 2020-10-22 DIAGNOSIS — R928 Other abnormal and inconclusive findings on diagnostic imaging of breast: Secondary | ICD-10-CM | POA: Diagnosis not present

## 2020-10-22 DIAGNOSIS — C50911 Malignant neoplasm of unspecified site of right female breast: Secondary | ICD-10-CM

## 2020-10-22 DIAGNOSIS — M1990 Unspecified osteoarthritis, unspecified site: Secondary | ICD-10-CM | POA: Insufficient documentation

## 2020-10-22 DIAGNOSIS — Z8052 Family history of malignant neoplasm of bladder: Secondary | ICD-10-CM | POA: Insufficient documentation

## 2020-10-22 DIAGNOSIS — Z887 Allergy status to serum and vaccine status: Secondary | ICD-10-CM | POA: Insufficient documentation

## 2020-10-22 DIAGNOSIS — Z9889 Other specified postprocedural states: Secondary | ICD-10-CM

## 2020-10-22 DIAGNOSIS — C50511 Malignant neoplasm of lower-outer quadrant of right female breast: Secondary | ICD-10-CM | POA: Diagnosis not present

## 2020-10-22 DIAGNOSIS — J449 Chronic obstructive pulmonary disease, unspecified: Secondary | ICD-10-CM | POA: Insufficient documentation

## 2020-10-22 DIAGNOSIS — Z823 Family history of stroke: Secondary | ICD-10-CM | POA: Insufficient documentation

## 2020-10-22 DIAGNOSIS — I890 Lymphedema, not elsewhere classified: Secondary | ICD-10-CM | POA: Insufficient documentation

## 2020-10-22 DIAGNOSIS — R200 Anesthesia of skin: Secondary | ICD-10-CM | POA: Insufficient documentation

## 2020-10-22 DIAGNOSIS — Z79899 Other long term (current) drug therapy: Secondary | ICD-10-CM | POA: Diagnosis not present

## 2020-10-22 DIAGNOSIS — Z170 Estrogen receptor positive status [ER+]: Secondary | ICD-10-CM | POA: Insufficient documentation

## 2020-10-22 DIAGNOSIS — K760 Fatty (change of) liver, not elsewhere classified: Secondary | ICD-10-CM | POA: Insufficient documentation

## 2020-10-22 DIAGNOSIS — R5383 Other fatigue: Secondary | ICD-10-CM | POA: Diagnosis not present

## 2020-10-22 DIAGNOSIS — Z841 Family history of disorders of kidney and ureter: Secondary | ICD-10-CM | POA: Insufficient documentation

## 2020-10-22 DIAGNOSIS — E119 Type 2 diabetes mellitus without complications: Secondary | ICD-10-CM | POA: Insufficient documentation

## 2020-10-22 DIAGNOSIS — Z8249 Family history of ischemic heart disease and other diseases of the circulatory system: Secondary | ICD-10-CM | POA: Insufficient documentation

## 2020-10-22 DIAGNOSIS — K5900 Constipation, unspecified: Secondary | ICD-10-CM | POA: Diagnosis not present

## 2020-10-22 DIAGNOSIS — E669 Obesity, unspecified: Secondary | ICD-10-CM | POA: Diagnosis not present

## 2020-10-22 DIAGNOSIS — E785 Hyperlipidemia, unspecified: Secondary | ICD-10-CM | POA: Diagnosis not present

## 2020-10-22 DIAGNOSIS — C50912 Malignant neoplasm of unspecified site of left female breast: Secondary | ICD-10-CM

## 2020-10-22 DIAGNOSIS — Z818 Family history of other mental and behavioral disorders: Secondary | ICD-10-CM | POA: Insufficient documentation

## 2020-10-22 DIAGNOSIS — I10 Essential (primary) hypertension: Secondary | ICD-10-CM | POA: Diagnosis not present

## 2020-10-22 DIAGNOSIS — Z853 Personal history of malignant neoplasm of breast: Secondary | ICD-10-CM | POA: Insufficient documentation

## 2020-10-22 DIAGNOSIS — N6092 Unspecified benign mammary dysplasia of left breast: Secondary | ICD-10-CM | POA: Diagnosis not present

## 2020-10-22 DIAGNOSIS — R350 Frequency of micturition: Secondary | ICD-10-CM | POA: Diagnosis not present

## 2020-10-22 DIAGNOSIS — M791 Myalgia, unspecified site: Secondary | ICD-10-CM | POA: Diagnosis not present

## 2020-10-22 DIAGNOSIS — R0602 Shortness of breath: Secondary | ICD-10-CM | POA: Diagnosis not present

## 2020-10-22 DIAGNOSIS — Z7984 Long term (current) use of oral hypoglycemic drugs: Secondary | ICD-10-CM | POA: Insufficient documentation

## 2020-10-22 DIAGNOSIS — R922 Inconclusive mammogram: Secondary | ICD-10-CM | POA: Diagnosis not present

## 2020-10-22 DIAGNOSIS — Z888 Allergy status to other drugs, medicaments and biological substances status: Secondary | ICD-10-CM | POA: Insufficient documentation

## 2020-10-22 DIAGNOSIS — Z800 Family history of malignant neoplasm of digestive organs: Secondary | ICD-10-CM | POA: Insufficient documentation

## 2020-10-22 DIAGNOSIS — Z8349 Family history of other endocrine, nutritional and metabolic diseases: Secondary | ICD-10-CM | POA: Insufficient documentation

## 2020-10-22 DIAGNOSIS — Z833 Family history of diabetes mellitus: Secondary | ICD-10-CM | POA: Insufficient documentation

## 2020-10-22 DIAGNOSIS — Z8042 Family history of malignant neoplasm of prostate: Secondary | ICD-10-CM | POA: Insufficient documentation

## 2020-10-22 LAB — CBC WITH DIFFERENTIAL/PLATELET
Abs Immature Granulocytes: 0.1 10*3/uL — ABNORMAL HIGH (ref 0.00–0.07)
Basophils Absolute: 0 10*3/uL (ref 0.0–0.1)
Basophils Relative: 0 %
Eosinophils Absolute: 0.2 10*3/uL (ref 0.0–0.5)
Eosinophils Relative: 2 %
HCT: 45.3 % (ref 36.0–46.0)
Hemoglobin: 14.8 g/dL (ref 12.0–15.0)
Immature Granulocytes: 1 %
Lymphocytes Relative: 32 %
Lymphs Abs: 3.5 10*3/uL (ref 0.7–4.0)
MCH: 27.7 pg (ref 26.0–34.0)
MCHC: 32.7 g/dL (ref 30.0–36.0)
MCV: 84.8 fL (ref 80.0–100.0)
Monocytes Absolute: 1 10*3/uL (ref 0.1–1.0)
Monocytes Relative: 9 %
Neutro Abs: 6.1 10*3/uL (ref 1.7–7.7)
Neutrophils Relative %: 56 %
Platelets: 302 10*3/uL (ref 150–400)
RBC: 5.34 MIL/uL — ABNORMAL HIGH (ref 3.87–5.11)
RDW: 13.3 % (ref 11.5–15.5)
WBC: 11 10*3/uL — ABNORMAL HIGH (ref 4.0–10.5)
nRBC: 0 % (ref 0.0–0.2)

## 2020-10-22 LAB — COMPREHENSIVE METABOLIC PANEL
ALT: 23 U/L (ref 0–44)
AST: 27 U/L (ref 15–41)
Albumin: 4.1 g/dL (ref 3.5–5.0)
Alkaline Phosphatase: 53 U/L (ref 38–126)
Anion gap: 11 (ref 5–15)
BUN: 19 mg/dL (ref 8–23)
CO2: 24 mmol/L (ref 22–32)
Calcium: 9.6 mg/dL (ref 8.9–10.3)
Chloride: 105 mmol/L (ref 98–111)
Creatinine, Ser: 0.8 mg/dL (ref 0.44–1.00)
GFR, Estimated: >60 mL/min (ref >60)
Glucose, Bld: 76 mg/dL (ref 70–99)
Potassium: 4 mmol/L (ref 3.5–5.1)
Sodium: 140 mmol/L (ref 135–145)
Total Bilirubin: 0.7 mg/dL (ref 0.3–1.2)
Total Protein: 7.7 g/dL (ref 6.5–8.1)

## 2020-10-29 ENCOUNTER — Ambulatory Visit (HOSPITAL_COMMUNITY): Payer: Medicare Other | Admitting: Hematology and Oncology

## 2020-11-06 ENCOUNTER — Encounter: Payer: Self-pay | Admitting: Internal Medicine

## 2020-11-06 ENCOUNTER — Ambulatory Visit (INDEPENDENT_AMBULATORY_CARE_PROVIDER_SITE_OTHER): Payer: Medicare Other | Admitting: Internal Medicine

## 2020-11-06 ENCOUNTER — Other Ambulatory Visit: Payer: Self-pay

## 2020-11-06 VITALS — BP 134/76 | HR 84 | Ht 65.0 in | Wt 228.0 lb

## 2020-11-06 DIAGNOSIS — I441 Atrioventricular block, second degree: Secondary | ICD-10-CM

## 2020-11-06 DIAGNOSIS — Z950 Presence of cardiac pacemaker: Secondary | ICD-10-CM

## 2020-11-06 NOTE — Progress Notes (Signed)
Patient Care Team: Noreene Larsson, NP as PCP - General (Nurse Practitioner) Deboraha Sprang, MD as PCP - Electrophysiology (Cardiology) Satira Sark, MD as PCP - Cardiology (Cardiology) Edythe Clarity, [facility] as Pharmacist (Pharmacist)   HPI  Julie Clay is a 80 y.o. female Seen in follow-up for a pacemaker implanted 3/19 for second-degree heart block and symptomatic bradycardia.  DATE TEST EF   2/19 Echo   60-65 %   4/22 Echo   55-60 %            Date Cr  K TSH  2/19 1.03 3.6 3.42   7/19 0.91 3.9     She continues with significant exercise intolerance.  She is short of breath particularly walking outside but even in the grocery stores.  Needs to use a wheelchair.  Struggles with lassitude.  She was much more engaged with physical activity when she was undergoing cardiac rehab.  Has not been able to get herself mobilized since then.  She affirms that she is depressed.  Back pain and knee pain are also limiting.  Somewhat volume overloaded.  Edematous.  Is not excited about diuretics because they make her urinate too much    Past Medical History:  Diagnosis Date   2nd degree AV block 06/22/2017   Arthritis    Asthma    Bone spur    Breast cancer, left breast ([place])    2012   Breast cancer, right breast ([place])    2021   Chronic bronchitis (HCC)    COPD (chronic obstructive pulmonary disease) ([place])    Diabetes mellitus without complication ([place])    Phreesia 22/29/7989   Diastolic heart failure ([place])    Essential hypertension    Fatty liver 2013   GERD (gastroesophageal reflux disease)    HSV (herpes simplex virus) infection    Hyperlipidemia    Lung nodule seen on imaging study 2013   Lymphedema    Left arm   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    Osteoarthritis    both knees, lower back, both shoulders; bone spurs to feet   Rectocele 06/25/2013   Posterior repair 11/06/13    Type II diabetes mellitus ([place])    Ventral hernia     Past  Surgical History:  Procedure Laterality Date   BREAST BIOPSY Left 04/2011   BREAST BIOPSY Left 06/22/13   BREAST BIOPSY Left 09/12/2013   Procedure: BREAST BIOPSY WITH NEEDLE LOCALIZATION;  Surgeon: Jamesetta So, MD;  Location: [facility];  Service: General;  Laterality: Left;   BREAST LUMPECTOMY Left 04/2011 X 2   BREAST SURGERY N/A    Phreesia 01/17/2020   CATARACT EXTRACTION W/ INTRAOCULAR LENS  IMPLANT, BILATERAL Bilateral    CATARACT EXTRACTION, BILATERAL     on different occassions    CESAREAN [place]   COLON SURGERY N/A    2014   DILATION AND CURETTAGE OF UTERUS  11/06/2013   HAND LIGAMENT RECONSTRUCTION Right ~ 2010   HYSTEROSCOPY WITH D & C N/A 11/06/2013   Procedure: DILATATION AND CURETTAGE /HYSTEROSCOPY;  Surgeon: Jonnie Kind, MD;  Location: [facility];  Service: Gynecology;  Laterality: N/A;   INSERT / REPLACE / REMOVE PACEMAKER  06/22/2017   LUNG LOBECTOMY Right 1988   Fungal Infection   PACEMAKER IMPLANT N/A 06/22/2017   Procedure: PACEMAKER IMPLANT;  Surgeon: Deboraha Sprang, MD;  Location: [facility];  Service:  Cardiovascular;  Laterality: N/A;   PARTIAL MASTECTOMY WITH NEEDLE LOCALIZATION Right 01/02/2020   Procedure: RIGHT PARTIAL MASTECTOMY AFTER NEEDLE LOCALIZATION;  Surgeon: Aviva Signs, MD;  Location: [facility];  Service: General;  Laterality: Right;   POLYPECTOMY N/A 11/06/2013   Procedure: POLYPECTOMY (REMOVAL ENDOMETRIAL POLYP);  Surgeon: Jonnie Kind, MD;  Location: [facility];  Service: Gynecology;  Laterality: N/A;   RECTOCELE REPAIR N/A 11/06/2013   Procedure: POSTERIOR REPAIR (RECTOCELE);  Surgeon: Jonnie Kind, MD;  Location: [facility];  Service: Gynecology;  Laterality: N/A;   SHOULDER ARTHROSCOPY WITH OPEN ROTATOR CUFF REPAIR Left 07/26/2012   TONSILLECTOMY  1949   TUBAL LIGATION      Current Meds  Medication Sig   acetaminophen (TYLENOL) 500 MG tablet Take 1,000 mg by mouth every 6 (six) hours as needed for moderate pain.    albuterol (VENTOLIN  HFA) 108 (90 Base) MCG/ACT inhaler Inhale 2 puffs into the lungs every 6 (six) hours as needed for wheezing or shortness of breath.   Aloe Vera Leaf POWD Take 800 mg by mouth 2 (two) times daily.   Ascorbic Acid (VITAMIN C) 1000 MG tablet Take 1,000 mg by mouth 2 (two) times daily.   b complex vitamins tablet Take 1 tablet by mouth 2 (two) times daily.    Calcium Polycarbophil (FIBER-CAPS PO) Take by mouth every other day.   Chlorpheniramine-DM (CORICIDIN COUGH/COLD) 4-30 MG TABS Take 1 tablet by mouth every 6 (six) hours as needed.   Cholecalciferol (VITAMIN D3) 2000 UNITS TABS Take 2,000 Units by mouth 2 (two) times daily.   Dulaglutide (TRULICITY) 9.93 ZJ/6.9CV SOPN Inject 0.75 mg into the skin once a week.   fluocinonide cream (LIDEX) 8.93 % Apply 1 application topically 2 (two) times daily as needed (poison ivy/bites/hives).   Ginkgo Biloba 120 MG CAPS Take 120 mg by mouth in the morning and at bedtime.    glipiZIDE (GLUCOTROL) 10 MG tablet TAKE 1/2 (ONE-HALF) TABLET BY MOUTH TWICE DAILY BEFORE A MEAL FOR DIABETES   Krill Oil 1000 MG CAPS Take 1,000 mg by mouth 2 (two) times daily.    LORazepam (ATIVAN) 0.5 MG tablet Take 1 tablet (0.5 mg total) by mouth 2 (two) times daily as needed for anxiety.   metFORMIN (GLUCOPHAGE) 1000 MG tablet TAKE 1 TABLET BY MOUTH TWICE DAILY WITH A MEAL FOR DIABETES   metoprolol succinate (TOPROL-XL) 25 MG 24 hr tablet Take 0.5 tablets (12.5 mg total) by mouth daily.   Multiple Vitamin (MULTIVITAMIN WITH MINERALS) TABS tablet Take 1 tablet by mouth daily.   naftifine (NAFTIN) 1 % cream Apply topically 2 (two) times daily as needed.   rosuvastatin (CRESTOR) 5 MG tablet Take 1 tablet (5 mg total) by mouth daily. for cholesterol.   simethicone (MYLICON) 80 MG chewable tablet Chew 80 mg by mouth as needed for flatulence.    tiZANidine (ZANAFLEX) 4 MG tablet Take 1 tablet (4 mg total) by mouth every 6 (six) hours as needed for muscle spasms. (Patient taking  differently: Take 4 mg by mouth at bedtime.)   torsemide (DEMADEX) 20 MG tablet Take 10 mg by mouth at bedtime.   UNABLE TO FIND Please dispense custom molded foot orthotics and heel spurs Dx: E11.30    Allergies  Allergen Reactions   Arimidex [Anastrozole] Other (See Comments)    Arthralgias and myalgias.  Improved with 3 week hiatus from drug.  Categorical side effect of drug class.   Other Hives and Itching   Adhesive [Tape] Other (  See Comments)    "red and blistered"   Lisinopril     Dry hacking cough and tickle   Tetanus Toxoids    Tetracyclines & Related Other (See Comments)    unknown      Review of Systems negative except from HPI and PMH BP 134/76   Pulse 84   Ht 5\' 5"  (1.651 m)   Wt 228 lb (103.4 kg)   SpO2 98%   BMI 37.94 kg/m   Well developed and well nourished in no acute distress HENT normal Neck supple with JVP-flat Clear Device pocket well healed; without hematoma or erythema.  There is no tethering  Regular rate and rhythm, no  gallop 2/6 murmur Abd-soft with active BS No Clubbing cyanosis tr edema Skin-warm and dry A & Oriented  Grossly normal sensory and motor function  ECG sinus with P synchronous pacing at 84 Intervals 18/17/43     Assessment and  Plan  Mobitz 2 second-degree heart block Intermittent  Dyspnea on exertion  HFpEF chronic-   Pacemaker St Jude   Irregularity and intermittent ventricular pacing  Depression  Shortness of breath remains multifactorial.  She is volume overloaded.  She is agreeable today to increase her diuretics from Demadex 40 as needed to Demadex 40 3 times a week.  She may be a candidate, particularly with her diabetes and her borderline LVEF for an SGLT2.  Will reach out to her primary care physician as it would be exchanged for one of her other hypoglycemic agents  Device function is normal.  Not withstanding high-volume pacing, there is no evidence of change in left ventricular function.  Lengthy  discussion regarding depression lassitude and the importance of exercise.  Suggested CUBII.  Exercise bike, and to discuss with her PCP therapies for her depression      Current medicines are reviewed at length with the patient today .  The patient does not  have concerns regarding medicines.

## 2020-11-06 NOTE — Patient Instructions (Addendum)
Medication Instructions:  Begin Torsemide 20 mg, 1 tablet by mouth 3 days per week.  *If you need a refill on your cardiac medications before your next appointment, please call your pharmacy*   Lab Work: None If you have labs (blood work) drawn today and your tests are completely normal, you will receive your results only by: [place] (if you have MyChart) OR A paper copy in the mail If you have any lab test that is abnormal or we need to change your treatment, we will call you to review the results.   Testing/Procedures: None   Follow-Up: At [facility], you and your health needs are our priority.  As part of our continuing mission to provide you with exceptional heart care, we have created designated Provider Care Teams.  These Care Teams include your primary Cardiologist (physician) and Advanced Practice Providers (APPs -  Physician Assistants and Nurse Practitioners) who all work together to provide you with the care you need, when you need it.  We recommend signing up for the patient portal called "MyChart".  Sign up information is provided on this After Visit Summary.  MyChart is used to connect with patients for Virtual Visits (Telemedicine).  Patients are able to view lab/test results, encounter notes, upcoming appointments, etc.  Non-urgent messages can be sent to your provider as well.   To learn more about what you can do with MyChart, go to [email or web address].    Your next appointment:   1 year(s)  The format for your next appointment:   In Person  Provider:   Dr Caryl Comes

## 2020-11-07 ENCOUNTER — Other Ambulatory Visit: Payer: Self-pay | Admitting: Nurse Practitioner

## 2020-11-07 MED ORDER — DAPAGLIFLOZIN PROPANEDIOL 10 MG PO TABS: 10.0000 mg | ORAL_TABLET | Freq: Every day | ORAL | 1 refills | Status: DC

## 2020-11-07 NOTE — Progress Notes (Unsigned)
-  Cardiology recommended SGLT2i for heart failure. -STOP trulicity  -Rx. Farxiga; GFR > 60 with last labs, so 10 mg dose initiated -cardiology also recommended antidepressant; will have her come in to discuss this

## 2020-11-10 ENCOUNTER — Other Ambulatory Visit: Payer: Self-pay

## 2020-11-10 ENCOUNTER — Inpatient Hospital Stay (HOSPITAL_BASED_OUTPATIENT_CLINIC_OR_DEPARTMENT_OTHER): Payer: Medicare Other | Admitting: Physician Assistant

## 2020-11-10 VITALS — BP 153/73 | HR 75 | Temp 97.1°F | Resp 18 | Wt 225.7 lb

## 2020-11-10 DIAGNOSIS — Z170 Estrogen receptor positive status [ER+]: Secondary | ICD-10-CM | POA: Diagnosis not present

## 2020-11-10 DIAGNOSIS — K5900 Constipation, unspecified: Secondary | ICD-10-CM | POA: Diagnosis not present

## 2020-11-10 DIAGNOSIS — C50511 Malignant neoplasm of lower-outer quadrant of right female breast: Secondary | ICD-10-CM | POA: Diagnosis not present

## 2020-11-10 DIAGNOSIS — I890 Lymphedema, not elsewhere classified: Secondary | ICD-10-CM | POA: Diagnosis not present

## 2020-11-10 DIAGNOSIS — J449 Chronic obstructive pulmonary disease, unspecified: Secondary | ICD-10-CM | POA: Diagnosis not present

## 2020-11-10 DIAGNOSIS — C50911 Malignant neoplasm of unspecified site of right female breast: Secondary | ICD-10-CM

## 2020-11-10 DIAGNOSIS — C50912 Malignant neoplasm of unspecified site of left female breast: Secondary | ICD-10-CM

## 2020-11-10 DIAGNOSIS — E669 Obesity, unspecified: Secondary | ICD-10-CM | POA: Diagnosis not present

## 2020-11-10 NOTE — Progress Notes (Signed)
[place], [place] [address]   CLINIC:  Medical Oncology/Hematology  PCP:  Noreene Larsson, NP [address]  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, a 80 y.o. female, returns for routine follow-up of her history of left breast cancer (2013) and more recently diagnosed right-sided breast cancer (2021). Julie Clay was last seen on 02/07/2020 by Dr. Delton Coombes.  At today's visit, she reports feeling fairly well.  She denies any recent hospitalizations, surgeries, or changes in her baseline health status.  She has some lymphedema in right arm from right shoulder surgery.  She has previously been seen at the lymphedema, but did find any relief from the lymphedema sleeve. Her husband performs lymphedema massage on her at home, which does help.  She does have mild breast pain bilaterally, which she attributes to her left-sided pacemaker and her right-sided lymphedema.  Breast pain is mild, intermittent, and only occurs on rare occasions.  She does have chronic dyspnea on exertion (stable at baseline) which she attributes to her  asthma, obesity, and deconditioning.   She denies any symptoms concerning for breast cancer recurrence such as new lumps, bone pain, chest pain, or abdominal pain.  She denies any new persistent headaches, seizures, or neurologic deficits.  No fever, chills, night sweats, unintentional weight loss.  She reports that her energy is 25% with very little stamina, with 75% appetite.  She is maintaining a stable weight at this time.   REVIEW OF SYSTEMS:  Review of Systems  Constitutional:  Positive for fatigue. Negative for appetite change, chills, diaphoresis, fever and unexpected weight change.  HENT:   Negative for lump/mass and nosebleeds.   Eyes:  Negative for eye problems.  Respiratory:  Positive for shortness of breath (with exertion). Negative for cough and hemoptysis.   Cardiovascular:  Negative for chest pain, leg swelling and palpitations.  Gastrointestinal:  Positive for constipation and diarrhea. Negative for abdominal pain, blood in stool, nausea and vomiting.  Genitourinary:  Positive for frequency. Negative for hematuria.   Skin: Negative.   Neurological:  Positive for numbness. Negative for dizziness, headaches and light-headedness.  Hematological:  Does not bruise/bleed easily.  Psychiatric/Behavioral:  Positive for depression. Negative for suicidal ideas.    PAST MEDICAL/SURGICAL HISTORY:  Past Medical History:  Diagnosis Date   2nd degree AV block 06/22/2017   Arthritis    Asthma    Bone spur    Breast cancer, left breast ([place])    2012   Breast cancer, right breast ([place])    2021   Chronic bronchitis (HCC)    COPD (chronic obstructive pulmonary disease) (HCC)    Diabetes mellitus without complication ([place])  Phreesia 01/75/1025   Diastolic heart failure (HCC)    Essential hypertension    Fatty liver 2013   GERD (gastroesophageal reflux disease)    HSV (herpes simplex virus) infection    Hyperlipidemia    Lung nodule seen on imaging study 2013   Lymphedema     Left arm   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    Osteoarthritis    both knees, lower back, both shoulders; bone spurs to feet   Rectocele 06/25/2013   Posterior repair 11/06/13    Type II diabetes mellitus ([place])    Ventral hernia    Past Surgical History:  Procedure Laterality Date   BREAST BIOPSY Left 04/2011   BREAST BIOPSY Left 06/22/13   BREAST BIOPSY Left 09/12/2013   Procedure: BREAST BIOPSY WITH NEEDLE LOCALIZATION;  Surgeon: Jamesetta So, MD;  Location: [facility];  Service: General;  Laterality: Left;   BREAST LUMPECTOMY Left 04/2011 X 2   BREAST SURGERY N/A    Phreesia 01/17/2020   CATARACT EXTRACTION W/ INTRAOCULAR LENS  IMPLANT, BILATERAL Bilateral    CATARACT EXTRACTION, BILATERAL     on different occassions    CESAREAN [place]   COLON SURGERY N/A    2014   DILATION AND CURETTAGE OF UTERUS  11/06/2013   HAND LIGAMENT RECONSTRUCTION Right ~ 2010   HYSTEROSCOPY WITH D & C N/A 11/06/2013   Procedure: DILATATION AND CURETTAGE /HYSTEROSCOPY;  Surgeon: Jonnie Kind, MD;  Location: [facility];  Service: Gynecology;  Laterality: N/A;   INSERT / REPLACE / REMOVE PACEMAKER  06/22/2017   LUNG LOBECTOMY Right 1988   Fungal Infection   PACEMAKER IMPLANT N/A 06/22/2017   Procedure: PACEMAKER IMPLANT;  Surgeon: Deboraha Sprang, MD;  Location: [facility];  Service: Cardiovascular;  Laterality: N/A;   PARTIAL MASTECTOMY WITH NEEDLE LOCALIZATION Right 01/02/2020   Procedure: RIGHT PARTIAL MASTECTOMY AFTER NEEDLE LOCALIZATION;  Surgeon: Aviva Signs, MD;  Location: [facility];  Service: General;  Laterality: Right;   POLYPECTOMY N/A 11/06/2013   Procedure: POLYPECTOMY (REMOVAL ENDOMETRIAL POLYP);  Surgeon: Jonnie Kind, MD;  Location: [facility];  Service: Gynecology;  Laterality: N/A;   RECTOCELE REPAIR N/A 11/06/2013   Procedure: POSTERIOR REPAIR (RECTOCELE);  Surgeon: Jonnie Kind, MD;  Location: [facility];  Service: Gynecology;  Laterality: N/A;   SHOULDER ARTHROSCOPY WITH  OPEN ROTATOR CUFF REPAIR Left 07/26/2012   TONSILLECTOMY  1949   TUBAL LIGATION      SOCIAL HISTORY:  Social History   Socioeconomic History   Marital status: Married    Spouse name: Not on file   Number of children: Not on file   Years of education: Not on file   Highest education level: Not on file  Occupational History   Occupation: retired     Comment: Product manager for [employer]  Tobacco Use   Smoking status: Never   Smokeless tobacco: Never  Vaping Use   Vaping Use: Never used  Substance and Sexual Activity   Alcohol use: Yes    Alcohol/week: 0.0 standard drinks    Comment: 06/22/2017 "might have a drink twice a year"   Drug use: No   Sexual activity: Not on file  Other Topics Concern   Not on file  Social History Narrative   Not on file   Social Determinants of Health   Financial Resource Strain: Low Risk    Difficulty of Paying Living Expenses: Not very hard  Food Insecurity: Not on file  Transportation Needs: Not on file  Physical Activity: Not on file  Stress: Not on file  Social Connections: Not on file  Intimate Partner Violence: Not on file    FAMILY HISTORY:  Family History  Problem Relation Age of Onset   Hypertension Mother    Heart disease Mother    Diabetes Mother    Kidney disease Mother        ESRD   Cancer Mother        Bladder   Hypertension Father    Hyperlipidemia Father    Heart disease Father    Diabetes Father    Cancer Father        Stomach Cancer   Diabetes Sister    Heart disease Sister    Alzheimer's disease Paternal Aunt    Stroke Maternal Grandfather    Cancer Paternal Grandfather        stomach   Cancer Maternal Uncle        prostate   Cancer Paternal Uncle        stomach    CURRENT MEDICATIONS:  Current Outpatient Medications  Medication Sig Dispense Refill   acetaminophen (TYLENOL) 500 MG tablet Take 1,000 mg by mouth every 6 (six) hours as needed for moderate pain.      albuterol (VENTOLIN HFA) 108  (90 Base) MCG/ACT inhaler Inhale 2 puffs into the lungs every 6 (six) hours as needed for wheezing or shortness of breath. 8 g 0   Aloe Vera Leaf POWD Take 800 mg by mouth 2 (two) times daily.     Ascorbic Acid (VITAMIN C) 1000 MG tablet Take 1,000 mg by mouth 2 (two) times daily.     b complex vitamins tablet Take 1 tablet by mouth 2 (two) times daily.      Blood Glucose Monitoring Suppl (BLOOD GLUCOSE SYSTEM PAK) KIT Use as directed to monitor FSBS 1x daily. Dx: E11.9. Please dispense as Accu-Chek Aviva (Patient not taking: Reported on 11/06/2020) 1 each 1   Calcium Polycarbophil (FIBER-CAPS PO) Take by mouth every other day.     Chlorpheniramine-DM (CORICIDIN COUGH/COLD) 4-30 MG TABS Take 1 tablet by mouth every 6 (six) hours as needed. 28 tablet 0   Cholecalciferol (VITAMIN D3) 2000 UNITS TABS Take 2,000 Units by mouth 2 (two) times daily.     dapagliflozin propanediol (FARXIGA) 10 MG TABS tablet Take 1 tablet (10 mg total) by mouth daily before breakfast. 90 tablet 1   fluocinonide cream (LIDEX) 2.35 % Apply 1 application topically 2 (two) times daily as needed (poison ivy/bites/hives). 30 g 11   Ginkgo Biloba 120 MG CAPS Take 120 mg by mouth in the morning and at bedtime.      glipiZIDE (GLUCOTROL) 10 MG tablet TAKE 1/2 (ONE-HALF) TABLET BY MOUTH TWICE DAILY BEFORE A MEAL FOR DIABETES 180 tablet 0   Glucose Blood (BLOOD GLUCOSE TEST STRIPS) STRP Use as directed to monitor FSBS 1x daily. Dx: E11.9. Please dispense as Accu-Chek Aviva (Patient not taking: Reported on 11/06/2020) 100 each 1   Krill Oil 1000 MG CAPS Take 1,000 mg by mouth 2 (two) times daily.      Lancets MISC Use as directed to monitor FSBS 1x daily. Dx: E11.9. Please dispense as Accu-Chek Aviva (Patient not taking: Reported on 11/06/2020) 100 each 1   LORazepam (ATIVAN) 0.5 MG tablet Take 1 tablet (0.5 mg total) by mouth 2 (two) times daily as needed for anxiety. 30 tablet 1   melatonin  5 MG TABS Take 5 mg by mouth at bedtime as  needed (sleep).  (Patient not taking: Reported on 11/06/2020)     metFORMIN (GLUCOPHAGE) 1000 MG tablet TAKE 1 TABLET BY MOUTH TWICE DAILY WITH A MEAL FOR DIABETES 180 tablet 1   metoprolol succinate (TOPROL-XL) 25 MG 24 hr tablet Take 0.5 tablets (12.5 mg total) by mouth daily. 45 tablet 3   Multiple Vitamin (MULTIVITAMIN WITH MINERALS) TABS tablet Take 1 tablet by mouth daily.     naftifine (NAFTIN) 1 % cream Apply topically 2 (two) times daily as needed. 60 g 2   omeprazole (PRILOSEC) 40 MG capsule Take 40 mg by mouth every morning. (Patient not taking: Reported on 11/06/2020)     rosuvastatin (CRESTOR) 5 MG tablet Take 1 tablet (5 mg total) by mouth daily. for cholesterol. 90 tablet 1   simethicone (MYLICON) 80 MG chewable tablet Chew 80 mg by mouth as needed for flatulence.      tiZANidine (ZANAFLEX) 4 MG tablet Take 1 tablet (4 mg total) by mouth every 6 (six) hours as needed for muscle spasms. (Patient taking differently: Take 4 mg by mouth at bedtime.) 180 tablet 1   torsemide (DEMADEX) 20 MG tablet Take 20 mg by mouth 3 (three) times a week.     UNABLE TO FIND Please dispense custom molded foot orthotics and heel spurs Dx: E11.30 1 each 0   No current facility-administered medications for this visit.    ALLERGIES:  Allergies  Allergen Reactions   Arimidex [Anastrozole] Other (See Comments)    Arthralgias and myalgias.  Improved with 3 week hiatus from drug.  Categorical side effect of drug class.   Other Hives and Itching   Adhesive [Tape] Other (See Comments)    "red and blistered"   Lisinopril     Dry hacking cough and tickle   Tetanus Toxoids    Tetracyclines & Related Other (See Comments)    unknown    PHYSICAL EXAM:  Performance status (ECOG): 2 - Symptomatic, <50% confined to bed  There were no vitals filed for this visit. Wt Readings from Last 3 Encounters:  11/06/20 228 lb (103.4 kg)  10/16/20 228 lb (103.4 kg)  07/30/20 222 lb (100.7 kg)   Physical  Exam Constitutional:      Appearance: Normal appearance. She is obese.  HENT:     Head: Normocephalic and atraumatic.     Mouth/Throat:     Mouth: Mucous membranes are moist.  Eyes:     Extraocular Movements: Extraocular movements intact.     Pupils: Pupils are equal, round, and reactive to light.  Cardiovascular:     Rate and Rhythm: Normal rate and regular rhythm.     Pulses: Normal pulses.     Heart sounds: Normal heart sounds.  Pulmonary:     Effort: Pulmonary effort is normal.     Breath sounds: Normal breath sounds.  Chest:       Comments: Scar tissue and fibrous tissue palpated in bilateral breasts.  No discrete nodule or mass appreciated on exam. Abdominal:     General: Bowel sounds are normal.     Palpations: Abdomen is soft.     Tenderness: There is no abdominal tenderness.  Musculoskeletal:        General: No swelling.     Right lower leg: No edema.     Left lower leg: No edema.  Lymphadenopathy:     Cervical: No cervical adenopathy.  Skin:    General: Skin  is warm and dry.  Neurological:     General: No focal deficit present.     Mental Status: She is alert and oriented to person, place, and time.  Psychiatric:        Mood and Affect: Mood normal.        Behavior: Behavior normal.     LABORATORY DATA:  I have reviewed the labs as listed.  CBC Latest Ref Rng & Units 10/22/2020 07/28/2020 04/08/2020  WBC 4.0 - 10.5 K/uL 11.0(H) 8.9 8.1  Hemoglobin 12.0 - 15.0 g/dL 14.8 14.5 13.1  Hematocrit 36.0 - 46.0 % 45.3 43.2 39.2  Platelets 150 - 400 K/uL 302 270 225   CMP Latest Ref Rng & Units 10/22/2020 07/28/2020 04/08/2020  Glucose 70 - 99 mg/dL 76 115(H) 106(H)  BUN 8 - 23 mg/dL 19 17 18   Creatinine 0.44 - 1.00 mg/dL 0.80 0.81 0.79  Sodium 135 - 145 mmol/L 140 143 143  Potassium 3.5 - 5.1 mmol/L 4.0 4.5 4.4  Chloride 98 - 111 mmol/L 105 105 107  CO2 22 - 32 mmol/L 24 21 28   Calcium 8.9 - 10.3 mg/dL 9.6 9.7 9.5  Total Protein 6.5 - 8.1 g/dL 7.7 6.8 5.9(L)   Total Bilirubin 0.3 - 1.2 mg/dL 0.7 0.5 0.4  Alkaline Phos 38 - 126 U/L 53 50 -  AST 15 - 41 U/L 27 17 17   ALT 0 - 44 U/L 23 16 12     DIAGNOSTIC IMAGING:  I have independently reviewed the scans and discussed with the patient. MM DIAG BREAST TOMO BILATERAL  Result Date: 10/22/2020 CLINICAL DATA:  80 year old female for annual follow-up. History of RIGHT breast cancer and lumpectomy in 2021. History of LEFT breast cancer and lumpectomy in 2012. EXAM: DIGITAL DIAGNOSTIC BILATERAL MAMMOGRAM WITH CAD AND TOMO COMPARISON:  Previous exam(s). ACR Breast Density Category c: The breast tissue is heterogeneously dense, which may obscure small masses. FINDINGS: 2D and 3D full field views of both breasts and a magnification view of the lumpectomy site demonstrate no suspicious mass, nonsurgical distortion or worrisome calcifications. Surgical changes within both breasts again noted. Mammographic images were processed with CAD. IMPRESSION: No evidence of breast malignancy. RECOMMENDATION: Bilateral diagnostic mammogram in 1 year. I have discussed the findings and recommendations with the patient. If applicable, a reminder letter will be sent to the patient regarding the next appointment. BI-RADS CATEGORY  2: Benign. Electronically Signed   By: Margarette Canada M.D.   On: 10/22/2020 14:14    ASSESSMENT & PLAN: 1.  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 -Anastrozole from March 2013 through December 2013, discontinued secondary to arthralgias and myalgias. - Took tamoxifen for 4 years from January 2014 through January 2018. - Left breast ADH and ALH, status post lumpectomy on 09/12/2013. - Mammogram of the right breast on 02/08/2017 showed new finding and biopsy was recommended. -Mammogram and ultrasound of the right breast done on 10/04/2017 showed BI-RADS Category 4 with indeterminate hyperdense mass without ultrasound correlate.   Stereotactic biopsy was recommended and declined by patient. -Mammogram on 10/12/2019 shows BI-RADS Category 0 in the right breast, with need for additional imaging evaluation (discussed below) - Most recent mammogram (10/22/2020): Surgical changes within both breasts are noted but without any evidence of breast malignancy; BI-RADS Category 2, benign - PLAN: RTC in 6 months for office visit.  Bilateral diagnostic mammogram in 1 year.  We will plan to stagger her annual  mammograms in between her annual office visits.  2.  Stage I (T1CNX) right breast cancer, ER/PR positive, HER2 negative: -Right breast additional views on 11/06/2019 shows 0.8 x 0.6 x 0.5 cm irregular hypoechoic mass in the right breast at the 6 to 7 o'clock position. -Right breast biopsy on 11/28/2019 with IDC with DCIS.  IDC was positive for ER/PR 100%.  Ki-67 5%.  HER-2 negative. -Right lumpectomy shows 1.4 cm IDC, grade 2, clear margins.  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 - Patient was recommended to take adjuvant antiestrogen therapy with tamoxifen, which she previously tolerated well for 4 years.  (She was previously unable to tolerate anastrozole).  However, patient declined any antiestrogen therapy. - Patient has declined radiation. - Most recent mammogram (10/22/2020): Surgical changes within both breasts are noted but without any evidence of breast malignancy; BI-RADS Category 2, benign - PLAN: RTC in 6 months for office visit.  Bilateral diagnostic mammogram in 1 year.  We will plan to stagger her annual mammograms in between her annual office visits.  3.  Breast cancer therapy associated bone loss: -She will continue vitamin D and weightbearing exercises.   PLAN SUMMARY & DISPOSITION: - RTC in 6 months with labs the week before - Bilateral diagnostic mammogram in 1 year.   All questions were answered. The patient knows to call the clinic with any problems, questions or concerns.  Medical decision  making: Low  Time spent on visit: I spent 20 minutes counseling the patient face to face. The total time spent in the appointment was 30 minutes and more than 50% was on counseling.   Harriett Rush, PA-C  11/10/2020 1:56 PM

## 2020-11-10 NOTE — Patient Instructions (Signed)
[place] at [facility] Discharge Instructions  You were seen today by Tarri Abernethy PA-C for your history of breast cancer.  LABS: Return in 6 months for repeat labs  OTHER TESTS:  Mammogram in 1 year  MEDICATIONS: No changes to home medications  FOLLOW-UP APPOINTMENT: Office visit in 6 months   Thank you for choosing [place] at [facility] to provide your oncology and hematology care.  To afford each patient quality time with our provider, please arrive at least 15 minutes before your scheduled appointment time.   If you have a lab appointment with the [facility] please come in thru the Main Entrance and check in at the main information desk.  You need to re-schedule your appointment should you arrive 10 or more minutes late.  We strive to give you quality time with our providers, and arriving late affects you and other patients whose appointments are after yours.  Also, if you no show three or more times for appointments you may be dismissed from the clinic at the providers discretion.     Again, thank you for choosing [facility].  Our hope is that these requests will decrease the amount of time that you wait before being seen by our physicians.       _____________________________________________________________  Should you have questions after your visit to [facility], please contact our office at [phone] and follow the prompts.  Our office hours are 8:00 a.m. and 4:30 p.m. Monday - Friday.  Please note that voicemails left after 4:00 p.m. may not be returned until the following business day.  We are closed weekends and major holidays.  You do have access to a nurse 24-7, just call the main number to the clinic [phone] and do not press any options, hold on the line and a nurse will answer the phone.    For prescription refill requests, have your pharmacy contact our office and allow 72  hours.    Due to Covid, you will need to wear a mask upon entering the hospital. If you do not have a mask, a mask will be given to you at the Main Entrance upon arrival. For doctor visits, patients may have 1 support person age 5 or older with them. For treatment visits, patients can not have anyone with them due to social distancing guidelines and our immunocompromised population.

## 2020-11-14 ENCOUNTER — Encounter: Payer: Self-pay | Admitting: Nurse Practitioner

## 2020-11-14 ENCOUNTER — Ambulatory Visit (INDEPENDENT_AMBULATORY_CARE_PROVIDER_SITE_OTHER): Payer: Medicare Other | Admitting: Nurse Practitioner

## 2020-11-14 ENCOUNTER — Other Ambulatory Visit: Payer: Self-pay

## 2020-11-14 VITALS — BP 147/83 | HR 80 | Temp 97.6°F | Ht 65.0 in | Wt 227.0 lb

## 2020-11-14 DIAGNOSIS — F321 Major depressive disorder, single episode, moderate: Secondary | ICD-10-CM | POA: Diagnosis not present

## 2020-11-14 MED ORDER — SERTRALINE HCL 50 MG PO TABS: 50.0000 mg | ORAL_TABLET | Freq: Every day | ORAL | 3 refills | Status: DC

## 2020-11-14 NOTE — Progress Notes (Signed)
Acute Office Visit  Subjective:    Patient ID: Julie Clay, female    DOB: Jan 04, 1941, 80 y.o.   MRN: [id number]  Chief Complaint  Patient presents with   Depression    Recently because of Covid/pandemic.    Depression        Associated symptoms include no suicidal ideas. Patient is in today for depression. She told her cardiologist that she had little energy and stamina, and after work-up he suggested an antidepressant.  She states that she has been down and depressed after her volunteer opportunities have been cut d/t the [place] pandemic. She used to volunteer at [employer] and some other organizations, but she has been stuck at home.    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    Osteoarthritis    both knees, lower back, both shoulders; bone spurs to feet   Rectocele 06/25/2013   Posterior repair 11/06/13    Type II diabetes mellitus ([place])    Ventral hernia     Past Surgical History:  Procedure Laterality Date   BREAST BIOPSY Left 04/2011   BREAST BIOPSY Left 06/22/13   BREAST BIOPSY Left 09/12/2013   Procedure: BREAST BIOPSY WITH NEEDLE LOCALIZATION;  Surgeon: Jamesetta So, MD;  Location: [facility];  Service: General;  Laterality: Left;   BREAST LUMPECTOMY Left 04/2011 X 2   BREAST SURGERY N/A    Phreesia 01/17/2020   CATARACT EXTRACTION W/ INTRAOCULAR LENS  IMPLANT, BILATERAL  Bilateral    CATARACT EXTRACTION, BILATERAL     on different occassions    [place]   COLON SURGERY N/A    2014   DILATION AND CURETTAGE OF UTERUS  11/06/2013   HAND LIGAMENT RECONSTRUCTION Right ~ 2010   HYSTEROSCOPY WITH D & C N/A 11/06/2013   Procedure: DILATATION AND CURETTAGE /HYSTEROSCOPY;  Surgeon: Jonnie Kind, MD;  Location: [facility];  Service: Gynecology;  Laterality: N/A;   INSERT / REPLACE / REMOVE PACEMAKER  06/22/2017   LUNG LOBECTOMY Right 1988   Fungal Infection   PACEMAKER IMPLANT N/A 06/22/2017   Procedure: PACEMAKER IMPLANT;  Surgeon: Deboraha Sprang, MD;  Location: [facility];  Service: Cardiovascular;  Laterality: N/A;   PARTIAL MASTECTOMY WITH NEEDLE LOCALIZATION Right 01/02/2020   Procedure: RIGHT PARTIAL MASTECTOMY AFTER NEEDLE LOCALIZATION;  Surgeon: Aviva Signs, MD;  Location: [facility];  Service: General;  Laterality: Right;   POLYPECTOMY N/A 11/06/2013   Procedure: POLYPECTOMY (REMOVAL ENDOMETRIAL POLYP);  Surgeon: Jonnie Kind, MD;  Location: [facility];  Service: Gynecology;  Laterality: N/A;   RECTOCELE REPAIR N/A 11/06/2013   Procedure: POSTERIOR REPAIR (RECTOCELE);  Surgeon: Jonnie Kind, MD;  Location: [facility];  Service:  Gynecology;  Laterality: N/A;   SHOULDER ARTHROSCOPY WITH OPEN ROTATOR CUFF REPAIR Left 07/26/2012   TONSILLECTOMY  1949   TUBAL LIGATION      Family History  Problem Relation Age of Onset   Hypertension Mother    Heart disease Mother    Diabetes Mother    Kidney disease Mother        ESRD   Cancer Mother        Bladder   Hypertension Father    Hyperlipidemia Father    Heart disease Father    Diabetes Father    Cancer Father        Stomach Cancer   Diabetes Sister    Heart disease Sister    Alzheimer's disease Paternal Aunt    Stroke Maternal Grandfather    Cancer Paternal Grandfather        stomach   Cancer Maternal Uncle        prostate   Cancer Paternal Uncle        stomach    Social History    Socioeconomic History   Marital status: Married    Spouse name: Not on file   Number of children: Not on file   Years of education: Not on file   Highest education level: Not on file  Occupational History   Occupation: retired     Comment: Product manager for [employer]  Tobacco Use   Smoking status: Never   Smokeless tobacco: Never  Vaping Use   Vaping Use: Never used  Substance and Sexual Activity   Alcohol use: Yes    Alcohol/week: 0.0 standard drinks    Comment: 06/22/2017 "might have a drink twice a year"   Drug use: No   Sexual activity: Not on file  Other Topics Concern   Not on file  Social History Narrative   Not on file   Social Determinants of Health   Financial Resource Strain: Low Risk    Difficulty of Paying Living Expenses: Not very hard  Food Insecurity: Not on file  Transportation Needs: Not on file  Physical Activity: Not on file  Stress: Not on file  Social Connections: Not on file  Intimate Partner Violence: Not on file    Outpatient Medications Prior to Visit  Medication Sig Dispense Refill   acetaminophen (TYLENOL) 500 MG tablet Take 1,000 mg by mouth every 6 (six) hours as needed for moderate pain.     albuterol (VENTOLIN HFA) 108 (90 Base) MCG/ACT inhaler Inhale 2 puffs into the lungs every 6 (six) hours as needed for wheezing or shortness of breath. 8 g 0   Aloe Vera Leaf POWD Take 800 mg by mouth 2 (two) times daily.     Ascorbic Acid (VITAMIN C) 1000 MG tablet Take 1,000 mg by mouth 2 (two) times daily.     b complex vitamins tablet Take 1 tablet by mouth 2 (two) times daily.      Blood Glucose Monitoring Suppl (BLOOD GLUCOSE SYSTEM PAK) KIT Use as directed to monitor FSBS 1x daily. Dx: E11.9. Please dispense as Accu-Chek Aviva 1 each 1   Calcium Polycarbophil (FIBER-CAPS PO) Take by mouth every other day.     Chlorpheniramine-DM (CORICIDIN COUGH/COLD) 4-30 MG TABS Take 1 tablet by mouth every 6 (six) hours as needed. 28 tablet 0    Cholecalciferol (VITAMIN D3) 2000 UNITS TABS Take 2,000 Units by mouth 2 (two) times daily.     dapagliflozin propanediol (FARXIGA) 10 MG TABS tablet Take 1 tablet (  10 mg total) by mouth daily before breakfast. 90 tablet 1   fluocinonide cream (LIDEX) 4.65 % Apply 1 application topically 2 (two) times daily as needed (poison ivy/bites/hives). 30 g 11   Ginkgo Biloba 120 MG CAPS Take 120 mg by mouth in the morning and at bedtime.      glipiZIDE (GLUCOTROL) 10 MG tablet TAKE 1/2 (ONE-HALF) TABLET BY MOUTH TWICE DAILY BEFORE A MEAL FOR DIABETES 180 tablet 0   Glucose Blood (BLOOD GLUCOSE TEST STRIPS) STRP Use as directed to monitor FSBS 1x daily. Dx: E11.9. Please dispense as Accu-Chek Aviva 100 each 1   Krill Oil 1000 MG CAPS Take 1,000 mg by mouth 2 (two) times daily.      Lancets MISC Use as directed to monitor FSBS 1x daily. Dx: E11.9. Please dispense as Accu-Chek Aviva 100 each 1   LORazepam (ATIVAN) 0.5 MG tablet Take 1 tablet (0.5 mg total) by mouth 2 (two) times daily as needed for anxiety. 30 tablet 1   melatonin 5 MG TABS Take 5 mg by mouth at bedtime as needed (sleep).     metFORMIN (GLUCOPHAGE) 1000 MG tablet TAKE 1 TABLET BY MOUTH TWICE DAILY WITH A MEAL FOR DIABETES 180 tablet 1   metoprolol succinate (TOPROL-XL) 25 MG 24 hr tablet Take 0.5 tablets (12.5 mg total) by mouth daily. 45 tablet 3   Multiple Vitamin (MULTIVITAMIN WITH MINERALS) TABS tablet Take 1 tablet by mouth daily.     naftifine (NAFTIN) 1 % cream Apply topically 2 (two) times daily as needed. 60 g 2   omeprazole (PRILOSEC) 40 MG capsule Take 40 mg by mouth every morning.     rosuvastatin (CRESTOR) 5 MG tablet Take 1 tablet (5 mg total) by mouth daily. for cholesterol. 90 tablet 1   simethicone (MYLICON) 80 MG chewable tablet Chew 80 mg by mouth as needed for flatulence.      tiZANidine (ZANAFLEX) 4 MG tablet Take 1 tablet (4 mg total) by mouth every 6 (six) hours as needed for muscle spasms. 180 tablet 1   torsemide  (DEMADEX) 20 MG tablet Take 20 mg by mouth 3 (three) times a week.     UNABLE TO FIND Please dispense custom molded foot orthotics and heel spurs Dx: E11.30 1 each 0   No facility-administered medications prior to visit.    Allergies  Allergen Reactions   Arimidex [Anastrozole] Other (See Comments)    Arthralgias and myalgias.  Improved with 3 week hiatus from drug.  Categorical side effect of drug class.   Other Hives and Itching   Adhesive [Tape] Other (See Comments)    "red and blistered"   Lisinopril     Dry hacking cough and tickle   Tetanus Toxoids    Tetracyclines & Related Other (See Comments)    unknown    Review of Systems  Constitutional: Negative.   Respiratory: Negative.    Cardiovascular: Negative.   Psychiatric/Behavioral:  Positive for depression and dysphoric mood. Negative for self-injury and suicidal ideas.       Objective:    Physical Exam Constitutional:      Appearance: Normal appearance. She is obese.  Cardiovascular:     Rate and Rhythm: Normal rate and regular rhythm.     Pulses: Normal pulses.     Heart sounds: Normal heart sounds.  Pulmonary:     Effort: Pulmonary effort is normal.     Breath sounds: Normal breath sounds.  Neurological:     Mental Status: She is  alert.  Psychiatric:        Behavior: Behavior normal.        Thought Content: Thought content normal.        Judgment: Judgment normal.     Comments: PHQ-9 = 14    BP (!) 147/83 (BP Location: Right Arm, Patient Position: Sitting, Cuff Size: Large)   Pulse 80   Temp 97.6 F (36.4 C) (Temporal)   Ht 5' 5"  (1.651 m)   Wt 227 lb (103 kg)   SpO2 95%   BMI 37.77 kg/m  Wt Readings from Last 3 Encounters:  11/14/20 227 lb (103 kg)  11/10/20 225 lb 11.2 oz (102.4 kg)  11/06/20 228 lb (103.4 kg)    Health Maintenance Due  Topic Date Due   Zoster Vaccines- Shingrix (1 of 2) Never done   COVID-19 Vaccine (4 - Booster for Pfizer series) 07/08/2020    There are no preventive  care reminders to display for this patient.   Lab Results  Component Value Date   TSH 3.12 01/18/2020   Lab Results  Component Value Date   WBC 11.0 (H) 10/22/2020   HGB 14.8 10/22/2020   HCT 45.3 10/22/2020   MCV 84.8 10/22/2020   PLT 302 10/22/2020   Lab Results  Component Value Date   NA 140 10/22/2020   K 4.0 10/22/2020   CO2 24 10/22/2020   GLUCOSE 76 10/22/2020   BUN 19 10/22/2020   CREATININE 0.80 10/22/2020   BILITOT 0.7 10/22/2020   ALKPHOS 53 10/22/2020   AST 27 10/22/2020   ALT 23 10/22/2020   PROT 7.7 10/22/2020   ALBUMIN 4.1 10/22/2020   CALCIUM 9.6 10/22/2020   ANIONGAP 11 10/22/2020   EGFR 75 07/28/2020   Lab Results  Component Value Date   CHOL 146 07/28/2020   Lab Results  Component Value Date   HDL 44 07/28/2020   Lab Results  Component Value Date   LDLCALC 62 07/28/2020   Lab Results  Component Value Date   TRIG 249 (H) 07/28/2020   Lab Results  Component Value Date   CHOLHDL 2.7 04/08/2020   Lab Results  Component Value Date   HGBA1C 6.4 (H) 07/28/2020       Assessment & Plan:   Problem List Items Addressed This Visit       Other   Depression, major, single episode, moderate ([place]) - Primary    -PHQ-9 = 14 -Rx. Sertraline -f/u in 1 month       Relevant Medications   sertraline (ZOLOFT) 50 MG tablet     Meds ordered this encounter  Medications   sertraline (ZOLOFT) 50 MG tablet    Sig: Take 1 tablet (50 mg total) by mouth daily.    Dispense:  30 tablet    Refill:  Becker, NP

## 2020-11-14 NOTE — Assessment & Plan Note (Signed)
-  PHQ-9 = 14 -Rx. Sertraline -f/u in 1 month

## 2020-11-19 ENCOUNTER — Other Ambulatory Visit: Payer: Self-pay | Admitting: Nurse Practitioner

## 2020-11-19 ENCOUNTER — Telehealth: Payer: Self-pay | Admitting: Nurse Practitioner

## 2020-11-19 DIAGNOSIS — M8949 Other hypertrophic osteoarthropathy, multiple sites: Secondary | ICD-10-CM

## 2020-11-19 DIAGNOSIS — M159 Polyosteoarthritis, unspecified: Secondary | ICD-10-CM

## 2020-11-19 MED ORDER — DICLOFENAC SODIUM 1 % EX GEL: 2.0000 g | Freq: Four times a day (QID) | CUTANEOUS | 1 refills | Status: DC

## 2020-11-19 NOTE — Telephone Encounter (Signed)
I sent it in for her!

## 2020-11-19 NOTE — Telephone Encounter (Signed)
Pt husband says he was given diclofenac cream while he was in the hospital. Pt used this for her arthritis and it helped her very well. He wants to know if could send this in for pt to have her own tube. He does not want to bring her in. Please advise.

## 2020-11-19 NOTE — Telephone Encounter (Signed)
Pt has Diclofenac Called in

## 2020-11-19 NOTE — Progress Notes (Signed)
-  Pt called; had been using her husband's topical diclofenac for pain relief -Rx. Diclofenac for arthritis

## 2020-11-19 NOTE — Telephone Encounter (Signed)
Please call the pt also

## 2020-11-20 NOTE — Telephone Encounter (Signed)
Pt husband informed

## 2020-12-17 ENCOUNTER — Encounter: Payer: Self-pay | Admitting: Nurse Practitioner

## 2020-12-17 ENCOUNTER — Ambulatory Visit (INDEPENDENT_AMBULATORY_CARE_PROVIDER_SITE_OTHER): Payer: Medicare Other | Admitting: Nurse Practitioner

## 2020-12-17 ENCOUNTER — Encounter (INDEPENDENT_AMBULATORY_CARE_PROVIDER_SITE_OTHER): Payer: Self-pay

## 2020-12-17 ENCOUNTER — Other Ambulatory Visit: Payer: Self-pay

## 2020-12-17 DIAGNOSIS — F321 Major depressive disorder, single episode, moderate: Secondary | ICD-10-CM | POA: Diagnosis not present

## 2020-12-17 NOTE — Progress Notes (Signed)
Established Patient Office Visit  Subjective:  Patient ID: Julie Clay, female    DOB: 11/03/1940  Age: 80 y.o. MRN: [id number]  CC:  Chief Complaint  Patient presents with  . Depression    Follow up    HPI Julie Clay presents for med check. She was started on sertraline 50 mg for depression at last OV when her PHQ-9 = 14. She states she didn't notice enough of a difference to continue taking it.  Past Medical History:  Diagnosis Date  . 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 Arthritis   . Asthma   . Bone spur   . Breast cancer, left breast ([place])    2012  . Breast cancer, right breast ([place])    2021  . Chronic bronchitis ([place])   . COPD (chronic obstructive pulmonary disease) ([place])   . Diabetes mellitus without complication ([place])    Phreesia 04/08/2020  . Diastolic heart failure ([place])   . Essential hypertension   . Fatty liver 2013  . GERD (gastroesophageal reflux disease)   . HSV (herpes simplex virus) infection   . Hyperlipidemia   . Lung nodule seen on imaging study 2013  . Lymphedema    Left arm  . 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 Osteoarthritis    both knees, lower back, both shoulders; bone spurs to feet  . Rectocele 06/25/2013   Posterior repair 11/06/13   . Type II diabetes mellitus ([place])   . Ventral hernia     Past Surgical History:  Procedure Laterality Date  . BREAST BIOPSY Left 04/2011  . BREAST BIOPSY Left 06/22/13  . BREAST BIOPSY Left 09/12/2013   Procedure: BREAST BIOPSY WITH NEEDLE LOCALIZATION;  Surgeon: Jamesetta So, MD;  Location: [facility];  Service: General;  Laterality: Left;  . BREAST LUMPECTOMY Left 04/2011 X 2  . BREAST SURGERY N/A    Phreesia 01/17/2020  . CATARACT EXTRACTION W/ INTRAOCULAR LENS  IMPLANT, BILATERAL Bilateral   . CATARACT EXTRACTION, BILATERAL     on different occassions   . [place]  . COLON SURGERY N/A    2014  . DILATION AND CURETTAGE OF UTERUS  11/06/2013  . HAND LIGAMENT RECONSTRUCTION  Right ~ 2010  . HYSTEROSCOPY WITH D & C N/A 11/06/2013   Procedure: DILATATION AND CURETTAGE /HYSTEROSCOPY;  Surgeon: Jonnie Kind, MD;  Location: [facility];  Service: Gynecology;  Laterality: N/A;  . INSERT / REPLACE / REMOVE PACEMAKER  06/22/2017  . LUNG LOBECTOMY Right 1988   Fungal Infection  . PACEMAKER IMPLANT N/A 06/22/2017   Procedure: PACEMAKER IMPLANT;  Surgeon: Deboraha Sprang, MD;  Location: [facility];  Service: Cardiovascular;  Laterality: N/A;  . PARTIAL MASTECTOMY WITH NEEDLE LOCALIZATION Right 01/02/2020   Procedure: RIGHT PARTIAL MASTECTOMY AFTER NEEDLE LOCALIZATION;  Surgeon: Aviva Signs, MD;  Location: [facility];  Service: General;  Laterality: Right;  . POLYPECTOMY N/A 11/06/2013   Procedure: POLYPECTOMY (REMOVAL ENDOMETRIAL POLYP);  Surgeon: Jonnie Kind, MD;  Location: [facility];  Service: Gynecology;  Laterality: N/A;  . RECTOCELE REPAIR N/A 11/06/2013   Procedure: POSTERIOR REPAIR (RECTOCELE);  Surgeon: Jonnie Kind, MD;  Location: [facility];  Service: Gynecology;  Laterality: N/A;  . SHOULDER ARTHROSCOPY WITH OPEN ROTATOR CUFF REPAIR Left 07/26/2012  . TONSILLECTOMY  1949  . TUBAL LIGATION      Family History  Problem Relation Age of Onset  . Hypertension Mother   .  Heart disease Mother   . Diabetes Mother   . Kidney disease Mother        ESRD  . Cancer Mother        Bladder  . Hypertension Father   . Hyperlipidemia Father   . Heart disease Father   . Diabetes Father   . Cancer Father        Stomach Cancer  . Diabetes Sister   . Heart disease Sister   . Alzheimer's disease Paternal Aunt   . Stroke Maternal Grandfather   . Cancer Paternal Grandfather        stomach  . Cancer Maternal Uncle        prostate  . Cancer Paternal Uncle        stomach    Social History   Socioeconomic History  . Marital status: Married    Spouse name: Not on file  . Number of children: Not on file  . Years of education: Not on file  . Highest education level: Not  on file  Occupational History  . 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 Smoking status: Never  . Smokeless tobacco: Never  Vaping Use  . Vaping Use: Never used  Substance and Sexual Activity  . Alcohol use: Yes    Alcohol/week: 0.0 standard drinks    Comment: 06/22/2017 "might have a drink twice a year"  . Drug use: No  . Sexual activity: Not on file  Other Topics Concern  . Not on file  Social History Narrative  . Not on file   Social Determinants of Health   Financial Resource Strain: Low Risk   . Difficulty of Paying Living Expenses: Not very hard  Food Insecurity: Not on file  Transportation Needs: Not on file  Physical Activity: Not on file  Stress: Not on file  Social Connections: Not on file  Intimate Partner Violence: Not on file    Outpatient Medications Prior to Visit  Medication Sig Dispense Refill  . acetaminophen (TYLENOL) 500 MG tablet Take 1,000 mg by mouth every 6 (six) hours as needed for moderate pain.    Marland Kitchen albuterol (VENTOLIN HFA) 108 (90 Base) MCG/ACT inhaler Inhale 2 puffs into the lungs every 6 (six) hours as needed for wheezing or shortness of breath. 8 g 0  . Aloe Vera Leaf POWD Take 800 mg by mouth 2 (two) times daily.    . Ascorbic Acid (VITAMIN C) 1000 MG tablet Take 1,000 mg by mouth 2 (two) times daily.    Marland Kitchen b complex vitamins tablet Take 1 tablet by mouth 2 (two) times daily.     . Blood Glucose Monitoring Suppl (BLOOD GLUCOSE SYSTEM PAK) KIT Use as directed to monitor FSBS 1x daily. Dx: E11.9. Please dispense as Accu-Chek Aviva 1 each 1  . Calcium Polycarbophil (FIBER-CAPS PO) Take by mouth every other day.    . Cholecalciferol (VITAMIN D3) 2000 UNITS TABS Take 2,000 Units by mouth 2 (two) times daily.    . dapagliflozin propanediol (FARXIGA) 10 MG TABS tablet Take 1 tablet (10 mg total) by mouth daily before breakfast. 90 tablet 1  . diclofenac Sodium (VOLTAREN) 1 % GEL Apply 2 g topically 4  (four) times daily. 100 g 1  . fluocinonide cream (LIDEX) 6.73 % Apply 1 application topically 2 (two) times daily as needed (poison ivy/bites/hives). 30 g 11  . Ginkgo Biloba 120 MG CAPS Take 120 mg by mouth in the  morning and at bedtime.     Marland Kitchen glipiZIDE (GLUCOTROL) 10 MG tablet TAKE 1/2 (ONE-HALF) TABLET BY MOUTH TWICE DAILY BEFORE A MEAL FOR DIABETES 180 tablet 0  . Glucose Blood (BLOOD GLUCOSE TEST STRIPS) STRP Use as directed to monitor FSBS 1x daily. Dx: E11.9. Please dispense as Accu-Chek Aviva 100 each 1  . Krill Oil 1000 MG CAPS Take 1,000 mg by mouth 2 (two) times daily.     . Lancets MISC Use as directed to monitor FSBS 1x daily. Dx: E11.9. Please dispense as Accu-Chek Aviva 100 each 1  . LORazepam (ATIVAN) 0.5 MG tablet Take 1 tablet (0.5 mg total) by mouth 2 (two) times daily as needed for anxiety. 30 tablet 1  . melatonin 5 MG TABS Take 5 mg by mouth at bedtime as needed (sleep).    . metFORMIN (GLUCOPHAGE) 1000 MG tablet TAKE 1 TABLET BY MOUTH TWICE DAILY WITH A MEAL FOR DIABETES 180 tablet 1  . metoprolol succinate (TOPROL-XL) 25 MG 24 hr tablet Take 0.5 tablets (12.5 mg total) by mouth daily. 45 tablet 3  . Multiple Vitamin (MULTIVITAMIN WITH MINERALS) TABS tablet Take 1 tablet by mouth daily.    . naftifine (NAFTIN) 1 % cream Apply topically 2 (two) times daily as needed. 60 g 2  . omeprazole (PRILOSEC) 40 MG capsule Take 40 mg by mouth every morning.    . rosuvastatin (CRESTOR) 5 MG tablet Take 1 tablet (5 mg total) by mouth daily. for cholesterol. 90 tablet 1  . simethicone (MYLICON) 80 MG chewable tablet Chew 80 mg by mouth as needed for flatulence.     Marland Kitchen tiZANidine (ZANAFLEX) 4 MG tablet Take 1 tablet (4 mg total) by mouth every 6 (six) hours as needed for muscle spasms. 180 tablet 1  . torsemide (DEMADEX) 20 MG tablet Take 20 mg by mouth 3 (three) times a week.    Marland Kitchen UNABLE TO FIND Please dispense custom molded foot orthotics and heel spurs Dx: E11.30 1 each 0  . sertraline  (ZOLOFT) 50 MG tablet Take 1 tablet (50 mg total) by mouth daily. (Patient not taking: Reported on 12/17/2020) 30 tablet 3  . Chlorpheniramine-DM (CORICIDIN COUGH/COLD) 4-30 MG TABS Take 1 tablet by mouth every 6 (six) hours as needed. (Patient not taking: Reported on 12/17/2020) 28 tablet 0   No facility-administered medications prior to visit.    Allergies  Allergen Reactions  . Arimidex [Anastrozole] Other (See Comments)    Arthralgias and myalgias.  Improved with 3 week hiatus from drug.  Categorical side effect of drug class.  . Other Hives and Itching  . Adhesive [Tape] Other (See Comments)    "red and blistered"  . Lisinopril     Dry hacking cough and tickle  . Tetanus Toxoids   . Tetracyclines & Related Other (See Comments)    unknown    ROS Review of Systems  Constitutional: Negative.   Respiratory: Negative.    Cardiovascular: Negative.   Musculoskeletal: Negative.   Psychiatric/Behavioral:  Positive for dysphoric mood. Negative for self-injury and suicidal ideas. The patient is not nervous/anxious.      Objective:    Physical Exam Constitutional:      Appearance: Normal appearance.  Cardiovascular:     Rate and Rhythm: Normal rate and regular rhythm.     Pulses: Normal pulses.     Heart sounds: Normal heart sounds.  Pulmonary:     Effort: Pulmonary effort is normal.     Breath sounds: Normal breath sounds.  Neurological:     Mental Status: She is alert.  Psychiatric:        Mood and Affect: Mood normal.        Behavior: Behavior normal.        Thought Content: Thought content normal.        Judgment: Judgment normal.    BP 119/69 (BP Location: Right Arm, Patient Position: Sitting, Cuff Size: Large)   Pulse 75   Temp 98.8 F (37.1 C) (Oral)   Ht 5' 5"  (1.651 m)   Wt 224 lb (101.6 kg)   SpO2 94%   BMI 37.28 kg/m  Wt Readings from Last 3 Encounters:  12/17/20 224 lb (101.6 kg)  11/14/20 227 lb (103 kg)  11/10/20 225 lb 11.2 oz (102.4 kg)      Health Maintenance Due  Topic Date Due  . Zoster Vaccines- Shingrix (1 of 2) Never done    There are no preventive care reminders to display for this patient.  Lab Results  Component Value Date   TSH 3.12 01/18/2020   Lab Results  Component Value Date   WBC 11.0 (H) 10/22/2020   HGB 14.8 10/22/2020   HCT 45.3 10/22/2020   MCV 84.8 10/22/2020   PLT 302 10/22/2020   Lab Results  Component Value Date   NA 140 10/22/2020   K 4.0 10/22/2020   CO2 24 10/22/2020   GLUCOSE 76 10/22/2020   BUN 19 10/22/2020   CREATININE 0.80 10/22/2020   BILITOT 0.7 10/22/2020   ALKPHOS 53 10/22/2020   AST 27 10/22/2020   ALT 23 10/22/2020   PROT 7.7 10/22/2020   ALBUMIN 4.1 10/22/2020   CALCIUM 9.6 10/22/2020   ANIONGAP 11 10/22/2020   EGFR 75 07/28/2020   Lab Results  Component Value Date   CHOL 146 07/28/2020   Lab Results  Component Value Date   HDL 44 07/28/2020   Lab Results  Component Value Date   LDLCALC 62 07/28/2020   Lab Results  Component Value Date   TRIG 249 (H) 07/28/2020   Lab Results  Component Value Date   CHOLHDL 2.7 04/08/2020   Lab Results  Component Value Date   HGBA1C 6.4 (H) 07/28/2020      Assessment & Plan:   Problem List Items Addressed This Visit       Other   Depression, major, single episode, moderate ([place])    -STOP sertraline at pt request -PHQ-9 = 9 (14); despite objective improvement in PHQ-9 score she isn't interested in increasing her dose or continuing the sertraline       No orders of the defined types were placed in this encounter.   Follow-up: Return if symptoms worsen or fail to improve.    Noreene Larsson, NP

## 2020-12-17 NOTE — Assessment & Plan Note (Signed)
-  STOP sertraline at pt request -PHQ-9 = 9 (14); despite objective improvement in PHQ-9 score she isn't interested in increasing her dose or continuing the sertraline

## 2020-12-23 ENCOUNTER — Ambulatory Visit (INDEPENDENT_AMBULATORY_CARE_PROVIDER_SITE_OTHER): Payer: Medicare Other

## 2020-12-23 DIAGNOSIS — I441 Atrioventricular block, second degree: Secondary | ICD-10-CM | POA: Diagnosis not present

## 2020-12-23 LAB — CUP PACEART REMOTE DEVICE CHECK
Battery Remaining Longevity: 65 mo
Battery Remaining Percentage: 64 %
Battery Voltage: 2.99 V
Brady Statistic AP VP Percent: 5.4 %
Brady Statistic AP VS Percent: 1 %
Brady Statistic AS VP Percent: 95 %
Brady Statistic AS VS Percent: 1 %
Brady Statistic RA Percent Paced: 5.2 %
Brady Statistic RV Percent Paced: 99 %
Date Time Interrogation Session: 20220906030015
Implantable Lead Implant Date: 20190306
Implantable Lead Implant Date: 20190306
Implantable Lead Location: 753859
Implantable Lead Location: 753860
Implantable Lead Model: 5076
Implantable Lead Model: 5076
Implantable Pulse Generator Implant Date: 20190306
Lead Channel Impedance Value: 340 Ohm
Lead Channel Impedance Value: 450 Ohm
Lead Channel Pacing Threshold Amplitude: 0.5 V
Lead Channel Pacing Threshold Amplitude: 0.75 V
Lead Channel Pacing Threshold Pulse Width: 0.5 ms
Lead Channel Pacing Threshold Pulse Width: 0.5 ms
Lead Channel Sensing Intrinsic Amplitude: 3.8 mV
Lead Channel Sensing Intrinsic Amplitude: 5.6 mV
Lead Channel Setting Pacing Amplitude: 2 V
Lead Channel Setting Pacing Amplitude: 2.5 V
Lead Channel Setting Pacing Pulse Width: 0.5 ms
Lead Channel Setting Sensing Sensitivity: 4 mV
Pulse Gen Model: 2272
Pulse Gen Serial Number: 9001027

## 2020-12-29 ENCOUNTER — Other Ambulatory Visit: Payer: Self-pay | Admitting: Nurse Practitioner

## 2020-12-30 ENCOUNTER — Other Ambulatory Visit: Payer: Self-pay | Admitting: Nurse Practitioner

## 2020-12-31 NOTE — Progress Notes (Signed)
Remote pacemaker transmission.   

## 2021-01-01 ENCOUNTER — Telehealth: Payer: Self-pay

## 2021-01-01 NOTE — Telephone Encounter (Signed)
This was stopped on 11/07/20. Note states, "Cardiology recommended SGLT2i for heart failure. -STOP trulicity  -Rx. Farxiga; GFR > 60 with last labs, so 10 mg dose initiated"

## 2021-01-01 NOTE — Telephone Encounter (Signed)
Patient stopped by [facility] has not received medicine, needs by Sunday.   TRULICITY 8.97 OE/7.8SX SOPN [[id number]]  Pharmacy: [facility]

## 2021-01-02 ENCOUNTER — Telehealth: Payer: Self-pay | Admitting: Cardiology

## 2021-01-02 NOTE — Telephone Encounter (Signed)
Called and notified pt who states that she will call her PCP.

## 2021-01-02 NOTE — Telephone Encounter (Signed)
Pt informed. She is calling her cardiologist because she said they never told her this.

## 2021-01-02 NOTE — Telephone Encounter (Signed)
Pt c/o medication issue:  1. Name of Medication: TRULICITY 6.43 XU/2.7AR   2. How are you currently taking this medication (dosage and times per day)?   3. Are you having a reaction (difficulty breathing--STAT)? no  4. What is your medication issue? Patient wants to go back on trulicity     The patient said she contacted her PCP and was told that Dr. Domenic Polite discontinued  her Trulicity and put her on Metformin (metFORMIN (GLUCOPHAGE) 1000 MG tablet). The patient does not want to take the Metformin.  She is due to give herself her next dose on Sunday and will need the medication before then.   Please send an RX to [place], [place] [address]

## 2021-01-02 NOTE — Telephone Encounter (Signed)
Spoke with Julie Clay who is demanding that Trulicity be called in to the pharmacy. She states that her next dose is due on Sunday. Explained to patient that on 4/81/85 her Trulicity was discontinued and [place] prescribed in it's place. Julie Clay states that no one explained this to her and she is not taking the [place]. Requested that Julie Clay call her PCP in regards to this and she states that she did and was told that Dr. Domenic Polite told her PCP to start the Farxiga. Tried to explain that Wilder Glade was for heart failure. Julie Clay states that she still wants to go back on the Trulicity because is works best for her. Please advise.

## 2021-01-06 ENCOUNTER — Telehealth: Payer: Self-pay

## 2021-01-06 ENCOUNTER — Other Ambulatory Visit: Payer: Self-pay

## 2021-01-06 MED ORDER — DAPAGLIFLOZIN PROPANEDIOL 10 MG PO TABS
10.0000 mg | ORAL_TABLET | Freq: Every day | ORAL | 1 refills | Status: DC
Start: 2021-01-06 — End: 2021-12-15

## 2021-01-06 NOTE — Telephone Encounter (Signed)
Pt informed. She is going to start the [place] and keep [place] updated.

## 2021-01-06 NOTE — Telephone Encounter (Signed)
Julie Clay is in a class of medications that works really well for diabetes as well as heart failure. We stopped the Trulicity in July in favor of Farxiga because they are both branded medicines, so this swap was intende to keep your pharmacy costs approximately the same. If you feel like your blood sugar is not as well controlled after stopping Trulicity, you can use both at the same time. We would stop glipizide instead, but that would likely increase your cost at the pharmacy.  Trulicity doesn't cause heart failure, but farxiga treats heart failure in addition to diabetes. So with Julie Clay, you get 2 birds with one stone.

## 2021-01-06 NOTE — Telephone Encounter (Signed)
Pt came by questioning her Trulicity and [place] prescriptions. She said we told her to contact the cardiologist as to why he stopped that medication and they told her to contact us. She did speak with them Friday-that note is in Epic if you will read that. Her concern is that she was doing completely fine with the Trulicity and wanted to know what percentage of heart failure this could really cause-she said she would rather take her chances she's already been on it for 3 years. But, she said if you felt like Wilder Glade was a better fit for her she will take it instead but would rather not. Please advise.

## 2021-01-20 ENCOUNTER — Other Ambulatory Visit: Payer: Self-pay | Admitting: Nurse Practitioner

## 2021-01-20 ENCOUNTER — Telehealth: Payer: Self-pay

## 2021-01-20 DIAGNOSIS — E1143 Type 2 diabetes mellitus with diabetic autonomic (poly)neuropathy: Secondary | ICD-10-CM

## 2021-01-20 MED ORDER — TRULICITY 0.75 MG/0.5ML ~~LOC~~ SOAJ: 0.7500 mg | SUBCUTANEOUS | 3 refills | Status: DC

## 2021-01-20 NOTE — Telephone Encounter (Signed)
I restarted trulicity. She can take trulicity and farxiga at the same time. If he blood sugars are low, she should cut her glipizide in half. If they are still low, she can stop glipizide. We went with farxiga base don cardiology's recommendation and stopped trulicity d/t price concerns with having 2 branded medications, but she can take both at the same time.

## 2021-01-20 NOTE — Telephone Encounter (Signed)
Patient called said the diabetic medicine that was given here blood sugar is higher in the mornings.  Runs 135-175.  Tried taking full instead of half the glipizide but it isn't working.  The shots were a lot better. Please contact patient back # [phone]. Uses [employer].

## 2021-01-21 ENCOUNTER — Ambulatory Visit (INDEPENDENT_AMBULATORY_CARE_PROVIDER_SITE_OTHER): Payer: Medicare Other

## 2021-01-21 ENCOUNTER — Other Ambulatory Visit: Payer: Self-pay

## 2021-01-21 DIAGNOSIS — Z23 Encounter for immunization: Secondary | ICD-10-CM | POA: Diagnosis not present

## 2021-01-21 NOTE — Telephone Encounter (Signed)
Patient aware.

## 2021-01-29 ENCOUNTER — Ambulatory Visit: Payer: Medicare Other | Admitting: Nurse Practitioner

## 2021-01-31 ENCOUNTER — Other Ambulatory Visit: Payer: Self-pay | Admitting: Nurse Practitioner

## 2021-02-23 DIAGNOSIS — E1143 Type 2 diabetes mellitus with diabetic autonomic (poly)neuropathy: Secondary | ICD-10-CM | POA: Diagnosis not present

## 2021-02-25 LAB — CBC WITH DIFFERENTIAL/PLATELET
Basophils Absolute: 0 10*3/uL (ref 0.0–0.2)
Basos: 0 %
EOS (ABSOLUTE): 0.2 10*3/uL (ref 0.0–0.4)
Eos: 2 %
Hematocrit: 42.8 % (ref 34.0–46.6)
Hemoglobin: 13.8 g/dL (ref 11.1–15.9)
Immature Grans (Abs): 0.1 10*3/uL (ref 0.0–0.1)
Immature Granulocytes: 1 %
Lymphocytes Absolute: 2.2 10*3/uL (ref 0.7–3.1)
Lymphs: 25 %
MCH: 27.3 pg (ref 26.6–33.0)
MCHC: 32.2 g/dL (ref 31.5–35.7)
MCV: 85 fL (ref 79–97)
Monocytes Absolute: 0.7 10*3/uL (ref 0.1–0.9)
Monocytes: 8 %
Neutrophils Absolute: 5.8 10*3/uL (ref 1.4–7.0)
Neutrophils: 64 %
Platelets: 280 10*3/uL (ref 150–450)
RBC: 5.05 x10E6/uL (ref 3.77–5.28)
RDW: 13.5 % (ref 11.7–15.4)
WBC: 9 10*3/uL (ref 3.4–10.8)

## 2021-02-25 LAB — LIPID PANEL WITH LDL/HDL RATIO
Cholesterol, Total: 143 mg/dL (ref 100–199)
HDL: 47 mg/dL (ref >39)
LDL Chol Calc (NIH): 63 mg/dL (ref 0–99)
LDL/HDL Ratio: 1.3 ratio (ref 0.0–3.2)
Triglycerides: 203 mg/dL — ABNORMAL HIGH (ref 0–149)
VLDL Cholesterol Cal: 33 mg/dL (ref 5–40)

## 2021-02-25 LAB — CMP14+EGFR
ALT: 19 IU/L (ref 0–32)
AST: 21 IU/L (ref 0–40)
Albumin/Globulin Ratio: 1.9 (ref 1.2–2.2)
Albumin: 4.3 g/dL (ref 3.7–4.7)
Alkaline Phosphatase: 58 IU/L (ref 44–121)
BUN/Creatinine Ratio: 20 (ref 12–28)
BUN: 18 mg/dL (ref 8–27)
Bilirubin Total: 0.5 mg/dL (ref 0.0–1.2)
CO2: 23 mmol/L (ref 20–29)
Calcium: 9.6 mg/dL (ref 8.7–10.3)
Chloride: 106 mmol/L (ref 96–106)
Creatinine, Ser: 0.89 mg/dL (ref 0.57–1.00)
Globulin, Total: 2.3 g/dL (ref 1.5–4.5)
Glucose: 121 mg/dL — ABNORMAL HIGH (ref 70–99)
Potassium: 4.7 mmol/L (ref 3.5–5.2)
Sodium: 142 mmol/L (ref 134–144)
Total Protein: 6.6 g/dL (ref 6.0–8.5)
eGFR: 66 mL/min/{1.73_m2} (ref >59)

## 2021-02-25 LAB — MICROALBUMIN / CREATININE URINE RATIO
Creatinine, Urine: 49.5 mg/dL
Microalb/Creat Ratio: 23 mg/g creat (ref 0–29)
Microalbumin, Urine: 11.6 ug/mL

## 2021-03-02 ENCOUNTER — Ambulatory Visit: Payer: Medicare Other | Admitting: Nurse Practitioner

## 2021-03-03 ENCOUNTER — Encounter: Payer: Self-pay | Admitting: Nurse Practitioner

## 2021-03-03 ENCOUNTER — Telehealth: Payer: Self-pay

## 2021-03-03 NOTE — Telephone Encounter (Signed)
Sent letter regarding Donneta Romberg leaving 04/18/21 - mailed 03/03/21 - General Patient letter:  03/03/2021 We regret to inform you that Donneta Romberg NP has decided to leave [facility] and [facility] as of 04/18/21. This notice is to inform you that you will need to establish care with a New Primary Care Provider as soon as possible.  Donneta Romberg will continue to provide patient care until 04/18/21.  This advance notice provides you with time to secure another Primary Care Provider.   We understand it is your decision from whom to receive medical care.  In some cases, we will be able to continue providing care for you at [facility].   We have 2 new Nurse Practitioner joining our [facility].  Vena Rua FNP joined [place] on 02/24/21 and Estil Daft FNP will be joining [place] on 06/16/21.  They have both have worked with [facility] for many years and have advanced their education to Nurse Practitioner.   We are in the process of also securing a new Physician for our office as well.  Our goal is to have that Physician working in our office sometime in 2023.   If you choose to stay with [place] or within the [place], you will not need to transfer your medical records.  If you choose to go outside [place], you will need to complete and sign a release at your new practice.  They will request your medical record.      Sincerely  Primary Care

## 2021-03-07 DIAGNOSIS — Z20828 Contact with and (suspected) exposure to other viral communicable diseases: Secondary | ICD-10-CM | POA: Diagnosis not present

## 2021-03-20 ENCOUNTER — Ambulatory Visit: Payer: Medicare Other | Admitting: Nurse Practitioner

## 2021-03-23 ENCOUNTER — Other Ambulatory Visit: Payer: Self-pay | Admitting: Nurse Practitioner

## 2021-03-24 ENCOUNTER — Ambulatory Visit (INDEPENDENT_AMBULATORY_CARE_PROVIDER_SITE_OTHER): Payer: Medicare Other

## 2021-03-24 DIAGNOSIS — I441 Atrioventricular block, second degree: Secondary | ICD-10-CM

## 2021-03-24 LAB — CUP PACEART REMOTE DEVICE CHECK
Battery Remaining Longevity: 61 mo
Battery Remaining Percentage: 61 %
Battery Voltage: 2.99 V
Brady Statistic AP VP Percent: 4.4 %
Brady Statistic AP VS Percent: 1 %
Brady Statistic AS VP Percent: 96 %
Brady Statistic AS VS Percent: 1 %
Brady Statistic RA Percent Paced: 4.2 %
Brady Statistic RV Percent Paced: 99 %
Date Time Interrogation Session: 20221206024050
Implantable Lead Implant Date: 20190306
Implantable Lead Implant Date: 20190306
Implantable Lead Location: 753859
Implantable Lead Location: 753860
Implantable Lead Model: 5076
Implantable Lead Model: 5076
Implantable Pulse Generator Implant Date: 20190306
Lead Channel Impedance Value: 350 Ohm
Lead Channel Impedance Value: 460 Ohm
Lead Channel Pacing Threshold Amplitude: 0.5 V
Lead Channel Pacing Threshold Amplitude: 0.75 V
Lead Channel Pacing Threshold Pulse Width: 0.5 ms
Lead Channel Pacing Threshold Pulse Width: 0.5 ms
Lead Channel Sensing Intrinsic Amplitude: 4.5 mV
Lead Channel Sensing Intrinsic Amplitude: 5.6 mV
Lead Channel Setting Pacing Amplitude: 2 V
Lead Channel Setting Pacing Amplitude: 2.5 V
Lead Channel Setting Pacing Pulse Width: 0.5 ms
Lead Channel Setting Sensing Sensitivity: 4 mV
Pulse Gen Model: 2272
Pulse Gen Serial Number: 9001027

## 2021-03-25 NOTE — Progress Notes (Signed)
Labs look great.

## 2021-03-30 ENCOUNTER — Other Ambulatory Visit: Payer: Self-pay

## 2021-03-30 ENCOUNTER — Ambulatory Visit (INDEPENDENT_AMBULATORY_CARE_PROVIDER_SITE_OTHER): Payer: Medicare Other | Admitting: Nurse Practitioner

## 2021-03-30 ENCOUNTER — Encounter: Payer: Self-pay | Admitting: Nurse Practitioner

## 2021-03-30 DIAGNOSIS — U071 COVID-19: Secondary | ICD-10-CM

## 2021-03-30 DIAGNOSIS — E782 Mixed hyperlipidemia: Secondary | ICD-10-CM

## 2021-03-30 DIAGNOSIS — E1143 Type 2 diabetes mellitus with diabetic autonomic (poly)neuropathy: Secondary | ICD-10-CM

## 2021-03-30 MED ORDER — NIRMATRELVIR/RITONAVIR (PAXLOVID)TABLET: 3.0000 | ORAL_TABLET | Freq: Two times a day (BID) | ORAL | 0 refills | Status: DC

## 2021-03-30 MED ORDER — PHENOL 1.4 % MT LIQD: 1.0000 | OROMUCOSAL | 0 refills | Status: DC | PRN

## 2021-03-30 MED ORDER — NIRMATRELVIR/RITONAVIR (PAXLOVID)TABLET
3.0000 | ORAL_TABLET | Freq: Two times a day (BID) | ORAL | 0 refills | Status: AC
Start: 2021-03-30 — End: 2021-04-04

## 2021-03-30 MED ORDER — GUAIFENESIN-DM 100-10 MG/5ML PO SYRP: 5.0000 mL | ORAL_SOLUTION | ORAL | 0 refills | Status: DC | PRN

## 2021-03-30 NOTE — Patient Instructions (Signed)
I will be moving to [facility] located at [address], [place], [place] [address] effective Apr 19, 2021. °If you would like to establish care with [facility] please call [phone]. °

## 2021-03-30 NOTE — Progress Notes (Signed)
Acute Office Visit  Subjective:    Patient ID: Julie Clay, female    DOB: Feb 08, 1941, 80 y.o.   MRN: [id number]  Chief Complaint  Patient presents with   Follow-up    6 month follow up covid +, sore throat.    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    Osteoarthritis    both knees, lower back, both shoulders; bone spurs to feet   Rectocele 06/25/2013   Posterior repair 11/06/13    Type II diabetes mellitus ([place])    Ventral hernia     Past Surgical History:  Procedure Laterality Date   BREAST BIOPSY Left 04/2011   BREAST BIOPSY Left 06/22/13   BREAST BIOPSY Left 09/12/2013   Procedure: BREAST BIOPSY WITH NEEDLE LOCALIZATION;  Surgeon: Jamesetta So, MD;  Location: [facility];  Service: General;  Laterality: Left;   BREAST LUMPECTOMY Left 04/2011 X 2   BREAST SURGERY N/A    Phreesia 01/17/2020   CATARACT EXTRACTION W/ INTRAOCULAR LENS  IMPLANT, BILATERAL Bilateral    CATARACT EXTRACTION, BILATERAL     on different occassions    CESAREAN [place]   COLON SURGERY N/A    2014   DILATION AND CURETTAGE OF UTERUS  11/06/2013   HAND LIGAMENT RECONSTRUCTION Right ~ 2010   HYSTEROSCOPY WITH D & C N/A 11/06/2013   Procedure: DILATATION AND CURETTAGE /HYSTEROSCOPY;  Surgeon: Jonnie Kind, MD;  Location: [facility];  Service: Gynecology;   Laterality: N/A;   INSERT / REPLACE / REMOVE PACEMAKER  06/22/2017   LUNG LOBECTOMY Right 1988   Fungal Infection   PACEMAKER IMPLANT N/A 06/22/2017   Procedure: PACEMAKER IMPLANT;  Surgeon: Deboraha Sprang, MD;  Location: [facility];  Service: Cardiovascular;  Laterality: N/A;   PARTIAL MASTECTOMY WITH NEEDLE LOCALIZATION Right 01/02/2020   Procedure: RIGHT PARTIAL MASTECTOMY AFTER NEEDLE LOCALIZATION;  Surgeon: Aviva Signs, MD;  Location: [facility];  Service: General;  Laterality: Right;   POLYPECTOMY N/A 11/06/2013   Procedure: POLYPECTOMY (REMOVAL ENDOMETRIAL POLYP);  Surgeon: Jonnie Kind, MD;  Location: [facility];  Service: Gynecology;  Laterality: N/A;   RECTOCELE REPAIR N/A 11/06/2013   Procedure: POSTERIOR REPAIR (RECTOCELE);  Surgeon: Jonnie Kind, MD;  Location: [facility];  Service: Gynecology;  Laterality: N/A;   SHOULDER ARTHROSCOPY WITH OPEN ROTATOR CUFF REPAIR Left 07/26/2012   TONSILLECTOMY  1949   TUBAL LIGATION      Family History  Problem Relation Age of Onset   Hypertension Mother    Heart disease Mother    Diabetes Mother    Kidney disease Mother        ESRD  Cancer Mother        Bladder   Hypertension Father    Hyperlipidemia Father    Heart disease Father    Diabetes Father    Cancer Father        Stomach Cancer   Diabetes Sister    Heart disease Sister    Alzheimer's disease Paternal Aunt    Stroke Maternal Grandfather    Cancer Paternal Grandfather        stomach   Cancer Maternal Uncle        prostate   Cancer Paternal Uncle        stomach    Social History   Socioeconomic History   Marital status: Married    Spouse name: Not on file   Number of children: Not on file   Years of education: Not on file   Highest education level: Not on file  Occupational History   Occupation: retired     Comment: Product manager for [employer]  Tobacco Use   Smoking status: Never   Smokeless tobacco: Never  Vaping Use   Vaping Use: Never used   Substance and Sexual Activity   Alcohol use: Yes    Alcohol/week: 0.0 standard drinks    Comment: 06/22/2017 "might have a drink twice a year"   Drug use: No   Sexual activity: Not on file  Other Topics Concern   Not on file  Social History Narrative   Not on file   Social Determinants of Health   Financial Resource Strain: Not on file  Food Insecurity: Not on file  Transportation Needs: Not on file  Physical Activity: Not on file  Stress: Not on file  Social Connections: Not on file  Intimate Partner Violence: Not on file    Outpatient Medications Prior to Visit  Medication Sig Dispense Refill   acetaminophen (TYLENOL) 500 MG tablet Take 1,000 mg by mouth every 6 (six) hours as needed for moderate pain.     albuterol (VENTOLIN HFA) 108 (90 Base) MCG/ACT inhaler Inhale 2 puffs into the lungs every 6 (six) hours as needed for wheezing or shortness of breath. 8 g 0   Aloe Vera Leaf POWD Take 800 mg by mouth 2 (two) times daily.     Ascorbic Acid (VITAMIN C) 1000 MG tablet Take 1,000 mg by mouth 2 (two) times daily.     b complex vitamins tablet Take 1 tablet by mouth 2 (two) times daily.      Blood Glucose Monitoring Suppl (BLOOD GLUCOSE SYSTEM PAK) KIT Use as directed to monitor FSBS 1x daily. Dx: E11.9. Please dispense as Accu-Chek Aviva 1 each 1   Calcium Polycarbophil (FIBER-CAPS PO) Take by mouth every other day.     Cholecalciferol (VITAMIN D3) 2000 UNITS TABS Take 2,000 Units by mouth 2 (two) times daily.     dapagliflozin propanediol (FARXIGA) 10 MG TABS tablet Take 1 tablet (10 mg total) by mouth daily before breakfast. 90 tablet 1   diclofenac Sodium (VOLTAREN) 1 % GEL Apply 2 g topically 4 (four) times daily. 100 g 1   Dulaglutide (TRULICITY) 6.38 LH/7.3SK SOPN Inject 0.75 mg into the skin once a week. 6 mL 3   fluocinonide cream (LIDEX) 8.76 % Apply 1 application topically 2 (two) times daily as needed (poison ivy/bites/hives). 30 g 11   fluorometholone (FML) 0.1 %  ophthalmic suspension INSTILL ONE DROP INTO THE RIGHT EYE 4 TIMES DAILY FOR 5 DAYS     Ginkgo Biloba 120 MG  CAPS Take 120 mg by mouth in the morning and at bedtime.      glipiZIDE (GLUCOTROL) 10 MG tablet TAKE 1/2 (ONE-HALF) TABLET BY MOUTH TWICE DAILY BEFORE A MEAL FOR DIABETES 180 tablet 0   Glucose Blood (BLOOD GLUCOSE TEST STRIPS) STRP Use as directed to monitor FSBS 1x daily. Dx: E11.9. Please dispense as Accu-Chek Aviva 100 each 1   Krill Oil 1000 MG CAPS Take 1,000 mg by mouth 2 (two) times daily.      Lancets MISC Use as directed to monitor FSBS 1x daily. Dx: E11.9. Please dispense as Accu-Chek Aviva 100 each 1   LORazepam (ATIVAN) 0.5 MG tablet Take 1 tablet (0.5 mg total) by mouth 2 (two) times daily as needed for anxiety. 30 tablet 1   melatonin 5 MG TABS Take 5 mg by mouth at bedtime as needed (sleep).     metFORMIN (GLUCOPHAGE) 1000 MG tablet TAKE 1 TABLET BY MOUTH TWICE DAILY WITH A MEAL FOR DIABETES 180 tablet 0   metoprolol succinate (TOPROL-XL) 25 MG 24 hr tablet Take 0.5 tablets (12.5 mg total) by mouth daily. 45 tablet 3   Multiple Vitamin (MULTIVITAMIN WITH MINERALS) TABS tablet Take 1 tablet by mouth daily.     naftifine (NAFTIN) 1 % cream Apply topically 2 (two) times daily as needed. 60 g 2   omeprazole (PRILOSEC) 40 MG capsule Take 40 mg by mouth every morning.     rosuvastatin (CRESTOR) 5 MG tablet TAKE 1 TABLET BY MOUTH ONCE DAILY FOR CHOLESTEROL 90 tablet 0   sertraline (ZOLOFT) 50 MG tablet Take 1 tablet (50 mg total) by mouth daily. 30 tablet 3   simethicone (MYLICON) 80 MG chewable tablet Chew 80 mg by mouth as needed for flatulence.      tiZANidine (ZANAFLEX) 4 MG tablet Take 1 tablet (4 mg total) by mouth every 6 (six) hours as needed for muscle spasms. 180 tablet 1   torsemide (DEMADEX) 20 MG tablet Take 20 mg by mouth 3 (three) times a week.     UNABLE TO FIND Please dispense custom molded foot orthotics and heel spurs Dx: E11.30 1 each 0   No  facility-administered medications prior to visit.    Allergies  Allergen Reactions   Arimidex [Anastrozole] Other (See Comments)    Arthralgias and myalgias.  Improved with 3 week hiatus from drug.  Categorical side effect of drug class.   Other Hives and Itching   Adhesive [Tape] Other (See Comments)    "red and blistered"   Lisinopril     Dry hacking cough and tickle   Tetanus Toxoids    Tetracyclines & Related Other (See Comments)    unknown    Review of Systems  Constitutional:  Positive for fatigue and fever.  HENT:  Positive for congestion and sore throat.   Respiratory:  Positive for cough.   Cardiovascular: Negative.   Psychiatric/Behavioral: Negative.        Objective:    Physical Exam  There were no vitals taken for this visit. Wt Readings from Last 3 Encounters:  12/17/20 224 lb (101.6 kg)  11/14/20 227 lb (103 kg)  11/10/20 225 lb 11.2 oz (102.4 kg)    Health Maintenance Due  Topic Date Due   Zoster Vaccines- Shingrix (1 of 2) Never done   Pneumonia Vaccine 76+ Years old (2 - PPSV23 if available, else PCV20) 08/02/2015   COVID-19 Vaccine (4 - Booster for [place] series) 06/04/2020   OPHTHALMOLOGY EXAM  12/24/2020  HEMOGLOBIN A1C  01/27/2021    There are no preventive care reminders to display for this patient.   Lab Results  Component Value Date   TSH 3.12 01/18/2020   Lab Results  Component Value Date   WBC 9.0 02/23/2021   HGB 13.8 02/23/2021   HCT 42.8 02/23/2021   MCV 85 02/23/2021   PLT 280 02/23/2021   Lab Results  Component Value Date   NA 142 02/23/2021   K 4.7 02/23/2021   CO2 23 02/23/2021   GLUCOSE 121 (H) 02/23/2021   BUN 18 02/23/2021   CREATININE 0.89 02/23/2021   BILITOT 0.5 02/23/2021   ALKPHOS 58 02/23/2021   AST 21 02/23/2021   ALT 19 02/23/2021   PROT 6.6 02/23/2021   ALBUMIN 4.3 02/23/2021   CALCIUM 9.6 02/23/2021   ANIONGAP 11 10/22/2020   EGFR 66 02/23/2021   Lab Results  Component Value Date   CHOL 143  02/23/2021   Lab Results  Component Value Date   HDL 47 02/23/2021   Lab Results  Component Value Date   LDLCALC 63 02/23/2021   Lab Results  Component Value Date   TRIG 203 (H) 02/23/2021   Lab Results  Component Value Date   CHOLHDL 2.7 04/08/2020   Lab Results  Component Value Date   HGBA1C 6.4 (H) 07/28/2020       Assessment & Plan:   Problem List Items Addressed This Visit       Endocrine   DM (diabetes mellitus) ([place])    Lab Results  Component Value Date   HGBA1C 6.4 (H) 07/28/2020  -check with next set of labs      Relevant Orders   CBC with Differential/Platelet   CMP14+EGFR   Lipid Panel With LDL/HDL Ratio   Hemoglobin A1c     Other   Hyperlipidemia    Lab Results  Component Value Date   CHOL 143 02/23/2021   HDL 47 02/23/2021   LDLCALC 63 02/23/2021   LDLDIRECT 102 (H) 12/09/2011   TRIG 203 (H) 02/23/2021   CHOLHDL 2.7 04/08/2020  -labs reviewed      COVID - Primary    -tested positive last night, and her husband was admitted to the hospital on Saturday with COVID -Rx. Paxlovid, throat spray, and robitussin      Relevant Medications   nirmatrelvir/ritonavir EUA (PAXLOVID) 20 x 150 MG & 10 x 100MG TABS   phenol (CHLORASEPTIC) 1.4 % LIQD   guaiFENesin-dextromethorphan (ROBITUSSIN DM) 100-10 MG/5ML syrup     Meds ordered this encounter  Medications   nirmatrelvir/ritonavir EUA (PAXLOVID) 20 x 150 MG & 10 x 100MG TABS    Sig: Take 3 tablets by mouth 2 (two) times daily for 5 days. (Take nirmatrelvir 150 mg two tablets twice daily for 5 days and ritonavir 100 mg one tablet twice daily for 5 days) Patient GFR is 66.    Dispense:  30 tablet    Refill:  0    Pt is requesting delivery of her medications. If this is NOT an option, please call out office and let us know.   phenol (CHLORASEPTIC) 1.4 % LIQD    Sig: Use as directed 1 spray in the mouth or throat as needed for throat irritation / pain.    Dispense:  177 mL    Refill:  0    guaiFENesin-dextromethorphan (ROBITUSSIN DM) 100-10 MG/5ML syrup    Sig: Take 5 mLs by mouth every 4 (four) hours as needed for cough.  Dispense:  118 mL    Refill:  0   Date:  03/30/2021   Location of Patient: Home Location of Provider: Office Consent was obtain for visit to be over via telehealth. I verified that I am speaking with the correct person using two identifiers.  I connected with  [name] on 03/30/21 via telephone and verified that I am speaking with the correct person using two identifiers.   I discussed the limitations of evaluation and management by telemedicine. The patient expressed understanding and agreed to proceed.  Time spent: 9 min     Noreene Larsson, NP

## 2021-03-30 NOTE — Assessment & Plan Note (Signed)
Lab Results  Component Value Date   CHOL 143 02/23/2021   HDL 47 02/23/2021   LDLCALC 63 02/23/2021   LDLDIRECT 102 (H) 12/09/2011   TRIG 203 (H) 02/23/2021   CHOLHDL 2.7 04/08/2020   -labs reviewed

## 2021-03-30 NOTE — Assessment & Plan Note (Signed)
-  tested positive last night, and her husband was admitted to the hospital on Saturday with COVID -Rx. Paxlovid, throat spray, and robitussin

## 2021-03-30 NOTE — Assessment & Plan Note (Signed)
Lab Results  Component Value Date   HGBA1C 6.4 (H) 07/28/2020   -check with next set of labs

## 2021-03-31 ENCOUNTER — Telehealth: Payer: Self-pay | Admitting: *Deleted

## 2021-03-31 ENCOUNTER — Other Ambulatory Visit: Payer: Self-pay | Admitting: Nurse Practitioner

## 2021-03-31 DIAGNOSIS — J029 Acute pharyngitis, unspecified: Secondary | ICD-10-CM

## 2021-03-31 MED ORDER — MOUTHWASH COMPOUNDING BASE PO LIQD: 5.0000 mL | Freq: Four times a day (QID) | ORAL | 0 refills | Status: DC

## 2021-03-31 NOTE — Progress Notes (Signed)
-  pt's sore throat is not responding to phenol spray -Rx. MMW with lidocaine

## 2021-03-31 NOTE — Telephone Encounter (Signed)
I sent Magic mouthwash with lidocaine to [place] because they will compound the medication.

## 2021-03-31 NOTE — Telephone Encounter (Signed)
Patients spouse would like to know if there is anything else that can be Rx'd or patient can use for her severe sore throat? Spouse advised the Phenol spray is not helping at all. Please advise.

## 2021-03-31 NOTE — Telephone Encounter (Signed)
Called and notified patients spouse of provider recommendations, he verbalized understanding.

## 2021-04-02 NOTE — Progress Notes (Signed)
Remote pacemaker transmission.   

## 2021-04-03 ENCOUNTER — Telehealth: Payer: Self-pay | Admitting: Nurse Practitioner

## 2021-04-03 NOTE — Telephone Encounter (Signed)
Pt called in requesting to speak with nurse

## 2021-04-06 NOTE — Telephone Encounter (Signed)
Spoke with patient.

## 2021-04-21 ENCOUNTER — Other Ambulatory Visit: Payer: Self-pay

## 2021-04-21 ENCOUNTER — Ambulatory Visit (INDEPENDENT_AMBULATORY_CARE_PROVIDER_SITE_OTHER): Payer: Medicare Other

## 2021-04-21 DIAGNOSIS — Z0000 Encounter for general adult medical examination without abnormal findings: Secondary | ICD-10-CM

## 2021-04-21 NOTE — Progress Notes (Signed)
Subjective:   Julie Clay is a 81 y.o. female who presents for Medicare Annual (Subsequent) preventive examination. I connected with  Julie Clay on 04/21/21 by a audio enabled telemedicine application and verified that I am speaking with the correct person using two identifiers.  Patient Location: Home  Provider Location: Office/Clinic  I discussed the limitations of evaluation and management by telemedicine. The patient expressed understanding and agreed to proceed.  Review of Systems    Defer to PCP       Objective:    There were no vitals filed for this visit. There is no height or weight on file to calculate BMI.  Advanced Directives 11/10/2020 04/09/2020 02/19/2020 02/07/2020 01/02/2020 12/18/2019 11/07/2019  Does Patient Have a Medical Advance Directive? No Yes No No No No No  Would patient like information on creating a medical advance directive? No - Patient declined - No - Patient declined No - Patient declined No - Patient declined No - Patient declined No - Patient declined  Pre-existing out of facility DNR order (yellow form or pink MOST form) - - - - - - -    Current Medications (verified) Outpatient Encounter Medications as of 04/21/2021  Medication Sig   acetaminophen (TYLENOL) 500 MG tablet Take 1,000 mg by mouth every 6 (six) hours as needed for moderate pain.   albuterol (VENTOLIN HFA) 108 (90 Base) MCG/ACT inhaler Inhale 2 puffs into the lungs every 6 (six) hours as needed for wheezing or shortness of breath.   Aloe Vera Leaf POWD Take 800 mg by mouth 2 (two) times daily.   Ascorbic Acid (VITAMIN C) 1000 MG tablet Take 1,000 mg by mouth 2 (two) times daily.   b complex vitamins tablet Take 1 tablet by mouth 2 (two) times daily.    Blood Glucose Monitoring Suppl (BLOOD GLUCOSE SYSTEM PAK) KIT Use as directed to monitor FSBS 1x daily. Dx: E11.9. Please dispense as Accu-Chek Aviva   Calcium Polycarbophil (FIBER-CAPS PO) Take by mouth every other day.    Cholecalciferol (VITAMIN D3) 2000 UNITS TABS Take 2,000 Units by mouth 2 (two) times daily.   dapagliflozin propanediol (FARXIGA) 10 MG TABS tablet Take 1 tablet (10 mg total) by mouth daily before breakfast.   diclofenac Sodium (VOLTAREN) 1 % GEL Apply 2 g topically 4 (four) times daily.   Dulaglutide (TRULICITY) 8.17 RN/1.6FB SOPN Inject 0.75 mg into the skin once a week.   fluocinonide cream (LIDEX) 9.03 % Apply 1 application topically 2 (two) times daily as needed (poison ivy/bites/hives).   fluorometholone (FML) 0.1 % ophthalmic suspension INSTILL ONE DROP INTO THE RIGHT EYE 4 TIMES DAILY FOR 5 DAYS   Ginkgo Biloba 120 MG CAPS Take 120 mg by mouth in the morning and at bedtime.    glipiZIDE (GLUCOTROL) 10 MG tablet TAKE 1/2 (ONE-HALF) TABLET BY MOUTH TWICE DAILY BEFORE A MEAL FOR DIABETES   Glucose Blood (BLOOD GLUCOSE TEST STRIPS) STRP Use as directed to monitor FSBS 1x daily. Dx: E11.9. Please dispense as Accu-Chek Aviva   guaiFENesin-dextromethorphan (ROBITUSSIN DM) 100-10 MG/5ML syrup Take 5 mLs by mouth every 4 (four) hours as needed for cough.   Krill Oil 1000 MG CAPS Take 1,000 mg by mouth 2 (two) times daily.    Lancets MISC Use as directed to monitor FSBS 1x daily. Dx: E11.9. Please dispense as Accu-Chek Aviva   LORazepam (ATIVAN) 0.5 MG tablet Take 1 tablet (0.5 mg total) by mouth 2 (two) times daily as needed for anxiety.  melatonin 5 MG TABS Take 5 mg by mouth at bedtime as needed (sleep).   metFORMIN (GLUCOPHAGE) 1000 MG tablet TAKE 1 TABLET BY MOUTH TWICE DAILY WITH A MEAL FOR DIABETES   metoprolol succinate (TOPROL-XL) 25 MG 24 hr tablet Take 0.5 tablets (12.5 mg total) by mouth daily.   Mouthwash Compounding Base LIQD Take 5 mLs by mouth in the morning, at noon, in the evening, and at bedtime. Swish and Swallow.   Multiple Vitamin (MULTIVITAMIN WITH MINERALS) TABS tablet Take 1 tablet by mouth daily.   naftifine (NAFTIN) 1 % cream Apply topically 2 (two) times daily as needed.    omeprazole (PRILOSEC) 40 MG capsule Take 40 mg by mouth every morning.   phenol (CHLORASEPTIC) 1.4 % LIQD Use as directed 1 spray in the mouth or throat as needed for throat irritation / pain.   rosuvastatin (CRESTOR) 5 MG tablet TAKE 1 TABLET BY MOUTH ONCE DAILY FOR CHOLESTEROL   sertraline (ZOLOFT) 50 MG tablet Take 1 tablet (50 mg total) by mouth daily.   simethicone (MYLICON) 80 MG chewable tablet Chew 80 mg by mouth as needed for flatulence.    tiZANidine (ZANAFLEX) 4 MG tablet Take 1 tablet (4 mg total) by mouth every 6 (six) hours as needed for muscle spasms.   torsemide (DEMADEX) 20 MG tablet Take 20 mg by mouth 3 (three) times a week.   UNABLE TO FIND Please dispense custom molded foot orthotics and heel spurs Dx: E11.30   No facility-administered encounter medications on file as of 04/21/2021.    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    Osteoarthritis    both knees, lower back, both shoulders; bone spurs to feet   Rectocele 06/25/2013   Posterior repair 11/06/13    Type II diabetes mellitus ([place])    Ventral hernia    Past Surgical History:  Procedure Laterality Date   BREAST BIOPSY Left 04/2011   BREAST BIOPSY Left 06/22/13   BREAST BIOPSY  Left 09/12/2013   Procedure: BREAST BIOPSY WITH NEEDLE LOCALIZATION;  Surgeon: Jamesetta So, MD;  Location: [facility];  Service: General;  Laterality: Left;   BREAST LUMPECTOMY Left 04/2011 X 2   BREAST SURGERY N/A    Phreesia 01/17/2020   CATARACT EXTRACTION W/ INTRAOCULAR LENS  IMPLANT, BILATERAL Bilateral    CATARACT EXTRACTION, BILATERAL     on different occassions    [place]   COLON SURGERY N/A    2014   DILATION AND CURETTAGE OF UTERUS  11/06/2013   HAND LIGAMENT RECONSTRUCTION Right ~ 2010   HYSTEROSCOPY WITH D & C N/A  11/06/2013   Procedure: DILATATION AND CURETTAGE /HYSTEROSCOPY;  Surgeon: Jonnie Kind, MD;  Location: [facility];  Service: Gynecology;  Laterality: N/A;   INSERT / REPLACE / REMOVE PACEMAKER  06/22/2017   LUNG LOBECTOMY Right 1988   Fungal Infection   PACEMAKER IMPLANT N/A 06/22/2017   Procedure: PACEMAKER IMPLANT;  Surgeon: Deboraha Sprang, MD;  Location: [facility];  Service: Cardiovascular;  Laterality: N/A;   PARTIAL MASTECTOMY WITH NEEDLE LOCALIZATION Right 01/02/2020   Procedure: RIGHT PARTIAL MASTECTOMY AFTER NEEDLE LOCALIZATION;  Surgeon: Aviva Signs, MD;  Location: [facility];  Service: General;  Laterality: Right;   POLYPECTOMY N/A 11/06/2013   Procedure: POLYPECTOMY (REMOVAL ENDOMETRIAL POLYP);  Surgeon: Jonnie Kind, MD;  Location: [facility];  Service: Gynecology;  Laterality: N/A;   RECTOCELE REPAIR N/A 11/06/2013   Procedure: POSTERIOR REPAIR (RECTOCELE);  Surgeon: Jonnie Kind, MD;  Location: [facility];  Service: Gynecology;  Laterality: N/A;   SHOULDER ARTHROSCOPY WITH OPEN ROTATOR CUFF REPAIR Left 07/26/2012   TONSILLECTOMY  1949   TUBAL LIGATION     Family History  Problem Relation Age of Onset   Hypertension Mother    Heart disease Mother    Diabetes Mother    Kidney disease Mother        ESRD   Cancer Mother        Bladder   Hypertension Father    Hyperlipidemia Father    Heart disease Father    Diabetes Father    Cancer  Father        Stomach Cancer   Diabetes Sister    Heart disease Sister    Alzheimer's disease Paternal Aunt    Stroke Maternal Grandfather    Cancer Paternal Grandfather        stomach   Cancer Maternal Uncle        prostate   Cancer Paternal Uncle        stomach   Social History   Socioeconomic History   Marital status: Married    Spouse name: Not on file   Number of children: Not on file   Years of education: Not on file   Highest education level: Not on file  Occupational History   Occupation: retired     Comment: Product manager for [employer]  Tobacco Use   Smoking status: Never   Smokeless tobacco: Never  Vaping Use   Vaping Use: Never used  Substance and Sexual Activity   Alcohol use: Yes    Alcohol/week: 0.0 standard drinks    Comment: 06/22/2017 "might have a drink twice a year"   Drug use: No   Sexual activity: Not on file  Other Topics Concern   Not on file  Social History Narrative   Not on file   Social Determinants of Health   Financial Resource Strain: Not on file  Food Insecurity: Not on file  Transportation Needs: Not on file  Physical Activity: Not on file  Stress: Not on file  Social Connections: Not on file    Tobacco Counseling Counseling given: Not Answered   Clinical Intake:                 Diabetic?Nutrition Risk Assessment:  Has the patient had any N/V/D within the last 2 months?  No  Does the patient have any non-healing wounds?  No  Has the patient had any unintentional weight loss or weight gain?  No   Diabetes:  Is the patient diabetic?  Yes  If diabetic, was a CBG obtained today?  No  Did the patient bring in their glucometer from home?  No  How often do you monitor your CBG's? Once daily.   Financial Strains and Diabetes Management:  Are you having any financial strains with the device, your supplies or your medication? No .  Does the patient want to be seen by Chronic Care Management for management  of their diabetes?  No  Would the patient like to be referred to a Nutritionist or for Diabetic Management?  No   Diabetic Exams:  Diabetic Eye Exam: Overdue for diabetic eye exam. Pt has been advised about the importance in completing this exam. Patient advised to call and schedule an eye exam. Diabetic Foot Exam: Overdue, Pt has been advised about the importance in completing this exam. Pt is scheduled for diabetic foot exam on 04/23/2021.          Activities of Daily Living No flowsheet data found.  Patient Care Team: Noreene Larsson, NP as PCP - General (Nurse Practitioner) Deboraha Sprang, MD as PCP - Electrophysiology (Cardiology) Satira Sark, MD as PCP - Cardiology (Cardiology) Edythe Clarity, [facility] as Pharmacist (Pharmacist)  Indicate any recent Medical Services you may have received from other than [facility] providers in the past year (date may be approximate).     Assessment:   This is a routine wellness examination for [place].  Hearing/Vision screen No results found.  Dietary issues and exercise activities discussed:     Goals Addressed   None   Depression Screen PHQ 2/9 Scores 03/30/2021 12/17/2020 11/14/2020 11/14/2020 07/30/2020 04/09/2020 07/23/2019  PHQ - 2 Score 0 1 4 2 1  0 0  PHQ- 9 Score - 9 14 11  - - -  Exception Documentation - - - - - - -    Fall Risk Fall Risk  03/30/2021 12/17/2020 11/14/2020 07/30/2020 07/08/2020  Falls in the past year? 0 0 1 0 1  Comment - - - - -  Number falls in past yr: 0 0 0 0 0  Injury with Fall? 0 0 0 0 0  Risk for fall due to : No Fall Risks No Fall Risks Impaired balance/gait No Fall Risks -  Risk for fall due to: Comment - - - - -  Follow up Falls evaluation completed Falls evaluation completed Falls evaluation completed Falls evaluation completed -    FALL RISK PREVENTION PERTAINING TO THE HOME:  Any stairs in or around the home? Yes  If so, are there any without handrails? Yes  Home free of loose throw rugs in  walkways, pet beds, electrical cords, etc? Yes  Adequate lighting in your home to reduce risk of falls? Yes   ASSISTIVE DEVICES UTILIZED TO PREVENT FALLS:  Life alert? No  Use of a cane, walker or w/c? No  Grab bars in the bathroom? No  Shower chair or bench in shower? Yes  Elevated toilet seat or a handicapped toilet? Yes    Cognitive Function:     6CIT Screen 04/09/2020  What Year? 0 points  What month? 0 points  What time? 0 points  Count back from 20 0 points  Months in reverse 0 points  Repeat phrase 0 points  Total Score 0    Immunizations Immunization History  Administered Date(s) Administered   Fluad Quad(high Dose 65+) 12/26/2018, 01/18/2020, 01/21/2021   Hep A / Hep B 05/26/2018, 06/26/2018, 11/22/2018   Influenza Split 01/14/2012   Influenza,inj,Quad PF,6+ Mos 02/19/2013,  02/17/2015, 01/27/2016, 01/14/2017, 02/09/2018   PFIZER(Purple Top)SARS-COV-2 Vaccination 06/24/2019, 07/15/2019, 04/09/2020   Pneumococcal Conjugate-13 08/02/2014   Zoster, Live 04/05/2014    TDAP status: Due, Education has been provided regarding the importance of this vaccine. Advised may receive this vaccine at local pharmacy or Health Dept. Aware to provide a copy of the vaccination record if obtained from local pharmacy or Health Dept. Verbalized acceptance and understanding.  Flu Vaccine status: Up to date  Pneumococcal vaccine status: Due, Education has been provided regarding the importance of this vaccine. Advised may receive this vaccine at local pharmacy or Health Dept. Aware to provide a copy of the vaccination record if obtained from local pharmacy or Health Dept. Verbalized acceptance and understanding.  Covid-19 vaccine status: Information provided on how to obtain vaccines.   Qualifies for Shingles Vaccine? Yes   Zostavax completed Yes   Shingrix Completed?: No.    Education has been provided regarding the importance of this vaccine. Patient has been advised to call  insurance company to determine out of pocket expense if they have not yet received this vaccine. Advised may also receive vaccine at local pharmacy or Health Dept. Verbalized acceptance and understanding.  Screening Tests Health Maintenance  Topic Date Due   Zoster Vaccines- Shingrix (1 of 2) Never done   Pneumonia Vaccine 110+ Years old (2 - PPSV23 if available, else PCV20) 08/02/2015   COVID-19 Vaccine (4 - Booster for Pfizer series) 06/04/2020   OPHTHALMOLOGY EXAM  12/24/2020   HEMOGLOBIN A1C  01/27/2021   FOOT EXAM  04/09/2021   TETANUS/TDAP  04/25/2038 (Originally 01/02/2004)   DEXA SCAN  04/19/2048 (Originally 08/25/2007)   URINE MICROALBUMIN  02/23/2022   INFLUENZA VACCINE  Completed   Hepatitis C Screening  Completed   HPV VACCINES  Aged Out    Health Maintenance  Health Maintenance Due  Topic Date Due   Zoster Vaccines- Shingrix (1 of 2) Never done   Pneumonia Vaccine 63+ Years old (2 - PPSV23 if available, else PCV20) 08/02/2015   COVID-19 Vaccine (4 - Booster for Pfizer series) 06/04/2020   OPHTHALMOLOGY EXAM  12/24/2020   HEMOGLOBIN A1C  01/27/2021   FOOT EXAM  04/09/2021    Colorectal cancer screening: Type of screening: Cologuard. Completed 05/25/2018. Repeat every 3 years  Mammogram status: Completed 10/22/2020. Repeat every year  Bone Density status: Ordered 04/21/2021. Pt provided with contact info and advised to call to schedule appt.  Lung Cancer Screening: (Low Dose CT Chest recommended if Age 76-80 years, 30 pack-year currently smoking OR have quit w/in 15years.) does not qualify.   Lung Cancer Screening Referral: n/a  Additional Screening:  Hepatitis C Screening: does qualify; Completed 12/04/2014  Vision Screening: Recommended annual ophthalmology exams for early detection of glaucoma and other disorders of the eye. Is the patient up to date with their annual eye exam?  No  Who is the provider or what is the name of the office in which the patient  attends annual eye exams? [facility] If pt is not established with a provider, would they like to be referred to a provider to establish care? No .   Dental Screening: Recommended annual dental exams for proper oral hygiene  Community Resource Referral / Chronic Care Management: CRR required this visit?  No   CCM required this visit?  No      Plan:     I have personally reviewed and noted the following in the patients chart:   Medical and social history Use  of alcohol, tobacco or illicit drugs  Current medications and supplements including opioid prescriptions.  Functional ability and status Nutritional status Physical activity Advanced directives List of other physicians Hospitalizations, surgeries, and ER visits in previous 12 months Vitals Screenings to include cognitive, depression, and falls Referrals and appointments  In addition, I have reviewed and discussed with patient certain preventive protocols, quality metrics, and best practice recommendations. A written personalized care plan for preventive services as well as general preventive health recommendations were provided to patient.     Earline Mayotte, [place]   04/21/2021   Nurse Notes:  Ms. Culverhouse , Thank you for taking time to come for your Medicare Wellness Visit. I appreciate your ongoing commitment to your health goals. Please review the following plan we discussed and let me know if I can assist you in the future.   These are the goals we discussed:  Goals      Pharmacy Care Plan:     CARE PLAN ENTRY (see longitudinal plan of care for additional care plan information)  Current Barriers:  Chronic Disease Management support, education, and care coordination needs related to Hypertension, Hyperlipidemia, and Diabetes   Hypertension BP Readings from Last 3 Encounters:  01/18/20 140/68  01/10/20 134/86  01/02/20 (!) 168/84  Pharmacist Clinical Goal(s): Over the next 180 days, patient will work with  PharmD and providers to maintain BP goal <140/90 Current regimen:  Metoprolol XL 45m daily Interventions: Reviewed home blood pressure monitoring Comprehensive medication review Patient self care activities - Over the next 180 days, patient will: Check BP periodically, document, and provide at future appointments Ensure daily salt intake < 2300 mg/day  Hyperlipidemia Lab Results  Component Value Date/Time   LDLCALC 67 11/30/2019 10:32 AM   LDLDIRECT 102 (H) 12/09/2011 10:35 AM  Pharmacist Clinical Goal(s): Over the next 180 days, patient will work with PharmD and providers to maintain LDL goal < 70 Current regimen:  Rosuvastatin 51mdaily Interventions: Reviewed most recent lipid panel Discussed importance of statins Patient self care activities - Over the next 180 days, patient will: Continue to focus on medication adherence by pill count  Diabetes Lab Results  Component Value Date/Time   HGBA1C 6.3 (H) 11/30/2019 10:32 AM   HGBA1C 6.4 (H) 04/26/2019 11:27 AM  Pharmacist Clinical Goal(s): Over the next 180 days, patient will work with PharmD and providers to maintain A1c goal <6.5% Current regimen:  Trulicity 0.9.56LOnce weekly Glipizide 1058mne-half tablet twice daily Metformin 1000m24mice daily Interventions: Reviewed home blood sugar readings Discussed hypoglycemia and how to treat Patient self care activities - Over the next 180 days, patient will: Check blood sugar once daily, document, and provide at future appointments Contact provider with any episodes of hypoglycemia   Initial goal documentation         This is a list of the screening recommended for you and due dates:  Health Maintenance  Topic Date Due   Zoster (Shingles) Vaccine (1 of 2) Never done   Pneumonia Vaccine (2 - PPSV23 if available, else PCV20) 08/02/2015   COVID-19 Vaccine (4 - Booster for PfizSalinaies) 06/04/2020   Eye exam for diabetics  12/24/2020   Hemoglobin A1C  01/27/2021    Complete foot exam   04/09/2021   Tetanus Vaccine  04/25/2038*   DEXA scan (bone density measurement)  04/19/2048*   Urine Protein Check  02/23/2022   Flu Shot  Completed   Hepatitis C Screening: USPSTF Recommendation to screen - Ages 18-79  yo.  Completed   HPV Vaccine  Aged Out  *Topic was postponed. The date shown is not the original due date.

## 2021-04-21 NOTE — Patient Instructions (Addendum)
Julie Clay , Thank you for taking time to come for your Medicare Wellness Visit. I appreciate your ongoing commitment to your health goals. Please review the following plan we discussed and let me know if I can assist you in the future.   These are the goals we discussed:  Goals      Pharmacy Care Plan:     CARE PLAN ENTRY (see longitudinal plan of care for additional care plan information)  Current Barriers:  Chronic Disease Management support, education, and care coordination needs related to Hypertension, Hyperlipidemia, and Diabetes   Hypertension BP Readings from Last 3 Encounters:  01/18/20 140/68  01/10/20 134/86  01/02/20 (!) 168/84  Pharmacist Clinical Goal(s): Over the next 180 days, patient will work with PharmD and providers to maintain BP goal <140/90 Current regimen:  Metoprolol XL 25mg  daily Interventions: Reviewed home blood pressure monitoring Comprehensive medication review Patient self care activities - Over the next 180 days, patient will: Check BP periodically, document, and provide at future appointments Ensure daily salt intake < 2300 mg/day  Hyperlipidemia Lab Results  Component Value Date/Time   LDLCALC 67 11/30/2019 10:32 AM   LDLDIRECT 102 (H) 12/09/2011 10:35 AM  Pharmacist Clinical Goal(s): Over the next 180 days, patient will work with PharmD and providers to maintain LDL goal < 70 Current regimen:  Rosuvastatin 5mg  daily Interventions: Reviewed most recent lipid panel Discussed importance of statins Patient self care activities - Over the next 180 days, patient will: Continue to focus on medication adherence by pill count  Diabetes Lab Results  Component Value Date/Time   HGBA1C 6.3 (H) 11/30/2019 10:32 AM   HGBA1C 6.4 (H) 04/26/2019 11:27 AM  Pharmacist Clinical Goal(s): Over the next 180 days, patient will work with PharmD and providers to maintain A1c goal <6.5% Current regimen:  Trulicity 0.75mg  once weekly Glipizide 10mg   one-half tablet twice daily Metformin 1000mg  twice daily Interventions: Reviewed home blood sugar readings Discussed hypoglycemia and how to treat Patient self care activities - Over the next 180 days, patient will: Check blood sugar once daily, document, and provide at future appointments Contact provider with any episodes of hypoglycemia   Initial goal documentation         This is a list of the screening recommended for you and due dates:  Health Maintenance  Topic Date Due   Zoster (Shingles) Vaccine (1 of 2) Never done   Pneumonia Vaccine (2 - PPSV23 if available, else PCV20) 08/02/2015   COVID-19 Vaccine (4 - Booster for Pfizer series) 06/04/2020   Eye exam for diabetics  12/24/2020   Hemoglobin A1C  01/27/2021   Complete foot exam   04/09/2021   Tetanus Vaccine  04/25/2038*   DEXA scan (bone density measurement)  04/19/2048*   Urine Protein Check  02/23/2022   Flu Shot  Completed   Hepatitis C Screening: USPSTF Recommendation to screen - Ages 18-79 yo.  Completed   HPV Vaccine  Aged Out  *Topic was postponed. The date shown is not the original due date.    Health Maintenance, Female Adopting a healthy lifestyle and getting preventive care are important in promoting health and wellness. Ask your health care provider about: The right schedule for you to have regular tests and exams. Things you can do on your own to prevent diseases and keep yourself healthy. What should I know about diet, weight, and exercise? Eat a healthy diet  Eat a diet that includes plenty of vegetables, fruits, low-fat dairy products, and lean protein.  Do not eat a lot of foods that are high in solid fats, added sugars, or sodium. Maintain a healthy weight Body mass index (BMI) is used to identify weight problems. It estimates body fat based on height and weight. Your health care provider can help determine your BMI and help you achieve or maintain a healthy weight. Get regular exercise Get  regular exercise. This is one of the most important things you can do for your health. Most adults should: Exercise for at least 150 minutes each week. The exercise should increase your heart rate and make you sweat (moderate-intensity exercise). Do strengthening exercises at least twice a week. This is in addition to the moderate-intensity exercise. Spend less time sitting. Even light physical activity can be beneficial. Watch cholesterol and blood lipids Have your blood tested for lipids and cholesterol at 79 years of age, then have this test every 5 years. Have your cholesterol levels checked more often if: Your lipid or cholesterol levels are high. You are older than 79 years of age. You are at high risk for heart disease. What should I know about cancer screening? Depending on your health history and family history, you may need to have cancer screening at various ages. This may include screening for: Breast cancer. Cervical cancer. Colorectal cancer. Skin cancer. Lung cancer. What should I know about heart disease, diabetes, and high blood pressure? Blood pressure and heart disease High blood pressure causes heart disease and increases the risk of stroke. This is more likely to develop in people who have high blood pressure readings or are overweight. Have your blood pressure checked: Every 3-5 years if you are 24-53 years of age. Every year if you are 52 years old or older. Diabetes Have regular diabetes screenings. This checks your fasting blood sugar level. Have the screening done: Once every three years after age 20 if you are at a normal weight and have a low risk for diabetes. More often and at a younger age if you are overweight or have a high risk for diabetes. What should I know about preventing infection? Hepatitis B If you have a higher risk for hepatitis B, you should be screened for this virus. Talk with your health care provider to find out if you are at risk for  hepatitis B infection. Hepatitis C Testing is recommended for: Everyone born from [birth year] through 1963. Anyone with known risk factors for hepatitis C. Sexually transmitted infections (STIs) Get screened for STIs, including gonorrhea and chlamydia, if: You are sexually active and are younger than 79 years of age. You are older than 79 years of age and your health care provider tells you that you are at risk for this type of infection. Your sexual activity has changed since you were last screened, and you are at increased risk for chlamydia or gonorrhea. Ask your health care provider if you are at risk. Ask your health care provider about whether you are at high risk for HIV. Your health care provider may recommend a prescription medicine to help prevent HIV infection. If you choose to take medicine to prevent HIV, you should first get tested for HIV. You should then be tested every 3 months for as long as you are taking the medicine. Pregnancy If you are about to stop having your period (premenopausal) and you may become pregnant, seek counseling before you get pregnant. Take 400 to 800 micrograms (mcg) of folic acid every day if you become pregnant. Ask for birth control (contraception) if  you want to prevent pregnancy. Osteoporosis and menopause Osteoporosis is a disease in which the bones lose minerals and strength with aging. This can result in bone fractures. If you are 13 years old or older, or if you are at risk for osteoporosis and fractures, ask your health care provider if you should: Be screened for bone loss. Take a calcium or vitamin D supplement to lower your risk of fractures. Be given hormone replacement therapy (HRT) to treat symptoms of menopause. Follow these instructions at home: Alcohol use Do not drink alcohol if: Your health care provider tells you not to drink. You are pregnant, may be pregnant, or are planning to become pregnant. If you drink alcohol: Limit how much  you have to: 0-1 drink a day. Know how much alcohol is in your drink. In the [place] one drink equals one 12 oz bottle of beer (355 mL), one 5 oz glass of wine (148 mL), or one 1 oz glass of hard liquor (44 mL). Lifestyle Do not use any products that contain nicotine or tobacco. These products include cigarettes, chewing tobacco, and vaping devices, such as e-cigarettes. If you need help quitting, ask your health care provider. Do not use street drugs. Do not share needles. Ask your health care provider for help if you need support or information about quitting drugs. General instructions Schedule regular health, dental, and eye exams. Stay current with your vaccines. Tell your health care provider if: You often feel depressed. You have ever been abused or do not feel safe at home. Summary Adopting a healthy lifestyle and getting preventive care are important in promoting health and wellness. Follow your health care provider's instructions about healthy diet, exercising, and getting tested or screened for diseases. Follow your health care provider's instructions on monitoring your cholesterol and blood pressure. This information is not intended to replace advice given to you by your health care provider. Make sure you discuss any questions you have with your health care provider. Document Revised: 08/25/2020 Document Reviewed: 08/25/2020 Elsevier Patient Education  [place].

## 2021-04-23 ENCOUNTER — Ambulatory Visit (INDEPENDENT_AMBULATORY_CARE_PROVIDER_SITE_OTHER): Payer: Medicare Other | Admitting: Nurse Practitioner

## 2021-04-23 ENCOUNTER — Other Ambulatory Visit: Payer: Self-pay

## 2021-04-23 ENCOUNTER — Encounter: Payer: Self-pay | Admitting: Nurse Practitioner

## 2021-04-23 VITALS — BP 110/78 | HR 87 | Resp 17 | Ht 65.0 in | Wt 223.1 lb

## 2021-04-23 DIAGNOSIS — E1143 Type 2 diabetes mellitus with diabetic autonomic (poly)neuropathy: Secondary | ICD-10-CM | POA: Diagnosis not present

## 2021-04-23 DIAGNOSIS — E782 Mixed hyperlipidemia: Secondary | ICD-10-CM

## 2021-04-23 DIAGNOSIS — R197 Diarrhea, unspecified: Secondary | ICD-10-CM

## 2021-04-23 DIAGNOSIS — I10 Essential (primary) hypertension: Secondary | ICD-10-CM | POA: Diagnosis not present

## 2021-04-23 LAB — POCT GLYCOSYLATED HEMOGLOBIN (HGB A1C): HbA1c, POC (prediabetic range): 6.4 % (ref 5.7–6.4)

## 2021-04-23 MED ORDER — LOPERAMIDE HCL 2 MG PO TABS: 2.0000 mg | ORAL_TABLET | Freq: Four times a day (QID) | ORAL | 1 refills | Status: DC | PRN

## 2021-04-23 NOTE — Assessment & Plan Note (Signed)
Lab Results  Component Value Date   HGBA1C 6.4 04/23/2021   Pt stated that she has 2 months supply of trulicity 0.75mg  solution at home and will be willing to switch to a higher dose and get off glipizide once she is done with the trulicity at home.  Take  glipizide 5mg  BID(she has been taking med differently) Metformin 1000mg   BID. Has eye exam coning up. Refused foot exam today.

## 2021-04-23 NOTE — Progress Notes (Signed)
diarrhea

## 2021-04-23 NOTE — Patient Instructions (Signed)
Please take imodium as needed for your diarrhea and follow up with GI as we discussed     It is important that you exercise regularly at least 30 minutes 5 times a week.  Think about what you will eat, plan ahead. Choose " clean, green, fresh or frozen" over canned, processed or packaged foods which are more sugary, salty and fatty. 70 to 75% of food eaten should be vegetables and fruit. Three meals at set times with snacks allowed between meals, but they must be fruit or vegetables. Aim to eat over a 12 hour period , example 7 am to 7 pm, and STOP after  your last meal of the day. Drink water,generally about 64 ounces per day, no other drink is as healthy. Fruit juice is best enjoyed in a healthy way, by EATING the fruit.  Thanks for choosing [facility], we consider it a privelige to serve you.

## 2021-04-23 NOTE — Assessment & Plan Note (Signed)
Chronic diarrhea alternating with constipation. She saw GI in May 2022, she was asked to follow up in 6 months bust she has not gone back to tem . She  was told to increase fiber, fiber did not help her symptoms. Imodium helps.  She does not think metformin is responsible for her Diarrhea, started long after she started metformin.  Pt advised to follow up with GI Use immodim as needed.

## 2021-04-23 NOTE — Assessment & Plan Note (Addendum)
DASH diet and commitment to daily physical activity for a minimum of 30 minutes discussed and encouraged, as a part of hypertension management. The importance of attaining a healthy weight is also discussed.  BP/Weight 04/23/2021 12/17/2020 11/14/2020 11/10/2020 11/06/2020 10/16/2020 9/43/2003  Systolic BP 794 446 190 122 241 146 431  Diastolic BP 78 69 83 73 76 76 81  Wt. (Lbs) 223.12 224 227 225.7 228 228 222  BMI 37.13 37.28 37.77 37.56 37.94 37.94 36.94  takes metoprolol 25mg  daily, torsemide 20mg  three times weekly

## 2021-04-23 NOTE — Assessment & Plan Note (Signed)
Eat a healthy diet, including lots of fruits and vegetables. Avoid foods with a lot of saturated and trans fats, such as red meat, butter, fried foods and cheese . Maintain a healthy weight. Continue crestor 5mg  daily Lipid panel next visit.

## 2021-04-23 NOTE — Progress Notes (Signed)
° °  Julie Clay     MRN: [id number]      DOB: 1940/04/25   HPI Julie Clay here for c/o chronic diarrhea alternating with constipation, some days she has 3 diarrhea daily, has been GI they told her to take fiber, but fiber made her diarrhea worse. Imodium helps for a day or two.Denies bloody stools, sometimes have crampy abd pain, no fever , no chills, no fever , no chills.  Her grandparents and father died of stomach cancer.    Pt educated on the need to get covid,shingles and pneumonia vaccine.    ROS Denies recent fever or chills. Denies sinus pressure, nasal congestion, ear pain or sore throat. Denies chest congestion, productive cough or wheezing. Denies chest pains, palpitations and leg swelling Denies abdominal pain, nausea, vomiting, has diarrhea and  constipation. Denies bloody stool,  Denies dysuria, frequency, hesitancy or incontinence. Denies joint pain, swelling and limitation in mobility. Denies headaches, seizures, numbness, or tingling. Denies depression, anxiety or insomnia. Denies skin break down or rash.   PE  There were no vitals taken for this visit.  Patient alert and oriented and in no cardiopulmonary distress.  HEENT: No facial asymmetry, EOMI,     Neck supple .  Chest: Clear to auscultation bilaterally.  CVS: S1, S2 no murmurs, no S3.Regular rate.  ABD: Soft non tender. obesse  Ext: No edema  MS: Adequate ROM spine, shoulders, hips and knees.  Skin: Intact, no ulcerations or rash noted.  Psych: Good eye contact, normal affect. Memory intact not anxious or depressed appearing.  CNS: CN 2-12 intact, power,  normal throughout.no focal deficits noted.   Assessment & Plan

## 2021-04-26 ENCOUNTER — Other Ambulatory Visit: Payer: Self-pay | Admitting: Cardiology

## 2021-05-05 ENCOUNTER — Other Ambulatory Visit: Payer: Self-pay | Admitting: Family Medicine

## 2021-05-18 DIAGNOSIS — Z20822 Contact with and (suspected) exposure to covid-19: Secondary | ICD-10-CM | POA: Diagnosis not present

## 2021-05-19 ENCOUNTER — Other Ambulatory Visit (HOSPITAL_COMMUNITY): Payer: Medicare Other

## 2021-05-19 ENCOUNTER — Ambulatory Visit (HOSPITAL_COMMUNITY): Payer: Medicare Other | Admitting: Physician Assistant

## 2021-05-19 NOTE — Progress Notes (Deleted)
NO SHOW

## 2021-05-20 ENCOUNTER — Inpatient Hospital Stay (HOSPITAL_COMMUNITY): Payer: Medicare Other | Admitting: Physician Assistant

## 2021-05-20 ENCOUNTER — Other Ambulatory Visit: Payer: Self-pay

## 2021-05-20 ENCOUNTER — Ambulatory Visit (INDEPENDENT_AMBULATORY_CARE_PROVIDER_SITE_OTHER): Payer: Medicare Other | Admitting: Internal Medicine

## 2021-05-20 ENCOUNTER — Encounter: Payer: Self-pay | Admitting: Internal Medicine

## 2021-05-20 ENCOUNTER — Inpatient Hospital Stay (HOSPITAL_COMMUNITY): Payer: Medicare Other

## 2021-05-20 VITALS — BP 106/82 | HR 95 | Resp 18 | Ht 65.0 in | Wt 219.1 lb

## 2021-05-20 DIAGNOSIS — R197 Diarrhea, unspecified: Secondary | ICD-10-CM | POA: Diagnosis not present

## 2021-05-20 DIAGNOSIS — F411 Generalized anxiety disorder: Secondary | ICD-10-CM | POA: Insufficient documentation

## 2021-05-20 DIAGNOSIS — I5032 Chronic diastolic (congestive) heart failure: Secondary | ICD-10-CM | POA: Diagnosis not present

## 2021-05-20 DIAGNOSIS — I10 Essential (primary) hypertension: Secondary | ICD-10-CM

## 2021-05-20 DIAGNOSIS — E1143 Type 2 diabetes mellitus with diabetic autonomic (poly)neuropathy: Secondary | ICD-10-CM | POA: Diagnosis not present

## 2021-05-20 DIAGNOSIS — I5030 Unspecified diastolic (congestive) heart failure: Secondary | ICD-10-CM | POA: Insufficient documentation

## 2021-05-20 DIAGNOSIS — K219 Gastro-esophageal reflux disease without esophagitis: Secondary | ICD-10-CM | POA: Insufficient documentation

## 2021-05-20 MED ORDER — LORAZEPAM 0.5 MG PO TABS: 0.5000 mg | ORAL_TABLET | Freq: Two times a day (BID) | ORAL | 0 refills | Status: DC | PRN

## 2021-05-20 MED ORDER — TRULICITY 1.5 MG/0.5ML ~~LOC~~ SOAJ: 1.5000 mg | SUBCUTANEOUS | 2 refills | Status: DC

## 2021-05-20 NOTE — Assessment & Plan Note (Signed)
Appears euvolemic currently Takes Demadex every other day Needs to start taking Farxiga Followed by Cardiology

## 2021-05-20 NOTE — Assessment & Plan Note (Signed)
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

## 2021-05-20 NOTE — Patient Instructions (Addendum)
Please start taking Trulicity 1.5 mg once you complete 0.75 mg dose.  Please start taking Wilder Glade and stop taking Metformin. Continue taking Glipizide twice daily until you start higher dose of Trulicity.  Please contact us if your blood glucose is more than 300 or less than 70.

## 2021-05-20 NOTE — Progress Notes (Signed)
Established Patient Office Visit  Subjective:  Patient ID: Julie Clay, female    DOB: 1940-07-11  Age: 81 y.o. MRN: [id number]  CC:  Chief Complaint  Patient presents with   Follow-up    3 month follow up labs pt did not have labs drawn pt having issue of of diarrhea has been going on for 3 years on and off has saw someone with [facility] but did not care for him pt would also like lorazepam refilled has not had this since 2020    HPI Julie Clay is a 81 y.o. female with past medical history of HTN, asthma, type II DM with neuropathy, GERD, chronic diarrhea, breast ca. and GAD who presents for f/u of her chronic medical conditions.  Chronic diarrhea: She has been having diarrhea, which is chronic and intermittent.  She has seen GI in the past, who recommended Imodium as needed for diarrhea, which has helped her.  She denies any melena or hematochezia currently.  Denies any fever or chills.  Type II DM: She has been taking metformin, glipizide and Trulicity.  She agrees to have a trial without metformin to see if her diarrhea improves.  Instead, she agrees to start taking Wilder Glade, which was started by her cardiologist for HFpEF, but she had not been taking it.  Denies any polyuria or polydipsia currently.  She takes Crestor for HLD.  GAD: She has been stressed recently as her husband is admitted in the hospital. She has Ativan that she takes PRN for severe anxiety, which was filled more than 2 years ago, prescribed by Dr. Buelah Manis.  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    Osteoarthritis    both knees, lower back, both shoulders; bone spurs to feet   Rectocele 06/25/2013   Posterior repair 11/06/13    Type II diabetes mellitus ([place])    Ventral hernia     Past Surgical History:  Procedure Laterality Date   BREAST BIOPSY Left 04/2011   BREAST BIOPSY Left 06/22/13   BREAST BIOPSY Left 09/12/2013   Procedure: BREAST BIOPSY WITH NEEDLE LOCALIZATION;  Surgeon: Jamesetta So, MD;  Location: [facility];  Service: General;  Laterality: Left;   BREAST LUMPECTOMY Left 04/2011 X 2   BREAST SURGERY N/A    Phreesia 01/17/2020   CATARACT EXTRACTION W/ INTRAOCULAR LENS  IMPLANT, BILATERAL Bilateral    CATARACT EXTRACTION, BILATERAL     on different occassions    [place] N/A    2014   DILATION AND CURETTAGE OF UTERUS  11/06/2013   HAND LIGAMENT RECONSTRUCTION Right ~ 2010   HYSTEROSCOPY WITH D & C N/A 11/06/2013   Procedure: DILATATION AND  CURETTAGE /HYSTEROSCOPY;  Surgeon: Jonnie Kind, MD;  Location: [facility];  Service: Gynecology;  Laterality: N/A;   INSERT / REPLACE / REMOVE PACEMAKER  06/22/2017   LUNG LOBECTOMY Right 1988   Fungal Infection   PACEMAKER IMPLANT N/A 06/22/2017   Procedure: PACEMAKER IMPLANT;  Surgeon: Deboraha Sprang, MD;  Location: [facility];  Service: Cardiovascular;  Laterality: N/A;   PARTIAL MASTECTOMY WITH NEEDLE LOCALIZATION Right 01/02/2020   Procedure: RIGHT PARTIAL MASTECTOMY AFTER NEEDLE LOCALIZATION;  Surgeon: Aviva Signs, MD;  Location: [facility];  Service: General;  Laterality: Right;   POLYPECTOMY N/A 11/06/2013   Procedure: POLYPECTOMY (REMOVAL ENDOMETRIAL POLYP);  Surgeon: Jonnie Kind, MD;  Location: [facility];  Service: Gynecology;  Laterality: N/A;   RECTOCELE REPAIR N/A 11/06/2013   Procedure: POSTERIOR REPAIR  (RECTOCELE);  Surgeon: Jonnie Kind, MD;  Location: [facility];  Service: Gynecology;  Laterality: N/A;   SHOULDER ARTHROSCOPY WITH OPEN ROTATOR CUFF REPAIR Left 07/26/2012   TONSILLECTOMY  1949   TUBAL LIGATION      Family History  Problem Relation Age of Onset   Hypertension Mother    Heart disease Mother    Diabetes Mother    Kidney disease Mother        ESRD   Cancer Mother        Bladder   Hypertension Father    Hyperlipidemia Father    Heart disease Father    Diabetes Father    Cancer Father        Stomach Cancer   Diabetes Sister    Heart disease Sister    Alzheimer's disease Paternal Aunt    Stroke Maternal Grandfather    Cancer Paternal Grandfather        stomach   Cancer Maternal Uncle        prostate   Cancer Paternal Uncle        stomach    Social History   Socioeconomic History   Marital status: Married    Spouse name: Tommi Rumps   Number of children: 2   Years of education: 12   Highest education level: Some college, no degree  Occupational History   Occupation: retired     Comment: Product manager for [employer]  Tobacco Use   Smoking status: Never   Smokeless tobacco: Never  Vaping Use   Vaping Use: Never used  Substance and Sexual Activity   Alcohol use: Yes    Comment: 06/22/2017 "might have a drink twice a year"   Drug use: No   Sexual activity: Not Currently    Birth control/protection: Surgical  Other Topics Concern   Not on file  Social History Narrative   Not on file   Social Determinants of Health   Financial Resource Strain: Low Risk    Difficulty of Paying Living Expenses: Not hard at all  Food Insecurity: No Food Insecurity   Worried About Charity fundraiser in the Last Year: Never true   Ran Out of Food in the Last Year: Never true  Transportation Needs: No Transportation Needs   Lack of Transportation (Medical): No   Lack of Transportation (Non-Medical): No  Physical Activity: Inactive   Days of Exercise per Week: 0 days    Minutes of Exercise per Session: 0 min  Stress: No Stress Concern Present   Feeling of Stress : Not at all  Social Connections: Socially Integrated   Frequency of Communication with Friends and Family: More than three times a  week   Frequency of Social Gatherings with Friends and Family: Once a week   Attends Religious Services: More than 4 times per year   Active Member of [employer] or Organizations: Yes   Attends Music therapist: More than 4 times per year   Marital Status: Married  Human resources officer Violence: Not At Risk   Fear of Current or Ex-Partner: No   Emotionally Abused: No   Physically Abused: No   Sexually Abused: No    Outpatient Medications Prior to Visit  Medication Sig Dispense Refill   acetaminophen (TYLENOL) 500 MG tablet Take 1,000 mg by mouth every 6 (six) hours as needed for moderate pain.     albuterol (VENTOLIN HFA) 108 (90 Base) MCG/ACT inhaler Inhale 2 puffs into the lungs every 6 (six) hours as needed for wheezing or shortness of breath. 8 g 0   Aloe Vera Leaf POWD Take 800 mg by mouth 2 (two) times daily.     Ascorbic Acid (VITAMIN C) 1000 MG tablet Take 1,000 mg by mouth 2 (two) times daily.     b complex vitamins tablet Take 1 tablet by mouth 2 (two) times daily.      Blood Glucose Monitoring Suppl (BLOOD GLUCOSE SYSTEM PAK) KIT Use as directed to monitor FSBS 1x daily. Dx: E11.9. Please dispense as Accu-Chek Aviva 1 each 1   Calcium Polycarbophil (FIBER-CAPS PO) Take by mouth every other day.     Cholecalciferol (VITAMIN D3) 2000 UNITS TABS Take 2,000 Units by mouth 2 (two) times daily.     dapagliflozin propanediol (FARXIGA) 10 MG TABS tablet Take 1 tablet (10 mg total) by mouth daily before breakfast. 90 tablet 1   diclofenac Sodium (VOLTAREN) 1 % GEL Apply 2 g topically 4 (four) times daily. 100 g 1   fluocinonide cream (LIDEX) 1.70 % Apply 1 application topically 2 (two) times daily as needed (poison ivy/bites/hives). 30 g 11   Ginkgo Biloba  120 MG CAPS Take 120 mg by mouth in the morning and at bedtime.      glipiZIDE (GLUCOTROL) 10 MG tablet TAKE 1/2 (ONE-HALF) TABLET BY MOUTH TWICE DAILY BEFORE A MEAL FOR DIABETES 180 tablet 0   Glucose Blood (BLOOD GLUCOSE TEST STRIPS) STRP Use as directed to monitor FSBS 1x daily. Dx: E11.9. Please dispense as Accu-Chek Aviva 100 each 1   guaiFENesin-dextromethorphan (ROBITUSSIN DM) 100-10 MG/5ML syrup Take 5 mLs by mouth every 4 (four) hours as needed for cough. 118 mL 0   Krill Oil 1000 MG CAPS Take 1,000 mg by mouth 2 (two) times daily.      Lancets MISC Use as directed to monitor FSBS 1x daily. Dx: E11.9. Please dispense as Accu-Chek Aviva 100 each 1   loperamide (IMODIUM A-D) 2 MG tablet Take 1 tablet (2 mg total) by mouth 4 (four) times daily as needed for diarrhea or loose stools. 30 tablet 1   metoprolol succinate (TOPROL-XL) 25 MG 24 hr tablet Take 1/2 (one-half) tablet by mouth once daily 15 tablet 0   Multiple Vitamin (MULTIVITAMIN WITH MINERALS) TABS tablet Take 1 tablet by mouth daily.     naftifine (NAFTIN) 1 % cream Apply topically 2 (two) times daily as needed. 60 g 2   rosuvastatin (CRESTOR) 5 MG tablet TAKE 1 TABLET BY MOUTH ONCE DAILY FOR CHOLESTEROL 90 tablet 0   simethicone (MYLICON) 80 MG chewable tablet Chew 80 mg by mouth as needed for flatulence.      tiZANidine (ZANAFLEX) 4 MG tablet Take  1 tablet (4 mg total) by mouth every 6 (six) hours as needed for muscle spasms. 180 tablet 1   torsemide (DEMADEX) 20 MG tablet Take 20 mg by mouth 3 (three) times a week.     UNABLE TO FIND Please dispense custom molded foot orthotics and heel spurs Dx: E11.30 1 each 0   Dulaglutide (TRULICITY) 6.72 CN/4.7SJ SOPN Inject 0.75 mg into the skin once a week. 6 mL 3   metFORMIN (GLUCOPHAGE) 1000 MG tablet TAKE 1 TABLET BY MOUTH TWICE DAILY WITH A MEAL FOR DIABETES 180 tablet 0   LORazepam (ATIVAN) 0.5 MG tablet Take 1 tablet (0.5 mg total) by mouth 2 (two) times daily as needed for anxiety.  (Patient not taking: Reported on 05/20/2021) 30 tablet 1   No facility-administered medications prior to visit.    Allergies  Allergen Reactions   Arimidex [Anastrozole] Other (See Comments)    Arthralgias and myalgias.  Improved with 3 week hiatus from drug.  Categorical side effect of drug class.   Other Hives and Itching   Adhesive [Tape] Other (See Comments)    "red and blistered"   Lisinopril     Dry hacking cough and tickle   Tetanus Toxoids    Tetracyclines & Related Other (See Comments)    unknown    ROS Review of Systems  Constitutional:  Negative for chills and fever.  HENT:  Negative for congestion, sinus pressure, sinus pain and sore throat.   Eyes:  Negative for pain and discharge.  Respiratory:  Negative for cough and shortness of breath.   Cardiovascular:  Negative for chest pain and palpitations.  Gastrointestinal:  Positive for diarrhea. Negative for abdominal pain, nausea and vomiting.  Endocrine: Negative for polydipsia and polyuria.  Genitourinary:  Negative for dysuria and hematuria.  Musculoskeletal:  Negative for neck pain and neck stiffness.  Skin:  Negative for rash.  Neurological:  Negative for dizziness and weakness.  Psychiatric/Behavioral:  Negative for agitation and behavioral problems. The patient is nervous/anxious.      Objective:    Physical Exam Vitals reviewed.  Constitutional:      General: She is not in acute distress.    Appearance: She is obese. She is not diaphoretic.  HENT:     Head: Normocephalic and atraumatic.     Nose: Nose normal. No congestion.     Mouth/Throat:     Mouth: Mucous membranes are moist.     Pharynx: No posterior oropharyngeal erythema.  Eyes:     General: No scleral icterus.    Extraocular Movements: Extraocular movements intact.  Cardiovascular:     Rate and Rhythm: Normal rate and regular rhythm.     Pulses: Normal pulses.     Heart sounds: Normal heart sounds. No murmur heard. Pulmonary:     Breath  sounds: Normal breath sounds. No wheezing or rales.  Abdominal:     Palpations: Abdomen is soft.     Tenderness: There is no abdominal tenderness.  Musculoskeletal:     Cervical back: Neck supple. No tenderness.     Right lower leg: No edema.     Left lower leg: No edema.  Skin:    General: Skin is warm.     Findings: No rash.  Neurological:     General: No focal deficit present.     Mental Status: She is alert and oriented to person, place, and time.  Psychiatric:        Mood and Affect: Mood normal.  Behavior: Behavior normal.    BP 106/82 (BP Location: Right Arm, Patient Position: Sitting, Cuff Size: Normal)    Pulse 95    Resp 18    Ht 5' 5"  (1.651 m)    Wt 219 lb 1.3 oz (99.4 kg)    SpO2 99%    BMI 36.46 kg/m  Wt Readings from Last 3 Encounters:  05/20/21 219 lb 1.3 oz (99.4 kg)  04/23/21 223 lb 1.9 oz (101.2 kg)  12/17/20 224 lb (101.6 kg)    Lab Results  Component Value Date   TSH 3.12 01/18/2020   Lab Results  Component Value Date   WBC 9.0 02/23/2021   HGB 13.8 02/23/2021   HCT 42.8 02/23/2021   MCV 85 02/23/2021   PLT 280 02/23/2021   Lab Results  Component Value Date   NA 142 02/23/2021   K 4.7 02/23/2021   CO2 23 02/23/2021   GLUCOSE 121 (H) 02/23/2021   BUN 18 02/23/2021   CREATININE 0.89 02/23/2021   BILITOT 0.5 02/23/2021   ALKPHOS 58 02/23/2021   AST 21 02/23/2021   ALT 19 02/23/2021   PROT 6.6 02/23/2021   ALBUMIN 4.3 02/23/2021   CALCIUM 9.6 02/23/2021   ANIONGAP 11 10/22/2020   EGFR 66 02/23/2021   Lab Results  Component Value Date   CHOL 143 02/23/2021   Lab Results  Component Value Date   HDL 47 02/23/2021   Lab Results  Component Value Date   LDLCALC 63 02/23/2021   Lab Results  Component Value Date   TRIG 203 (H) 02/23/2021   Lab Results  Component Value Date   CHOLHDL 2.7 04/08/2020   Lab Results  Component Value Date   HGBA1C 6.4 04/23/2021      Assessment & Plan:   Problem List Items Addressed This  Visit       Cardiovascular and Mediastinum   Essential hypertension, benign - Primary    BP Readings from Last 1 Encounters:  05/20/21 106/82  Well-controlled Counseled for compliance with the medications Advised DASH diet and moderate exercise/walking as tolerated      (HFpEF) heart failure with preserved ejection fraction ([place])    Appears euvolemic currently Takes Demadex every other day Needs to start taking Farxiga Followed by Cardiology        Endocrine   DM (diabetes mellitus) ([place])    Lab Results  Component Value Date   HGBA1C 6.4 04/23/2021   On metformin 1000 mg twice daily, glipizide 5 mg twice daily, Trulicity 4.09 mg qw DC metformin to see if her diarrhea improves Increased Trulicity dose to 1.5 mg and glipizide 5 mg QD only Advised to start taking Farxiga Advised to follow diabetic diet On statin F/u CMP and lipid panel Diabetic foot exam: Today Diabetic eye exam: Advised to follow up with Ophthalmology for diabetic eye exam       Relevant Medications   Dulaglutide (TRULICITY) 1.5 BD/5.3GD SOPN     Other   Diarrhea    Chronic, could be related to recent stress/anxiety Imodium as needed 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    Dispense:  2 mL    Refill:  2   LORazepam (ATIVAN) 0.5 MG tablet    Sig: Take 1 tablet (0.5 mg total) by mouth 2 (two) times daily as needed for anxiety.    Dispense:  15 tablet    Refill:  0    Follow-up: Return in about 3 months (around 08/17/2021) for DM and chronic diarrhea.     Lindell Spar, MD

## 2021-05-20 NOTE — Assessment & Plan Note (Signed)
Lab Results  Component Value Date   HGBA1C 6.4 04/23/2021    On metformin 1000 mg twice daily, glipizide 5 mg twice daily, Trulicity 9.24 mg qw DC metformin to see if her diarrhea improves Increased Trulicity dose to 1.5 mg and glipizide 5 mg QD only Advised to start taking Farxiga Advised to follow diabetic diet On statin F/u CMP and lipid panel Diabetic foot exam: Today Diabetic eye exam: Advised to follow up with Ophthalmology for diabetic eye exam

## 2021-05-20 NOTE — Assessment & Plan Note (Signed)
BP Readings from Last 1 Encounters:  05/20/21 106/82   Well-controlled Counseled for compliance with the medications Advised DASH diet and moderate exercise/walking as tolerated

## 2021-05-20 NOTE — Assessment & Plan Note (Signed)
Takes Ativan occasionally for severe anxiety, refilled

## 2021-05-21 ENCOUNTER — Encounter (INDEPENDENT_AMBULATORY_CARE_PROVIDER_SITE_OTHER): Payer: Self-pay | Admitting: *Deleted

## 2021-05-21 DIAGNOSIS — R197 Diarrhea, unspecified: Secondary | ICD-10-CM | POA: Diagnosis not present

## 2021-05-23 DIAGNOSIS — Z20822 Contact with and (suspected) exposure to covid-19: Secondary | ICD-10-CM | POA: Diagnosis not present

## 2021-05-23 LAB — GI PROFILE, STOOL, PCR

## 2021-05-26 DIAGNOSIS — E119 Type 2 diabetes mellitus without complications: Secondary | ICD-10-CM | POA: Diagnosis not present

## 2021-05-26 LAB — HM DIABETES EYE EXAM

## 2021-06-01 ENCOUNTER — Other Ambulatory Visit: Payer: Self-pay | Admitting: Cardiology

## 2021-06-11 NOTE — Progress Notes (Signed)
Cardiology Office Note    Date:  06/13/2021   ID:  Julie Clay, Julie Clay 12-28-1940, MRN [id number]  PCP:  Lindell Spar, MD  Cardiologist: Rozann Lesches, MD   EP: Dr. Caryl Comes  Chief Complaint  Patient presents with   Follow-up    6 month visit    History of Present Illness:    Julie Clay is a 81 y.o. 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  She was examined by Dr. MDomenic Politein 09/2020 and reported stable dyspnea on exertion but denied any change in symptoms or associated chest pain. She was continued on her current cardiac medications. She did see Dr. KCaryl Comesin 10/2020 and was felt to be volume overloaded by examination, therefore it was recommended that she titrate Torsemide from PRN to three times weekly. He did recommend that she review with her PCP in regards to starting an SGLT2 inhibitor.  In talking with the patient and her husband today, her main issue at this time is abdominal pain and rectal bleeding. She reports having intermittent abdominal pain for the past few months but started to experience worsening abdominal cramping this morning which has been unrelenting and reports associated hematochezia. She is unsure if this is secondary to hemorrhoids or another cause. Denies any fever or chills. She is planning to go to the Emergency Department for further evaluation as her initial visit with GI is not until 07/2021.   From a cardiac perspective, she has been doing well and denies any recent chest pain or dyspnea on exertion. No recent orthopnea or PND. She does experience intermittent lower extremity edema and takes Torsemide 10 mg approximately 3 days a week.   Past Medical History:  Diagnosis Date   2nd degree AV block 06/22/2017   Arthritis    Asthma    Bone spur    Breast cancer, left breast ([facility]    2012   Breast  cancer, right breast ([facility]    2021   Chronic bronchitis (HCC)    COPD [place] obstructive [place] disease) [place]    Di[facility] [place] complication [facility]    Phreesia 144/31/5400  Diastolic heart failure ([facility]    Essential hypertension    Fatty liver 2013   GERD (gastroesophageal reflux disease)    HSV (herpes simplex virus) infection    Hyperlipidemia    Lung nodule seen on imaging study 2013   Lymphedema    Left arm   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    Osteoarthritis    both knees, lower back, both shoulders; bone spurs to feet   Rectocele 06/25/2013   Posterior repair 11/06/13    Type II diabetes mellitus ([facility]    Ventral hernia     Past Surgical History:  Procedure Laterality Date   BREAST BIOPSY Left 04/2011   BREAST BIOPSY Left 06/22/13   BREAST BIOPSY Left 09/12/2013   Procedure: BREAST BIOPSY WITH NEEDLE LOCALIZATION;  Surgeon: MJamesetta So MD;  Location: [facility];  Service: General;  Laterality: Left;   BREAST LUMPECTOMY Left 04/2011 X 2   BREAST SURGERY N/A    Phreesia 01/17/2020   CATARACT EXTRACTION W/ INTRAOCULAR LENS  IMPLANT, BILATERAL Bilateral    CATARACT EXTRACTION, BILATERAL     on different occassions    CESAREAN SECTION  [place] N/A    2014   DILATION AND CURETTAGE OF UTERUS  11/06/2013   HAND LIGAMENT RECONSTRUCTION Right ~ 2010   HYSTEROSCOPY WITH D & C N/A 11/06/2013   Procedure: DILATATION AND CURETTAGE /HYSTEROSCOPY;  Surgeon: Jonnie Kind, MD;  Location: [facility];  Service: Gynecology;  Laterality: N/A;   INSERT / REPLACE / REMOVE PACEMAKER  06/22/2017   LUNG LOBECTOMY Right 1988   Fungal Infection   PACEMAKER IMPLANT N/A 06/22/2017   Procedure: PACEMAKER IMPLANT;  Surgeon: Deboraha Sprang, MD;  Location: [facility];  Service: Cardiovascular;  Laterality: N/A;   PARTIAL MASTECTOMY WITH NEEDLE LOCALIZATION Right 01/02/2020   Procedure: RIGHT PARTIAL MASTECTOMY AFTER NEEDLE LOCALIZATION;  Surgeon: Aviva Signs, MD;  Location: [facility];  Service: General;  Laterality: Right;   POLYPECTOMY N/A 11/06/2013   Procedure: POLYPECTOMY (REMOVAL ENDOMETRIAL POLYP);  Surgeon: Jonnie Kind, MD;  Location: [facility];  Service: Gynecology;  Laterality: N/A;   RECTOCELE REPAIR N/A 11/06/2013   Procedure: POSTERIOR REPAIR (RECTOCELE);  Surgeon: Jonnie Kind, MD;  Location: [facility];  Service: Gynecology;  Laterality: N/A;   SHOULDER ARTHROSCOPY WITH OPEN ROTATOR CUFF REPAIR Left 07/26/2012   TONSILLECTOMY  1949   TUBAL LIGATION      Current Medications: Outpatient Medications Prior to Visit  Medication Sig Dispense Refill   acetaminophen (TYLENOL) 500 MG tablet Take 1,000 mg by mouth every 6 (six) hours as needed for moderate pain.     albuterol (VENTOLIN HFA) 108 (90 Base) MCG/ACT inhaler Inhale 2 puffs into the lungs every 6 (six) hours as needed for wheezing or shortness of breath. 8 g 0   Aloe Vera Leaf POWD Take 800 mg by mouth 2 (two) times daily.     Ascorbic Acid (VITAMIN C) 1000 MG tablet Take 1,000 mg by mouth 2 (two) times daily.     b complex vitamins tablet Take 1 tablet by mouth 2 (two) times daily.      Blood Glucose Monitoring Suppl (BLOOD GLUCOSE SYSTEM PAK) KIT Use as directed to monitor FSBS 1x daily. Dx: E11.9. Please dispense as Accu-Chek Aviva 1 each 1   Calcium Polycarbophil (FIBER-CAPS PO) Take by mouth every other day.     Cholecalciferol (VITAMIN D3) 2000 UNITS TABS Take 2,000 Units by mouth 2 (two) times daily.     dapagliflozin propanediol (FARXIGA) 10 MG TABS tablet Take 1 tablet (10 mg total) by mouth daily before breakfast. 90 tablet 1   diclofenac Sodium (VOLTAREN) 1 % GEL Apply 2 g topically 4 (four) times daily. 100 g 1   Dulaglutide (TRULICITY) 1.5 PF/7.9KW SOPN Inject 1.5 mg into the skin once a week. 2 mL 2   fluocinonide cream (LIDEX) 4.09 % Apply 1 application topically 2 (two) times daily as needed (poison ivy/bites/hives). 30 g 11   Ginkgo Biloba 120 MG CAPS Take 120 mg by  mouth in the morning and at bedtime.      glipiZIDE (GLUCOTROL) 10 MG tablet TAKE 1/2 (ONE-HALF) TABLET BY MOUTH TWICE DAILY BEFORE A MEAL FOR DIABETES 180 tablet 0   Glucose Blood (BLOOD GLUCOSE TEST STRIPS) STRP Use as directed to monitor FSBS 1x daily. Dx: E11.9. Please dispense as Accu-Chek Aviva 100 each 1   guaiFENesin-dextromethorphan (ROBITUSSIN DM) 100-10 MG/5ML syrup Take 5 mLs by mouth every 4 (four) hours as needed for cough. 118 mL 0   Krill Oil 1000 MG CAPS Take 1,000 mg by mouth 2 (two) times  daily.      Lancets MISC Use as directed to monitor FSBS 1x daily. Dx: E11.9. Please dispense as Accu-Chek Aviva 100 each 1   loperamide (IMODIUM A-D) 2 MG tablet Take 1 tablet (2 mg total) by mouth 4 (four) times daily as needed for diarrhea or loose stools. 30 tablet 1   LORazepam (ATIVAN) 0.5 MG tablet Take 1 tablet (0.5 mg total) by mouth 2 (two) times daily as needed for anxiety. 15 tablet 0   Multiple Vitamin (MULTIVITAMIN WITH MINERALS) TABS tablet Take 1 tablet by mouth daily.     naftifine (NAFTIN) 1 % cream Apply topically 2 (two) times daily as needed. 60 g 2   rosuvastatin (CRESTOR) 5 MG tablet TAKE 1 TABLET BY MOUTH ONCE DAILY FOR CHOLESTEROL 90 tablet 0   simethicone (MYLICON) 80 MG chewable tablet Chew 80 mg by mouth as needed for flatulence.      tiZANidine (ZANAFLEX) 4 MG tablet Take 1 tablet (4 mg total) by mouth every 6 (six) hours as needed for muscle spasms. 180 tablet 1   torsemide (DEMADEX) 20 MG tablet Take 20 mg by mouth 3 (three) times a week.     UNABLE TO FIND Please dispense custom molded foot orthotics and heel spurs Dx: E11.30 1 each 0   metoprolol succinate (TOPROL-XL) 25 MG 24 hr tablet TAKE 1/2 (ONE-HALF) TABLET BY MOUTH ONCE DAILY . APPOINTMENT REQUIRED FOR FUTURE REFILLS 30 tablet 1   No facility-administered medications prior to visit.     Allergies:   Arimidex [anastrozole], Other, Adhesive [tape], Lisinopril, Tetanus toxoids, and Tetracyclines & related    Social History   Socioeconomic History   Marital status: Married    Spouse name: Tommi Rumps   Number of children: 2   Years of education: 12   Highest education level: Some college, no degree  Occupational History   Occupation: retired     Comment: Product manager for [employer]  Tobacco Use   Smoking status: Never   Smokeless tobacco: Never  Vaping Use   Vaping Use: Never used  Substance and Sexual Activity   Alcohol use: Yes    Comment: 06/22/2017 "might have a drink twice a year"   Drug use: No   Sexual activity: Not Currently    Birth control/protection: Surgical  Other Topics Concern   Not on file  Social History Narrative   Not on file   Social Determinants of Health   Financial Resource Strain: Low Risk    Difficulty of Paying Living Expenses: Not hard at all  Food Insecurity: No Food Insecurity   Worried About Charity fundraiser in the Last Year: Never true   [place] in the Last Year: Never true  Transportation Needs: No Transportation Needs   Lack of Transportation (Medical): No   Lack of Transportation (Non-Medical): No  Physical Activity: Inactive   Days of Exercise per Week: 0 days   Minutes of Exercise per Session: 0 min  Stress: No Stress Concern Present   Feeling of Stress : Not at all  Social Connections: Socially Integrated   Frequency of Communication with Friends and Family: More than three times a week   Frequency of Social Gatherings with Friends and Family: Once a week   Attends Religious Services: More than 4 times per year   Active Member of [employer] or Organizations: Yes   Attends Music therapist: More than 4 times per year   Marital Status: Married  Family History:  The patient's family history includes Alzheimer's disease in her paternal aunt; Cancer in her father, maternal uncle, mother, paternal grandfather, and paternal uncle; Diabetes in her father, mother, and sister; Heart disease in her father, mother, and  sister; Hyperlipidemia in her father; Hypertension in her father and mother; Kidney disease in her mother; Stroke in her maternal grandfather.   Review of Systems:    Please see the history of present illness.     All other systems reviewed and are otherwise negative except as noted above.   Physical Exam:    VS:  BP (!) 166/84    Pulse 94    Ht 5' 5"  (1.651 m)    Wt 219 lb (99.3 kg)    SpO2 97%    BMI 36.44 kg/m    General: Pleasant elderly female appearing in no acute distress. Head: Normocephalic, atraumatic. Neck: No carotid bruits. JVD not elevated.  Lungs: Respirations regular and unlabored, without wheezes or rales.  Heart: Regular rate and rhythm. No S3 or S4.  No murmur, no rubs, or gallops appreciated. Abdomen: Appears non-distended. No obvious abdominal masses. Msk:  Strength and tone appear normal for age. No obvious joint deformities or effusions. Extremities: No clubbing or cyanosis. Trace lower extremity edema.  Distal pedal pulses are 2+ bilaterally. Neuro: Alert and oriented X 3. Moves all extremities spontaneously. No focal deficits noted. Psych:  Responds to questions appropriately with a normal affect. Skin: No rashes or lesions noted  Wt Readings from Last 3 Encounters:  06/12/21 219 lb (99.3 kg)  06/12/21 219 lb (99.3 kg)  05/20/21 219 lb 1.3 oz (99.4 kg)     Studies/Labs Reviewed:   EKG:  EKG is not ordered today.    Recent Labs: 06/12/2021: ALT 22; BUN 15; Creatinine, Ser 0.82; Hemoglobin 14.4; Platelets 256; Potassium 4.0; Sodium 142   Lipid Panel    Component Value Date/Time   CHOL 143 02/23/2021 0932   TRIG 203 (H) 02/23/2021 0932   HDL 47 02/23/2021 0932   CHOLHDL 2.7 04/08/2020 0925   VLDL 43 (H) 09/20/2016 1015   LDLCALC 63 02/23/2021 0932   LDLCALC 53 04/08/2020 0925   LDLDIRECT 102 (H) 12/09/2011 1035    Additional studies/ records that were reviewed today include:   Echocardiogram: 07/2020 IMPRESSIONS     1. Left ventricular  ejection fraction, by estimation, is 55 to 60%. The  left ventricle has normal function. The left ventricle has no regional  wall motion abnormalities. There is mild left ventricular hypertrophy.  Left ventricular diastolic parameters  are consistent with Grade I diastolic dysfunction (impaired relaxation).   2. Right ventricular systolic function is normal. The right ventricular  size is normal.   3. Left atrial size was mildly dilated.   4. The mitral valve is normal in structure. No evidence of mitral valve  regurgitation. No evidence of mitral stenosis. Moderate mitral annular  calcification.   5. The aortic valve is tricuspid. There is mild calcification of the  aortic valve. There is mild thickening of the aortic valve. Aortic valve  regurgitation is not visualized.   6. The inferior vena cava is normal in size with greater than 50%  respiratory variability, suggesting right atrial pressure of 3 mmHg.   Assessment:    1. Cardiac pacemaker in situ   2. Chronic heart failure with preserved ejection fraction ([place])   3. Essential hypertension   4. Hematochezia      Plan:   In order of  problems listed above:  1. History of AV Block - She is s/p St. Jude PPM placement in 06/2017 and most recent device interrogation in 03/2021 showed normal device function. Was encouraged to keep scheduled remote checks and routine follow-up with Dr. Caryl Comes.   2. HFpHF - She does experience intermittent lower extremity edema and typically only takes her Torsemide 20m three times weekly due to frequent urination. We reviewed the importance of continuing to follow daily weights and that she could take an entire tablet if needed. Creatinine was stable at 0.89 in 02/2021. She was also recently started on Farxiga and we reviewed this can have a diuretic effect as well.   3. HTN - Her BP was initially recorded at 166/84, rechecked and improved to 152/78. She reports her BP has been well-controlled when  checked at home and I suspect her elevated readings today are due to her acute pain. I encouraged her to follow readings at home and report back if they remain elevated. Will continue her current medication regimen for now with Toprol-XL 12.528mdaily.   4. Abdominal Pain/Hematochezia - Repots worsening abdominal pain and cramping starting this morning with no fever or chills. She was already planning to go to the Emergency Dept after her visit today. We called over to the ED and no direct rooms available, therefore she will proceed to the waiting room.    Medication Adjustments/Labs and Tests Ordered: Current medicines are reviewed at length with the patient today.  Concerns regarding medicines are outlined above.  Medication changes, Labs and Tests ordered today are listed in the Patient Instructions below. Patient Instructions  Medication Instructions:   Continue current cardiac medications.   *If you need a refill on your cardiac medications before your next appointment, please call your pharmacy*   Follow-Up: [facility].  As part of our continuing mission to provide you with exceptional heart care, we have created designated Provider Care Teams.  These Care Teams include your primary Cardiologist (physician) and Advanced Practice Providers (APPs -  Physician Assistants and Nurse Practitioners) who all work together to provide you with the care you need, when you need it.  We recommend signing up for the patient portal called "MyChart".  Sign up information is provided on this After Visit Summary.  MyChart is used to connect with patients for Virtual Visits (Telemedicine).  Patients are able to view lab/test results, encounter notes, upcoming appointments, etc.  Non-urgent messages can be sent to your provider as well.   To learn more about what you can do with MyChart, go to [email or web address]   Your next appointment:   6  month(s)  The format for your next appointment:   In Person  Provider:   You may see SaRozann LeschesMD or one of the following Advanced Practice Providers on your designated Care Team:   BrBernerd PhoPA-C  MiErmalinda BarriosPA-C {     Signed, BrWaynetta Pean2/25/2023 9:12 AM    [address] [id number]: [phone]: [phone]

## 2021-06-12 ENCOUNTER — Emergency Department (HOSPITAL_COMMUNITY): Payer: Medicare Other

## 2021-06-12 ENCOUNTER — Encounter: Payer: Self-pay | Admitting: Student

## 2021-06-12 ENCOUNTER — Encounter (HOSPITAL_COMMUNITY): Payer: Self-pay

## 2021-06-12 ENCOUNTER — Emergency Department (HOSPITAL_COMMUNITY)
Admission: EM | Admit: 2021-06-12 | Discharge: 2021-06-12 | Disposition: A | Discharge status: Home / Self Care | Payer: Medicare Other | Attending: Emergency Medicine | Admitting: Emergency Medicine

## 2021-06-12 ENCOUNTER — Other Ambulatory Visit: Payer: Self-pay

## 2021-06-12 ENCOUNTER — Ambulatory Visit (INDEPENDENT_AMBULATORY_CARE_PROVIDER_SITE_OTHER): Payer: Medicare Other | Admitting: Student

## 2021-06-12 VITALS — BP 166/84 | HR 94 | Ht 65.0 in | Wt 219.0 lb

## 2021-06-12 DIAGNOSIS — R109 Unspecified abdominal pain: Secondary | ICD-10-CM | POA: Diagnosis not present

## 2021-06-12 DIAGNOSIS — I5032 Chronic diastolic (congestive) heart failure: Secondary | ICD-10-CM

## 2021-06-12 DIAGNOSIS — R197 Diarrhea, unspecified: Secondary | ICD-10-CM | POA: Insufficient documentation

## 2021-06-12 DIAGNOSIS — K5730 Diverticulosis of large intestine without perforation or abscess without bleeding: Secondary | ICD-10-CM | POA: Diagnosis not present

## 2021-06-12 DIAGNOSIS — K921 Melena: Secondary | ICD-10-CM | POA: Diagnosis not present

## 2021-06-12 DIAGNOSIS — Z950 Presence of cardiac pacemaker: Secondary | ICD-10-CM | POA: Diagnosis not present

## 2021-06-12 DIAGNOSIS — I10 Essential (primary) hypertension: Secondary | ICD-10-CM

## 2021-06-12 LAB — CBC WITH DIFFERENTIAL/PLATELET
Abs Immature Granulocytes: 0.05 10*3/uL (ref 0.00–0.07)
Basophils Absolute: 0 10*3/uL (ref 0.0–0.1)
Basophils Relative: 0 %
Eosinophils Absolute: 0.2 10*3/uL (ref 0.0–0.5)
Eosinophils Relative: 2 %
HCT: 43.9 % (ref 36.0–46.0)
Hemoglobin: 14.4 g/dL (ref 12.0–15.0)
Immature Granulocytes: 1 %
Lymphocytes Relative: 25 %
Lymphs Abs: 2.6 10*3/uL (ref 0.7–4.0)
MCH: 28.1 pg (ref 26.0–34.0)
MCHC: 32.8 g/dL (ref 30.0–36.0)
MCV: 85.6 fL (ref 80.0–100.0)
Monocytes Absolute: 0.9 10*3/uL (ref 0.1–1.0)
Monocytes Relative: 8 %
Neutro Abs: 6.7 10*3/uL (ref 1.7–7.7)
Neutrophils Relative %: 64 %
Platelets: 256 10*3/uL (ref 150–400)
RBC: 5.13 MIL/uL — ABNORMAL HIGH (ref 3.87–5.11)
RDW: 14.6 % (ref 11.5–15.5)
WBC: 10.4 10*3/uL (ref 4.0–10.5)
nRBC: 0 % (ref 0.0–0.2)

## 2021-06-12 LAB — URINALYSIS, MICROSCOPIC (REFLEX): RBC / HPF: NONE SEEN RBC/hpf (ref 0–5)

## 2021-06-12 LAB — COMPREHENSIVE METABOLIC PANEL
ALT: 22 U/L (ref 0–44)
AST: 23 U/L (ref 15–41)
Albumin: 3.8 g/dL (ref 3.5–5.0)
Alkaline Phosphatase: 46 U/L (ref 38–126)
Anion gap: 7 (ref 5–15)
BUN: 15 mg/dL (ref 8–23)
CO2: 27 mmol/L (ref 22–32)
Calcium: 9.4 mg/dL (ref 8.9–10.3)
Chloride: 108 mmol/L (ref 98–111)
Creatinine, Ser: 0.82 mg/dL (ref 0.44–1.00)
GFR, Estimated: >60 mL/min (ref >60)
Glucose, Bld: 88 mg/dL (ref 70–99)
Potassium: 4 mmol/L (ref 3.5–5.1)
Sodium: 142 mmol/L (ref 135–145)
Total Bilirubin: 0.7 mg/dL (ref 0.3–1.2)
Total Protein: 7.2 g/dL (ref 6.5–8.1)

## 2021-06-12 LAB — URINALYSIS, ROUTINE W REFLEX MICROSCOPIC
Bilirubin Urine: NEGATIVE
Glucose, UA: >=500 mg/dL — AB
Hgb urine dipstick: NEGATIVE
Ketones, ur: NEGATIVE mg/dL
Leukocytes,Ua: NEGATIVE
Nitrite: NEGATIVE
Protein, ur: NEGATIVE mg/dL
Specific Gravity, Urine: 1.02 (ref 1.005–1.030)
pH: 5.5 (ref 5.0–8.0)

## 2021-06-12 MED ORDER — METRONIDAZOLE 500 MG PO TABS
500.0000 mg | ORAL_TABLET | Freq: Once | ORAL | Status: AC
Start: 2021-06-12 — End: 2021-06-12
  Administered 2021-06-12: 500 mg via ORAL
  Filled 2021-06-12: qty 1

## 2021-06-12 MED ORDER — DICYCLOMINE HCL 20 MG PO TABS: 20.0000 mg | ORAL_TABLET | Freq: Two times a day (BID) | ORAL | 0 refills | Status: DC | PRN

## 2021-06-12 MED ORDER — CIPROFLOXACIN HCL 250 MG PO TABS
500.0000 mg | ORAL_TABLET | Freq: Once | ORAL | Status: AC
  Administered 2021-06-12: 500 mg via ORAL
  Filled 2021-06-12: qty 2

## 2021-06-12 MED ORDER — IOHEXOL 300 MG/ML  SOLN
100.0000 mL | Freq: Once | INTRAMUSCULAR | Status: AC | PRN
  Administered 2021-06-12: 100 mL via INTRAVENOUS

## 2021-06-12 MED ORDER — METRONIDAZOLE 500 MG PO TABS: 500.0000 mg | ORAL_TABLET | Freq: Three times a day (TID) | ORAL | 0 refills | Status: AC

## 2021-06-12 MED ORDER — CIPROFLOXACIN HCL 500 MG PO TABS: 500.0000 mg | ORAL_TABLET | Freq: Two times a day (BID) | ORAL | 0 refills | Status: AC

## 2021-06-12 NOTE — ED Triage Notes (Signed)
Pt to er, pt states that she has been having diarrhea off and on for the past three years, states that her pmd is changing her dm medications and her body is not adjusting well.  Pt states that recently she has been having 5 bm a day, states that she has been having some abd pain for the past three weeks.  Pt states that today she had some bright red blood in her last two bowel movements

## 2021-06-12 NOTE — ED Provider Triage Note (Signed)
Emergency Medicine Provider Triage Evaluation Note  Julie Clay , a 81 y.o. female  was evaluated in triage.  Pt complains of diarrhea and abdominal cramping.  Pt reports blood in stool today   Review of Systems  Positive: Cramping and pain Negative: fever  Physical Exam  BP (!) 170/92 (BP Location: Right Arm)    Pulse 89    Temp 97.8 F (36.6 C) (Oral)    Resp 15    Ht 5\' 5"  (1.651 m)    Wt 99.3 kg    SpO2 99%    BMI 36.44 kg/m  Gen:   Awake, no distress   Resp:  Normal effort  MSK:   Moves extremities without difficulty  Other:    Medical Decision Making  Medically screening exam initiated at 3:58 PM.  Appropriate orders placed.  Julie Clay was informed that the remainder of the evaluation will be completed by another provider, this initial triage assessment does not replace that evaluation, and the importance of remaining in the ED until their evaluation is complete.     Fransico Meadow, [place] 06/13/21 1500

## 2021-06-12 NOTE — ED Provider Notes (Signed)
[facility] EMERGENCY DEPARTMENT Provider Note   CSN: [id number] Arrival date & time: 06/12/21  1425     History  Chief Complaint  Patient presents with   Diarrhea    Julie Clay is a 81 y.o. female presented to ED with diarrhea ongoing for about a month.  She reports that it turned bloody 2 days ago which had her concerned.  She has cramping abdominal pain that comes and goes but associated with bowel movements.  She says her PCP ordered stool studies earlier in the month which were negative.  She does never had C. difficile.  Last antibiotic course may have been in December when she had COVID.  HPI     Home Medications Prior to Admission medications   Medication Sig Start Date End Date Taking? Authorizing Provider  ciprofloxacin (CIPRO) 500 MG tablet Take 1 tablet (500 mg total) by mouth 2 (two) times daily for 7 days. 06/12/21 06/19/21 Yes Trifan, Carola Rhine, MD  dicyclomine (BENTYL) 20 MG tablet Take 1 tablet (20 mg total) by mouth 2 (two) times daily as needed for up to 15 doses for spasms. 06/12/21  Yes Trifan, Carola Rhine, MD  metroNIDAZOLE (FLAGYL) 500 MG tablet Take 1 tablet (500 mg total) by mouth 3 (three) times daily for 7 days. 06/12/21 06/19/21 Yes Trifan, Carola Rhine, MD  acetaminophen (TYLENOL) 500 MG tablet Take 1,000 mg by mouth every 6 (six) hours as needed for moderate pain.    [provider]  albuterol (VENTOLIN HFA) 108 (90 Base) MCG/ACT inhaler Inhale 2 puffs into the lungs every 6 (six) hours as needed for wheezing or shortness of breath. 04/09/20   Alycia Rossetti, MD  Aloe Vera Leaf POWD Take 800 mg by mouth 2 (two) times daily.    [provider]  Ascorbic Acid (VITAMIN C) 1000 MG tablet Take 1,000 mg by mouth 2 (two) times daily.    [provider]  b complex vitamins tablet Take 1 tablet by mouth 2 (two) times daily.     [provider]  Blood Glucose Monitoring Suppl (BLOOD GLUCOSE SYSTEM PAK) KIT Use as directed to monitor  FSBS 1x daily. Dx: E11.9. Please dispense as Accu-Chek Aviva 01/18/18   [place], Modena Nunnery, MD  Calcium Polycarbophil (FIBER-CAPS PO) Take by mouth every other day.    [provider]  Cholecalciferol (VITAMIN D3) 2000 UNITS TABS Take 2,000 Units by mouth 2 (two) times daily.    [provider]  dapagliflozin propanediol (FARXIGA) 10 MG TABS tablet Take 1 tablet (10 mg total) by mouth daily before breakfast. 01/06/21   Noreene Larsson, NP  diclofenac Sodium (VOLTAREN) 1 % GEL Apply 2 g topically 4 (four) times daily. 11/19/20   Noreene Larsson, NP  Dulaglutide (TRULICITY) 1.5 QX/4.5WT SOPN Inject 1.5 mg into the skin once a week. 05/20/21   Lindell Spar, MD  fluocinonide cream (LIDEX) 8.88 % Apply 1 application topically 2 (two) times daily as needed (poison ivy/bites/hives). 08/14/18   [place], Modena Nunnery, MD  Ginkgo Biloba 120 MG CAPS Take 120 mg by mouth in the morning and at bedtime.     [provider]  glipiZIDE (GLUCOTROL) 10 MG tablet TAKE 1/2 (ONE-HALF) TABLET BY MOUTH TWICE DAILY BEFORE A MEAL FOR DIABETES 03/31/21   Noreene Larsson, NP  Glucose Blood (BLOOD GLUCOSE TEST STRIPS) STRP Use as directed to monitor FSBS 1x daily. Dx: E11.9. Please dispense as Accu-Chek Aviva 01/18/18   [name]  F, MD  guaiFENesin-dextromethorphan (ROBITUSSIN DM) 100-10 MG/5ML syrup Take 5 mLs by mouth every 4 (four) hours as needed for cough. 03/30/21   Noreene Larsson, NP  Javier Docker Oil 1000 MG CAPS Take 1,000 mg by mouth 2 (two) times daily.     [provider]  Lancets MISC Use as directed to monitor FSBS 1x daily. Dx: E11.9. Please dispense as Accu-Chek Aviva 01/18/18   [place], Modena Nunnery, MD  loperamide (IMODIUM A-D) 2 MG tablet Take 1 tablet (2 mg total) by mouth 4 (four) times daily as needed for diarrhea or loose stools. 04/23/21   [place], Dewaine Conger, FNP  LORazepam (ATIVAN) 0.5 MG tablet Take 1 tablet (0.5 mg total) by mouth 2 (two) times daily as needed for anxiety. 05/20/21    Lindell Spar, MD  metoprolol succinate (TOPROL-XL) 25 MG 24 hr tablet TAKE 1/2 (ONE-HALF) TABLET BY MOUTH ONCE DAILY . APPOINTMENT REQUIRED FOR FUTURE REFILLS 06/01/21   Erma Heritage, PA-C  Multiple Vitamin (MULTIVITAMIN WITH MINERALS) TABS tablet Take 1 tablet by mouth daily.    [provider]  naftifine (NAFTIN) 1 % cream Apply topically 2 (two) times daily as needed. 12/04/19   [place], Modena Nunnery, MD  rosuvastatin (CRESTOR) 5 MG tablet TAKE 1 TABLET BY MOUTH ONCE DAILY FOR CHOLESTEROL 05/05/21   Paseda, Dewaine Conger, FNP  simethicone (MYLICON) 80 MG chewable tablet Chew 80 mg by mouth as needed for flatulence.     [provider]  tiZANidine (ZANAFLEX) 4 MG tablet Take 1 tablet (4 mg total) by mouth every 6 (six) hours as needed for muscle spasms. 07/30/20   Noreene Larsson, NP  torsemide (DEMADEX) 20 MG tablet Take 20 mg by mouth 3 (three) times a week. 08/04/19   [provider]  UNABLE TO FIND Please dispense custom molded foot orthotics and heel spurs Dx: E11.30 09/30/20   Noreene Larsson, NP      Allergies    Arimidex [anastrozole], Other, Adhesive [tape], Lisinopril, Tetanus toxoids, and Tetracyclines & related    Review of Systems   Review of Systems  Physical Exam Updated Vital Signs BP (!) 162/63 (BP Location: Right Arm)    Pulse 72    Temp 97.8 F (36.6 C) (Oral)    Resp 18    Ht 5' 5" (1.651 m)    Wt 99.3 kg    SpO2 99%    BMI 36.44 kg/m  Physical Exam Constitutional:      General: She is not in acute distress. HENT:     Head: Normocephalic and atraumatic.  Eyes:     Conjunctiva/sclera: Conjunctivae normal.     Pupils: Pupils are equal, round, and reactive to light.  Cardiovascular:     Rate and Rhythm: Normal rate and regular rhythm.  Pulmonary:     Effort: Pulmonary effort is normal. No respiratory distress.  Abdominal:     General: There is no distension.     Tenderness: There is no abdominal tenderness.  Skin:    General: Skin is  warm and dry.  Neurological:     General: No focal deficit present.     Mental Status: She is alert. Mental status is at baseline.  Psychiatric:        Mood and Affect: Mood normal.        Behavior: Behavior normal.    ED Results / Procedures / Treatments   Labs (all labs ordered are listed, but only abnormal results are displayed) Labs Reviewed  CBC WITH DIFFERENTIAL/PLATELET - Abnormal; Notable for the following components:      Result Value   RBC 5.13 (*)    All other components within normal limits  URINALYSIS, ROUTINE W REFLEX MICROSCOPIC - Abnormal; Notable for the following components:   Glucose, UA >=500 (*)    All other components within normal limits  URINALYSIS, MICROSCOPIC (REFLEX) - Abnormal; Notable for the following components:   Bacteria, UA FEW (*)    Non Squamous Epithelial PRESENT (*)    All other components within normal limits  COMPREHENSIVE METABOLIC PANEL    EKG None  Radiology CT ABDOMEN PELVIS W CONTRAST  Result Date: 06/12/2021 CLINICAL DATA:  Abdominal pain, acute, nonlocalized EXAM: CT ABDOMEN AND PELVIS WITH CONTRAST TECHNIQUE: Multidetector CT imaging of the abdomen and pelvis was performed using the standard protocol following bolus administration of intravenous contrast. RADIATION DOSE REDUCTION: This exam was performed according to the departmental dose-optimization program which includes automated exposure control, adjustment of the mA and/or kV according to patient size and/or use of iterative reconstruction technique. CONTRAST:  13m OMNIPAQUE IOHEXOL 300 MG/ML  SOLN COMPARISON:  None. FINDINGS: Lower chest: Bibasilar atelectasis/scarring. Hepatobiliary: No focal liver abnormality is seen. No gallstones, gallbladder wall thickening, or biliary dilatation. Pancreas: Unremarkable. No pancreatic ductal dilatation or surrounding inflammatory changes. Spleen: Several small calcified granulomas.  Otherwise unremarkable. Adrenals/Urinary Tract: Adrenals  are unremarkable. Kidneys are unremarkable. Bladder is poorly distended. Stomach/Bowel: Stomach is within normal limits. Bowel is normal in caliber. Normal appendix. Distal colonic diverticulosis. Vascular/Lymphatic: Atherosclerosis.  No enlarged nodes. Reproductive: Uterus and bilateral adnexa are unremarkable. Other: No free fluid. Fat and mesenteric vessels within paraumbilical hernia. Musculoskeletal: Degenerative changes of the included spine. IMPRESSION: No acute abnormality. Colonic diverticulosis. Fat containing paraumbilical hernia. Electronically Signed   By: PMacy MisM.D.   On: 06/12/2021 18:13    Procedures Procedures    Medications Ordered in ED Medications  ciprofloxacin (CIPRO) tablet 500 mg (has no administration in time range)  metroNIDAZOLE (FLAGYL) tablet 500 mg (has no administration in time range)  iohexol (OMNIPAQUE) 300 MG/ML solution 100 mL (100 mLs Intravenous Contrast Given 06/12/21 1744)    ED Course/ Medical Decision Making/ A&P                           Medical Decision Making Risk Prescription drug management.   Cramping abdominal pain, differential diagnosis include diverticulitis versus colitis versus enteritis versus ureteral colic versus other  I personally ordered, reviewed and interpreted the patient's imaging and labs.  CT scan did not show evidence of diverticulitis but did note significant sigmoid diverticulosis.  Blood tests were within normal limits, no leukocytosis.   Because of her increasing bowel movements and now the blood in the bowels, I still think is reasonable to treat for diverticulitis or enteritis with ciprofloxacin and Flagyl.  We would prefer to avoid Augmentin as she already has cramping pain and diarrhea.  I also can prescribe Bentyl for cramping pain of the abdomen.  I have a lower suspicion for mesenteric ischemia, incarcerated hernia, AAA, or other emergent intra-abdominal surgical issues.  Supplemental history provided by  the patient's husband at the bedside.  Pain is fairly well controlled in the ED.  I think she is reasonably safe for discharge home on antibiotics.  Patient and her husband verbalized agreement the plan        Final Clinical Impression(s) / ED Diagnoses Final diagnoses:  Diarrhea, unspecified type  Rx / DC Orders ED Discharge Orders          Ordered    ciprofloxacin (CIPRO) 500 MG tablet  2 times daily        06/12/21 1851    metroNIDAZOLE (FLAGYL) 500 MG tablet  3 times daily        06/12/21 1851    dicyclomine (BENTYL) 20 MG tablet  2 times daily PRN        06/12/21 1851              Wyvonnia Dusky, MD 06/12/21 [id number]

## 2021-06-12 NOTE — Patient Instructions (Signed)
Medication Instructions:   Continue current cardiac medications.   *If you need a refill on your cardiac medications before your next appointment, please call your pharmacy*   Follow-Up: At [facility], you and your health needs are our priority.  As part of our continuing mission to provide you with exceptional heart care, we have created designated Provider Care Teams.  These Care Teams include your primary Cardiologist (physician) and Advanced Practice Providers (APPs -  Physician Assistants and Nurse Practitioners) who all work together to provide you with the care you need, when you need it.  We recommend signing up for the patient portal called "MyChart".  Sign up information is provided on this After Visit Summary.  MyChart is used to connect with patients for Virtual Visits (Telemedicine).  Patients are able to view lab/test results, encounter notes, upcoming appointments, etc.  Non-urgent messages can be sent to your provider as well.   To learn more about what you can do with MyChart, go to [email or web address].    Your next appointment:   6 month(s)  The format for your next appointment:   In Person  Provider:   You may see Rozann Lesches, MD or one of the following Advanced Practice Providers on your designated Care Team:   Bernerd Pho, PA-C  Ermalinda Barrios, [place] {

## 2021-06-13 ENCOUNTER — Encounter: Payer: Self-pay | Admitting: Student

## 2021-06-13 MED ORDER — METOPROLOL SUCCINATE ER 25 MG PO TB24: 12.5000 mg | ORAL_TABLET | Freq: Every day | ORAL | 3 refills | Status: DC

## 2021-06-15 DIAGNOSIS — Z20822 Contact with and (suspected) exposure to covid-19: Secondary | ICD-10-CM | POA: Diagnosis not present

## 2021-06-17 DIAGNOSIS — Z20822 Contact with and (suspected) exposure to covid-19: Secondary | ICD-10-CM | POA: Diagnosis not present

## 2021-06-23 ENCOUNTER — Ambulatory Visit (INDEPENDENT_AMBULATORY_CARE_PROVIDER_SITE_OTHER): Payer: Medicare Other

## 2021-06-23 DIAGNOSIS — I441 Atrioventricular block, second degree: Secondary | ICD-10-CM | POA: Diagnosis not present

## 2021-06-23 LAB — CUP PACEART REMOTE DEVICE CHECK
Battery Remaining Longevity: 60 mo
Battery Remaining Percentage: 59 %
Battery Voltage: 2.99 V
Brady Statistic AP VP Percent: 5.5 %
Brady Statistic AP VS Percent: 1 %
Brady Statistic AS VP Percent: 94 %
Brady Statistic AS VS Percent: 1 %
Brady Statistic RA Percent Paced: 5.3 %
Brady Statistic RV Percent Paced: 99 %
Date Time Interrogation Session: 20230307020014
Implantable Lead Implant Date: 20190306
Implantable Lead Implant Date: 20190306
Implantable Lead Location: 753859
Implantable Lead Location: 753860
Implantable Lead Model: 5076
Implantable Lead Model: 5076
Implantable Pulse Generator Implant Date: 20190306
Lead Channel Impedance Value: 400 Ohm
Lead Channel Impedance Value: 490 Ohm
Lead Channel Pacing Threshold Amplitude: 0.5 V
Lead Channel Pacing Threshold Amplitude: 0.75 V
Lead Channel Pacing Threshold Pulse Width: 0.5 ms
Lead Channel Pacing Threshold Pulse Width: 0.5 ms
Lead Channel Sensing Intrinsic Amplitude: 5 mV
Lead Channel Sensing Intrinsic Amplitude: 5.6 mV
Lead Channel Setting Pacing Amplitude: 2 V
Lead Channel Setting Pacing Amplitude: 2.5 V
Lead Channel Setting Pacing Pulse Width: 0.5 ms
Lead Channel Setting Sensing Sensitivity: 4 mV
Pulse Gen Model: 2272
Pulse Gen Serial Number: 9001027

## 2021-06-30 ENCOUNTER — Ambulatory Visit: Payer: Medicare Other | Admitting: Internal Medicine

## 2021-07-01 ENCOUNTER — Ambulatory Visit: Payer: Medicare Other | Admitting: Internal Medicine

## 2021-07-06 NOTE — Progress Notes (Signed)
Remote pacemaker transmission.   

## 2021-07-18 DIAGNOSIS — Z20822 Contact with and (suspected) exposure to covid-19: Secondary | ICD-10-CM | POA: Diagnosis not present

## 2021-07-19 DIAGNOSIS — Z20822 Contact with and (suspected) exposure to covid-19: Secondary | ICD-10-CM | POA: Diagnosis not present

## 2021-07-24 DIAGNOSIS — Z20822 Contact with and (suspected) exposure to covid-19: Secondary | ICD-10-CM | POA: Diagnosis not present

## 2021-07-27 ENCOUNTER — Ambulatory Visit (INDEPENDENT_AMBULATORY_CARE_PROVIDER_SITE_OTHER): Payer: Medicare Other | Admitting: Gastroenterology

## 2021-07-29 ENCOUNTER — Other Ambulatory Visit: Payer: Self-pay | Admitting: Nurse Practitioner

## 2021-07-30 ENCOUNTER — Telehealth: Payer: Self-pay | Admitting: *Deleted

## 2021-07-30 MED ORDER — TORSEMIDE 20 MG PO TABS: 20.0000 mg | ORAL_TABLET | ORAL | 3 refills | Status: DC

## 2021-08-03 DIAGNOSIS — Z20822 Contact with and (suspected) exposure to covid-19: Secondary | ICD-10-CM | POA: Diagnosis not present

## 2021-08-03 NOTE — Telephone Encounter (Signed)
Order placed for Torsemide 20 mg 3 times weekly  ?

## 2021-08-07 DIAGNOSIS — M1711 Unilateral primary osteoarthritis, right knee: Secondary | ICD-10-CM | POA: Diagnosis not present

## 2021-08-11 DIAGNOSIS — E1143 Type 2 diabetes mellitus with diabetic autonomic (poly)neuropathy: Secondary | ICD-10-CM | POA: Diagnosis not present

## 2021-08-12 LAB — HEMOGLOBIN A1C
Est. average glucose Bld gHb Est-mCnc: 137 mg/dL
Hgb A1c MFr Bld: 6.4 % — ABNORMAL HIGH (ref 4.8–5.6)

## 2021-08-12 LAB — CMP14+EGFR
ALT: 19 IU/L (ref 0–32)
AST: 23 IU/L (ref 0–40)
Albumin/Globulin Ratio: 1.7 (ref 1.2–2.2)
Albumin: 4.3 g/dL (ref 3.7–4.7)
Alkaline Phosphatase: 65 IU/L (ref 44–121)
BUN/Creatinine Ratio: 19 (ref 12–28)
BUN: 16 mg/dL (ref 8–27)
Bilirubin Total: 0.5 mg/dL (ref 0.0–1.2)
CO2: 23 mmol/L (ref 20–29)
Calcium: 9.8 mg/dL (ref 8.7–10.3)
Chloride: 108 mmol/L — ABNORMAL HIGH (ref 96–106)
Creatinine, Ser: 0.85 mg/dL (ref 0.57–1.00)
Globulin, Total: 2.5 g/dL (ref 1.5–4.5)
Glucose: 101 mg/dL — ABNORMAL HIGH (ref 70–99)
Potassium: 4.6 mmol/L (ref 3.5–5.2)
Sodium: 145 mmol/L — ABNORMAL HIGH (ref 134–144)
Total Protein: 6.8 g/dL (ref 6.0–8.5)
eGFR: 70 mL/min/{1.73_m2} (ref >59)

## 2021-08-12 LAB — CBC WITH DIFFERENTIAL/PLATELET
Basophils Absolute: 0 10*3/uL (ref 0.0–0.2)
Basos: 0 %
EOS (ABSOLUTE): 0.1 10*3/uL (ref 0.0–0.4)
Eos: 1 %
Hematocrit: 45.4 % (ref 34.0–46.6)
Hemoglobin: 15.3 g/dL (ref 11.1–15.9)
Immature Grans (Abs): 0 10*3/uL (ref 0.0–0.1)
Immature Granulocytes: 0 %
Lymphocytes Absolute: 3.2 10*3/uL — ABNORMAL HIGH (ref 0.7–3.1)
Lymphs: 31 %
MCH: 28 pg (ref 26.6–33.0)
MCHC: 33.7 g/dL (ref 31.5–35.7)
MCV: 83 fL (ref 79–97)
Monocytes Absolute: 0.8 10*3/uL (ref 0.1–0.9)
Monocytes: 8 %
Neutrophils Absolute: 6.1 10*3/uL (ref 1.4–7.0)
Neutrophils: 60 %
Platelets: 255 10*3/uL (ref 150–450)
RBC: 5.47 x10E6/uL — ABNORMAL HIGH (ref 3.77–5.28)
RDW: 13.1 % (ref 11.7–15.4)
WBC: 10.4 10*3/uL (ref 3.4–10.8)

## 2021-08-12 LAB — LIPID PANEL WITH LDL/HDL RATIO
Cholesterol, Total: 151 mg/dL (ref 100–199)
HDL: 57 mg/dL (ref >39)
LDL Chol Calc (NIH): 68 mg/dL (ref 0–99)
LDL/HDL Ratio: 1.2 ratio (ref 0.0–3.2)
Triglycerides: 156 mg/dL — ABNORMAL HIGH (ref 0–149)
VLDL Cholesterol Cal: 26 mg/dL (ref 5–40)

## 2021-08-17 ENCOUNTER — Ambulatory Visit (INDEPENDENT_AMBULATORY_CARE_PROVIDER_SITE_OTHER): Payer: Medicare Other | Admitting: Internal Medicine

## 2021-08-17 ENCOUNTER — Encounter: Payer: Self-pay | Admitting: Internal Medicine

## 2021-08-17 VITALS — BP 124/82 | HR 70 | Resp 18 | Ht 65.0 in | Wt 215.4 lb

## 2021-08-17 DIAGNOSIS — E1143 Type 2 diabetes mellitus with diabetic autonomic (poly)neuropathy: Secondary | ICD-10-CM

## 2021-08-17 DIAGNOSIS — I5032 Chronic diastolic (congestive) heart failure: Secondary | ICD-10-CM

## 2021-08-17 DIAGNOSIS — I10 Essential (primary) hypertension: Secondary | ICD-10-CM | POA: Diagnosis not present

## 2021-08-17 DIAGNOSIS — Z20822 Contact with and (suspected) exposure to covid-19: Secondary | ICD-10-CM | POA: Diagnosis not present

## 2021-08-17 DIAGNOSIS — F411 Generalized anxiety disorder: Secondary | ICD-10-CM

## 2021-08-17 DIAGNOSIS — R197 Diarrhea, unspecified: Secondary | ICD-10-CM

## 2021-08-17 MED ORDER — GLIPIZIDE ER 5 MG PO TB24: 5.0000 mg | ORAL_TABLET | Freq: Every day | ORAL | 1 refills | Status: DC

## 2021-08-17 MED ORDER — TRULICITY 1.5 MG/0.5ML ~~LOC~~ SOAJ: 1.5000 mg | SUBCUTANEOUS | 3 refills | Status: DC

## 2021-08-17 NOTE — Patient Instructions (Addendum)
Please take Glipizide ER 5 mg instead of 1/2 tablet of 10 mg. ? ?Please continue to take other medications as prescribed. ? ?Please continue to follow low carb diet and ambulate as tolerated. ?

## 2021-08-21 NOTE — Assessment & Plan Note (Signed)
BP Readings from Last 1 Encounters:  ?08/17/21 124/82  ? ?Well-controlled ?Counseled for compliance with the medications ?Advised DASH diet and moderate exercise/walking as tolerated ?

## 2021-08-21 NOTE — Assessment & Plan Note (Addendum)
Lab Results  ?Component Value Date  ? HGBA1C 6.4 (H) 08/11/2021  ? ? ?On glipizide 5 mg twice daily, Farxiga 10 mg QD and Trulicity 1.5 mg qw ?Decreased glipizide to 5 mg daily to avoid hypoglycemia, prescribed XL form ?Advised to follow diabetic diet ?On statin ?F/u CMP and lipid panel ?Diabetic eye exam: Advised to follow up with Ophthalmology for diabetic eye exam ? ?

## 2021-08-21 NOTE — Assessment & Plan Note (Signed)
Takes Ativan occasionally for severe anxiety, refilled ?

## 2021-08-21 NOTE — Assessment & Plan Note (Signed)
Appears euvolemic currently ?Takes Demadex every other day ?On Farxiga ?Followed by Cardiology ?

## 2021-08-21 NOTE — Assessment & Plan Note (Addendum)
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

## 2021-08-21 NOTE — Progress Notes (Signed)
? ?Established Patient Office Visit ? ?Subjective:  ?Patient ID: Julie Clay, female    DOB: Nov 21, 1940  Age: 81 y.o. MRN: [id number] ? ?CC:  ?Chief Complaint  ?Patient presents with  ? Follow-up  ?  3 month follow up DM and chronic diarrhea she wants to discuss medications for DM she has gastro appt but this was changed to 5/18  ? ? ?HPI ?Julie Clay is a 81 y.o. female with past medical history of HTN, asthma, type II DM with neuropathy, GERD, chronic diarrhea, breast ca. and GAD who presents for f/u of her chronic medical conditions. ? ?Chronic diarrhea: She has been having diarrhea, which is chronic and intermittent.  She has seen GI in the past, who recommended Imodium as needed for diarrhea, which has helped her.  She denies any melena or hematochezia currently.  Denies any fever or chills. ? ?Type II DM: She has been taking glipizide, Farxiga and Trulicity.  Her diarrhea has slightly improved since stopping metformin.  Denies any polyuria or polydipsia currently.  She takes Crestor for HLD. ? ?She has history of Mobitz 2 second-degree heart block, s/p pacemaker placement and HFpEF. BP is well-controlled. Takes medications regularly. Patient denies headache, dizziness, chest pain, dyspnea or palpitations. ? ?Past Medical History:  ?Diagnosis Date  ? 2nd degree AV block 06/22/2017  ? Arthritis   ? Asthma   ? Bone spur   ? Breast cancer, left breast ([place])   ? 2012  ? Breast cancer, right breast ([place])   ? 2021  ? Chronic bronchitis ([place])   ? COPD (chronic obstructive pulmonary disease) ([place])   ? Diabetes mellitus without complication ([place])   ? Phreesia 04/08/2020  ? Diastolic heart failure ([place])   ? Essential hypertension   ? Fatty liver 2013  ? GERD (gastroesophageal reflux disease)   ? HSV (herpes simplex virus) infection   ? Hyperlipidemia   ? Lung nodule seen on imaging study 2013  ? Lymphedema   ? Left arm  ? Mobitz II   ? a. s/p STJ dual chamber PPM   ? Osteoarthritis   ? both knees, lower back, both  shoulders; bone spurs to feet  ? Rectocele 06/25/2013  ? Posterior repair 11/06/13   ? Type II diabetes mellitus ([place])   ? Ventral hernia   ? ? ?Past Surgical History:  ?Procedure Laterality Date  ? BREAST BIOPSY Left 04/2011  ? BREAST BIOPSY Left 06/22/13  ? BREAST BIOPSY Left 09/12/2013  ? Procedure: BREAST BIOPSY WITH NEEDLE LOCALIZATION;  Surgeon: Jamesetta So, MD;  Location: [facility];  Service: General;  Laterality: Left;  ? BREAST LUMPECTOMY Left 04/2011 X 2  ? BREAST SURGERY N/A   ? Phreesia 01/17/2020  ? CATARACT EXTRACTION W/ INTRAOCULAR LENS  IMPLANT, BILATERAL Bilateral   ? CATARACT EXTRACTION, BILATERAL    ? on different occassions   ? [place]  ? COLON SURGERY N/A   ? 2014  ? DILATION AND CURETTAGE OF UTERUS  11/06/2013  ? HAND LIGAMENT RECONSTRUCTION Right ~ 2010  ? HYSTEROSCOPY WITH D & C N/A 11/06/2013  ? Procedure: DILATATION AND CURETTAGE /HYSTEROSCOPY;  Surgeon: Jonnie Kind, MD;  Location: [facility];  Service: Gynecology;  Laterality: N/A;  ? INSERT / REPLACE / REMOVE PACEMAKER  06/22/2017  ? LUNG LOBECTOMY Right 1988  ? Fungal Infection  ? PACEMAKER IMPLANT N/A 06/22/2017  ? Procedure: PACEMAKER IMPLANT;  Surgeon: Deboraha Sprang, MD;  Location: [place] [facility];  Service: Cardiovascular;  Laterality: N/A;  ? PARTIAL MASTECTOMY WITH NEEDLE LOCALIZATION Right 01/02/2020  ? Procedure: RIGHT PARTIAL MASTECTOMY AFTER NEEDLE LOCALIZATION;  Surgeon: Aviva Signs, MD;  Location: [facility];  Service: General;  Laterality: Right;  ? POLYPECTOMY N/A 11/06/2013  ? Procedure: POLYPECTOMY (REMOVAL ENDOMETRIAL POLYP);  Surgeon: Jonnie Kind, MD;  Location: [facility];  Service: Gynecology;  Laterality: N/A;  ? RECTOCELE REPAIR N/A 11/06/2013  ? Procedure: POSTERIOR REPAIR (RECTOCELE);  Surgeon: Jonnie Kind, MD;  Location: [facility];  Service: Gynecology;  Laterality: N/A;  ? SHOULDER ARTHROSCOPY WITH OPEN ROTATOR CUFF REPAIR Left 07/26/2012  ? TONSILLECTOMY  1949  ? TUBAL LIGATION    ? ? ?Family History   ?Problem Relation Age of Onset  ? Hypertension Mother   ? Heart disease Mother   ? Diabetes Mother   ? Kidney disease Mother   ?     ESRD  ? Cancer Mother   ?     Bladder  ? Hypertension Father   ? Hyperlipidemia Father   ? Heart disease Father   ? Diabetes Father   ? Cancer Father   ?     Stomach Cancer  ? Diabetes Sister   ? Heart disease Sister   ? Alzheimer's disease Paternal Aunt   ? Stroke Maternal Grandfather   ? Cancer Paternal Grandfather   ?     stomach  ? Cancer Maternal Uncle   ?     prostate  ? Cancer Paternal Uncle   ?     stomach  ? ? ?Social History  ? ?Socioeconomic History  ? Marital status: Married  ?  Spouse name: Tommi Rumps  ? Number of children: 2  ? Years of education: 94  ? Highest education level: Some college, no degree  ?Occupational History  ? Occupation: retired   ?  Comment: Product manager for [employer]  ?Tobacco Use  ? Smoking status: Never  ? Smokeless tobacco: Never  ?Vaping Use  ? Vaping Use: Never used  ?Substance and Sexual Activity  ? Alcohol use: Yes  ?  Comment: 06/22/2017 "might have a drink twice a year"  ? Drug use: No  ? Sexual activity: Not Currently  ?  Birth control/protection: Surgical  ?Other Topics Concern  ? Not on file  ?Social History Narrative  ? Not on file  ? ?Social Determinants of Health  ? ?Financial Resource Strain: Low Risk   ? Difficulty of Paying Living Expenses: Not hard at all  ?Food Insecurity: No Food Insecurity  ? Worried About Charity fundraiser in the Last Year: Never true  ? Ran Out of Food in the Last Year: Never true  ?Transportation Needs: No Transportation Needs  ? Lack of Transportation (Medical): No  ? Lack of Transportation (Non-Medical): No  ?Physical Activity: Inactive  ? Days of Exercise per Week: 0 days  ? Minutes of Exercise per Session: 0 min  ?Stress: No Stress Concern Present  ? Feeling of Stress : Not at all  ?Social Connections: Socially Integrated  ? Frequency of Communication with Friends and Family: More than three times a  week  ? Frequency of Social Gatherings with Friends and Family: Once a week  ? Attends Religious Services: More than 4 times per year  ? Active Member of Clubs or Organizations: Yes  ? Attends Archivist Meetings: More than 4 times per year  ? Marital Status: Married  ?Intimate Partner Violence: Not At Risk  ? Fear  of Current or Ex-Partner: No  ? Emotionally Abused: No  ? Physically Abused: No  ? Sexually Abused: No  ? ? ?Outpatient Medications Prior to Visit  ?Medication Sig Dispense Refill  ? acetaminophen (TYLENOL) 500 MG tablet Take 1,000 mg by mouth every 6 (six) hours as needed for moderate pain.    ? albuterol (VENTOLIN HFA) 108 (90 Base) MCG/ACT inhaler Inhale 2 puffs into the lungs every 6 (six) hours as needed for wheezing or shortness of breath. 8 g 0  ? Aloe Vera Leaf POWD Take 800 mg by mouth 2 (two) times daily.    ? Ascorbic Acid (VITAMIN C) 1000 MG tablet Take 1,000 mg by mouth 2 (two) times daily.    ? b complex vitamins tablet Take 1 tablet by mouth 2 (two) times daily.     ? Blood Glucose Monitoring Suppl (BLOOD GLUCOSE SYSTEM PAK) KIT Use as directed to monitor FSBS 1x daily. Dx: E11.9. Please dispense as Accu-Chek Aviva 1 each 1  ? Calcium Polycarbophil (FIBER-CAPS PO) Take by mouth every other day.    ? Cholecalciferol (VITAMIN D3) 2000 UNITS TABS Take 2,000 Units by mouth 2 (two) times daily.    ? dapagliflozin propanediol (FARXIGA) 10 MG TABS tablet Take 1 tablet (10 mg total) by mouth daily before breakfast. 90 tablet 1  ? diclofenac Sodium (VOLTAREN) 1 % GEL Apply 2 g topically 4 (four) times daily. 100 g 1  ? dicyclomine (BENTYL) 20 MG tablet Take 1 tablet (20 mg total) by mouth 2 (two) times daily as needed for up to 15 doses for spasms. 15 tablet 0  ? fluocinonide cream (LIDEX) 0.71 % Apply 1 application topically 2 (two) times daily as needed (poison ivy/bites/hives). 30 g 11  ? Ginkgo Biloba 120 MG CAPS Take 120 mg by mouth in the morning and at bedtime.     ? Glucose Blood  (BLOOD GLUCOSE TEST STRIPS) STRP Use as directed to monitor FSBS 1x daily. Dx: E11.9. Please dispense as Accu-Chek Aviva 100 each 1  ? Krill Oil 1000 MG CAPS Take 1,000 mg by mouth 2 (two) times daily.     ? [place]

## 2021-08-24 DIAGNOSIS — Z20822 Contact with and (suspected) exposure to covid-19: Secondary | ICD-10-CM | POA: Diagnosis not present

## 2021-09-01 ENCOUNTER — Ambulatory Visit (INDEPENDENT_AMBULATORY_CARE_PROVIDER_SITE_OTHER): Payer: Medicare Other | Admitting: Gastroenterology

## 2021-09-01 ENCOUNTER — Encounter (INDEPENDENT_AMBULATORY_CARE_PROVIDER_SITE_OTHER): Payer: Self-pay | Admitting: Gastroenterology

## 2021-09-01 VITALS — BP 143/79 | HR 82 | Temp 97.9°F | Ht 65.0 in | Wt 219.8 lb

## 2021-09-01 DIAGNOSIS — K582 Mixed irritable bowel syndrome: Secondary | ICD-10-CM | POA: Insufficient documentation

## 2021-09-01 DIAGNOSIS — K589 Irritable bowel syndrome without diarrhea: Secondary | ICD-10-CM | POA: Insufficient documentation

## 2021-09-01 NOTE — Patient Instructions (Signed)
I suspect that your symptoms are related to IBS ?Please keep a food journal to correlate symptoms and foods you are eating for 1-2 weeks ?Please start either benefiber or metamucil 4g each night, make sure you are drinking plenty of water ?I am providing the low FODMAP diet for you to look at as it can be helpful in pinpointing certain foods that can worsen symptoms ?Please continue with your probiotic ? ?Follow up 3 months ?

## 2021-09-01 NOTE — Progress Notes (Signed)
? ?Referring Provider: Lindell Spar, MD ?Primary Care Physician:  Lindell Spar, MD ?Primary GI Physician:  ? ?Chief Complaint  ?Patient presents with  ? Diarrhea  ?  Patient here today with complaints of diarrhea that alternates with constipation. History of rectal bleeding on Feb 24,2023 went to [facility] for this.   ? ?HPI:   ?Julie Clay is a 81 y.o. female with past medical history of 2nd degree Av block, arthritis, asthma, Breast cancer, COPD, DM, HF, HTN, Fatty liver, GERD, HSV, HLD,  ? ?Patient presenting today for diarrhea and constipation.  ? ?GI path panel done 05/21/21 negative. Recent ER visit Jun 12 2021 for diarrhea and rectal bleeding, CT A/P w contrast at that time with colonic diverticulosis and fat containing paraumbilica hernia, no acute abnormalities. Patient sent home with Cipro, flagyl and bentyl from ED. Labs done 08/11/21 relatively unremarkable, sodium very mildly elevated at 145, blood counts all WNL as were other electrolytes.   ? ?Patient states that she has been alternating between diarrhea and constipation for "many years." She states that she will often have cramping and gas. She can take simethicone which seems to help with her cramping. She cannot pinpoint how often she seems to have diarrhea vs constipation. She may have 6-7 BMs per day and sometimes she may have none. She states that in February when she went to the ED, she had rectal bleeding and severe abdominal pain. States that she completed abx and felt that symptoms went back to baseline. She denies black stools. Occasional toilet tissue hematochezia. When she is constipated she takes fiber but she is unsure how much to take. States that she is unsure if bentyl really provided any relief. Tends to have more pain in her LLQ, especially with the passage of gas as she had tubal ligation many years ago and is unsure if pain is related to this. Has tried miralax in the past during times of constipation, but she had a  lot of acid reflux from this. Cannot seem to pinpoint if certain foods seem to cause her to have more diarrhea. Uses imodium as needed when she has multiple episodes of diarrhea but this usually causes bad constipation, thereafter. She has to wear depends if she goes out of the house as she has some fecal urgency/incontinence during times of worse diarrhea, rarely has nocturnal fecal incontinence. Abdominal discomfort improves with having a BM. Denies any issues with nausea or vomiting. She reports occasional  heartburn that she uses baking soda and water for. Pcp gave her nexium but she did not prefer to take PPI due to risk of osteoporosis, cannot pinpoint how often she requires intervention for her heartburn. Husband states he notices more gas/stomach upset if she eats dairy, specifically ice cream, though patient does not notice this. Is currently on a probiotic but unsure if it is providing any results. Feels that symptoms improved some after metformin was stopped.  ? ?NSAID use: none ?Social hx: no tobacco or etoh  ?Fam hx: no CRC, father had stomach cancer, paternal uncle and grandfather all had stomach cancer.  ? ?Last Colonoscopy:prior to 2013, unsure of findings, ?[facility] ?Last Endoscopy: >10 years ago  ? ?Past Medical History:  ?Diagnosis Date  ? 2nd degree AV block 06/22/2017  ? Arthritis   ? Asthma   ? Bone spur   ? Breast cancer, left breast ([place])   ? 2012  ? Breast cancer, right breast ([place])   ? 2021  ?  Chronic bronchitis ([place])   ? COPD (chronic obstructive pulmonary disease) ([place])   ? Diabetes mellitus without complication ([place])   ? Phreesia 04/08/2020  ? Diastolic heart failure ([place])   ? Essential hypertension   ? Fatty liver 2013  ? GERD (gastroesophageal reflux disease)   ? HSV (herpes simplex virus) infection   ? Hyperlipidemia   ? Lung nodule seen on imaging study 2013  ? Lymphedema   ? Left arm  ? Mobitz II   ? a. s/p STJ dual chamber PPM   ? Osteoarthritis   ? both knees, lower back, both  shoulders; bone spurs to feet  ? Rectocele 06/25/2013  ? Posterior repair 11/06/13   ? Type II diabetes mellitus ([place])   ? Ventral hernia   ? ? ?Past Surgical History:  ?Procedure Laterality Date  ? BREAST BIOPSY Left 04/2011  ? BREAST BIOPSY Left 06/22/13  ? BREAST BIOPSY Left 09/12/2013  ? Procedure: BREAST BIOPSY WITH NEEDLE LOCALIZATION;  Surgeon: Jamesetta So, MD;  Location: [facility];  Service: General;  Laterality: Left;  ? BREAST LUMPECTOMY Left 04/2011 X 2  ? BREAST SURGERY N/A   ? Phreesia 01/17/2020  ? CATARACT EXTRACTION W/ INTRAOCULAR LENS  IMPLANT, BILATERAL Bilateral   ? CATARACT EXTRACTION, BILATERAL    ? on different occassions   ? [place]  ? COLON SURGERY N/A   ? 2014  ? DILATION AND CURETTAGE OF UTERUS  11/06/2013  ? HAND LIGAMENT RECONSTRUCTION Right ~ 2010  ? HYSTEROSCOPY WITH D & C N/A 11/06/2013  ? Procedure: DILATATION AND CURETTAGE /HYSTEROSCOPY;  Surgeon: Jonnie Kind, MD;  Location: [facility];  Service: Gynecology;  Laterality: N/A;  ? INSERT / REPLACE / REMOVE PACEMAKER  06/22/2017  ? LUNG LOBECTOMY Right 1988  ? Fungal Infection  ? PACEMAKER IMPLANT N/A 06/22/2017  ? Procedure: PACEMAKER IMPLANT;  Surgeon: Deboraha Sprang, MD;  Location: [place] [facility];  Service: Cardiovascular;  Laterality: N/A;  ? PARTIAL MASTECTOMY WITH NEEDLE LOCALIZATION Right 01/02/2020  ? Procedure: RIGHT PARTIAL MASTECTOMY AFTER NEEDLE LOCALIZATION;  Surgeon: Aviva Signs, MD;  Location: [facility];  Service: General;  Laterality: Right;  ? POLYPECTOMY N/A 11/06/2013  ? Procedure: POLYPECTOMY (REMOVAL ENDOMETRIAL POLYP);  Surgeon: Jonnie Kind, MD;  Location: [facility];  Service: Gynecology;  Laterality: N/A;  ? RECTOCELE REPAIR N/A 11/06/2013  ? Procedure: POSTERIOR REPAIR (RECTOCELE);  Surgeon: Jonnie Kind, MD;  Location: [facility];  Service: Gynecology;  Laterality: N/A;  ? SHOULDER ARTHROSCOPY WITH OPEN ROTATOR CUFF REPAIR Left 07/26/2012  ? TONSILLECTOMY  1949  ? TUBAL LIGATION    ? ? ?Current Outpatient  Medications  ?Medication Sig Dispense Refill  ? acetaminophen (TYLENOL) 500 MG tablet Take 1,000 mg by mouth every 6 (six) hours as needed for moderate pain.    ? albuterol (VENTOLIN HFA) 108 (90 Base) MCG/ACT inhaler Inhale 2 puffs into the lungs every 6 (six) hours as needed for wheezing or shortness of breath. 8 g 0  ? Aloe Vera Leaf POWD Take 800 mg by mouth 2 (two) times daily.    ? Ascorbic Acid (VITAMIN C) 1000 MG tablet Take 1,000 mg by mouth 2 (two) times daily.    ? b complex vitamins tablet Take 1 tablet by mouth 2 (two) times daily.     ? Blood Glucose Monitoring Suppl (BLOOD GLUCOSE SYSTEM PAK) KIT Use as directed to monitor FSBS 1x daily. Dx: E11.9. Please dispense as Accu-Chek Aviva 1 each  1  ? Calcium Polycarbophil (FIBER-CAPS PO) Take by mouth every other day. Prn    ? Cholecalciferol (VITAMIN D3) 2000 UNITS TABS Take 2,000 Units by mouth 2 (two) times daily.    ? dapagliflozin propanediol (FARXIGA) 10 MG TABS tablet Take 1 tablet (10 mg total) by mouth daily before breakfast. 90 tablet 1  ? diclofenac Sodium (VOLTAREN) 1 % GEL Apply 2 g topically 4 (four) times daily. (Patient taking differently: Apply 2 g topically 4 (four) times daily. prn) 100 g 1  ? Dulaglutide (TRULICITY) 1.5 OV/7.0HE SOPN Inject 1.5 mg into the skin once a week. 6 mL 3  ? fluocinonide cream (LIDEX) 0.35 % Apply 1 application topically 2 (two) times daily as needed (poison ivy/bites/hives). 30 g 11  ? Ginkgo Biloba 120 MG CAPS Take 120 mg by mouth in the morning and at bedtime.     ? glipiZIDE (GLUCOTROL XL) 5 MG 24 hr tablet Take 1 tablet (5 mg total) by mouth daily with breakfast. 90 tablet 1  ? Glucose Blood (BLOOD GLUCOSE TEST STRIPS) STRP Use as directed to monitor FSBS 1x daily. Dx: E11.9. Please dispense as Accu-Chek Aviva 100 each 1  ? Krill Oil 1000 MG CAPS Take 1,000 mg by mouth 2 (two) times daily.     ? Lancets MISC Use as directed to monitor FSBS 1x daily. Dx: E11.9. Please dispense as Accu-Chek Aviva 100 each 1   ? loperamide (IMODIUM A-D) 2 MG tablet Take 1 tablet (2 mg total) by mouth 4 (four) times daily as needed for diarrhea or loose stools. 30 tablet 1  ? LORazepam (ATIVAN) 0.5 MG tablet Take 1 tablet (0.5 mg tot

## 2021-09-23 ENCOUNTER — Ambulatory Visit (INDEPENDENT_AMBULATORY_CARE_PROVIDER_SITE_OTHER): Payer: Medicare Other

## 2021-09-23 DIAGNOSIS — I441 Atrioventricular block, second degree: Secondary | ICD-10-CM

## 2021-09-25 LAB — CUP PACEART REMOTE DEVICE CHECK
Battery Remaining Longevity: 55 mo
Battery Remaining Percentage: 56 %
Battery Voltage: 2.99 V
Brady Statistic AP VP Percent: 6.8 %
Brady Statistic AP VS Percent: 1 %
Brady Statistic AS VP Percent: 93 %
Brady Statistic AS VS Percent: 1 %
Brady Statistic RA Percent Paced: 6.6 %
Brady Statistic RV Percent Paced: 99 %
Date Time Interrogation Session: 20230606031002
Implantable Lead Implant Date: 20190306
Implantable Lead Implant Date: 20190306
Implantable Lead Location: 753859
Implantable Lead Location: 753860
Implantable Lead Model: 5076
Implantable Lead Model: 5076
Implantable Pulse Generator Implant Date: 20190306
Lead Channel Impedance Value: 340 Ohm
Lead Channel Impedance Value: 460 Ohm
Lead Channel Pacing Threshold Amplitude: 0.5 V
Lead Channel Pacing Threshold Amplitude: 0.75 V
Lead Channel Pacing Threshold Pulse Width: 0.5 ms
Lead Channel Pacing Threshold Pulse Width: 0.5 ms
Lead Channel Sensing Intrinsic Amplitude: 3.7 mV
Lead Channel Sensing Intrinsic Amplitude: 5.6 mV
Lead Channel Setting Pacing Amplitude: 2 V
Lead Channel Setting Pacing Amplitude: 2.5 V
Lead Channel Setting Pacing Pulse Width: 0.5 ms
Lead Channel Setting Sensing Sensitivity: 4 mV
Pulse Gen Model: 2272
Pulse Gen Serial Number: 9001027

## 2021-10-02 NOTE — Progress Notes (Signed)
Remote pacemaker transmission.   

## 2021-10-07 ENCOUNTER — Other Ambulatory Visit (HOSPITAL_COMMUNITY): Payer: Self-pay | Admitting: Hematology

## 2021-10-07 DIAGNOSIS — Z9889 Other specified postprocedural states: Secondary | ICD-10-CM

## 2021-10-27 ENCOUNTER — Other Ambulatory Visit (HOSPITAL_COMMUNITY): Payer: Self-pay | Admitting: Hematology

## 2021-10-27 ENCOUNTER — Ambulatory Visit (HOSPITAL_COMMUNITY)
Admission: RE | Admit: 2021-10-27 | Discharge: 2021-10-27 | Disposition: A | Discharge status: Home / Self Care | Payer: Medicare Other | Source: Ambulatory Visit | Attending: Hematology | Admitting: Hematology

## 2021-10-27 ENCOUNTER — Encounter (HOSPITAL_COMMUNITY): Payer: Self-pay

## 2021-10-27 DIAGNOSIS — Z9889 Other specified postprocedural states: Secondary | ICD-10-CM

## 2021-10-27 DIAGNOSIS — Z853 Personal history of malignant neoplasm of breast: Secondary | ICD-10-CM | POA: Diagnosis not present

## 2021-10-27 DIAGNOSIS — R928 Other abnormal and inconclusive findings on diagnostic imaging of breast: Secondary | ICD-10-CM | POA: Diagnosis not present

## 2021-10-30 ENCOUNTER — Other Ambulatory Visit: Payer: Self-pay | Admitting: Internal Medicine

## 2021-11-11 ENCOUNTER — Encounter: Payer: Self-pay | Admitting: Cardiology

## 2021-11-11 ENCOUNTER — Ambulatory Visit (INDEPENDENT_AMBULATORY_CARE_PROVIDER_SITE_OTHER): Payer: Medicare Other | Admitting: Cardiology

## 2021-11-11 VITALS — BP 130/72 | HR 73 | Ht 65.0 in | Wt 224.0 lb

## 2021-11-11 DIAGNOSIS — E782 Mixed hyperlipidemia: Secondary | ICD-10-CM

## 2021-11-11 DIAGNOSIS — I5032 Chronic diastolic (congestive) heart failure: Secondary | ICD-10-CM

## 2021-11-11 NOTE — Patient Instructions (Signed)
Medication Instructions:  Your physician recommends that you continue on your current medications as directed. Please refer to the Current Medication list given to you today.   Labwork: None today  Testing/Procedures: None today  Follow-Up: 6 months  Any Other Special Instructions Will Be Listed Below (If Applicable).  If you need a refill on your cardiac medications before your next appointment, please call your pharmacy.  

## 2021-11-11 NOTE — Progress Notes (Signed)
Cardiology Office Note  Date: 11/11/2021   ID: Julie Clay, Julie Clay 12-07-1940, MRN [id number]  PCP:  Julie Spar, MD  Cardiologist:  Julie Lesches, MD Electrophysiologist:  Julie Axe, MD   Chief Complaint  Patient presents with   Cardiac follow-up    History of Present Illness: Julie Clay is a 81 y.o. female last seen in February by Ms. Strader PA-C.  She is here for a follow-up visit.  Reports reasonable control of fluid status with every other day use of Demadex.  She has had modification of her medicines as well, currently on fark [employer] which she is tolerating.  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 Caryl Clay.  Device check in June showed normal function.  Echocardiogram from April of last year revealed LVEF 55 to 60% with mild diastolic dysfunction and normal RV contraction.  I personally reviewed her ECG today which shows a ventricular paced rhythm with atrial tracking.  Also went over her lab work from April.  Renal function and potassium were normal at that time.  Past Medical History:  Diagnosis Date   2nd degree AV block 06/22/2017   Arthritis    Asthma    Bone spur    Breast cancer, left breast ([place])    2012   Breast cancer, right breast ([place])    2021   Chronic bronchitis ([facility])    COPD (chronic obstructive pulmonary disease) ([place])    Diabetes mellitus without complication ([place])    Phreesia 25/36/6440   Diastolic heart failure ([place])    Essential hypertension    Fatty liver 2013   GERD (gastroesophageal reflux disease)    HSV (herpes simplex virus) infection    Hyperlipidemia    Lung nodule seen on imaging study 2013   Lymphedema    Left arm   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    Osteoarthritis    both knees, lower back, both shoulders; bone spurs to feet   Rectocele 06/25/2013   Posterior repair 11/06/13    Type II diabetes mellitus ([place])    Ventral hernia     Past Surgical History:  Procedure Laterality Date   BREAST BIOPSY  Left 04/2011   BREAST BIOPSY Left 06/22/13   BREAST BIOPSY Left 09/12/2013   Procedure: BREAST BIOPSY WITH NEEDLE LOCALIZATION;  Surgeon: Jamesetta So, MD;  Location: [facility];  Service: General;  Laterality: Left;   BREAST LUMPECTOMY Left 04/2011 X 2   BREAST SURGERY N/A    Phreesia 01/17/2020   CATARACT EXTRACTION W/ INTRAOCULAR LENS  IMPLANT, BILATERAL Bilateral    CATARACT EXTRACTION, BILATERAL     on different occassions    [place]   COLON SURGERY N/A    2014   DILATION AND CURETTAGE OF UTERUS  11/06/2013   HAND LIGAMENT RECONSTRUCTION Right ~ 2010   HYSTEROSCOPY WITH D & C N/A 11/06/2013   Procedure: DILATATION AND CURETTAGE /HYSTEROSCOPY;  Surgeon: Jonnie Kind, MD;  Location: [facility];  Service: Gynecology;  Laterality: N/A;   INSERT / REPLACE / REMOVE PACEMAKER  06/22/2017   LUNG LOBECTOMY Right 1988   Fungal Infection   PACEMAKER IMPLANT N/A 06/22/2017   Procedure: PACEMAKER IMPLANT;  Surgeon: Deboraha Sprang, MD;  Location: [place] [facility];  Service: Cardiovascular;  Laterality: N/A;   PARTIAL MASTECTOMY WITH NEEDLE LOCALIZATION Right 01/02/2020   Procedure: RIGHT PARTIAL MASTECTOMY AFTER NEEDLE LOCALIZATION;  Surgeon: Aviva Signs, MD;  [facility];  Service: General;  Laterality: Right;   POLYPECTOMY N/A 11/06/2013   Procedure: POLYPECTOMY (REMOVAL ENDOMETRIAL POLYP);  Surgeon: Jonnie Kind, MD;  Location: [facility];  Service: Gynecology;  Laterality: N/A;   RECTOCELE REPAIR N/A 11/06/2013   Procedure: POSTERIOR REPAIR (RECTOCELE);  Surgeon: Jonnie Kind, MD;  Location: [facility];  Service: Gynecology;  Laterality: N/A;   SHOULDER ARTHROSCOPY WITH OPEN ROTATOR CUFF REPAIR Left 07/26/2012   TONSILLECTOMY  1949   TUBAL LIGATION      Current Outpatient Medications  Medication Sig Dispense Refill   acetaminophen (TYLENOL) 500 MG tablet Take 1,000 mg by mouth every 6 (six) hours as needed for moderate pain.     albuterol (VENTOLIN HFA) 108 (90 Base) MCG/ACT  inhaler Inhale 2 puffs into the lungs every 6 (six) hours as needed for wheezing or shortness of breath. 8 g 0   Aloe Vera Leaf POWD Take 800 mg by mouth 2 (two) times daily.     Ascorbic Acid (VITAMIN C) 1000 MG tablet Take 1,000 mg by mouth 2 (two) times daily.     b complex vitamins tablet Take 1 tablet by mouth 2 (two) times daily.      Blood Glucose Monitoring Suppl (BLOOD GLUCOSE SYSTEM PAK) KIT Use as directed to monitor FSBS 1x daily. Dx: E11.9. Please dispense as Accu-Chek Aviva 1 each 1   Calcium Polycarbophil (FIBER-CAPS PO) Take by mouth every other day. Prn     Cholecalciferol (VITAMIN D3) 2000 UNITS TABS Take 2,000 Units by mouth 2 (two) times daily.     dapagliflozin propanediol (FARXIGA) 10 MG TABS tablet Take 1 tablet (10 mg total) by mouth daily before breakfast. 90 tablet 1   diclofenac Sodium (VOLTAREN) 1 % GEL Apply 2 g topically 4 (four) times daily. (Patient taking differently: Apply 2 g topically 4 (four) times daily. prn) 100 g 1   dicyclomine (BENTYL) 20 MG tablet Take 1 tablet (20 mg total) by mouth 2 (two) times daily as needed for up to 15 doses for spasms. 15 tablet 0   Dulaglutide (TRULICITY) 1.5 DQ/2.2WL SOPN Inject 1.5 mg into the skin once a week. 6 mL 3   fluocinonide cream (LIDEX) 7.98 % Apply 1 application topically 2 (two) times daily as needed (poison ivy/bites/hives). 30 g 11   Ginkgo Biloba 120 MG CAPS Take 120 mg by mouth in the morning and at bedtime.      glipiZIDE (GLUCOTROL XL) 5 MG 24 hr tablet Take 1 tablet (5 mg total) by mouth daily with breakfast. 90 tablet 1   Glucose Blood (BLOOD GLUCOSE TEST STRIPS) STRP Use as directed to monitor FSBS 1x daily. Dx: E11.9. Please dispense as Accu-Chek Aviva 100 each 1   Krill Oil 1000 MG CAPS Take 1,000 mg by mouth 2 (two) times daily.      Lancets MISC Use as directed to monitor FSBS 1x daily. Dx: E11.9. Please dispense as Accu-Chek Aviva 100 each 1   loperamide (IMODIUM A-D) 2 MG tablet Take 1 tablet (2 mg  total) by mouth 4 (four) times daily as needed for diarrhea or loose stools. 30 tablet 1   LORazepam (ATIVAN) 0.5 MG tablet Take 1 tablet (0.5 mg total) by mouth 2 (two) times daily as needed for anxiety. 15 tablet 0   metoprolol succinate (TOPROL-XL) 25 MG 24 hr tablet Take 0.5 tablets (12.5 mg total) by mouth daily. 45 tablet 3   Multiple Vitamin (MULTIVITAMIN WITH MINERALS) TABS tablet Take 1 tablet  by mouth daily.     naftifine (NAFTIN) 1 % cream Apply topically 2 (two) times daily as needed. 60 g 2   rosuvastatin (CRESTOR) 5 MG tablet TAKE 1 TABLET BY MOUTH ONCE DAILY FOR CHOLESTEROL 90 tablet 0   simethicone (MYLICON) 80 MG chewable tablet Chew 80 mg by mouth as needed for flatulence.      tiZANidine (ZANAFLEX) 4 MG tablet Take 1 tablet (4 mg total) by mouth every 6 (six) hours as needed for muscle spasms. (Patient taking differently: Take 4 mg by mouth every 6 (six) hours as needed for muscle spasms. 2 mg QHS) 180 tablet 1   torsemide (DEMADEX) 20 MG tablet Take 1 tablet (20 mg total) by mouth 3 (three) times a week. 40 tablet 3   UNABLE TO FIND Please dispense custom molded foot orthotics and heel spurs Dx: E11.30 1 each 0   No current facility-administered medications for this visit.   Allergies:  Arimidex [anastrozole], Other, Adhesive [tape], Lisinopril, Tetanus toxoids, and Tetracyclines & related   ROS: No syncope.  Physical Exam: VS:  BP 130/72   Pulse 73   Ht $R'5\' 5"'vi$  (1.651 m)   Wt 224 lb (101.6 kg)   SpO2 97%   BMI 37.28 kg/m , BMI Body mass index is 37.28 kg/m.  Wt Readings from Last 3 Encounters:  11/11/21 224 lb (101.6 kg)  09/01/21 219 lb 12.8 oz (99.7 kg)  08/17/21 215 lb 6.4 oz (97.7 kg)    General: Patient appears comfortable at rest. HEENT: Conjunctiva and lids normal, oropharynx clear. Neck: Supple, no elevated JVP or carotid bruits, no thyromegaly. Lungs: Clear to auscultation, nonlabored breathing at rest. Cardiac: Regular rate and rhythm, no S3 or  significant systolic murmur, no pericardial rub. Extremities: No pitting edema.  ECG:  An ECG dated 11/06/2020 was personally reviewed today and demonstrated:  Ventricular pacing with atrial tracking.  Recent Labwork: 08/11/2021: ALT 19; AST 23; BUN 16; Creatinine, Ser 0.85; Hemoglobin 15.3; Platelets 255; Potassium 4.6; Sodium 145     Component Value Date/Time   CHOL 151 08/11/2021 1022   TRIG 156 (H) 08/11/2021 1022   HDL 57 08/11/2021 1022   CHOLHDL 2.7 04/08/2020 0925   VLDL 43 (H) 09/20/2016 1015   LDLCALC 68 08/11/2021 1022   LDLCALC 53 04/08/2020 0925   LDLDIRECT 102 (H) 12/09/2011 1035    Other Studies Reviewed Today:  Echocardiogram 07/21/2020:  1. Left ventricular ejection fraction, by estimation, is 55 to 60%. The  left ventricle has normal function. The left ventricle has no regional  wall motion abnormalities. There is mild left ventricular hypertrophy.  Left ventricular diastolic parameters  are consistent with Grade I diastolic dysfunction (impaired relaxation).   2. Right ventricular systolic function is normal. The right ventricular  size is normal.   3. Left atrial size was mildly dilated.   4. The mitral valve is normal in structure. No evidence of mitral valve  regurgitation. No evidence of mitral stenosis. Moderate mitral annular  calcification.   5. The aortic valve is tricuspid. There is mild calcification of the  aortic valve. There is mild thickening of the aortic valve. Aortic valve  regurgitation is not visualized.   6. The inferior vena cava is normal in size with greater than 50%  respiratory variability, suggesting right atrial pressure of 3 mmHg.   Assessment and Plan:  1.  HFpEF with LVEF 55 to 12%, mild diastolic dysfunction and normal RV contraction as of April 2022.  Plan to  continue current Demadex dosing as well as [place].  States that she has good diuresis with her current diuretic plan and adjust this as necessary.  Renal function potassium  were normal in April.  2.  Symptomatic second-degree type II heart block status post St. Jude pacemaker with continued follow-up with Dr. Caryl Clay.  3.  Mixed hyperlipidemia, continuing on Crestor.  Last LDL 68.  Medication Adjustments/Labs and Tests Ordered: Current medicines are reviewed at length with the patient today.  Concerns regarding medicines are outlined above.   Tests Ordered: Orders Placed This Encounter  Procedures   EKG 12-Lead    Medication Changes: No orders of the defined types were placed in this encounter.   Disposition:  Follow up  6 months.  Signed, Satira Sark, MD, [facility] 11/11/2021 4:45 PM    [facility] [address] [place], [place] [address] Phone: [phone]; Fax: [phone]

## 2021-11-24 ENCOUNTER — Encounter (INDEPENDENT_AMBULATORY_CARE_PROVIDER_SITE_OTHER): Payer: Self-pay | Admitting: Gastroenterology

## 2021-12-07 ENCOUNTER — Ambulatory Visit (INDEPENDENT_AMBULATORY_CARE_PROVIDER_SITE_OTHER): Payer: Medicare Other | Admitting: Gastroenterology

## 2021-12-11 ENCOUNTER — Ambulatory Visit (INDEPENDENT_AMBULATORY_CARE_PROVIDER_SITE_OTHER): Payer: Medicare PPO | Admitting: Gastroenterology

## 2021-12-11 ENCOUNTER — Encounter (INDEPENDENT_AMBULATORY_CARE_PROVIDER_SITE_OTHER): Payer: Self-pay | Admitting: Gastroenterology

## 2021-12-11 VITALS — BP 144/80 | HR 84 | Temp 98.1°F | Ht 65.0 in | Wt 226.0 lb

## 2021-12-11 DIAGNOSIS — K582 Mixed irritable bowel syndrome: Secondary | ICD-10-CM | POA: Diagnosis not present

## 2021-12-11 NOTE — Progress Notes (Signed)
Referring Provider: Lindell Spar, MD Primary Care Physician:  Lindell Spar, MD Primary GI Physician: Jenetta Downer  Chief Complaint  Patient presents with   Follow-up    Patient here today for a follow up visit. She is still having issues with her IBS with constipation and diarrhea. She has simethicone prn as she has a lot of gas. She takes fiber two po qhs. She takes imodium prn.   HPI:   Julie Clay is a 81 y.o. female with past medical history of  2nd degree Av block, arthritis, asthma, Breast cancer, COPD, DM, HF, HTN, Fatty liver, GERD, HSV, HLD,    Patient presenting today for follow up of IBS.  Last seen 09/01/21.  Previous GI path panel done 05/21/21 negative. Recent ER visit Jun 12 2021 for diarrhea and rectal bleeding, CT A/P w contrast at that time with colonic diverticulosis and fat containing paraumbilica hernia, no acute abnormalities. Patient sent home with Cipro, flagyl and bentyl from ED. Labs done 08/11/21 relatively unremarkable, sodium very mildly elevated at 145, blood counts all WNL as were other electrolytes.     At time of last visit, Patient alternating between diarrhea and constipation for "many years." She states that she will often have cramping and gas. She can take simethicone which seems to help with her cramping.  She may have 6-7 BMs per day and sometimes she may have none. She states that in February when she went to the ED, she had rectal bleeding and severe abdominal pain. States that she completed abx and felt that symptoms went back to baseline. She denies black stools. Occasional toilet tissue hematochezia. Taking fiber for constipation. unsure if bentyl really provided any relief. Tends to have more pain in her LLQ, especially with the passage of gas. Has tried miralax in the past during times of constipation, but she had a lot of acid reflux from this. Cannot seem to pinpoint if certain foods seem to cause her to have more diarrhea. Uses imodium as  needed when she has multiple episodes of diarrhea but this usually causes bad constipation, thereafter. She has to wear depends if she goes out of the house as she has some fecal urgency/incontinence during times of worse diarrhea, rarely has nocturnal fecal incontinence. Abdominal discomfort improves with having a BM.  She reports occasional  heartburn that she uses baking soda and water for. Pcp gave her nexium but she did not prefer to take PPI due to risk of osteoporosis. Husband states he notices more gas/stomach upset if she eats dairy, specifically ice cream, though patient does not notice this. Is currently on a probiotic but unsure if it is providing any results. Feels that symptoms improved some after metformin was stopped.   Recommended low FODMAP diet, continue probiotic, food journal x1-2 weeks, benefiber nightly, avoid trigger foods. Pt declined updating colonoscopy at that time.  Present:  Patient reports she is doing some better with implementation of fiber, taking 2 fiber capsules at night. Still having some intermittent diarrhea and constipation, has intermittent abdominal pain and gas. She reports that the first month she took fiber she had almost no loose stools. Having some maybe 2-3x/week now. She is taking simethicone for her gas and tries to take this prior to it starting as if she waits, it does not work. Denies rectal bleeding or melena. She notes she stopped metformin about 4 months ago. Feels that she is eating more often but still not eating large meals. Notes that  stools may be somewhat improved since stopping metformin. Drinking maybe 2 glasses of water per day and green tea. She looked at low FODMAP food guide but she is not willing to go gluten free, dairy free or vegan so she was not able to really stick to it. She has noted that onions tend to cause her worse symptoms as well as tomato sauces. Still having 5-6 BMs per day some days and sometimes none. Takes imodium PRN, only  taken this twice since her last visit. She reports only one good BM since her last visit.   Only having heartburn from tomato based foods, she uses baking soda for this which helps, maybe once per week.   Last Colonoscopy:prior to 2013, unsure of findings, ?Ivar Bury, was told she had adhesions to her bowel from having tubal ligation Last Endoscopy: >10 years ago   Past Medical History:  Diagnosis Date   2nd degree AV block 06/22/2017   Arthritis    Asthma    Bone spur    Breast cancer, left breast ([place])    2012   Breast cancer, right breast ([place])    2021   Chronic bronchitis ([place])    COPD (chronic obstructive pulmonary disease) ([place])    Diabetes mellitus without complication ([place])    Phreesia 54/49/2010   Diastolic heart failure ([place])    Essential hypertension    Fatty liver 2013   GERD (gastroesophageal reflux disease)    HSV (herpes simplex virus) infection    Hyperlipidemia    Lung nodule seen on imaging study 2013   Lymphedema    Left arm   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    Osteoarthritis    both knees, lower back, both shoulders; bone spurs to feet   Rectocele 06/25/2013   Posterior repair 11/06/13    Type II diabetes mellitus ([place])    Ventral hernia     Past Surgical History:  Procedure Laterality Date   BREAST BIOPSY Left 04/2011   BREAST BIOPSY Left 06/22/13   BREAST BIOPSY Left 09/12/2013   Procedure: BREAST BIOPSY WITH NEEDLE LOCALIZATION;  Surgeon: Jamesetta So, MD;  Location: [facility];  Service: General;  Laterality: Left;   BREAST LUMPECTOMY Left 04/2011 X 2   BREAST SURGERY N/A    Phreesia 01/17/2020   CATARACT EXTRACTION W/ INTRAOCULAR LENS  IMPLANT, BILATERAL Bilateral    CATARACT EXTRACTION, BILATERAL     on different occassions    CESAREAN [place]   COLON SURGERY N/A    2014   DILATION AND CURETTAGE OF UTERUS  11/06/2013   HAND LIGAMENT RECONSTRUCTION Right ~ 2010   HYSTEROSCOPY WITH D & C N/A 11/06/2013   Procedure: DILATATION AND  CURETTAGE /HYSTEROSCOPY;  Surgeon: Jonnie Kind, MD;  Location: [facility];  Service: Gynecology;  Laterality: N/A;   INSERT / REPLACE / REMOVE PACEMAKER  06/22/2017   LUNG LOBECTOMY Right 1988   Fungal Infection   PACEMAKER IMPLANT N/A 06/22/2017   Procedure: PACEMAKER IMPLANT;  Surgeon: Deboraha Sprang, MD;  Location: [facility];  Service: Cardiovascular;  Laterality: N/A;   PARTIAL MASTECTOMY WITH NEEDLE LOCALIZATION Right 01/02/2020   Procedure: RIGHT PARTIAL MASTECTOMY AFTER NEEDLE LOCALIZATION;  Surgeon: Aviva Signs, MD;  Location: [facility];  Service: General;  Laterality: Right;   POLYPECTOMY N/A 11/06/2013   Procedure: POLYPECTOMY (REMOVAL ENDOMETRIAL POLYP);  Surgeon: Jonnie Kind, MD;  Location: [facility];  Service: Gynecology;  Laterality: N/A;  RECTOCELE REPAIR N/A 11/06/2013   Procedure: POSTERIOR REPAIR (RECTOCELE);  Surgeon: Jonnie Kind, MD;  Location: [facility];  Service: Gynecology;  Laterality: N/A;   SHOULDER ARTHROSCOPY WITH OPEN ROTATOR CUFF REPAIR Left 07/26/2012   TONSILLECTOMY  1949   TUBAL LIGATION      Current Outpatient Medications  Medication Sig Dispense Refill   acetaminophen (TYLENOL) 500 MG tablet Take 1,000 mg by mouth every 6 (six) hours as needed for moderate pain.     albuterol (VENTOLIN HFA) 108 (90 Base) MCG/ACT inhaler Inhale 2 puffs into the lungs every 6 (six) hours as needed for wheezing or shortness of breath. 8 g 0   Aloe Vera Leaf POWD Take 800 mg by mouth 2 (two) times daily.     Ascorbic Acid (VITAMIN C) 1000 MG tablet Take 1,000 mg by mouth 2 (two) times daily.     b complex vitamins tablet Take 1 tablet by mouth 2 (two) times daily.      Blood Glucose Monitoring Suppl (BLOOD GLUCOSE SYSTEM PAK) KIT Use as directed to monitor FSBS 1x daily. Dx: E11.9. Please dispense as Accu-Chek Aviva 1 each 1   Calcium Polycarbophil (FIBER-CAPS PO) Take by mouth every other day. Prn     Cholecalciferol (VITAMIN D3) 2000 UNITS TABS Take 2,000 Units by mouth  2 (two) times daily.     dapagliflozin propanediol (FARXIGA) 10 MG TABS tablet Take 1 tablet (10 mg total) by mouth daily before breakfast. 90 tablet 1   diclofenac Sodium (VOLTAREN) 1 % GEL Apply 2 g topically 4 (four) times daily. (Patient taking differently: Apply 2 g topically 4 (four) times daily. prn) 100 g 1   Dulaglutide (TRULICITY) 1.5 ZW/2.5EN SOPN Inject 1.5 mg into the skin once a week. 6 mL 3   fluocinonide cream (LIDEX) 2.77 % Apply 1 application topically 2 (two) times daily as needed (poison ivy/bites/hives). 30 g 11   Ginkgo Biloba 120 MG CAPS Take 120 mg by mouth in the morning and at bedtime.      glipiZIDE (GLUCOTROL XL) 5 MG 24 hr tablet Take 1 tablet (5 mg total) by mouth daily with breakfast. 90 tablet 1   Glucose Blood (BLOOD GLUCOSE TEST STRIPS) STRP Use as directed to monitor FSBS 1x daily. Dx: E11.9. Please dispense as Accu-Chek Aviva 100 each 1   Krill Oil 1000 MG CAPS Take 1,000 mg by mouth 2 (two) times daily.      Lancets MISC Use as directed to monitor FSBS 1x daily. Dx: E11.9. Please dispense as Accu-Chek Aviva 100 each 1   loperamide (IMODIUM A-D) 2 MG tablet Take 1 tablet (2 mg total) by mouth 4 (four) times daily as needed for diarrhea or loose stools. 30 tablet 1   LORazepam (ATIVAN) 0.5 MG tablet Take 1 tablet (0.5 mg total) by mouth 2 (two) times daily as needed for anxiety. 15 tablet 0   metoprolol succinate (TOPROL-XL) 25 MG 24 hr tablet Take 0.5 tablets (12.5 mg total) by mouth daily. 45 tablet 3   Multiple Vitamin (MULTIVITAMIN WITH MINERALS) TABS tablet Take 1 tablet by mouth daily.     naftifine (NAFTIN) 1 % cream Apply topically 2 (two) times daily as needed. 60 g 2   rosuvastatin (CRESTOR) 5 MG tablet TAKE 1 TABLET BY MOUTH ONCE DAILY FOR CHOLESTEROL 90 tablet 0   simethicone (MYLICON) 80 MG chewable tablet Chew 80 mg by mouth as needed for flatulence.      tiZANidine (ZANAFLEX) 4 MG tablet Take  1 tablet (4 mg total) by mouth every 6 (six) hours as  needed for muscle spasms. (Patient taking differently: Take 4 mg by mouth every 6 (six) hours as needed for muscle spasms. 2 mg QHS) 180 tablet 1   torsemide (DEMADEX) 20 MG tablet Take 1 tablet (20 mg total) by mouth 3 (three) times a week. 40 tablet 3   UNABLE TO FIND Please dispense custom molded foot orthotics and heel spurs Dx: E11.30 1 each 0   No current facility-administered medications for this visit.    Allergies as of 12/11/2021 - Review Complete 12/11/2021  Allergen Reaction Noted   Arimidex [anastrozole] Other (See Comments) 04/18/2013   Other Hives and Itching 01/17/2020   Adhesive [tape] Other (See Comments) 10/08/2011   Lisinopril  06/15/2012   Tetanus toxoids  04/25/2018   Tetracyclines & related Other (See Comments) 10/08/2011    Family History  Problem Relation Age of Onset   Hypertension Mother    Heart disease Mother    Diabetes Mother    Kidney disease Mother        ESRD   Cancer Mother        Bladder   Hypertension Father    Hyperlipidemia Father    Heart disease Father    Diabetes Father    Cancer Father        Stomach Cancer   Diabetes Sister    Heart disease Sister    Alzheimer's disease Paternal Aunt    Stroke Maternal Grandfather    Cancer Paternal Grandfather        stomach   Cancer Maternal Uncle        prostate   Cancer Paternal Uncle        stomach    Social History   Socioeconomic History   Marital status: Married    Spouse name: Tommi Rumps   Number of children: 2   Years of education: 12   Highest education level: Some college, no degree  Occupational History   Occupation: retired     Comment: Product manager for [employer]  Tobacco Use   Smoking status: Never   Smokeless tobacco: Never  Vaping Use   Vaping Use: Never used  Substance and Sexual Activity   Alcohol use: Not Currently    Comment: 06/22/2017 "might have a drink twice a year"   Drug use: No   Sexual activity: Not Currently    Birth control/protection:  Surgical  Other Topics Concern   Not on file  Social History Narrative   Not on file   Social Determinants of Health   Financial Resource Strain: Low Risk  (04/21/2021)   Overall Financial Resource Strain (CARDIA)    Difficulty of Paying Living Expenses: Not hard at all  Food Insecurity: No Food Insecurity (04/21/2021)   Hunger Vital Sign    Worried About Running Out of Food in the Last Year: Never true    Ran Out of Food in the Last Year: Never true  Transportation Needs: No Transportation Needs (04/21/2021)   PRAPARE - Hydrologist (Medical): No    Lack of Transportation (Non-Medical): No  Physical Activity: Inactive (04/21/2021)   Exercise Vital Sign    Days of Exercise per Week: 0 days    Minutes of Exercise per Session: 0 min  Stress: No Stress Concern Present (04/21/2021)   [place]    Feeling of Stress : Not at all  Social  Connections: Socially Integrated (04/21/2021)   Social Connection and Isolation Panel [NHANES]    Frequency of Communication with Friends and Family: More than three times a week    Frequency of Social Gatherings with Friends and Family: Once a week    Attends Religious Services: More than 4 times per year    Active Member of [employer] or Organizations: Yes    Attends Music therapist: More than 4 times per year    Marital Status: Married   Review of systems General: negative for malaise, night sweats, fever, chills, weight loss Neck: Negative for lumps, goiter, pain and significant neck swelling Resp: Negative for cough, wheezing, dyspnea at rest CV: Negative for chest pain, leg swelling, palpitations, orthopnea GI: denies melena, hematochezia, nausea, vomiting, dysphagia, odyonophagia, early satiety or unintentional weight loss. +loose stools/+constipation +gas MSK: Negative for joint pain or swelling, back pain, and muscle pain. Derm: Negative for itching  or rash Psych: Denies depression, anxiety, memory loss, confusion. No homicidal or suicidal ideation.  Heme: Negative for prolonged bleeding, bruising easily, and swollen nodes. Endocrine: Negative for cold or heat intolerance, polyuria, polydipsia and goiter. Neuro: negative for tremor, gait imbalance, syncope and seizures. The remainder of the review of systems is noncontributory.  Physical Exam: BP (!) 144/80 (BP Location: Left Arm, Patient Position: Sitting, Cuff Size: Large)   Pulse 84   Temp 98.1 F (36.7 C) (Oral)   Ht _0  (1.651 m)   Wt 226 lb (102.5 kg)   BMI 37.61 kg/m  General:   Alert and oriented. No distress noted. Pleasant and cooperative.  Head:  Normocephalic and atraumatic. Eyes:  Conjuctiva clear without scleral icterus. Mouth:  Oral mucosa pink and moist. Good dentition. No lesions. Heart: Normal rate and rhythm, s1 and s2 heart sounds present.  Lungs: Clear lung sounds in all lobes. Respirations equal and unlabored. Abdomen:  +BS, soft, non-tender and non-distended. No rebound or guarding. No HSM or masses noted. Derm: No palmar erythema or jaundice Msk:  Symmetrical without gross deformities. Normal posture. Extremities:  Without edema. Neurologic:  Alert and  oriented x4 Psych:  Alert and cooperative. Normal mood and affect.  Invalid input(s): "6 MONTHS"   ASSESSMENT: Julie Clay is a 81 y.o. female presenting today for follow up of IBS-M.  Doing some better since last visit with implementation of fiber. Still having mix of both harder stools and looser stools though looser stools 2-3x/week now. Having continued gas with certain foods, advised her to try taking her simethicone prior to gas causing foods to see if this helps. She is drinking a decent amount of PO fluids throughout the day, recommended she add another fiber pill in the morning and make sure she is keeping with ample PO fluid intake to avoid this causing harder stools. Should continue to  avoid trigger foods. She again declines to update screening colonoscopy. No red flag symptoms. Patient denies melena, hematochezia, nausea, vomiting, dysphagia, odyonophagia, early satiety or weight loss. Will make me aware of new or worsening symptoms.   PLAN:  Continue to avoid trigger foods  2.  Take simethicone prior to trigger foods  3. Increase fiber to 1 in the morning and 2 in the evening 4. Increase fluid intake  All questions were answered, patient verbalized understanding and is in agreement with plan as outlined above.    Follow Up: 1 year  Dayra Rapley L. Alver Sorrow, MSN, APRN, AGNP-C Adult-Gerontology Nurse Practitioner [facility]

## 2021-12-11 NOTE — Patient Instructions (Signed)
Please continue with plenty of fluids/water You can try adding another fiber pill in the morning, as discussed the fiber helps to pull liquid into the colon to help regulate your stools and can cause harder stools without the right amount of fluid intake Continue to avoid trigger foods If you have any new or worsening symptoms, please let me know  Follow up 1 year

## 2021-12-15 ENCOUNTER — Other Ambulatory Visit: Payer: Self-pay | Admitting: *Deleted

## 2021-12-15 MED ORDER — DAPAGLIFLOZIN PROPANEDIOL 10 MG PO TABS: 10.0000 mg | ORAL_TABLET | Freq: Every day | ORAL | 1 refills | Status: DC

## 2021-12-22 LAB — CUP PACEART REMOTE DEVICE CHECK
Battery Remaining Longevity: 53 mo
Battery Remaining Percentage: 53 %
Battery Voltage: 2.98 V
Brady Statistic AP VP Percent: 6.6 %
Brady Statistic AP VS Percent: 1 %
Brady Statistic AS VP Percent: 93 %
Brady Statistic AS VS Percent: 1 %
Brady Statistic RA Percent Paced: 6.4 %
Brady Statistic RV Percent Paced: 99 %
Date Time Interrogation Session: 20230905020013
Implantable Lead Implant Date: 20190306
Implantable Lead Implant Date: 20190306
Implantable Lead Location: 753859
Implantable Lead Location: 753860
Implantable Lead Model: 5076
Implantable Lead Model: 5076
Implantable Pulse Generator Implant Date: 20190306
Lead Channel Impedance Value: 350 Ohm
Lead Channel Impedance Value: 480 Ohm
Lead Channel Pacing Threshold Amplitude: 0.5 V
Lead Channel Pacing Threshold Amplitude: 0.75 V
Lead Channel Pacing Threshold Pulse Width: 0.5 ms
Lead Channel Pacing Threshold Pulse Width: 0.5 ms
Lead Channel Sensing Intrinsic Amplitude: 4 mV
Lead Channel Sensing Intrinsic Amplitude: 5.6 mV
Lead Channel Setting Pacing Amplitude: 2 V
Lead Channel Setting Pacing Amplitude: 2.5 V
Lead Channel Setting Pacing Pulse Width: 0.5 ms
Lead Channel Setting Sensing Sensitivity: 4 mV
Pulse Gen Model: 2272
Pulse Gen Serial Number: 9001027

## 2021-12-23 ENCOUNTER — Ambulatory Visit (INDEPENDENT_AMBULATORY_CARE_PROVIDER_SITE_OTHER): Payer: Medicare PPO

## 2021-12-23 ENCOUNTER — Ambulatory Visit (INDEPENDENT_AMBULATORY_CARE_PROVIDER_SITE_OTHER): Payer: Medicare PPO | Admitting: Internal Medicine

## 2021-12-23 ENCOUNTER — Encounter: Payer: Self-pay | Admitting: Internal Medicine

## 2021-12-23 VITALS — BP 136/62 | HR 72 | Resp 18 | Ht 65.0 in | Wt 222.0 lb

## 2021-12-23 DIAGNOSIS — F411 Generalized anxiety disorder: Secondary | ICD-10-CM | POA: Diagnosis not present

## 2021-12-23 DIAGNOSIS — I441 Atrioventricular block, second degree: Secondary | ICD-10-CM

## 2021-12-23 DIAGNOSIS — Z23 Encounter for immunization: Secondary | ICD-10-CM

## 2021-12-23 DIAGNOSIS — I10 Essential (primary) hypertension: Secondary | ICD-10-CM | POA: Diagnosis not present

## 2021-12-23 DIAGNOSIS — L570 Actinic keratosis: Secondary | ICD-10-CM

## 2021-12-23 DIAGNOSIS — E782 Mixed hyperlipidemia: Secondary | ICD-10-CM

## 2021-12-23 DIAGNOSIS — K582 Mixed irritable bowel syndrome: Secondary | ICD-10-CM

## 2021-12-23 DIAGNOSIS — E559 Vitamin D deficiency, unspecified: Secondary | ICD-10-CM

## 2021-12-23 DIAGNOSIS — E1143 Type 2 diabetes mellitus with diabetic autonomic (poly)neuropathy: Secondary | ICD-10-CM

## 2021-12-23 DIAGNOSIS — I5032 Chronic diastolic (congestive) heart failure: Secondary | ICD-10-CM | POA: Diagnosis not present

## 2021-12-23 LAB — POCT GLYCOSYLATED HEMOGLOBIN (HGB A1C)
HbA1c POC (<> result, manual entry): 6.5 % (ref 4.0–5.6)
HbA1c, POC (controlled diabetic range): 6.5 % (ref 0.0–7.0)

## 2021-12-23 NOTE — Assessment & Plan Note (Signed)
Takes Ativan occasionally for severe anxiety, refilled

## 2021-12-23 NOTE — Assessment & Plan Note (Addendum)
Lab Results  Component Value Date   HGBA1C 6.4 (H) 08/11/2021   Well-controlled On glipizide 5 mg QD, Farxiga 10 mg QD and Trulicity 1.5 mg qw Advised to follow diabetic diet On statin F/u CMP and lipid panel Diabetic eye exam: Advised to follow up with Ophthalmology for diabetic eye exam

## 2021-12-23 NOTE — Assessment & Plan Note (Signed)
On Crestor

## 2021-12-23 NOTE — Assessment & Plan Note (Signed)
BP Readings from Last 1 Encounters:  12/23/21 136/62   Well-controlled Counseled for compliance with the medications Advised DASH diet and moderate exercise/walking as tolerated

## 2021-12-23 NOTE — Assessment & Plan Note (Signed)
Chronic, could be related to IBS-D - somewhat improved since stopping Metformin Imodium as needed Referred to GI - advised benefiber If persistent, will give trial of Xifaxan

## 2021-12-23 NOTE — Assessment & Plan Note (Signed)
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

## 2021-12-23 NOTE — Assessment & Plan Note (Signed)
Appears euvolemic currently Takes Demadex every other day On Farxiga Followed by Cardiology

## 2021-12-23 NOTE — Patient Instructions (Signed)
Please continue taking medications as prescribed.  Please continue to follow low carb diet and ambulate as tolerated. 

## 2021-12-23 NOTE — Progress Notes (Signed)
Established Patient Office Visit  Subjective:  Patient ID: Julie Clay, female    DOB: Jun 27, 1940  Age: 81 y.o. MRN: [id number]  CC:  Chief Complaint  Patient presents with   Follow-up    Follow up DM and HTN patient was on prednisone for back pain but back is doing better    HPI Julie Clay is a 81 y.o. female with past medical history of HTN, asthma, type II DM with neuropathy, GERD, chronic diarrhea, breast ca. and GAD who presents for f/u of her chronic medical conditions.   Type II DM: She has been taking glipizide, Farxiga and Trulicity. Her HbA1c is 6.5 now, stable. Her diarrhea has slightly improved since stopping metformin.  Denies any polyuria or polydipsia currently.  She takes Crestor for HLD.   She has history of Mobitz 2 second-degree heart block, s/p pacemaker placement and HFpEF. BP is well-controlled. Takes medications regularly. Patient denies headache, dizziness, chest pain, dyspnea or palpitations.  Chronic diarrhea: She has been having diarrhea, which is chronic and intermittent.  She has seen GI, who recommended benefiber and Imodium as needed for diarrhea, which has helped her.  She denies any melena or hematochezia currently.  Denies any fever or chills.  She reports having a mole over her left cheek area and over her left upper arm area.  Left cheek area mole has changed color recently and has been itching as well.  Past Medical History:  Diagnosis Date   2nd degree AV block 06/22/2017   Arthritis    Asthma    Bone spur    Breast cancer, left breast ([place])    2012   Breast cancer, right breast ([place])    2021   Chronic bronchitis (HCC)    COPD (chronic obstructive pulmonary disease) ([place])    Diabetes mellitus without complication ([place])    Phreesia 31/43/8887   Diastolic heart failure ([place])    Essential hypertension    Fatty liver 2013   GERD (gastroesophageal reflux disease)    HSV (herpes simplex virus) infection    Hyperlipidemia    Lung nodule  seen on imaging study 2013   Lymphedema    Left arm   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    Osteoarthritis    both knees, lower back, both shoulders; bone spurs to feet   Rectocele 06/25/2013   Posterior repair 11/06/13    Type II diabetes mellitus ([place])    Ventral hernia     Past Surgical History:  Procedure Laterality Date   BREAST BIOPSY Left 04/2011   BREAST BIOPSY Left 06/22/13   BREAST BIOPSY Left 09/12/2013   Procedure: BREAST BIOPSY WITH NEEDLE LOCALIZATION;  Surgeon: Jamesetta So, MD;  Location: [facility];  Service: General;  Laterality: Left;   BREAST LUMPECTOMY Left 04/2011 X 2   BREAST SURGERY N/A    Phreesia 01/17/2020   CATARACT EXTRACTION W/ INTRAOCULAR LENS  IMPLANT, BILATERAL Bilateral    CATARACT EXTRACTION, BILATERAL     on different occassions    [place]   COLON SURGERY N/A    2014   DILATION AND CURETTAGE OF UTERUS  11/06/2013   HAND LIGAMENT RECONSTRUCTION Right ~ 2010   HYSTEROSCOPY WITH D & C N/A 11/06/2013   Procedure: DILATATION AND CURETTAGE /HYSTEROSCOPY;  Surgeon: Jonnie Kind, MD;  Location: [facility];  Service: Gynecology;  Laterality: N/A;   INSERT / REPLACE / REMOVE PACEMAKER  06/22/2017  LUNG LOBECTOMY Right 1988   Fungal Infection   PACEMAKER IMPLANT N/A 06/22/2017   Procedure: PACEMAKER IMPLANT;  Surgeon: Deboraha Sprang, MD;  Location: [facility];  Service: Cardiovascular;  Laterality: N/A;   PARTIAL MASTECTOMY WITH NEEDLE LOCALIZATION Right 01/02/2020   Procedure: RIGHT PARTIAL MASTECTOMY AFTER NEEDLE LOCALIZATION;  Surgeon: Aviva Signs, MD;  Location: [facility];  Service: General;  Laterality: Right;   POLYPECTOMY N/A 11/06/2013   Procedure: POLYPECTOMY (REMOVAL ENDOMETRIAL POLYP);  Surgeon: Jonnie Kind, MD;  Location: [facility];  Service: Gynecology;  Laterality: N/A;   RECTOCELE REPAIR N/A 11/06/2013   Procedure: POSTERIOR REPAIR (RECTOCELE);  Surgeon: Jonnie Kind, MD;  Location: [facility];  Service: Gynecology;   Laterality: N/A;   SHOULDER ARTHROSCOPY WITH OPEN ROTATOR CUFF REPAIR Left 07/26/2012   TONSILLECTOMY  1949   TUBAL LIGATION      Family History  Problem Relation Age of Onset   Hypertension Mother    Heart disease Mother    Diabetes Mother    Kidney disease Mother        ESRD   Cancer Mother        Bladder   Hypertension Father    Hyperlipidemia Father    Heart disease Father    Diabetes Father    Cancer Father        Stomach Cancer   Diabetes Sister    Heart disease Sister    Alzheimer's disease Paternal Aunt    Stroke Maternal Grandfather    Cancer Paternal Grandfather        stomach   Cancer Maternal Uncle        prostate   Cancer Paternal Uncle        stomach    Social History   Socioeconomic History   Marital status: Married    Spouse name: Tommi Rumps   Number of children: 2   Years of education: 12   Highest education level: Some college, no degree  Occupational History   Occupation: retired     Comment: Product manager for [employer]  Tobacco Use   Smoking status: Never   Smokeless tobacco: Never  Vaping Use   Vaping Use: Never used  Substance and Sexual Activity   Alcohol use: Not Currently    Comment: 06/22/2017 "might have a drink twice a year"   Drug use: No   Sexual activity: Not Currently    Birth control/protection: Surgical  Other Topics Concern   Not on file  Social History Narrative   Not on file   Social Determinants of Health   Financial Resource Strain: Low Risk  (04/21/2021)   Overall Financial Resource Strain (CARDIA)    Difficulty of Paying Living Expenses: Not hard at all  Food Insecurity: No Food Insecurity (04/21/2021)   Hunger Vital Sign    Worried About Running Out of Food in the Last Year: Never true    Ran Out of Food in the Last Year: Never true  Transportation Needs: No Transportation Needs (04/21/2021)   PRAPARE - Hydrologist (Medical): No    Lack of Transportation (Non-Medical): No   Physical Activity: Inactive (04/21/2021)   Exercise Vital Sign    Days of Exercise per Week: 0 days    Minutes of Exercise per Session: 0 min  Stress: No Stress Concern Present (04/21/2021)   [place]    Feeling of Stress : Not at all  Social  Connections: Socially Integrated (04/21/2021)   Social Connection and Isolation Panel [NHANES]    Frequency of Communication with Friends and Family: More than three times a week    Frequency of Social Gatherings with Friends and Family: Once a week    Attends Religious Services: More than 4 times per year    Active Member of [employer] or Organizations: Yes    Attends Music therapist: More than 4 times per year    Marital Status: Married  Human resources officer Violence: Not At Risk (04/21/2021)   Humiliation, Afraid, Rape, and Kick questionnaire    Fear of Current or Ex-Partner: No    Emotionally Abused: No    Physically Abused: No    Sexually Abused: No    Outpatient Medications Prior to Visit  Medication Sig Dispense Refill   acetaminophen (TYLENOL) 500 MG tablet Take 1,000 mg by mouth every 6 (six) hours as needed for moderate pain.     albuterol (VENTOLIN HFA) 108 (90 Base) MCG/ACT inhaler Inhale 2 puffs into the lungs every 6 (six) hours as needed for wheezing or shortness of breath. 8 g 0   Aloe Vera Leaf POWD Take 800 mg by mouth 2 (two) times daily.     Ascorbic Acid (VITAMIN C) 1000 MG tablet Take 1,000 mg by mouth 2 (two) times daily.     b complex vitamins tablet Take 1 tablet by mouth 2 (two) times daily.      Blood Glucose Monitoring Suppl (BLOOD GLUCOSE SYSTEM PAK) KIT Use as directed to monitor FSBS 1x daily. Dx: E11.9. Please dispense as Accu-Chek Aviva 1 each 1   Calcium Polycarbophil (FIBER-CAPS PO) Take by mouth daily at 6 (six) AM. Two po QHS     Cholecalciferol (VITAMIN D3) 2000 UNITS TABS Take 2,000 Units by mouth 2 (two) times daily.     dapagliflozin  propanediol (FARXIGA) 10 MG TABS tablet Take 1 tablet (10 mg total) by mouth daily before breakfast. 90 tablet 1   diclofenac Sodium (VOLTAREN) 1 % GEL Apply 2 g topically 4 (four) times daily. (Patient taking differently: Apply 2 g topically 4 (four) times daily. prn) 100 g 1   Dulaglutide (TRULICITY) 1.5 LK/4.4WN SOPN Inject 1.5 mg into the skin once a week. 6 mL 3   fluocinonide cream (LIDEX) 0.27 % Apply 1 application topically 2 (two) times daily as needed (poison ivy/bites/hives). 30 g 11   Ginkgo Biloba 120 MG CAPS Take 120 mg by mouth in the morning and at bedtime.      glipiZIDE (GLUCOTROL XL) 5 MG 24 hr tablet Take 1 tablet (5 mg total) by mouth daily with breakfast. 90 tablet 1   Glucose Blood (BLOOD GLUCOSE TEST STRIPS) STRP Use as directed to monitor FSBS 1x daily. Dx: E11.9. Please dispense as Accu-Chek Aviva 100 each 1   Krill Oil 1000 MG CAPS Take 1,000 mg by mouth 2 (two) times daily.      Lancets MISC Use as directed to monitor FSBS 1x daily. Dx: E11.9. Please dispense as Accu-Chek Aviva 100 each 1   loperamide (IMODIUM A-D) 2 MG tablet Take 1 tablet (2 mg total) by mouth 4 (four) times daily as needed for diarrhea or loose stools. 30 tablet 1   LORazepam (ATIVAN) 0.5 MG tablet Take 1 tablet (0.5 mg total) by mouth 2 (two) times daily as needed for anxiety. 15 tablet 0   metoprolol succinate (TOPROL-XL) 25 MG 24 hr tablet Take 0.5 tablets (12.5 mg total) by mouth daily.  45 tablet 3   Multiple Vitamin (MULTIVITAMIN WITH MINERALS) TABS tablet Take 1 tablet by mouth daily.     naftifine (NAFTIN) 1 % cream Apply topically 2 (two) times daily as needed. 60 g 2   rosuvastatin (CRESTOR) 5 MG tablet TAKE 1 TABLET BY MOUTH ONCE DAILY FOR CHOLESTEROL 90 tablet 0   simethicone (MYLICON) 80 MG chewable tablet Chew 80 mg by mouth as needed for flatulence.      tiZANidine (ZANAFLEX) 4 MG tablet Take 1 tablet (4 mg total) by mouth every 6 (six) hours as needed for muscle spasms. (Patient taking  differently: Take 4 mg by mouth every 6 (six) hours as needed for muscle spasms. 2 mg QHS) 180 tablet 1   torsemide (DEMADEX) 20 MG tablet Take 1 tablet (20 mg total) by mouth 3 (three) times a week. (Patient taking differently: Take 10 mg by mouth every other day.) 40 tablet 3   UNABLE TO FIND Please dispense custom molded foot orthotics and heel spurs Dx: E11.30 1 each 0   predniSONE (STERAPRED UNI-PAK 48 TAB) 10 MG (48) TBPK tablet Take by mouth 2 (two) times daily.     No facility-administered medications prior to visit.    Allergies  Allergen Reactions   Arimidex [Anastrozole] Other (See Comments)    Arthralgias and myalgias.  Improved with 3 week hiatus from drug.  Categorical side effect of drug class.   Other Hives and Itching   Adhesive [Tape] Other (See Comments)    "red and blistered"   Lisinopril     Dry hacking cough and tickle   Tetanus Toxoids    Tetracyclines & Related Other (See Comments)    unknown    ROS Review of Systems  Constitutional:  Negative for chills and fever.  HENT:  Negative for congestion, sinus pressure, sinus pain and sore throat.   Eyes:  Negative for pain and discharge.  Respiratory:  Negative for cough and shortness of breath.   Cardiovascular:  Negative for chest pain and palpitations.  Gastrointestinal:  Positive for diarrhea. Negative for abdominal pain, nausea and vomiting.  Endocrine: Negative for polydipsia and polyuria.  Genitourinary:  Negative for dysuria and hematuria.  Musculoskeletal:  Negative for neck pain and neck stiffness.  Skin:  Positive for color change (Left face area mole). Negative for rash.  Neurological:  Negative for dizziness and weakness.  Psychiatric/Behavioral:  Negative for agitation and behavioral problems. The patient is nervous/anxious.       Objective:    Physical Exam Vitals reviewed.  Constitutional:      General: She is not in acute distress.    Appearance: She is obese. She is not diaphoretic.   HENT:     Head: Normocephalic and atraumatic.     Nose: Nose normal. No congestion.     Mouth/Throat:     Mouth: Mucous membranes are moist.     Pharynx: No posterior oropharyngeal erythema.  Eyes:     General: No scleral icterus.    Extraocular Movements: Extraocular movements intact.  Cardiovascular:     Rate and Rhythm: Normal rate and regular rhythm.     Pulses: Normal pulses.     Heart sounds: Normal heart sounds. No murmur heard. Pulmonary:     Breath sounds: Normal breath sounds. No wheezing or rales.  Musculoskeletal:     Cervical back: Neck supple. No tenderness.     Right lower leg: No edema.     Left lower leg: No edema.  Skin:  General: Skin is warm.     Findings: Lesion (Brownish papule over left side of face and left upper arm - likely actinic keratoses) present. No rash.  Neurological:     General: No focal deficit present.     Mental Status: She is alert and oriented to person, place, and time.  Psychiatric:        Mood and Affect: Mood normal.        Behavior: Behavior normal.     BP 136/62 (BP Location: Right Arm, Patient Position: Sitting, Cuff Size: Normal)   Pulse 72   Resp 18   Ht 5' 5"  (1.651 m)   Wt 222 lb (100.7 kg)   SpO2 97%   BMI 36.94 kg/m  Wt Readings from Last 3 Encounters:  12/23/21 222 lb (100.7 kg)  12/11/21 226 lb (102.5 kg)  11/11/21 224 lb (101.6 kg)    Lab Results  Component Value Date   TSH 3.12 01/18/2020   Lab Results  Component Value Date   WBC 10.4 08/11/2021   HGB 15.3 08/11/2021   HCT 45.4 08/11/2021   MCV 83 08/11/2021   PLT 255 08/11/2021   Lab Results  Component Value Date   NA 145 (H) 08/11/2021   K 4.6 08/11/2021   CO2 23 08/11/2021   GLUCOSE 101 (H) 08/11/2021   BUN 16 08/11/2021   CREATININE 0.85 08/11/2021   BILITOT 0.5 08/11/2021   ALKPHOS 65 08/11/2021   AST 23 08/11/2021   ALT 19 08/11/2021   PROT 6.8 08/11/2021   ALBUMIN 4.3 08/11/2021   CALCIUM 9.8 08/11/2021   ANIONGAP 7 06/12/2021    EGFR 70 08/11/2021   Lab Results  Component Value Date   CHOL 151 08/11/2021   Lab Results  Component Value Date   HDL 57 08/11/2021   Lab Results  Component Value Date   LDLCALC 68 08/11/2021   Lab Results  Component Value Date   TRIG 156 (H) 08/11/2021   Lab Results  Component Value Date   CHOLHDL 2.7 04/08/2020   Lab Results  Component Value Date   HGBA1C 6.5 12/23/2021   HGBA1C 6.5 12/23/2021      Assessment & Plan:   Problem List Items Addressed This Visit       Cardiovascular and Mediastinum   Essential hypertension, benign - Primary    BP Readings from Last 1 Encounters:  12/23/21 136/62  Well-controlled Counseled for compliance with the medications Advised DASH diet and moderate exercise/walking as tolerated      Relevant Orders   TSH   CMP14+EGFR   CBC with Differential/Platelet   (HFpEF) heart failure with preserved ejection fraction ([place])    Appears euvolemic currently Takes Demadex every other day On Farxiga Followed by Cardiology      Relevant Orders   TSH   CMP14+EGFR     Digestive   Irritable bowel syndrome with both constipation and diarrhea    Chronic, could be related to IBS-D - somewhat improved since stopping Metformin Imodium as needed Referred to GI - advised benefiber If persistent, will give trial of Xifaxan        Endocrine   DM (diabetes mellitus) ([place])    Lab Results  Component Value Date   HGBA1C 6.4 (H) 08/11/2021  Well-controlled On glipizide 5 mg QD, Farxiga 10 mg QD and Trulicity 1.5 mg qw Advised to follow diabetic diet On statin F/u CMP and lipid panel Diabetic eye exam: Advised to follow up with Ophthalmology for diabetic eye  exam      Relevant Orders   POCT glycosylated hemoglobin (Hb A1C) (Completed)   Hemoglobin A1c   CMP14+EGFR     Musculoskeletal and Integument   Actinic keratoses    Lesions over face and left upper arm likely benign actinic keratosis Referred to Dermatology as she has  noticed recent change in color over face lesions      Relevant Orders   Ambulatory referral to Dermatology     Other   Hyperlipidemia    On Crestor      Relevant Orders   Lipid panel   GAD (generalized anxiety disorder)    Takes Ativan occasionally for severe anxiety, refilled      Other Visit Diagnoses     Vitamin D deficiency       Relevant Orders   VITAMIN D 25 Hydroxy (Vit-D Deficiency, Fractures)   Need for immunization against influenza       Relevant Orders   Flu Vaccine QUAD High Dose(Fluad) (Completed)       No orders of the defined types were placed in this encounter.   Follow-up: Return in about 6 months (around 06/23/2022) for Annual physical.    Lindell Spar, MD

## 2022-01-11 NOTE — Progress Notes (Signed)
Remote pacemaker transmission.   

## 2022-01-15 DIAGNOSIS — M545 Low back pain, unspecified: Secondary | ICD-10-CM | POA: Diagnosis not present

## 2022-01-31 ENCOUNTER — Other Ambulatory Visit: Payer: Self-pay | Admitting: Internal Medicine

## 2022-01-31 DIAGNOSIS — E1143 Type 2 diabetes mellitus with diabetic autonomic (poly)neuropathy: Secondary | ICD-10-CM

## 2022-03-15 ENCOUNTER — Telehealth: Payer: Self-pay | Admitting: Internal Medicine

## 2022-03-15 DIAGNOSIS — L820 Inflamed seborrheic keratosis: Secondary | ICD-10-CM | POA: Diagnosis not present

## 2022-03-15 DIAGNOSIS — D2239 Melanocytic nevi of other parts of face: Secondary | ICD-10-CM | POA: Diagnosis not present

## 2022-03-15 DIAGNOSIS — L821 Other seborrheic keratosis: Secondary | ICD-10-CM | POA: Diagnosis not present

## 2022-03-15 NOTE — Telephone Encounter (Signed)
Patient stopped by office in regard to  [place]   Starting in January 2024 med will require pre auth

## 2022-03-15 NOTE — Telephone Encounter (Signed)
Will wait for PA auth

## 2022-03-24 ENCOUNTER — Ambulatory Visit (INDEPENDENT_AMBULATORY_CARE_PROVIDER_SITE_OTHER): Payer: Medicare PPO

## 2022-03-24 DIAGNOSIS — I441 Atrioventricular block, second degree: Secondary | ICD-10-CM

## 2022-03-24 LAB — CUP PACEART REMOTE DEVICE CHECK
Battery Remaining Longevity: 52 mo
Battery Remaining Percentage: 50 %
Battery Voltage: 2.98 V
Brady Statistic AP VP Percent: 6.3 %
Brady Statistic AP VS Percent: 1 %
Brady Statistic AS VP Percent: 94 %
Brady Statistic AS VS Percent: 1 %
Brady Statistic RA Percent Paced: 6.1 %
Brady Statistic RV Percent Paced: 99 %
Date Time Interrogation Session: 20231206033616
Implantable Lead Connection Status: 753985
Implantable Lead Connection Status: 753985
Implantable Lead Implant Date: 20190306
Implantable Lead Implant Date: 20190306
Implantable Lead Location: 753859
Implantable Lead Location: 753860
Implantable Lead Model: 5076
Implantable Lead Model: 5076
Implantable Pulse Generator Implant Date: 20190306
Lead Channel Impedance Value: 360 Ohm
Lead Channel Impedance Value: 530 Ohm
Lead Channel Pacing Threshold Amplitude: 0.5 V
Lead Channel Pacing Threshold Amplitude: 0.75 V
Lead Channel Pacing Threshold Pulse Width: 0.5 ms
Lead Channel Pacing Threshold Pulse Width: 0.5 ms
Lead Channel Sensing Intrinsic Amplitude: 4.6 mV
Lead Channel Sensing Intrinsic Amplitude: 5.6 mV
Lead Channel Setting Pacing Amplitude: 2 V
Lead Channel Setting Pacing Amplitude: 2.5 V
Lead Channel Setting Pacing Pulse Width: 0.5 ms
Lead Channel Setting Sensing Sensitivity: 4 mV
Pulse Gen Model: 2272
Pulse Gen Serial Number: 9001027

## 2022-03-29 ENCOUNTER — Encounter: Payer: Self-pay | Admitting: Family Medicine

## 2022-03-29 ENCOUNTER — Ambulatory Visit (INDEPENDENT_AMBULATORY_CARE_PROVIDER_SITE_OTHER): Payer: Medicare PPO | Admitting: Family Medicine

## 2022-03-29 VITALS — BP 136/82 | HR 87 | Ht 65.0 in | Wt 222.0 lb

## 2022-03-29 DIAGNOSIS — T148XXA Other injury of unspecified body region, initial encounter: Secondary | ICD-10-CM

## 2022-03-29 NOTE — Progress Notes (Signed)
Acute Office Visit  Subjective:    Patient ID: Julie Clay, female    DOB: [birth year], 81 y.o.   MRN: [id number]  Chief Complaint  Patient presents with   Shoulder Pain    Pt reports shoulder pain since 03/19/2022, pain has improved some, but still present. Pt reports bending and lifting/moving something heavy   Flank Pain    Pt reports side pain started on 03/19/2022.    HPI Patient is in today with complaints of left shoulder and left side pain that has subsided since its onset on 03/19/2022.  She reports pulling a muscle while overextending for an object.  She reports taking extra strength Tylenol and half a tablet of Zanaflex for relief of her symptoms.  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    Osteoarthritis    both knees, lower back, both shoulders; bone spurs to feet   Rectocele 06/25/2013   Posterior repair 11/06/13    Type II diabetes mellitus ([place])    Ventral hernia     Past Surgical History:  Procedure Laterality Date   BREAST BIOPSY Left 04/2011   BREAST BIOPSY Left 06/22/13   BREAST BIOPSY Left 09/12/2013   Procedure: BREAST BIOPSY WITH NEEDLE LOCALIZATION;  Surgeon: Jamesetta So, MD;  Location: [facility];  Service: General;  Laterality: Left;   BREAST LUMPECTOMY Left 04/2011 X 2   BREAST SURGERY N/A    Phreesia 01/17/2020   CATARACT EXTRACTION W/ INTRAOCULAR LENS   IMPLANT, BILATERAL Bilateral    CATARACT EXTRACTION, BILATERAL     on different occassions    [place]   COLON SURGERY N/A    2014   DILATION AND CURETTAGE OF UTERUS  11/06/2013   HAND LIGAMENT RECONSTRUCTION Right ~ 2010   HYSTEROSCOPY WITH D & C N/A 11/06/2013   Procedure: DILATATION AND CURETTAGE /HYSTEROSCOPY;  Surgeon: Jonnie Kind, MD;  Location: [facility];  Service: Gynecology;  Laterality: N/A;   INSERT / REPLACE / REMOVE PACEMAKER  06/22/2017   LUNG LOBECTOMY Right 1988   Fungal Infection   PACEMAKER IMPLANT N/A 06/22/2017   Procedure: PACEMAKER IMPLANT;  Surgeon: Deboraha Sprang, MD;  Location: [facility];  Service: Cardiovascular;  Laterality: N/A;   PARTIAL MASTECTOMY WITH NEEDLE LOCALIZATION Right 01/02/2020   Procedure: RIGHT PARTIAL MASTECTOMY AFTER NEEDLE LOCALIZATION;  Surgeon: Aviva Signs, MD;  Location: [facility];  Service: General;  Laterality: Right;   POLYPECTOMY N/A 11/06/2013   Procedure: POLYPECTOMY (REMOVAL ENDOMETRIAL POLYP);  Surgeon: Jonnie Kind, MD;  Location: [facility];  Service: Gynecology;  Laterality: N/A;   RECTOCELE REPAIR N/A 11/06/2013   Procedure: POSTERIOR REPAIR (RECTOCELE);  Surgeon: Jonnie Kind, MD;  Location:  [facility];  Service: Gynecology;  Laterality: N/A;   SHOULDER ARTHROSCOPY WITH OPEN ROTATOR CUFF REPAIR Left 07/26/2012   TONSILLECTOMY  1949   TUBAL LIGATION      Family History  Problem Relation Age of Onset   Hypertension Mother    Heart disease Mother    Diabetes Mother    Kidney disease Mother        ESRD   Cancer Mother        Bladder   Hypertension Father    Hyperlipidemia Father    Heart disease Father    Diabetes Father    Cancer Father        Stomach Cancer   Diabetes Sister    Heart disease Sister    Alzheimer's disease Paternal Aunt    Stroke Maternal Grandfather    Cancer Paternal Grandfather        stomach   Cancer Maternal Uncle        prostate   Cancer Paternal Uncle        stomach     Social History   Socioeconomic History   Marital status: Married    Spouse name: Tommi Rumps   Number of children: 2   Years of education: 12   Highest education level: Some college, no degree  Occupational History   Occupation: retired     Comment: Product manager for [employer]  Tobacco Use   Smoking status: Never   Smokeless tobacco: Never  Vaping Use   Vaping Use: Never used  Substance and Sexual Activity   Alcohol use: Not Currently    Comment: 06/22/2017 "might have a drink twice a year"   Drug use: No   Sexual activity: Not Currently    Birth control/protection: Surgical  Other Topics Concern   Not on file  Social History Narrative   Not on file   Social Determinants of Health   Financial Resource Strain: Low Risk  (04/21/2021)   Overall Financial Resource Strain (CARDIA)    Difficulty of Paying Living Expenses: Not hard at all  Food Insecurity: No Food Insecurity (04/21/2021)   Hunger Vital Sign    Worried About Running Out of Food in the Last Year: Never true    Ran Out of Food in the Last Year: Never true  Transportation Needs: No Transportation Needs (04/21/2021)   PRAPARE - Hydrologist (Medical): No    Lack of Transportation (Non-Medical): No  Physical Activity: Inactive (04/21/2021)   Exercise Vital Sign    Days of Exercise per Week: 0 days    Minutes of Exercise per Session: 0 min  Stress: No Stress Concern Present (04/21/2021)   [place]    Feeling of Stress : Not at all  Social Connections: [place] (04/21/2021)   Social Connection and Isolation Panel [NHANES]    Frequency of Communication with Friends and Family: More than three times a week    Frequency of Social Gatherings with Friends and Family: Once a week    Attends Religious Services: More than 4 times per year    Active Member of [employer] or Organizations: Yes    Attends Arts administrator: More than 4 times per year    Marital Status: Married  Human resources officer Violence: Not At Risk (04/21/2021)   Humiliation, Afraid, Rape, and Kick questionnaire    Fear of Current or Ex-Partner: No    Emotionally Abused: No  Physically Abused: No    Sexually Abused: No    Outpatient Medications Prior to Visit  Medication Sig Dispense Refill   acetaminophen (TYLENOL) 500 MG tablet Take 1,000 mg by mouth every 6 (six) hours as needed for moderate pain.     albuterol (VENTOLIN HFA) 108 (90 Base) MCG/ACT inhaler Inhale 2 puffs into the lungs every 6 (six) hours as needed for wheezing or shortness of breath. 8 g 0   Aloe Vera Leaf POWD Take 800 mg by mouth 2 (two) times daily.     Ascorbic Acid (VITAMIN C) 1000 MG tablet Take 1,000 mg by mouth 2 (two) times daily.     b complex vitamins tablet Take 1 tablet by mouth 2 (two) times daily.      Blood Glucose Monitoring Suppl (BLOOD GLUCOSE SYSTEM PAK) KIT Use as directed to monitor FSBS 1x daily. Dx: E11.9. Please dispense as Accu-Chek Aviva 1 each 1   Calcium Polycarbophil (FIBER-CAPS PO) Take by mouth daily at 6 (six) AM. Two po QHS     Cholecalciferol (VITAMIN D3) 2000 UNITS TABS Take 2,000 Units by mouth 2 (two) times daily.     dapagliflozin propanediol (FARXIGA) 10 MG TABS tablet Take 1 tablet (10 mg total) by mouth daily before breakfast. 90 tablet 1   diclofenac Sodium (VOLTAREN) 1 % GEL Apply 2 g topically 4 (four) times daily. (Patient taking differently: Apply 2 g topically 4 (four) times daily. prn) 100 g 1   Dulaglutide (TRULICITY) 1.5 FK/8.1EX SOPN Inject 1.5 mg into the skin once a week. 6 mL 3   fluocinonide cream (LIDEX) 5.17 % Apply 1 application topically 2 (two) times daily as needed (poison ivy/bites/hives). 30 g 11   Ginkgo Biloba 120 MG CAPS Take 120 mg by mouth in the morning and at bedtime.      glipiZIDE (GLUCOTROL XL) 5 MG 24 hr tablet Take 1 tablet by mouth once daily with breakfast 90 tablet 0   Glucose Blood  (BLOOD GLUCOSE TEST STRIPS) STRP Use as directed to monitor FSBS 1x daily. Dx: E11.9. Please dispense as Accu-Chek Aviva 100 each 1   Krill Oil 1000 MG CAPS Take 1,000 mg by mouth 2 (two) times daily.      Lancets MISC Use as directed to monitor FSBS 1x daily. Dx: E11.9. Please dispense as Accu-Chek Aviva 100 each 1   loperamide (IMODIUM A-D) 2 MG tablet Take 1 tablet (2 mg total) by mouth 4 (four) times daily as needed for diarrhea or loose stools. 30 tablet 1   LORazepam (ATIVAN) 0.5 MG tablet Take 1 tablet (0.5 mg total) by mouth 2 (two) times daily as needed for anxiety. 15 tablet 0   metoprolol succinate (TOPROL-XL) 25 MG 24 hr tablet Take 0.5 tablets (12.5 mg total) by mouth daily. 45 tablet 3   Multiple Vitamin (MULTIVITAMIN WITH MINERALS) TABS tablet Take 1 tablet by mouth daily.     naftifine (NAFTIN) 1 % cream Apply topically 2 (two) times daily as needed. 60 g 2   predniSONE (STERAPRED UNI-PAK 48 TAB) 10 MG (48) TBPK tablet Take by mouth 2 (two) times daily.     rosuvastatin (CRESTOR) 5 MG tablet TAKE 1 TABLET BY MOUTH ONCE DAILY FOR CHOLESTEROL 90 tablet 0   simethicone (MYLICON) 80 MG chewable tablet Chew 80 mg by mouth as needed for flatulence.      tiZANidine (ZANAFLEX) 4 MG tablet Take 1 tablet (4 mg total) by mouth every 6 (six) hours as needed for  muscle spasms. (Patient taking differently: Take 4 mg by mouth every 6 (six) hours as needed for muscle spasms. 2 mg QHS) 180 tablet 1   torsemide (DEMADEX) 20 MG tablet Take 1 tablet (20 mg total) by mouth 3 (three) times a week. (Patient taking differently: Take 10 mg by mouth every other day.) 40 tablet 3   UNABLE TO FIND Please dispense custom molded foot orthotics and heel spurs Dx: E11.30 1 each 0   No facility-administered medications prior to visit.    Allergies  Allergen Reactions   Arimidex [Anastrozole] Other (See Comments)    Arthralgias and myalgias.  Improved with 3 week hiatus from drug.  Categorical side effect of  drug class.   Other Hives and Itching   Adhesive [Tape] Other (See Comments)    "red and blistered"   Lisinopril     Dry hacking cough and tickle   Tetanus Toxoids    Tetracyclines & Related Other (See Comments)    unknown    Review of Systems  Constitutional:  Negative for chills and fever.  Eyes:  Negative for visual disturbance.  Respiratory:  Negative for chest tightness and shortness of breath.   Neurological:  Negative for dizziness and headaches.       Objective:    Physical Exam HENT:     Head: Normocephalic.     Mouth/Throat:     Mouth: Mucous membranes are moist.  Cardiovascular:     Rate and Rhythm: Normal rate.     Heart sounds: Normal heart sounds.  Pulmonary:     Effort: Pulmonary effort is normal.     Breath sounds: Normal breath sounds.  Musculoskeletal:        General: No tenderness.     Comments: No pain with palpation of the left shoulder and left flank Range of motion is intact   Neurological:     Mental Status: She is alert.     BP 136/82 (BP Location: Right Arm)   Pulse 87   Ht _0  (1.651 m)   Wt 222 lb (100.7 kg)   SpO2 93%   BMI 36.94 kg/m  Wt Readings from Last 3 Encounters:  03/29/22 222 lb (100.7 kg)  12/23/21 222 lb (100.7 kg)  12/11/21 226 lb (102.5 kg)       Assessment & Plan:  Muscle strain Assessment & Plan: Encouraged to continue conservative  management with ice and heat application, Tylenol, and muscle relaxant as needed Encourage patient to follow up for worsening of her symptoms     Alvira Monday, FNP

## 2022-03-29 NOTE — Patient Instructions (Signed)
I appreciate the opportunity to provide care to you today!    Follow up: Dr. Posey Pronto  HAPPY HOLIDAYS  Usually, recovery from muscle strain takes 1-2 weeks. Complete healing normally takes 5-6 weeks.  Continue taking Tylenol as needed for pain management Continue heat application 20 minutes 2-3 times a day as needed for pain management and alternate with ice therapy Avoid activities that aggravates your symptoms    Please continue to a heart-healthy diet and increase your physical activities. Try to exercise for 81mins at least three times a week.      It was a pleasure to see you and I look forward to continuing to work together on your health and well-being. Please do not hesitate to call the office if you need care or have questions about your care.   Have a wonderful day and week. With Gratitude, Alvira Monday MSN, FNP-BC

## 2022-03-29 NOTE — Assessment & Plan Note (Signed)
Encouraged to continue conservative  management with ice and heat application, Tylenol, and muscle relaxant as needed Encourage patient to follow up for worsening of her symptoms

## 2022-03-31 ENCOUNTER — Ambulatory Visit: Payer: Federal, State, Local not specified - PPO | Attending: Internal Medicine | Admitting: Internal Medicine

## 2022-03-31 ENCOUNTER — Encounter: Payer: Self-pay | Admitting: Internal Medicine

## 2022-03-31 VITALS — BP 132/72 | HR 97 | Ht 65.0 in | Wt 218.0 lb

## 2022-03-31 DIAGNOSIS — R072 Precordial pain: Secondary | ICD-10-CM | POA: Diagnosis not present

## 2022-03-31 DIAGNOSIS — I5032 Chronic diastolic (congestive) heart failure: Secondary | ICD-10-CM | POA: Diagnosis not present

## 2022-03-31 MED ORDER — METOPROLOL TARTRATE 100 MG PO TABS: ORAL_TABLET | ORAL | 0 refills | Status: DC

## 2022-03-31 NOTE — Progress Notes (Signed)
Patient Care Team: Lindell Spar, MD as PCP - General (Internal Medicine) Deboraha Sprang, MD as PCP - Electrophysiology (Cardiology) Satira Sark, MD as PCP - Cardiology (Cardiology) Edythe Clarity, [facility] as Pharmacist (Pharmacist)   HPI  Julie Clay is a 81 y.o. female Seen in follow-up for a Abbott  pacemaker implanted 3/19 for second-degree heart block and symptomatic bradycardia.  Major complaint is dyspnea on exertion less than about 30 feet.  3 pillow orthopnea.  Remote history of asthma.  Occasional peripheral edema.  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    Osteoarthritis    both knees, lower back, both shoulders; bone spurs to feet   Rectocele 06/25/2013   Posterior repair 11/06/13    Type II diabetes mellitus ([place])    Ventral hernia     Past Surgical History:  Procedure Laterality Date   BREAST BIOPSY Left 04/2011   BREAST BIOPSY Left 06/22/13   BREAST BIOPSY Left 09/12/2013   Procedure: BREAST BIOPSY WITH NEEDLE LOCALIZATION;  Surgeon: Jamesetta So, MD;  Location: [facility];  Service: General;   Laterality: Left;   BREAST LUMPECTOMY Left 04/2011 X 2   BREAST SURGERY N/A    Phreesia 01/17/2020   CATARACT EXTRACTION W/ INTRAOCULAR LENS  IMPLANT, BILATERAL Bilateral    CATARACT EXTRACTION, BILATERAL     on different occassions    CESAREAN [place]   COLON SURGERY N/A    2014   DILATION AND CURETTAGE OF UTERUS  11/06/2013   HAND LIGAMENT RECONSTRUCTION Right ~ 2010   HYSTEROSCOPY WITH D & C N/A 11/06/2013   Procedure: DILATATION AND CURETTAGE /HYSTEROSCOPY;  Surgeon: Jonnie Kind, MD;  Location: [facility];  Service: Gynecology;  Laterality: N/A;   INSERT / REPLACE / REMOVE PACEMAKER  06/22/2017   LUNG LOBECTOMY Right 1988   Fungal Infection   PACEMAKER IMPLANT N/A 06/22/2017   Procedure: PACEMAKER IMPLANT;  Surgeon: Deboraha Sprang, MD;  Location: [facility];  Service: Cardiovascular;  Laterality: N/A;   PARTIAL MASTECTOMY WITH NEEDLE LOCALIZATION Right 01/02/2020   Procedure: RIGHT PARTIAL MASTECTOMY AFTER NEEDLE LOCALIZATION;  Surgeon: Aviva Signs, MD;  Location: [facility];  Service: General;  Laterality: Right;  POLYPECTOMY N/A 11/06/2013   Procedure: POLYPECTOMY (REMOVAL ENDOMETRIAL POLYP);  Surgeon: Jonnie Kind, MD;  Location: [facility];  Service: Gynecology;  Laterality: N/A;   RECTOCELE REPAIR N/A 11/06/2013   Procedure: POSTERIOR REPAIR (RECTOCELE);  Surgeon: Jonnie Kind, MD;  Location: [facility];  Service: Gynecology;  Laterality: N/A;   SHOULDER ARTHROSCOPY WITH OPEN ROTATOR CUFF REPAIR Left 07/26/2012   TONSILLECTOMY  1949   TUBAL LIGATION      Current Meds  Medication Sig   acetaminophen (TYLENOL) 500 MG tablet Take 1,000 mg by mouth every 6 (six) hours as needed for moderate pain.   albuterol (VENTOLIN HFA) 108 (90 Base) MCG/ACT inhaler Inhale 2 puffs into the lungs every 6 (six) hours as needed for wheezing or shortness of breath.   Aloe Vera Leaf POWD Take 800 mg by mouth 2 (two) times daily.   Ascorbic Acid (VITAMIN C) 1000 MG tablet Take 1,000 mg by  mouth 2 (two) times daily.   b complex vitamins tablet Take 1 tablet by mouth 2 (two) times daily.    Blood Glucose Monitoring Suppl (BLOOD GLUCOSE SYSTEM PAK) KIT Use as directed to monitor FSBS 1x daily. Dx: E11.9. Please dispense as Accu-Chek Aviva   Calcium Polycarbophil (FIBER-CAPS PO) Take by mouth daily at 6 (six) AM. Two po QHS   Cholecalciferol (VITAMIN D3) 2000 UNITS TABS Take 2,000 Units by mouth 2 (two) times daily.   dapagliflozin propanediol (FARXIGA) 10 MG TABS tablet Take 1 tablet (10 mg total) by mouth daily before breakfast.   diclofenac Sodium (VOLTAREN) 1 % GEL Apply 2 g topically 4 (four) times daily. (Patient taking differently: Apply 2 g topically 4 (four) times daily. prn)   Dulaglutide (TRULICITY) 1.5 BM/8.4XL SOPN Inject 1.5 mg into the skin once a week.   fluocinonide cream (LIDEX) 2.44 % Apply 1 application topically 2 (two) times daily as needed (poison ivy/bites/hives).   Ginkgo Biloba 120 MG CAPS Take 120 mg by mouth in the morning and at bedtime.    glipiZIDE (GLUCOTROL XL) 5 MG 24 hr tablet Take 1 tablet by mouth once daily with breakfast   Glucose Blood (BLOOD GLUCOSE TEST STRIPS) STRP Use as directed to monitor FSBS 1x daily. Dx: E11.9. Please dispense as Accu-Chek Aviva   Krill Oil 1000 MG CAPS Take 1,000 mg by mouth 2 (two) times daily.    Lancets MISC Use as directed to monitor FSBS 1x daily. Dx: E11.9. Please dispense as Accu-Chek Aviva   loperamide (IMODIUM A-D) 2 MG tablet Take 1 tablet (2 mg total) by mouth 4 (four) times daily as needed for diarrhea or loose stools.   LORazepam (ATIVAN) 0.5 MG tablet Take 1 tablet (0.5 mg total) by mouth 2 (two) times daily as needed for anxiety.   metoprolol succinate (TOPROL-XL) 25 MG 24 hr tablet Take 0.5 tablets (12.5 mg total) by mouth daily.   Multiple Vitamin (MULTIVITAMIN WITH MINERALS) TABS tablet Take 1 tablet by mouth daily.   naftifine (NAFTIN) 1 % cream Apply topically 2 (two) times daily as needed.    rosuvastatin (CRESTOR) 5 MG tablet TAKE 1 TABLET BY MOUTH ONCE DAILY FOR CHOLESTEROL   simethicone (MYLICON) 80 MG chewable tablet Chew 80 mg by mouth as needed for flatulence.    tiZANidine (ZANAFLEX) 4 MG tablet Take 1 tablet (4 mg total) by mouth every 6 (six) hours as needed for muscle spasms. (Patient taking differently: Take 4 mg by mouth every 6 (six) hours as needed for muscle spasms. 2  mg QHS)   torsemide (DEMADEX) 20 MG tablet Take 1 tablet (20 mg total) by mouth 3 (three) times a week. (Patient taking differently: Take 10 mg by mouth every other day.)   UNABLE TO FIND Please dispense custom molded foot orthotics and heel spurs Dx: E11.30    Allergies  Allergen Reactions   Arimidex [Anastrozole] Other (See Comments)    Arthralgias and myalgias.  Improved with 3 week hiatus from drug.  Categorical side effect of drug class.   Other Hives and Itching   Adhesive [Tape] Other (See Comments)    "red and blistered"   Lisinopril     Dry hacking cough and tickle   Tetanus Toxoids    Tetracyclines & Related Other (See Comments)    unknown      Review of Systems negative except from HPI and PMH BP 132/72   Pulse 97   Ht _0  (1.651 m)   Wt 218 lb (98.9 kg)   SpO2 99%   BMI 36.28 kg/m  Well developed and well nourished in no acute distress HENT normal Neck supple with JVP-8 Clear Device pocket well healed; without hematoma or erythema.  There is no tethering  Regular rate and rhythm, no  gallop No / murmur Abd-soft with active BS No Clubbing cyanosis  edema Skin-warm and dry A & Oriented  Grossly normal sensory and motor function  ECG    Device function is normal. Programming changes   See Paceart for details    ECG sinus with P synchronous pacing at 84 Intervals 18/17/43     Assessment and  Plan  Mobitz 2 second-degree heart block Intermittent  Dyspnea on exertion class 3  HFpEF chronic-   Pacemaker St Jude   Irregularity and intermittent ventricular  pacing  Depression Continues with shortness of breath.  Per husband it is worse per her not so sure.  Euvolemic.  She is tolerating the Demadex, also now SGLT2.  Will look at LV function now that she is ventricularly paced to see if there is a pacemaker cardiomyopathy with some atherosclerosis in the aorta and some vague chest discomfort, we will undertake CTA  Given the vigorous response to her torsemide and the associated weakness at the end of the day we will not uptitrate.  Encourage fluid restriction.    Current medicines are reviewed at length with the patient today .  The patient does not  have concerns regarding medicines.

## 2022-03-31 NOTE — Patient Instructions (Addendum)
Medication Instructions:  Your physician recommends that you continue on your current medications as directed. Please refer to the Current Medication list given to you today.  *If you need a refill on your cardiac medications before your next appointment, please call your pharmacy*   Lab Work: None ordered.  If you have labs (blood work) drawn today and your tests are completely normal, you will receive your results only by: [place] (if you have MyChart) OR A paper copy in the mail If you have any lab test that is abnormal or we need to change your treatment, we will call you to review the results.   Testing/Procedures: Your physician has requested that you have an echocardiogram. Echocardiography is a painless test that uses sound waves to create images of your heart. It provides your doctor with information about the size and shape of your heart and how well your heart's chambers and valves are working. This procedure takes approximately one hour. There are no restrictions for this procedure. Please do NOT wear cologne, perfume, aftershave, or lotions (deodorant is allowed). Please arrive 15 minutes prior to your appointment time.  Your physician has requested that you have cardiac CT. Cardiac computed tomography (CT) is a painless test that uses an x-ray machine to take clear, detailed pictures of your heart. For further information please visit [email or web address]. Please follow instruction sheet as given.     Follow-Up: At [facility], you and your health needs are our priority.  As part of our continuing mission to provide you with exceptional heart care, we have created designated Provider Care Teams.  These Care Teams include your primary Cardiologist (physician) and Advanced Practice Providers (APPs -  Physician Assistants and Nurse Practitioners) who all work together to provide you with the care you need, when you need it.  We recommend signing up for the  patient portal called "MyChart".  Sign up information is provided on this After Visit Summary.  MyChart is used to connect with patients for Virtual Visits (Telemedicine).  Patients are able to view lab/test results, encounter notes, upcoming appointments, etc.  Non-urgent messages can be sent to your provider as well.   To learn more about what you can do with MyChart, go to [email or web address].    Your next appointment:   12 months with Dr Caryl Comes  Other Instructions   Your cardiac CT will be scheduled at one of the below locations:   [facility] [address] [place], [place] [address] [phone]  If scheduled at [facility], please arrive at the [facility] and Children's Entrance (Entrance C2) of [facility] 30 minutes prior to test start time. You can use the FREE valet parking offered at entrance C (encouraged to control the heart rate for the test)  Proceed to the [facility] Radiology Department (first floor) to check-in and test prep.  All radiology patients and guests should use entrance C2 at [facility], accessed from [facility], even though the hospital's physical address listed is [address].      Please follow these instructions carefully (unless otherwise directed):  Hold all erectile dysfunction medications at least 3 days (72 hrs) prior to test. (Ie viagra, cialis, sildenafil, tadalafil, etc) We will administer nitroglycerin during this exam.   On the Night Before the Test: Be sure to Drink plenty of water. Do not consume any caffeinated/decaffeinated beverages or chocolate 12 hours prior to your test. Do not take any antihistamines 12  hours prior to your test.  On the Day of the Test: Drink plenty of water until 1 hour prior to the test. Do not eat any food 1 hour prior to test. You may take your regular medications prior to the test.  Take metoprolol (Lopressor) two hours prior to test. HOLD  Furosemide/Hydrochlorothiazide morning of the test. FEMALES- please wear underwire-free bra if available, avoid dresses & tight clothing       After the Test: Drink plenty of water. After receiving IV contrast, you may experience a mild flushed feeling. This is normal. On occasion, you may experience a mild rash up to 24 hours after the test. This is not dangerous. If this occurs, you can take Benadryl 25 mg and increase your fluid intake. If you experience trouble breathing, this can be serious. If it is severe call 911 IMMEDIATELY. If it is mild, please call our office. If you take any of these medications: Glipizide/Metformin, Avandament, Glucavance, please do not take 48 hours after completing test unless otherwise instructed.  We will call to schedule your test 2-4 weeks out understanding that some insurance companies will need an authorization prior to the service being performed.   For non-scheduling related questions, please contact the cardiac imaging nurse navigator should you have any questions/concerns: Marchia Bond, Cardiac Imaging Nurse Navigator Gordy Clement, Cardiac Imaging Nurse Navigator [facility] Direct Office Dial: [phone]   For scheduling needs, including cancellations and rescheduling, please call [place], [phone].   Important Information About Sugar

## 2022-04-01 LAB — CUP PACEART INCLINIC DEVICE CHECK
Battery Remaining Longevity: 52 mo
Battery Voltage: 2.98 V
Brady Statistic RA Percent Paced: 6.1 %
Brady Statistic RV Percent Paced: 99.98 %
Date Time Interrogation Session: 20231213161000
Implantable Lead Connection Status: 753985
Implantable Lead Connection Status: 753985
Implantable Lead Implant Date: 20190306
Implantable Lead Implant Date: 20190306
Implantable Lead Location: 753859
Implantable Lead Location: 753860
Implantable Lead Model: 5076
Implantable Lead Model: 5076
Implantable Pulse Generator Implant Date: 20190306
Lead Channel Impedance Value: 400 Ohm
Lead Channel Impedance Value: 487.5 Ohm
Lead Channel Pacing Threshold Amplitude: 0.5 V
Lead Channel Pacing Threshold Amplitude: 0.5 V
Lead Channel Pacing Threshold Amplitude: 0.75 V
Lead Channel Pacing Threshold Amplitude: 0.75 V
Lead Channel Pacing Threshold Pulse Width: 0.5 ms
Lead Channel Pacing Threshold Pulse Width: 0.5 ms
Lead Channel Pacing Threshold Pulse Width: 0.5 ms
Lead Channel Pacing Threshold Pulse Width: 0.5 ms
Lead Channel Sensing Intrinsic Amplitude: 4.8 mV
Lead Channel Setting Pacing Amplitude: 2 V
Lead Channel Setting Pacing Amplitude: 2.5 V
Lead Channel Setting Pacing Pulse Width: 0.5 ms
Lead Channel Setting Sensing Sensitivity: 4 mV
Pulse Gen Model: 2272
Pulse Gen Serial Number: 9001027

## 2022-04-07 ENCOUNTER — Ambulatory Visit (HOSPITAL_COMMUNITY)
Admission: RE | Admit: 2022-04-07 | Discharge: 2022-04-07 | Disposition: A | Discharge status: Home / Self Care | Payer: Medicare PPO | Source: Ambulatory Visit | Attending: Internal Medicine | Admitting: Internal Medicine

## 2022-04-07 DIAGNOSIS — I5032 Chronic diastolic (congestive) heart failure: Secondary | ICD-10-CM | POA: Insufficient documentation

## 2022-04-07 DIAGNOSIS — R072 Precordial pain: Secondary | ICD-10-CM | POA: Insufficient documentation

## 2022-04-07 LAB — ECHOCARDIOGRAM COMPLETE
Area-P 1/2: 3.21 cm2
S' Lateral: 2.2 cm

## 2022-04-07 NOTE — Progress Notes (Signed)
*  PRELIMINARY RESULTS* Echocardiogram 2D Echocardiogram has been performed.  Julie Clay 04/07/2022, 2:40 PM

## 2022-04-08 ENCOUNTER — Telehealth (HOSPITAL_COMMUNITY): Payer: Self-pay | Admitting: *Deleted

## 2022-04-08 NOTE — Telephone Encounter (Signed)
Reaching out to patient to offer assistance regarding upcoming cardiac imaging study; pt verbalizes understanding of appt date/time, parking situation and where to check in, pre-test NPO status and medications ordered, and verified current allergies; name and call back number provided for further questions should they arise  Gordy Clement RN Navigator Cardiac Imaging [facility] [phone] office [phone] cell  Patient to take 100mg  metoprolol tartrate two hours prior to her cardiac CT scan. She is aware to arrive at 2:30pm.

## 2022-04-09 ENCOUNTER — Ambulatory Visit (HOSPITAL_COMMUNITY)
Admission: RE | Admit: 2022-04-09 | Discharge: 2022-04-09 | Disposition: A | Discharge status: Home / Self Care | Payer: Medicare PPO | Source: Ambulatory Visit | Attending: Internal Medicine | Admitting: Internal Medicine

## 2022-04-09 VITALS — BP 140/56 | HR 66

## 2022-04-09 DIAGNOSIS — R072 Precordial pain: Secondary | ICD-10-CM | POA: Diagnosis not present

## 2022-04-09 DIAGNOSIS — R931 Abnormal findings on diagnostic imaging of heart and coronary circulation: Secondary | ICD-10-CM | POA: Diagnosis not present

## 2022-04-09 DIAGNOSIS — Z01812 Encounter for preprocedural laboratory examination: Secondary | ICD-10-CM | POA: Insufficient documentation

## 2022-04-09 DIAGNOSIS — I2510 Atherosclerotic heart disease of native coronary artery without angina pectoris: Secondary | ICD-10-CM

## 2022-04-09 LAB — POCT I-STAT CREATININE: Creatinine, Ser: 0.9 mg/dL (ref 0.44–1.00)

## 2022-04-09 MED ORDER — NITROGLYCERIN 0.4 MG SL SUBL
0.8000 mg | SUBLINGUAL_TABLET | Freq: Once | SUBLINGUAL | Status: AC
  Administered 2022-04-09: 0.8 mg via SUBLINGUAL

## 2022-04-09 MED ORDER — NITROGLYCERIN 0.4 MG SL SUBL
SUBLINGUAL_TABLET | SUBLINGUAL | Status: AC
  Filled 2022-04-09: qty 2

## 2022-04-09 MED ORDER — IOHEXOL 350 MG/ML SOLN
100.0000 mL | Freq: Once | INTRAVENOUS | Status: AC | PRN
  Administered 2022-04-09: 100 mL via INTRAVENOUS

## 2022-04-10 ENCOUNTER — Ambulatory Visit (HOSPITAL_BASED_OUTPATIENT_CLINIC_OR_DEPARTMENT_OTHER)
Admission: RE | Admit: 2022-04-10 | Discharge: 2022-04-10 | Disposition: A | Discharge status: Home / Self Care | Payer: Medicare PPO | Source: Ambulatory Visit | Attending: Internal Medicine | Admitting: Internal Medicine

## 2022-04-10 ENCOUNTER — Other Ambulatory Visit: Payer: Self-pay | Admitting: Internal Medicine

## 2022-04-10 DIAGNOSIS — R931 Abnormal findings on diagnostic imaging of heart and coronary circulation: Secondary | ICD-10-CM

## 2022-04-10 DIAGNOSIS — R072 Precordial pain: Secondary | ICD-10-CM | POA: Diagnosis not present

## 2022-04-10 DIAGNOSIS — Z01812 Encounter for preprocedural laboratory examination: Secondary | ICD-10-CM | POA: Diagnosis not present

## 2022-04-16 NOTE — Progress Notes (Signed)
Remote pacemaker transmission.   

## 2022-04-26 ENCOUNTER — Ambulatory Visit: Payer: Medicare PPO | Admitting: Family Medicine

## 2022-04-26 ENCOUNTER — Encounter: Payer: Self-pay | Admitting: Family Medicine

## 2022-04-26 VITALS — BP 136/74 | HR 85 | Ht 65.0 in | Wt 223.0 lb

## 2022-04-26 DIAGNOSIS — Z0000 Encounter for general adult medical examination without abnormal findings: Secondary | ICD-10-CM | POA: Diagnosis not present

## 2022-04-26 NOTE — Progress Notes (Signed)
Annual Wellness Visit     Patient: Julie Clay, Female    DOB: 1940-09-09, 82 y.o.   MRN: [id number]  Subjective  Chief Complaint  Patient presents with   Annual Exam    Julie Clay is a 82 y.o. female who presents today for her Annual Wellness Visit. She reports consuming a general diet. The patient does not participate in regular exercise at present. She generally feels well. She reports sleeping poorly. She does have additional problems to discuss today. Patient states has right ear hearing loss, ringing of the ears when waking up. Patient does not wear hearing aids. Patient would like an ENT referral.  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 ([place])    Pacemaker 07/13/2017   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/2017   Primary osteoarthritis of first carpometacarpal joint of left hand 06/12/2015   Fatty liver disease, nonalcoholic 23/53/6144   Lymphedema 02/18/2015   Ganglion cyst of flexor tendon sheath of finger of left hand 08/02/2014   Atypical ductal hyperplasia of left breast 07/12/2013   Endometrial polyp 06/25/2013   Diabetic neuropathy ([place]) 05/09/2013   Bladder prolapse, female, acquired 05/09/2013   Pain, joint, multiple sites 03/26/2013   Insomnia 11/29/2012   Rotator cuff syndrome of left shoulder 06/21/2012   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   albuterol (VENTOLIN HFA) 108 (90 Base) MCG/ACT inhaler Inhale 2 puffs into the lungs every 6 (six) hours as needed for wheezing or shortness of breath.   Aloe Vera Leaf POWD Take 800 mg by mouth 2 (two) times daily.   Ascorbic Acid (VITAMIN C) 1000 MG tablet Take 1,000 mg by mouth 2 (two) times daily.   b complex vitamins tablet Take 1 tablet by mouth 2 (two) times daily.    Blood Glucose Monitoring Suppl (BLOOD GLUCOSE SYSTEM PAK) KIT Use as directed to monitor FSBS 1x daily. Dx: E11.9. Please dispense as Accu-Chek Aviva   Calcium Polycarbophil (FIBER-CAPS PO) Take by mouth daily at 6 (six) AM. Two po QHS   Cholecalciferol (VITAMIN D3) 2000 UNITS TABS Take 2,000 Units by mouth 2 (two) times daily.   dapagliflozin propanediol (FARXIGA) 10 MG TABS tablet Take 1 tablet (10 mg total) by mouth daily before breakfast.   diclofenac Sodium (VOLTAREN) 1 % GEL Apply 2 g topically 4 (four) times daily. (Patient taking differently: Apply 2 g topically 4 (four) times daily. prn)   Dulaglutide (TRULICITY) 1.5 RX/5.4MG SOPN Inject 1.5 mg into the skin once a week.   fluocinonide cream (LIDEX) 8.67 % Apply 1 application topically 2 (two) times daily as needed (  poison ivy/bites/hives).   Ginkgo Biloba 120 MG CAPS Take 120 mg by mouth in the morning and at bedtime.    glipiZIDE (GLUCOTROL XL) 5 MG 24 hr tablet Take 1 tablet by mouth once daily with breakfast   Glucose Blood (BLOOD GLUCOSE TEST STRIPS) STRP Use as directed to monitor FSBS 1x daily. Dx: E11.9. Please dispense as Accu-Chek Aviva   Krill Oil 1000 MG CAPS Take 1,000 mg by mouth 2 (two) times daily.    Lancets MISC Use as directed to monitor FSBS  1x daily. Dx: E11.9. Please dispense as Accu-Chek Aviva   loperamide (IMODIUM A-D) 2 MG tablet Take 1 tablet (2 mg total) by mouth 4 (four) times daily as needed for diarrhea or loose stools.   LORazepam (ATIVAN) 0.5 MG tablet Take 1 tablet (0.5 mg total) by mouth 2 (two) times daily as needed for anxiety.   metoprolol succinate (TOPROL-XL) 25 MG 24 hr tablet Take 0.5 tablets (12.5 mg total) by mouth daily.   metoprolol tartrate (LOPRESSOR) 100 MG tablet Take 1 tablet 2 hours prior to CT scan   Multiple Vitamin (MULTIVITAMIN WITH MINERALS) TABS tablet Take 1 tablet by mouth daily.   naftifine (NAFTIN) 1 % cream Apply topically 2 (two) times daily as needed.   rosuvastatin (CRESTOR) 5 MG tablet TAKE 1 TABLET BY MOUTH ONCE DAILY FOR CHOLESTEROL   simethicone (MYLICON) 80 MG chewable tablet Chew 80 mg by mouth as needed for flatulence.    tiZANidine (ZANAFLEX) 4 MG tablet Take 1 tablet (4 mg total) by mouth every 6 (six) hours as needed for muscle spasms. (Patient taking differently: Take 4 mg by mouth every 6 (six) hours as needed for muscle spasms. 2 mg QHS)   torsemide (DEMADEX) 20 MG tablet Take 1 tablet (20 mg total) by mouth 3 (three) times a week. (Patient taking differently: Take 10 mg by mouth every other day.)   UNABLE TO FIND Please dispense custom molded foot orthotics and heel spurs Dx: E11.30   No facility-administered medications prior to visit.    Allergies  Allergen Reactions   Arimidex [Anastrozole] Other (See Comments)    Arthralgias and myalgias.  Improved with 3 week hiatus from drug.  Categorical side effect of drug class.   Other Hives and Itching   Adhesive [Tape] Other (See Comments)    "red and blistered"   Lisinopril     Dry hacking cough and tickle   Tetanus Toxoids    Tetracyclines & Related Other (See Comments)    unknown    Patient Care Team: Lindell Spar, MD as PCP - General (Internal Medicine) Deboraha Sprang, MD as PCP - Electrophysiology  (Cardiology) Satira Sark, MD as PCP - Cardiology (Cardiology) Edythe Clarity, [facility] as Pharmacist (Pharmacist)  ROS      Objective  BP 136/74   Pulse 85   Ht 5\' 5"  (1.651 m)   Wt 223 lb (101.2 kg)   SpO2 94%   BMI 37.11 kg/m  BP Readings from Last 3 Encounters:  04/26/22 136/74  04/09/22 (!) 140/56  03/31/22 132/72      Physical Exam    Most recent functional status assessment:     No data to display         Most recent fall risk assessment:    03/29/2022    4:25 PM  [place] in the past year? 0  Number falls in past yr: 0  Injury with Fall? 0  Risk  for fall due to : No Fall Risks  Follow up Falls evaluation completed    Most recent depression screenings:    03/29/2022    4:25 PM 12/23/2021    1:16 PM  PHQ 2/9 Scores  PHQ - 2 Score 0 0  PHQ- 9 Score 4 0   Most recent cognitive screening:    04/21/2021   10:49 AM  6CIT Screen  What Year? 0 points  What month? 0 points  What time? 0 points  Count back from 20 0 points  Months in reverse 0 points  Repeat phrase 0 points  Total Score 0 points   Most recent Audit-C alcohol use screening    04/21/2021   10:46 AM  Alcohol Use Disorder Test (AUDIT)  1. How often do you have a drink containing alcohol? 0  2. How many drinks containing alcohol do you have on a typical day when you are drinking? 0  3. How often do you have six or more drinks on one occasion? 0  AUDIT-C Score 0   A score of 3 or more in women, and 4 or more in men indicates increased risk for alcohol abuse, EXCEPT if all of the points are from question 1   Vision/Hearing Screen: No results found.  Last CBC Lab Results  Component Value Date   WBC 10.4 08/11/2021   HGB 15.3 08/11/2021   HCT 45.4 08/11/2021   MCV 83 08/11/2021   MCH 28.0 08/11/2021   RDW 13.1 08/11/2021   PLT 255 14/78/2956   Last metabolic panel Lab Results  Component Value Date   GLUCOSE 101 (H) 08/11/2021   NA 145 (H) 08/11/2021   K  4.6 08/11/2021   CL 108 (H) 08/11/2021   CO2 23 08/11/2021   BUN 16 08/11/2021   CREATININE 0.90 04/09/2022   EGFR 70 08/11/2021   CALCIUM 9.8 08/11/2021   PROT 6.8 08/11/2021   ALBUMIN 4.3 08/11/2021   LABGLOB 2.5 08/11/2021   AGRATIO 1.7 08/11/2021   BILITOT 0.5 08/11/2021   ALKPHOS 65 08/11/2021   AST 23 08/11/2021   ALT 19 08/11/2021   ANIONGAP 7 06/12/2021   Last lipids Lab Results  Component Value Date   CHOL 151 08/11/2021   HDL 57 08/11/2021   LDLCALC 68 08/11/2021   LDLDIRECT 102 (H) 12/09/2011   TRIG 156 (H) 08/11/2021   CHOLHDL 2.7 04/08/2020   Last hemoglobin A1c Lab Results  Component Value Date   HGBA1C 6.5 12/23/2021   HGBA1C 6.5 12/23/2021   Last thyroid functions Lab Results  Component Value Date   TSH 3.12 01/18/2020   Last vitamin D No results found for: "25OHVITD2", "25OHVITD3", "VD25OH" Last vitamin B12 and Folate No results found for: "VITAMINB12", "FOLATE"    No results found for any visits on 04/26/22.    Assessment & Plan   Annual wellness visit done today including the all of the following: Reviewed patient's Family Medical History Reviewed and updated list of patient's medical providers Assessment of cognitive impairment was done Assessed patient's functional ability Established a written schedule for health screening [place] Completed and Reviewed  Exercise Activities and Dietary recommendations  Goals      Pharmacy Care Plan:     CARE PLAN ENTRY (see longitudinal plan of care for additional care plan information)  Current Barriers:  Chronic Disease Management support, education, and care coordination needs related to Hypertension, Hyperlipidemia, and Diabetes   Hypertension BP Readings from Last 3 Encounters:  01/18/20  140/68  01/10/20 134/86  01/02/20 (!) 168/84  Pharmacist Clinical Goal(s): Over the next 180 days, patient will work with PharmD and providers to maintain BP goal  <140/90 Current regimen:  Metoprolol XL 25mg  daily Interventions: Reviewed home blood pressure monitoring Comprehensive medication review Patient self care activities - Over the next 180 days, patient will: Check BP periodically, document, and provide at future appointments Ensure daily salt intake < 2300 mg/day  Hyperlipidemia Lab Results  Component Value Date/Time   LDLCALC 67 11/30/2019 10:32 AM   LDLDIRECT 102 (H) 12/09/2011 10:35 AM  Pharmacist Clinical Goal(s): Over the next 180 days, patient will work with PharmD and providers to maintain LDL goal < 70 Current regimen:  Rosuvastatin 5mg  daily Interventions: Reviewed most recent lipid panel Discussed importance of statins Patient self care activities - Over the next 180 days, patient will: Continue to focus on medication adherence by pill count  Diabetes Lab Results  Component Value Date/Time   HGBA1C 6.3 (H) 11/30/2019 10:32 AM   HGBA1C 6.4 (H) 04/26/2019 11:27 AM  Pharmacist Clinical Goal(s): Over the next 180 days, patient will work with PharmD and providers to maintain A1c goal <6.5% Current regimen:  Trulicity 0.75mg  once weekly Glipizide 10mg  one-half tablet twice daily Metformin 1000mg  twice daily Interventions: Reviewed home blood sugar readings Discussed hypoglycemia and how to treat Patient self care activities - Over the next 180 days, patient will: Check blood sugar once daily, document, and provide at future appointments Contact provider with any episodes of hypoglycemia   Initial goal documentation         Immunization History  Administered Date(s) Administered   Fluad Quad(high Dose 65+) 12/26/2018, 01/18/2020, 01/21/2021, 12/23/2021   Hep A / Hep B 05/26/2018, 06/26/2018, 11/22/2018   Influenza Split 01/14/2012   Influenza,inj,Quad PF,6+ Mos 02/19/2013, 02/17/2015, 01/27/2016, 01/14/2017, 02/09/2018   PFIZER(Purple Top)SARS-COV-2 Vaccination 06/24/2019, 07/15/2019, 04/09/2020    Pneumococcal Conjugate-13 08/02/2014   Zoster, Live 04/05/2014    Health Maintenance  Topic Date Due   DTaP/Tdap/Td (1 - Tdap) Never done   Zoster Vaccines- Shingrix (1 of 2) Never done   Pneumonia Vaccine 34+ Years old (2 - PPSV23 or PCV20) 08/02/2015   OPHTHALMOLOGY EXAM  12/24/2020   COVID-19 Vaccine (4 - 2023-24 season) 12/18/2021   Diabetic kidney evaluation - Urine ACR  02/23/2022   DEXA SCAN  04/19/2048 (Originally 08/25/2007)   FOOT EXAM  05/20/2022   HEMOGLOBIN A1C  06/23/2022   Diabetic kidney evaluation - eGFR measurement  08/12/2022   Medicare Annual Wellness (AWV)  04/27/2023   INFLUENZA VACCINE  Completed   Hepatitis C Screening  Completed   HPV VACCINES  Aged Out     Discussed health benefits of physical activity, and encouraged her to engage in regular exercise appropriate for her age and condition.    Problem List Items Addressed This Visit   None Visit Diagnoses     Encounter for Medicare annual wellness exam    -  Primary       Return in 1 year (on 04/27/2023).     Renard Hamper Ria Comment, FNP

## 2022-04-26 NOTE — Progress Notes (Deleted)
Subjective:   Julie Clay is a 82 y.o. female who presents for Medicare Annual (Subsequent) preventive examination.  Review of Systems    ***       Objective:    There were no vitals filed for this visit. There is no height or weight on file to calculate BMI.     06/12/2021    2:39 PM 04/21/2021   10:46 AM 11/10/2020    1:13 PM 04/09/2020    9:42 AM 02/19/2020   10:59 AM 02/07/2020    4:00 PM 01/02/2020    7:53 AM  Advanced Directives  Does Patient Have a Medical Advance Directive? No No No Yes No No No  Would patient like information on creating a medical advance directive?  No - Patient declined No - Patient declined  No - Patient declined No - Patient declined No - Patient declined    Current Medications (verified) Outpatient Encounter Medications as of 04/26/2022  Medication Sig   acetaminophen (TYLENOL) 500 MG tablet Take 1,000 mg by mouth every 6 (six) hours as needed for moderate pain.   albuterol (VENTOLIN HFA) 108 (90 Base) MCG/ACT inhaler Inhale 2 puffs into the lungs every 6 (six) hours as needed for wheezing or shortness of breath.   Aloe Vera Leaf POWD Take 800 mg by mouth 2 (two) times daily.   Ascorbic Acid (VITAMIN C) 1000 MG tablet Take 1,000 mg by mouth 2 (two) times daily.   b complex vitamins tablet Take 1 tablet by mouth 2 (two) times daily.    Blood Glucose Monitoring Suppl (BLOOD GLUCOSE SYSTEM PAK) KIT Use as directed to monitor FSBS 1x daily. Dx: E11.9. Please dispense as Accu-Chek Aviva   Calcium Polycarbophil (FIBER-CAPS PO) Take by mouth daily at 6 (six) AM. Two po QHS   Cholecalciferol (VITAMIN D3) 2000 UNITS TABS Take 2,000 Units by mouth 2 (two) times daily.   dapagliflozin propanediol (FARXIGA) 10 MG TABS tablet Take 1 tablet (10 mg total) by mouth daily before breakfast.   diclofenac Sodium (VOLTAREN) 1 % GEL Apply 2 g topically 4 (four) times daily. (Patient taking differently: Apply 2 g topically 4 (four) times daily. prn)   Dulaglutide  (TRULICITY) 1.5 PH/1.5AV SOPN Inject 1.5 mg into the skin once a week.   fluocinonide cream (LIDEX) 6.97 % Apply 1 application topically 2 (two) times daily as needed (poison ivy/bites/hives).   Ginkgo Biloba 120 MG CAPS Take 120 mg by mouth in the morning and at bedtime.    glipiZIDE (GLUCOTROL XL) 5 MG 24 hr tablet Take 1 tablet by mouth once daily with breakfast   Glucose Blood (BLOOD GLUCOSE TEST STRIPS) STRP Use as directed to monitor FSBS 1x daily. Dx: E11.9. Please dispense as Accu-Chek Aviva   Krill Oil 1000 MG CAPS Take 1,000 mg by mouth 2 (two) times daily.    Lancets MISC Use as directed to monitor FSBS 1x daily. Dx: E11.9. Please dispense as Accu-Chek Aviva   loperamide (IMODIUM A-D) 2 MG tablet Take 1 tablet (2 mg total) by mouth 4 (four) times daily as needed for diarrhea or loose stools.   LORazepam (ATIVAN) 0.5 MG tablet Take 1 tablet (0.5 mg total) by mouth 2 (two) times daily as needed for anxiety.   metoprolol succinate (TOPROL-XL) 25 MG 24 hr tablet Take 0.5 tablets (12.5 mg total) by mouth daily.   metoprolol tartrate (LOPRESSOR) 100 MG tablet Take 1 tablet 2 hours prior to CT scan   Multiple Vitamin (MULTIVITAMIN WITH MINERALS)  TABS tablet Take 1 tablet by mouth daily.   naftifine (NAFTIN) 1 % cream Apply topically 2 (two) times daily as needed.   rosuvastatin (CRESTOR) 5 MG tablet TAKE 1 TABLET BY MOUTH ONCE DAILY FOR CHOLESTEROL   simethicone (MYLICON) 80 MG chewable tablet Chew 80 mg by mouth as needed for flatulence.    tiZANidine (ZANAFLEX) 4 MG tablet Take 1 tablet (4 mg total) by mouth every 6 (six) hours as needed for muscle spasms. (Patient taking differently: Take 4 mg by mouth every 6 (six) hours as needed for muscle spasms. 2 mg QHS)   torsemide (DEMADEX) 20 MG tablet Take 1 tablet (20 mg total) by mouth 3 (three) times a week. (Patient taking differently: Take 10 mg by mouth every other day.)   UNABLE TO FIND Please dispense custom molded foot orthotics and heel  spurs Dx: E11.30   No facility-administered encounter medications on file as of 04/26/2022.    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    Osteoarthritis    both knees, lower back, both shoulders; bone spurs to feet   Rectocele 06/25/2013   Posterior repair 11/06/13    Type II diabetes mellitus ([place])    Ventral hernia    Past Surgical History:  Procedure Laterality Date   BREAST BIOPSY Left 04/2011   BREAST BIOPSY Left 06/22/13   BREAST BIOPSY Left 09/12/2013   Procedure: BREAST BIOPSY WITH NEEDLE LOCALIZATION;  Surgeon: Jamesetta So, MD;  Location: [facility];  Service: General;  Laterality: Left;   BREAST LUMPECTOMY Left 04/2011 X 2   BREAST SURGERY N/A    Phreesia 01/17/2020   CATARACT EXTRACTION W/ INTRAOCULAR LENS  IMPLANT, BILATERAL Bilateral    CATARACT EXTRACTION, BILATERAL     on different occassions    CESAREAN [place]   COLON SURGERY N/A    2014   DILATION AND CURETTAGE OF UTERUS  11/06/2013   HAND LIGAMENT RECONSTRUCTION Right ~ 2010   HYSTEROSCOPY WITH D & C N/A 11/06/2013   Procedure: DILATATION AND CURETTAGE /HYSTEROSCOPY;  Surgeon: Jonnie Kind, MD;  Location: [facility];  Service: Gynecology;  Laterality: N/A;   INSERT / REPLACE /  REMOVE PACEMAKER  06/22/2017   LUNG LOBECTOMY Right 1988   Fungal Infection   PACEMAKER IMPLANT N/A 06/22/2017   Procedure: PACEMAKER IMPLANT;  Surgeon: Deboraha Sprang, MD;  Location: [facility];  Service: Cardiovascular;  Laterality: N/A;   PARTIAL MASTECTOMY WITH NEEDLE LOCALIZATION Right 01/02/2020   Procedure: RIGHT PARTIAL MASTECTOMY AFTER NEEDLE LOCALIZATION;  Surgeon: Aviva Signs, MD;  Location: [facility];  Service: General;  Laterality: Right;   POLYPECTOMY N/A 11/06/2013   Procedure: POLYPECTOMY (REMOVAL ENDOMETRIAL POLYP);  Surgeon: Jonnie Kind, MD;  Location: [facility];  Service: Gynecology;  Laterality: N/A;   RECTOCELE REPAIR N/A 11/06/2013   Procedure: POSTERIOR REPAIR (RECTOCELE);  Surgeon: Jonnie Kind, MD;  Location: [facility];  Service: Gynecology;  Laterality: N/A;   SHOULDER ARTHROSCOPY WITH OPEN ROTATOR CUFF REPAIR Left 07/26/2012   TONSILLECTOMY  1949   TUBAL LIGATION     Family History  Problem Relation Age of Onset   Hypertension Mother    Heart disease Mother    Diabetes Mother    Kidney disease Mother        ESRD   Cancer Mother        Bladder   Hypertension Father    Hyperlipidemia Father    Heart disease Father    Diabetes Father    Cancer Father        Stomach Cancer   Diabetes Sister    Heart disease Sister    Alzheimer's disease Paternal Aunt    Stroke Maternal Grandfather    Cancer Paternal Grandfather        stomach   Cancer Maternal Uncle        prostate   Cancer Paternal Uncle        stomach   Social History   Socioeconomic History   Marital status: Married    Spouse name: Tommi Rumps   Number of children: 2   Years of education: 12   Highest education level: Some college, no degree  Occupational History   Occupation: retired     Comment: Product manager for [employer]  Tobacco Use   Smoking status: Never   Smokeless tobacco: Never  Vaping Use   Vaping Use: Never used  Substance and Sexual Activity   Alcohol use: Not  Currently    Comment: 06/22/2017 "might have a drink twice a year"   Drug use: No   Sexual activity: Not Currently    Birth control/protection: Surgical  Other Topics Concern   Not on file  Social History Narrative   Not on file   Social Determinants of Health   Financial Resource Strain: Low Risk  (04/21/2021)   Overall Financial Resource Strain (CARDIA)    Difficulty of Paying Living Expenses: Not hard at all  Food Insecurity: No Food Insecurity (04/21/2021)   Hunger Vital Sign    Worried About Running Out of Food in the Last Year: Never true    Ran Out of Food in the Last Year: Never true  Transportation Needs: No Transportation Needs (04/21/2021)   PRAPARE - Hydrologist (Medical): No    Lack of Transportation (Non-Medical): No  Physical Activity: Inactive (04/21/2021)   Exercise Vital Sign    Days of Exercise per Week: 0 days    Minutes of Exercise per Session: 0 min  Stress: No Stress Concern Present (04/21/2021)   Worth    Feeling of Stress : Not at all  Social Connections: [place] (04/21/2021)   Social Connection and Isolation Panel [NHANES]    Frequency of Communication with Friends and Family: More than three times a week    Frequency of Social Gatherings with Friends and Family: Once a week    Attends Religious Services: More than 4 times per year    Active Member of [employer] or Organizations: Yes    Attends Archivist  Meetings: More than 4 times per year    Marital Status: Married    Tobacco Counseling Counseling given: Not Answered   Clinical Intake:                 Diabetic?yes Nutrition Risk Assessment:  Has the patient had any N/V/D within the last 2 months?  {YES/NO:21197} Does the patient have any non-healing wounds?  {YES/NO:21197} Has the patient had any unintentional weight loss or weight gain?  {YES/NO:21197}  Diabetes:  Is the  patient diabetic?  Yes  If diabetic, was a CBG obtained today?  No  Did the patient bring in their glucometer from home?  No  How often do you monitor your CBG's? ***.   Financial Strains and Diabetes Management:  Are you having any financial strains with the device, your supplies or your medication? No .  Does the patient want to be seen by Chronic Care Management for management of their diabetes?  No  Would the patient like to be referred to a Nutritionist or for Diabetic Management?  No   Diabetic Exams:  Diabetic Eye Exam: Overdue for diabetic eye exam. Pt has been advised about the importance in completing this exam. Patient advised to call and schedule an eye exam. Diabetic Foot Exam: Completed 05/20/2021           Activities of Daily Living     No data to display           Patient Care Team: Lindell Spar, MD as PCP - General (Internal Medicine) Deboraha Sprang, MD as PCP - Electrophysiology (Cardiology) Satira Sark, MD as PCP - Cardiology (Cardiology) Edythe [facility], [facility] as Pharmacist (Pharmacist)  Indicate any recent Medical Services you may have received from other than [facility] providers in the past year (date may be approximate).     Assessment:   This is a routine wellness examination for [place].  Hearing/Vision screen No results found.  Dietary issues and exercise activities discussed:     Goals Addressed   None   Depression Screen    03/29/2022    4:25 PM 12/23/2021    1:16 PM 08/17/2021    2:45 PM 05/20/2021   10:47 AM 04/23/2021    3:40 PM 04/21/2021   10:43 AM 03/30/2021   10:40 AM  PHQ 2/9 Scores  PHQ - 2 Score 0 0 0 0 0 0 0  PHQ- 9 Score 4 0 0        Fall Risk    03/29/2022    4:25 PM 12/23/2021    1:16 PM 08/17/2021    2:45 PM 05/20/2021   10:47 AM 04/23/2021    3:40 PM  [place] in the past year? 0 0 0 0 0  Number falls in past yr: 0 0 0 0 0  Injury with Fall? 0 0 0 0 0  Risk for fall due to : No Fall Risks No Fall  Risks No Fall Risks No Fall Risks   Follow up Falls evaluation completed Falls evaluation completed Falls evaluation completed Falls evaluation completed     FALL RISK PREVENTION PERTAINING TO THE HOME:  Any stairs in or around the home? {YES/NO:21197} If so, are there any without handrails? {YES/NO:21197} Home free of loose throw rugs in walkways, pet beds, electrical cords, etc? {YES/NO:21197} Adequate lighting in your home to reduce risk of falls? {YES/NO:21197}  ASSISTIVE DEVICES UTILIZED TO PREVENT FALLS:  Life alert? {YES/NO:21197} Use of a cane,  walker or w/c? {YES/NO:21197} Grab bars in the bathroom? {YES/NO:21197} Shower chair or bench in shower? {YES/NO:21197} Elevated toilet seat or a handicapped toilet? {YES/NO:21197}          04/21/2021   10:49 AM 04/09/2020    9:43 AM  6CIT Screen  What Year? 0 points 0 points  What month? 0 points 0 points  What time? 0 points 0 points  Count back from 20 0 points 0 points  Months in reverse 0 points 0 points  Repeat phrase 0 points 0 points  Total Score 0 points 0 points    Immunizations Immunization History  Administered Date(s) Administered   Fluad Quad(high Dose 65+) 12/26/2018, 01/18/2020, 01/21/2021, 12/23/2021   Hep A / Hep B 05/26/2018, 06/26/2018, 11/22/2018   Influenza Split 01/14/2012   Influenza,inj,Quad PF,6+ Mos 02/19/2013, 02/17/2015, 01/27/2016, 01/14/2017, 02/09/2018   PFIZER(Purple Top)SARS-COV-2 Vaccination 06/24/2019, 07/15/2019, 04/09/2020   Pneumococcal Conjugate-13 08/02/2014   Zoster, Live 04/05/2014    TDAP status: Due, Education has been provided regarding the importance of this vaccine. Advised may receive this vaccine at local pharmacy or Health Dept. Aware to provide a copy of the vaccination record if obtained from local pharmacy or Health Dept. Verbalized acceptance and understanding.  Flu Vaccine status: Up to date  Pneumococcal vaccine status: Due, Education has been provided regarding  the importance of this vaccine. Advised may receive this vaccine at local pharmacy or Health Dept. Aware to provide a copy of the vaccination record if obtained from local pharmacy or Health Dept. Verbalized acceptance and understanding.  Covid-19 vaccine status: Completed vaccines  Qualifies for Shingles Vaccine? Yes   Zostavax completed No   Shingrix Completed?: No.    Education has been provided regarding the importance of this vaccine. Patient has been advised to call insurance company to determine out of pocket expense if they have not yet received this vaccine. Advised may also receive vaccine at local pharmacy or Health Dept. Verbalized acceptance and understanding.  Screening Tests Health Maintenance  Topic Date Due   DTaP/Tdap/Td (1 - Tdap) Never done   Zoster Vaccines- Shingrix (1 of 2) Never done   Pneumonia Vaccine 98+ Years old (2 - PPSV23 or PCV20) 08/02/2015   OPHTHALMOLOGY EXAM  12/24/2020   COVID-19 Vaccine (4 - 2023-24 season) 12/18/2021   Diabetic kidney evaluation - Urine ACR  02/23/2022   DEXA SCAN  04/19/2048 (Originally 08/25/2007)   FOOT EXAM  05/20/2022   HEMOGLOBIN A1C  06/23/2022   Diabetic kidney evaluation - eGFR measurement  08/12/2022   Medicare Annual Wellness (AWV)  04/27/2023   INFLUENZA VACCINE  Completed   Hepatitis C Screening  Completed   HPV VACCINES  Aged Out    Health Maintenance  Health Maintenance Due  Topic Date Due   DTaP/Tdap/Td (1 - Tdap) Never done   Zoster Vaccines- Shingrix (1 of 2) Never done   Pneumonia Vaccine 27+ Years old (2 - PPSV23 or PCV20) 08/02/2015   OPHTHALMOLOGY EXAM  12/24/2020   COVID-19 Vaccine (4 - 2023-24 season) 12/18/2021   Diabetic kidney evaluation - Urine ACR  02/23/2022    Colorectal cancer screening: No longer required.   Mammogram status: No longer required due to age.  Bone Density status: Ordered  . Pt provided with contact info and advised to call to schedule appt.  Lung Cancer Screening: (Low  Dose CT Chest recommended if Age 49-80 years, 30 pack-year currently smoking OR have quit w/in 15years.) does not qualify.   Lung Cancer Screening Referral:  no  Additional Screening:  Hepatitis C Screening: does qualify; Completed   Vision Screening: Recommended annual ophthalmology exams for early detection of glaucoma and other disorders of the eye. Is the patient up to date with their annual eye exam?  {YES/NO:21197} Who is the provider or what is the name of the office in which the patient attends annual eye exams? *** If pt is not established with a provider, would they like to be referred to a provider to establish care? {YES/NO:21197}.   Dental Screening: Recommended annual dental exams for proper oral hygiene  Community Resource Referral / Chronic Care Management: CRR required this visit?  No   CCM required this visit?  No      Plan:     I have personally reviewed and noted the following in the patient's chart:   Medical and social history Use of alcohol, tobacco or illicit drugs  Current medications and supplements including opioid prescriptions. Patient is not currently taking opioid prescriptions. Functional ability and status Nutritional status Physical activity Advanced directives List of other physicians Hospitalizations, surgeries, and ER visits in previous 12 months Vitals Screenings to include cognitive, depression, and falls Referrals and appointments  In addition, I have reviewed and discussed with patient certain preventive protocols, quality metrics, and best practice recommendations. A written personalized care plan for preventive services as well as general preventive health recommendations were provided to patient.     Quentin Angst, [place]   04/26/2022

## 2022-04-29 ENCOUNTER — Telehealth: Payer: Self-pay | Admitting: Internal Medicine

## 2022-04-29 DIAGNOSIS — H9191 Unspecified hearing loss, right ear: Secondary | ICD-10-CM

## 2022-04-29 NOTE — Telephone Encounter (Signed)
Patient came by office , is looking to be referred to ENT  [facility]    Phone [phone]  fax [phone]

## 2022-05-02 ENCOUNTER — Other Ambulatory Visit: Payer: Self-pay | Admitting: Internal Medicine

## 2022-05-02 DIAGNOSIS — E1143 Type 2 diabetes mellitus with diabetic autonomic (poly)neuropathy: Secondary | ICD-10-CM

## 2022-05-03 NOTE — Telephone Encounter (Signed)
Yes you can put in the referral

## 2022-05-03 NOTE — Addendum Note (Signed)
Addended by: Marlana Salvage on: 05/03/2022 03:04 PM   Modules accepted: Orders

## 2022-05-18 ENCOUNTER — Encounter: Payer: Self-pay | Admitting: Cardiology

## 2022-05-18 ENCOUNTER — Ambulatory Visit: Payer: Medicare PPO | Attending: Cardiology | Admitting: Cardiology

## 2022-05-18 VITALS — BP 124/72 | HR 80 | Ht 65.0 in | Wt 221.8 lb

## 2022-05-18 DIAGNOSIS — I5032 Chronic diastolic (congestive) heart failure: Secondary | ICD-10-CM

## 2022-05-18 DIAGNOSIS — I2510 Atherosclerotic heart disease of native coronary artery without angina pectoris: Secondary | ICD-10-CM

## 2022-05-18 NOTE — Patient Instructions (Signed)
Medication Instructions:  Your physician recommends that you continue on your current medications as directed. Please refer to the Current Medication list given to you today.   Labwork: None today  Testing/Procedures: None today  Follow-Up: 6 months  Any Other Special Instructions Will Be Listed Below (If Applicable).  If you need a refill on your cardiac medications before your next appointment, please call your pharmacy.  

## 2022-05-18 NOTE — Progress Notes (Signed)
Cardiology Office Note  Date: 05/18/2022   ID: Julie Clay, Julie Clay [birth year], MRN [id number]  PCP:  Lindell Spar, MD  Cardiologist:  Rozann Lesches, MD Electrophysiologist:  [name], MD   Chief Complaint  Patient presents with   Cardiac follow-up    History of Present Illness: Julie Clay is a 82 y.o. female last seen in July 2023.  She is here with her husband for a follow-up visit.  Reports stable dyspnea on exertion,, no significant fluid weight gain on current regimen including Farxiga and Demadex.  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 Caryl Comes.  She had a visit with Dr. Caryl Comes in December 2023, I reviewed the note and subsequent cardiac testing that he arranged including echocardiogram and coronary CTA.  Device interrogation indicated normal function.  She had no evidence of obstructive CAD and LVEF remains normal at 60 to 65% with mild diastolic dysfunction and normal estimated RVSP.  I reviewed her most recent lab work and medical regimen.  Her last LDL was 68 on Crestor.  Blood pressure is well-controlled today.  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     albuterol (VENTOLIN HFA) 108 (90 Base) MCG/ACT inhaler Inhale 2 puffs into the lungs every 6 (six) hours as needed for wheezing or shortness of breath. 8 g 0   Aloe Vera Leaf POWD Take 800 mg by mouth 2 (two) times daily.     Ascorbic Acid (VITAMIN C) 1000 MG tablet Take 1,000 mg by mouth 2 (two) times daily.     b complex vitamins tablet Take 1 tablet by mouth 2 (two) times daily.      Blood Glucose Monitoring Suppl (BLOOD GLUCOSE SYSTEM PAK) KIT Use as directed to monitor FSBS 1x daily. Dx: E11.9. Please dispense as Accu-Chek Aviva 1 each 1   Calcium Polycarbophil (FIBER-CAPS PO) Take by mouth daily at 6 (six) AM. Two po QHS     Cholecalciferol (VITAMIN D3) 2000 UNITS TABS Take 2,000 Units by mouth 2 (two) times daily.     dapagliflozin propanediol (FARXIGA) 10 MG TABS tablet Take 1 tablet (10 mg total) by mouth daily before breakfast. 90 tablet 1   diclofenac Sodium (VOLTAREN) 1 % GEL Apply 2 g topically 4 (four) times daily. (Patient taking  differently: Apply 2 g topically 4 (four) times daily. prn) 100 g 1   Dulaglutide (TRULICITY) 1.5 WV/3.7TG SOPN Inject 1.5 mg into the skin once a week. 6 mL 3   fluocinonide cream (LIDEX) 6.26 % Apply 1 application topically 2 (two) times daily as needed (poison ivy/bites/hives). 30 g 11   Ginkgo Biloba 120 MG CAPS Take 120 mg by mouth in the morning and at bedtime.      glipiZIDE (GLUCOTROL XL) 5 MG 24 hr tablet Take 1 tablet by mouth once daily with breakfast 90 tablet 0   Glucose Blood (BLOOD GLUCOSE TEST STRIPS) STRP Use as directed to monitor FSBS 1x daily. Dx: E11.9. Please dispense as Accu-Chek Aviva 100 each 1   Krill Oil 1000 MG CAPS Take 1,000 mg by mouth 2 (two) times daily.      Lancets MISC Use as directed to monitor FSBS 1x daily. Dx: E11.9. Please dispense as Accu-Chek Aviva 100 each 1    loperamide (IMODIUM A-D) 2 MG tablet Take 1 tablet (2 mg total) by mouth 4 (four) times daily as needed for diarrhea or loose stools. 30 tablet 1   metoprolol succinate (TOPROL-XL) 25 MG 24 hr tablet Take 0.5 tablets (12.5 mg total) by mouth daily. 45 tablet 3   Multiple Vitamin (MULTIVITAMIN WITH MINERALS) TABS tablet Take 1 tablet by mouth daily.     naftifine (NAFTIN) 1 % cream Apply topically 2 (two) times daily as needed. 60 g 2   rosuvastatin (CRESTOR) 5 MG tablet TAKE 1 TABLET BY MOUTH ONCE DAILY FOR CHOLESTEROL 90 tablet 0   simethicone (MYLICON) 80 MG chewable tablet Chew 80 mg by mouth as needed for flatulence.      tiZANidine (ZANAFLEX) 4 MG tablet Take 1 tablet (4 mg total) by mouth every 6 (six) hours as needed for muscle spasms. (Patient taking differently: Take 4 mg by mouth every 6 (six) hours as needed for muscle spasms. 2 mg QHS) 180 tablet 1   torsemide (DEMADEX) 20 MG tablet Take 1 tablet (20 mg total) by mouth 3 (three) times a week. (Patient taking differently: Take 10 mg by mouth every other day.) 40 tablet 3   UNABLE TO FIND Please dispense custom molded foot orthotics and heel spurs Dx: E11.30 1 each 0   LORazepam (ATIVAN) 0.5 MG tablet Take 1 tablet (0.5 mg total) by mouth 2 (two) times daily as needed for anxiety. (Patient not taking: Reported on 05/18/2022) 15 tablet 0   No current facility-administered medications for this visit.   Allergies:  Arimidex [anastrozole], Other, Adhesive [tape], Lisinopril, Tetanus toxoids, and Tetracyclines & related   ROS: No orthopnea or PND.  Physical Exam: VS:  BP 124/72   Pulse 80   Ht 5\' 5"  (1.651 m)   Wt 221 lb 12.8 oz (100.6 kg)   SpO2 98%   BMI 36.91 kg/m , BMI Body mass index is 36.91 kg/m.  Wt Readings from Last 3 Encounters:  05/18/22 221 lb 12.8 oz (100.6 kg)  04/26/22 223 lb (101.2 kg)  03/31/22 218 lb (98.9 kg)    General: Patient appears comfortable at rest. HEENT: Conjunctiva and lids normal. Neck:  Supple, no elevated JVP or carotid bruits. Lungs: Clear to auscultation, nonlabored breathing at rest. Cardiac: Regular rate and rhythm, no S3 or significant systolic murmur. Extremities: No pitting edema.  ECG:  An ECG dated 11/11/2021 was personally reviewed today and demonstrated:  Ventricular paced rhythm with atrial tracking.  Recent Labwork: 08/11/2021: ALT 19; AST  23; BUN 16; Hemoglobin 15.3; Platelets 255; Potassium 4.6; Sodium 145 04/09/2022: Creatinine, Ser 0.90     Component Value Date/Time   CHOL 151 08/11/2021 1022   TRIG 156 (H) 08/11/2021 1022   HDL 57 08/11/2021 1022   CHOLHDL 2.7 04/08/2020 0925   VLDL 43 (H) 09/20/2016 1015   LDLCALC 68 08/11/2021 1022   LDLCALC 53 04/08/2020 0925   LDLDIRECT 102 (H) 12/09/2011 1035   Other Studies Reviewed Today:  Echocardiogram 04/07/2022:  1. Left ventricular ejection fraction, by estimation, is 60 to 65%. The  left ventricle has normal function. The left ventricle has no regional  wall motion abnormalities. There is moderate asymmetric left ventricular  hypertrophy of the basal segment.  Left ventricular diastolic parameters are consistent with Grade I  diastolic dysfunction (impaired relaxation).   2. Right ventricular systolic function is normal. The right ventricular  size is normal. There is normal pulmonary artery systolic pressure. The  estimated right ventricular systolic pressure is 48.5 mmHg.   3. Left atrial size was mildly dilated.   4. The mitral valve is abnormal. Mild mitral valve regurgitation. No  evidence of mitral stenosis. Moderate mitral annular calcification.   5. The aortic valve is tricuspid. There is mild calcification of the  aortic valve. Aortic valve regurgitation is not visualized. No aortic  stenosis is present.   6. The inferior vena cava is normal in size with greater than 50%  respiratory variability, suggesting right atrial pressure of 3 mmHg.   Coronary CTA 04/09/2022: IMPRESSION: 1.  Moderate CAD in mid LCx, 50-69% stenosis, CADRADS 3. CT FFR will be performed and reported separately.   2. Coronary calcium score is 484, which places the patient in the 80th percentile for age and sex matched control.   3. Normal coronary origins with right dominance.   4.  Severe mitral annular calcification.  Coronary CTA FFR 04/10/2022: FINDINGS: 1. Left Main: Low likelihood of hemodynamic significance.   2. LAD: Low likelihood of hemodynamic significance. 3. LCX: Low likelihood of hemodynamic significance, FFR 0.90 beyond the mid LCx lesion. 4. RCA: Not evaluated due to significant misregistration artifact.   IMPRESSION:   1. CT FFR analysis showed low likelihood of hemodynamically significant stenosis in the left circumflex artery.  Assessment and Plan:  1.  HFpEF, LVEF 60 to 65% with grade 1 diastolic dysfunction and normal estimated RVSP by recent echocardiogram.  Clinically stable in terms of fluid status with plan to continue [place] and Demadex.  2.  St. Jude pacemaker in place with history of symptomatic second-degree type II heart block.  She continues to follow with Dr. Caryl Comes and had recent device interrogation within normal limits.  3.  Chronic dyspnea on exertion, likely multifactorial.  Recent coronary CTA did not demonstrate obstructive CAD as etiology and no substantial change in cardiac structural assessment by echocardiogram.   4.  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  Continue Crestor.  Medication Adjustments/Labs and Tests Ordered: Current medicines are reviewed at length with the patient today.  Concerns regarding medicines are outlined above.   Tests Ordered: No orders of the defined types were placed in this encounter.   Medication Changes: No orders of the defined types were placed in this encounter.   Disposition:  Follow up  6 months.  Signed, Satira Sark, MD, [facility] 05/18/2022 3:45 PM    [facility] at  [facility] [address] [place], [place] [address] Phone: [phone]; Fax: [phone]

## 2022-05-21 DIAGNOSIS — M25531 Pain in right wrist: Secondary | ICD-10-CM | POA: Diagnosis not present

## 2022-06-01 LAB — HM DIABETES EYE EXAM

## 2022-06-06 ENCOUNTER — Other Ambulatory Visit: Payer: Self-pay | Admitting: Internal Medicine

## 2022-06-11 ENCOUNTER — Telehealth: Payer: Self-pay | Admitting: Internal Medicine

## 2022-06-11 ENCOUNTER — Other Ambulatory Visit: Payer: Self-pay

## 2022-06-11 DIAGNOSIS — E1143 Type 2 diabetes mellitus with diabetic autonomic (poly)neuropathy: Secondary | ICD-10-CM

## 2022-06-11 MED ORDER — TRULICITY 1.5 MG/0.5ML ~~LOC~~ SOAJ: 1.5000 mg | SUBCUTANEOUS | 3 refills | Status: DC

## 2022-06-11 NOTE — Telephone Encounter (Signed)
Patient called can not get the trulicity from the manufacture.  But needs prior authorization on both of these medications.  Dulaglutide (TRULICITY) 1.5 0000000 SOPN [id number]   FARXIGA 10 MG TABS tablet [id number]   Pharmacy: [facility]

## 2022-06-11 NOTE — Telephone Encounter (Signed)
Patient came by office in regard to Trulicity. (Previous tele)  Patient needs med sent to [facility]. They have med in stock.  Also wants a call back with info on what happens if she misses a dose. Patient next dose due Sunday 2/25 and patient will not have med

## 2022-06-11 NOTE — Telephone Encounter (Signed)
Refills sent. PA done for [place]. Not needed for Trulicity per CoverMyMeds.

## 2022-06-18 ENCOUNTER — Telehealth: Payer: Self-pay

## 2022-06-18 ENCOUNTER — Other Ambulatory Visit: Payer: Self-pay | Admitting: Internal Medicine

## 2022-06-18 DIAGNOSIS — E782 Mixed hyperlipidemia: Secondary | ICD-10-CM | POA: Diagnosis not present

## 2022-06-18 DIAGNOSIS — I5032 Chronic diastolic (congestive) heart failure: Secondary | ICD-10-CM

## 2022-06-18 DIAGNOSIS — I10 Essential (primary) hypertension: Secondary | ICD-10-CM | POA: Diagnosis not present

## 2022-06-18 DIAGNOSIS — E559 Vitamin D deficiency, unspecified: Secondary | ICD-10-CM | POA: Diagnosis not present

## 2022-06-18 DIAGNOSIS — E1143 Type 2 diabetes mellitus with diabetic autonomic (poly)neuropathy: Secondary | ICD-10-CM | POA: Diagnosis not present

## 2022-06-18 MED ORDER — DAPAGLIFLOZIN PROPANEDIOL 10 MG PO TABS: 10.0000 mg | ORAL_TABLET | Freq: Every day | ORAL | 3 refills | Status: DC

## 2022-06-18 MED ORDER — TRULICITY 1.5 MG/0.5ML ~~LOC~~ SOAJ: 1.5000 mg | SUBCUTANEOUS | 3 refills | Status: DC

## 2022-06-19 LAB — CBC WITH DIFFERENTIAL/PLATELET
Basophils Absolute: 0 10*3/uL (ref 0.0–0.2)
Basos: 1 %
EOS (ABSOLUTE): 0.2 10*3/uL (ref 0.0–0.4)
Eos: 2 %
Hematocrit: 48.2 % — ABNORMAL HIGH (ref 34.0–46.6)
Hemoglobin: 15.9 g/dL (ref 11.1–15.9)
Immature Grans (Abs): 0 10*3/uL (ref 0.0–0.1)
Immature Granulocytes: 0 %
Lymphocytes Absolute: 1.9 10*3/uL (ref 0.7–3.1)
Lymphs: 24 %
MCH: 27.6 pg (ref 26.6–33.0)
MCHC: 33 g/dL (ref 31.5–35.7)
MCV: 84 fL (ref 79–97)
Monocytes Absolute: 0.7 10*3/uL (ref 0.1–0.9)
Monocytes: 8 %
Neutrophils Absolute: 5.3 10*3/uL (ref 1.4–7.0)
Neutrophils: 65 %
Platelets: 258 10*3/uL (ref 150–450)
RBC: 5.76 x10E6/uL — ABNORMAL HIGH (ref 3.77–5.28)
RDW: 13.4 % (ref 11.7–15.4)
WBC: 8.1 10*3/uL (ref 3.4–10.8)

## 2022-06-19 LAB — CMP14+EGFR
ALT: 17 IU/L (ref 0–32)
AST: 19 IU/L (ref 0–40)
Albumin/Globulin Ratio: 1.9 (ref 1.2–2.2)
Albumin: 4.4 g/dL (ref 3.8–4.8)
Alkaline Phosphatase: 62 IU/L (ref 44–121)
BUN/Creatinine Ratio: 17 (ref 12–28)
BUN: 15 mg/dL (ref 8–27)
Bilirubin Total: 0.4 mg/dL (ref 0.0–1.2)
CO2: 22 mmol/L (ref 20–29)
Calcium: 9.9 mg/dL (ref 8.7–10.3)
Chloride: 106 mmol/L (ref 96–106)
Creatinine, Ser: 0.86 mg/dL (ref 0.57–1.00)
Globulin, Total: 2.3 g/dL (ref 1.5–4.5)
Glucose: 134 mg/dL — ABNORMAL HIGH (ref 70–99)
Potassium: 4.6 mmol/L (ref 3.5–5.2)
Sodium: 144 mmol/L (ref 134–144)
Total Protein: 6.7 g/dL (ref 6.0–8.5)
eGFR: 69 mL/min/{1.73_m2} (ref >59)

## 2022-06-19 LAB — HEMOGLOBIN A1C
Est. average glucose Bld gHb Est-mCnc: 140 mg/dL
Hgb A1c MFr Bld: 6.5 % — ABNORMAL HIGH (ref 4.8–5.6)

## 2022-06-19 LAB — LIPID PANEL
Chol/HDL Ratio: 3.6 ratio (ref 0.0–4.4)
Cholesterol, Total: 170 mg/dL (ref 100–199)
HDL: 47 mg/dL (ref >39)
LDL Chol Calc (NIH): 78 mg/dL (ref 0–99)
Triglycerides: 280 mg/dL — ABNORMAL HIGH (ref 0–149)
VLDL Cholesterol Cal: 45 mg/dL — ABNORMAL HIGH (ref 5–40)

## 2022-06-19 LAB — VITAMIN D 25 HYDROXY (VIT D DEFICIENCY, FRACTURES): Vit D, 25-Hydroxy: 54.8 ng/mL (ref 30.0–100.0)

## 2022-06-19 LAB — TSH: TSH: 2.98 u[IU]/mL (ref 0.450–4.500)

## 2022-06-21 ENCOUNTER — Other Ambulatory Visit: Payer: Self-pay | Admitting: Internal Medicine

## 2022-06-21 DIAGNOSIS — E1143 Type 2 diabetes mellitus with diabetic autonomic (poly)neuropathy: Secondary | ICD-10-CM

## 2022-06-21 MED ORDER — SEMAGLUTIDE (1 MG/DOSE) 4 MG/3ML ~~LOC~~ SOPN: 1.0000 mg | PEN_INJECTOR | SUBCUTANEOUS | 1 refills | Status: DC

## 2022-06-21 NOTE — Telephone Encounter (Signed)
Pt aware.

## 2022-06-22 ENCOUNTER — Other Ambulatory Visit: Payer: Self-pay | Admitting: Student

## 2022-06-23 ENCOUNTER — Ambulatory Visit: Payer: Medicare PPO

## 2022-06-23 DIAGNOSIS — I441 Atrioventricular block, second degree: Secondary | ICD-10-CM

## 2022-06-23 LAB — CUP PACEART REMOTE DEVICE CHECK
Battery Remaining Longevity: 47 mo
Battery Remaining Percentage: 47 %
Battery Voltage: 2.98 V
Brady Statistic AP VP Percent: 6.1 %
Brady Statistic AP VS Percent: 1 %
Brady Statistic AS VP Percent: 94 %
Brady Statistic AS VS Percent: 1 %
Brady Statistic RA Percent Paced: 5.9 %
Brady Statistic RV Percent Paced: 99 %
Date Time Interrogation Session: 20240306021847
Implantable Lead Connection Status: 753985
Implantable Lead Connection Status: 753985
Implantable Lead Implant Date: 20190306
Implantable Lead Implant Date: 20190306
Implantable Lead Location: 753859
Implantable Lead Location: 753860
Implantable Lead Model: 5076
Implantable Lead Model: 5076
Implantable Pulse Generator Implant Date: 20190306
Lead Channel Impedance Value: 340 Ohm
Lead Channel Impedance Value: 450 Ohm
Lead Channel Pacing Threshold Amplitude: 0.5 V
Lead Channel Pacing Threshold Amplitude: 0.75 V
Lead Channel Pacing Threshold Pulse Width: 0.5 ms
Lead Channel Pacing Threshold Pulse Width: 0.5 ms
Lead Channel Sensing Intrinsic Amplitude: 3.6 mV
Lead Channel Sensing Intrinsic Amplitude: 6.8 mV
Lead Channel Setting Pacing Amplitude: 2 V
Lead Channel Setting Pacing Amplitude: 2.5 V
Lead Channel Setting Pacing Pulse Width: 0.5 ms
Lead Channel Setting Sensing Sensitivity: 4 mV
Pulse Gen Model: 2272
Pulse Gen Serial Number: 9001027

## 2022-06-25 ENCOUNTER — Ambulatory Visit (INDEPENDENT_AMBULATORY_CARE_PROVIDER_SITE_OTHER): Payer: Medicare PPO | Admitting: Internal Medicine

## 2022-06-25 ENCOUNTER — Encounter: Payer: Self-pay | Admitting: Internal Medicine

## 2022-06-25 VITALS — BP 138/70 | HR 98 | Ht 65.0 in | Wt 200.8 lb

## 2022-06-25 DIAGNOSIS — E1143 Type 2 diabetes mellitus with diabetic autonomic (poly)neuropathy: Secondary | ICD-10-CM

## 2022-06-25 DIAGNOSIS — J0110 Acute frontal sinusitis, unspecified: Secondary | ICD-10-CM | POA: Diagnosis not present

## 2022-06-25 DIAGNOSIS — H9191 Unspecified hearing loss, right ear: Secondary | ICD-10-CM

## 2022-06-25 DIAGNOSIS — L237 Allergic contact dermatitis due to plants, except food: Secondary | ICD-10-CM

## 2022-06-25 DIAGNOSIS — Z0001 Encounter for general adult medical examination with abnormal findings: Secondary | ICD-10-CM | POA: Diagnosis not present

## 2022-06-25 DIAGNOSIS — I5032 Chronic diastolic (congestive) heart failure: Secondary | ICD-10-CM | POA: Diagnosis not present

## 2022-06-25 DIAGNOSIS — Z23 Encounter for immunization: Secondary | ICD-10-CM | POA: Diagnosis not present

## 2022-06-25 DIAGNOSIS — I10 Essential (primary) hypertension: Secondary | ICD-10-CM | POA: Diagnosis not present

## 2022-06-25 DIAGNOSIS — J0100 Acute maxillary sinusitis, unspecified: Secondary | ICD-10-CM | POA: Insufficient documentation

## 2022-06-25 DIAGNOSIS — L304 Erythema intertrigo: Secondary | ICD-10-CM | POA: Diagnosis not present

## 2022-06-25 MED ORDER — CLOTRIMAZOLE-BETAMETHASONE 1-0.05 % EX CREA: 1.0000 | TOPICAL_CREAM | Freq: Every day | CUTANEOUS | 0 refills | Status: DC

## 2022-06-25 MED ORDER — FLUOCINONIDE 0.05 % EX CREA: 1.0000 | TOPICAL_CREAM | Freq: Two times a day (BID) | CUTANEOUS | 11 refills | Status: AC | PRN

## 2022-06-25 MED ORDER — AMOXICILLIN-POT CLAVULANATE 875-125 MG PO TABS: 1.0000 | ORAL_TABLET | Freq: Two times a day (BID) | ORAL | 0 refills | Status: DC

## 2022-06-25 NOTE — Assessment & Plan Note (Signed)
Started empiric Augmentin as she has had persistent symptoms despite symptomatic treatment Advised to use humidifier and/or vaporizer for nasal congestion Avoid using phenylephrine due to HTN Nasal saline spray as needed

## 2022-06-25 NOTE — Assessment & Plan Note (Addendum)
Lab Results  Component Value Date   HGBA1C 6.5 (H) 06/18/2022   Well-controlled On glipizide 5 mg QD, Farxiga 10 mg QD and Trulicity 1.5 mg qw Switched to Ozempic due to availability concern of Trulicity Advised to follow diabetic diet On statin F/u CMP and lipid panel Diabetic eye exam: Advised to follow up with Ophthalmology for diabetic eye exam  Has diabetic neuropathy, intermittent numbness of feet - has had diabetic shoes, but needs new pair, will contact with DME information

## 2022-06-25 NOTE — Assessment & Plan Note (Signed)
Appears euvolemic currently ?Takes Demadex every other day ?On Farxiga ?Followed by Cardiology ?

## 2022-06-25 NOTE — Assessment & Plan Note (Addendum)
BP Readings from Last 1 Encounters:  06/25/22 138/70   Well-controlled Counseled for compliance with the medications Advised DASH diet and moderate exercise/walking as tolerated

## 2022-06-25 NOTE — Assessment & Plan Note (Addendum)
Physical exam as documented. Fasting blood tests reviewed today. PCV20 vaccine today. Advised to get Shingrix and Tdap vaccines at local pharmacy.

## 2022-06-25 NOTE — Assessment & Plan Note (Signed)
Uses Lidex cream as needed, refilled

## 2022-06-25 NOTE — Addendum Note (Signed)
Addended byIhor Dow on: 06/25/2022 03:37 PM   Modules accepted: Level of Service

## 2022-06-25 NOTE — Assessment & Plan Note (Addendum)
Has had groin area intertrigo Has used Lotrisone in the past, refilled

## 2022-06-25 NOTE — Progress Notes (Addendum)
Established Patient Office Visit  Subjective:  Patient ID: Julie Clay, female    DOB: 1941/01/08  Age: 82 y.o. MRN: [id number]  CC:  Chief Complaint  Patient presents with   Annual Exam    Patient is here for an annual exam, she states she is having so much pressure in her face and dryness but she's been taking otc for sinus problems and will not go away. Discuss diabetes medication.     HPI Julie Clay is a 82 y.o. female with past medical history of HTN, asthma, type II DM with neuropathy, GERD, chronic diarrhea, breast ca. and GAD who presents for annual physical.  Type II DM: She has been taking glipizide, Farxiga and Trulicity. Her HbA1c is 6.5 now, stable. Her diarrhea has slightly improved since stopping metformin.  Denies any polyuria or polydipsia currently.  She takes Crestor for HLD.   She has history of Mobitz 2 second-degree heart block, s/p pacemaker placement and HFpEF. BP is well-controlled. Takes medications regularly. Patient denies headache, dizziness, chest pain, dyspnea or palpitations.   She has had nasal congestion, sinus pressure related headache and facial pain for about a month now.  She initially had symptoms only on the right side, but has progressed to bilateral now.  She denies any fever or chills.  She has tried OTC decongestant without much relief.  Denies any dyspnea or wheezing currently.   Past Medical History:  Diagnosis Date   2nd degree AV block 06/22/2017   Arthritis    Asthma    Bone spur    Breast cancer, left breast ([place])    2012   Breast cancer, right breast ([place])    2021   Chronic bronchitis (HCC)    COPD (chronic obstructive pulmonary disease) ([place])    Diabetes mellitus without complication ([place])    Phreesia [id number]   Diastolic heart failure ([place])    Essential hypertension    Fatty liver 2013   GERD (gastroesophageal reflux disease)    HSV (herpes simplex virus) infection    Hyperlipidemia    Lung nodule seen on imaging  study 2013   Lymphedema    Left arm   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    Osteoarthritis    both knees, lower back, both shoulders; bone spurs to feet   Rectocele 06/25/2013   Posterior repair 11/06/13    Type II diabetes mellitus ([place])    Ventral hernia     Past Surgical History:  Procedure Laterality Date   BREAST BIOPSY Left 04/2011   BREAST BIOPSY Left 06/22/13   BREAST BIOPSY Left 09/12/2013   Procedure: BREAST BIOPSY WITH NEEDLE LOCALIZATION;  Surgeon: Jamesetta So, MD;  Location: [facility];  Service: General;  Laterality: Left;   BREAST LUMPECTOMY Left 04/2011 X 2   BREAST SURGERY N/A    Phreesia 01/17/2020   CATARACT EXTRACTION W/ INTRAOCULAR LENS  IMPLANT, BILATERAL Bilateral    CATARACT EXTRACTION, BILATERAL     on different occassions    [place]   COLON SURGERY N/A    2014   DILATION AND CURETTAGE OF UTERUS  11/06/2013   HAND LIGAMENT RECONSTRUCTION Right ~ 2010   HYSTEROSCOPY WITH D & C N/A 11/06/2013   Procedure: DILATATION AND CURETTAGE /HYSTEROSCOPY;  Surgeon: Jonnie Kind, MD;  Location: [facility];  Service: Gynecology;  Laterality: N/A;   INSERT / REPLACE / REMOVE PACEMAKER  06/22/2017   LUNG LOBECTOMY Right  1988   Fungal Infection   PACEMAKER IMPLANT N/A 06/22/2017   Procedure: PACEMAKER IMPLANT;  Surgeon: Deboraha Sprang, MD;  Location: [facility];  Service: Cardiovascular;  Laterality: N/A;   PARTIAL MASTECTOMY WITH NEEDLE LOCALIZATION Right 01/02/2020   Procedure: RIGHT PARTIAL MASTECTOMY AFTER NEEDLE LOCALIZATION;  Surgeon: Aviva Signs, MD;  Location: [facility];  Service: General;  Laterality: Right;   POLYPECTOMY N/A 11/06/2013   Procedure: POLYPECTOMY (REMOVAL ENDOMETRIAL POLYP);  Surgeon: Jonnie Kind, MD;  Location: [facility];  Service: Gynecology;  Laterality: N/A;   RECTOCELE REPAIR N/A 11/06/2013   Procedure: POSTERIOR REPAIR (RECTOCELE);  Surgeon: Jonnie Kind, MD;  Location: [facility];  Service: Gynecology;  Laterality: N/A;    SHOULDER ARTHROSCOPY WITH OPEN ROTATOR CUFF REPAIR Left 07/26/2012   TONSILLECTOMY  1949   TUBAL LIGATION      Family History  Problem Relation Age of Onset   Hypertension Mother    Heart disease Mother    Diabetes Mother    Kidney disease Mother        ESRD   Cancer Mother        Bladder   Hypertension Father    Hyperlipidemia Father    Heart disease Father    Diabetes Father    Cancer Father        Stomach Cancer   Diabetes Sister    Heart disease Sister    Alzheimer's disease Paternal Aunt    Stroke Maternal Grandfather    Cancer Paternal Grandfather        stomach   Cancer Maternal Uncle        prostate   Cancer Paternal Uncle        stomach    Social History   Socioeconomic History   Marital status: Married    Spouse name: Tommi Rumps   Number of children: 2   Years of education: 12   Highest education level: Some college, no degree  Occupational History   Occupation: retired     Comment: Product manager for [employer]  Tobacco Use   Smoking status: Never   Smokeless tobacco: Never  Vaping Use   Vaping Use: Never used  Substance and Sexual Activity   Alcohol use: Not Currently    Comment: Twice a year   Drug use: No   Sexual activity: Not Currently    Birth control/protection: Surgical  Other Topics Concern   Not on file  Social History Narrative   Not on file   Social Determinants of Health   Financial Resource Strain: Low Risk  (04/26/2022)   Overall Financial Resource Strain (CARDIA)    Difficulty of Paying Living Expenses: Not very hard  Food Insecurity: No Food Insecurity (04/26/2022)   Hunger Vital Sign    Worried About Running Out of Food in the Last Year: Never true    Ran Out of Food in the Last Year: Never true  Transportation Needs: No Transportation Needs (04/26/2022)   PRAPARE - Hydrologist (Medical): No    Lack of Transportation (Non-Medical): No  Physical Activity: Inactive (04/26/2022)   Exercise Vital  Sign    Days of Exercise per Week: 0 days    Minutes of Exercise per Session: 0 min  Stress: No Stress Concern Present (04/26/2022)   [place]    Feeling of Stress : Only a little  Social Connections: Moderately Integrated (04/26/2022)   Social Connection and  Isolation Panel [NHANES]    Frequency of Communication with Friends and Family: Once a week    Frequency of Social Gatherings with Friends and Family: Once a week    Attends Religious Services: More than 4 times per year    Active Member of [employer] or Organizations: Yes    Attends Music therapist: More than 4 times per year    Marital Status: Married  Human resources officer Violence: Not At Risk (04/26/2022)   Humiliation, Afraid, Rape, and Kick questionnaire    Fear of Current or Ex-Partner: No    Emotionally Abused: No    Physically Abused: No    Sexually Abused: No    Outpatient Medications Prior to Visit  Medication Sig Dispense Refill   acetaminophen (TYLENOL) 500 MG tablet Take 1,000 mg by mouth every 6 (six) hours as needed for moderate pain.     albuterol (VENTOLIN HFA) 108 (90 Base) MCG/ACT inhaler Inhale 2 puffs into the lungs every 6 (six) hours as needed for wheezing or shortness of breath. 8 g 0   Aloe Vera Leaf POWD Take 800 mg by mouth 2 (two) times daily.     Ascorbic Acid (VITAMIN C) 1000 MG tablet Take 1,000 mg by mouth 2 (two) times daily.     b complex vitamins tablet Take 1 tablet by mouth 2 (two) times daily.      Blood Glucose Monitoring Suppl (BLOOD GLUCOSE SYSTEM PAK) KIT Use as directed to monitor FSBS 1x daily. Dx: E11.9. Please dispense as Accu-Chek Aviva 1 each 1   Calcium Polycarbophil (FIBER-CAPS PO) Take by mouth daily at 6 (six) AM. Two po QHS     Cholecalciferol (VITAMIN D3) 2000 UNITS TABS Take 2,000 Units by mouth 2 (two) times daily.     dapagliflozin propanediol (FARXIGA) 10 MG TABS tablet Take 1 tablet (10 mg total) by mouth  daily before breakfast. 90 tablet 3   diclofenac Sodium (VOLTAREN) 1 % GEL Apply 2 g topically 4 (four) times daily. (Patient taking differently: Apply 2 g topically 4 (four) times daily. prn) 100 g 1   Ginkgo Biloba 120 MG CAPS Take 120 mg by mouth in the morning and at bedtime.      glipiZIDE (GLUCOTROL XL) 5 MG 24 hr tablet Take 1 tablet by mouth once daily with breakfast 90 tablet 0   Glucose Blood (BLOOD GLUCOSE TEST STRIPS) STRP Use as directed to monitor FSBS 1x daily. Dx: E11.9. Please dispense as Accu-Chek Aviva 100 each 1   Krill Oil 1000 MG CAPS Take 1,000 mg by mouth 2 (two) times daily.      Lancets MISC Use as directed to monitor FSBS 1x daily. Dx: E11.9. Please dispense as Accu-Chek Aviva 100 each 1   loperamide (IMODIUM A-D) 2 MG tablet Take 1 tablet (2 mg total) by mouth 4 (four) times daily as needed for diarrhea or loose stools. 30 tablet 1   LORazepam (ATIVAN) 0.5 MG tablet Take 1 tablet (0.5 mg total) by mouth 2 (two) times daily as needed for anxiety. (Patient not taking: Reported on 05/18/2022) 15 tablet 0   metoprolol succinate (TOPROL-XL) 25 MG 24 hr tablet Take 1/2 (one-half) tablet by mouth once daily 45 tablet 2   Multiple Vitamin (MULTIVITAMIN WITH MINERALS) TABS tablet Take 1 tablet by mouth daily.     naftifine (NAFTIN) 1 % cream Apply topically 2 (two) times daily as needed. 60 g 2   rosuvastatin (CRESTOR) 5 MG tablet TAKE 1 TABLET BY MOUTH  ONCE DAILY FOR CHOLESTEROL 90 tablet 0   Semaglutide, 1 MG/DOSE, 4 MG/3ML SOPN Inject 1 mg as directed once a week. 9 mL 1   simethicone (MYLICON) 80 MG chewable tablet Chew 80 mg by mouth as needed for flatulence.      tiZANidine (ZANAFLEX) 4 MG tablet Take 1 tablet (4 mg total) by mouth every 6 (six) hours as needed for muscle spasms. (Patient taking differently: Take 4 mg by mouth every 6 (six) hours as needed for muscle spasms. 2 mg QHS) 180 tablet 1   torsemide (DEMADEX) 20 MG tablet Take 1 tablet (20 mg total) by mouth 3  (three) times a week. (Patient taking differently: Take 10 mg by mouth every other day.) 40 tablet 3   UNABLE TO FIND Please dispense custom molded foot orthotics and heel spurs Dx: E11.30 1 each 0   fluocinonide cream (LIDEX) [id number] % Apply 1 application topically 2 (two) times daily as needed (poison ivy/bites/hives). 30 g 11   No facility-administered medications prior to visit.    Allergies  Allergen Reactions   Arimidex [Anastrozole] Other (See Comments)    Arthralgias and myalgias.  Improved with 3 week hiatus from drug.  Categorical side effect of drug class.   Other Hives and Itching   Adhesive [Tape] Other (See Comments)    "red and blistered"   Lisinopril     Dry hacking cough and tickle   Tetanus Toxoids    Tetracyclines & Related Other (See Comments)    unknown    ROS Review of Systems  Constitutional:  Negative for chills and fever.  HENT:  Positive for congestion, sinus pressure and sinus pain. Negative for sore throat.   Eyes:  Negative for pain and discharge.  Respiratory:  Negative for cough and shortness of breath.   Cardiovascular:  Negative for chest pain and palpitations.  Gastrointestinal:  Positive for diarrhea. Negative for abdominal pain, nausea and vomiting.  Endocrine: Negative for polydipsia and polyuria.  Genitourinary:  Negative for dysuria and hematuria.  Musculoskeletal:  Negative for neck pain and neck stiffness.  Skin:  Positive for color change (Left face area mole). Negative for rash.  Neurological:  Negative for dizziness and weakness.  Psychiatric/Behavioral:  Negative for agitation and behavioral problems. The patient is nervous/anxious.       Objective:    Physical Exam Vitals reviewed.  Constitutional:      General: She is not in acute distress.    Appearance: She is obese. She is not diaphoretic.  HENT:     Head: Normocephalic and atraumatic.     Nose: Congestion present.     Right Sinus: Frontal sinus tenderness present.      Left Sinus: Frontal sinus tenderness present.     Mouth/Throat:     Mouth: Mucous membranes are moist.     Pharynx: No posterior oropharyngeal erythema.  Eyes:     General: No scleral icterus.    Extraocular Movements: Extraocular movements intact.  Cardiovascular:     Rate and Rhythm: Normal rate and regular rhythm.     Pulses: Normal pulses.     Heart sounds: Normal heart sounds. No murmur heard. Pulmonary:     Breath sounds: Normal breath sounds. No wheezing or rales.  Abdominal:     Palpations: Abdomen is soft.     Tenderness: There is no abdominal tenderness.  Musculoskeletal:     Cervical back: Neck supple. No tenderness.     Right lower leg: No edema.  Left lower leg: No edema.  Skin:    General: Skin is warm.     Findings: Lesion (Brownish papule over left side of face and left upper arm - likely actinic keratoses) present. No rash.  Neurological:     General: No focal deficit present.     Mental Status: She is alert and oriented to person, place, and time.     Cranial Nerves: No cranial nerve deficit.     Sensory: No sensory deficit.     Motor: No weakness.  Psychiatric:        Mood and Affect: Mood normal.        Behavior: Behavior normal.     BP 138/70 (BP Location: Right Arm, Cuff Size: Normal)   Pulse 98   Ht '5\' 5"'$  (1.651 m)   Wt 200 lb 12.8 oz (91.1 kg)   SpO2 93%   BMI 33.41 kg/m  Wt Readings from Last 3 Encounters:  06/25/22 200 lb 12.8 oz (91.1 kg)  05/18/22 221 lb 12.8 oz (100.6 kg)  04/26/22 223 lb (101.2 kg)    Lab Results  Component Value Date   TSH 2.980 06/18/2022   Lab Results  Component Value Date   WBC 8.1 06/18/2022   HGB 15.9 06/18/2022   HCT 48.2 (H) 06/18/2022   MCV 84 06/18/2022   PLT 258 06/18/2022   Lab Results  Component Value Date   NA 144 06/18/2022   K 4.6 06/18/2022   CO2 22 06/18/2022   GLUCOSE 134 (H) 06/18/2022   BUN 15 06/18/2022   CREATININE 0.86 06/18/2022   BILITOT 0.4 06/18/2022   ALKPHOS 62  06/18/2022   AST 19 06/18/2022   ALT 17 06/18/2022   PROT 6.7 06/18/2022   ALBUMIN 4.4 06/18/2022   CALCIUM 9.9 06/18/2022   ANIONGAP 7 06/12/2021   EGFR 69 06/18/2022   Lab Results  Component Value Date   CHOL 170 06/18/2022   Lab Results  Component Value Date   HDL 47 06/18/2022   Lab Results  Component Value Date   LDLCALC 78 06/18/2022   Lab Results  Component Value Date   TRIG 280 (H) 06/18/2022   Lab Results  Component Value Date   CHOLHDL 3.6 06/18/2022   Lab Results  Component Value Date   HGBA1C 6.5 (H) 06/18/2022      Assessment & Plan:   (HFpEF) heart failure with preserved ejection fraction ([place]) Appears euvolemic currently Takes Demadex every other day On Farxiga Followed by Cardiology  Essential hypertension, benign BP Readings from Last 1 Encounters:  06/25/22 138/70   Well-controlled Counseled for compliance with the medications Advised DASH diet and moderate exercise/walking as tolerated  Encounter for general adult medical examination with abnormal findings Physical exam as documented. Fasting blood tests reviewed today. PCV20 vaccine today. Advised to get Shingrix and Tdap vaccines at local pharmacy.   DM (diabetes mellitus) ([place]) Lab Results  Component Value Date   HGBA1C 6.5 (H) 06/18/2022   Well-controlled On glipizide 5 mg QD, Farxiga 10 mg QD and Trulicity 1.5 mg qw Switched to [employer] due to availability concern of Trulicity Advised to follow diabetic diet On statin F/u CMP and lipid panel Diabetic eye exam: Advised to follow up with Ophthalmology for diabetic eye exam  Has diabetic neuropathy, intermittent numbness of feet - has had diabetic shoes, but needs new pair, will contact with DME information  Acute non-recurrent frontal sinusitis Started empiric Augmentin as she has had persistent symptoms despite symptomatic treatment Advised  to use humidifier and/or vaporizer for nasal congestion Avoid using  phenylephrine due to HTN Nasal saline spray as needed  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    Dispense:  30 g    Refill:  11   clotrimazole-betamethasone (LOTRISONE) cream    Sig: Apply 1 Application topically daily.    Dispense:  30 g    Refill:  0   amoxicillin-clavulanate (AUGMENTIN) 875-125 MG tablet    Sig: Take 1 tablet by mouth 2 (two) times daily.    Dispense:  14 tablet    Refill:  0    Follow-up: Return in about 6 months (around 12/26/2022) for DM.    Lindell Spar, MD

## 2022-06-25 NOTE — Assessment & Plan Note (Signed)
Has chronic hearing problem Referred to ENT specialist

## 2022-06-25 NOTE — Patient Instructions (Signed)
Please start taking Augmentin as prescribed for acute sinusitis. Please use humidifier and/or vaporizer for nasal congestion.  Please continue to take medications as prescribed.  Please continue to follow low carb diet and perform moderate exercise/walking at least 150 mins/week.

## 2022-06-28 LAB — MICROALBUMIN / CREATININE URINE RATIO
Creatinine, Urine: 34.3 mg/dL
Microalb/Creat Ratio: 13 mg/g creat (ref 0–29)
Microalbumin, Urine: 4.6 ug/mL

## 2022-07-07 IMAGING — CT CT ABD-PELV W/ CM
2 of 5 series · 16 of 46 positions shown, 18 images · IV contrast (Omnipaque or Isovue)
Comparison: None.

CLINICAL DATA: Abdominal pain, acute, nonlocalized

EXAM:
CT ABDOMEN AND PELVIS WITH CONTRAST
TECHNIQUE: Multidetector CT imaging of the abdomen and pelvis was performed
using the standard protocol following bolus administration of
intravenous contrast.

[Series 2: axial st · axial · 0.87mm/px · z∈[+566,+1051]mm · 13 of 111 slices shown, 15 images]
[im 7/111  soft-tissue]
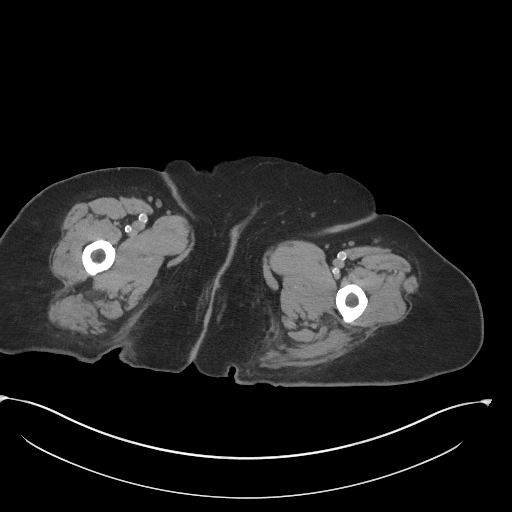
[im 7/111  bone]
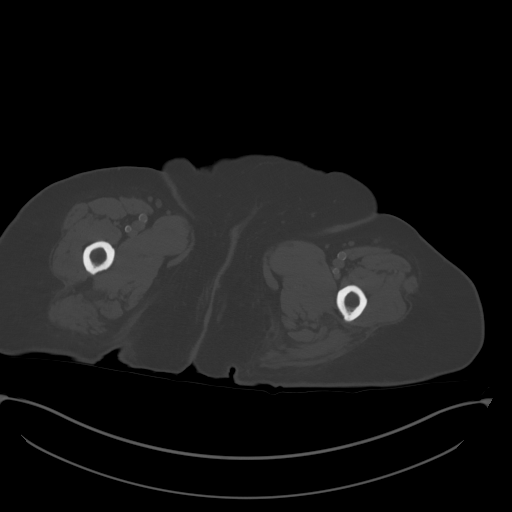
[im 13/111  soft-tissue]
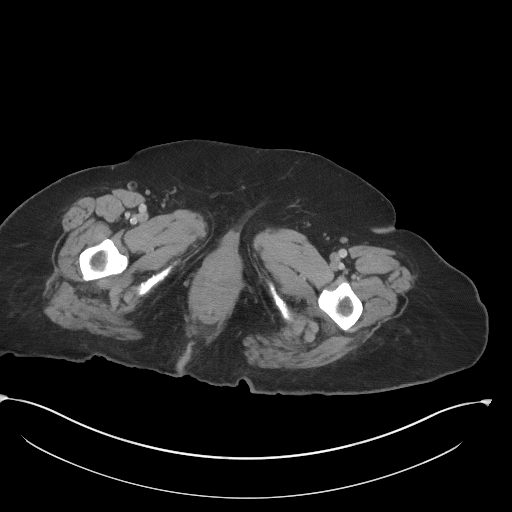
[im 25/111  soft-tissue]
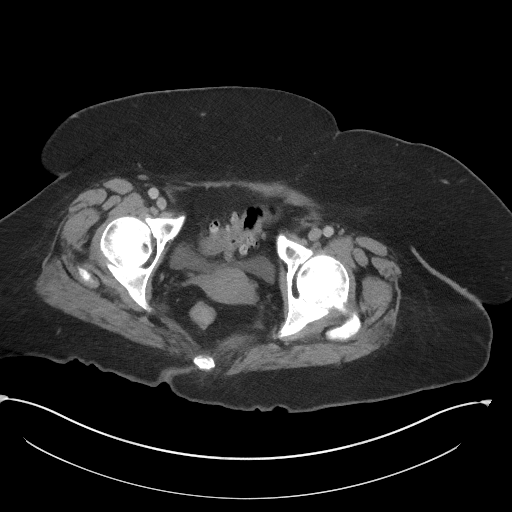
[im 31/111  soft-tissue]
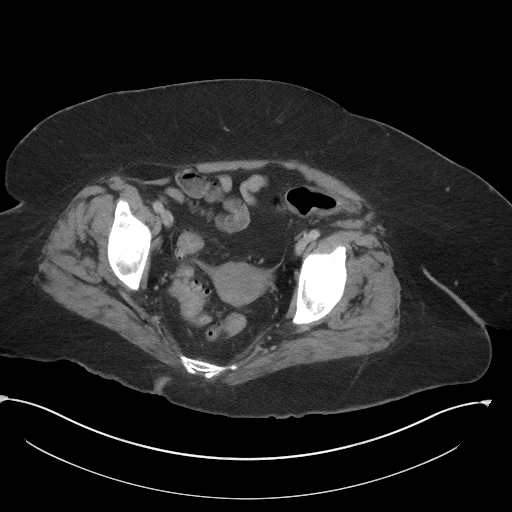
[im 37/111  soft-tissue]
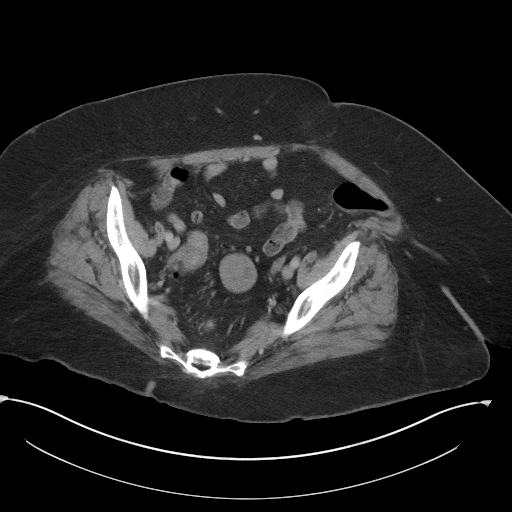
[im 49/111  soft-tissue]
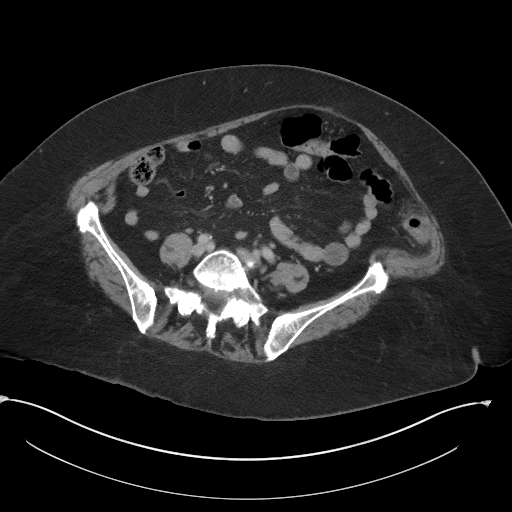
[im 56/111  soft-tissue]
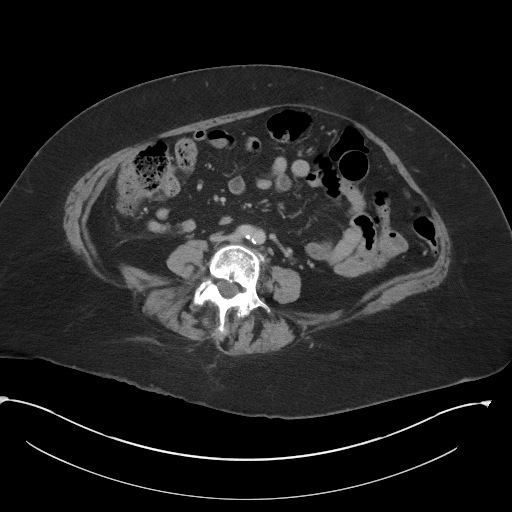
[im 62/111  soft-tissue]
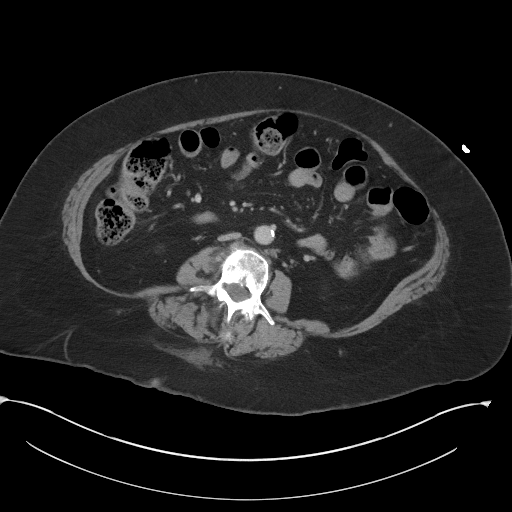
[im 74/111  soft-tissue]
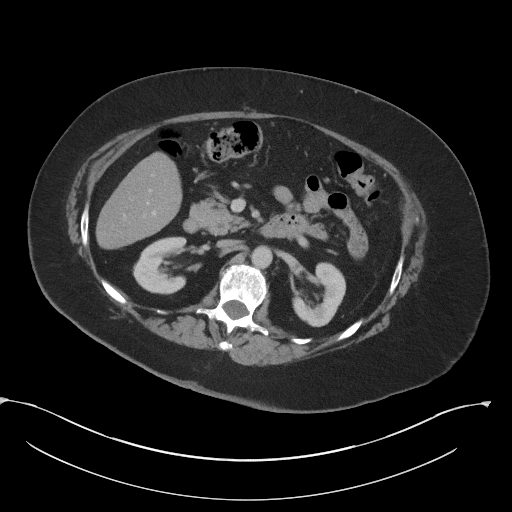
[im 74/111  bone]
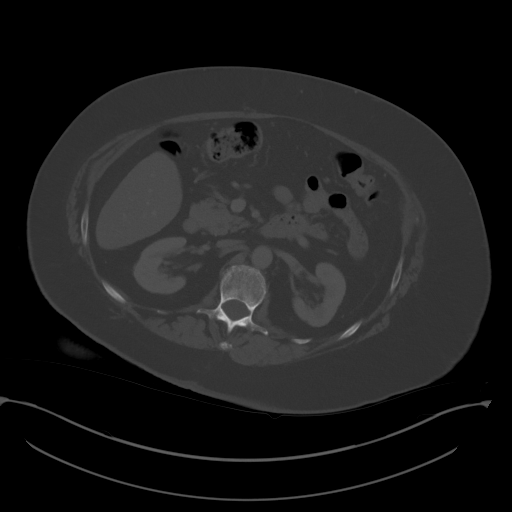
[im 80/111  soft-tissue]
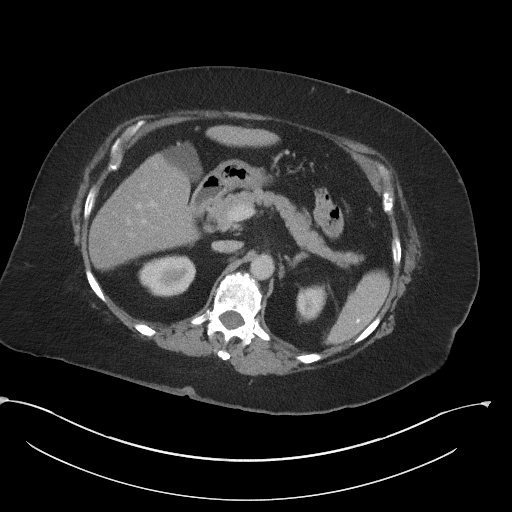
[im 86/111  soft-tissue]
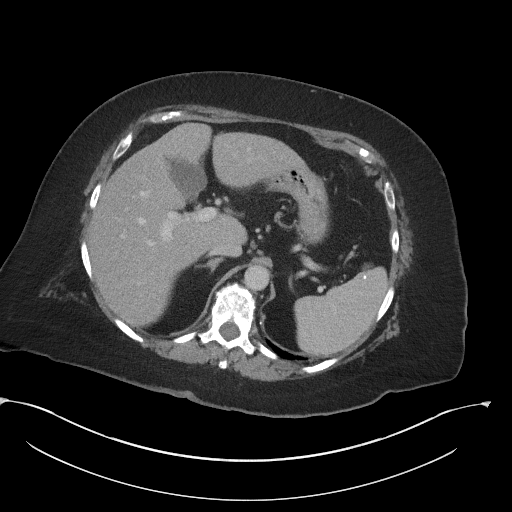
[im 98/111  soft-tissue]
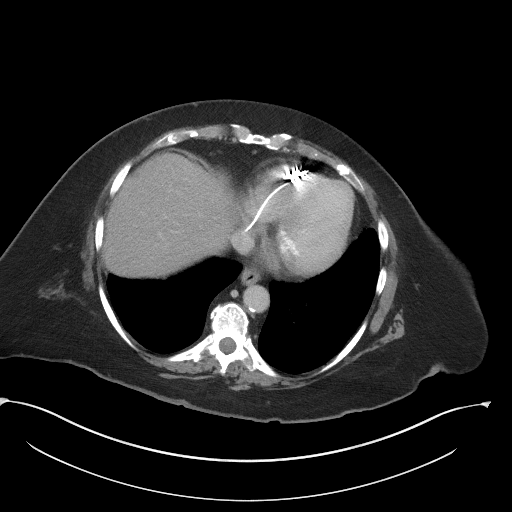
[im 104/111  soft-tissue]
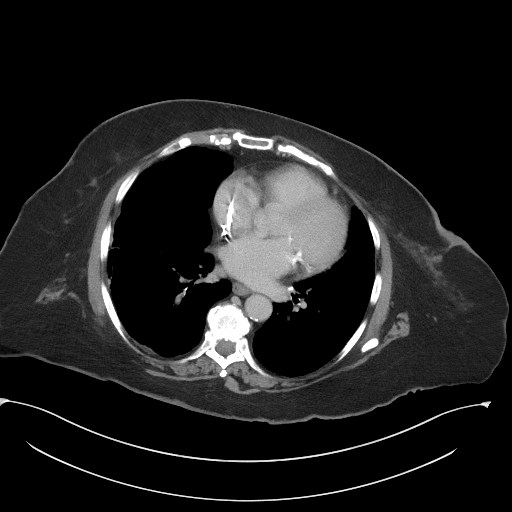

[Series 5: coronal st · coronal · 0.88mm/px · 3 of 106 slices shown]
[im 36/106  soft-tissue]
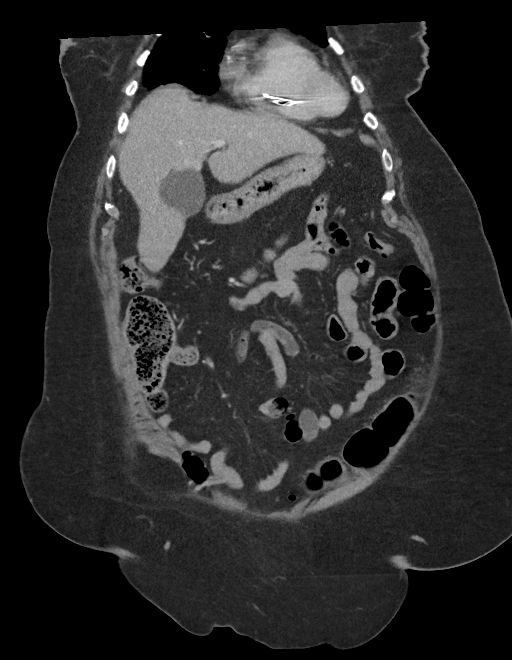
[im 47/106  soft-tissue]
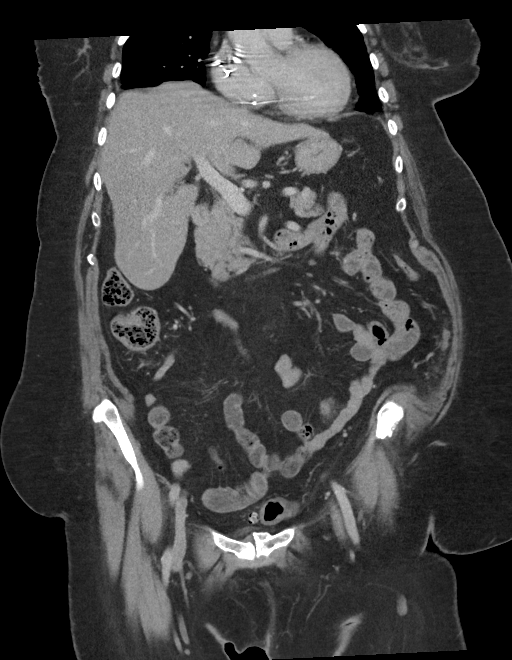
[im 59/106  soft-tissue]
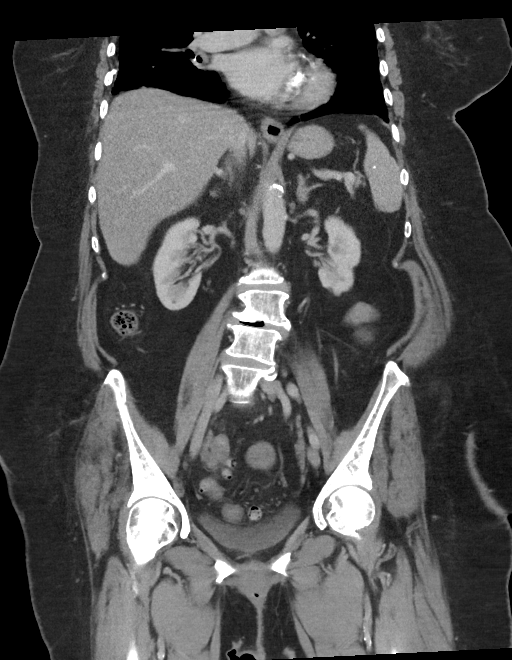

[16 of 46 positions shown; findings below may reference images not displayed]

RADIATION DOSE REDUCTION: This exam was performed according to the
departmental dose-optimization program which includes automated
exposure control, adjustment of the mA and/or kV according to
patient size and/or use of iterative reconstruction technique.

CONTRAST:  100mL OMNIPAQUE IOHEXOL 300 MG/ML  SOLN
FINDINGS: Lower chest: Bibasilar atelectasis/scarring.

Hepatobiliary: No focal liver abnormality is seen. No gallstones,
gallbladder wall thickening, or biliary dilatation.

Pancreas: Unremarkable. No pancreatic ductal dilatation or
surrounding inflammatory changes.

Spleen: Several small calcified granulomas.  Otherwise unremarkable.

Adrenals/Urinary Tract: Adrenals are unremarkable. Kidneys are
unremarkable. Bladder is poorly distended.

Stomach/Bowel: Stomach is within normal limits. Bowel is normal in
caliber. Normal appendix. Distal colonic diverticulosis.

Vascular/Lymphatic: Atherosclerosis.  No enlarged nodes.

Reproductive: Uterus and bilateral adnexa are unremarkable.

Other: No free fluid. Fat and mesenteric vessels within
paraumbilical hernia.

Musculoskeletal: Degenerative changes of the included spine.
IMPRESSION: No acute abnormality.

Colonic diverticulosis.

Fat containing paraumbilical hernia.

## 2022-07-09 DIAGNOSIS — M1712 Unilateral primary osteoarthritis, left knee: Secondary | ICD-10-CM | POA: Diagnosis not present

## 2022-07-26 ENCOUNTER — Other Ambulatory Visit: Payer: Self-pay | Admitting: Internal Medicine

## 2022-07-28 DIAGNOSIS — H93293 Other abnormal auditory perceptions, bilateral: Secondary | ICD-10-CM | POA: Diagnosis not present

## 2022-07-28 DIAGNOSIS — H938X1 Other specified disorders of right ear: Secondary | ICD-10-CM | POA: Diagnosis not present

## 2022-07-28 DIAGNOSIS — H6121 Impacted cerumen, right ear: Secondary | ICD-10-CM | POA: Diagnosis not present

## 2022-07-28 DIAGNOSIS — H903 Sensorineural hearing loss, bilateral: Secondary | ICD-10-CM | POA: Diagnosis not present

## 2022-07-28 NOTE — Progress Notes (Signed)
Remote pacemaker transmission.   

## 2022-08-03 ENCOUNTER — Other Ambulatory Visit: Payer: Self-pay | Admitting: Internal Medicine

## 2022-08-03 DIAGNOSIS — T148XXA Other injury of unspecified body region, initial encounter: Secondary | ICD-10-CM

## 2022-08-03 MED ORDER — TIZANIDINE HCL 4 MG PO TABS: 4.0000 mg | ORAL_TABLET | Freq: Three times a day (TID) | ORAL | 1 refills | Status: DC | PRN

## 2022-08-04 ENCOUNTER — Other Ambulatory Visit: Payer: Self-pay | Admitting: Internal Medicine

## 2022-08-04 DIAGNOSIS — E1143 Type 2 diabetes mellitus with diabetic autonomic (poly)neuropathy: Secondary | ICD-10-CM

## 2022-08-06 ENCOUNTER — Telehealth: Payer: Self-pay | Admitting: Internal Medicine

## 2022-08-06 ENCOUNTER — Ambulatory Visit
Admission: EM | Admit: 2022-08-06 | Discharge: 2022-08-06 | Disposition: A | Discharge status: Home / Self Care | Payer: Medicare PPO | Attending: Physician Assistant | Admitting: Physician Assistant

## 2022-08-06 ENCOUNTER — Encounter: Payer: Self-pay | Admitting: Emergency Medicine

## 2022-08-06 DIAGNOSIS — J441 Chronic obstructive pulmonary disease with (acute) exacerbation: Secondary | ICD-10-CM | POA: Diagnosis not present

## 2022-08-06 MED ORDER — BUDESONIDE-FORMOTEROL FUMARATE 80-4.5 MCG/ACT IN AERO: 2.0000 | INHALATION_SPRAY | Freq: Two times a day (BID) | RESPIRATORY_TRACT | 0 refills | Status: DC

## 2022-08-06 MED ORDER — CEFDINIR 300 MG PO CAPS: 300.0000 mg | ORAL_CAPSULE | Freq: Two times a day (BID) | ORAL | 0 refills | Status: DC

## 2022-08-06 MED ORDER — IPRATROPIUM-ALBUTEROL 0.5-2.5 (3) MG/3ML IN SOLN
3.0000 mL | Freq: Once | RESPIRATORY_TRACT | Status: AC
  Administered 2022-08-06: 3 mL via RESPIRATORY_TRACT

## 2022-08-06 NOTE — ED Provider Notes (Signed)
[facility]    CSN: [id number] Arrival date & time: 08/06/22  1129      History   Chief Complaint No chief complaint on file.   HPI Julie Clay is a 82 y.o. female.   Patient presents today with a 48-hour history of URI symptoms.  She reports productive cough and wheezing.  She also reports chest tightness and a burning sensation with coughing.  She has a history of asthma and has been using her albuterol inhaler with temporary improvement of symptoms.  Her last dose was 7:30 AM this morning.  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  She is up-to-date on her pneumonia vaccines.  She is also had all of her COVID vaccines and last had COVID several years ago.  She does report being treated for a sinus infection approximately 2 months ago with Augmentin but has not had additional antibiotics in the past 90 days.  She has not had any recent steroids.  She does have a history of diabetes but her A1c was well-controlled.  05/08/2022 at 6.5%.  She denies previous hospitalization due to chronic respiratory condition.    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   Gastroesophageal reflux disease 05/20/2021   (HFpEF) heart failure with preserved ejection fraction 05/20/2021   GAD (generalized anxiety disorder) 05/20/2021   Diarrhea 04/23/2021   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    Pacemaker 07/13/2017   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/2017   Primary osteoarthritis of first carpometacarpal joint of left hand 06/12/2015   Fatty liver disease, nonalcoholic 05/23/2015   Lymphedema 02/18/2015   Ganglion cyst of flexor tendon sheath of finger of left hand 08/02/2014   Atypical ductal hyperplasia of left breast 07/12/2013   Endometrial polyp 06/25/2013   Diabetic neuropathy 05/09/2013   Bladder prolapse, female, acquired 05/09/2013   Pain, joint, multiple sites 03/26/2013   Insomnia 11/29/2012   Rotator cuff syndrome  of left shoulder 06/21/2012   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   Essential hypertension, benign 10/11/2011   DM (diabetes mellitus) 10/11/2011   OA (osteoarthritis) 10/11/2011   Class 2 obesity 10/11/2011   Asthma 10/11/2011   Hyperlipidemia 10/11/2011   Chronic fatigue 10/11/2011   OSA (obstructive sleep apnea) 10/11/2011    Past Surgical History:  Procedure Laterality Date   BREAST BIOPSY Left 04/2011   BREAST BIOPSY Left 06/22/13   BREAST BIOPSY Left 09/12/2013   Procedure: BREAST BIOPSY WITH NEEDLE LOCALIZATION;  Surgeon: Dalia Heading, MD;  Location: [facility];  Service: General;   Laterality: Left;   BREAST LUMPECTOMY Left 04/2011 X 2   BREAST SURGERY N/A    Phreesia 01/17/2020   CATARACT EXTRACTION W/ INTRAOCULAR LENS  IMPLANT, BILATERAL Bilateral    CATARACT EXTRACTION, BILATERAL     on different occassions    CESAREAN SECTION  1973   COLON SURGERY N/A    2014   DILATION AND CURETTAGE OF UTERUS  11/06/2013   HAND LIGAMENT RECONSTRUCTION Right ~ 2010   HYSTEROSCOPY WITH D & C N/A 11/06/2013   Procedure: DILATATION AND CURETTAGE /HYSTEROSCOPY;  Surgeon: Tilda Burrow, MD;  Location: [facility];  Service: Gynecology;  Laterality: N/A;   INSERT / REPLACE / REMOVE PACEMAKER  06/22/2017   LUNG LOBECTOMY Right 1988   Fungal Infection   PACEMAKER IMPLANT N/A 06/22/2017   Procedure: PACEMAKER IMPLANT;  Surgeon: Duke Salvia, MD;  Location[facility] IN[facility]ASIVE CV [facility];  Service: Cardiovascular;  Laterality: N/A;   PARTIAL MASTECTOMY WITH NEEDLE LOCALIZATION Right 01/02/2020   Procedure: RIGHT PARTIAL MASTECTOMY AFTER NEEDLE LOCALIZATION;  Surgeon: Franky Macho, MD;  Location: [facility];  Service: General;  Laterality: Right;   POLYPECTOMY N/A 11/06/2013   Procedure: POLYPECTOMY (REMOVAL ENDOMETRIAL POLYP);  Surgeon: Tilda Burrow, MD;  Location: [facility];  Service: Gynecology;  Laterality: N/A;   RECTOCELE REPAIR N/A 11/06/2013   Procedure: POSTERIOR REPAIR (RECTOCELE);  Surgeon: Tilda Burrow, MD;  Location: [facility];  Service: Gynecology;  Laterality: N/A;   SHOULDER ARTHROSCOPY WITH OPEN ROTATOR CUFF REPAIR Left 07/26/2012   TONSILLECTOMY  1949   TUBAL LIGATION      OB History     Gravida  2   Para  2   Term      Preterm      AB      Living  2      SAB      IAB      Ectopic      Multiple      Live Births  2            Home Medications    Prior to Admission medications   Medication Sig Start Date End Date Taking? Authorizing Provider  budesonide-formoterol (SYMBICORT) 80-4.5 MCG/ACT inhaler Inhale 2 puffs into the lungs in the morning and at bedtime.  Rinse your mouth and spit out the water after using this medication to prevent thrush 08/06/22  Yes Latonga Ponder K, PA-C  cefdinir (OMNICEF) 300 MG capsule Take 1 capsule (300 mg total) by mouth 2 (two) times daily. 08/06/22  Yes Bobbi Yount, Noberto Retort, PA-C  acetaminophen (TYLENOL) 500 MG tablet Take 1,000 mg by mouth every 6 (six) hours as needed for moderate pain.    [provider]  albuterol (VENTOLIN HFA) 108 (90 Base) MCG/ACT inhaler Inhale 2 puffs into the lungs every 6 (six) hours as needed for wheezing or shortness of breath.  04/09/20   Salley Scarlet, MD  Aloe Vera Leaf POWD Take 800 mg by mouth 2 (two) times daily.    [provider]  Ascorbic Acid (VITAMIN C) 1000 MG tablet Take 1,000 mg by mouth 2 (two) times daily.    [provider]  b complex vitamins tablet Take 1 tablet by mouth 2 (two) times daily.     [provider]  Blood Glucose Monitoring Suppl (BLOOD GLUCOSE SYSTEM PAK) KIT Use as directed to monitor FSBS 1x daily. Dx: E11.9. Please dispense as Accu-Chek Aviva 01/18/18   Basye, Velna Hatchet, MD  Calcium Polycarbophil (FIBER-CAPS PO) Take by mouth daily at 6 (six) AM. Two po QHS    [provider]  Cholecalciferol (VITAMIN D3) 2000 UNITS TABS Take 2,000 Units by mouth 2 (two) times daily.    [provider]  clotrimazole-betamethasone (LOTRISONE) cream Apply 1 Application topically daily. 06/25/22   Anabel Halon, MD  dapagliflozin propanediol (FARXIGA) 10 MG TABS tablet Take 1 tablet (10 mg total) by mouth daily before breakfast. 06/18/22   Anabel Halon, MD  diclofenac Sodium (VOLTAREN) 1 % GEL Apply 2 g topically 4 (four) times daily. Patient taking differently: Apply 2 g topically 4 (four) times daily. prn 11/19/20   Heather Roberts, NP  fluocinonide cream (LIDEX) 0.05 % Apply 1 Application topically 2 (two) times daily as needed (poison ivy/bites/hives). 06/25/22   Anabel Halon, MD  Ginkgo Biloba 120 MG CAPS Take 120 mg by mouth  in the morning and at bedtime.     [provider]  glipiZIDE (GLUCOTROL XL) 5 MG 24 hr tablet Take 1 tablet by mouth once daily with breakfast 08/04/22   Anabel Halon, MD  Glucose Blood (BLOOD GLUCOSE TEST STRIPS) STRP Use as directed to monitor FSBS 1x daily. Dx: E11.9. Please dispense as Accu-Chek Aviva 01/18/18   Register, Velna Hatchet, MD  Krill Oil 1000 MG CAPS Take 1,000 mg by mouth 2 (two) times daily.     [provider]  Lancets MISC Use as directed to monitor FSBS 1x daily. Dx: E11.9. Please dispense as Accu-Chek Aviva 01/18/18   Lolo, Velna Hatchet, MD  loperamide (IMODIUM A-D) 2 MG tablet Take 1 tablet (2 mg total) by mouth 4 (four) times daily as needed for diarrhea or loose stools. 04/23/21   Paseda, Baird Kay, FNP  LORazepam (ATIVAN) 0.5 MG tablet Take 1 tablet (0.5 mg total) by mouth 2 (two) times daily as needed for anxiety. Patient not taking: Reported on 05/18/2022 05/20/21   Anabel Halon, MD  metoprolol succinate (TOPROL-XL) 25 MG 24 hr tablet Take 1/2 (one-half) tablet by mouth once daily 06/23/22   [place] Ouch, [place] M, PA-C  Multiple Vitamin (MULTIVITAMIN WITH MINERALS) TABS tablet Take 1 tablet by mouth daily.    [provider]  naftifine (NAFTIN) 1 % cream Apply topically 2 (two) times daily as needed. 12/04/19   Ogilvie, Velna Hatchet, MD  rosuvastatin (CRESTOR) 5 MG tablet TAKE 1 TABLET BY MOUTH ONCE DAILY FOR CHOLESTEROL 07/27/22   Anabel Halon, MD  Semaglutide, 1 MG/DOSE, 4 MG/3ML SOPN Inject 1 mg as directed once a week. 06/21/22   Anabel Halon, MD  simethicone (MYLICON) 80 MG chewable tablet Chew 80 mg by mouth as needed for flatulence.     [provider]  tiZANidine (ZANAFLEX) 4 MG tablet Take 1 tablet (4 mg total) by mouth every 8 (eight) hours as needed for muscle spasms. 08/03/22  Anabel Halon, MD  torsemide (DEMADEX) 20 MG tablet Take 1 tablet (20 mg total) by mouth 3 (three) times a week. Patient taking differently: Take 10 mg by mouth  every other day. 07/31/21   Strader, Lennart Pall, PA-C  UNABLE TO FIND Please dispense custom molded foot orthotics and heel spurs Dx: E11.30 09/30/20   Heather Roberts, NP    Family History Family History  Problem Relation Age of Onset   Hypertension Mother    Heart disease Mother    Diabetes Mother    Kidney disease Mother        ESRD   Cancer Mother        Bladder   Hypertension Father    Hyperlipidemia Father    Heart disease Father    Diabetes Father    Cancer Father        Stomach Cancer   Diabetes Sister    Heart disease Sister    Alzheimer's disease Paternal Aunt    Stroke Maternal Grandfather    Cancer Paternal Grandfather        stomach   Cancer Maternal Uncle        prostate   Cancer Paternal Uncle        stomach    Social History Social History   Tobacco Use   Smoking status: Never   Smokeless tobacco: Never  Vaping Use   Vaping Use: Never used  Substance Use Topics   Alcohol use: Not Currently    Comment: Twice a year   Drug use: No     Allergies   Arimidex [anastrozole], Other, Adhesive [tape], Lisinopril, Tetanus toxoids, and Tetracyclines & related   Review of Systems Review of Systems  Constitutional:  Positive for activity change. Negative for appetite change, fatigue and fever.  HENT:  Positive for congestion. Negative for sinus pressure, sneezing and sore throat.   Respiratory:  Positive for cough, chest tightness and shortness of breath. Negative for wheezing.   Cardiovascular:  Negative for chest pain.  Gastrointestinal:  Negative for abdominal pain, diarrhea, nausea and vomiting.     Physical Exam Triage Vital Signs ED Triage Vitals  Enc Vitals Group     BP 08/06/22 1131 (!) 143/70     Pulse Rate 08/06/22 1131 71     Resp 08/06/22 1131 20     Temp 08/06/22 1131 98.2 F (36.8 C)     Temp Source 08/06/22 1131 Oral     SpO2 08/06/22 1131 96 %     Weight --      Height --      Head Circumference --      Peak Flow --      Pain  Score 08/06/22 1134 3     Pain Loc --      Pain Edu? --      Excl. in GC? --    No data found.  Updated Vital Signs BP (!) 143/70 (BP Location: Right Arm)   Pulse 70   Temp 98.2 F (36.8 C) (Oral)   Resp 20   SpO2 99%   Visual Acuity Right Eye Distance:   Left Eye Distance:   Bilateral Distance:    Right Eye Near:   Left Eye Near:    Bilateral Near:     Physical Exam Vitals reviewed.  Constitutional:      General: She is awake. She is not in acute distress.    Appearance: Normal appearance. She is well-developed. She is not ill-appearing.  Comments: Very pleasant female appears stated age in no acute distress sitting comfortably in exam room  HENT:     Head: Normocephalic and atraumatic.     Right Ear: Tympanic membrane, ear canal and external ear normal. Tympanic membrane is not erythematous or bulging.     Left Ear: Tympanic membrane, ear canal and external ear normal. Tympanic membrane is not erythematous or bulging.     Nose:     Right Sinus: No maxillary sinus tenderness or frontal sinus tenderness.     Left Sinus: No maxillary sinus tenderness or frontal sinus tenderness.     Mouth/Throat:     Pharynx: Uvula midline. No oropharyngeal exudate or posterior oropharyngeal erythema.  Cardiovascular:     Rate and Rhythm: Normal rate and regular rhythm.     Heart sounds: Normal heart sounds, S1 normal and S2 normal. No murmur heard. Pulmonary:     Effort: Pulmonary effort is normal.     Breath sounds: Wheezing present. No rhonchi or rales.     Comments: Scattered wheezing Psychiatric:        Behavior: Behavior is cooperative.      UC Treatments / Results  Labs (all labs ordered are listed, but only abnormal results are displayed) Labs Reviewed - No data to display  EKG   Radiology No results found.  Procedures Procedures (including critical care time)  Medications Ordered in UC Medications  ipratropium-albuterol (DUONEB) 0.5-2.5 (3) MG/3ML  nebulizer solution 3 mL (3 mLs Nebulization Given 08/06/22 1155)    Initial Impression / Assessment and Plan / UC Course  I have reviewed the triage vital signs and the nursing notes.  Pertinent labs & imaging results that were available during my care of the patient were reviewed by me and considered in my medical decision making (see chart for details).     Patient is well-appearing, afebrile, nontoxic, nontachycardic.  She did have widespread wheezing that resolved with in office DuoNeb.  We discussed that symptoms could be viral in nature, however, patient is concern for bacterial infection as she often gets COPD exacerbations requiring antibiotics with similar presentation.  Given her increased cough and sputum production will cover for secondary bacterial infection with Omnicef twice daily given recent Augmentin use.  Will also start Symbicort to help manage her symptoms including bronchial inflammation.  Inhaled corticosteroids were chosen over systemic corticosteroids given her history of diabetes.  We discussed that if her symptoms persist or worsen she would need to return as we can consider medication adjustment including systemic steroids and LAMA use.  She can use over-the-counter medication including Mucinex and Flonase.  She is to rest and drink plenty of fluids.  We discussed that she is to rinse her mouth following use of Symbicort in order to prevent thrush.  If she has any worsening or changing symptoms including increasing cough, fever, nausea/vomiting interfering with oral intake, weakness she needs to be seen immediately.  Strict return precautions given.  All questions answered to patient satisfaction.  Final Clinical Impressions(s) / UC Diagnoses   Final diagnoses:  COPD exacerbation     Discharge Instructions      As we discussed, it is possible that you have a viral illness.  Because of your symptoms and history of COPD we are going to cover with an antibiotic.  Take  cefdinir twice daily.  I would also like you to start Symbicort twice daily.  Make sure to rinse her mouth following use of this medication in order to  prevent thrush.  Continue using your albuterol every 4-6 hours as needed for shortness of breath and coughing fits.  Use over-the-counter medication including Mucinex, Flonase, Tylenol for additional symptom relief.  Follow-up with your primary care provider next week.  If anything worsens or changes please be seen immediately.     ED Prescriptions     Medication Sig Dispense Auth. Provider   budesonide-formoterol (SYMBICORT) 80-4.5 MCG/ACT inhaler Inhale 2 puffs into the lungs in the morning and at bedtime. Rinse your mouth and spit out the water after using this medication to prevent thrush 1 each Kaymen Adrian K, PA-C   cefdinir (OMNICEF) 300 MG capsule Take 1 capsule (300 mg total) by mouth 2 (two) times daily. 14 capsule Temeca Somma K, PA-C      PDMP not reviewed this encounter.   [name], PA-C 08/06/22 1223

## 2022-08-06 NOTE — Discharge Instructions (Signed)
As we discussed, it is possible that you have a viral illness.  Because of your symptoms and history of COPD we are going to cover with an antibiotic.  Take cefdinir twice daily.  I would also like you to start Symbicort twice daily.  Make sure to rinse her mouth following use of this medication in order to prevent thrush.  Continue using your albuterol every 4-6 hours as needed for shortness of breath and coughing fits.  Use over-the-counter medication including Mucinex, Flonase, Tylenol for additional symptom relief.  Follow-up with your primary care provider next week.  If anything worsens or changes please be seen immediately.

## 2022-08-06 NOTE — ED Triage Notes (Signed)
Burning in chest with cough and wheezing since Wednesday. Cough productive at times with yellow sputum.  Has been taking cough medication and albuterol inhaler with some relief

## 2022-08-06 NOTE — Telephone Encounter (Signed)
Pt brought husband in earlier in the week. She is now sick, and would like a Zpack sent to pharmacy. If I need to schedule her an appt I will, She just didn't want to come in office.

## 2022-08-09 ENCOUNTER — Ambulatory Visit: Payer: Medicare PPO | Admitting: Family Medicine

## 2022-08-19 ENCOUNTER — Encounter: Payer: Self-pay | Admitting: Internal Medicine

## 2022-08-19 ENCOUNTER — Ambulatory Visit (INDEPENDENT_AMBULATORY_CARE_PROVIDER_SITE_OTHER): Payer: Medicare PPO | Admitting: Internal Medicine

## 2022-08-19 VITALS — BP 136/80 | HR 74 | Ht 65.0 in | Wt 222.6 lb

## 2022-08-19 DIAGNOSIS — I10 Essential (primary) hypertension: Secondary | ICD-10-CM | POA: Diagnosis not present

## 2022-08-19 DIAGNOSIS — J0110 Acute frontal sinusitis, unspecified: Secondary | ICD-10-CM | POA: Diagnosis not present

## 2022-08-19 DIAGNOSIS — J4531 Mild persistent asthma with (acute) exacerbation: Secondary | ICD-10-CM | POA: Diagnosis not present

## 2022-08-19 MED ORDER — BUDESONIDE-FORMOTEROL FUMARATE 80-4.5 MCG/ACT IN AERO: 2.0000 | INHALATION_SPRAY | Freq: Two times a day (BID) | RESPIRATORY_TRACT | 3 refills | Status: DC

## 2022-08-19 MED ORDER — METHYLPREDNISOLONE 4 MG PO TBPK: ORAL_TABLET | ORAL | 0 refills | Status: DC

## 2022-08-19 NOTE — Assessment & Plan Note (Signed)
Recently completed cefdinir for acute sinusitis Advised to use humidifier and/or vaporizer for nasal congestion Avoid using phenylephrine due to HTN Nasal saline spray as needed

## 2022-08-19 NOTE — Patient Instructions (Signed)
Please start taking Prednisone as prescribed.  Please continue using Symbicort and use Albuterol as needed for shortness of breath or wheezing.

## 2022-08-19 NOTE — Progress Notes (Signed)
Acute Office Visit  Subjective:    Patient ID: Julie Clay, female    DOB: 23-May-1940, 82 y.o.   MRN: [id number]  Chief Complaint  Patient presents with   Cough    Patient is still coughing up yellow mucus.     HPI Patient is in today for complaint of cough with yellowish expectoration for the last 2 weeks.  She went to urgent care on 04/19 and was given cefdinir and Symbicort for COPD?  Exacerbation.  She was also given DuoNeb in the urgent care, which helped her with dyspnea.  Of note, she had nasal congestion, postnasal drip and sore throat at that time, which have improved now.  She has cough and wheezing currently.  She has history of asthma, but denies any smoking history.  She has albuterol inhaler for as needed use.  Denies any fever or chills recently.  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    Osteoarthritis    both knees, lower back, both shoulders; bone spurs to feet   Rectocele 06/25/2013   Posterior repair 11/06/13    Type II diabetes mellitus (HCC)    Ventral hernia     Past Surgical History:  Procedure Laterality Date   BREAST BIOPSY Left 04/2011   BREAST BIOPSY Left 06/22/13   BREAST BIOPSY Left 09/12/2013   Procedure: BREAST BIOPSY WITH NEEDLE LOCALIZATION;  Surgeon: Dalia Heading, MD;  Location: [facility];  Service: General;  Laterality: Left;   BREAST LUMPECTOMY  Left 04/2011 X 2   BREAST SURGERY N/A    Phreesia 01/17/2020   CATARACT EXTRACTION W/ INTRAOCULAR LENS  IMPLANT, BILATERAL Bilateral    CATARACT EXTRACTION, BILATERAL     on different occassions    CESAREAN SECTION  1973   COLON SURGERY N/A    2014   DILATION AND CURETTAGE OF UTERUS  11/06/2013   HAND LIGAMENT RECONSTRUCTION Right ~ 2010   HYSTEROSCOPY WITH D & C N/A 11/06/2013   Procedure: DILATATION AND CURETTAGE /HYSTEROSCOPY;  Surgeon: Tilda Burrow, MD;  Location: [facility];  Service: Gynecology;  Laterality: N/A;   INSERT / REPLACE / REMOVE PACEMAKER  06/22/2017   LUNG LOBECTOMY Right 1988   Fungal Infection   PACEMAKER IMPLANT N/A 06/22/2017   Procedure: PACEMAKER IMPLANT;  Surgeon: Duke Salvia, MD;  Location[facility] INVASIVE CV LAB;  Service: Cardiovascular;  Laterality: N/A;   PARTIAL MASTECTOMY WITH NEEDLE LOCALIZATION Right 01/02/2020   Procedure: RIGHT PARTIAL MASTECTOMY AFTER NEEDLE LOCALIZATION;  Surgeon: Franky Macho, MD;  Location: [facility];  Service: General;  Laterality: Right;   POLYPECTOMY N/A 11/06/2013   Procedure: POLYPECTOMY (REMOVAL ENDOMETRIAL POLYP);  Surgeon: Tilda Burrow, MD;  Location: [facility];  Service: Gynecology;  Laterality: N/A;   RECTOCELE REPAIR N/A 11/06/2013   Procedure: POSTERIOR REPAIR (RECTOCELE);  Surgeon: Tilda Burrow, MD;  Location: [facility];  Service: Gynecology;  Laterality: N/A;   SHOULDER ARTHROSCOPY WITH OPEN ROTATOR CUFF REPAIR Left 07/26/2012   TONSILLECTOMY  1949   TUBAL LIGATION      Family History  Problem Relation Age of Onset   Hypertension Mother    Heart disease Mother    Diabetes Mother    Kidney disease Mother        ESRD   Cancer Mother        Bladder   Hypertension Father    Hyperlipidemia Father    Heart disease Father    Diabetes Father    Cancer Father        Stomach Cancer   Diabetes Sister    Heart disease Sister    Alzheimer's disease Paternal Aunt    Stroke Maternal Grandfather    Cancer Paternal Grandfather         stomach   Cancer Maternal Uncle        prostate   Cancer Paternal Uncle        stomach    Social History   Socioeconomic History   Marital status: Married    Spouse name: Kandee Keen   Number of children: 2   Years of education: 12   Highest education level: Some college, no degree  Occupational History   Occupation: retired     Comment: Advice worker for [employer]  Tobacco Use   Smoking status: Never   Smokeless tobacco: Never  Vaping Use   Vaping Use: Never used  Substance and Sexual Activity   Alcohol use: Not Currently    Comment: Twice a year   Drug use: No   Sexual activity: Not Currently    Birth control/protection: Surgical  Other Topics Concern   Not on file  Social History Narrative   Not on file   Social Determinants of Health   Financial Resource Strain: Low Risk  (04/26/2022)   Overall Financial Resource Strain (CARDIA)    Difficulty of Paying Living Expenses: Not very hard  Food Insecurity: No Food Insecurity (04/26/2022)   Hunger Vital Sign    Worried About Running Out of Food in the Last Year: Never true    Ran Out of Food in the Last Year: Never true  Transportation Needs: No Transportation Needs (04/26/2022)   PRAPARE - Administrator, Civil Service (Medical): No    Lack of Transportation (Non-Medical): No  Physical Activity: Inactive (04/26/2022)   Exercise Vital Sign    Days of Exercise per Week: 0 days    Minutes of Exercise per Session: 0 min  Stress: No Stress Concern Present (04/26/2022)   [employer] of Occupational Health - Occupational Stress Questionnaire    Feeling of Stress : Only a little  Social Connections: Moderately Integrated (04/26/2022)   Social Connection and Isolation Panel [NHANES]    Frequency of Communication with Friends and Family: Once a week    Frequency of Social Gatherings with Friends and Family: Once a week    Attends Religious Services: More than 4 times per year    Active Member of [employer] or  Organizations: Yes    Attends Banker Meetings: More than 4 times per year    Marital Status: Married  Catering manager Violence: Not At Risk (04/26/2022)   Humiliation, Afraid, Rape, and Kick questionnaire  Fear of Current or Ex-Partner: No    Emotionally Abused: No    Physically Abused: No    Sexually Abused: No    Outpatient Medications Prior to Visit  Medication Sig Dispense Refill   acetaminophen (TYLENOL) 500 MG tablet Take 1,000 mg by mouth every 6 (six) hours as needed for moderate pain.     albuterol (VENTOLIN HFA) 108 (90 Base) MCG/ACT inhaler Inhale 2 puffs into the lungs every 6 (six) hours as needed for wheezing or shortness of breath. 8 g 0   Aloe Vera Leaf POWD Take 800 mg by mouth 2 (two) times daily.     Ascorbic Acid (VITAMIN C) 1000 MG tablet Take 1,000 mg by mouth 2 (two) times daily.     b complex vitamins tablet Take 1 tablet by mouth 2 (two) times daily.      Blood Glucose Monitoring Suppl (BLOOD GLUCOSE SYSTEM PAK) KIT Use as directed to monitor FSBS 1x daily. Dx: E11.9. Please dispense as Accu-Chek Aviva 1 each 1   Calcium Polycarbophil (FIBER-CAPS PO) Take by mouth daily at 6 (six) AM. Two po QHS     Cholecalciferol (VITAMIN D3) 2000 UNITS TABS Take 2,000 Units by mouth 2 (two) times daily.     clotrimazole-betamethasone (LOTRISONE) cream Apply 1 Application topically daily. 30 g 0   dapagliflozin propanediol (FARXIGA) 10 MG TABS tablet Take 1 tablet (10 mg total) by mouth daily before breakfast. 90 tablet 3   diclofenac Sodium (VOLTAREN) 1 % GEL Apply 2 g topically 4 (four) times daily. (Patient taking differently: Apply 2 g topically 4 (four) times daily. prn) 100 g 1   fluocinonide cream (LIDEX) 0.05 % Apply 1 Application topically 2 (two) times daily as needed (poison ivy/bites/hives). 30 g 11   Ginkgo Biloba 120 MG CAPS Take 120 mg by mouth in the morning and at bedtime.      glipiZIDE (GLUCOTROL XL) 5 MG 24 hr tablet Take 1 tablet by mouth once  daily with breakfast 90 tablet 0   Glucose Blood (BLOOD GLUCOSE TEST STRIPS) STRP Use as directed to monitor FSBS 1x daily. Dx: E11.9. Please dispense as Accu-Chek Aviva 100 each 1   Krill Oil 1000 MG CAPS Take 1,000 mg by mouth 2 (two) times daily.      Lancets MISC Use as directed to monitor FSBS 1x daily. Dx: E11.9. Please dispense as Accu-Chek Aviva 100 each 1   loperamide (IMODIUM A-D) 2 MG tablet Take 1 tablet (2 mg total) by mouth 4 (four) times daily as needed for diarrhea or loose stools. 30 tablet 1   LORazepam (ATIVAN) 0.5 MG tablet Take 1 tablet (0.5 mg total) by mouth 2 (two) times daily as needed for anxiety. (Patient not taking: Reported on 05/18/2022) 15 tablet 0   metoprolol succinate (TOPROL-XL) 25 MG 24 hr tablet Take 1/2 (one-half) tablet by mouth once daily 45 tablet 2   Multiple Vitamin (MULTIVITAMIN WITH MINERALS) TABS tablet Take 1 tablet by mouth daily.     naftifine (NAFTIN) 1 % cream Apply topically 2 (two) times daily as needed. 60 g 2   rosuvastatin (CRESTOR) 5 MG tablet TAKE 1 TABLET BY MOUTH ONCE DAILY FOR CHOLESTEROL 90 tablet 0   Semaglutide, 1 MG/DOSE, 4 MG/3ML SOPN Inject 1 mg as directed once a week. 9 mL 1   simethicone (MYLICON) 80 MG chewable tablet Chew 80 mg by mouth as needed for flatulence.      tiZANidine (ZANAFLEX) 4 MG tablet Take 1 tablet (  4 mg total) by mouth every 8 (eight) hours as needed for muscle spasms. 90 tablet 1   torsemide (DEMADEX) 20 MG tablet Take 1 tablet (20 mg total) by mouth 3 (three) times a week. (Patient taking differently: Take 10 mg by mouth every other day.) 40 tablet 3   UNABLE TO FIND Please dispense custom molded foot orthotics and heel spurs Dx: E11.30 1 each 0   budesonide-formoterol (SYMBICORT) 80-4.5 MCG/ACT inhaler Inhale 2 puffs into the lungs in the morning and at bedtime. Rinse your mouth and spit out the water after using this medication to prevent thrush 1 each 0   cefdinir (OMNICEF) 300 MG capsule Take 1 capsule (300  mg total) by mouth 2 (two) times daily. 14 capsule 0   No facility-administered medications prior to visit.    Allergies  Allergen Reactions   Arimidex [Anastrozole] Other (See Comments)    Arthralgias and myalgias.  Improved with 3 week hiatus from drug.  Categorical side effect of drug class.   Other Hives and Itching   Adhesive [Tape] Other (See Comments)    "red and blistered"   Lisinopril     Dry hacking cough and tickle   Tetanus Toxoids    Tetracyclines & Related Other (See Comments)    unknown    Review of Systems  Constitutional:  Negative for chills and fever.  HENT:  Negative for congestion, sinus pressure, sinus pain and sore throat.   Eyes:  Negative for pain and discharge.  Respiratory:  Positive for cough, shortness of breath and wheezing.   Cardiovascular:  Negative for chest pain and palpitations.  Gastrointestinal:  Positive for diarrhea. Negative for abdominal pain, nausea and vomiting.  Endocrine: Negative for polydipsia and polyuria.  Genitourinary:  Negative for dysuria and hematuria.  Musculoskeletal:  Negative for neck pain and neck stiffness.  Skin:  Positive for color change (Left face area mole). Negative for rash.  Neurological:  Negative for dizziness and weakness.  Psychiatric/Behavioral:  Negative for agitation and behavioral problems. The patient is nervous/anxious.        Objective:    Physical Exam Vitals reviewed.  Constitutional:      General: She is not in acute distress.    Appearance: She is obese. She is not diaphoretic.  HENT:     Head: Normocephalic and atraumatic.     Nose: No congestion.     Right Sinus: Frontal sinus tenderness present.     Left Sinus: Frontal sinus tenderness present.     Mouth/Throat:     Mouth: Mucous membranes are moist.     Pharynx: No posterior oropharyngeal erythema.  Eyes:     General: No scleral icterus.    Extraocular Movements: Extraocular movements intact.  Cardiovascular:     Rate and  Rhythm: Normal rate and regular rhythm.     Pulses: Normal pulses.     Heart sounds: Normal heart sounds. No murmur heard. Pulmonary:     Breath sounds: Wheezing (Mild, upper lung fields) present. No rales.  Abdominal:     Palpations: Abdomen is soft.     Tenderness: There is no abdominal tenderness.  Musculoskeletal:     Cervical back: Neck supple. No tenderness.     Right lower leg: No edema.     Left lower leg: No edema.  Skin:    General: Skin is warm.     Findings: Lesion (Brownish papule over left side of face and left upper arm - likely actinic keratoses) present. No  rash.  Neurological:     General: No focal deficit present.     Mental Status: She is alert and oriented to person, place, and time.     Cranial Nerves: No cranial nerve deficit.     Sensory: No sensory deficit.     Motor: No weakness.  Psychiatric:        Mood and Affect: Mood normal.        Behavior: Behavior normal.     BP 136/80 (BP Location: Right Arm)   Pulse 74   Ht 5\' 5"  (1.651 m)   Wt 222 lb 9.6 oz (101 kg)   SpO2 93%   BMI 37.04 kg/m  Wt Readings from Last 3 Encounters:  08/19/22 222 lb 9.6 oz (101 kg)  06/25/22 200 lb 12.8 oz (91.1 kg)  05/18/22 221 lb 12.8 oz (100.6 kg)        Assessment & Plan:   Problem List Items Addressed This Visit       Cardiovascular and Mediastinum   Essential hypertension, benign    BP Readings from Last 1 Encounters:  08/19/22 136/80  Well-controlled with Metoprolol (for CHF) Counseled for compliance with the medications Advised DASH diet and moderate exercise/walking as tolerated        Respiratory   Asthma with acute exacerbation - Primary    Likely has asthma exacerbation from recent acute sinusitis - Urgent care chart reviewed Persistent cough and dyspnea with wheezing Was recently placed on Symbicort, refilled Continue albuterol as needed for dyspnea or wheezing Started Medrol Dosepak Recently completed cefdinir for acute sinusitis       Relevant Medications   methylPREDNISolone (MEDROL DOSEPAK) 4 MG TBPK tablet   budesonide-formoterol (SYMBICORT) 80-4.5 MCG/ACT inhaler   Acute non-recurrent frontal sinusitis    Recently completed cefdinir for acute sinusitis Advised to use humidifier and/or vaporizer for nasal congestion Avoid using phenylephrine due to HTN Nasal saline spray as needed      Relevant Medications   methylPREDNISolone (MEDROL DOSEPAK) 4 MG TBPK tablet     Meds ordered this encounter  Medications   methylPREDNISolone (MEDROL DOSEPAK) 4 MG TBPK tablet    Sig: Take as package instructions.    Dispense:  1 each    Refill:  0   budesonide-formoterol (SYMBICORT) 80-4.5 MCG/ACT inhaler    Sig: Inhale 2 puffs into the lungs in the morning and at bedtime. Rinse your mouth and spit out the water after using this medication to prevent thrush    Dispense:  1 each    Refill:  3     Granger Chui Concha Se, MD

## 2022-08-19 NOTE — Assessment & Plan Note (Signed)
BP Readings from Last 1 Encounters:  08/19/22 136/80   Well-controlled with Metoprolol (for CHF) Counseled for compliance with the medications Advised DASH diet and moderate exercise/walking as tolerated

## 2022-08-19 NOTE — Assessment & Plan Note (Addendum)
Likely has asthma exacerbation from recent acute sinusitis - Urgent care chart reviewed Persistent cough and dyspnea with wheezing Was recently placed on Symbicort, refilled Continue albuterol as needed for dyspnea or wheezing Started Medrol Dosepak Recently completed cefdinir for acute sinusitis

## 2022-09-01 ENCOUNTER — Emergency Department (HOSPITAL_COMMUNITY)
Admission: EM | Admit: 2022-09-01 | Discharge: 2022-09-01 | Disposition: A | Discharge status: Home / Self Care | Payer: Medicare PPO | Attending: Emergency Medicine | Admitting: Emergency Medicine

## 2022-09-01 ENCOUNTER — Emergency Department (HOSPITAL_COMMUNITY): Payer: Medicare PPO

## 2022-09-01 ENCOUNTER — Encounter (HOSPITAL_COMMUNITY): Payer: Self-pay | Admitting: *Deleted

## 2022-09-01 ENCOUNTER — Other Ambulatory Visit: Payer: Self-pay

## 2022-09-01 DIAGNOSIS — R1032 Left lower quadrant pain: Secondary | ICD-10-CM | POA: Diagnosis not present

## 2022-09-01 DIAGNOSIS — Z794 Long term (current) use of insulin: Secondary | ICD-10-CM | POA: Insufficient documentation

## 2022-09-01 DIAGNOSIS — K921 Melena: Secondary | ICD-10-CM

## 2022-09-01 DIAGNOSIS — K559 Vascular disorder of intestine, unspecified: Secondary | ICD-10-CM | POA: Insufficient documentation

## 2022-09-01 DIAGNOSIS — Z950 Presence of cardiac pacemaker: Secondary | ICD-10-CM | POA: Insufficient documentation

## 2022-09-01 DIAGNOSIS — K625 Hemorrhage of anus and rectum: Secondary | ICD-10-CM | POA: Diagnosis not present

## 2022-09-01 DIAGNOSIS — R109 Unspecified abdominal pain: Secondary | ICD-10-CM | POA: Diagnosis present

## 2022-09-01 DIAGNOSIS — Z7984 Long term (current) use of oral hypoglycemic drugs: Secondary | ICD-10-CM | POA: Insufficient documentation

## 2022-09-01 DIAGNOSIS — E119 Type 2 diabetes mellitus without complications: Secondary | ICD-10-CM | POA: Insufficient documentation

## 2022-09-01 DIAGNOSIS — K529 Noninfective gastroenteritis and colitis, unspecified: Secondary | ICD-10-CM | POA: Diagnosis not present

## 2022-09-01 DIAGNOSIS — R1031 Right lower quadrant pain: Secondary | ICD-10-CM | POA: Diagnosis not present

## 2022-09-01 DIAGNOSIS — I700 Atherosclerosis of aorta: Secondary | ICD-10-CM | POA: Diagnosis not present

## 2022-09-01 HISTORY — DX: Irritable bowel syndrome, unspecified: K58.9

## 2022-09-01 LAB — CBC WITH DIFFERENTIAL/PLATELET
Abs Immature Granulocytes: 0.07 10*3/uL (ref 0.00–0.07)
Basophils Absolute: 0 10*3/uL (ref 0.0–0.1)
Basophils Relative: 0 %
Eosinophils Absolute: 0.3 10*3/uL (ref 0.0–0.5)
Eosinophils Relative: 2 %
HCT: 44.4 % (ref 36.0–46.0)
Hemoglobin: 14.5 g/dL (ref 12.0–15.0)
Immature Granulocytes: 1 %
Lymphocytes Relative: 14 %
Lymphs Abs: 1.7 10*3/uL (ref 0.7–4.0)
MCH: 28.1 pg (ref 26.0–34.0)
MCHC: 32.7 g/dL (ref 30.0–36.0)
MCV: 86 fL (ref 80.0–100.0)
Monocytes Absolute: 1.1 10*3/uL — ABNORMAL HIGH (ref 0.1–1.0)
Monocytes Relative: 9 %
Neutro Abs: 8.9 10*3/uL — ABNORMAL HIGH (ref 1.7–7.7)
Neutrophils Relative %: 74 %
Platelets: 229 10*3/uL (ref 150–400)
RBC: 5.16 MIL/uL — ABNORMAL HIGH (ref 3.87–5.11)
RDW: 13.6 % (ref 11.5–15.5)
WBC: 12.1 10*3/uL — ABNORMAL HIGH (ref 4.0–10.5)
nRBC: 0 % (ref 0.0–0.2)

## 2022-09-01 LAB — CBC
HCT: 42.9 % (ref 36.0–46.0)
Hemoglobin: 14.1 g/dL (ref 12.0–15.0)
MCH: 28.4 pg (ref 26.0–34.0)
MCHC: 32.9 g/dL (ref 30.0–36.0)
MCV: 86.5 fL (ref 80.0–100.0)
Platelets: 203 10*3/uL (ref 150–400)
RBC: 4.96 MIL/uL (ref 3.87–5.11)
RDW: 13.8 % (ref 11.5–15.5)
WBC: 10.4 10*3/uL (ref 4.0–10.5)
nRBC: 0 % (ref 0.0–0.2)

## 2022-09-01 LAB — BASIC METABOLIC PANEL
Anion gap: 9 (ref 5–15)
BUN: 17 mg/dL (ref 8–23)
CO2: 24 mmol/L (ref 22–32)
Calcium: 9.2 mg/dL (ref 8.9–10.3)
Chloride: 106 mmol/L (ref 98–111)
Creatinine, Ser: 0.88 mg/dL (ref 0.44–1.00)
GFR, Estimated: >60 mL/min (ref >60)
Glucose, Bld: 126 mg/dL — ABNORMAL HIGH (ref 70–99)
Potassium: 3.8 mmol/L (ref 3.5–5.1)
Sodium: 139 mmol/L (ref 135–145)

## 2022-09-01 MED ORDER — IOHEXOL 300 MG/ML  SOLN
100.0000 mL | Freq: Once | INTRAMUSCULAR | Status: AC | PRN
  Administered 2022-09-01: 100 mL via INTRAVENOUS

## 2022-09-01 NOTE — Discharge Instructions (Signed)
You were seen for your bloody stool in the emergency department.  Is likely from a condition called ischemic colitis.  This will likely improve over the next few days.  Follow-up with your primary doctor in 2-3 days regarding your visit.  Follow-up with your GI doctor in 1 week.  Return immediately to the emergency department if you experience any of the following: Severe abdominal pain, fevers, shortness of breath, dizziness, or any other concerning symptoms.    Thank you for visiting our Emergency Department. It was a pleasure taking care of you today.

## 2022-09-01 NOTE — ED Provider Notes (Signed)
[place] EMERGENCY DEPARTMENT AT [facility] Provider Note   CSN: [id number] Arrival date & time: 09/01/22  1039     History  Chief Complaint  Patient presents with   GI Bleeding    Julie Clay is a 82 y.o. female.  HPI    Patient comes in with chief complaint of hematochezia.  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  Patient states that since late last night, she has been having episodes of bloody stool.  She thinks she has had about 5 episodes of bloody stools, there frankly bloody, small amount, bright red, no clots.  Patient will have intermittent abdominal cramping in the lower abdominal region.  Currently she is pain-free.  She denies any recent travel history, no sick contacts.  Patient had a colonoscopy several years ago.  She does have history of diverticulosis per records.  No history of GI bleed.  Patient is not on any blood thinners.  Home Medications Prior to Admission medications   Medication Sig Start Date End Date Taking? Authorizing Provider  acetaminophen (TYLENOL) 500 MG tablet Take 1,000 mg by mouth every 6 (six) hours as needed for moderate pain.    [provider]  albuterol (VENTOLIN HFA) 108 (90 Base) MCG/ACT inhaler Inhale 2 puffs into the lungs every 6 (six) hours as needed for wheezing or shortness of breath. 04/09/20   Salley Scarlet, MD  Aloe Vera Leaf POWD Take 800 mg by mouth 2 (two) times daily.    [provider]  Ascorbic Acid (VITAMIN C) 1000 MG tablet Take 1,000 mg by mouth 2 (two) times daily.    [provider]  b complex vitamins tablet Take 1 tablet by mouth 2 (two) times daily.     [provider]  Blood Glucose Monitoring Suppl (BLOOD GLUCOSE SYSTEM PAK) KIT Use as directed to monitor FSBS 1x daily. Dx: E11.9. Please dispense as Accu-Chek Aviva 01/18/18   [place], Velna Hatchet, MD  budesonide-formoterol [facility])  80-4.5 MCG/ACT inhaler Inhale 2 puffs into the lungs in the morning and at bedtime. Rinse your mouth and spit out the water after using this medication to prevent thrush 08/19/22   Anabel Halon, MD  Calcium Polycarbophil (FIBER-CAPS PO) Take by mouth daily at 6 (six) AM. Two po QHS    [provider]  Cholecalciferol (VITAMIN D3) 2000 UNITS TABS Take 2,000 Units by mouth 2 (two) times daily.    [provider]  clotrimazole-betamethasone (LOTRISONE) cream Apply 1 Application topically daily. 06/25/22   Anabel Halon, MD  dapagliflozin propanediol (FARXIGA) 10 MG TABS tablet Take 1 tablet (10 mg total) by mouth daily before breakfast. 06/18/22   Anabel Halon, MD  diclofenac Sodium (VOLTAREN) 1 % GEL Apply 2 g topically 4 (four) times daily. Patient taking differently: Apply 2 g topically 4 (four) times daily. prn 11/19/20   Heather Roberts, NP  fluocinonide cream (LIDEX) 0.05 % Apply 1 Application topically 2 (two) times daily as needed (poison ivy/bites/hives). 06/25/22   Anabel Halon, MD  Ginkgo Biloba 120 MG CAPS Take 120 mg by mouth in the morning and at bedtime.     [provider]  glipiZIDE (GLUCOTROL XL) 5 MG 24 hr tablet Take 1 tablet by mouth once daily with breakfast 08/04/22   Anabel Halon, MD  Glucose Blood (BLOOD GLUCOSE TEST STRIPS) STRP Use as directed to monitor FSBS  1x daily. Dx: E11.9. Please dispense as Accu-Chek Aviva 01/18/18   [place], Velna Hatchet, MD  Krill Oil 1000 MG CAPS Take 1,000 mg by mouth 2 (two) times daily.     [provider]  Lancets MISC Use as directed to monitor FSBS 1x daily. Dx: E11.9. Please dispense as Accu-Chek Aviva 01/18/18   [place], Velna Hatchet, MD  loperamide (IMODIUM A-D) 2 MG tablet Take 1 tablet (2 mg total) by mouth 4 (four) times daily as needed for diarrhea or loose stools. 04/23/21   [place], Baird Kay, FNP  LORazepam (ATIVAN) 0.5 MG tablet Take 1 tablet (0.5 mg total) by mouth 2 (two) times daily as needed for  anxiety. Patient not taking: Reported on 05/18/2022 05/20/21   Anabel Halon, MD  methylPREDNISolone (MEDROL DOSEPAK) 4 MG TBPK tablet Take as package instructions. 08/19/22   Anabel Halon, MD  metoprolol succinate (TOPROL-XL) 25 MG 24 hr tablet Take 1/2 (one-half) tablet by mouth once daily 06/23/22   [place] Ouch, [place] M, PA-C  Multiple Vitamin (MULTIVITAMIN WITH MINERALS) TABS tablet Take 1 tablet by mouth daily.    [provider]  naftifine (NAFTIN) 1 % cream Apply topically 2 (two) times daily as needed. 12/04/19   [place], Velna Hatchet, MD  rosuvastatin (CRESTOR) 5 MG tablet TAKE 1 TABLET BY MOUTH ONCE DAILY FOR CHOLESTEROL 07/27/22   Anabel Halon, MD  Semaglutide, 1 MG/DOSE, 4 MG/3ML SOPN Inject 1 mg as directed once a week. 06/21/22   Anabel Halon, MD  simethicone (MYLICON) 80 MG chewable tablet Chew 80 mg by mouth as needed for flatulence.     [provider]  tiZANidine (ZANAFLEX) 4 MG tablet Take 1 tablet (4 mg total) by mouth every 8 (eight) hours as needed for muscle spasms. 08/03/22   Anabel Halon, MD  torsemide (DEMADEX) 20 MG tablet Take 1 tablet (20 mg total) by mouth 3 (three) times a week. Patient taking differently: Take 10 mg by mouth every other day. 07/31/21   [place], Lennart Pall, PA-C  UNABLE TO FIND Please dispense custom molded foot orthotics and heel spurs Dx: E11.30 09/30/20   Heather Roberts, NP      Allergies    Arimidex [anastrozole], Other, Adhesive [tape], Lisinopril, Tetanus toxoids, and Tetracyclines & related    Review of Systems   Review of Systems  All other systems reviewed and are negative.   Physical Exam Updated Vital Signs BP (!) 152/53 (BP Location: Right Arm)   Pulse 80   Temp 98.5 F (36.9 C) (Oral)   Resp 18   Ht 5\' 5"  (1.651 m)   Wt 97.5 kg   SpO2 95%   BMI 35.78 kg/m  Physical Exam Vitals and nursing note reviewed.  Constitutional:      Appearance: She is well-developed.  HENT:     Head: Normocephalic and  atraumatic.  Eyes:     Extraocular Movements: Extraocular movements intact.  Cardiovascular:     Rate and Rhythm: Normal rate.  Pulmonary:     Effort: Pulmonary effort is normal.  Abdominal:     General: There is no distension.     Palpations: Abdomen is soft.     Tenderness: There is no abdominal tenderness.  Musculoskeletal:     Cervical back: Normal range of motion and neck supple.  Skin:    General: Skin is dry.  Neurological:     Mental Status: She is alert and oriented to person, place, and time.  ED Results / Procedures / Treatments   Labs (all labs ordered are listed, but only abnormal results are displayed) Labs Reviewed  CBC WITH DIFFERENTIAL/PLATELET - Abnormal; Notable for the following components:      Result Value   WBC 12.1 (*)    RBC 5.16 (*)    Neutro Abs 8.9 (*)    Monocytes Absolute 1.1 (*)    All other components within normal limits  BASIC METABOLIC PANEL - Abnormal; Notable for the following components:   Glucose, Bld 126 (*)    All other components within normal limits  CBC    EKG None  Radiology CT ABDOMEN PELVIS W CONTRAST  Result Date: 09/01/2022 CLINICAL DATA:  Left lower quadrant abdominal pain. Right lower quadrant pain. Hematochezia. Low pelvic pain. EXAM: CT ABDOMEN AND PELVIS WITH CONTRAST TECHNIQUE: Multidetector CT imaging of the abdomen and pelvis was performed using the standard protocol following bolus administration of intravenous contrast. RADIATION DOSE REDUCTION: This exam was performed according to the departmental dose-optimization program which includes automated exposure control, adjustment of the mA and/or kV according to patient size and/or use of iterative reconstruction technique. CONTRAST:  OMNIPAQUE IOHEXOL 300 MG/ML  SOLN COMPARISON:  06/12/2021 FINDINGS: Lower chest: Right base scarring. Incompletely imaged pacer. Normal heart size without pericardial or pleural effusion. Lad coronary artery calcification.  Hepatobiliary: Moderate caudate lobe enlargement on 22/2. No focal liver lesion. Normal gallbladder, without biliary ductal dilatation. Pancreas: Normal, without mass or ductal dilatation. Spleen: Old granulomatous disease within. Adrenals/Urinary Tract: Normal adrenal glands. Mild renal cortical thinning bilaterally. No hydronephrosis. Normal urinary bladder. Stomach/Bowel: Proximal gastric underdistention. Extensive colonic diverticulosis. Left-sided colonic wall thickening is most impressive (moderate) in the sigmoid with mild surrounding pericolonic edema including on 77/2. Normal terminal ileum and appendix. Normal small bowel. Vascular/Lymphatic: Aortic atherosclerosis. No abdominopelvic adenopathy. Reproductive: Normal uterus and adnexa. Other: No significant free fluid. Mild pelvic floor laxity. No free intraperitoneal air. Fat containing lateral right pelvic wall hernia including on 76/2. Superolateral to the inguinal canal and lateral to a typical femoral hernia. Moderate-sized fat containing ventral pelvic wall hernia including on 72/2. Musculoskeletal: Lumbar spondylosis, most significant at L3-4. Mild convex left lumbar spine curvature. IMPRESSION: 1. Left-sided colitis which given distribution and clinical history of hematochezia is favored to represent ischemia. Infection felt less likely. Recommend clinical exclusion of C difficile. 2. Nonspecific caudate lobe enlargement. Correlate with risk factors for mild cirrhosis, which could have this appearance. 3. Fat containing pelvic hernias as detailed above. 4. Coronary artery atherosclerosis. Aortic Atherosclerosis (ICD10-I70.0). Electronically Signed   By: Jeronimo Greaves M.D.   On: 09/01/2022 15:03    Procedures Procedures    Medications Ordered in ED Medications  iohexol (OMNIPAQUE) 300 MG/ML solution 100 mL (100 mLs Intravenous Contrast Given 09/01/22 1426)    ED Course/ Medical Decision Making/ A&P Clinical Course as of 09/02/22 1610  Wed  Sep 01, 2022  1454 Assumed care from Dr Rhunette Croft. 82 yo F with hx of multiple comorbidities including diverticulosis and IBS who presented with abdominal pain and hematochezia. Not on AC. Concerned for possible colitis. Awaiting CT and repeat CBC now.  On repeat evaluation is in no acute distress.  No abdominal tenderness to palpation or pain noted.  She is requesting to go home at this time. [RP]  1553 Hemoglobin: 14.1 Stable [RP]  1652 Discussed with Dr Charlies Constable from GI.  He feels that she could potentially have ischemic colitis as a watershed infarct which could cause her symptoms.  Thinks that it will likely be self-limited and does not require any interventions at this time or admission.  Recommends clear liquid diet for the rest of the day and to advance the diet tomorrow as tolerated.  Recommends GI follow-up in clinic.  Did discuss this with the patient and since she is completely asymptomatic at this time agrees with the plan and would prefer to go home currently even though I did offer admission to the patient.  Patient denies any infectious symptoms including fever or chills to me do not feel that she needs antibiotics currently.  Return precautions discussed with the patient prior to discharge. [RP]    Clinical Course User Index [RP] Rondel Baton, MD                             Medical Decision Making Amount and/or Complexity of Data Reviewed Labs: ordered. Decision-making details documented in ED Course. Radiology: ordered.  Risk Prescription drug management.   This patient presents to the ED with chief complaint(s) of bloody stool, intermittent cramping abdominal pain with pertinent past medical history of diabetes, AV block with pacemaker.The complaint involves an extensive differential diagnosis and also carries with it a high risk of complications and morbidity.    The differential diagnosis includes : Patient primarily has painless bleeding, therefore I think differential  diagnosis would include diverticulosis, internal hemorrhoids, external hemorrhoids.  Given that there is some cramping associated, other possibilities include colitis, ischemic versus infectious.  Patient has no fevers, chills.  The initial plan is to get basic labs, CBC, BMP, CT abdomen pelvis with contrast. Patient has brought stool sample with her.  It pretty much is blood, there is actually no formed stool in it.  I advised her that if she does have a bowel movement while in the emergency room, we will collect a sample.   Additional history obtained: Additional history obtained from spouse Records reviewed  I have reviewed previous CT scans, they revealed diverticulosis.  No recent colonoscopy noted  Independent labs interpretation:  The following labs were independently interpreted: Initial workup reveals no leukocytosis, normal hemoglobin at 14.5 and normal metabolic profile.  CT abdomen pelvis with contrast pending at this time.  Patient's care will be signed out to incoming team.  Dispo per repeat CBC and CT findings.  If CT is reassuring, CBC is normal, then patient likely can be discharged.  She is reliable, has support system.  Final Clinical Impression(s) / ED Diagnoses Final diagnoses:  Ischemic colitis (HCC)  Hematochezia    Rx / DC Orders ED Discharge Orders     None         Derwood Kaplan, MD 09/02/22 [id number]

## 2022-09-01 NOTE — ED Notes (Signed)
Pt has stool sample, brought from home, at bedside.

## 2022-09-01 NOTE — ED Provider Notes (Signed)
  Physical Exam  BP (!) 161/59   Pulse 73   Temp 98.5 F (36.9 C) (Oral)   Resp 16   Ht 5\' 5"  (1.651 m)   Wt 97.5 kg   SpO2 96%   BMI 35.78 kg/m   Physical Exam  Procedures  Procedures  ED Course / MDM   Clinical Course as of 09/01/22 1457  Wed Sep 01, 2022  1454 Assumed care from Dr Rhunette Croft. 82 yo F with hx of diverticulosis and IBS who presented with abdominal pain and hematochezia. Not on AC. Concerned for possible colitis. Awaiting CT and repeat CBC now.  [RP]    Clinical Course User Index [RP] Rondel Baton, MD   Medical Decision Making Amount and/or Complexity of Data Reviewed Labs: ordered. Radiology: ordered.  Risk Prescription drug management.   ***

## 2022-09-01 NOTE — ED Triage Notes (Signed)
Pt with abd cramping last night with diarrhea, bright red diarrhea early this morning. Took dose of OTC antidiarrheal without relief.  Pt states hx of IBS, denies any blood thinners.

## 2022-09-03 ENCOUNTER — Ambulatory Visit (INDEPENDENT_AMBULATORY_CARE_PROVIDER_SITE_OTHER): Payer: Medicare PPO | Admitting: Internal Medicine

## 2022-09-03 ENCOUNTER — Encounter: Payer: Self-pay | Admitting: Internal Medicine

## 2022-09-03 VITALS — BP 139/82 | HR 93 | Ht 65.0 in | Wt 220.2 lb

## 2022-09-03 DIAGNOSIS — J418 Mixed simple and mucopurulent chronic bronchitis: Secondary | ICD-10-CM | POA: Diagnosis not present

## 2022-09-03 DIAGNOSIS — K559 Vascular disorder of intestine, unspecified: Secondary | ICD-10-CM | POA: Insufficient documentation

## 2022-09-03 DIAGNOSIS — J4531 Mild persistent asthma with (acute) exacerbation: Secondary | ICD-10-CM | POA: Diagnosis not present

## 2022-09-03 DIAGNOSIS — Z09 Encounter for follow-up examination after completed treatment for conditions other than malignant neoplasm: Secondary | ICD-10-CM | POA: Diagnosis not present

## 2022-09-03 NOTE — Assessment & Plan Note (Signed)
Had hematochezia, which has resolved now Advised to avoid Imodium for now She had an episode of melena yesterday, but denies any other morning symptoms of acute blood loss anemia Hb was stable during recent ER visit Follow up with GI

## 2022-09-03 NOTE — Assessment & Plan Note (Signed)
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

## 2022-09-03 NOTE — Assessment & Plan Note (Signed)
Has persistent cough with mucoid expectoration for the last 4 weeks Has treated antibiotic and oral steroid course Check sputum culture

## 2022-09-03 NOTE — Patient Instructions (Signed)
Please start using Trelegy instead of Symbicort.  Please use Albuterol as needed for shortness of breath or wheezing.

## 2022-09-03 NOTE — Assessment & Plan Note (Signed)
ER chart reviewed, including imaging and blood tests Ischemic colitis - GI was consulted from the ER, but was advised conservative management, outpatient follow-up

## 2022-09-03 NOTE — Progress Notes (Signed)
Established Patient Office Visit  Subjective:  Patient ID: Julie Clay, female    DOB: [birth year]  Age: 82 y.o. MRN: [id number]  CC:  Chief Complaint  Patient presents with   Follow-up    ER follow up. Patient states she is still having coughing fits during the day with yellow mucus, she doesn't feel she is getting any better.    HPI Julie Clay is a 82 y.o. female with past medical history of HTN, asthma, type II DM with neuropathy, GERD, chronic diarrhea, breast ca. and GAD who presents for follow-up of ER visit.  She went to ER for hematochezia.  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  She tried taking Imodium before going to ER and has had only 1 small bowel movement since coming back to home.  She had dark stool, but denies any frank blood in stool.  She has an appointment with GI on 05/20.  She denies any recent worsening of fatigue, dizziness or worsening of dyspnea.  She still complains of cough with clear expectoration.  She has completed oral steroid for asthma exacerbation about a week ago.  She was also given a course of cefdinir from urgent care for acute sinusitis before that, but still has cough.  She is currently using Symbicort and as needed albuterol for asthma.  She has noticed mild wheezing.  She agrees to avoid further steroid due to her type II DM.  Past Medical History:  Diagnosis Date   2nd degree AV block 06/22/2017   Arthritis    Asthma    Bone spur    Breast cancer, left breast (HCC)    2012   Breast cancer, right breast (HCC)    2021   Chronic bronchitis (HCC)    COPD (chronic obstructive pulmonary disease) (HCC)    Diabetes mellitus without complication (HCC)    Phreesia 04/08/2020   Diastolic heart failure (HCC)    Essential hypertension    Fatty liver 2013   GERD (gastroesophageal reflux disease)    HSV (herpes simplex virus) infection    Hyperlipidemia    IBS (irritable bowel syndrome)     Lung nodule seen on imaging study 2013   Lymphedema    Left arm   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    Osteoarthritis    both knees, lower back, both shoulders; bone spurs to feet   Rectocele 06/25/2013   Posterior repair 11/06/13    Type II diabetes mellitus (HCC)    Ventral hernia     Past Surgical History:  Procedure Laterality Date   BREAST BIOPSY Left 04/2011   BREAST BIOPSY Left 06/22/13   BREAST BIOPSY Left 09/12/2013   Procedure: BREAST BIOPSY WITH NEEDLE LOCALIZATION;  Surgeon: Dalia Heading, MD;  Location: [facility];  Service: General;  Laterality: Left;   BREAST LUMPECTOMY Left 04/2011 X 2   BREAST SURGERY N/A    Phreesia 01/17/2020   CATARACT EXTRACTION W/ INTRAOCULAR LENS  IMPLANT, BILATERAL Bilateral    CATARACT EXTRACTION, BILATERAL     on different occassions    CESAREAN SECTION  1973   COLON SURGERY N/A    2014   DILATION AND CURETTAGE OF UTERUS  11/06/2013   HAND LIGAMENT RECONSTRUCTION Right ~ 2010   HYSTEROSCOPY WITH D & C N/A 11/06/2013   Procedure: DILATATION AND CURETTAGE /HYSTEROSCOPY;  Surgeon: Tilda Burrow, MD;  Location: [facility];  Service:  Gynecology;  Laterality: N/A;   INSERT / REPLACE / REMOVE PACEMAKER  06/22/2017   LUNG LOBECTOMY Right 1988   Fungal Infection   PACEMAKER IMPLANT N/A 06/22/2017   Procedure: PACEMAKER IMPLANT;  Surgeon: Duke Salvia, MD;  Location: [facility];  Service: Cardiovascular;  Laterality: N/A;   PARTIAL MASTECTOMY WITH NEEDLE LOCALIZATION Right 01/02/2020   Procedure: RIGHT PARTIAL MASTECTOMY AFTER NEEDLE LOCALIZATION;  Surgeon: Franky Macho, MD;  Location: [facility];  Service: General;  Laterality: Right;   POLYPECTOMY N/A 11/06/2013   Procedure: POLYPECTOMY (REMOVAL ENDOMETRIAL POLYP);  Surgeon: Tilda Burrow, MD;  Location: [facility];  Service: Gynecology;  Laterality: N/A;   RECTOCELE REPAIR N/A 11/06/2013   Procedure: POSTERIOR REPAIR (RECTOCELE);  Surgeon: Tilda Burrow, MD;  Location: [facility];  Service:  Gynecology;  Laterality: N/A;   SHOULDER ARTHROSCOPY WITH OPEN ROTATOR CUFF REPAIR Left 07/26/2012   TONSILLECTOMY  1949   TUBAL LIGATION      Family History  Problem Relation Age of Onset   Hypertension Mother    Heart disease Mother    Diabetes Mother    Kidney disease Mother        ESRD   Cancer Mother        Bladder   Hypertension Father    Hyperlipidemia Father    Heart disease Father    Diabetes Father    Cancer Father        Stomach Cancer   Diabetes Sister    Heart disease Sister    Alzheimer's disease Paternal Aunt    Stroke Maternal Grandfather    Cancer Paternal Grandfather        stomach   Cancer Maternal Uncle        prostate   Cancer Paternal Uncle        stomach    Social History   Socioeconomic History   Marital status: Married    Spouse name: Kandee Keen   Number of children: 2   Years of education: 12   Highest education level: Some college, no degree  Occupational History   Occupation: retired     Comment: Advice worker for [employer]  Tobacco Use   Smoking status: Never   Smokeless tobacco: Never  Vaping Use   Vaping Use: Never used  Substance and Sexual Activity   Alcohol use: Not Currently    Comment: Twice a year   Drug use: No   Sexual activity: Not Currently    Birth control/protection: Surgical  Other Topics Concern   Not on file  Social History Narrative   Not on file   Social Determinants of Health   Financial Resource Strain: Low Risk  (04/26/2022)   Overall Financial Resource Strain (CARDIA)    Difficulty of Paying Living Expenses: Not very hard  Food Insecurity: No Food Insecurity (04/26/2022)   Hunger Vital Sign    Worried About Running Out of Food in the Last Year: Never true    Ran Out of Food in the Last Year: Never true  Transportation Needs: No Transportation Needs (04/26/2022)   PRAPARE - Administrator, Civil Service (Medical): No    Lack of Transportation (Non-Medical): No  Physical Activity:  Inactive (04/26/2022)   Exercise Vital Sign    Days of Exercise per Week: 0 days    Minutes of Exercise per Session: 0 min  Stress: No Stress Concern Present (04/26/2022)   [employer] of Occupational Health - Occupational Stress Questionnaire  Feeling of Stress : Only a little  Social Connections: Moderately Integrated (04/26/2022)   Social Connection and Isolation Panel [NHANES]    Frequency of Communication with Friends and Family: Once a week    Frequency of Social Gatherings with Friends and Family: Once a week    Attends Religious Services: More than 4 times per year    Active Member of [employer] or Organizations: Yes    Attends Engineer, structural: More than 4 times per year    Marital Status: Married  Catering manager Violence: Not At Risk (04/26/2022)   Humiliation, Afraid, Rape, and Kick questionnaire    Fear of Current or Ex-Partner: No    Emotionally Abused: No    Physically Abused: No    Sexually Abused: No    Outpatient Medications Prior to Visit  Medication Sig Dispense Refill   acetaminophen (TYLENOL) 500 MG tablet Take 1,000 mg by mouth every 6 (six) hours as needed for moderate pain.     albuterol (VENTOLIN HFA) 108 (90 Base) MCG/ACT inhaler Inhale 2 puffs into the lungs every 6 (six) hours as needed for wheezing or shortness of breath. 8 g 0   Aloe Vera Leaf POWD Take 800 mg by mouth 2 (two) times daily.     Ascorbic Acid (VITAMIN C) 1000 MG tablet Take 1,000 mg by mouth 2 (two) times daily.     b complex vitamins tablet Take 1 tablet by mouth 2 (two) times daily.      Blood Glucose Monitoring Suppl (BLOOD GLUCOSE SYSTEM PAK) KIT Use as directed to monitor FSBS 1x daily. Dx: E11.9. Please dispense as Accu-Chek Aviva 1 each 1   budesonide-formoterol (SYMBICORT) 80-4.5 MCG/ACT inhaler Inhale 2 puffs into the lungs in the morning and at bedtime. Rinse your mouth and spit out the water after using this medication to prevent thrush 1 each 3   Calcium Polycarbophil  (FIBER-CAPS PO) Take by mouth daily at 6 (six) AM. Two po QHS     Cholecalciferol (VITAMIN D3) 2000 UNITS TABS Take 2,000 Units by mouth 2 (two) times daily.     clotrimazole-betamethasone (LOTRISONE) cream Apply 1 Application topically daily. 30 g 0   dapagliflozin propanediol (FARXIGA) 10 MG TABS tablet Take 1 tablet (10 mg total) by mouth daily before breakfast. 90 tablet 3   diclofenac Sodium (VOLTAREN) 1 % GEL Apply 2 g topically 4 (four) times daily. (Patient taking differently: Apply 2 g topically 4 (four) times daily. prn) 100 g 1   fluocinonide cream (LIDEX) 0.05 % Apply 1 Application topically 2 (two) times daily as needed (poison ivy/bites/hives). 30 g 11   Ginkgo Biloba 120 MG CAPS Take 120 mg by mouth in the morning and at bedtime.      glipiZIDE (GLUCOTROL XL) 5 MG 24 hr tablet Take 1 tablet by mouth once daily with breakfast 90 tablet 0   Glucose Blood (BLOOD GLUCOSE TEST STRIPS) STRP Use as directed to monitor FSBS 1x daily. Dx: E11.9. Please dispense as Accu-Chek Aviva 100 each 1   Krill Oil 1000 MG CAPS Take 1,000 mg by mouth 2 (two) times daily.      Lancets MISC Use as directed to monitor FSBS 1x daily. Dx: E11.9. Please dispense as Accu-Chek Aviva 100 each 1   loperamide (IMODIUM A-D) 2 MG tablet Take 1 tablet (2 mg total) by mouth 4 (four) times daily as needed for diarrhea or loose stools. 30 tablet 1   LORazepam (ATIVAN) 0.5 MG tablet Take 1 tablet (  0.5 mg total) by mouth 2 (two) times daily as needed for anxiety. (Patient not taking: Reported on 05/18/2022) 15 tablet 0   metoprolol succinate (TOPROL-XL) 25 MG 24 hr tablet Take 1/2 (one-half) tablet by mouth once daily 45 tablet 2   Multiple Vitamin (MULTIVITAMIN WITH MINERALS) TABS tablet Take 1 tablet by mouth daily.     naftifine (NAFTIN) 1 % cream Apply topically 2 (two) times daily as needed. 60 g 2   rosuvastatin (CRESTOR) 5 MG tablet TAKE 1 TABLET BY MOUTH ONCE DAILY FOR CHOLESTEROL 90 tablet 0   Semaglutide, 1 MG/DOSE,  4 MG/3ML SOPN Inject 1 mg as directed once a week. 9 mL 1   simethicone (MYLICON) 80 MG chewable tablet Chew 80 mg by mouth as needed for flatulence.      tiZANidine (ZANAFLEX) 4 MG tablet Take 1 tablet (4 mg total) by mouth every 8 (eight) hours as needed for muscle spasms. 90 tablet 1   torsemide (DEMADEX) 20 MG tablet Take 1 tablet (20 mg total) by mouth 3 (three) times a week. (Patient taking differently: Take 10 mg by mouth every other day.) 40 tablet 3   UNABLE TO FIND Please dispense custom molded foot orthotics and heel spurs Dx: E11.30 1 each 0   methylPREDNISolone (MEDROL DOSEPAK) 4 MG TBPK tablet Take as package instructions. 1 each 0   No facility-administered medications prior to visit.    Allergies  Allergen Reactions   Arimidex [Anastrozole] Other (See Comments)    Arthralgias and myalgias.  Improved with 3 week hiatus from drug.  Categorical side effect of drug class.   Other Hives and Itching   Adhesive [Tape] Other (See Comments)    "red and blistered"   Lisinopril     Dry hacking cough and tickle   Tetanus Toxoids    Tetracyclines & Related Other (See Comments)    unknown    ROS Review of Systems  Constitutional:  Negative for chills and fever.  HENT:  Negative for congestion, sinus pressure, sinus pain and sore throat.   Eyes:  Negative for pain and discharge.  Respiratory:  Positive for cough, shortness of breath and wheezing.   Cardiovascular:  Negative for chest pain and palpitations.  Gastrointestinal:  Positive for blood in stool and diarrhea. Negative for abdominal pain, nausea and vomiting.  Endocrine: Negative for polydipsia and polyuria.  Genitourinary:  Negative for dysuria and hematuria.  Musculoskeletal:  Negative for neck pain and neck stiffness.  Skin:  Positive for color change (Left face area mole). Negative for rash.  Neurological:  Negative for dizziness and weakness.  Psychiatric/Behavioral:  Negative for agitation and behavioral problems.  The patient is nervous/anxious.       Objective:    Physical Exam Vitals reviewed.  Constitutional:      General: She is not in acute distress.    Appearance: She is obese. She is not diaphoretic.  HENT:     Head: Normocephalic and atraumatic.     Nose: No congestion.     Mouth/Throat:     Mouth: Mucous membranes are moist.     Pharynx: No posterior oropharyngeal erythema.  Eyes:     General: No scleral icterus.    Extraocular Movements: Extraocular movements intact.  Cardiovascular:     Rate and Rhythm: Normal rate and regular rhythm.     Pulses: Normal pulses.     Heart sounds: Normal heart sounds. No murmur heard. Pulmonary:     Breath sounds: No wheezing or  rales.  Musculoskeletal:     Cervical back: Neck supple. No tenderness.     Right lower leg: No edema.     Left lower leg: No edema.  Skin:    General: Skin is warm.     Findings: Lesion (Brownish papule over left side of face and left upper arm - likely actinic keratoses) present. No rash.  Neurological:     General: No focal deficit present.     Mental Status: She is alert and oriented to person, place, and time.     Sensory: No sensory deficit.     Motor: No weakness.  Psychiatric:        Mood and Affect: Mood normal.        Behavior: Behavior normal.     BP 139/82 (BP Location: Right Arm, Patient Position: Sitting, Cuff Size: Large)   Pulse 93   Ht 5\' 5"  (1.651 m)   Wt 220 lb 3.2 oz (99.9 kg)   SpO2 90%   BMI 36.64 kg/m  Wt Readings from Last 3 Encounters:  09/03/22 220 lb 3.2 oz (99.9 kg)  09/01/22 215 lb (97.5 kg)  08/19/22 222 lb 9.6 oz (101 kg)    Lab Results  Component Value Date   TSH 2.980 06/18/2022   Lab Results  Component Value Date   WBC 10.4 09/01/2022   HGB 14.1 09/01/2022   HCT 42.9 09/01/2022   MCV 86.5 09/01/2022   PLT 203 09/01/2022   Lab Results  Component Value Date   NA 139 09/01/2022   K 3.8 09/01/2022   CO2 24 09/01/2022   GLUCOSE 126 (H) 09/01/2022   BUN 17  09/01/2022   CREATININE 0.88 09/01/2022   BILITOT 0.4 06/18/2022   ALKPHOS 62 06/18/2022   AST 19 06/18/2022   ALT 17 06/18/2022   PROT 6.7 06/18/2022   ALBUMIN 4.4 06/18/2022   CALCIUM 9.2 09/01/2022   ANIONGAP 9 09/01/2022   EGFR 69 06/18/2022   Lab Results  Component Value Date   CHOL 170 06/18/2022   Lab Results  Component Value Date   HDL 47 06/18/2022   Lab Results  Component Value Date   LDLCALC 78 06/18/2022   Lab Results  Component Value Date   TRIG 280 (H) 06/18/2022   Lab Results  Component Value Date   CHOLHDL 3.6 06/18/2022   Lab Results  Component Value Date   HGBA1C 6.5 (H) 06/18/2022      Assessment & Plan:   Problem List Items Addressed This Visit       Respiratory   Asthma with acute exacerbation    Likely has asthma exacerbation from recent acute sinusitis - Urgent care chart reviewed Persistent cough and dyspnea with wheezing Switched from Symbicort to Trelegy, sample provided Continue albuterol as needed for dyspnea or wheezing Recently completed Medrol Dosepak, would avoid further oral steroids due to her type II DM Recently completed cefdinir for acute sinusitis      Mixed simple and mucopurulent chronic bronchitis (HCC)    Has persistent cough with mucoid expectoration for the last 4 weeks Has treated antibiotic and oral steroid course Check sputum culture      Relevant Orders   Gram Stain w/Sputum Cult Rflx     Digestive   Ischemic colitis (HCC) - Primary    Had hematochezia, which has resolved now Advised to avoid Imodium for now She had an episode of melena yesterday, but denies any other morning symptoms of acute blood loss anemia Hb was  stable during recent ER visit Follow up with GI        Other   Encounter for examination following treatment at hospital    ER chart reviewed, including imaging and blood tests Ischemic colitis - GI was consulted from the ER, but was advised conservative management, outpatient  follow-up       No orders of the defined types were placed in this encounter.   Follow-up: No follow-ups on file.    Anabel Halon, MD

## 2022-09-05 NOTE — Progress Notes (Unsigned)
GI Office Note    Referring Provider: Anabel Halon, MD Primary Care Physician:  Anabel Halon, MD Primary Gastroenterologist: Dolores Frame, MD  Date:  09/06/2022  ID:  Julie Clay, Julie Clay [birth year], MRN [id number]   Chief Complaint   Chief Complaint  Patient presents with   Follow-up    Pt following up after ED visit. States she is better   History of Present Illness  Julie Clay is a 82 y.o. 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  Colonoscopy: reportedly prior to 2013. Performed at [facility]? - told she had adhesions to her bowel from tubal ligation.  EGD: reportedly > 10-11 years ago.   Last office visit 12/11/21. Doing better with imitation of fiber.  She is taking 2 fiber capsules daily.  Having intermittent diarrhea and constipation with intermittent abdominal pain and gas.  The first month low-fiber she has mostly loose stools but currently having 2-3/week.  Taking simethicone for gas.  Denies any rectal bleeding or melena.  Not eating large meals but reported she was eating more than prior.  States stools are somewhat improved since stopping metformin.  Only glasses of water per day.  Unable to tolerate low FODMAP diet per patient.  She does report tomatoes and onions worsening her symptoms.  Only takes Imodium.  Having reflux with certain dietary triggers but uses baking soda to help once per week.  ED visit 09/01/2022 for multiple episodes of bloody stool.  Reported about 5 bloody stool frank blood, no clots.  She had reported intermittent abdominal cramping in the lower abdomen prior to arrival but no pain since presentation to the ED.  She did report a history of diverticulosis per records.  Hemoglobin 14.1.  ED consulted Dr. Jena Gauss who felt as though this was potentially ischemic colitis as a  watershed infarct and that it would likely be self-limiting and does not require any interventions or admission.  He recommended clear liquid diet for the rest of the day advanced diet as tolerated.  She did not have any infectious symptoms.  Glucose mildly elevated, normal LFTs.  CT A/P as noted below.  CT A/P 09/01/22: -Left-sided colonic wall thickening most impressive in the sigmoid with mild surrounding pericolonic edema likely representing ischemic colitis, less likely infection. -Normal terminal ileum -Nonspecific caudate lobe enlargement -Fat-containing ventral pelvic hernia -Normal pancreas and spleen  Today:  Had one episodes Wednesday evening and Thursday morning and those were darker. Since then she has not had anymore rectal bleeding. Started out as a larger loser BM and then the blood came.   Can still feel when gas or feces are moving in her bottom/colon and feels as though that means she is healing as she is not really cramping. No N/V. Appetite is good. No unintentional. Does not take blood thinners. Has a pacemaker has had for many years and is pacing her all the time.   Recently recovering from bronchitis.   Still has issues with IBS but reports that is getting better. Sometimes has 5-6 per day and then may not go for 2-3 days. Uses imodium rarely. Taking fiber twice daily and this helps a lot. (Takes capsules - 3 in AM and 2 in PM).   Stets she had difficulty with last colonoscopy. Also reports history of surgery for rectocele and that makes her have  some incontinence. Has to wear depends when she goes out. Sometimes does not get a warning.    Current Outpatient Medications  Medication Sig Dispense Refill   acetaminophen (TYLENOL) 500 MG tablet Take 1,000 mg by mouth every 6 (six) hours as needed for moderate pain.     albuterol (VENTOLIN HFA) 108 (90 Base) MCG/ACT inhaler Inhale 2 puffs into the lungs every 6 (six) hours as needed for wheezing or shortness of breath. 8 g  0   Aloe Vera Leaf POWD Take 800 mg by mouth 2 (two) times daily.     Ascorbic Acid (VITAMIN C) 1000 MG tablet Take 1,000 mg by mouth 2 (two) times daily.     b complex vitamins tablet Take 1 tablet by mouth 2 (two) times daily.      Blood Glucose Monitoring Suppl (BLOOD GLUCOSE SYSTEM PAK) KIT Use as directed to monitor FSBS 1x daily. Dx: E11.9. Please dispense as Accu-Chek Aviva 1 each 1   Calcium Polycarbophil (FIBER-CAPS PO) Take by mouth daily at 6 (six) AM. Two po QHS     Cholecalciferol (VITAMIN D3) 2000 UNITS TABS Take 2,000 Units by mouth 2 (two) times daily.     clotrimazole-betamethasone (LOTRISONE) cream Apply 1 Application topically daily. 30 g 0   dapagliflozin propanediol (FARXIGA) 10 MG TABS tablet Take 1 tablet (10 mg total) by mouth daily before breakfast. 90 tablet 3   diclofenac Sodium (VOLTAREN) 1 % GEL Apply 2 g topically 4 (four) times daily. (Patient taking differently: Apply 2 g topically 4 (four) times daily. prn) 100 g 1   fluocinonide cream (LIDEX) 0.05 % Apply 1 Application topically 2 (two) times daily as needed (poison ivy/bites/hives). 30 g 11   Ginkgo Biloba 120 MG CAPS Take 120 mg by mouth in the morning and at bedtime.      glipiZIDE (GLUCOTROL XL) 5 MG 24 hr tablet Take 1 tablet by mouth once daily with breakfast 90 tablet 0   Glucose Blood (BLOOD GLUCOSE TEST STRIPS) STRP Use as directed to monitor FSBS 1x daily. Dx: E11.9. Please dispense as Accu-Chek Aviva 100 each 1   Krill Oil 1000 MG CAPS Take 1,000 mg by mouth 2 (two) times daily.      Lancets MISC Use as directed to monitor FSBS 1x daily. Dx: E11.9. Please dispense as Accu-Chek Aviva 100 each 1   loperamide (IMODIUM A-D) 2 MG tablet Take 1 tablet (2 mg total) by mouth 4 (four) times daily as needed for diarrhea or loose stools. 30 tablet 1   metoprolol succinate (TOPROL-XL) 25 MG 24 hr tablet Take 1/2 (one-half) tablet by mouth once daily 45 tablet 2   Multiple Vitamin (MULTIVITAMIN WITH MINERALS) TABS  tablet Take 1 tablet by mouth daily.     naftifine (NAFTIN) 1 % cream Apply topically 2 (two) times daily as needed. 60 g 2   rosuvastatin (CRESTOR) 5 MG tablet TAKE 1 TABLET BY MOUTH ONCE DAILY FOR CHOLESTEROL 90 tablet 0   simethicone (MYLICON) 80 MG chewable tablet Chew 80 mg by mouth as needed for flatulence.      tiZANidine (ZANAFLEX) 4 MG tablet Take 1 tablet (4 mg total) by mouth every 8 (eight) hours as needed for muscle spasms. 90 tablet 1   budesonide-formoterol (SYMBICORT) 80-4.5 MCG/ACT inhaler Inhale 2 puffs into the lungs in the morning and at bedtime. Rinse your mouth and spit out the water after using this medication to prevent thrush (Patient not taking: Reported on 09/06/2022) 1 each 3  LORazepam (ATIVAN) 0.5 MG tablet Take 1 tablet (0.5 mg total) by mouth 2 (two) times daily as needed for anxiety. (Patient not taking: Reported on 05/18/2022) 15 tablet 0   Semaglutide, 1 MG/DOSE, 4 MG/3ML SOPN Inject 1 mg as directed once a week. (Patient not taking: Reported on 09/06/2022) 9 mL 1   torsemide (DEMADEX) 20 MG tablet Take 1 tablet (20 mg total) by mouth 3 (three) times a week. (Patient not taking: Reported on 09/06/2022) 40 tablet 3   UNABLE TO FIND Please dispense custom molded foot orthotics and heel spurs Dx: E11.30 1 each 0   No current facility-administered medications for this visit.    Past Medical History:  Diagnosis Date   2nd degree AV block 06/22/2017   Arthritis    Asthma    Bone spur    Breast cancer, left breast (HCC)    2012   Breast cancer, right breast (HCC)    2021   Chronic bronchitis (HCC)    COPD (chronic obstructive pulmonary disease) (HCC)    Diabetes mellitus without complication (HCC)    Phreesia 04/08/2020   Diastolic heart failure (HCC)    Essential hypertension    Fatty liver 2013   GERD (gastroesophageal reflux disease)    HSV (herpes simplex virus) infection    Hyperlipidemia    IBS (irritable bowel syndrome)    Lung nodule seen on  imaging study 2013   Lymphedema    Left arm   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    Osteoarthritis    both knees, lower back, both shoulders; bone spurs to feet   Rectocele 06/25/2013   Posterior repair 11/06/13    Type II diabetes mellitus (HCC)    Ventral hernia     Past Surgical History:  Procedure Laterality Date   BREAST BIOPSY Left 04/2011   BREAST BIOPSY Left 06/22/13   BREAST BIOPSY Left 09/12/2013   Procedure: BREAST BIOPSY WITH NEEDLE LOCALIZATION;  Surgeon: Dalia Heading, MD;  Location: [facility];  Service: General;  Laterality: Left;   BREAST LUMPECTOMY Left 04/2011 X 2   BREAST SURGERY N/A    Phreesia 01/17/2020   CATARACT EXTRACTION W/ INTRAOCULAR LENS  IMPLANT, BILATERAL Bilateral    CATARACT EXTRACTION, BILATERAL     on different occassions    CESAREAN SECTION  1973   COLON SURGERY N/A    2014   DILATION AND CURETTAGE OF UTERUS  11/06/2013   HAND LIGAMENT RECONSTRUCTION Right ~ 2010   HYSTEROSCOPY WITH D & C N/A 11/06/2013   Procedure: DILATATION AND CURETTAGE /HYSTEROSCOPY;  Surgeon: Tilda Burrow, MD;  Location: [facility];  Service: Gynecology;  Laterality: N/A;   INSERT / REPLACE / REMOVE PACEMAKER  06/22/2017   LUNG LOBECTOMY Right 1988   Fungal Infection   PACEMAKER IMPLANT N/A 06/22/2017   Procedure: PACEMAKER IMPLANT;  Surgeon: Duke Salvia, MD;  Location[facility];  Service: Cardiovascular;  Laterality: N/A;   PARTIAL MASTECTOMY WITH NEEDLE LOCALIZATION Right 01/02/2020   Procedure: RIGHT PARTIAL MASTECTOMY AFTER NEEDLE LOCALIZATION;  Surgeon: Franky Macho, MD;  Location: [facility];  Service: General;  Laterality: Right;   POLYPECTOMY N/A 11/06/2013   Procedure: POLYPECTOMY (REMOVAL ENDOMETRIAL POLYP);  Surgeon: Tilda Burrow, MD;  Location: [facility];  Service: Gynecology;  Laterality: N/A;   RECTOCELE REPAIR N/A 11/06/2013   Procedure: POSTERIOR REPAIR (RECTOCELE);  Surgeon: Tilda Burrow, MD;  Location: [facility];  Service: Gynecology;  Laterality: N/A;   SHOULDER ARTHROSCOPY WITH OPEN ROTATOR CUFF REPAIR Left 07/26/2012   TONSILLECTOMY  1949   TUBAL LIGATION      Family History  Problem Relation Age of Onset   Hypertension Mother    Heart disease Mother    Diabetes Mother    Kidney disease Mother        ESRD   Cancer Mother        Bladder   Hypertension Father    Hyperlipidemia Father    Heart disease Father    Diabetes Father    Cancer Father        Stomach Cancer   Diabetes Sister    Heart disease Sister    Alzheimer's disease Paternal Aunt    Stroke Maternal Grandfather    Cancer Paternal Grandfather        stomach   Cancer Maternal Uncle        prostate   Cancer Paternal Uncle        stomach    Allergies as of 09/06/2022 - Review Complete 09/06/2022  Allergen Reaction Noted   Arimidex [anastrozole] Other (See Comments) 04/18/2013   Other Hives and Itching 01/17/2020   Adhesive [tape] Other (See Comments) 10/08/2011   Lisinopril  06/15/2012   Tetanus toxoids  04/25/2018   Tetracyclines & related Other (See Comments) 10/08/2011    Social History   Socioeconomic History   Marital status: Married    Spouse name: Kandee Keen   Number of children: 2   Years of education: 12   Highest education level: Some college, no degree  Occupational History   Occupation: retired     Comment: Advice worker for [employer]  Tobacco Use   Smoking status: Never   Smokeless tobacco: Never  Vaping Use   Vaping Use: Never used  Substance and Sexual Activity   Alcohol use: Not Currently    Comment: Twice a year   Drug use: No   Sexual activity: Not Currently    Birth control/protection: Surgical  Other Topics Concern   Not on file  Social History Narrative   Not on file   Social Determinants of Health   Financial Resource Strain: Low Risk  (04/26/2022)   Overall Financial Resource Strain (CARDIA)    Difficulty of Paying Living Expenses: Not very hard  Food Insecurity: No Food Insecurity (04/26/2022)    Hunger Vital Sign    Worried About Running Out of Food in the Last Year: Never true    Ran Out of Food in the Last Year: Never true  Transportation Needs: No Transportation Needs (04/26/2022)   PRAPARE - Administrator, Civil Service (Medical): No    Lack of Transportation (Non-Medical): No  Physical Activity: Inactive (04/26/2022)   Exercise Vital Sign    Days of Exercise per Week: 0 days    Minutes of Exercise per Session: 0 min  Stress: No Stress Concern Present (04/26/2022)   [employer] of Occupational Health - Occupational Stress Questionnaire    Feeling of Stress : Only a little  Social Connections: Moderately Integrated (04/26/2022)   Social Connection and Isolation Panel [NHANES]    Frequency of Communication with Friends and Family: Once a week    Frequency of Social Gatherings with Friends and Family: Once a week    Attends Religious Services: More than 4 times per year    Active Member of [employer] or Organizations: Yes    Attends Banker Meetings: More than 4 times  per year    Marital Status: Married     Review of Systems   Gen: Denies fever, chills, anorexia. Denies fatigue, weakness, weight loss.  CV: Denies chest pain, palpitations, syncope, peripheral edema, and claudication. Resp: Denies dyspnea at rest, cough, wheezing, coughing up blood, and pleurisy. GI: See HPI Derm: Denies rash, itching, dry skin Psych: Denies depression, anxiety, memory loss, confusion. No homicidal or suicidal ideation.  Heme: Denies bruising, bleeding, and enlarged lymph nodes.   Physical Exam   BP 138/84   Pulse 71   Temp 97.8 F (36.6 C)   Ht 5\' 5"  (1.651 m)   Wt 219 lb 9.6 oz (99.6 kg)   BMI 36.54 kg/m   General:   Alert and oriented. No distress noted. Pleasant and cooperative.  Head:  Normocephalic and atraumatic. Eyes:  Conjuctiva clear without scleral icterus. Mouth:  Oral mucosa pink and moist. Good dentition. No lesions. Lungs:  Clear to  auscultation bilaterally. No wheezes, rales, or rhonchi. No distress.  Heart:  S1, S2 present without murmurs appreciated.  Abdomen:  +BS, soft, non-tender and non-distended. No rebound or guarding. No HSM or masses noted. Rectal: deferred Msk:  Symmetrical without gross deformities. Normal posture. Extremities:  Without edema. Neurologic:  Alert and  oriented x4 Psych:  Alert and cooperative. Normal mood and affect.   Assessment  Julie Clay is a 82 y.o. 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  IBS with constipation and diarrhea: Symptoms remain at baseline.  Occasionally has 5-6 looser stools per day then go 2-3 days without a bowel movement.  Uses Imodium rarely.  Has continued fiber supplement twice daily which helps significantly with her bowels.  Uses Gas-X as needed.  Admits to concerns of not drinking enough liquid especially in the evenings with her fiber.  Ischemic colitis: Experienced 5 episodes of BRBPR on 5/15 and then proceeded to the ED.  Hemoglobin remained stable.  CT with evidence of left-sided colonic wall thickening and sigmoid with findings consistent with possible ischemic colitis.  Had 2 episodes of rectal bleeding post ED visit however very mild and eventually was darker in color but has had none since Thursday 5/16.  Previously having intermittent cramping abdominal pain however this is mostly subsided as well.  Given this was self-limiting, her age, comorbidities, and prior difficulties with colonoscopies we will hold off on scheduling a follow-up colonoscopy for now unless she develops recurrent symptoms or significant anemia.   PLAN   Keep f/u with Chelsea in 3 months.  Avoid trigger foods. Continue gas ex as needed for cramping Continue fiber supplement twice daily Increase water  intake.     Brooke Bonito, MSN, FNP-BC, AGACNP-BC [facility]  I have reviewed the note and agree with the APP's assessment as described in this progress note  Katrinka Blazing, MD Gastroenterology and Hepatology [facility]

## 2022-09-06 ENCOUNTER — Encounter: Payer: Self-pay | Admitting: Gastroenterology

## 2022-09-06 ENCOUNTER — Ambulatory Visit (INDEPENDENT_AMBULATORY_CARE_PROVIDER_SITE_OTHER): Payer: Medicare PPO | Admitting: Gastroenterology

## 2022-09-06 ENCOUNTER — Ambulatory Visit: Payer: Medicare PPO | Admitting: Internal Medicine

## 2022-09-06 VITALS — BP 138/84 | HR 71 | Temp 97.8°F | Ht 65.0 in | Wt 219.6 lb

## 2022-09-06 DIAGNOSIS — K559 Vascular disorder of intestine, unspecified: Secondary | ICD-10-CM

## 2022-09-06 DIAGNOSIS — K582 Mixed irritable bowel syndrome: Secondary | ICD-10-CM | POA: Diagnosis not present

## 2022-09-06 NOTE — Patient Instructions (Signed)
Continue fiber supplementation  Continue to avoid your trigger foods.  Please let us know if you have any recurrent bleeding, for now we will hold off on any procedures.  Keep your follow up appointment with [facility] in August.  It was a pleasure to see you today. I want to create trusting relationships with patients. If you receive a survey regarding your visit,  I greatly appreciate you taking time to fill this out on paper or through your MyChart. I value your feedback.  Brooke Bonito, MSN, FNP-BC, AGACNP-BC [facility]

## 2022-09-07 ENCOUNTER — Telehealth: Payer: Self-pay | Admitting: Internal Medicine

## 2022-09-07 DIAGNOSIS — J418 Mixed simple and mucopurulent chronic bronchitis: Secondary | ICD-10-CM | POA: Diagnosis not present

## 2022-09-07 NOTE — Telephone Encounter (Signed)
Julie Clay dropped off urine in lab, teams message sent out

## 2022-09-07 NOTE — Telephone Encounter (Signed)
Suppose to be sputum not urine

## 2022-09-11 LAB — GRAM STAIN W/SPUTUM CULT RFLX

## 2022-09-11 LAB — SPUTUM CULTURE

## 2022-09-17 ENCOUNTER — Other Ambulatory Visit (HOSPITAL_COMMUNITY): Payer: Self-pay | Admitting: Internal Medicine

## 2022-09-17 DIAGNOSIS — Z1231 Encounter for screening mammogram for malignant neoplasm of breast: Secondary | ICD-10-CM

## 2022-09-22 ENCOUNTER — Ambulatory Visit (INDEPENDENT_AMBULATORY_CARE_PROVIDER_SITE_OTHER): Payer: Medicare PPO

## 2022-09-22 DIAGNOSIS — I441 Atrioventricular block, second degree: Secondary | ICD-10-CM

## 2022-09-22 LAB — CUP PACEART REMOTE DEVICE CHECK
Battery Remaining Longevity: 44 mo
Battery Remaining Percentage: 45 %
Battery Voltage: 2.98 V
Brady Statistic AP VP Percent: 6.5 %
Brady Statistic AP VS Percent: 1 %
Brady Statistic AS VP Percent: 94 %
Brady Statistic AS VS Percent: 1 %
Brady Statistic RA Percent Paced: 6.3 %
Brady Statistic RV Percent Paced: 99 %
Date Time Interrogation Session: 20240605020013
Implantable Lead Connection Status: 753985
Implantable Lead Connection Status: 753985
Implantable Lead Implant Date: 20190306
Implantable Lead Implant Date: 20190306
Implantable Lead Location: 753859
Implantable Lead Location: 753860
Implantable Lead Model: 5076
Implantable Lead Model: 5076
Implantable Pulse Generator Implant Date: 20190306
Lead Channel Impedance Value: 350 Ohm
Lead Channel Impedance Value: 430 Ohm
Lead Channel Pacing Threshold Amplitude: 0.5 V
Lead Channel Pacing Threshold Amplitude: 0.75 V
Lead Channel Pacing Threshold Pulse Width: 0.5 ms
Lead Channel Pacing Threshold Pulse Width: 0.5 ms
Lead Channel Sensing Intrinsic Amplitude: 3.6 mV
Lead Channel Sensing Intrinsic Amplitude: 6.8 mV
Lead Channel Setting Pacing Amplitude: 2 V
Lead Channel Setting Pacing Amplitude: 2.5 V
Lead Channel Setting Pacing Pulse Width: 0.5 ms
Lead Channel Setting Sensing Sensitivity: 4 mV
Pulse Gen Model: 2272
Pulse Gen Serial Number: 9001027

## 2022-10-19 NOTE — Progress Notes (Signed)
Remote pacemaker transmission.   

## 2022-10-25 ENCOUNTER — Other Ambulatory Visit: Payer: Self-pay | Admitting: Internal Medicine

## 2022-11-02 ENCOUNTER — Other Ambulatory Visit: Payer: Self-pay | Admitting: Internal Medicine

## 2022-11-02 DIAGNOSIS — E1143 Type 2 diabetes mellitus with diabetic autonomic (poly)neuropathy: Secondary | ICD-10-CM

## 2022-11-23 ENCOUNTER — Ambulatory Visit: Payer: Medicare PPO | Attending: Cardiology | Admitting: Cardiology

## 2022-11-23 ENCOUNTER — Encounter: Payer: Self-pay | Admitting: Cardiology

## 2022-11-23 VITALS — BP 138/68 | HR 66 | Ht 65.0 in | Wt 219.0 lb

## 2022-11-23 DIAGNOSIS — E782 Mixed hyperlipidemia: Secondary | ICD-10-CM

## 2022-11-23 DIAGNOSIS — I2510 Atherosclerotic heart disease of native coronary artery without angina pectoris: Secondary | ICD-10-CM

## 2022-11-23 DIAGNOSIS — I5032 Chronic diastolic (congestive) heart failure: Secondary | ICD-10-CM

## 2022-11-23 DIAGNOSIS — Z950 Presence of cardiac pacemaker: Secondary | ICD-10-CM | POA: Diagnosis not present

## 2022-11-23 NOTE — Progress Notes (Signed)
Cardiology Office Note  Date: 11/23/2022   ID: Julie Clay, DOB December 10, 1940, MRN [id number]  History of Present Illness: Julie Clay is an 82 y.o. female last seen in January.  She is here today for a follow-up visit.  She reports no major change in status, specifically no angina, no increasing dyspnea on exertion over baseline, no palpitations.  Her weight is stable and she does not report any progressive fluid retention.  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 Graciela Husbands.  Device interrogation in June indicated normal function.  I reviewed her medications.  She continues on [place], Toprol-XL, Crestor, and [place] as before.  Lab work in May showed normal renal function.  Her last LDL was 78.  ECG today shows a ventricular paced rhythm with atrial sensing.  Physical Exam: VS:  BP 138/68   Pulse 66   Ht 5\' 5"  (1.651 m)   Wt 219 lb (99.3 kg)   SpO2 95%   BMI 36.44 kg/m , BMI Body mass index is 36.44 kg/m.  Wt Readings from Last 3 Encounters:  11/23/22 219 lb (99.3 kg)  09/06/22 219 lb 9.6 oz (99.6 kg)  09/03/22 220 lb 3.2 oz (99.9 kg)    General: Patient appears comfortable at rest. HEENT: Conjunctiva and lids normal. Neck: Supple, no elevated JVP or carotid bruits. Lungs: Clear to auscultation, nonlabored breathing at rest. Cardiac: Regular rate and rhythm, no S3 or significant systolic murmur. Extremities: No pitting edema.  ECG:  An ECG dated 11/11/2021 was personally reviewed today and demonstrated:  Ventricular paced rhythm with atrial tracking.  Labwork: 06/18/2022: ALT 17; AST 19; TSH 2.980 09/01/2022: BUN 17; Creatinine, Ser 0.88; Hemoglobin 14.1; Platelets 203; Potassium 3.8; Sodium 139     Component Value Date/Time   CHOL 170 06/18/2022 1140   TRIG 280 (H) 06/18/2022 1140   HDL 47 06/18/2022 1140   CHOLHDL 3.6 06/18/2022 1140   CHOLHDL 2.7 04/08/2020 0925   VLDL 43 (H) 09/20/2016 1015   LDLCALC 78 06/18/2022 1140   LDLCALC 53 04/08/2020 0925    LDLDIRECT 102 (H) 12/09/2011 1035   Other Studies Reviewed Today:  Echocardiogram 04/07/2022:  1. Left ventricular ejection fraction, by estimation, is 60 to 65%. The  left ventricle has normal function. The left ventricle has no regional  wall motion abnormalities. There is moderate asymmetric left ventricular  hypertrophy of the basal segment.  Left ventricular diastolic parameters are consistent with Grade I  diastolic dysfunction (impaired relaxation).   2. Right ventricular systolic function is normal. The right ventricular  size is normal. There is normal pulmonary artery systolic pressure. The  estimated right ventricular systolic pressure is 24.0 mmHg.   3. Left atrial size was mildly dilated.   4. The mitral valve is abnormal. Mild mitral valve regurgitation. No  evidence of mitral stenosis. Moderate mitral annular calcification.   5. The aortic valve is tricuspid. There is mild calcification of the  aortic valve. Aortic valve regurgitation is not visualized. No aortic  stenosis is present.   6. The inferior vena cava is normal in size with greater than 50%  respiratory variability, suggesting right atrial pressure of 3 mmHg.    Coronary CTA 04/09/2022: IMPRESSION: 1. Moderate CAD in mid LCx, 50-69% stenosis, CADRADS 3. CT FFR will be performed and reported separately.   2. Coronary calcium score is 484, which places the patient in the 80th percentile for age and sex matched control.   3. Normal coronary origins with  right dominance.   4.  Severe mitral annular calcification.   Coronary CTA FFR 04/10/2022: FINDINGS: 1. Left Main: Low likelihood of hemodynamic significance.   2. LAD: Low likelihood of hemodynamic significance. 3. LCX: Low likelihood of hemodynamic significance, FFR 0.90 beyond the mid LCx lesion. 4. RCA: Not evaluated due to significant misregistration artifact.   IMPRESSION:   1. CT FFR analysis showed low likelihood of  hemodynamically significant stenosis in the left circumflex artery.  Assessment and Plan:  1.  HFpEF, LVEF 60 to 65% with grade 1 diastolic dysfunction and normal estimated RVSP by echocardiogram in December 2023.  She is clinically stable at this time in terms of weight and fluid status.  Continue current regimen including Farxiga and Demadex.   2.  St. Jude pacemaker in place with history of symptomatic second-degree type II heart block.  She continues to follow with Dr. Graciela Husbands.   3.  Chronic dyspnea on exertion, likely multifactorial.  Recent coronary CTA did not demonstrate obstructive CAD as etiology and no substantial change in cardiac structural assessment by echocardiogram.    4.  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  She denies any active angina.  Continue Crestor.  Disposition:  Follow up  6 months.  Signed, Jonelle Sidle, M.D., F.A.C.C. [facility]

## 2022-11-23 NOTE — Patient Instructions (Signed)
Medication Instructions:  Your physician recommends that you continue on your current medications as directed. Please refer to the Current Medication list given to you today.   Labwork: None today  Testing/Procedures: None today  Follow-Up: 6 months  Any Other Special Instructions Will Be Listed Below (If Applicable).  If you need a refill on your cardiac medications before your next appointment, please call your pharmacy.  

## 2022-12-08 ENCOUNTER — Ambulatory Visit (HOSPITAL_COMMUNITY)
Admission: RE | Admit: 2022-12-08 | Discharge: 2022-12-08 | Disposition: A | Discharge status: Home / Self Care | Payer: Medicare PPO | Source: Ambulatory Visit | Attending: Internal Medicine | Admitting: Internal Medicine

## 2022-12-08 DIAGNOSIS — Z1231 Encounter for screening mammogram for malignant neoplasm of breast: Secondary | ICD-10-CM | POA: Insufficient documentation

## 2022-12-13 ENCOUNTER — Encounter (INDEPENDENT_AMBULATORY_CARE_PROVIDER_SITE_OTHER): Payer: Self-pay | Admitting: Gastroenterology

## 2022-12-13 ENCOUNTER — Ambulatory Visit (INDEPENDENT_AMBULATORY_CARE_PROVIDER_SITE_OTHER): Payer: Medicare PPO | Admitting: Gastroenterology

## 2022-12-13 VITALS — BP 128/80 | HR 75 | Temp 98.1°F | Ht 65.0 in | Wt 218.1 lb

## 2022-12-13 DIAGNOSIS — K582 Mixed irritable bowel syndrome: Secondary | ICD-10-CM | POA: Diagnosis not present

## 2022-12-13 DIAGNOSIS — A609 Anogenital herpesviral infection, unspecified: Secondary | ICD-10-CM | POA: Diagnosis not present

## 2022-12-13 MED ORDER — ACYCLOVIR 5 % EX OINT
TOPICAL_OINTMENT | CUTANEOUS | 0 refills | Status: DC
Start: 2022-12-13 — End: 2023-06-01

## 2022-12-13 NOTE — Patient Instructions (Signed)
We will treat rectal lesions as HSV with topical acyclovir 6x per day x7 days Please let me know if symptoms not improving Continue with fiber and good water intake  Follow up 6 months

## 2022-12-13 NOTE — Progress Notes (Signed)
Referring Provider: Anabel Halon, MD Primary Care Physician:  Anabel Halon, MD Primary GI Physician: Dr. Levon Hedger   Chief Complaint  Patient presents with   Irritable Bowel Syndrome    Follow up IBS with constipation and diarrhea. States she is doing better. Taking fiber 3 in the morning and 2 at night.    Rectal Pain    Bump near rectal area that came up 2 -3 days ago. Itchy and painful.    HPI:   Julie Clay is a 82 y.o. 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 had a few episodes of darker stools, no rectal bleeding. Having 5-6 BMs per day some days, then may not go for 2-3 days, taking fiber BID (3 in a.m., 2 in pm.) which she felt helped a lot   Recommended to continue with fiber BID, gas ex PRN, avoid trigger foods, good water intake  Present:  Feels stooling is improved with fiber, can have up to 4 stools per day but may not have any the next day. Still ranging from diarrhea to constipation but feels much improved since previously. She had some belly pain about 4-5 days ago but this has improved.   She has a lesion in her rectal area that came up over the weekend with some pain and itching, no bleeding. Denies rectal bleeding or melena. She tried a homeopathic diaper cream but has not had any improvement. Denies straining more recently. She has had some issues with hemorrhoids before, but usually no significant issues from this.  CT A/P 09/01/22: -Left-sided colonic wall thickening most impressive in the sigmoid with mild surrounding pericolonic edema likely representing ischemic colitis, less likely  infection. -Normal terminal ileum -Nonspecific caudate lobe enlargement -Fat-containing ventral pelvic hernia -Normal pancreas and spleen   Colonoscopy: reportedly prior to 2013. Performed at [facility]? - told she had adhesions to her bowel from tubal ligation.   EGD: reportedly > 10-11 years ago.   Recommendations:    Past Medical History:  Diagnosis Date   2nd degree AV block 06/22/2017   Arthritis    Asthma    Bone spur    Breast cancer, left breast (HCC)    2012   Breast cancer, right breast (HCC)    2021   Chronic bronchitis (HCC)    COPD (chronic obstructive pulmonary disease) (HCC)    Diabetes mellitus without complication (HCC)    Phreesia 04/08/2020   Diastolic heart failure (HCC)    Essential hypertension    Fatty liver 2013   GERD (gastroesophageal reflux disease)    HSV (herpes simplex virus) infection    Hyperlipidemia    IBS (irritable bowel syndrome)    Lung nodule seen on imaging study 2013   Lymphedema    Left arm   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    Osteoarthritis    both knees, lower back, both shoulders; bone spurs to feet   Rectocele 06/25/2013   Posterior repair 11/06/13    Type II diabetes mellitus (HCC)    Ventral hernia     Past Surgical History:  Procedure Laterality Date  BREAST BIOPSY Left 04/2011   BREAST BIOPSY Left 06/22/13   BREAST BIOPSY Left 09/12/2013   Procedure: BREAST BIOPSY WITH NEEDLE LOCALIZATION;  Surgeon: Dalia Heading, MD;  Location: [facility];  Service: General;  Laterality: Left;   BREAST LUMPECTOMY Left 04/2011 X 2   BREAST SURGERY N/A    Phreesia 01/17/2020   CATARACT EXTRACTION W/ INTRAOCULAR LENS  IMPLANT, BILATERAL Bilateral    CATARACT EXTRACTION, BILATERAL     on different occassions    CESAREAN SECTION  1973   COLON SURGERY N/A    2014   DILATION AND CURETTAGE OF UTERUS  11/06/2013   HAND LIGAMENT RECONSTRUCTION Right ~ 2010   HYSTEROSCOPY WITH D & C N/A 11/06/2013   Procedure: DILATATION AND  CURETTAGE /HYSTEROSCOPY;  Surgeon: Tilda Burrow, MD;  Location: [facility];  Service: Gynecology;  Laterality: N/A;   INSERT / REPLACE / REMOVE PACEMAKER  06/22/2017   LUNG LOBECTOMY Right 1988   Fungal Infection   PACEMAKER IMPLANT N/A 06/22/2017   Procedure: PACEMAKER IMPLANT;  Surgeon: Duke Salvia, MD;  Location: [facility] INVASIVE CV LAB;  Service: Cardiovascular;  Laterality: N/A;   PARTIAL MASTECTOMY WITH NEEDLE LOCALIZATION Right 01/02/2020   Procedure: RIGHT PARTIAL MASTECTOMY AFTER NEEDLE LOCALIZATION;  Surgeon: Franky Macho, MD;  Location: [facility];  Service: General;  Laterality: Right;   POLYPECTOMY N/A 11/06/2013   Procedure: POLYPECTOMY (REMOVAL ENDOMETRIAL POLYP);  Surgeon: Tilda Burrow, MD;  Location: [facility];  Service: Gynecology;  Laterality: N/A;   RECTOCELE REPAIR N/A 11/06/2013   Procedure: POSTERIOR REPAIR (RECTOCELE);  Surgeon: Tilda Burrow, MD;  Location: [facility];  Service: Gynecology;  Laterality: N/A;   SHOULDER ARTHROSCOPY WITH OPEN ROTATOR CUFF REPAIR Left 07/26/2012   TONSILLECTOMY  1949   TUBAL LIGATION      Current Outpatient Medications  Medication Sig Dispense Refill   acetaminophen (TYLENOL) 500 MG tablet Take 1,000 mg by mouth every 6 (six) hours as needed for moderate pain.     albuterol (VENTOLIN HFA) 108 (90 Base) MCG/ACT inhaler Inhale 2 puffs into the lungs every 6 (six) hours as needed for wheezing or shortness of breath. 8 g 0   Aloe Vera Leaf POWD Take 800 mg by mouth 2 (two) times daily.     Ascorbic Acid (VITAMIN C) 1000 MG tablet Take 1,000 mg by mouth 2 (two) times daily.     b complex vitamins tablet Take 1 tablet by mouth 2 (two) times daily.      Blood Glucose Monitoring Suppl (BLOOD GLUCOSE SYSTEM PAK) KIT Use as directed to monitor FSBS 1x daily. Dx: E11.9. Please dispense as Accu-Chek Aviva 1 each 1   Calcium Polycarbophil (FIBER-CAPS PO) Take by mouth daily at 6 (six) AM. 3 qam and Two po QHS     Cholecalciferol (VITAMIN D3) 2000 UNITS TABS Take  2,000 Units by mouth 2 (two) times daily.     clotrimazole-betamethasone (LOTRISONE) cream Apply 1 Application topically daily. 30 g 0   dapagliflozin propanediol (FARXIGA) 10 MG TABS tablet Take 1 tablet (10 mg total) by mouth daily before breakfast. 90 tablet 3   diclofenac Sodium (VOLTAREN) 1 % GEL Apply 2 g topically 4 (four) times daily. (Patient taking differently: Apply 2 g topically 4 (four) times daily. prn) 100 g 1   fluocinonide cream (LIDEX) 0.05 % Apply 1 Application topically 2 (two) times daily as needed (poison ivy/bites/hives). 30 g 11   Ginkgo Biloba 120 MG CAPS Take 120 mg by mouth  in the morning and at bedtime.      glipiZIDE (GLUCOTROL XL) 5 MG 24 hr tablet Take 1 tablet by mouth once daily with breakfast 90 tablet 0   Glucose Blood (BLOOD GLUCOSE TEST STRIPS) STRP Use as directed to monitor FSBS 1x daily. Dx: E11.9. Please dispense as Accu-Chek Aviva 100 each 1   KRILL OIL PO Take 1,500 mg by mouth 2 (two) times daily.     Lancets MISC Use as directed to monitor FSBS 1x daily. Dx: E11.9. Please dispense as Accu-Chek Aviva 100 each 1   loperamide (IMODIUM A-D) 2 MG tablet Take 1 tablet (2 mg total) by mouth 4 (four) times daily as needed for diarrhea or loose stools. 30 tablet 1   metoprolol succinate (TOPROL-XL) 25 MG 24 hr tablet Take 1/2 (one-half) tablet by mouth once daily 45 tablet 2   Multiple Vitamin (MULTIVITAMIN WITH MINERALS) TABS tablet Take 1 tablet by mouth daily.     naftifine (NAFTIN) 1 % cream Apply topically 2 (two) times daily as needed. 60 g 2   rosuvastatin (CRESTOR) 5 MG tablet TAKE 1 TABLET BY MOUTH ONCE DAILY FOR CHOLESTEROL 90 tablet 0   Semaglutide, 1 MG/DOSE, 4 MG/3ML SOPN Inject 1 mg as directed once a week. 9 mL 1   simethicone (MYLICON) 80 MG chewable tablet Chew 80 mg by mouth as needed for flatulence.      tiZANidine (ZANAFLEX) 4 MG tablet Take 1 tablet (4 mg total) by mouth every 8 (eight) hours as needed for muscle spasms. 90 tablet 1    torsemide (DEMADEX) 20 MG tablet Take 1 tablet (20 mg total) by mouth 3 (three) times a week. (Patient taking differently: Take 10 mg by mouth 3 (three) times a week. 10 mg about three times a week) 40 tablet 3   UNABLE TO FIND Please dispense custom molded foot orthotics and heel spurs Dx: E11.30 1 each 0   No current facility-administered medications for this visit.    Allergies as of 12/13/2022 - Review Complete 12/13/2022  Allergen Reaction Noted   Arimidex [anastrozole] Other (See Comments) 04/18/2013   Other Hives and Itching 01/17/2020   Adhesive [tape] Other (See Comments) 10/08/2011   Lisinopril  06/15/2012   Tetanus toxoids  04/25/2018   Tetracyclines & related Other (See Comments) 10/08/2011    Family History  Problem Relation Age of Onset   Hypertension Mother    Heart disease Mother    Diabetes Mother    Kidney disease Mother        ESRD   Cancer Mother        Bladder   Hypertension Father    Hyperlipidemia Father    Heart disease Father    Diabetes Father    Cancer Father        Stomach Cancer   Diabetes Sister    Heart disease Sister    Alzheimer's disease Paternal Aunt    Stroke Maternal Grandfather    Cancer Paternal Grandfather        stomach   Cancer Maternal Uncle        prostate   Cancer Paternal Uncle        stomach    Social History   Socioeconomic History   Marital status: Married    Spouse name: Kandee Keen   Number of children: 2   Years of education: 12   Highest education level: Some college, no degree  Occupational History   Occupation: retired     Comment: budget  analyst for [employer]  Tobacco Use   Smoking status: Never   Smokeless tobacco: Never  Vaping Use   Vaping status: Never Used  Substance and Sexual Activity   Alcohol use: Not Currently    Comment: Twice a year   Drug use: No   Sexual activity: Not Currently    Birth control/protection: Surgical  Other Topics Concern   Not on file  Social History Narrative    Not on file   Social Determinants of Health   Financial Resource Strain: Low Risk  (04/26/2022)   Overall Financial Resource Strain (CARDIA)    Difficulty of Paying Living Expenses: Not very hard  Food Insecurity: Low Risk  (07/28/2022)   [facility]   Food vital sign    Within the past 12 months, you worried that your food would run out before you got money to buy more: Never true    Within the past 12 months, the food you bought just didn't last and you didn't have money to get more. : Never true  Transportation Needs: No Transportation Needs (07/28/2022)   [facility]   Transportation    In the past 12 months, has lack of reliable transportation kept you from medical appointments, meetings, work or from getting things needed for daily living? : No  Physical Activity: Inactive (04/26/2022)   Exercise Vital Sign    Days of Exercise per Week: 0 days    Minutes of Exercise per Session: 0 min  Stress: No Stress Concern Present (04/26/2022)   [employer] of Occupational Health - Occupational Stress Questionnaire    Feeling of Stress : Only a little  Social Connections: Moderately Integrated (04/26/2022)   Social Connection and Isolation Panel [NHANES]    Frequency of Communication with Friends and Family: Once a week    Frequency of Social Gatherings with Friends and Family: Once a week    Attends Religious Services: More than 4 times per year    Active Member of [employer] or Organizations: Yes    Attends Engineer, structural: More than 4 times per year    Marital Status: Married    Review of systems General: negative for malaise, night sweats, fever, chills, weight loss Neck: Negative for lumps, goiter, pain and significant neck swelling Resp: Negative for cough, wheezing, dyspnea at rest CV: Negative for chest pain, leg swelling, palpitations, orthopnea GI: denies melena, hematochezia, nausea, vomiting, diarrhea,  constipation, dysphagia, odyonophagia, early satiety or unintentional weight loss. +painful/pruritic lesion in rectal area  MSK: Negative for joint pain or swelling, back pain, and muscle pain. Derm: Negative for itching or rash Psych: Denies depression, anxiety, memory loss, confusion. No homicidal or suicidal ideation.  Heme: Negative for prolonged bleeding, bruising easily, and swollen nodes. Endocrine: Negative for cold or heat intolerance, polyuria, polydipsia and goiter. Neuro: negative for tremor, gait imbalance, syncope and seizures. The remainder of the review of systems is noncontributory.  Physical Exam: BP 128/80 (BP Location: Right Arm, Patient Position: Sitting, Cuff Size: Large)   Pulse 75   Temp 98.1 F (36.7 C) (Oral)   Ht 5\' 5"  (1.651 m)   Wt 218 lb 1.6 oz (98.9 kg)   BMI 36.29 kg/m  General:   Alert and oriented. No distress noted. Pleasant and cooperative.  Head:  Normocephalic and atraumatic. Eyes:  Conjuctiva clear without scleral icterus. Mouth:  Oral mucosa pink and moist. Good dentition. No lesions. Heart: Normal rate and rhythm,  s1 and s2 heart sounds present.  Lungs: Clear lung sounds in all lobes. Respirations equal and unlabored. Abdomen:  +BS, soft, non-tender and non-distended. No rebound or guarding. No HSM or masses noted. Rectal: wendy richards LPN present as witness, very small single vesicle noted just proximal to anal opening, no blood or drainage noted. No DRE done.  Derm: No palmar erythema or jaundice Msk:  Symmetrical without gross deformities. Normal posture. Extremities:  Without edema. Neurologic:  Alert and  oriented x4 Psych:  Alert and cooperative. Normal mood and affect.  Invalid input(s): "6 MONTHS"   ASSESSMENT: Julie Clay is a 82 y.o. female presenting today for follow up of IBS and with a new rectal lesion  IBS: doing better on fiber supplements, still ranges from diarrhea to constipation though symptoms have improved quite  a bit with fiber. Encouraged to increase water intake, continue with fiber  Perianal lesion: painful/pruritic, very small, single, vesicular lesion just proximal to the anal opening. No obvious masses, no drainage or bleeding. Patient has history or HSV, though reports no outbreak in quite some time. suspect this is a herpes lesion given her history. Will treat with topical acyclovir 6x/day for 7 days. She should let me know if symptoms are not improving.    PLAN:  Continue with fiber 2. Good water intake  3.Rx acyclovir 6x/day x7 days 4. Pt to make me aware if lesions not improving with antiviral therapy  All questions were answered, patient verbalized understanding and is in agreement with plan as outlined above.   Follow Up: 3 months   Joliet Mallozzi L. Jeanmarie Hubert, MSN, APRN, AGNP-C Adult-Gerontology Nurse Practitioner [facility]  I have reviewed the note and agree with the APP's assessment as described in this progress note  Katrinka Blazing, MD Gastroenterology and Hepatology [facility] Gastroenterology

## 2022-12-16 ENCOUNTER — Ambulatory Visit
Admission: EM | Admit: 2022-12-16 | Discharge: 2022-12-16 | Disposition: A | Discharge status: Home / Self Care | Payer: Medicare PPO | Attending: Family Medicine | Admitting: Family Medicine

## 2022-12-16 DIAGNOSIS — N390 Urinary tract infection, site not specified: Secondary | ICD-10-CM

## 2022-12-16 LAB — POCT URINALYSIS DIP (MANUAL ENTRY)
Bilirubin, UA: NEGATIVE
Glucose, UA: >=1000 mg/dL — AB
Ketones, POC UA: NEGATIVE mg/dL
Nitrite, UA: POSITIVE — AB
Protein Ur, POC: NEGATIVE mg/dL
Spec Grav, UA: 1.015 (ref 1.010–1.025)
Urobilinogen, UA: 0.2 E.U./dL
pH, UA: 7 (ref 5.0–8.0)

## 2022-12-16 MED ORDER — CEPHALEXIN 500 MG PO CAPS: 500.0000 mg | ORAL_CAPSULE | Freq: Two times a day (BID) | ORAL | 0 refills | Status: DC

## 2022-12-16 NOTE — ED Triage Notes (Signed)
Pt reports she has painful urination x 2 days.    Drank cranberry

## 2022-12-16 NOTE — ED Provider Notes (Signed)
[facility]    CSN: [id number] Arrival date & time: 12/16/22  1320      History   Chief Complaint No chief complaint on file.   HPI Julie Clay is a 82 y.o. female.   Patient presenting today with 2-day history of dysuria, suprapubic pressure.  Denies fever, chills, hematuria, nausea, vomiting, back pain.  So far trying cranberry and fluids with no relief.    Past Medical History:  Diagnosis Date   2nd degree AV block 06/22/2017   Arthritis    Asthma    Bone spur    Breast cancer, left breast (HCC)    2012   Breast cancer, right breast (HCC)    2021   Chronic bronchitis (HCC)    COPD (chronic obstructive pulmonary disease) (HCC)    Diabetes mellitus without complication (HCC)    Phreesia 04/08/2020   Diastolic heart failure (HCC)    Essential hypertension    Fatty liver 2013   GERD (gastroesophageal reflux disease)    HSV (herpes simplex virus) infection    Hyperlipidemia    IBS (irritable bowel syndrome)    Lung nodule seen on imaging study 2013   Lymphedema    Left arm   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   Mixed simple and mucopurulent chronic bronchitis (HCC) 09/03/2022   Encounter for examination following treatment at hospital 09/03/2022   Encounter for general adult medical examination with abnormal findings 06/25/2022   Acute non-recurrent frontal sinusitis 06/25/2022   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   Poison ivy dermatitis 06/25/2022   Intertrigo 06/25/2022   Muscle strain 03/29/2022   Actinic keratoses 12/23/2021   Irritable bowel syndrome with both constipation and diarrhea  09/01/2021   Gastroesophageal reflux disease 05/20/2021   (HFpEF) heart failure with preserved ejection fraction (HCC) 05/20/2021   GAD (generalized anxiety disorder) 05/20/2021   Diarrhea 04/23/2021   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 (HCC)    Pacemaker 07/13/2017   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/2017   Primary osteoarthritis of first carpometacarpal joint of left hand 06/12/2015   Fatty liver disease, nonalcoholic 05/23/2015   Lymphedema 02/18/2015   Ganglion cyst of flexor tendon sheath of finger of left hand 08/02/2014   Atypical ductal hyperplasia of left breast 07/12/2013   Endometrial polyp 06/25/2013   Diabetic neuropathy (HCC) 05/09/2013   Bladder prolapse, female, acquired 05/09/2013   Pain, joint, multiple sites 03/26/2013   Insomnia 11/29/2012   Rotator cuff syndrome of left shoulder 06/21/2012   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   Essential hypertension, benign 10/11/2011   DM (diabetes mellitus) (HCC) 10/11/2011   OA (osteoarthritis) 10/11/2011   Class 2 obesity 10/11/2011   Asthma with acute exacerbation 10/11/2011   Hyperlipidemia 10/11/2011   Chronic fatigue 10/11/2011   OSA (obstructive sleep apnea) 10/11/2011    Past Surgical History:  Procedure Laterality Date   BREAST BIOPSY Left 04/2011   BREAST BIOPSY Left 06/22/13   BREAST BIOPSY Left 09/12/2013   Procedure: BREAST BIOPSY WITH  NEEDLE LOCALIZATION;  Surgeon: Dalia Heading, MD;  Location: [facility];  Service: General;  Laterality: Left;   BREAST LUMPECTOMY Left 04/2011 X 2   BREAST SURGERY N/A    Phreesia 01/17/2020   CATARACT EXTRACTION W/ INTRAOCULAR LENS  IMPLANT, BILATERAL Bilateral    CATARACT EXTRACTION, BILATERAL     on different occassions    CESAREAN SECTION  1973   COLON SURGERY N/A    2014   DILATION AND CURETTAGE OF UTERUS  11/06/2013   HAND LIGAMENT RECONSTRUCTION Right ~ 2010   HYSTEROSCOPY WITH D & C N/A 11/06/2013    Procedure: DILATATION AND CURETTAGE /HYSTEROSCOPY;  Surgeon: Tilda Burrow, MD;  Location: [facility];  Service: Gynecology;  Laterality: N/A;   INSERT / REPLACE / REMOVE PACEMAKER  06/22/2017   LUNG LOBECTOMY Right 1988   Fungal Infection   PACEMAKER IMPLANT N/A 06/22/2017   Procedure: PACEMAKER IMPLANT;  Surgeon: Duke Salvia, MD;  Location: [facility];  Service: Cardiovascular;  Laterality: N/A;   PARTIAL MASTECTOMY WITH NEEDLE LOCALIZATION Right 01/02/2020   Procedure: RIGHT PARTIAL MASTECTOMY AFTER NEEDLE LOCALIZATION;  Surgeon: Franky Macho, MD;  Location: [facility];  Service: General;  Laterality: Right;   POLYPECTOMY N/A 11/06/2013   Procedure: POLYPECTOMY (REMOVAL ENDOMETRIAL POLYP);  Surgeon: Tilda Burrow, MD;  Location: [facility];  Service: Gynecology;  Laterality: N/A;   RECTOCELE REPAIR N/A 11/06/2013   Procedure: POSTERIOR REPAIR (RECTOCELE);  Surgeon: Tilda Burrow, MD;  Location: [facility];  Service: Gynecology;  Laterality: N/A;   SHOULDER ARTHROSCOPY WITH OPEN ROTATOR CUFF REPAIR Left 07/26/2012   TONSILLECTOMY  1949   TUBAL LIGATION      OB History     Gravida  2   Para  2   Term      Preterm      AB      Living  2      SAB      IAB      Ectopic      Multiple      Live Births  2            Home Medications    Prior to Admission medications   Medication Sig Start Date End Date Taking? Authorizing Provider  cephALEXin (KEFLEX) 500 MG capsule Take 1 capsule (500 mg total) by mouth 2 (two) times daily. 12/16/22  Yes Particia Nearing, PA-C  acetaminophen (TYLENOL) 500 MG tablet Take 1,000 mg by mouth every 6 (six) hours as needed for moderate pain.    [provider]  acyclovir ointment (ZOVIRAX) 5 % Apply small amount of ointment to rectal lesion 6 times per day x7 days 12/13/22   Carlan, Leeroy Bock L, NP  albuterol (VENTOLIN HFA) 108 (90 Base) MCG/ACT inhaler Inhale 2 puffs into the lungs every 6 (six) hours as needed for wheezing or  shortness of breath. 04/09/20   Salley Scarlet, MD  Aloe Vera Leaf POWD Take 800 mg by mouth 2 (two) times daily.    [provider]  Ascorbic Acid (VITAMIN C) 1000 MG tablet Take 1,000 mg by mouth 2 (two) times daily.    [provider]  b complex vitamins tablet Take 1 tablet by mouth 2 (two) times daily.     [provider]  Blood Glucose Monitoring Suppl (BLOOD GLUCOSE SYSTEM PAK) KIT Use as directed to monitor FSBS 1x daily. Dx: E11.9. Please dispense as Accu-Chek Aviva 01/18/18   Salley Scarlet, MD  Calcium Polycarbophil (FIBER-CAPS PO) Take by mouth daily at 6 (six) AM. 3 qam and Two po QHS    [provider]  Cholecalciferol (VITAMIN D3) 2000 UNITS TABS Take 2,000 Units by mouth 2 (two) times daily.    [provider]  clotrimazole-betamethasone (LOTRISONE) cream Apply 1 Application topically daily. 06/25/22   Anabel Halon, MD  dapagliflozin propanediol (FARXIGA) 10 MG TABS tablet Take 1 tablet (10 mg total) by mouth daily before breakfast. 06/18/22   Anabel Halon, MD  diclofenac Sodium (VOLTAREN) 1 % GEL Apply 2 g topically 4 (four) times daily. Patient taking differently: Apply 2 g topically 4 (four) times daily. prn 11/19/20   Heather Roberts, NP  fluocinonide cream (LIDEX) 0.05 % Apply 1 Application topically 2 (two) times daily as needed (poison ivy/bites/hives). 06/25/22   Anabel Halon, MD  Ginkgo Biloba 120 MG CAPS Take 120 mg by mouth in the morning and at bedtime.     [provider]  glipiZIDE (GLUCOTROL XL) 5 MG 24 hr tablet Take 1 tablet by mouth once daily with breakfast 11/02/22   Anabel Halon, MD  Glucose Blood (BLOOD GLUCOSE TEST STRIPS) STRP Use as directed to monitor FSBS 1x daily. Dx: E11.9. Please dispense as Accu-Chek Aviva 01/18/18   [place], Velna Hatchet, MD  KRILL OIL PO Take 1,500 mg by mouth 2 (two) times daily.    [provider]  Lancets MISC Use as directed to monitor FSBS 1x daily. Dx: E11.9. Please  dispense as Accu-Chek Aviva 01/18/18   Knapp, Velna Hatchet, MD  loperamide (IMODIUM A-D) 2 MG tablet Take 1 tablet (2 mg total) by mouth 4 (four) times daily as needed for diarrhea or loose stools. 04/23/21   Donell Beers, FNP  metoprolol succinate (TOPROL-XL) 25 MG 24 hr tablet Take 1/2 (one-half) tablet by mouth once daily 06/23/22   [place] Ouch, [place] M, PA-C  Multiple Vitamin (MULTIVITAMIN WITH MINERALS) TABS tablet Take 1 tablet by mouth daily.    [provider]  naftifine (NAFTIN) 1 % cream Apply topically 2 (two) times daily as needed. 12/04/19   Rosalie, Velna Hatchet, MD  rosuvastatin (CRESTOR) 5 MG tablet TAKE 1 TABLET BY MOUTH ONCE DAILY FOR CHOLESTEROL 10/25/22   Anabel Halon, MD  Semaglutide, 1 MG/DOSE, 4 MG/3ML SOPN Inject 1 mg as directed once a week. 06/21/22   Anabel Halon, MD  simethicone (MYLICON) 80 MG chewable tablet Chew 80 mg by mouth as needed for flatulence.     [provider]  tiZANidine (ZANAFLEX) 4 MG tablet Take 1 tablet (4 mg total) by mouth every 8 (eight) hours as needed for muscle spasms. 08/03/22   Anabel Halon, MD  torsemide (DEMADEX) 20 MG tablet Take 1 tablet (20 mg total) by mouth 3 (three) times a week. Patient taking differently: Take 10 mg by mouth 3 (three) times a week. 10 mg about three times a week 07/31/21   Iran Ouch, Lennart Pall, PA-C  UNABLE TO FIND Please dispense custom molded foot orthotics and heel spurs Dx: E11.30 09/30/20   Heather Roberts, NP    Family History Family History  Problem Relation Age of Onset   Hypertension Mother    Heart disease Mother    Diabetes Mother    Kidney disease Mother        ESRD   Cancer Mother        Bladder   Hypertension Father    Hyperlipidemia Father  Heart disease Father    Diabetes Father    Cancer Father        Stomach Cancer   Diabetes Sister    Heart disease Sister    Alzheimer's disease Paternal Aunt    Stroke Maternal Grandfather    Cancer Paternal Grandfather        stomach    Cancer Maternal Uncle        prostate   Cancer Paternal Uncle        stomach    Social History Social History   Tobacco Use   Smoking status: Never   Smokeless tobacco: Never  Vaping Use   Vaping status: Never Used  Substance Use Topics   Alcohol use: Not Currently    Comment: Twice a year   Drug use: No     Allergies   Arimidex [anastrozole], Other, Adhesive [tape], Lisinopril, Tetanus toxoids, and Tetracyclines & related   Review of Systems Review of Systems Per HPI  Physical Exam Triage Vital Signs ED Triage Vitals  Encounter Vitals Group     BP 12/16/22 1339 (!) 170/75     Systolic BP Percentile --      Diastolic BP Percentile --      Pulse Rate 12/16/22 1339 92     Resp 12/16/22 1339 20     Temp 12/16/22 1339 98.5 F (36.9 C)     Temp Source 12/16/22 1339 Oral     SpO2 12/16/22 1339 95 %     Weight --      Height --      Head Circumference --      Peak Flow --      Pain Score 12/16/22 1340 4     Pain Loc --      Pain Education --      Exclude from Growth Chart --    No data found.  Updated Vital Signs BP (!) 170/75 (BP Location: Right Arm)   Pulse 92   Temp 98.5 F (36.9 C) (Oral)   Resp 20   SpO2 95%   Visual Acuity Right Eye Distance:   Left Eye Distance:   Bilateral Distance:    Right Eye Near:   Left Eye Near:    Bilateral Near:     Physical Exam Vitals and nursing note reviewed.  Constitutional:      Appearance: Normal appearance. She is not ill-appearing.  HENT:     Head: Atraumatic.  Eyes:     Extraocular Movements: Extraocular movements intact.     Conjunctiva/sclera: Conjunctivae normal.  Cardiovascular:     Rate and Rhythm: Normal rate and regular rhythm.     Heart sounds: Normal heart sounds.  Pulmonary:     Effort: Pulmonary effort is normal.     Breath sounds: Normal breath sounds.  Abdominal:     General: Bowel sounds are normal. There is no distension.     Palpations: Abdomen is soft.     Tenderness: There  is no abdominal tenderness. There is no right CVA tenderness, left CVA tenderness or guarding.  Musculoskeletal:        General: Normal range of motion.     Cervical back: Normal range of motion and neck supple.  Skin:    General: Skin is warm and dry.  Neurological:     Mental Status: She is alert and oriented to person, place, and time.  Psychiatric:        Mood and Affect: Mood normal.  Thought Content: Thought content normal.        Judgment: Judgment normal.      UC Treatments / Results  Labs (all labs ordered are listed, but only abnormal results are displayed) Labs Reviewed  POCT URINALYSIS DIP (MANUAL ENTRY) - Abnormal; Notable for the following components:      Result Value   Clarity, UA hazy (*)    Glucose, UA >=1,000 (*)    Blood, UA trace-lysed (*)    Nitrite, UA Positive (*)    Leukocytes, UA Trace (*)    All other components within normal limits  URINE CULTURE    EKG   Radiology No results found.  Procedures Procedures (including critical care time)  Medications Ordered in UC Medications - No data to display  Initial Impression / Assessment and Plan / UC Course  I have reviewed the triage vital signs and the nursing notes.  Pertinent labs & imaging results that were available during my care of the patient were reviewed by me and considered in my medical decision making (see chart for details).     Urinalysis with evidence of urinary tract infection.  Treat with Keflex, await urine culture and adjust if needed.  Discussed fluids, cranberry, Tylenol as needed.  Return for worsening symptoms.  Final Clinical Impressions(s) / UC Diagnoses   Final diagnoses:  Acute lower UTI   Discharge Instructions   None    ED Prescriptions     Medication Sig Dispense Auth. Provider   cephALEXin (KEFLEX) 500 MG capsule Take 1 capsule (500 mg total) by mouth 2 (two) times daily. 10 capsule Particia Nearing, [place]      PDMP not reviewed this  encounter.   Particia Nearing, [place] 12/16/22 1410

## 2022-12-19 LAB — URINE CULTURE: Culture: >=100000 — AB

## 2022-12-21 ENCOUNTER — Other Ambulatory Visit: Payer: Self-pay | Admitting: Internal Medicine

## 2022-12-21 ENCOUNTER — Telehealth: Payer: Self-pay | Admitting: Internal Medicine

## 2022-12-21 DIAGNOSIS — I10 Essential (primary) hypertension: Secondary | ICD-10-CM

## 2022-12-21 DIAGNOSIS — I5032 Chronic diastolic (congestive) heart failure: Secondary | ICD-10-CM

## 2022-12-21 DIAGNOSIS — E1143 Type 2 diabetes mellitus with diabetic autonomic (poly)neuropathy: Secondary | ICD-10-CM

## 2022-12-21 DIAGNOSIS — N3000 Acute cystitis without hematuria: Secondary | ICD-10-CM

## 2022-12-21 NOTE — Telephone Encounter (Signed)
Patient advised, they will come friday

## 2022-12-21 NOTE — Telephone Encounter (Signed)
Pt here to get labs before next appt 9/11 but no labs were in- also requesting a urine specimen since she just got off of antibiotics for UTI. Says they will come back by Fri morning. Please advise Thank you

## 2022-12-22 ENCOUNTER — Ambulatory Visit (INDEPENDENT_AMBULATORY_CARE_PROVIDER_SITE_OTHER): Payer: Medicare PPO

## 2022-12-22 DIAGNOSIS — I441 Atrioventricular block, second degree: Secondary | ICD-10-CM | POA: Diagnosis not present

## 2022-12-23 LAB — CUP PACEART REMOTE DEVICE CHECK
Battery Remaining Longevity: 42 mo
Battery Remaining Percentage: 42 %
Battery Voltage: 2.98 V
Brady Statistic AP VP Percent: 5.6 %
Brady Statistic AP VS Percent: 1 %
Brady Statistic AS VP Percent: 94 %
Brady Statistic AS VS Percent: 1 %
Brady Statistic RA Percent Paced: 5.4 %
Brady Statistic RV Percent Paced: 99 %
Date Time Interrogation Session: 20240904020018
Implantable Lead Connection Status: 753985
Implantable Lead Connection Status: 753985
Implantable Lead Implant Date: 20190306
Implantable Lead Implant Date: 20190306
Implantable Lead Location: 753859
Implantable Lead Location: 753860
Implantable Lead Model: 5076
Implantable Lead Model: 5076
Implantable Pulse Generator Implant Date: 20190306
Lead Channel Impedance Value: 350 Ohm
Lead Channel Impedance Value: 440 Ohm
Lead Channel Pacing Threshold Amplitude: 0.5 V
Lead Channel Pacing Threshold Amplitude: 0.75 V
Lead Channel Pacing Threshold Pulse Width: 0.5 ms
Lead Channel Pacing Threshold Pulse Width: 0.5 ms
Lead Channel Sensing Intrinsic Amplitude: 3.6 mV
Lead Channel Sensing Intrinsic Amplitude: 6.8 mV
Lead Channel Setting Pacing Amplitude: 2 V
Lead Channel Setting Pacing Amplitude: 2.5 V
Lead Channel Setting Pacing Pulse Width: 0.5 ms
Lead Channel Setting Sensing Sensitivity: 4 mV
Pulse Gen Model: 2272
Pulse Gen Serial Number: 9001027

## 2022-12-24 DIAGNOSIS — N3000 Acute cystitis without hematuria: Secondary | ICD-10-CM | POA: Diagnosis not present

## 2022-12-24 DIAGNOSIS — E1143 Type 2 diabetes mellitus with diabetic autonomic (poly)neuropathy: Secondary | ICD-10-CM | POA: Diagnosis not present

## 2022-12-24 DIAGNOSIS — I5032 Chronic diastolic (congestive) heart failure: Secondary | ICD-10-CM | POA: Diagnosis not present

## 2022-12-24 DIAGNOSIS — I10 Essential (primary) hypertension: Secondary | ICD-10-CM | POA: Diagnosis not present

## 2022-12-27 LAB — CBC WITH DIFFERENTIAL/PLATELET
Basophils Absolute: 0 10*3/uL (ref 0.0–0.2)
Basos: 0 %
EOS (ABSOLUTE): 0.2 10*3/uL (ref 0.0–0.4)
Eos: 2 %
Hematocrit: 48.5 % — ABNORMAL HIGH (ref 34.0–46.6)
Hemoglobin: 15.1 g/dL (ref 11.1–15.9)
Immature Grans (Abs): 0 10*3/uL (ref 0.0–0.1)
Immature Granulocytes: 0 %
Lymphocytes Absolute: 3.1 10*3/uL (ref 0.7–3.1)
Lymphs: 34 %
MCH: 27.9 pg (ref 26.6–33.0)
MCHC: 31.1 g/dL — ABNORMAL LOW (ref 31.5–35.7)
MCV: 90 fL (ref 79–97)
Monocytes Absolute: 0.8 10*3/uL (ref 0.1–0.9)
Monocytes: 9 %
Neutrophils Absolute: 4.9 10*3/uL (ref 1.4–7.0)
Neutrophils: 55 %
Platelets: 255 10*3/uL (ref 150–450)
RBC: 5.41 x10E6/uL — ABNORMAL HIGH (ref 3.77–5.28)
RDW: 12.9 % (ref 11.7–15.4)
WBC: 9.1 10*3/uL (ref 3.4–10.8)

## 2022-12-27 LAB — CMP14+EGFR
ALT: 14 IU/L (ref 0–32)
AST: 19 IU/L (ref 0–40)
Albumin: 4.4 g/dL (ref 3.8–4.8)
Alkaline Phosphatase: 69 IU/L (ref 44–121)
BUN/Creatinine Ratio: 18 (ref 12–28)
BUN: 17 mg/dL (ref 8–27)
Bilirubin Total: 0.5 mg/dL (ref 0.0–1.2)
CO2: 23 mmol/L (ref 20–29)
Calcium: 9.9 mg/dL (ref 8.7–10.3)
Chloride: 104 mmol/L (ref 96–106)
Creatinine, Ser: 0.94 mg/dL (ref 0.57–1.00)
Globulin, Total: 2.2 g/dL (ref 1.5–4.5)
Glucose: 103 mg/dL — ABNORMAL HIGH (ref 70–99)
Potassium: 4.5 mmol/L (ref 3.5–5.2)
Sodium: 143 mmol/L (ref 134–144)
Total Protein: 6.6 g/dL (ref 6.0–8.5)
eGFR: 61 mL/min/{1.73_m2} (ref >59)

## 2022-12-27 LAB — UA/M W/RFLX CULTURE, ROUTINE
Bilirubin, UA: NEGATIVE
Ketones, UA: NEGATIVE
Nitrite, UA: NEGATIVE
Protein,UA: NEGATIVE
RBC, UA: NEGATIVE
Specific Gravity, UA: 1.021 (ref 1.005–1.030)
Urobilinogen, Ur: 0.2 mg/dL (ref 0.2–1.0)
pH, UA: 6.5 (ref 5.0–7.5)

## 2022-12-27 LAB — MICROSCOPIC EXAMINATION
Bacteria, UA: NONE SEEN
Casts: NONE SEEN /LPF
WBC, UA: >30 /HPF — AB (ref 0–5)

## 2022-12-27 LAB — HEMOGLOBIN A1C
Est. average glucose Bld gHb Est-mCnc: 137 mg/dL
Hgb A1c MFr Bld: 6.4 % — ABNORMAL HIGH (ref 4.8–5.6)

## 2022-12-27 LAB — URINE CULTURE, REFLEX

## 2022-12-29 ENCOUNTER — Ambulatory Visit (INDEPENDENT_AMBULATORY_CARE_PROVIDER_SITE_OTHER): Payer: Medicare PPO | Admitting: Internal Medicine

## 2022-12-29 ENCOUNTER — Encounter: Payer: Self-pay | Admitting: Internal Medicine

## 2022-12-29 VITALS — BP 139/65 | HR 83 | Resp 16 | Ht 65.0 in | Wt 218.8 lb

## 2022-12-29 DIAGNOSIS — Z7985 Long-term (current) use of injectable non-insulin antidiabetic drugs: Secondary | ICD-10-CM

## 2022-12-29 DIAGNOSIS — E1141 Type 2 diabetes mellitus with diabetic mononeuropathy: Secondary | ICD-10-CM | POA: Diagnosis not present

## 2022-12-29 DIAGNOSIS — E782 Mixed hyperlipidemia: Secondary | ICD-10-CM | POA: Diagnosis not present

## 2022-12-29 DIAGNOSIS — Z23 Encounter for immunization: Secondary | ICD-10-CM

## 2022-12-29 DIAGNOSIS — I5032 Chronic diastolic (congestive) heart failure: Secondary | ICD-10-CM | POA: Diagnosis not present

## 2022-12-29 DIAGNOSIS — I10 Essential (primary) hypertension: Secondary | ICD-10-CM | POA: Diagnosis not present

## 2022-12-29 DIAGNOSIS — I441 Atrioventricular block, second degree: Secondary | ICD-10-CM | POA: Diagnosis not present

## 2022-12-29 DIAGNOSIS — K580 Irritable bowel syndrome with diarrhea: Secondary | ICD-10-CM

## 2022-12-29 DIAGNOSIS — Z7984 Long term (current) use of oral hypoglycemic drugs: Secondary | ICD-10-CM

## 2022-12-29 DIAGNOSIS — E1143 Type 2 diabetes mellitus with diabetic autonomic (poly)neuropathy: Secondary | ICD-10-CM | POA: Diagnosis not present

## 2022-12-29 MED ORDER — SEMAGLUTIDE (1 MG/DOSE) 4 MG/3ML ~~LOC~~ SOPN
1.0000 mg | PEN_INJECTOR | SUBCUTANEOUS | 1 refills | Status: DC
Start: 2022-12-29 — End: 2023-06-28

## 2022-12-29 MED ORDER — UNABLE TO FIND
0 refills | Status: DC
Start: 2022-12-29 — End: 2024-02-20

## 2022-12-29 MED ORDER — GLIPIZIDE ER 5 MG PO TB24
5.0000 mg | ORAL_TABLET | Freq: Every day | ORAL | 1 refills | Status: DC
Start: 2022-12-29 — End: 2023-06-28

## 2022-12-29 MED ORDER — SEMAGLUTIDE (1 MG/DOSE) 4 MG/3ML ~~LOC~~ SOPN
1.0000 mg | PEN_INJECTOR | SUBCUTANEOUS | 1 refills | Status: DC
Start: 2022-12-29 — End: 2022-12-29

## 2022-12-29 NOTE — Assessment & Plan Note (Signed)
Prescribed custom molded orthotics

## 2022-12-29 NOTE — Progress Notes (Signed)
Established Patient Office Visit  Subjective:  Patient ID: Julie Clay, female    DOB: Nov 02, 1940  Age: 82 y.o. MRN: [id number]  CC:  Chief Complaint  Patient presents with   Diabetes    Follow up visit    HPI Julie Clay is a 82 y.o. female with past medical history of HTN, asthma, type II DM with neuropathy, GERD, chronic diarrhea, breast ca. and GAD who presents for f/u of her chronic medical conditions.  Type II DM: She has been taking glipizide, Farxiga and Ozempic. Her HbA1c is 6.4 now, stable. Her diarrhea has slightly improved since stopping metformin.  Denies any polyuria or polydipsia currently.  She takes Crestor for HLD.  She has history of Mobitz 2 second-degree heart block, s/p pacemaker placement and HFpEF. BP is well-controlled. Takes medications regularly. Patient denies headache, dizziness, chest pain, dyspnea or palpitations.  IBS-D: She has chronic diarrhea, takes Imodium as needed for diarrhea.  Denies any melena or hematochezia.  Past Medical History:  Diagnosis Date   2nd degree AV block 06/22/2017   Arthritis    Asthma    Bone spur    Breast cancer, left breast (HCC)    2012   Breast cancer, right breast (HCC)    2021   Chronic bronchitis (HCC)    COPD (chronic obstructive pulmonary disease) (HCC)    Diabetes mellitus without complication (HCC)    Phreesia 04/08/2020   Diastolic heart failure (HCC)    Essential hypertension    Fatty liver 2013   GERD (gastroesophageal reflux disease)    HSV (herpes simplex virus) infection    Hyperlipidemia    IBS (irritable bowel syndrome)    Lung nodule seen on imaging study 2013   Lymphedema    Left arm   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    Osteoarthritis    both knees, lower back, both shoulders; bone spurs to feet   Rectocele 06/25/2013   Posterior repair 11/06/13    Type II diabetes mellitus (HCC)    Ventral hernia     Past Surgical History:  Procedure Laterality Date   BREAST BIOPSY  Left 04/2011   BREAST BIOPSY Left 06/22/13   BREAST BIOPSY Left 09/12/2013   Procedure: BREAST BIOPSY WITH NEEDLE LOCALIZATION;  Surgeon: Dalia Heading, MD;  Location: [facility];  Service: General;  Laterality: Left;   BREAST LUMPECTOMY Left 04/2011 X 2   BREAST SURGERY N/A    Phreesia 01/17/2020   CATARACT EXTRACTION W/ INTRAOCULAR LENS  IMPLANT, BILATERAL Bilateral    CATARACT EXTRACTION, BILATERAL     on different occassions    CESAREAN SECTION  1973   COLON SURGERY N/A    2014   DILATION AND CURETTAGE OF UTERUS  11/06/2013   HAND LIGAMENT RECONSTRUCTION Right ~ 2010   HYSTEROSCOPY WITH D & C N/A 11/06/2013   Procedure: DILATATION AND CURETTAGE /HYSTEROSCOPY;  Surgeon: Tilda Burrow, MD;  Location: [facility];  Service: Gynecology;  Laterality: N/A;   INSERT / REPLACE / REMOVE PACEMAKER  06/22/2017   LUNG LOBECTOMY Right 1988   Fungal Infection   PACEMAKER IMPLANT N/A 06/22/2017   Procedure: PACEMAKER IMPLANT;  Surgeon: Duke Salvia, MD;  Location: [facility];  Service: Cardiovascular;  Laterality: N/A;   PARTIAL MASTECTOMY WITH NEEDLE LOCALIZATION Right 01/02/2020   Procedure: RIGHT PARTIAL MASTECTOMY AFTER NEEDLE LOCALIZATION;  Surgeon: Franky Macho, MD;  Location: [facility];  Service: General;  Laterality:  Right;   POLYPECTOMY N/A 11/06/2013   Procedure: POLYPECTOMY (REMOVAL ENDOMETRIAL POLYP);  Surgeon: Tilda Burrow, MD;  Location: [facility];  Service: Gynecology;  Laterality: N/A;   RECTOCELE REPAIR N/A 11/06/2013   Procedure: POSTERIOR REPAIR (RECTOCELE);  Surgeon: Tilda Burrow, MD;  Location: [facility];  Service: Gynecology;  Laterality: N/A;   SHOULDER ARTHROSCOPY WITH OPEN ROTATOR CUFF REPAIR Left 07/26/2012   TONSILLECTOMY  1949   TUBAL LIGATION      Family History  Problem Relation Age of Onset   Hypertension Mother    Heart disease Mother    Diabetes Mother    Kidney disease Mother        ESRD   Cancer Mother        Bladder   Hypertension Father    Hyperlipidemia  Father    Heart disease Father    Diabetes Father    Cancer Father        Stomach Cancer   Diabetes Sister    Heart disease Sister    Alzheimer's disease Paternal Aunt    Stroke Maternal Grandfather    Cancer Paternal Grandfather        stomach   Cancer Maternal Uncle        prostate   Cancer Paternal Uncle        stomach    Social History   Socioeconomic History   Marital status: Married    Spouse name: Kandee Keen   Number of children: 2   Years of education: 12   Highest education level: Some college, no degree  Occupational History   Occupation: retired     Comment: Advice worker for [employer]  Tobacco Use   Smoking status: Never   Smokeless tobacco: Never  Vaping Use   Vaping status: Never Used  Substance and Sexual Activity   Alcohol use: Not Currently    Comment: Twice a year   Drug use: No   Sexual activity: Not Currently    Birth control/protection: Surgical  Other Topics Concern   Not on file  Social History Narrative   Not on file   Social Determinants of Health   Financial Resource Strain: Low Risk  (04/26/2022)   Overall Financial Resource Strain (CARDIA)    Difficulty of Paying Living Expenses: Not very hard  Food Insecurity: Low Risk  (07/28/2022)   [facility]   Hunger Vital Sign    Worried About Running Out of Food in the Last Year: Never true    Ran Out of Food in the Last Year: Never true  Transportation Needs: No Transportation Needs (07/28/2022)   [facility]   Transportation    In the past 12 months, has lack of reliable transportation kept you from medical appointments, meetings, work or from getting things needed for daily living? : No  Physical Activity: Inactive (04/26/2022)   Exercise Vital Sign    Days of Exercise per Week: 0 days    Minutes of Exercise per Session: 0 min  Stress: No Stress Concern Present (04/26/2022)   [employer] of Occupational Health -  Occupational Stress Questionnaire    Feeling of Stress : Only a little  Social Connections: Moderately Integrated (04/26/2022)   Social Connection and Isolation Panel [NHANES]    Frequency of Communication with Friends and Family: Once a week    Frequency of Social Gatherings with Friends and Family: Once a week    Attends Religious Services: More than  4 times per year    Active Member of Clubs or Organizations: Yes    Attends Banker Meetings: More than 4 times per year    Marital Status: Married  Catering manager Violence: Not At Risk (04/26/2022)   Humiliation, Afraid, Rape, and Kick questionnaire    Fear of Current or Ex-Partner: No    Emotionally Abused: No    Physically Abused: No    Sexually Abused: No    Outpatient Medications Prior to Visit  Medication Sig Dispense Refill   acetaminophen (TYLENOL) 500 MG tablet Take 1,000 mg by mouth every 6 (six) hours as needed for moderate pain.     acyclovir ointment (ZOVIRAX) 5 % Apply small amount of ointment to rectal lesion 6 times per day x7 days 30 g 0   albuterol (VENTOLIN HFA) 108 (90 Base) MCG/ACT inhaler Inhale 2 puffs into the lungs every 6 (six) hours as needed for wheezing or shortness of breath. 8 g 0   Aloe Vera Leaf POWD Take 800 mg by mouth 2 (two) times daily.     Ascorbic Acid (VITAMIN C) 1000 MG tablet Take 1,000 mg by mouth 2 (two) times daily.     b complex vitamins tablet Take 1 tablet by mouth 2 (two) times daily.      Blood Glucose Monitoring Suppl (BLOOD GLUCOSE SYSTEM PAK) KIT Use as directed to monitor FSBS 1x daily. Dx: E11.9. Please dispense as Accu-Chek Aviva 1 each 1   Calcium Polycarbophil (FIBER-CAPS PO) Take by mouth daily at 6 (six) AM. 3 qam and Two po QHS     Cholecalciferol (VITAMIN D3) 2000 UNITS TABS Take 2,000 Units by mouth 2 (two) times daily.     clotrimazole-betamethasone (LOTRISONE) cream Apply 1 Application topically daily. 30 g 0   dapagliflozin propanediol (FARXIGA) 10 MG TABS  tablet Take 1 tablet (10 mg total) by mouth daily before breakfast. 90 tablet 3   diclofenac Sodium (VOLTAREN) 1 % GEL Apply 2 g topically 4 (four) times daily. (Patient taking differently: Apply 2 g topically 4 (four) times daily. prn) 100 g 1   fluocinonide cream (LIDEX) 0.05 % Apply 1 Application topically 2 (two) times daily as needed (poison ivy/bites/hives). 30 g 11   Ginkgo Biloba 120 MG CAPS Take 120 mg by mouth in the morning and at bedtime.      Glucose Blood (BLOOD GLUCOSE TEST STRIPS) STRP Use as directed to monitor FSBS 1x daily. Dx: E11.9. Please dispense as Accu-Chek Aviva 100 each 1   KRILL OIL PO Take 1,500 mg by mouth 2 (two) times daily.     Lancets MISC Use as directed to monitor FSBS 1x daily. Dx: E11.9. Please dispense as Accu-Chek Aviva 100 each 1   loperamide (IMODIUM A-D) 2 MG tablet Take 1 tablet (2 mg total) by mouth 4 (four) times daily as needed for diarrhea or loose stools. 30 tablet 1   metoprolol succinate (TOPROL-XL) 25 MG 24 hr tablet Take 1/2 (one-half) tablet by mouth once daily 45 tablet 2   Multiple Vitamin (MULTIVITAMIN WITH MINERALS) TABS tablet Take 1 tablet by mouth daily.     naftifine (NAFTIN) 1 % cream Apply topically 2 (two) times daily as needed. 60 g 2   rosuvastatin (CRESTOR) 5 MG tablet TAKE 1 TABLET BY MOUTH ONCE DAILY FOR CHOLESTEROL 90 tablet 0   simethicone (MYLICON) 80 MG chewable tablet Chew 80 mg by mouth as needed for flatulence.      tiZANidine (ZANAFLEX) 4 MG  tablet Take 1 tablet (4 mg total) by mouth every 8 (eight) hours as needed for muscle spasms. 90 tablet 1   torsemide (DEMADEX) 20 MG tablet Take 1 tablet (20 mg total) by mouth 3 (three) times a week. (Patient taking differently: Take 10 mg by mouth 3 (three) times a week. 10 mg about three times a week) 40 tablet 3   glipiZIDE (GLUCOTROL XL) 5 MG 24 hr tablet Take 1 tablet by mouth once daily with breakfast 90 tablet 0   Semaglutide, 1 MG/DOSE, 4 MG/3ML SOPN Inject 1 mg as directed  once a week. 9 mL 1   UNABLE TO FIND Please dispense custom molded foot orthotics and heel spurs Dx: E11.30 1 each 0   cephALEXin (KEFLEX) 500 MG capsule Take 1 capsule (500 mg total) by mouth 2 (two) times daily. 10 capsule 0   No facility-administered medications prior to visit.    Allergies  Allergen Reactions   Arimidex [Anastrozole] Other (See Comments)    Arthralgias and myalgias.  Improved with 3 week hiatus from drug.  Categorical side effect of drug class.   Other Hives and Itching   Adhesive [Tape] Other (See Comments)    "red and blistered"   Lisinopril     Dry hacking cough and tickle   Tetanus Toxoids    Tetracyclines & Related Other (See Comments)    unknown    ROS Review of Systems  Constitutional:  Negative for chills and fever.  HENT:  Negative for congestion, sinus pressure, sinus pain and sore throat.   Eyes:  Negative for pain and discharge.  Respiratory:  Negative for cough and shortness of breath.   Cardiovascular:  Negative for chest pain and palpitations.  Gastrointestinal:  Positive for diarrhea. Negative for abdominal pain, blood in stool, nausea and vomiting.  Endocrine: Negative for polydipsia and polyuria.  Genitourinary:  Negative for dysuria and hematuria.  Musculoskeletal:  Negative for neck pain and neck stiffness.  Skin:  Positive for color change (Left face area mole). Negative for rash.  Neurological:  Negative for dizziness and weakness.  Psychiatric/Behavioral:  Negative for agitation and behavioral problems.       Objective:    Physical Exam Vitals reviewed.  Constitutional:      General: She is not in acute distress.    Appearance: She is obese. She is not diaphoretic.  HENT:     Head: Normocephalic and atraumatic.     Nose: No congestion.     Mouth/Throat:     Mouth: Mucous membranes are moist.     Pharynx: No posterior oropharyngeal erythema.  Eyes:     General: No scleral icterus.    Extraocular Movements: Extraocular  movements intact.  Cardiovascular:     Rate and Rhythm: Normal rate and regular rhythm.     Pulses: Normal pulses.     Heart sounds: Normal heart sounds. No murmur heard. Pulmonary:     Breath sounds: Normal breath sounds. No wheezing or rales.  Musculoskeletal:     Cervical back: Neck supple. No tenderness.     Right lower leg: No edema.     Left lower leg: No edema.  Skin:    General: Skin is warm.     Findings: Lesion (Brownish papule over left side of face and left upper arm - likely actinic keratoses) present. No rash.  Neurological:     General: No focal deficit present.     Mental Status: She is alert and oriented to person, place,  and time.     Sensory: No sensory deficit.     Motor: No weakness.  Psychiatric:        Mood and Affect: Mood normal.        Behavior: Behavior normal.     BP 139/65   Pulse 83   Resp 16   Ht 5\' 5"  (1.651 m)   Wt 218 lb 12.8 oz (99.2 kg)   SpO2 94%   BMI 36.41 kg/m  Wt Readings from Last 3 Encounters:  12/29/22 218 lb 12.8 oz (99.2 kg)  12/13/22 218 lb 1.6 oz (98.9 kg)  11/23/22 219 lb (99.3 kg)    Lab Results  Component Value Date   TSH 2.980 06/18/2022   Lab Results  Component Value Date   WBC 9.1 12/24/2022   HGB 15.1 12/24/2022   HCT 48.5 (H) 12/24/2022   MCV 90 12/24/2022   PLT 255 12/24/2022   Lab Results  Component Value Date   NA 143 12/24/2022   K 4.5 12/24/2022   CO2 23 12/24/2022   GLUCOSE 103 (H) 12/24/2022   BUN 17 12/24/2022   CREATININE 0.94 12/24/2022   BILITOT 0.5 12/24/2022   ALKPHOS 69 12/24/2022   AST 19 12/24/2022   ALT 14 12/24/2022   PROT 6.6 12/24/2022   ALBUMIN 4.4 12/24/2022   CALCIUM 9.9 12/24/2022   ANIONGAP 9 09/01/2022   EGFR 61 12/24/2022   Lab Results  Component Value Date   CHOL 170 06/18/2022   Lab Results  Component Value Date   HDL 47 06/18/2022   Lab Results  Component Value Date   LDLCALC 78 06/18/2022   Lab Results  Component Value Date   TRIG 280 (H) 06/18/2022    Lab Results  Component Value Date   CHOLHDL 3.6 06/18/2022   Lab Results  Component Value Date   HGBA1C 6.4 (H) 12/24/2022      Assessment & Plan:   Problem List Items Addressed This Visit       Cardiovascular and Mediastinum   Essential hypertension, benign - Primary    BP Readings from Last 1 Encounters:  12/29/22 139/65   Well-controlled with Metoprolol (for CHF) Counseled for compliance with the medications Advised DASH diet and moderate exercise/walking as tolerated      Mobitz type 2 second degree AV block    St. Jude pacemaker in place with history of symptomatic second-degree type II heart block.  She continues to follow with Dr. Graciela Husbands.      (HFpEF) heart failure with preserved ejection fraction (HCC)    Appears euvolemic currently Takes Demadex every other day On Farxiga and metoprolol Followed by Cardiology        Digestive   IBS (irritable bowel syndrome)    Chronic diarrhea, could be related to IBS-D - somewhat improved since stopping Metformin Imodium as needed Referred to GI - advised benefiber If persistent, will give trial of Xifaxan        Endocrine   DM (diabetes mellitus) (HCC)    Lab Results  Component Value Date   HGBA1C 6.4 (H) 12/24/2022   Well-controlled On glipizide 5 mg QD, Farxiga 10 mg QD and Ozempic 1 mg qw Advised to follow diabetic diet On statin F/u CMP and lipid panel Diabetic eye exam: Advised to follow up with Ophthalmology for diabetic eye exam  Has diabetic neuropathy, intermittent numbness of feet - has had diabetic shoes, but needs new pair, new prescription sent      Relevant Medications  UNABLE TO FIND   glipiZIDE (GLUCOTROL XL) 5 MG 24 hr tablet   Semaglutide, 1 MG/DOSE, 4 MG/3ML SOPN   Diabetic neuropathy (HCC)    Prescribed custom molded orthotics      Relevant Medications   UNABLE TO FIND   glipiZIDE (GLUCOTROL XL) 5 MG 24 hr tablet   Semaglutide, 1 MG/DOSE, 4 MG/3ML SOPN     Other    Hyperlipidemia    On Crestor      Other Visit Diagnoses     Encounter for immunization       Relevant Orders   Flu Vaccine Trivalent High Dose (Fluad) (Completed)       Meds ordered this encounter  Medications   UNABLE TO FIND    Sig: Please dispense custom molded foot orthotics and heel spurs Dx: E11.30    Dispense:  1 each    Refill:  0   glipiZIDE (GLUCOTROL XL) 5 MG 24 hr tablet    Sig: Take 1 tablet (5 mg total) by mouth daily with breakfast.    Dispense:  90 tablet    Refill:  1   DISCONTD: Semaglutide, 1 MG/DOSE, 4 MG/3ML SOPN    Sig: Inject 1 mg as directed once a week.    Dispense:  9 mL    Refill:  1   Semaglutide, 1 MG/DOSE, 4 MG/3ML SOPN    Sig: Inject 1 mg as directed once a week.    Dispense:  9 mL    Refill:  1    Follow-up: Return in about 6 months (around 06/28/2023) for Annual physical.    Anabel Halon, MD

## 2022-12-29 NOTE — Assessment & Plan Note (Signed)
On Crestor 

## 2022-12-29 NOTE — Assessment & Plan Note (Signed)
BP Readings from Last 1 Encounters:  12/29/22 139/65   Well-controlled with Metoprolol (for CHF) Counseled for compliance with the medications Advised DASH diet and moderate exercise/walking as tolerated

## 2022-12-29 NOTE — Assessment & Plan Note (Addendum)
Appears euvolemic currently Takes Demadex every other day On Farxiga and metoprolol Followed by Cardiology

## 2022-12-29 NOTE — Patient Instructions (Addendum)
Please continue to take medications as prescribed.  Please continue to follow low carb diet and ambulate as tolerated. 

## 2022-12-29 NOTE — Assessment & Plan Note (Signed)
St. Jude pacemaker in place with history of symptomatic second-degree type II heart block.  She continues to follow with Dr. Graciela Husbands.

## 2022-12-29 NOTE — Assessment & Plan Note (Addendum)
Chronic diarrhea, could be related to IBS-D - somewhat improved since stopping Metformin Imodium as needed Referred to GI - advised benefiber If persistent, will give trial of Xifaxan

## 2022-12-29 NOTE — Assessment & Plan Note (Addendum)
Lab Results  Component Value Date   HGBA1C 6.4 (H) 12/24/2022   Well-controlled On glipizide 5 mg QD, Farxiga 10 mg QD and Ozempic 1 mg qw Advised to follow diabetic diet On statin F/u CMP and lipid panel Diabetic eye exam: Advised to follow up with Ophthalmology for diabetic eye exam  Has diabetic neuropathy, intermittent numbness of feet - has had diabetic shoes, but needs new pair, new prescription sent

## 2023-01-05 NOTE — Progress Notes (Signed)
Remote pacemaker transmission.   

## 2023-01-06 DIAGNOSIS — H43391 Other vitreous opacities, right eye: Secondary | ICD-10-CM | POA: Diagnosis not present

## 2023-01-06 DIAGNOSIS — D3131 Benign neoplasm of right choroid: Secondary | ICD-10-CM | POA: Diagnosis not present

## 2023-01-06 DIAGNOSIS — E119 Type 2 diabetes mellitus without complications: Secondary | ICD-10-CM | POA: Diagnosis not present

## 2023-01-06 DIAGNOSIS — H43813 Vitreous degeneration, bilateral: Secondary | ICD-10-CM | POA: Diagnosis not present

## 2023-01-06 DIAGNOSIS — Z961 Presence of intraocular lens: Secondary | ICD-10-CM | POA: Diagnosis not present

## 2023-01-06 LAB — HM DIABETES EYE EXAM

## 2023-01-11 DIAGNOSIS — M1712 Unilateral primary osteoarthritis, left knee: Secondary | ICD-10-CM | POA: Diagnosis not present

## 2023-01-14 ENCOUNTER — Other Ambulatory Visit: Payer: Self-pay | Admitting: Internal Medicine

## 2023-02-09 DIAGNOSIS — D3131 Benign neoplasm of right choroid: Secondary | ICD-10-CM | POA: Diagnosis not present

## 2023-02-09 DIAGNOSIS — Z961 Presence of intraocular lens: Secondary | ICD-10-CM | POA: Diagnosis not present

## 2023-02-09 DIAGNOSIS — H43813 Vitreous degeneration, bilateral: Secondary | ICD-10-CM | POA: Diagnosis not present

## 2023-02-09 DIAGNOSIS — H43391 Other vitreous opacities, right eye: Secondary | ICD-10-CM | POA: Diagnosis not present

## 2023-02-09 DIAGNOSIS — E119 Type 2 diabetes mellitus without complications: Secondary | ICD-10-CM | POA: Diagnosis not present

## 2023-03-04 ENCOUNTER — Telehealth: Payer: Self-pay | Admitting: Internal Medicine

## 2023-03-04 NOTE — Telephone Encounter (Signed)
Patient would like to switch provides from Dr. Graciela Husbands to Dr. Nelly Laurence d/t location preference.   Thanks guys!  Cassie

## 2023-03-14 ENCOUNTER — Other Ambulatory Visit: Payer: Self-pay | Admitting: Student

## 2023-03-14 NOTE — Telephone Encounter (Signed)
Ok with me 

## 2023-03-23 ENCOUNTER — Ambulatory Visit (INDEPENDENT_AMBULATORY_CARE_PROVIDER_SITE_OTHER): Payer: Medicare PPO

## 2023-03-23 DIAGNOSIS — I441 Atrioventricular block, second degree: Secondary | ICD-10-CM

## 2023-03-23 LAB — CUP PACEART REMOTE DEVICE CHECK
Battery Remaining Longevity: 38 mo
Battery Remaining Percentage: 39 %
Battery Voltage: 2.96 V
Brady Statistic AP VP Percent: 5 %
Brady Statistic AP VS Percent: 1 %
Brady Statistic AS VP Percent: 95 %
Brady Statistic AS VS Percent: 1 %
Brady Statistic RA Percent Paced: 4.8 %
Brady Statistic RV Percent Paced: 99 %
Date Time Interrogation Session: 20241204020014
Implantable Lead Connection Status: 753985
Implantable Lead Connection Status: 753985
Implantable Lead Implant Date: 20190306
Implantable Lead Implant Date: 20190306
Implantable Lead Location: 753859
Implantable Lead Location: 753860
Implantable Lead Model: 5076
Implantable Lead Model: 5076
Implantable Pulse Generator Implant Date: 20190306
Lead Channel Impedance Value: 350 Ohm
Lead Channel Impedance Value: 430 Ohm
Lead Channel Pacing Threshold Amplitude: 0.5 V
Lead Channel Pacing Threshold Amplitude: 0.75 V
Lead Channel Pacing Threshold Pulse Width: 0.5 ms
Lead Channel Pacing Threshold Pulse Width: 0.5 ms
Lead Channel Sensing Intrinsic Amplitude: 3.3 mV
Lead Channel Sensing Intrinsic Amplitude: 5.4 mV
Lead Channel Setting Pacing Amplitude: 2 V
Lead Channel Setting Pacing Amplitude: 2.5 V
Lead Channel Setting Pacing Pulse Width: 0.5 ms
Lead Channel Setting Sensing Sensitivity: 4 mV
Pulse Gen Model: 2272
Pulse Gen Serial Number: 9001027

## 2023-03-25 ENCOUNTER — Encounter: Payer: Self-pay | Admitting: Cardiovascular Disease

## 2023-03-25 ENCOUNTER — Ambulatory Visit: Payer: Medicare PPO | Attending: Cardiovascular Disease | Admitting: Cardiovascular Disease

## 2023-03-25 VITALS — BP 112/60 | HR 70 | Ht 65.0 in | Wt 221.0 lb

## 2023-03-25 DIAGNOSIS — I441 Atrioventricular block, second degree: Secondary | ICD-10-CM | POA: Diagnosis not present

## 2023-03-25 DIAGNOSIS — I10 Essential (primary) hypertension: Secondary | ICD-10-CM | POA: Diagnosis not present

## 2023-03-25 NOTE — Patient Instructions (Signed)
Medication Instructions:  Continue all current medications.  Labwork: none  Testing/Procedures: none  Follow-Up: 1 year   Any Other Special Instructions Will Be Listed Below (If Applicable).  If you need a refill on your cardiac medications before your next appointment, please call your pharmacy.  

## 2023-03-25 NOTE — Progress Notes (Signed)
  Electrophysiology Office Note:    Date:  03/25/2023   ID:  Julie Clay, DOB 27-May-1940, MRN [id number]  PCP:  Anabel Halon, MD   [facility] Providers Cardiologist:  Nona Dell, MD Electrophysiologist:  Maurice Small, MD     Referring MD: Anabel Halon, MD   History of Present Illness:    Julie Clay is a 82 y.o. 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     She is a Presenter, broadcasting pacemaker placed by Dr. Graciela Husbands for second-degree type II heart block.  She has a history of left lymphedema, so the device was placed on the right.     Today, she reports that she is doing well. she has no device related complaints -- no new tenderness, drainage, redness.   EKGs/Labs/Other Studies Reviewed Today:     Echocardiogram:  TTE July 21, 2020 EF 55-60%. Left atrium mildly dilated. Gr I diastolic dysfunction     Advanced imaging:  CT coronary 04/10/2022 Low likelihood of hemodynamic difficult disease    EKG:   EKG Interpretation Date/Time:  Friday March 25 2023 14:22:15 EST Ventricular Rate:  66 PR Interval:  214 QRS Duration:  140 QT Interval:  440 QTC Calculation: 461 R Axis:   198  Text Interpretation: Atrial-sensed ventricular-paced rhythm with prolonged AV conduction When compared with ECG of 23-Nov-2022 14:01, No significant change was found Confirmed by York Pellant [phone]) on 03/25/2023 3:07:06 PM     Physical Exam:    VS:  BP 112/60   Pulse 70   Ht 5\' 5"  (1.651 m)   Wt 221 lb (100.2 kg)   SpO2 96%   BMI 36.78 kg/m     Wt Readings from Last 3 Encounters:  03/25/23 221 lb (100.2 kg)  12/29/22 218 lb 12.8 oz (99.2 kg)  12/13/22 218 lb 1.6 oz (98.9 kg)     GEN:  Well nourished, well developed in no acute distress CARDIAC: RRR, no murmurs, rubs, gallops The device site is normal -- no tenderness, edema, drainage,  redness, threatened erosion.  RESPIRATORY:  Normal work of breathing MUSCULOSKELETAL: trace edema    ASSESSMENT & PLAN:     Second degree AV block St Jude pacemaker in place I reviewed today's device interrogation.  See Paceart for details Device is functioning normally She is device dependent today  CHFpEF Appears compensated      Signed, Maurice Small, MD  03/25/2023 3:14 PM    [facility]

## 2023-03-30 LAB — CUP PACEART INCLINIC DEVICE CHECK
Date Time Interrogation Session: 20241206153544
Implantable Lead Connection Status: 753985
Implantable Lead Connection Status: 753985
Implantable Lead Implant Date: 20190306
Implantable Lead Implant Date: 20190306
Implantable Lead Location: 753859
Implantable Lead Location: 753860
Implantable Lead Model: 5076
Implantable Lead Model: 5076
Implantable Pulse Generator Implant Date: 20190306
Pulse Gen Model: 2272
Pulse Gen Serial Number: 9001027

## 2023-04-22 ENCOUNTER — Other Ambulatory Visit: Payer: Self-pay | Admitting: Cardiology

## 2023-04-22 MED ORDER — TORSEMIDE 20 MG PO TABS: 20.0000 mg | ORAL_TABLET | ORAL | 3 refills | Status: DC

## 2023-04-26 ENCOUNTER — Other Ambulatory Visit: Payer: Self-pay | Admitting: Internal Medicine

## 2023-05-10 ENCOUNTER — Ambulatory Visit (INDEPENDENT_AMBULATORY_CARE_PROVIDER_SITE_OTHER): Payer: Medicare PPO

## 2023-05-10 VITALS — Ht 65.0 in | Wt 215.6 lb

## 2023-05-10 DIAGNOSIS — Z0000 Encounter for general adult medical examination without abnormal findings: Secondary | ICD-10-CM

## 2023-05-10 NOTE — Patient Instructions (Signed)
Julie Clay , Thank you for taking time to come for your Medicare Wellness Visit. I appreciate your ongoing commitment to your health goals. Please review the following plan we discussed and let me know if I can assist you in the future.   Referrals/Orders/Follow-Ups/Clinician Recommendations:  Next Medicare Annual Wellness Visit: May 14, 2024 at 3:50pm virtual visit  This is a list of the screening recommended for you and due dates:  Health Maintenance  Topic Date Due   Zoster (Shingles) Vaccine (1 of 2) 08/24/1959   COVID-19 Vaccine (4 - 2024-25 season) 12/19/2022   DEXA scan (bone density measurement)  04/19/2048*   Hemoglobin A1C  06/23/2023   Yearly kidney health urinalysis for diabetes  06/25/2023   Complete foot exam   06/25/2023   Mammogram  12/08/2023   Yearly kidney function blood test for diabetes  12/24/2023   Eye exam for diabetics  01/06/2024   Medicare Annual Wellness Visit  05/09/2024   Pneumonia Vaccine  Completed   Flu Shot  Completed   HPV Vaccine  Aged Out   DTaP/Tdap/Td vaccine  Discontinued   Hepatitis C Screening  Discontinued  *Topic was postponed. The date shown is not the original due date.    Advanced directives: (Declined) Advance directive discussed with you today. Even though you declined this today, please call our office should you change your mind, and we can give you the proper paperwork for you to fill out.  Next Medicare Annual Wellness Visit scheduled for next year: yes  Preventive Care 65 Years and Older, Female Preventive care refers to lifestyle choices and visits with your health care provider that can promote health and wellness. Preventive care visits are also called wellness exams. What can I expect for my preventive care visit? Counseling Your health care provider may ask you questions about your: Medical history, including: Past medical problems. Family medical history. Pregnancy and menstrual history. History of falls. Current  health, including: Memory and ability to understand (cognition). Emotional well-being. Home life and relationship well-being. Sexual activity and sexual health. Lifestyle, including: Alcohol, nicotine or tobacco, and drug use. Access to firearms. Diet, exercise, and sleep habits. Work and work Astronomer. Sunscreen use. Safety issues such as seatbelt and bike helmet use. Physical exam Your health care provider will check your: Height and weight. These may be used to calculate your BMI (body mass index). BMI is a measurement that tells if you are at a healthy weight. Waist circumference. This measures the distance around your waistline. This measurement also tells if you are at a healthy weight and may help predict your risk of certain diseases, such as type 2 diabetes and high blood pressure. Heart rate and blood pressure. Body temperature. Skin for abnormal spots. What immunizations do I need?  Vaccines are usually given at various ages, according to a schedule. Your health care provider will recommend vaccines for you based on your age, medical history, and lifestyle or other factors, such as travel or where you work. What tests do I need? Screening Your health care provider may recommend screening tests for certain conditions. This may include: Lipid and cholesterol levels. Hepatitis C test. Hepatitis B test. HIV (human immunodeficiency virus) test. STI (sexually transmitted infection) testing, if you are at risk. Lung cancer screening. Colorectal cancer screening. Diabetes screening. This is done by checking your blood sugar (glucose) after you have not eaten for a while (fasting). Mammogram. Talk with your health care provider about how often you should have regular mammograms.  BRCA-related cancer screening. This may be done if you have a family history of breast, ovarian, tubal, or peritoneal cancers. Bone density scan. This is done to screen for osteoporosis. Talk with  your health care provider about your test results, treatment options, and if necessary, the need for more tests. Follow these instructions at home: Eating and drinking  Eat a diet that includes fresh fruits and vegetables, whole grains, lean protein, and low-fat dairy products. Limit your intake of foods with high amounts of sugar, saturated fats, and salt. Take vitamin and mineral supplements as recommended by your health care provider. Do not drink alcohol if your health care provider tells you not to drink. If you drink alcohol: Limit how much you have to 0-1 drink a day. Know how much alcohol is in your drink. In the [place] one drink equals one 12 oz bottle of beer (355 mL), one 5 oz glass of wine (148 mL), or one 1 oz glass of hard liquor (44 mL). Lifestyle Brush your teeth every morning and night with fluoride toothpaste. Floss one time each day. Exercise for at least 30 minutes 5 or more days each week. Do not use any products that contain nicotine or tobacco. These products include cigarettes, chewing tobacco, and vaping devices, such as e-cigarettes. If you need help quitting, ask your health care provider. Do not use drugs. If you are sexually active, practice safe sex. Use a condom or other form of protection in order to prevent STIs. Take aspirin only as told by your health care provider. Make sure that you understand how much to take and what form to take. Work with your health care provider to find out whether it is safe and beneficial for you to take aspirin daily. Ask your health care provider if you need to take a cholesterol-lowering medicine (statin). Find healthy ways to manage stress, such as: Meditation, yoga, or listening to music. Journaling. Talking to a trusted person. Spending time with friends and family. Minimize exposure to UV radiation to reduce your risk of skin cancer. Safety Always wear your seat belt while driving or riding in a vehicle. Do not drive: If  you have been drinking alcohol. Do not ride with someone who has been drinking. When you are tired or distracted. While texting. If you have been using any mind-altering substances or drugs. Wear a helmet and other protective equipment during sports activities. If you have firearms in your house, make sure you follow all gun safety procedures. What's next? Visit your health care provider once a year for an annual wellness visit. Ask your health care provider how often you should have your eyes and teeth checked. Stay up to date on all vaccines. This information is not intended to replace advice given to you by your health care provider. Make sure you discuss any questions you have with your health care provider. Document Revised: 10/01/2020 Document Reviewed: 10/01/2020 Elsevier Patient Education  2024 [employer].  Understanding Your Risk for Falls Millions of people have serious injuries from falls each year. It is important to understand your risk of falling. Talk with your health care provider about your risk and what you can do to lower it. If you do have a serious fall, make sure to tell your provider. Falling once raises your risk of falling again. How can falls affect me? Serious injuries from falls are common. These include: Broken bones, such as hip fractures. Head injuries, such as traumatic brain injuries (TBI) or concussions. A  fear of falling can cause you to avoid activities and stay at home. This can make your muscles weaker and raise your risk for a fall. What can increase my risk? There are a number of risk factors that increase your risk for falling. The more risk factors you have, the higher your risk of falling. Serious injuries from a fall happen most often to people who are older than 81 years old. Teenagers and young adults ages 11-29 are also at higher risk. Common risk factors include: Weakness in the lower body. Being generally weak or confused due to long-term  (chronic) illness. Dizziness or balance problems. Poor vision. Medicines that cause dizziness or drowsiness. These may include: Medicines for your blood pressure, heart, anxiety, insomnia, or swelling (edema). Pain medicines. Muscle relaxants. Other risk factors include: Drinking alcohol. Having had a fall in the past. Having foot pain or wearing improper footwear. Working at a dangerous job. Having any of the following in your home: Tripping hazards, such as floor clutter or loose rugs. Poor lighting. Pets. Having dementia or memory loss. What actions can I take to lower my risk of falling?     Physical activity Stay physically fit. Do strength and balance exercises. Consider taking a regular class to build strength and balance. Yoga and tai chi are good options. Vision Have your eyes checked every year and your prescription for glasses or contacts updated as needed. Shoes and walking aids Wear non-skid shoes. Wear shoes that have rubber soles and low heels. Do not wear high heels. Do not walk around the house in socks or slippers. Use a cane or walker as told by your provider. Home safety Attach secure railings on both sides of your stairs. Install grab bars for your bathtub, shower, and toilet. Use a non-skid mat in your bathtub or shower. Attach bath mats securely with double-sided, non-slip rug tape. Use good lighting in all rooms. Keep a flashlight near your bed. Make sure there is a clear path from your bed to the bathroom. Use night-lights. Do not use throw rugs. Make sure all carpeting is taped or tacked down securely. Remove all clutter from walkways and stairways, including extension cords. Repair uneven or broken steps and floors. Avoid walking on icy or slippery surfaces. Walk on the grass instead of on icy or slick sidewalks. Use ice melter to get rid of ice on walkways in the winter. Use a cordless phone. Questions to ask your health care provider Can you help  me check my risk for a fall? Do any of my medicines make me more likely to fall? Should I take a vitamin D supplement? What exercises can I do to improve my strength and balance? Should I make an appointment to have my vision checked? Do I need a bone density test to check for weak bones (osteoporosis)? Would it help to use a cane or a walker? Where to find more information Centers for Disease Control and Prevention, [employer]: TonerPromos.no Community-Based Fall Prevention Programs: TonerPromos.no [employer] on Aging: BaseRingTones.pl Contact a health care provider if: You fall at home. You are afraid of falling at home. You feel weak, drowsy, or dizzy. This information is not intended to replace advice given to you by your health care provider. Make sure you discuss any questions you have with your health care provider. Document Revised: 12/07/2021 Document Reviewed: 12/07/2021 Elsevier Patient Education  2024 [employer].

## 2023-05-10 NOTE — Progress Notes (Signed)
 Because this visit was a virtual/telehealth visit,  certain criteria was not obtained, such a blood pressure, CBG if applicable, and timed get up and go. Any medications not marked as "taking" were not mentioned during the medication reconciliation part of the visit. Any vitals not documented were not able to be obtained due to this being a telehealth visit or patient was unable to self-report a recent blood pressure reading due to a lack of equipment at home via telehealth. Vitals that have been documented are verbally provided by the patient.  Interactive audio and video telecommunications were attempted between this provider and patient, however failed, due to patient having technical difficulties OR patient did not have access to video capability.  We continued and completed visit with audio only.  Subjective:   Julie Clay is a 83 y.o. female who presents for Medicare Annual (Subsequent) preventive examination.  Visit Complete: Virtual I connected with  Norton Blizzard on 05/10/23 by a audio enabled telemedicine application and verified that I am speaking with the correct person using two identifiers.  Patient Location: Home  Provider Location: Home Office  I discussed the limitations of evaluation and management by telemedicine. The patient expressed understanding and agreed to proceed.  Vital Signs: Because this visit was a virtual/telehealth visit, some criteria may be missing or patient reported. Any vitals not documented were not able to be obtained and vitals that have been documented are patient reported.  Patient Medicare AWV questionnaire was completed by the patient on na; I have confirmed that all information answered by patient is correct and no changes since this date.  Cardiac Risk Factors include: advanced age (>78men, >73 women);diabetes mellitus;dyslipidemia;hypertension;obesity (BMI >30kg/m2);sedentary lifestyle     Objective:    Today's Vitals   05/10/23 1050   Weight: 215 lb 9.6 oz (97.8 kg)  Height: 5\' 5"  (1.651 m)   Body mass index is 35.88 kg/m.     05/10/2023   10:49 AM 06/12/2021    2:39 PM 04/21/2021   10:46 AM 11/10/2020    1:13 PM 04/09/2020    9:42 AM 02/19/2020   10:59 AM 02/07/2020    4:00 PM  Advanced Directives  Does Patient Have a Medical Advance Directive? No No No No Yes No No  Would patient like information on creating a medical advance directive? No - Patient declined  No - Patient declined No - Patient declined  No - Patient declined No - Patient declined    Current Medications (verified) Outpatient Encounter Medications as of 05/10/2023  Medication Sig   acetaminophen (TYLENOL) 500 MG tablet Take 1,000 mg by mouth every 6 (six) hours as needed for moderate pain.   acyclovir ointment (ZOVIRAX) 5 % Apply small amount of ointment to rectal lesion 6 times per day x7 days   albuterol (VENTOLIN HFA) 108 (90 Base) MCG/ACT inhaler Inhale 2 puffs into the lungs every 6 (six) hours as needed for wheezing or shortness of breath.   Aloe Vera Leaf POWD Take 800 mg by mouth 2 (two) times daily.   Ascorbic Acid (VITAMIN C) 1000 MG tablet Take 1,000 mg by mouth 2 (two) times daily.   b complex vitamins tablet Take 1 tablet by mouth 2 (two) times daily.    Blood Glucose Monitoring Suppl (BLOOD GLUCOSE SYSTEM PAK) KIT Use as directed to monitor FSBS 1x daily. Dx: E11.9. Please dispense as Accu-Chek Aviva   Calcium Polycarbophil (FIBER-CAPS PO) Take by mouth daily at 6 (six) AM. 3 qam and Two  po QHS   Cholecalciferol (VITAMIN D3) 2000 UNITS TABS Take 2,000 Units by mouth 2 (two) times daily.   clotrimazole-betamethasone (LOTRISONE) cream Apply 1 Application topically daily.   dapagliflozin propanediol (FARXIGA) 10 MG TABS tablet Take 1 tablet (10 mg total) by mouth daily before breakfast.   diclofenac Sodium (VOLTAREN) 1 % GEL Apply 2 g topically 4 (four) times daily. (Patient taking differently: Apply 2 g topically 4 (four) times daily. prn)    fluocinonide cream (LIDEX) 0.05 % Apply 1 Application topically 2 (two) times daily as needed (poison ivy/bites/hives).   Ginkgo Biloba 120 MG CAPS Take 120 mg by mouth in the morning and at bedtime.    glipiZIDE (GLUCOTROL XL) 5 MG 24 hr tablet Take 1 tablet (5 mg total) by mouth daily with breakfast.   Glucose Blood (BLOOD GLUCOSE TEST STRIPS) STRP Use as directed to monitor FSBS 1x daily. Dx: E11.9. Please dispense as Accu-Chek Aviva   KRILL OIL PO Take 1,500 mg by mouth 2 (two) times daily.   Lancets MISC Use as directed to monitor FSBS 1x daily. Dx: E11.9. Please dispense as Accu-Chek Aviva   loperamide (IMODIUM A-D) 2 MG tablet Take 1 tablet (2 mg total) by mouth 4 (four) times daily as needed for diarrhea or loose stools.   metoprolol succinate (TOPROL-XL) 25 MG 24 hr tablet Take 1/2 (one-half) tablet by mouth once daily   Multiple Vitamin (MULTIVITAMIN WITH MINERALS) TABS tablet Take 1 tablet by mouth daily.   naftifine (NAFTIN) 1 % cream Apply topically 2 (two) times daily as needed.   rosuvastatin (CRESTOR) 5 MG tablet TAKE 1 TABLET BY MOUTH ONCE DAILY FOR CHOLESTEROL   Semaglutide, 1 MG/DOSE, 4 MG/3ML SOPN Inject 1 mg as directed once a week.   simethicone (MYLICON) 80 MG chewable tablet Chew 80 mg by mouth as needed for flatulence.    tiZANidine (ZANAFLEX) 4 MG tablet Take 1 tablet (4 mg total) by mouth every 8 (eight) hours as needed for muscle spasms.   torsemide (DEMADEX) 20 MG tablet Take 1 tablet (20 mg total) by mouth 3 (three) times a week.   UNABLE TO FIND Please dispense custom molded foot orthotics and heel spurs Dx: E11.30   No facility-administered encounter medications on file as of 05/10/2023.    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    Osteoarthritis    both knees, lower back, both shoulders; bone spurs to feet   Rectocele 06/25/2013   Posterior repair 11/06/13    Type II diabetes mellitus (HCC)    Ventral hernia  Past Surgical History:  Procedure Laterality Date   BREAST BIOPSY Left 04/2011   BREAST BIOPSY Left 06/22/13   BREAST BIOPSY Left 09/12/2013   Procedure: BREAST BIOPSY WITH NEEDLE LOCALIZATION;  Surgeon: Dalia Heading, MD;  Location: [facility];  Service: General;  Laterality: Left;   BREAST LUMPECTOMY Left 04/2011 X 2   BREAST SURGERY N/A    Phreesia 01/17/2020   CATARACT EXTRACTION W/ INTRAOCULAR LENS  IMPLANT, BILATERAL Bilateral    CATARACT EXTRACTION, BILATERAL     on different occassions    CESAREAN SECTION  1973   COLON SURGERY N/A    2014   DILATION AND CURETTAGE OF UTERUS  11/06/2013   HAND LIGAMENT RECONSTRUCTION Right ~ 2010   HYSTEROSCOPY WITH D & C N/A 11/06/2013   Procedure: DILATATION AND CURETTAGE /HYSTEROSCOPY;  Surgeon: Tilda Burrow, MD;  Location: [facility];  Service: Gynecology;  Laterality: N/A;   INSERT / REPLACE / REMOVE PACEMAKER  06/22/2017   LUNG LOBECTOMY Right 1988   Fungal Infection   PACEMAKER IMPLANT N/A 06/22/2017   Procedure: PACEMAKER IMPLANT;  Surgeon: Duke Salvia, MD;  Location: [facility] INVASIVE CV LAB;  Service: Cardiovascular;  Laterality: N/A;   PARTIAL MASTECTOMY WITH NEEDLE LOCALIZATION Right 01/02/2020   Procedure: RIGHT  PARTIAL MASTECTOMY AFTER NEEDLE LOCALIZATION;  Surgeon: Franky Macho, MD;  Location: [facility];  Service: General;  Laterality: Right;   POLYPECTOMY N/A 11/06/2013   Procedure: POLYPECTOMY (REMOVAL ENDOMETRIAL POLYP);  Surgeon: Tilda Burrow, MD;  Location: [facility];  Service: Gynecology;  Laterality: N/A;   RECTOCELE REPAIR N/A 11/06/2013   Procedure: POSTERIOR REPAIR (RECTOCELE);  Surgeon: Tilda Burrow, MD;  Location: [facility];  Service: Gynecology;  Laterality: N/A;   SHOULDER ARTHROSCOPY WITH OPEN ROTATOR CUFF REPAIR Left 07/26/2012   TONSILLECTOMY  1949   TUBAL LIGATION     Family History  Problem Relation Age of Onset   Hypertension Mother    Heart disease Mother    Diabetes Mother    Kidney disease Mother        ESRD   Cancer Mother        Bladder   Hypertension Father    Hyperlipidemia Father    Heart disease Father    Diabetes Father    Cancer Father        Stomach Cancer   Diabetes Sister    Heart disease Sister    Alzheimer's disease Paternal Aunt    Stroke Maternal Grandfather    Cancer Paternal Grandfather        stomach   Cancer Maternal Uncle        prostate   Cancer Paternal Uncle        stomach   Social History   Socioeconomic History   Marital status: Married    Spouse name: Kandee Keen   Number of children: 2   Years of education: 12   Highest education level: Some college, no degree  Occupational History   Occupation: retired     Comment: Advice worker for [employer]  Tobacco Use   Smoking status: Never   Smokeless tobacco: Never  Vaping Use   Vaping status: Never Used  Substance and Sexual Activity   Alcohol use: Not Currently    Comment: Twice a year   Drug use: No   Sexual activity: Not Currently    Birth control/protection: Surgical  Other Topics Concern   Not on file  Social History Narrative   Not on file  Social Drivers of Corporate investment banker Strain: Low Risk  (05/10/2023)   Overall Financial Resource Strain (CARDIA)     Difficulty of Paying Living Expenses: Not hard at all  Food Insecurity: No Food Insecurity (05/10/2023)   Hunger Vital Sign    Worried About Running Out of Food in the Last Year: Never true    Ran Out of Food in the Last Year: Never true  Transportation Needs: No Transportation Needs (05/10/2023)   PRAPARE - Administrator, Civil Service (Medical): No    Lack of Transportation (Non-Medical): No  Physical Activity: Inactive (05/10/2023)   Exercise Vital Sign    Days of Exercise per Week: 0 days    Minutes of Exercise per Session: 0 min  Stress: No Stress Concern Present (05/10/2023)   [employer] of Occupational Health - Occupational Stress Questionnaire    Feeling of Stress : Not at all  Social Connections: Socially Integrated (05/10/2023)   Social Connection and Isolation Panel [NHANES]    Frequency of Communication with Friends and Family: More than three times a week    Frequency of Social Gatherings with Friends and Family: More than three times a week    Attends Religious Services: More than 4 times per year    Active Member of [employer] or Organizations: Yes    Attends Engineer, structural: More than 4 times per year    Marital Status: Married    Tobacco Counseling Counseling given: Yes   Clinical Intake:  Pre-visit preparation completed: Yes  Pain : No/denies pain     BMI - recorded: 35.88 Nutritional Status: BMI > 30  Obese Nutritional Risks: None Diabetes: Yes CBG done?: No (telehealth visit.) Did pt. bring in CBG monitor from home?: No  How often do you need to have someone help you when you read instructions, pamphlets, or other written materials from your doctor or pharmacy?: 1 - Never (only if print is too small for her to read.)  Interpreter Needed?: No  Information entered by ::  W. CMA   Activities of Daily Living    05/10/2023   11:02 AM  In your present state of health, do you have any difficulty performing the following  activities:  Hearing? 1  Comment wears hearing aids  Vision? 0  Difficulty concentrating or making decisions? 0  Walking or climbing stairs? 0  Dressing or bathing? 0  Doing errands, shopping? 0  Preparing Food and eating ? N  Using the Toilet? N  In the past six months, have you accidently leaked urine? N  Do you have problems with loss of bowel control? N  Managing your Medications? N  Managing your Finances? N  Housekeeping or managing your Housekeeping? N    Patient Care Team: Anabel Halon, MD as PCP - General (Internal Medicine) Jonelle Sidle, MD as PCP - Cardiology (Cardiology) Mealor, Roberts Gaudy, MD as PCP - Electrophysiology (Cardiology) Erroll Luna, [facility] (Inactive) as Pharmacist (Pharmacist)  Indicate any recent Medical Services you may have received from other than [facility] providers in the past year (date may be approximate).     Assessment:   This is a routine wellness examination for Julie Clay.  Hearing/Vision screen Hearing Screening - Comments:: Patient wears hearing aids. UTD with exams. Sees [place] [employer]  Vision Screening - Comments:: Wears rx glasses - up to date with routine eye exams  Patient sees Dr. Daisy Lazar w/ [facility] office.  Goals Addressed             This Visit's Progress    Patient Stated       Remain active and healthy       Depression Screen    05/10/2023   11:04 AM 12/29/2022   10:56 AM 09/03/2022    9:02 AM 08/19/2022    9:30 AM 06/25/2022   10:08 AM 04/26/2022    9:01 AM 03/29/2022    4:25 PM  PHQ 2/9 Scores  PHQ - 2 Score 0 0 0 0 0 1 0  PHQ- 9 Score 3     3 4     Fall Risk    05/10/2023   11:01 AM 12/29/2022   10:56 AM 09/03/2022    9:02 AM 08/19/2022    9:30 AM 06/25/2022   10:08 AM  Fall Risk   Falls in the past year? 0 1 0 0 0  Number falls in past yr: 0 1 0 0 0  Injury with Fall? 0 0 0 0 0  Risk for fall due to : Impaired balance/gait;Impaired mobility Impaired  balance/gait;Impaired mobility     Follow up Education provided;Falls prevention discussed        MEDICARE RISK AT HOME: Medicare Risk at Home Any stairs in or around the home?: Yes If so, are there any without handrails?: No Home free of loose throw rugs in walkways, pet beds, electrical cords, etc?: No Adequate lighting in your home to reduce risk of falls?: Yes Life alert?: No Use of a cane, walker or w/c?: Yes (patient doesnt use walker to walk but she keeps it beside her bed for when she first gets up to ensure she has her balance) Grab bars in the bathroom?: Yes Shower chair or bench in shower?: Yes Elevated toilet seat or a handicapped toilet?: Yes  TIMED UP AND GO:  Was the test performed?  No    Cognitive Function:        05/10/2023   10:57 AM 04/21/2021   10:49 AM 04/09/2020    9:43 AM  6CIT Screen  What Year? 0 points 0 points 0 points  What month? 0 points 0 points 0 points  What time? 0 points 0 points 0 points  Count back from 20 0 points 0 points 0 points  Months in reverse 0 points 0 points 0 points  Repeat phrase 4 points 0 points 0 points  Total Score 4 points 0 points 0 points    Immunizations Immunization History  Administered Date(s) Administered   Fluad Quad(high Dose 65+) 12/26/2018, 01/18/2020, 01/21/2021, 12/23/2021   Fluad Trivalent(High Dose 65+) 12/29/2022   Hep A / Hep B 05/26/2018, 06/26/2018, 11/22/2018   Influenza Split 01/14/2012   Influenza,inj,Quad PF,6+ Mos 02/19/2013, 02/17/2015, 01/27/2016, 01/14/2017, 02/09/2018   PFIZER(Purple Top)SARS-COV-2 Vaccination 06/24/2019, 07/15/2019, 04/09/2020   PNEUMOCOCCAL CONJUGATE-20 06/25/2022   Pneumococcal Conjugate-13 08/02/2014   Zoster, Live 04/05/2014    TDAP status: Up to date  Flu Vaccine status: Up to date  Pneumococcal vaccine status: Up to date  Covid-19 vaccine status: Declined, Education has been provided regarding the importance of this vaccine but patient still declined.  Advised may receive this vaccine at local pharmacy or Health Dept.or vaccine clinic. Aware to provide a copy of the vaccination record if obtained from local pharmacy or Health Dept. Verbalized acceptance and understanding.  Qualifies for Shingles Vaccine? Yes   Zostavax completed Yes   Shingrix Completed?: No.    Education has been provided regarding  the importance of this vaccine. Patient has been advised to call insurance company to determine out of pocket expense if they have not yet received this vaccine. Advised may also receive vaccine at local pharmacy or Health Dept. Verbalized acceptance and understanding.  Screening Tests Health Maintenance  Topic Date Due   Zoster Vaccines- Shingrix (1 of 2) 08[birth year]24/1961   COVID-19 Vaccine (4 - 2024-25 season) 12/19/2022   Medicare Annual Wellness (AWV)  04/27/2023   DEXA SCAN  04/19/2048 (Originally 08/25/2007)   HEMOGLOBIN A1C  06/23/2023   Diabetic kidney evaluation - Urine ACR  06/25/2023   FOOT EXAM  06/25/2023   Diabetic kidney evaluation - eGFR measurement  12/24/2023   OPHTHALMOLOGY EXAM  01/06/2024   Pneumonia Vaccine 53+ Years old  Completed   INFLUENZA VACCINE  Completed   HPV VACCINES  Aged Out   DTaP/Tdap/Td  Discontinued   Hepatitis C Screening  Discontinued    Health Maintenance  Health Maintenance Due  Topic Date Due   Zoster Vaccines- Shingrix (1 of 2) 08/24/1961   COVID-19 Vaccine (4 - 2024-25 season) 12/19/2022   Medicare Annual Wellness (AWV)  04/27/2023    Colorectal cancer screening: No longer required.   Mammogram status: Completed 12/08/2022. Repeat every year  Bone Density Screening: Not age appropriate for this patient.   Lung Cancer Screening: (Low Dose CT Chest recommended if Age 22-80 years, 20 pack-year currently smoking OR have quit w/in 15years.) does not qualify.   Lung Cancer Screening Referral: na  Additional Screening:  Hepatitis C Screening: does not qualify; Completed   Vision Screening:  Recommended annual ophthalmology exams for early detection of glaucoma and other disorders of the eye. Is the patient up to date with their annual eye exam?  Yes  Who is the provider or what is the name of the office in which the patient attends annual eye exams? Patient sees Dr. Daisy Lazar w/ [facility] [place] [place] office.  If pt is not established with a provider, would they like to be referred to a provider to establish care? No .   Dental Screening: Recommended annual dental exams for proper oral hygiene  Diabetic Foot Exam: Diabetic Foot Exam: Completed 07/15/2022  Community Resource Referral / Chronic Care Management: CRR required this visit?  No   CCM required this visit?  No     Plan:     I have personally reviewed and noted the following in the patient's chart:   Medical and social history Use of alcohol, tobacco or illicit drugs  Current medications and supplements including opioid prescriptions. Patient is not currently taking opioid prescriptions. Functional ability and status Nutritional status Physical activity Advanced directives List of other physicians Hospitalizations, surgeries, and ER visits in previous 12 months Vitals Screenings to include cognitive, depression, and falls Referrals and appointments  In addition, I have reviewed and discussed with patient certain preventive protocols, quality metrics, and best practice recommendations. A written personalized care plan for preventive services as well as general preventive health recommendations were provided to patient.     Jordan Hawks Myliyah Rebuck, CMA   05/10/2023   After Visit Summary: (Mail) Due to this being a telephonic visit, the after visit summary with patients personalized plan was offered to patient via mail

## 2023-06-01 ENCOUNTER — Encounter: Payer: Self-pay | Admitting: Cardiology

## 2023-06-01 ENCOUNTER — Ambulatory Visit: Payer: Medicare PPO | Attending: Cardiology | Admitting: Cardiology

## 2023-06-01 VITALS — BP 130/58 | HR 73 | Ht 65.25 in | Wt 219.0 lb

## 2023-06-01 DIAGNOSIS — Z950 Presence of cardiac pacemaker: Secondary | ICD-10-CM | POA: Diagnosis not present

## 2023-06-01 DIAGNOSIS — I5032 Chronic diastolic (congestive) heart failure: Secondary | ICD-10-CM

## 2023-06-01 NOTE — Patient Instructions (Signed)
Medication Instructions:  Your physician recommends that you continue on your current medications as directed. Please refer to the Current Medication list given to you today.   Labwork: None today  Testing/Procedures: None today  Follow-Up: 1 year  Any Other Special Instructions Will Be Listed Below (If Applicable).  If you need a refill on your cardiac medications before your next appointment, please call your pharmacy.

## 2023-06-01 NOTE — Progress Notes (Signed)
    Cardiology Office Note  Date: 06/01/2023   ID: Julie Clay, DOB 05/08/1940, MRN [id number]  History of Present Illness: Julie Clay is an 83 y.o. female last seen in August 2024.  She is here today with her husband for a follow-up visit.  Overall reports no major change, stable NYHA class II dyspnea on exertion, no fluid retention or chest pain.  I reviewed her medications.  Current regimen includes Farxiga, Toprol-XL, Crestor, and Demadex.  I reviewed her interval lab work which is noted below.  St. Jude pacemaker in place, now followed with Dr. Nelly Laurence.  Device check in December 2024 indicated normal function.  I reviewed her ECG from that time as well.  Physical Exam: VS:  BP (!) 130/58 (BP Location: Right Arm, Patient Position: Sitting, Cuff Size: Large)   Pulse 73   Ht 5' 5.25" (1.657 m)   Wt 219 lb (99.3 kg)   SpO2 97%   BMI 36.16 kg/m , BMI Body mass index is 36.16 kg/m.  Wt Readings from Last 3 Encounters:  06/01/23 219 lb (99.3 kg)  05/10/23 215 lb 9.6 oz (97.8 kg)  03/25/23 221 lb (100.2 kg)    General: Patient appears comfortable at rest. HEENT: Conjunctiva and lids normal. Lungs: Clear to auscultation, nonlabored breathing at rest. Cardiac: Regular rate and rhythm, no S3 or significant systolic murmur, no pericardial rub. Extremities: No pitting edema.  ECG:  An ECG dated 03/25/2023 was personally reviewed today and demonstrated:  Ventricular paced rhythm.  Labwork: 06/18/2022: TSH 2.980 12/24/2022: ALT 14; AST 19; BUN 17; Creatinine, Ser 0.94; Hemoglobin 15.1; Platelets 255; Potassium 4.5; Sodium 143     Component Value Date/Time   CHOL 170 06/18/2022 1140   TRIG 280 (H) 06/18/2022 1140   HDL 47 06/18/2022 1140   CHOLHDL 3.6 06/18/2022 1140   CHOLHDL 2.7 04/08/2020 0925   VLDL 43 (H) 09/20/2016 1015   LDLCALC 78 06/18/2022 1140   LDLCALC 53 04/08/2020 0925   LDLDIRECT 102 (H) 12/09/2011 1035   Other Studies Reviewed Today:  No interval cardiac  testing for review today.  Assessment and Plan:  1.  HFpEF, LVEF 60 to 65% with grade 1 diastolic dysfunction and normal estimated RVSP by echocardiogram in December 2023.  She remains clinically stable with NYHA class II dyspnea and no fluid retention.  Continue Demadex and [place].   2.  St. Jude pacemaker in place with history of symptomatic second-degree type II heart block.  She is following with Dr. Nelly Laurence.  Device interrogation showed normal function in December 2024.   3.  Chronic dyspnea on exertion, likely multifactorial.  Recent coronary CTA did not demonstrate obstructive CAD as etiology and no substantial change in cardiac structural assessment by echocardiogram.    4.  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  She denies any active angina.  Continue Crestor.  Disposition:  Follow up  1 year.  Signed, Jonelle Sidle, M.D., F.A.C.C. [facility]

## 2023-06-06 DIAGNOSIS — E119 Type 2 diabetes mellitus without complications: Secondary | ICD-10-CM | POA: Diagnosis not present

## 2023-06-16 ENCOUNTER — Ambulatory Visit (INDEPENDENT_AMBULATORY_CARE_PROVIDER_SITE_OTHER): Payer: Medicare PPO | Admitting: Gastroenterology

## 2023-06-16 ENCOUNTER — Other Ambulatory Visit: Payer: Self-pay | Admitting: Cardiology

## 2023-06-22 ENCOUNTER — Ambulatory Visit (INDEPENDENT_AMBULATORY_CARE_PROVIDER_SITE_OTHER): Payer: Medicare Other

## 2023-06-22 DIAGNOSIS — I441 Atrioventricular block, second degree: Secondary | ICD-10-CM | POA: Diagnosis not present

## 2023-06-24 LAB — CUP PACEART REMOTE DEVICE CHECK
Battery Remaining Longevity: 36 mo
Battery Remaining Percentage: 36 %
Battery Voltage: 2.96 V
Brady Statistic AP VP Percent: 2.1 %
Brady Statistic AP VS Percent: 1 %
Brady Statistic AS VP Percent: 98 %
Brady Statistic AS VS Percent: 1 %
Brady Statistic RA Percent Paced: 2 %
Brady Statistic RV Percent Paced: 99 %
Date Time Interrogation Session: 20250305020017
Implantable Lead Connection Status: 753985
Implantable Lead Connection Status: 753985
Implantable Lead Implant Date: 20190306
Implantable Lead Implant Date: 20190306
Implantable Lead Location: 753859
Implantable Lead Location: 753860
Implantable Lead Model: 5076
Implantable Lead Model: 5076
Implantable Pulse Generator Implant Date: 20190306
Lead Channel Impedance Value: 340 Ohm
Lead Channel Impedance Value: 410 Ohm
Lead Channel Pacing Threshold Amplitude: 0.5 V
Lead Channel Pacing Threshold Amplitude: 0.75 V
Lead Channel Pacing Threshold Pulse Width: 0.5 ms
Lead Channel Pacing Threshold Pulse Width: 0.5 ms
Lead Channel Sensing Intrinsic Amplitude: 3.6 mV
Lead Channel Sensing Intrinsic Amplitude: 5.4 mV
Lead Channel Setting Pacing Amplitude: 2 V
Lead Channel Setting Pacing Amplitude: 2.5 V
Lead Channel Setting Pacing Pulse Width: 0.5 ms
Lead Channel Setting Sensing Sensitivity: 4 mV
Pulse Gen Model: 2272
Pulse Gen Serial Number: 9001027

## 2023-06-28 ENCOUNTER — Encounter: Payer: Self-pay | Admitting: Internal Medicine

## 2023-06-28 ENCOUNTER — Ambulatory Visit (INDEPENDENT_AMBULATORY_CARE_PROVIDER_SITE_OTHER): Payer: Medicare PPO | Admitting: Internal Medicine

## 2023-06-28 VITALS — BP 128/74 | HR 70 | Ht 65.0 in | Wt 219.0 lb

## 2023-06-28 DIAGNOSIS — E1143 Type 2 diabetes mellitus with diabetic autonomic (poly)neuropathy: Secondary | ICD-10-CM

## 2023-06-28 DIAGNOSIS — E559 Vitamin D deficiency, unspecified: Secondary | ICD-10-CM

## 2023-06-28 DIAGNOSIS — E782 Mixed hyperlipidemia: Secondary | ICD-10-CM | POA: Diagnosis not present

## 2023-06-28 DIAGNOSIS — E1169 Type 2 diabetes mellitus with other specified complication: Secondary | ICD-10-CM | POA: Diagnosis not present

## 2023-06-28 DIAGNOSIS — I441 Atrioventricular block, second degree: Secondary | ICD-10-CM

## 2023-06-28 DIAGNOSIS — F411 Generalized anxiety disorder: Secondary | ICD-10-CM

## 2023-06-28 DIAGNOSIS — I5032 Chronic diastolic (congestive) heart failure: Secondary | ICD-10-CM

## 2023-06-28 DIAGNOSIS — Z7984 Long term (current) use of oral hypoglycemic drugs: Secondary | ICD-10-CM

## 2023-06-28 DIAGNOSIS — Z0001 Encounter for general adult medical examination with abnormal findings: Secondary | ICD-10-CM

## 2023-06-28 DIAGNOSIS — J0100 Acute maxillary sinusitis, unspecified: Secondary | ICD-10-CM

## 2023-06-28 DIAGNOSIS — I10 Essential (primary) hypertension: Secondary | ICD-10-CM

## 2023-06-28 MED ORDER — SEMAGLUTIDE (1 MG/DOSE) 4 MG/3ML ~~LOC~~ SOPN
1.0000 mg | PEN_INJECTOR | SUBCUTANEOUS | 1 refills | Status: DC
Start: 2023-06-28 — End: 2023-08-15

## 2023-06-28 MED ORDER — DAPAGLIFLOZIN PROPANEDIOL 10 MG PO TABS: 10.0000 mg | ORAL_TABLET | Freq: Every day | ORAL | 3 refills | Status: AC

## 2023-06-28 MED ORDER — AMOXICILLIN-POT CLAVULANATE 875-125 MG PO TABS
1.0000 | ORAL_TABLET | Freq: Two times a day (BID) | ORAL | 0 refills | Status: DC
Start: 2023-06-28 — End: 2024-01-03

## 2023-06-28 MED ORDER — GLIPIZIDE ER 5 MG PO TB24
5.0000 mg | ORAL_TABLET | Freq: Every day | ORAL | 1 refills | Status: DC
Start: 2023-06-28 — End: 2024-01-03

## 2023-06-28 NOTE — Assessment & Plan Note (Signed)
 BP Readings from Last 1 Encounters:  06/28/23 128/74   Well-controlled with Metoprolol (for CHF) Counseled for compliance with the medications Advised DASH diet and moderate exercise/walking as tolerated

## 2023-06-28 NOTE — Assessment & Plan Note (Signed)
 Lab Results  Component Value Date   HGBA1C 6.4 (H) 12/24/2022   Well-controlled On glipizide 5 mg QD, Farxiga 10 mg QD and Ozempic 1 mg qw Advised to follow diabetic diet On statin F/u CMP and lipid panel Diabetic eye exam: Advised to follow up with Ophthalmology for diabetic eye exam  Has diabetic neuropathy, intermittent numbness of feet - has diabetic shoes

## 2023-06-28 NOTE — Patient Instructions (Addendum)
 Please start taking Augmentin as prescribed.  Please continue to take medications as prescribed.  Please continue to follow low carb diet and ambulate as tolerated.

## 2023-06-28 NOTE — Assessment & Plan Note (Signed)
Appears euvolemic currently Takes Demadex every other day On Farxiga and metoprolol Followed by Cardiology

## 2023-06-28 NOTE — Progress Notes (Unsigned)
 Established Patient Office Visit  Subjective:  Patient ID: Julie Clay, female    DOB: 1941-04-14  Age: 83 y.o. MRN: [id number]  CC:  Chief Complaint  Patient presents with   Annual Exam    Cpe and 6 month f/u, reports sx of sinus pressure.     HPI Julie Clay is a 83 y.o. female with past medical history of HTN, asthma, type II DM with neuropathy, GERD, chronic diarrhea, breast ca. and GAD who presents for f/u of her chronic medical conditions.  Type II DM: She has been taking glipizide, Farxiga and Ozempic. Her HbA1c is 6.4 now, stable. Her diarrhea has slightly improved since stopping metformin.  Denies any polyuria or polydipsia currently.  She takes Crestor for HLD.  She has history of Mobitz 2 second-degree heart block, s/p pacemaker placement and HFpEF. BP is well-controlled. Takes medications regularly. Patient denies headache, dizziness, chest pain, dyspnea or palpitations.  IBS-D: She has chronic diarrhea, takes Imodium as needed for diarrhea.  Denies any melena or hematochezia.  Past Medical History:  Diagnosis Date   2nd degree AV block 06/22/2017   Arthritis    Asthma    Bone spur    Breast cancer, left breast (HCC)    2012   Breast cancer, right breast (HCC)    2021   Chronic bronchitis (HCC)    COPD (chronic obstructive pulmonary disease) (HCC)    Diabetes mellitus without complication (HCC)    Phreesia 04/08/2020   Diastolic heart failure (HCC)    Essential hypertension    Fatty liver 2013   GERD (gastroesophageal reflux disease)    HSV (herpes simplex virus) infection    Hyperlipidemia    IBS (irritable bowel syndrome)    Lung nodule seen on imaging study 2013   Lymphedema    Left arm   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    Osteoarthritis    both knees, lower back, both shoulders; bone spurs to feet   Rectocele 06/25/2013   Posterior repair 11/06/13    Type II diabetes mellitus (HCC)    Ventral hernia     Past Surgical History:   Procedure Laterality Date   BREAST BIOPSY Left 04/2011   BREAST BIOPSY Left 06/22/13   BREAST BIOPSY Left 09/12/2013   Procedure: BREAST BIOPSY WITH NEEDLE LOCALIZATION;  Surgeon: Dalia Heading, MD;  Location: [facility];  Service: General;  Laterality: Left;   BREAST LUMPECTOMY Left 04/2011 X 2   BREAST SURGERY N/A    Phreesia 01/17/2020   CATARACT EXTRACTION W/ INTRAOCULAR LENS  IMPLANT, BILATERAL Bilateral    CATARACT EXTRACTION, BILATERAL     on different occassions    CESAREAN SECTION  1973   COLON SURGERY N/A    2014   DILATION AND CURETTAGE OF UTERUS  11/06/2013   HAND LIGAMENT RECONSTRUCTION Right ~ 2010   HYSTEROSCOPY WITH D & C N/A 11/06/2013   Procedure: DILATATION AND CURETTAGE /HYSTEROSCOPY;  Surgeon: Tilda Burrow, MD;  Location: [facility];  Service: Gynecology;  Laterality: N/A;   INSERT / REPLACE / REMOVE PACEMAKER  06/22/2017   LUNG LOBECTOMY Right 1988   Fungal Infection   PACEMAKER IMPLANT N/A 06/22/2017   Procedure: PACEMAKER IMPLANT;  Surgeon: Duke Salvia, MD;  Location[facility];  Service: Cardiovascular;  Laterality: N/A;   PARTIAL MASTECTOMY WITH NEEDLE LOCALIZATION Right 01/02/2020   Procedure: RIGHT PARTIAL MASTECTOMY AFTER NEEDLE LOCALIZATION;  Surgeon: Franky Macho, MD;  Location: [facility];  Service: General;  Laterality: Right;   POLYPECTOMY N/A 11/06/2013   Procedure: POLYPECTOMY (REMOVAL ENDOMETRIAL POLYP);  Surgeon: Tilda Burrow, MD;  Location: [facility];  Service: Gynecology;  Laterality: N/A;   RECTOCELE REPAIR N/A 11/06/2013   Procedure: POSTERIOR REPAIR (RECTOCELE);  Surgeon: Tilda Burrow, MD;  Location: [facility];  Service: Gynecology;  Laterality: N/A;   SHOULDER ARTHROSCOPY WITH OPEN ROTATOR CUFF REPAIR Left 07/26/2012   TONSILLECTOMY  1949   TUBAL LIGATION      Family History  Problem Relation Age of Onset   Hypertension Mother    Heart disease Mother    Diabetes Mother    Kidney disease Mother        ESRD   Cancer Mother         Bladder   Hypertension Father    Hyperlipidemia Father    Heart disease Father    Diabetes Father    Cancer Father        Stomach Cancer   Diabetes Sister    Heart disease Sister    Alzheimer's disease Paternal Aunt    Stroke Maternal Grandfather    Cancer Paternal Grandfather        stomach   Cancer Maternal Uncle        prostate   Cancer Paternal Uncle        stomach    Social History   Socioeconomic History   Marital status: Married    Spouse name: Kandee Keen   Number of children: 2   Years of education: 12   Highest education level: Some college, no degree  Occupational History   Occupation: retired     Comment: Advice worker for [employer]  Tobacco Use   Smoking status: Never   Smokeless tobacco: Never  Vaping Use   Vaping status: Never Used  Substance and Sexual Activity   Alcohol use: Not Currently    Comment: Twice a year   Drug use: No   Sexual activity: Not Currently    Birth control/protection: Surgical  Other Topics Concern   Not on file  Social History Narrative   Not on file   Social Drivers of Health   Financial Resource Strain: Low Risk  (05/10/2023)   Overall Financial Resource Strain (CARDIA)    Difficulty of Paying Living Expenses: Not hard at all  Food Insecurity: No Food Insecurity (05/10/2023)   Hunger Vital Sign    Worried About Running Out of Food in the Last Year: Never true    Ran Out of Food in the Last Year: Never true  Transportation Needs: No Transportation Needs (05/10/2023)   PRAPARE - Administrator, Civil Service (Medical): No    Lack of Transportation (Non-Medical): No  Physical Activity: Inactive (05/10/2023)   Exercise Vital Sign    Days of Exercise per Week: 0 days    Minutes of Exercise per Session: 0 min  Stress: No Stress Concern Present (05/10/2023)   [employer] of Occupational Health - Occupational Stress Questionnaire    Feeling of Stress : Not at all  Social Connections: Socially Integrated  (05/10/2023)   Social Connection and Isolation Panel [NHANES]    Frequency of Communication with Friends and Family: More than three times a week    Frequency of Social Gatherings with Friends and Family: More than three times a week    Attends Religious Services: More than 4 times per year    Active Member of [employer] or Organizations:  Yes    Attends Club or Organization Meetings: More than 4 times per year    Marital Status: Married  Catering manager Violence: Not At Risk (05/10/2023)   Humiliation, Afraid, Rape, and Kick questionnaire    Fear of Current or Ex-Partner: No    Emotionally Abused: No    Physically Abused: No    Sexually Abused: No    Outpatient Medications Prior to Visit  Medication Sig Dispense Refill   acetaminophen (TYLENOL) 500 MG tablet Take 1,000 mg by mouth every 6 (six) hours as needed for moderate pain.     albuterol (VENTOLIN HFA) 108 (90 Base) MCG/ACT inhaler Inhale 2 puffs into the lungs every 6 (six) hours as needed for wheezing or shortness of breath. 8 g 0   Aloe Vera Leaf POWD Take 800 mg by mouth 2 (two) times daily.     Ascorbic Acid (VITAMIN C) 1000 MG tablet Take 1,000 mg by mouth 2 (two) times daily.     b complex vitamins tablet Take 1 tablet by mouth 2 (two) times daily.      Blood Glucose Monitoring Suppl (BLOOD GLUCOSE SYSTEM PAK) KIT Use as directed to monitor FSBS 1x daily. Dx: E11.9. Please dispense as Accu-Chek Aviva 1 each 1   Calcium Polycarbophil (FIBER-CAPS PO) Take by mouth daily at 6 (six) AM. 3 qam and Two po QHS     Cholecalciferol (VITAMIN D3) 2000 UNITS TABS Take 2,000 Units by mouth 2 (two) times daily.     clotrimazole-betamethasone (LOTRISONE) cream Apply 1 Application topically daily. 30 g 0   dapagliflozin propanediol (FARXIGA) 10 MG TABS tablet Take 1 tablet (10 mg total) by mouth daily before breakfast. 90 tablet 3   diclofenac Sodium (VOLTAREN) 1 % GEL Apply 2 g topically 4 (four) times daily. (Patient taking differently: Apply 2 g  topically 4 (four) times daily. prn) 100 g 1   fluocinonide cream (LIDEX) 0.05 % Apply 1 Application topically 2 (two) times daily as needed (poison ivy/bites/hives). 30 g 11   Ginkgo Biloba 120 MG CAPS Take 120 mg by mouth in the morning and at bedtime.      glipiZIDE (GLUCOTROL XL) 5 MG 24 hr tablet Take 1 tablet (5 mg total) by mouth daily with breakfast. 90 tablet 1   Glucose Blood (BLOOD GLUCOSE TEST STRIPS) STRP Use as directed to monitor FSBS 1x daily. Dx: E11.9. Please dispense as Accu-Chek Aviva 100 each 1   KRILL OIL PO Take 1,500 mg by mouth 2 (two) times daily.     Lancets MISC Use as directed to monitor FSBS 1x daily. Dx: E11.9. Please dispense as Accu-Chek Aviva 100 each 1   loperamide (IMODIUM A-D) 2 MG tablet Take 1 tablet (2 mg total) by mouth 4 (four) times daily as needed for diarrhea or loose stools. 30 tablet 1   metoprolol succinate (TOPROL-XL) 25 MG 24 hr tablet Take 1/2 (one-half) tablet by mouth once daily 45 tablet 3   Multiple Vitamin (MULTIVITAMIN WITH MINERALS) TABS tablet Take 1 tablet by mouth daily.     naftifine (NAFTIN) 1 % cream Apply topically 2 (two) times daily as needed. 60 g 2   rosuvastatin (CRESTOR) 5 MG tablet TAKE 1 TABLET BY MOUTH ONCE DAILY FOR CHOLESTEROL 90 tablet 0   Semaglutide, 1 MG/DOSE, 4 MG/3ML SOPN Inject 1 mg as directed once a week. 9 mL 1   simethicone (MYLICON) 80 MG chewable tablet Chew 80 mg by mouth as needed for flatulence.  tiZANidine (ZANAFLEX) 4 MG tablet Take 1 tablet (4 mg total) by mouth every 8 (eight) hours as needed for muscle spasms. 90 tablet 1   torsemide (DEMADEX) 20 MG tablet Take 1 tablet (20 mg total) by mouth 3 (three) times a week. 40 tablet 3   UNABLE TO FIND Please dispense custom molded foot orthotics and heel spurs Dx: E11.30 1 each 0   No facility-administered medications prior to visit.    Allergies  Allergen Reactions   Arimidex [Anastrozole] Other (See Comments)    Arthralgias and myalgias.  Improved  with 3 week hiatus from drug.  Categorical side effect of drug class.   Other Hives and Itching   Adhesive [Tape] Other (See Comments)    "red and blistered"   Lisinopril     Dry hacking cough and tickle   Tetanus Toxoids    Tetracyclines & Related Other (See Comments)    unknown    ROS Review of Systems  Constitutional:  Negative for chills and fever.  HENT:  Negative for congestion, sinus pressure, sinus pain and sore throat.   Eyes:  Negative for pain and discharge.  Respiratory:  Negative for cough and shortness of breath.   Cardiovascular:  Negative for chest pain and palpitations.  Gastrointestinal:  Positive for diarrhea. Negative for abdominal pain, blood in stool, nausea and vomiting.  Endocrine: Negative for polydipsia and polyuria.  Genitourinary:  Negative for dysuria and hematuria.  Musculoskeletal:  Negative for neck pain and neck stiffness.  Skin:  Positive for color change (Left face area mole). Negative for rash.  Neurological:  Negative for dizziness and weakness.  Psychiatric/Behavioral:  Negative for agitation and behavioral problems.       Objective:    Physical Exam Vitals reviewed.  Constitutional:      General: She is not in acute distress.    Appearance: She is obese. She is not diaphoretic.  HENT:     Head: Normocephalic and atraumatic.     Nose: No congestion.     Mouth/Throat:     Mouth: Mucous membranes are moist.     Pharynx: No posterior oropharyngeal erythema.  Eyes:     General: No scleral icterus.    Extraocular Movements: Extraocular movements intact.  Cardiovascular:     Rate and Rhythm: Normal rate and regular rhythm.     Pulses: Normal pulses.     Heart sounds: Normal heart sounds. No murmur heard. Pulmonary:     Breath sounds: Normal breath sounds. No wheezing or rales.  Musculoskeletal:     Cervical back: Neck supple. No tenderness.     Right lower leg: No edema.     Left lower leg: No edema.  Skin:    General: Skin is  warm.     Findings: Lesion (Brownish papule over left side of face and left upper arm - likely actinic keratoses) present. No rash.  Neurological:     General: No focal deficit present.     Mental Status: She is alert and oriented to person, place, and time.     Sensory: No sensory deficit.     Motor: No weakness.  Psychiatric:        Mood and Affect: Mood normal.        Behavior: Behavior normal.     BP 128/74   Pulse 70   Ht 5\' 5"  (1.651 m)   Wt 219 lb (99.3 kg)   SpO2 93%   BMI 36.44 kg/m  Wt Readings from Last  3 Encounters:  06/28/23 219 lb (99.3 kg)  06/01/23 219 lb (99.3 kg)  05/10/23 215 lb 9.6 oz (97.8 kg)    Lab Results  Component Value Date   TSH 2.980 06/18/2022   Lab Results  Component Value Date   WBC 9.1 12/24/2022   HGB 15.1 12/24/2022   HCT 48.5 (H) 12/24/2022   MCV 90 12/24/2022   PLT 255 12/24/2022   Lab Results  Component Value Date   NA 143 12/24/2022   K 4.5 12/24/2022   CO2 23 12/24/2022   GLUCOSE 103 (H) 12/24/2022   BUN 17 12/24/2022   CREATININE 0.94 12/24/2022   BILITOT 0.5 12/24/2022   ALKPHOS 69 12/24/2022   AST 19 12/24/2022   ALT 14 12/24/2022   PROT 6.6 12/24/2022   ALBUMIN 4.4 12/24/2022   CALCIUM 9.9 12/24/2022   ANIONGAP 9 09/01/2022   EGFR 61 12/24/2022   Lab Results  Component Value Date   CHOL 170 06/18/2022   Lab Results  Component Value Date   HDL 47 06/18/2022   Lab Results  Component Value Date   LDLCALC 78 06/18/2022   Lab Results  Component Value Date   TRIG 280 (H) 06/18/2022   Lab Results  Component Value Date   CHOLHDL 3.6 06/18/2022   Lab Results  Component Value Date   HGBA1C 6.4 (H) 12/24/2022      Assessment & Plan:   Problem List Items Addressed This Visit   None    No orders of the defined types were placed in this encounter.   Follow-up: No follow-ups on file.    Anabel Halon, MD

## 2023-06-29 NOTE — Assessment & Plan Note (Signed)
 Started empiric Augmentin for acute sinusitis Advised to use humidifier and/or vaporizer for nasal congestion Avoid using phenylephrine due to HTN Flonase or nasal saline spray as needed for nasal congestion

## 2023-06-29 NOTE — Assessment & Plan Note (Signed)
Physical exam as documented. Fasting blood tests today. Advised to get Shingrix vaccines at local pharmacy.

## 2023-06-29 NOTE — Assessment & Plan Note (Signed)
St. Jude pacemaker in place with history of symptomatic second-degree type II heart block.  She continues to follow with Dr. Graciela Husbands.

## 2023-06-29 NOTE — Assessment & Plan Note (Addendum)
 Has Ativan, takes occasionally for severe anxiety

## 2023-06-29 NOTE — Assessment & Plan Note (Signed)
 On Crestor 5 mg QD

## 2023-06-30 LAB — CMP14+EGFR
ALT: 13 IU/L (ref 0–32)
AST: 20 IU/L (ref 0–40)
Albumin: 4.3 g/dL (ref 3.8–4.8)
Alkaline Phosphatase: 70 IU/L (ref 44–121)
BUN/Creatinine Ratio: 18 (ref 12–28)
BUN: 14 mg/dL (ref 8–27)
Bilirubin Total: 0.4 mg/dL (ref 0.0–1.2)
CO2: 22 mmol/L (ref 20–29)
Calcium: 9.4 mg/dL (ref 8.7–10.3)
Chloride: 105 mmol/L (ref 96–106)
Creatinine, Ser: 0.78 mg/dL (ref 0.57–1.00)
Globulin, Total: 2.6 g/dL (ref 1.5–4.5)
Glucose: 83 mg/dL (ref 70–99)
Potassium: 4.2 mmol/L (ref 3.5–5.2)
Sodium: 143 mmol/L (ref 134–144)
Total Protein: 6.9 g/dL (ref 6.0–8.5)
eGFR: 77 mL/min/{1.73_m2} (ref >59)

## 2023-06-30 LAB — CBC WITH DIFFERENTIAL/PLATELET
Basophils Absolute: 0 10*3/uL (ref 0.0–0.2)
Basos: 0 %
EOS (ABSOLUTE): 0.3 10*3/uL (ref 0.0–0.4)
Eos: 3 %
Hematocrit: 47.3 % — ABNORMAL HIGH (ref 34.0–46.6)
Hemoglobin: 15.3 g/dL (ref 11.1–15.9)
Immature Grans (Abs): 0 10*3/uL (ref 0.0–0.1)
Immature Granulocytes: 0 %
Lymphocytes Absolute: 3 10*3/uL (ref 0.7–3.1)
Lymphs: 33 %
MCH: 28.1 pg (ref 26.6–33.0)
MCHC: 32.3 g/dL (ref 31.5–35.7)
MCV: 87 fL (ref 79–97)
Monocytes Absolute: 0.8 10*3/uL (ref 0.1–0.9)
Monocytes: 8 %
Neutrophils Absolute: 5.1 10*3/uL (ref 1.4–7.0)
Neutrophils: 56 %
Platelets: 262 10*3/uL (ref 150–450)
RBC: 5.44 x10E6/uL — ABNORMAL HIGH (ref 3.77–5.28)
RDW: 13.2 % (ref 11.7–15.4)
WBC: 9.3 10*3/uL (ref 3.4–10.8)

## 2023-06-30 LAB — LIPID PANEL
Chol/HDL Ratio: 3.3 ratio (ref 0.0–4.4)
Cholesterol, Total: 144 mg/dL (ref 100–199)
HDL: 44 mg/dL (ref >39)
LDL Chol Calc (NIH): 62 mg/dL (ref 0–99)
Triglycerides: 237 mg/dL — ABNORMAL HIGH (ref 0–149)
VLDL Cholesterol Cal: 38 mg/dL (ref 5–40)

## 2023-06-30 LAB — TSH: TSH: 2.43 u[IU]/mL (ref 0.450–4.500)

## 2023-06-30 LAB — VITAMIN D 25 HYDROXY (VIT D DEFICIENCY, FRACTURES): Vit D, 25-Hydroxy: 69.6 ng/mL (ref 30.0–100.0)

## 2023-06-30 LAB — HEMOGLOBIN A1C
Est. average glucose Bld gHb Est-mCnc: 126 mg/dL
Hgb A1c MFr Bld: 6 % — ABNORMAL HIGH (ref 4.8–5.6)

## 2023-06-30 LAB — MICROALBUMIN / CREATININE URINE RATIO: Creatinine, Urine: 58.7 mg/dL

## 2023-07-21 ENCOUNTER — Telehealth: Payer: Self-pay | Admitting: Internal Medicine

## 2023-07-22 DIAGNOSIS — M1712 Unilateral primary osteoarthritis, left knee: Secondary | ICD-10-CM | POA: Diagnosis not present

## 2023-08-05 NOTE — Progress Notes (Signed)
 Remote pacemaker transmission.

## 2023-08-05 NOTE — Addendum Note (Signed)
 Addended by: Lott Rouleau A on: 08/05/2023 03:36 PM   Modules accepted: Orders

## 2023-08-12 ENCOUNTER — Other Ambulatory Visit: Payer: Self-pay | Admitting: Internal Medicine

## 2023-08-12 DIAGNOSIS — T148XXA Other injury of unspecified body region, initial encounter: Secondary | ICD-10-CM

## 2023-08-15 ENCOUNTER — Telehealth: Payer: Self-pay | Admitting: Internal Medicine

## 2023-08-15 ENCOUNTER — Other Ambulatory Visit: Payer: Self-pay | Admitting: Internal Medicine

## 2023-08-15 DIAGNOSIS — E1143 Type 2 diabetes mellitus with diabetic autonomic (poly)neuropathy: Secondary | ICD-10-CM

## 2023-08-15 NOTE — Telephone Encounter (Signed)
 Copied from CRM [id number]. Topic: Clinical - Prescription Issue >> Aug 15, 2023 12:54 PM Geneva B wrote: Reason for CRM: [facility] to say that the patient would need a new rx fro ozempic  medication and the patient has gotten her last refill with them and do not want to wait to  the last minute please call patient

## 2023-08-16 NOTE — Telephone Encounter (Signed)
Pt informed

## 2023-08-31 ENCOUNTER — Other Ambulatory Visit (HOSPITAL_COMMUNITY): Payer: Self-pay

## 2023-08-31 ENCOUNTER — Telehealth: Payer: Self-pay | Admitting: Pharmacy Technician

## 2023-08-31 NOTE — Telephone Encounter (Signed)
 Pharmacy Patient Advocate Encounter  Received notification from [facility] that Prior Authorization for Dapagliflozin  Propanediol 10MG  tablets has been APPROVED from 08/01/2023 to 08/30/2024. Ran test claim, Copay is $60.00/90 DAY SUPPLY. This test claim was processed through [facility]- copay amounts may vary at other pharmacies due to pharmacy/plan contracts, or as the patient moves through the different stages of their insurance plan.   PA #/Case ID/Reference #: [id number]

## 2023-08-31 NOTE — Telephone Encounter (Signed)
 Pharmacy Patient Advocate Encounter   Received notification from CoverMyMeds that prior authorization for Dapagliflozin  Propanediol 10MG  tablets is required/requested.   Insurance verification completed.   The patient is insured through [employer] .   Per test claim: PA required; PA submitted to above mentioned insurance via CoverMyMeds Key/confirmation #/EOC [id number] Status is pending

## 2023-09-21 ENCOUNTER — Ambulatory Visit (INDEPENDENT_AMBULATORY_CARE_PROVIDER_SITE_OTHER): Payer: Medicare Other

## 2023-09-21 DIAGNOSIS — I441 Atrioventricular block, second degree: Secondary | ICD-10-CM

## 2023-09-21 LAB — CUP PACEART REMOTE DEVICE CHECK
Battery Remaining Longevity: 34 mo
Battery Remaining Percentage: 34 %
Battery Voltage: 2.96 V
Brady Statistic AP VP Percent: 3.8 %
Brady Statistic AP VS Percent: 1 %
Brady Statistic AS VP Percent: 96 %
Brady Statistic AS VS Percent: 1 %
Brady Statistic RA Percent Paced: 3.6 %
Brady Statistic RV Percent Paced: 99 %
Date Time Interrogation Session: 20250604020015
Implantable Lead Connection Status: 753985
Implantable Lead Connection Status: 753985
Implantable Lead Implant Date: 20190306
Implantable Lead Implant Date: 20190306
Implantable Lead Location: 753859
Implantable Lead Location: 753860
Implantable Lead Model: 5076
Implantable Lead Model: 5076
Implantable Pulse Generator Implant Date: 20190306
Lead Channel Impedance Value: 340 Ohm
Lead Channel Impedance Value: 410 Ohm
Lead Channel Pacing Threshold Amplitude: 0.5 V
Lead Channel Pacing Threshold Amplitude: 0.75 V
Lead Channel Pacing Threshold Pulse Width: 0.5 ms
Lead Channel Pacing Threshold Pulse Width: 0.5 ms
Lead Channel Sensing Intrinsic Amplitude: 3.2 mV
Lead Channel Sensing Intrinsic Amplitude: 5.4 mV
Lead Channel Setting Pacing Amplitude: 2 V
Lead Channel Setting Pacing Amplitude: 2.5 V
Lead Channel Setting Pacing Pulse Width: 0.5 ms
Lead Channel Setting Sensing Sensitivity: 4 mV
Pulse Gen Model: 2272
Pulse Gen Serial Number: 9001027

## 2023-09-22 ENCOUNTER — Ambulatory Visit: Payer: Self-pay | Admitting: Cardiovascular Disease

## 2023-10-18 ENCOUNTER — Other Ambulatory Visit: Payer: Self-pay | Admitting: Internal Medicine

## 2023-11-14 NOTE — Progress Notes (Signed)
 Remote pacemaker transmission.

## 2023-11-23 ENCOUNTER — Other Ambulatory Visit (HOSPITAL_COMMUNITY): Payer: Self-pay | Admitting: Internal Medicine

## 2023-11-23 DIAGNOSIS — Z1231 Encounter for screening mammogram for malignant neoplasm of breast: Secondary | ICD-10-CM

## 2023-12-12 ENCOUNTER — Ambulatory Visit (HOSPITAL_COMMUNITY)
Admission: RE | Admit: 2023-12-12 | Discharge: 2023-12-12 | Disposition: A | Discharge status: Home / Self Care | Source: Ambulatory Visit | Attending: Internal Medicine | Admitting: Internal Medicine

## 2023-12-12 DIAGNOSIS — Z1231 Encounter for screening mammogram for malignant neoplasm of breast: Secondary | ICD-10-CM | POA: Insufficient documentation

## 2023-12-21 ENCOUNTER — Ambulatory Visit (INDEPENDENT_AMBULATORY_CARE_PROVIDER_SITE_OTHER): Payer: Medicare Other

## 2023-12-21 DIAGNOSIS — I5032 Chronic diastolic (congestive) heart failure: Secondary | ICD-10-CM

## 2023-12-22 ENCOUNTER — Ambulatory Visit: Payer: Self-pay | Admitting: Cardiovascular Disease

## 2023-12-22 LAB — CUP PACEART REMOTE DEVICE CHECK
Battery Remaining Longevity: 30 mo
Battery Remaining Percentage: 31 %
Battery Voltage: 2.95 V
Brady Statistic AP VP Percent: 4.5 %
Brady Statistic AP VS Percent: 1 %
Brady Statistic AS VP Percent: 95 %
Brady Statistic AS VS Percent: 1 %
Brady Statistic RA Percent Paced: 4.3 %
Brady Statistic RV Percent Paced: 99 %
Date Time Interrogation Session: 20250903020016
Implantable Lead Connection Status: 753985
Implantable Lead Connection Status: 753985
Implantable Lead Implant Date: 20190306
Implantable Lead Implant Date: 20190306
Implantable Lead Location: 753859
Implantable Lead Location: 753860
Implantable Lead Model: 5076
Implantable Lead Model: 5076
Implantable Pulse Generator Implant Date: 20190306
Lead Channel Impedance Value: 350 Ohm
Lead Channel Impedance Value: 410 Ohm
Lead Channel Pacing Threshold Amplitude: 0.5 V
Lead Channel Pacing Threshold Amplitude: 0.75 V
Lead Channel Pacing Threshold Pulse Width: 0.5 ms
Lead Channel Pacing Threshold Pulse Width: 0.5 ms
Lead Channel Sensing Intrinsic Amplitude: 3.5 mV
Lead Channel Sensing Intrinsic Amplitude: 6.3 mV
Lead Channel Setting Pacing Amplitude: 2 V
Lead Channel Setting Pacing Amplitude: 2.5 V
Lead Channel Setting Pacing Pulse Width: 0.5 ms
Lead Channel Setting Sensing Sensitivity: 4 mV
Pulse Gen Model: 2272
Pulse Gen Serial Number: 9001027

## 2023-12-23 ENCOUNTER — Telehealth: Payer: Self-pay

## 2023-12-23 NOTE — Telephone Encounter (Signed)
 Copied from CRM [id number]. Topic: Clinical - Request for Lab/Test Order >> Dec 23, 2023 12:58 PM Santiya F wrote: Reason for CRM: Patient is calling in because she has a physical scheduled but wanted to know if she could have her lab orders sent over to [facility] so they can have the labs done on Monday 12/26/23 morning since she will already be there. Please advise.

## 2023-12-26 ENCOUNTER — Other Ambulatory Visit: Payer: Self-pay

## 2023-12-26 DIAGNOSIS — E0789 Other specified disorders of thyroid: Secondary | ICD-10-CM | POA: Diagnosis not present

## 2023-12-26 DIAGNOSIS — E559 Vitamin D deficiency, unspecified: Secondary | ICD-10-CM

## 2023-12-26 DIAGNOSIS — E1143 Type 2 diabetes mellitus with diabetic autonomic (poly)neuropathy: Secondary | ICD-10-CM

## 2023-12-26 DIAGNOSIS — E782 Mixed hyperlipidemia: Secondary | ICD-10-CM

## 2023-12-26 DIAGNOSIS — I10 Essential (primary) hypertension: Secondary | ICD-10-CM

## 2023-12-26 NOTE — Telephone Encounter (Signed)
 Lab orders placed. Please advice if any other labs need to be ordered.

## 2023-12-27 LAB — CBC WITH DIFFERENTIAL/PLATELET
Basophils Absolute: 0 x10E3/uL (ref 0.0–0.2)
Basos: 1 %
EOS (ABSOLUTE): 0.2 x10E3/uL (ref 0.0–0.4)
Eos: 3 %
Hematocrit: 46 % (ref 34.0–46.6)
Hemoglobin: 14.7 g/dL (ref 11.1–15.9)
Immature Grans (Abs): 0 x10E3/uL (ref 0.0–0.1)
Immature Granulocytes: 0 %
Lymphocytes Absolute: 2.2 x10E3/uL (ref 0.7–3.1)
Lymphs: 26 %
MCH: 28.4 pg (ref 26.6–33.0)
MCHC: 32 g/dL (ref 31.5–35.7)
MCV: 89 fL (ref 79–97)
Monocytes Absolute: 0.8 x10E3/uL (ref 0.1–0.9)
Monocytes: 9 %
Neutrophils Absolute: 5.2 x10E3/uL (ref 1.4–7.0)
Neutrophils: 60 %
Platelets: 233 x10E3/uL (ref 150–450)
RBC: 5.18 x10E6/uL (ref 3.77–5.28)
RDW: 13.2 % (ref 11.7–15.4)
WBC: 8.5 x10E3/uL (ref 3.4–10.8)

## 2023-12-27 LAB — CMP14+EGFR
ALT: 15 IU/L (ref 0–32)
AST: 22 IU/L (ref 0–40)
Albumin: 4.1 g/dL (ref 3.7–4.7)
Alkaline Phosphatase: 70 IU/L (ref 44–121)
BUN/Creatinine Ratio: 20 (ref 12–28)
BUN: 19 mg/dL (ref 8–27)
Bilirubin Total: 0.5 mg/dL (ref 0.0–1.2)
CO2: 22 mmol/L (ref 20–29)
Calcium: 9.8 mg/dL (ref 8.7–10.3)
Chloride: 105 mmol/L (ref 96–106)
Creatinine, Ser: 0.94 mg/dL (ref 0.57–1.00)
Globulin, Total: 2.6 g/dL (ref 1.5–4.5)
Glucose: 99 mg/dL (ref 70–99)
Potassium: 4.4 mmol/L (ref 3.5–5.2)
Sodium: 142 mmol/L (ref 134–144)
Total Protein: 6.7 g/dL (ref 6.0–8.5)
eGFR: 61 mL/min/1.73 (ref >59)

## 2023-12-27 LAB — TSH: TSH: 5.06 u[IU]/mL — ABNORMAL HIGH (ref 0.450–4.500)

## 2023-12-27 LAB — HEMOGLOBIN A1C
Est. average glucose Bld gHb Est-mCnc: 123 mg/dL
Hgb A1c MFr Bld: 5.9 % — ABNORMAL HIGH (ref 4.8–5.6)

## 2023-12-27 LAB — LIPID PANEL
Chol/HDL Ratio: 3.1 ratio (ref 0.0–4.4)
Cholesterol, Total: 148 mg/dL (ref 100–199)
HDL: 48 mg/dL (ref >39)
LDL Chol Calc (NIH): 71 mg/dL (ref 0–99)
Triglycerides: 170 mg/dL — ABNORMAL HIGH (ref 0–149)
VLDL Cholesterol Cal: 29 mg/dL (ref 5–40)

## 2023-12-27 LAB — VITAMIN D 25 HYDROXY (VIT D DEFICIENCY, FRACTURES): Vit D, 25-Hydroxy: 85.2 ng/mL (ref 30.0–100.0)

## 2023-12-31 NOTE — Progress Notes (Signed)
 Remote PPM Transmission

## 2024-01-03 ENCOUNTER — Encounter: Payer: Self-pay | Admitting: Internal Medicine

## 2024-01-03 ENCOUNTER — Ambulatory Visit: Admitting: Internal Medicine

## 2024-01-03 VITALS — BP 128/64 | HR 82 | Ht 65.0 in | Wt 217.8 lb

## 2024-01-03 DIAGNOSIS — I10 Essential (primary) hypertension: Secondary | ICD-10-CM

## 2024-01-03 DIAGNOSIS — F411 Generalized anxiety disorder: Secondary | ICD-10-CM

## 2024-01-03 DIAGNOSIS — E782 Mixed hyperlipidemia: Secondary | ICD-10-CM | POA: Diagnosis not present

## 2024-01-03 DIAGNOSIS — E1169 Type 2 diabetes mellitus with other specified complication: Secondary | ICD-10-CM | POA: Diagnosis not present

## 2024-01-03 DIAGNOSIS — L304 Erythema intertrigo: Secondary | ICD-10-CM

## 2024-01-03 DIAGNOSIS — Z23 Encounter for immunization: Secondary | ICD-10-CM

## 2024-01-03 DIAGNOSIS — Z7984 Long term (current) use of oral hypoglycemic drugs: Secondary | ICD-10-CM

## 2024-01-03 DIAGNOSIS — R7989 Other specified abnormal findings of blood chemistry: Secondary | ICD-10-CM

## 2024-01-03 DIAGNOSIS — E1143 Type 2 diabetes mellitus with diabetic autonomic (poly)neuropathy: Secondary | ICD-10-CM

## 2024-01-03 DIAGNOSIS — H538 Other visual disturbances: Secondary | ICD-10-CM

## 2024-01-03 MED ORDER — ROSUVASTATIN CALCIUM 5 MG PO TABS: 5.0000 mg | ORAL_TABLET | Freq: Every day | ORAL | 3 refills | Status: AC

## 2024-01-03 MED ORDER — OZEMPIC (1 MG/DOSE) 4 MG/3ML ~~LOC~~ SOPN: 1.0000 mg | PEN_INJECTOR | SUBCUTANEOUS | 3 refills | Status: AC

## 2024-01-03 MED ORDER — GLIPIZIDE 5 MG PO TABS: 5.0000 mg | ORAL_TABLET | Freq: Every day | ORAL | 0 refills | Status: DC

## 2024-01-03 MED ORDER — CLOTRIMAZOLE-BETAMETHASONE 1-0.05 % EX CREA: 1.0000 | TOPICAL_CREAM | Freq: Every day | CUTANEOUS | 0 refills | Status: AC

## 2024-01-03 NOTE — Assessment & Plan Note (Signed)
 Lab Results  Component Value Date   HGBA1C 5.9 (H) 12/26/2023   Well-controlled Associated with HTN, HLD and neuropathy On glipizide  5 mg QD, Farxiga  10 mg QD and Ozempic  1 mg qw Due to tightly controlled DM, DC Glipizide  Advised to follow diabetic diet On statin F/u CMP and lipid panel Diabetic eye exam: Advised to follow up with Ophthalmology for diabetic eye exam  Has diabetic neuropathy, intermittent numbness of feet - has diabetic shoes

## 2024-01-03 NOTE — Progress Notes (Unsigned)
 Established Patient Office Visit  Subjective:  Patient ID: Julie Clay, female    DOB: March 08, 1941  Age: 83 y.o. MRN: [id number]  CC:  Chief Complaint  Patient presents with   Medical Management of Chronic Issues    6 month f/u , needs refills and prior auth on ozempic .    Eye Problem    Has vision changes, has concerns. Would like a stat referral to Dr. Darroll [place] eye [place], [place].    HPI Julie Clay is a 83 y.o. female with past medical history of HTN, asthma, type II DM with neuropathy, GERD, chronic diarrhea, breast ca. and GAD who presents for f/u of her chronic medical conditions.  Type II DM: She has been taking glipizide , Farxiga  and Ozempic . Her HbA1c is 6.0 now, better than prior. Her diarrhea has slightly improved since stopping metformin .  Denies any polyuria or polydipsia currently.  She takes Crestor  for HLD.  She has history of Mobitz 2 second-degree heart block, s/p pacemaker placement and HFpEF. BP is well-controlled. Takes medications regularly. Patient denies headache, dizziness, chest pain, dyspnea or palpitations.  IBS-D: She has chronic diarrhea, takes Imodium  as needed for diarrhea.  Denies any melena or hematochezia.  She reports right-sided facial pain for the last 1 week.  She has chronic nasal congestion.  Denies any fever, chills, dyspnea or wheezing currently.  She has tried using Flonase  and uses humidifier at nighttime.  Past Medical History:  Diagnosis Date   2nd degree AV block 06/22/2017   Arthritis    Asthma    Bone spur    Breast cancer, left breast (HCC)    2012   Breast cancer, right breast (HCC)    2021   Chronic bronchitis (HCC)    COPD (chronic obstructive pulmonary disease) (HCC)    Diabetes mellitus without complication (HCC)    Phreesia 04/08/2020   Diastolic heart failure (HCC)    Essential hypertension    Fatty liver 2013   GERD (gastroesophageal reflux disease)    HSV (herpes simplex virus) infection     Hyperlipidemia    IBS (irritable bowel syndrome)    Lung nodule seen on imaging study 2013   Lymphedema    Left arm   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    Osteoarthritis    both knees, lower back, both shoulders; bone spurs to feet   Rectocele 06/25/2013   Posterior repair 11/06/13    Type II diabetes mellitus (HCC)    Ventral hernia     Past Surgical History:  Procedure Laterality Date   BREAST BIOPSY Left 04/2011   BREAST BIOPSY Left 06/22/13   BREAST BIOPSY Left 09/12/2013   Procedure: BREAST BIOPSY WITH NEEDLE LOCALIZATION;  Surgeon: Oneil DELENA Budge, MD;  Location: [facility];  Service: General;  Laterality: Left;   BREAST LUMPECTOMY Left 04/2011 X 2   BREAST SURGERY N/A    Phreesia 01/17/2020   CATARACT EXTRACTION W/ INTRAOCULAR LENS  IMPLANT, BILATERAL Bilateral    CATARACT EXTRACTION, BILATERAL     on different occassions    CESAREAN SECTION  1973   COLON SURGERY N/A    2014   DILATION AND CURETTAGE OF UTERUS  11/06/2013   HAND LIGAMENT RECONSTRUCTION Right ~ 2010   HYSTEROSCOPY WITH D & C N/A 11/06/2013   Procedure: DILATATION AND CURETTAGE /HYSTEROSCOPY;  Surgeon: Norleen Edsel GAILS, MD;  Location: [facility];  Service: Gynecology;  Laterality: N/A;   INSERT /  REPLACE / REMOVE PACEMAKER  06/22/2017   LUNG LOBECTOMY Right 1988   Fungal Infection   PACEMAKER IMPLANT N/A 06/22/2017   Procedure: PACEMAKER IMPLANT;  Surgeon: Fernande Elspeth BROCKS, MD;  Location: [facility];  Service: Cardiovascular;  Laterality: N/A;   PARTIAL MASTECTOMY WITH NEEDLE LOCALIZATION Right 01/02/2020   Procedure: RIGHT PARTIAL MASTECTOMY AFTER NEEDLE LOCALIZATION;  Surgeon: Mavis Anes, MD;  Location: [facility];  Service: General;  Laterality: Right;   POLYPECTOMY N/A 11/06/2013   Procedure: POLYPECTOMY (REMOVAL ENDOMETRIAL POLYP);  Surgeon: Norleen Edsel GAILS, MD;  Location: [facility];  Service: Gynecology;  Laterality: N/A;   RECTOCELE REPAIR N/A 11/06/2013   Procedure: POSTERIOR REPAIR (RECTOCELE);   Surgeon: Norleen Edsel GAILS, MD;  Location: [facility];  Service: Gynecology;  Laterality: N/A;   SHOULDER ARTHROSCOPY WITH OPEN ROTATOR CUFF REPAIR Left 07/26/2012   TONSILLECTOMY  1949   TUBAL LIGATION      Family History  Problem Relation Age of Onset   Hypertension Mother    Heart disease Mother    Diabetes Mother    Kidney disease Mother        ESRD   Cancer Mother        Bladder   Hypertension Father    Hyperlipidemia Father    Heart disease Father    Diabetes Father    Cancer Father        Stomach Cancer   Diabetes Sister    Heart disease Sister    Alzheimer's disease Paternal Aunt    Stroke Maternal Grandfather    Cancer Paternal Grandfather        stomach   Cancer Maternal Uncle        prostate   Cancer Paternal Uncle        stomach    Social History   Socioeconomic History   Marital status: Married    Spouse name: Darleene   Number of children: 2   Years of education: 12   Highest education level: Some college, no degree  Occupational History   Occupation: retired     Comment: Advice worker for [employer]  Tobacco Use   Smoking status: Never   Smokeless tobacco: Never  Vaping Use   Vaping status: Never Used  Substance and Sexual Activity   Alcohol use: Not Currently    Comment: Twice a year   Drug use: No   Sexual activity: Not Currently    Birth control/protection: Surgical  Other Topics Concern   Not on file  Social History Narrative   Not on file   Social Drivers of Health   Financial Resource Strain: Low Risk  (05/10/2023)   Overall Financial Resource Strain (CARDIA)    Difficulty of Paying Living Expenses: Not hard at all  Food Insecurity: No Food Insecurity (05/10/2023)   Hunger Vital Sign    Worried About Running Out of Food in the Last Year: Never true    Ran Out of Food in the Last Year: Never true  Transportation Needs: No Transportation Needs (05/10/2023)   PRAPARE - Administrator, Civil Service (Medical): No    Lack of  Transportation (Non-Medical): No  Physical Activity: Inactive (05/10/2023)   Exercise Vital Sign    Days of Exercise per Week: 0 days    Minutes of Exercise per Session: 0 min  Stress: No Stress Concern Present (05/10/2023)   [employer] of Occupational Health - Occupational Stress Questionnaire    Feeling of Stress : Not at  all  Social Connections: Socially Integrated (05/10/2023)   Social Connection and Isolation Panel    Frequency of Communication with Friends and Family: More than three times a week    Frequency of Social Gatherings with Friends and Family: More than three times a week    Attends Religious Services: More than 4 times per year    Active Member of [employer] or Organizations: Yes    Attends Engineer, structural: More than 4 times per year    Marital Status: Married  Catering manager Violence: Not At Risk (05/10/2023)   Humiliation, Afraid, Rape, and Kick questionnaire    Fear of Current or Ex-Partner: No    Emotionally Abused: No    Physically Abused: No    Sexually Abused: No    Outpatient Medications Prior to Visit  Medication Sig Dispense Refill   acetaminophen  (TYLENOL ) 500 MG tablet Take 1,000 mg by mouth every 6 (six) hours as needed for moderate pain.     albuterol  (VENTOLIN  HFA) 108 (90 Base) MCG/ACT inhaler Inhale 2 puffs into the lungs every 6 (six) hours as needed for wheezing or shortness of breath. 8 g 0   Aloe Vera Leaf  POWD Take 800 mg by mouth 2 (two) times daily.     amoxicillin -clavulanate (AUGMENTIN ) 875-125 MG tablet Take 1 tablet by mouth 2 (two) times daily. 14 tablet 0   Ascorbic Acid  (VITAMIN C ) 1000 MG tablet Take 1,000 mg by mouth 2 (two) times daily.     b complex vitamins tablet Take 1 tablet by mouth 2 (two) times daily.      Blood Glucose Monitoring Suppl (BLOOD GLUCOSE SYSTEM PAK) KIT Use as directed to monitor FSBS 1x daily. Dx: E11.9. Please dispense as Accu-Chek Aviva 1 each 1   Calcium  Polycarbophil (FIBER-CAPS PO) Take by  mouth daily at 6 (six) AM. 3 qam and Two po QHS     Cholecalciferol  (VITAMIN D3) 2000 UNITS TABS Take 2,000 Units by mouth 2 (two) times daily.     clotrimazole -betamethasone  (LOTRISONE ) cream Apply 1 Application topically daily. 30 g 0   dapagliflozin  propanediol (FARXIGA ) 10 MG TABS tablet Take 1 tablet (10 mg total) by mouth daily before breakfast. 90 tablet 3   diclofenac  Sodium (VOLTAREN ) 1 % GEL Apply 2 g topically 4 (four) times daily. (Patient taking differently: Apply 2 g topically 4 (four) times daily. prn) 100 g 1   fluocinonide  cream (LIDEX ) 0.05 % Apply 1 Application topically 2 (two) times daily as needed (poison ivy/bites/hives). 30 g 11   Ginkgo Biloba 120 MG CAPS Take 120 mg by mouth in the morning and at bedtime.      glipiZIDE  (GLUCOTROL  XL) 5 MG 24 hr tablet Take 1 tablet (5 mg total) by mouth daily with breakfast. 90 tablet 1   Glucose Blood (BLOOD GLUCOSE TEST STRIPS) STRP Use as directed to monitor FSBS 1x daily. Dx: E11.9. Please dispense as Accu-Chek Aviva 100 each 1   KRILL OIL PO Take 1,500 mg by mouth 2 (two) times daily.     Lancets MISC Use as directed to monitor FSBS 1x daily. Dx: E11.9. Please dispense as Accu-Chek Aviva 100 each 1   loperamide  (IMODIUM  A-D) 2 MG tablet Take 1 tablet (2 mg total) by mouth 4 (four) times daily as needed for diarrhea or loose stools. 30 tablet 1   metoprolol  succinate (TOPROL -XL) 25 MG 24 hr tablet Take 1/2 (one-half) tablet by mouth once daily 45 tablet 3   Multiple Vitamin (MULTIVITAMIN WITH  MINERALS) TABS tablet Take 1 tablet by mouth daily.     naftifine  (NAFTIN ) 1 % cream Apply topically 2 (two) times daily as needed. 60 g 2   OZEMPIC , 1 MG/DOSE, 4 MG/3ML SOPN INJECT 1MG  SUBCUTANEOUSLY  ONCE A WEEK AS DIRECTED 9 mL 1   rosuvastatin  (CRESTOR ) 5 MG tablet TAKE 1 TABLET BY MOUTH ONCE DAILY FOR CHOLESTEROL 90 tablet 0   simethicone  (MYLICON) 80 MG chewable tablet Chew 80 mg by mouth as needed for flatulence.      tiZANidine  (ZANAFLEX )  4 MG tablet TAKE 1 TABLET BY MOUTH EVERY 8 HOURS AS NEEDED FOR MUSCLE SPASM 90 tablet 0   torsemide  (DEMADEX ) 20 MG tablet Take 1 tablet (20 mg total) by mouth 3 (three) times a week. 40 tablet 3   UNABLE TO FIND Please dispense custom molded foot orthotics and heel spurs Dx: E11.30 1 each 0   No facility-administered medications prior to visit.    Allergies  Allergen Reactions   Arimidex  [Anastrozole ] Other (See Comments)    Arthralgias and myalgias.  Improved with 3 week hiatus from drug.  Categorical side effect of drug class.   Other Hives and Itching   Adhesive [Tape] Other (See Comments)    red and blistered   Lisinopril      Dry hacking cough and tickle   Tetanus Toxoid-Containing Vaccines    Tetracyclines & Related Other (See Comments)    unknown    ROS Review of Systems  Constitutional:  Negative for chills and fever.  HENT:  Positive for sinus pressure. Negative for congestion, sinus pain and sore throat.   Eyes:  Negative for pain and discharge.  Respiratory:  Negative for cough and shortness of breath.   Cardiovascular:  Negative for chest pain and palpitations.  Gastrointestinal:  Positive for diarrhea. Negative for abdominal pain, blood in stool, nausea and vomiting.  Endocrine: Negative for polydipsia and polyuria.  Genitourinary:  Negative for dysuria and hematuria.  Musculoskeletal:  Negative for neck pain and neck stiffness.  Skin:  Negative for rash.  Neurological:  Negative for dizziness and weakness.  Psychiatric/Behavioral:  Negative for agitation and behavioral problems.       Objective:    Physical Exam Vitals reviewed.  Constitutional:      General: She is not in acute distress.    Appearance: She is obese. She is not diaphoretic.  HENT:     Head: Normocephalic and atraumatic.     Nose: No congestion.     Mouth/Throat:     Mouth: Mucous membranes are moist.     Pharynx: No posterior oropharyngeal erythema.  Eyes:     General: No scleral  icterus.    Extraocular Movements: Extraocular movements intact.  Neck:     Vascular: No carotid bruit.  Cardiovascular:     Rate and Rhythm: Normal rate and regular rhythm.     Heart sounds: Normal heart sounds. No murmur heard. Pulmonary:     Breath sounds: Normal breath sounds. No wheezing or rales.  Abdominal:     Palpations: Abdomen is soft.     Tenderness: There is no abdominal tenderness.  Musculoskeletal:     Cervical back: Neck supple. No tenderness.     Right lower leg: No edema.     Left lower leg: No edema.  Skin:    General: Skin is warm.     Findings: Lesion (Brownish papule over left side of face and left upper arm - likely actinic keratoses) present. No rash.  Neurological:     General: No focal deficit present.     Mental Status: She is alert and oriented to person, place, and time.     Sensory: No sensory deficit.     Motor: No weakness.  Psychiatric:        Mood and Affect: Mood normal.        Behavior: Behavior normal.     BP 128/64 (BP Location: Right Arm)   Pulse 82   Ht 5' 5 (1.651 m)   Wt 217 lb 12.8 oz (98.8 kg)   SpO2 97%   BMI 36.24 kg/m  Wt Readings from Last 3 Encounters:  01/03/24 217 lb 12.8 oz (98.8 kg)  06/28/23 219 lb (99.3 kg)  06/01/23 219 lb (99.3 kg)    Lab Results  Component Value Date   TSH 5.060 (H) 12/26/2023   Lab Results  Component Value Date   WBC 8.5 12/26/2023   HGB 14.7 12/26/2023   HCT 46.0 12/26/2023   MCV 89 12/26/2023   PLT 233 12/26/2023   Lab Results  Component Value Date   NA 142 12/26/2023   K 4.4 12/26/2023   CO2 22 12/26/2023   GLUCOSE 99 12/26/2023   BUN 19 12/26/2023   CREATININE 0.94 12/26/2023   BILITOT 0.5 12/26/2023   ALKPHOS 70 12/26/2023   AST 22 12/26/2023   ALT 15 12/26/2023   PROT 6.7 12/26/2023   ALBUMIN 4.1 12/26/2023   CALCIUM  9.8 12/26/2023   ANIONGAP 9 09/01/2022   EGFR 61 12/26/2023   Lab Results  Component Value Date   CHOL 148 12/26/2023   Lab Results  Component  Value Date   HDL 48 12/26/2023   Lab Results  Component Value Date   LDLCALC 71 12/26/2023   Lab Results  Component Value Date   TRIG 170 (H) 12/26/2023   Lab Results  Component Value Date   CHOLHDL 3.1 12/26/2023   Lab Results  Component Value Date   HGBA1C 5.9 (H) 12/26/2023      Assessment & Plan:   Problem List Items Addressed This Visit   None     No orders of the defined types were placed in this encounter.   Follow-up: No follow-ups on file.    Julie MARLA Blanch, MD

## 2024-01-03 NOTE — Assessment & Plan Note (Signed)
 Has Ativan, takes occasionally for severe anxiety

## 2024-01-03 NOTE — Assessment & Plan Note (Signed)
 BP Readings from Last 1 Encounters:  01/03/24 128/64   Well-controlled with Metoprolol  (for CHF) Counseled for compliance with the medications Advised DASH diet and moderate exercise/walking as tolerated

## 2024-01-03 NOTE — Assessment & Plan Note (Signed)
 On Crestor 5 mg QD

## 2024-01-03 NOTE — Patient Instructions (Addendum)
 Please stop taking Glipizide  XL. Please take Glipizide  around the time of steroid injection.  Please continue to take other medications as prescribed.  Please continue to follow low carb diet and ambulate as tolerated.

## 2024-01-06 DIAGNOSIS — H538 Other visual disturbances: Secondary | ICD-10-CM | POA: Insufficient documentation

## 2024-01-06 DIAGNOSIS — R7989 Other specified abnormal findings of blood chemistry: Secondary | ICD-10-CM | POA: Insufficient documentation

## 2024-01-06 NOTE — Assessment & Plan Note (Signed)
 Lab Results  Component Value Date   TSH 5.060 (H) 12/26/2023   Currently asymptomatic Borderline elevated TSH Will recheck TSH and free T4

## 2024-01-06 NOTE — Assessment & Plan Note (Signed)
 Reports recent worsening of vision with floaters Referred to local optometry

## 2024-01-06 NOTE — Assessment & Plan Note (Addendum)
Has had groin area intertrigo Has used Lotrisone in the past, refilled

## 2024-02-06 ENCOUNTER — Other Ambulatory Visit: Payer: Self-pay | Admitting: Internal Medicine

## 2024-02-06 DIAGNOSIS — T148XXA Other injury of unspecified body region, initial encounter: Secondary | ICD-10-CM

## 2024-02-13 ENCOUNTER — Telehealth: Payer: Self-pay | Admitting: Pharmacy Technician

## 2024-02-13 ENCOUNTER — Other Ambulatory Visit (HOSPITAL_COMMUNITY): Payer: Self-pay

## 2024-02-13 NOTE — Telephone Encounter (Signed)
 Pharmacy Patient Advocate Encounter  Received notification from [facility] that Prior Authorization for Ozempic  (1 MG/DOSE) 4MG /3ML pen-injectors has been APPROVED from 01/14/24 to 02/12/25. Unable to obtain price due to refill too soon rejection, last fill date 0916/25 next available fill date 03/06/24   PA #/Case ID/Reference #: Approval letter has been uploaded to media tab

## 2024-02-13 NOTE — Telephone Encounter (Signed)
 Pharmacy Patient Advocate Encounter   Received notification from CoverMyMeds that prior authorization for Ozempic  (1 MG/DOSE) 4MG /3ML pen-injectors is due for renewal.   Insurance verification completed.   The patient is insured through [employer].  Action: PA required; PA submitted to above mentioned insurance via Latent Key/confirmation #/[id number] Status is pending

## 2024-02-15 ENCOUNTER — Other Ambulatory Visit (HOSPITAL_COMMUNITY): Payer: Self-pay

## 2024-02-15 DIAGNOSIS — R0609 Other forms of dyspnea: Secondary | ICD-10-CM

## 2024-02-15 DIAGNOSIS — I5032 Chronic diastolic (congestive) heart failure: Secondary | ICD-10-CM

## 2024-02-20 ENCOUNTER — Encounter (HOSPITAL_COMMUNITY)
Admission: RE | Admit: 2024-02-20 | Discharge: 2024-02-20 | Disposition: A | Discharge status: Home / Self Care | Source: Ambulatory Visit | Attending: Cardiology | Admitting: Cardiology

## 2024-02-20 VITALS — Ht 65.0 in | Wt 216.7 lb

## 2024-02-20 DIAGNOSIS — R0609 Other forms of dyspnea: Secondary | ICD-10-CM | POA: Insufficient documentation

## 2024-02-20 DIAGNOSIS — I5032 Chronic diastolic (congestive) heart failure: Secondary | ICD-10-CM | POA: Insufficient documentation

## 2024-02-20 NOTE — Patient Instructions (Addendum)
 Patient Instructions  Patient Details  Name: Julie Clay MRN: [id number] Date of Birth: 1940/07/02 Referring Provider:  Debera Jayson MATSU, MD  Below are your personal goals for exercise, nutrition, and risk factors. Our goal is to help you stay on track towards obtaining and maintaining these goals. We will be discussing your progress on these goals with you throughout the program.  Initial Exercise Prescription:  Initial Exercise Prescription - 02/20/24 1400       Date of Initial Exercise RX and Referring Provider   Date 02/20/24    Referring Provider Debera Jayson MD      NuStep   Level 1    SPM 50    Minutes 15    METs 1.9      Arm Ergometer   Level 1    RPM 50    Minutes 15    METs 1.6      Prescription Details   Frequency (times per week) 3    Duration Progress to 30 minutes of continuous aerobic without signs/symptoms of physical distress      Intensity   THRR 40-80% of Max Heartrate 98-125    Ratings of Perceived Exertion 11-13    Perceived Dyspnea 0-4      Resistance Training   Training Prescription Yes    Weight 3    Reps 10-15          Exercise Goals: Frequency: Be able to perform aerobic exercise two to three times per week in program working toward 2-5 days per week of home exercise.  Intensity: Work with a perceived exertion of 11 (fairly light) - 15 (hard) while following your exercise prescription.  We will make changes to your prescription with you as you progress through the program.   Duration: Be able to do 30 to 45 minutes of continuous aerobic exercise in addition to a 5 minute warm-up and a 5 minute cool-down routine.   Nutrition Goals: Your personal nutrition goals will be established when you do your nutrition analysis with the dietician.  The following are general nutrition guidelines to follow: Cholesterol < 200mg /day Sodium < 1500mg /day Fiber: Women over 50 yrs - 21 grams per day  Personal Goals:  Personal Goals and Risk  Factors at Admission - 02/20/24 1320       Core Components/Risk Factors/Patient Goals on Admission    Weight Management Obesity    Improve shortness of breath with ADL's Yes    Intervention Provide education, individualized exercise plan and daily activity instruction to help decrease symptoms of SOB with activities of daily living.    Expected Outcomes Short Term: Improve cardiorespiratory fitness to achieve a reduction of symptoms when performing ADLs;Long Term: Be able to perform more ADLs without symptoms or delay the onset of symptoms    Diabetes Yes    Intervention Provide education about signs/symptoms and action to take for hypo/hyperglycemia.;Provide education about proper nutrition, including hydration, and aerobic/resistive exercise prescription along with prescribed medications to achieve blood glucose in normal ranges: Fasting glucose 65-99 mg/dL    Expected Outcomes Short Term: Participant verbalizes understanding of the signs/symptoms and immediate care of hyper/hypoglycemia, proper foot care and importance of medication, aerobic/resistive exercise and nutrition plan for blood glucose control.;Long Term: Attainment of HbA1C < 7%.    Heart Failure Yes    Intervention Provide a combined exercise and nutrition program that is supplemented with education, support and counseling about heart failure. Directed toward relieving symptoms such as shortness of breath, decreased exercise tolerance,  and extremity edema.    Expected Outcomes Improve functional capacity of life;Short term: Attendance in program 2-3 days a week with increased exercise capacity. Reported lower sodium intake. Reported increased fruit and vegetable intake. Reports medication compliance.;Short term: Daily weights obtained and reported for increase. Utilizing diuretic protocols set by physician.;Long term: Adoption of self-care skills and reduction of barriers for early signs and symptoms recognition and intervention leading  to self-care maintenance.    Hypertension Yes    Intervention Provide education on lifestyle modifcations including regular physical activity/exercise, weight management, moderate sodium restriction and increased consumption of fresh fruit, vegetables, and low fat dairy, alcohol moderation, and smoking cessation.;Monitor prescription use compliance.    Expected Outcomes Short Term: Continued assessment and intervention until BP is < 140/30mm HG in hypertensive participants. < 130/73mm HG in hypertensive participants with diabetes, heart failure or chronic kidney disease.;Long Term: Maintenance of blood pressure at goal levels.    Lipids Yes    Intervention Provide education and support for participant on nutrition & aerobic/resistive exercise along with prescribed medications to achieve LDL 70mg , HDL >40mg .    Expected Outcomes Short Term: Participant states understanding of desired cholesterol values and is compliant with medications prescribed. Participant is following exercise prescription and nutrition guidelines.;Long Term: Cholesterol controlled with medications as prescribed, with individualized exercise RX and with personalized nutrition plan. Value goals: LDL < 70mg , HDL > 40 mg.          Exercise Goals and Review:  Exercise Goals     Row Name 02/20/24 1416             Exercise Goals   Increase Physical Activity Yes       Intervention Provide advice, education, support and counseling about physical activity/exercise needs.;Develop an individualized exercise prescription for aerobic and resistive training based on initial evaluation findings, risk stratification, comorbidities and participant's personal goals.       Expected Outcomes Short Term: Attend rehab on a regular basis to increase amount of physical activity.;Long Term: Exercising regularly at least 3-5 days a week.;Long Term: Add in home exercise to make exercise part of routine and to increase amount of physical activity.        Increase Strength and Stamina Yes       Intervention Provide advice, education, support and counseling about physical activity/exercise needs.;Develop an individualized exercise prescription for aerobic and resistive training based on initial evaluation findings, risk stratification, comorbidities and participant's personal goals.       Expected Outcomes Short Term: Increase workloads from initial exercise prescription for resistance, speed, and METs.;Long Term: Improve cardiorespiratory fitness, muscular endurance and strength as measured by increased METs and functional capacity ( );Short Term: Perform resistance training exercises routinely during rehab and add in resistance training at home       Able to understand and use rate of perceived exertion (RPE) scale Yes       Intervention Provide education and explanation on how to use RPE scale       Expected Outcomes Short Term: Able to use RPE daily in rehab to express subjective intensity level;Long Term:  Able to use RPE to guide intensity level when exercising independently       Able to understand and use Dyspnea scale Yes       Intervention Provide education and explanation on how to use Dyspnea scale       Expected Outcomes Short Term: Able to use Dyspnea scale daily in rehab to express subjective sense of shortness  of breath during exertion;Long Term: Able to use Dyspnea scale to guide intensity level when exercising independently       Knowledge and understanding of Target Heart Rate Range (THRR) Yes       Intervention Provide education and explanation of THRR including how the numbers were predicted and where they are located for reference       Expected Outcomes Short Term: Able to state/look up THRR;Long Term: Able to use THRR to govern intensity when exercising independently;Short Term: Able to use daily as guideline for intensity in rehab       Able to check pulse independently Yes       Intervention Provide education and  demonstration on how to check pulse in carotid and radial arteries.;Review the importance of being able to check your own pulse for safety during independent exercise       Expected Outcomes Long Term: Able to check pulse independently and accurately;Short Term: Able to explain why pulse checking is important during independent exercise       Understanding of Exercise Prescription Yes       Intervention Provide education, explanation, and written materials on patient's individual exercise prescription       Expected Outcomes Short Term: Able to explain program exercise prescription;Long Term: Able to explain home exercise prescription to exercise independently          Copy of goals given to participant.

## 2024-02-20 NOTE — Progress Notes (Signed)
 Pulmonary Individual Treatment Plan  Patient Details  Name: Julie Clay MRN: [id number] Date of Birth: 1941/02/18 Referring Provider:   Flowsheet Row PULMONARY REHAB OTHER RESP ORIENTATION from 02/20/2024 in [facility] CARDIAC REHABILITATION  Referring Provider Debera Savant MD    Initial Encounter Date:  Flowsheet Row PULMONARY REHAB OTHER RESP ORIENTATION from 02/20/2024 in [facility] CARDIAC REH[facility]IL[facility]  Date 02/20/24    Visit Diagnosis: DOE (dyspnea on exertion)  Chronic diastolic heart failure (HCC)  Patient's Home Medications on Admission:   Current Outpatient Medications:    acetaminophen  (TYLENOL ) 500 MG tablet, Take 1,000 mg by mouth every 6 (six) hours as needed for moderate pain., Disp: , Rfl:    albuterol  (VENTOLIN  HFA) 108 (90 Base) MCG/ACT inhaler, Inhale 2 puffs into the lungs every 6 (six) hours as needed for wheezing or shortness of breath., Disp: 8 g, Rfl: 0   Aloe Vera Leaf  POWD, Take 800 mg by mouth 2 (two) times daily., Disp: , Rfl:    Ascorbic Acid  (VITAMIN C ) 1000 MG tablet, Take 1,000 mg by mouth 2 (two) times daily., Disp: , Rfl:    b complex vitamins tablet, Take 1 tablet by mouth 2 (two) times daily. , Disp: , Rfl:    Blood Glucose Monitoring Suppl (BLOOD GLUCOSE SYSTEM PAK) KIT, Use as directed to monitor FSBS 1x daily. Dx: E11.9. Please dispense as Accu-Chek Aviva, Disp: 1 each, Rfl: 1   Calcium  Polycarbophil (FIBER-CAPS PO), Take by mouth daily at 6 (six) AM. 3 qam and Two po QHS, Disp: , Rfl:    Cholecalciferol  (VITAMIN D3) 2000 UNITS TABS, Take 2,000 Units by mouth 2 (two) times daily., Disp: , Rfl:    clotrimazole -betamethasone  (LOTRISONE ) cream, Apply 1 Application topically daily., Disp: 30 g, Rfl: 0   dapagliflozin  propanediol (FARXIGA ) 10 MG TABS tablet, Take 1 tablet (10 mg total) by mouth daily before breakfast., Disp: 90 tablet, Rfl: 3   diclofenac  Sodium (VOLTAREN ) 1 % GEL, Apply 2 g topically 4 (four) times daily. (Patient taking  differently: Apply 2 g topically 4 (four) times daily. prn), Disp: 100 g, Rfl: 1   fluocinonide  cream (LIDEX ) 0.05 %, Apply 1 Application topically 2 (two) times daily as needed (poison ivy/bites/hives)., Disp: 30 g, Rfl: 11   Ginkgo Biloba 120 MG CAPS, Take 120 mg by mouth in the morning and at bedtime. , Disp: , Rfl:    Glucose Blood (BLOOD GLUCOSE TEST STRIPS) STRP, Use as directed to monitor FSBS 1x daily. Dx: E11.9. Please dispense as Accu-Chek Aviva, Disp: 100 each, Rfl: 1   KRILL OIL PO, Take 1,500 mg by mouth 2 (two) times daily., Disp: , Rfl:    Lancets MISC, Use as directed to monitor FSBS 1x daily. Dx: E11.9. Please dispense as Accu-Chek Aviva, Disp: 100 each, Rfl: 1   loperamide  (IMODIUM  A-D) 2 MG tablet, Take 1 tablet (2 mg total) by mouth 4 (four) times daily as needed for diarrhea or loose stools., Disp: 30 tablet, Rfl: 1   metoprolol  succinate (TOPROL -XL) 25 MG 24 hr tablet, Take 1/2 (one-half) tablet by mouth once daily (Patient taking differently: Take 25 mg by mouth daily. Takes 1/2 tab.), Disp: 45 tablet, Rfl: 3   Multiple Vitamin (MULTIVITAMIN WITH MINERALS) TABS tablet, Take 1 tablet by mouth daily., Disp: , Rfl:    naftifine  (NAFTIN ) 1 % cream, Apply topically 2 (two) times daily as needed., Disp: 60 g, Rfl: 2   rosuvastatin  (CRESTOR ) 5 MG tablet, Take 1 tablet (5 mg total) by  mouth daily. for cholesterol., Disp: 90 tablet, Rfl: 3   Semaglutide , 1 MG/DOSE, (OZEMPIC , 1 MG/DOSE,) 4 MG/3ML SOPN, Inject 1 mg into the skin once a week., Disp: 9 mL, Rfl: 3   tiZANidine  (ZANAFLEX ) 4 MG tablet, TAKE 1 TABLET BY MOUTH EVERY 8 HOURS AS NEEDED FOR MUSCLE SPASM (Patient taking differently: Take 4 mg by mouth at bedtime. Takes 1/2 at bedtime.), Disp: 90 tablet, Rfl: 0   torsemide  (DEMADEX ) 20 MG tablet, Take 1 tablet (20 mg total) by mouth 3 (three) times a week. (Patient taking differently: Take 20 mg by mouth as directed. Takes 1/2 as needed for weight changes.), Disp: 40 tablet, Rfl: 3    glipiZIDE  (GLUCOTROL ) 5 MG tablet, Take 1 tablet (5 mg total) by mouth daily before breakfast. (Patient not taking: Reported on 02/20/2024), Disp: 30 tablet, Rfl: 0   simethicone  (MYLICON) 80 MG chewable tablet, Chew 80 mg by mouth as needed for flatulence. 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    Osteoarthritis    both knees, lower back, both shoulders; bone spurs to feet   Rectocele 06/25/2013   Posterior repair 11/06/13    Type II diabetes mellitus (HCC)    Ventral hernia     Tobacco Use: Social History   Tobacco Use  Smoking Status Never  Smokeless Tobacco Never    Labs: Review Flowsheet  More data exists      Latest Ref Rng & Units 12/23/2021 06/18/2022 12/24/2022 06/28/2023 12/26/2023  Labs for ITP Cardiac and Pulmonary Rehab  Cholestrol 100 - 199 mg/dL - 829  - 855  851   LDL (calc) 0 - 99 mg/dL - 78  - 62  71   HDL-C >39 mg/dL - 47  - 44  48   Trlycerides 0 - 149 mg/dL - 719  - 762  829   Hemoglobin A1c 4.8 - 5.6 % 6.5  6.5  6.5  6.4  6.0  5.9     Details       Multiple values from one day are sorted in reverse-chronological order         Capillary Blood Glucose: Lab Results  Component Value Date   GLUCAP 111 (H) 01/02/2020   GLUCAP 83 06/23/2017   GLUCAP 117 (H) 06/22/2017   GLUCAP 81  06/22/2017   GLUCAP 97 06/22/2017     Pulmonary Assessment Scores:  Pulmonary Assessment Scores     Row Name 02/20/24 1409         ADL UCSD   ADL Phase Entry     SOB Score total 77     Rest 0  Walk 2     Stairs 5     Bath 2     Dress 3     Shop 5       CAT Score   CAT Score 17       mMRC Score   mMRC Score 3       UCSD: Self-administered rating of dyspnea associated with activities of daily living (ADLs) 6-point scale (0 = not at all to 5 = maximal or unable to do because of breathlessness)  Scoring Scores range from 0 to 120.  Minimally important difference is 5 units  CAT: CAT can identify the health impairment of COPD patients and is better correlated with disease progression.  CAT has a scoring range of zero to 40. The CAT score is classified into four groups of low (less than 10), medium (10 - 20), high (21-30) and very high (31-40) based on the impact level of disease on health status. A CAT score over 10 suggests significant symptoms.  A worsening CAT score could be explained by an exacerbation, poor medication adherence, poor inhaler technique, or progression of COPD or comorbid conditions.  CAT MCID is 2 points  mMRC: mMRC (Modified Medical Research Council) Dyspnea Scale is used to assess the degree of baseline functional disability in patients of respiratory disease due to dyspnea. No minimal important difference is established. A decrease in score of 1 point or greater is considered a positive change.   Pulmonary Function Assessment:   Exercise Target Goals: Exercise Program Goal: Individual exercise prescription set using results from initial 6 min walk test and THRR while considering  patient's activity barriers and safety.   Exercise Prescription Goal: Initial exercise prescription builds to 30-45 minutes a day of aerobic activity, 2-3 days per week.  Home exercise guidelines will be given to patient during program as part of exercise  prescription that the participant will acknowledge.  Activity Barriers & Risk Stratification:  Activity Barriers & Cardiac Risk Stratification - 02/20/24 1315       Activity Barriers & Cardiac Risk Stratification   Activity Barriers Arthritis;Back Problems;Shortness of Breath;Deconditioning;Other (comment)    Comments spurs in both heels and knees          6 Minute Walk:  6 Minute Walk     Row Name 02/20/24 1412         6 Minute Walk   Phase Initial     Distance 750 feet     Walk Time 6 minutes     # of Rest Breaks 1     MPH 1.42     METS 1.04     RPE 13     Perceived Dyspnea  2     VO2 Peak 3.67     Symptoms Yes (comment)     Comments SOB and BLE pain L knee     Resting HR 70 bpm     Resting BP 120/64     Resting Oxygen Saturation  96 %     Exercise Oxygen Saturation  during 6 min walk 91 %     Max Ex. HR 117 bpm     Max Ex. BP 136/64     2 Minute Post BP 122/62       Interval HR   1 Minute HR 102     2 Minute HR 117     3 Minute HR 110     4 Minute HR 106     5 Minute HR 77  6 Minute HR 110     2 Minute Post HR 78     Interval Heart Rate? Yes       Interval Oxygen   Interval Oxygen? Yes     Baseline Oxygen Saturation % 96 %     1 Minute Oxygen Saturation % 91 %     1 Minute Liters of Oxygen 0 L     2 Minute Oxygen Saturation % 94 %     2 Minute Liters of Oxygen 0 L     3 Minute Oxygen Saturation % 95 %     3 Minute Liters of Oxygen 0 L     4 Minute Oxygen Saturation % 96 %     4 Minute Liters of Oxygen 0 L     5 Minute Oxygen Saturation % 95 %     5 Minute Liters of Oxygen 0 L     6 Minute Oxygen Saturation % 95 %     6 Minute Liters of Oxygen 0 L     2 Minute Post Oxygen Saturation % 96 %     2 Minute Post Liters of Oxygen 0 L        Oxygen Initial Assessment:  Oxygen Initial Assessment - 02/20/24 1419       Home Oxygen   Home Oxygen Device None    Sleep Oxygen Prescription None    Home Exercise Oxygen Prescription None    Home  Resting Oxygen Prescription None      Initial 6 min Walk   Oxygen Used None      Program Oxygen Prescription   Program Oxygen Prescription None      Intervention   Short Term Goals To learn and understand importance of monitoring SPO2 with pulse oximeter and demonstrate accurate use of the pulse oximeter.;To learn and understand importance of maintaining oxygen saturations>88%;To learn and demonstrate proper pursed lip breathing techniques or other breathing techniques. ;To learn and demonstrate proper use of respiratory medications    Long  Term Goals Verbalizes importance of monitoring SPO2 with pulse oximeter and return demonstration;Maintenance of O2 saturations>88%;Exhibits proper breathing techniques, such as pursed lip breathing or other method taught during program session;Compliance with respiratory medication;Demonstrates proper use of MDI's          Oxygen Re-Evaluation:   Oxygen Discharge (Final Oxygen Re-Evaluation):   Initial Exercise Prescription:  Initial Exercise Prescription - 02/20/24 1400       Date of Initial Exercise RX and Referring Provider   Date 02/20/24    Referring Provider Debera Savant MD      NuStep   Level 1    SPM 50    Minutes 15    METs 1.9      Arm Ergometer   Level 1    RPM 50    Minutes 15    METs 1.6      Prescription Details   Frequency (times per week) 3    Duration Progress to 30 minutes of continuous aerobic without signs/symptoms of physical distress      Intensity   THRR 40-80% of Max Heartrate 98-125    Ratings of Perceived Exertion 11-13    Perceived Dyspnea 0-4      Resistance Training   Training Prescription Yes    Weight 3    Reps 10-15          Perform Capillary Blood Glucose checks as needed.  Exercise Prescription Changes:   Exercise Prescription Changes  Row Name 02/20/24 1400             Response to Exercise   Blood Pressure (Admit) 120/64       Blood Pressure (Exercise) 136/64        Blood Pressure (Exit) 122/62       Heart Rate (Admit) 70 bpm       Heart Rate (Exercise) 117 bpm       Heart Rate (Exit) 78 bpm       Oxygen Saturation (Admit) 96 %       Oxygen Saturation (Exercise) 91 %       Oxygen Saturation (Exit) 95 %       Rating of Perceived Exertion (Exercise) 13       Perceived Dyspnea (Exercise) 2          Exercise Comments:   Exercise Goals and Review:   Exercise Goals     Row Name 02/20/24 1416             Exercise Goals   Increase Physical Activity Yes       Intervention Provide advice, education, support and counseling about physical activity/exercise needs.;Develop an individualized exercise prescription for aerobic and resistive training based on initial evaluation findings, risk stratification, comorbidities and participant's personal goals.       Expected Outcomes Short Term: Attend rehab on a regular basis to increase amount of physical activity.;Long Term: Exercising regularly at least 3-5 days a week.;Long Term: Add in home exercise to make exercise part of routine and to increase amount of physical activity.       Increase Strength and Stamina Yes       Intervention Provide advice, education, support and counseling about physical activity/exercise needs.;Develop an individualized exercise prescription for aerobic and resistive training based on initial evaluation findings, risk stratification, comorbidities and participant's personal goals.       Expected Outcomes Short Term: Increase workloads from initial exercise prescription for resistance, speed, and METs.;Long Term: Improve cardiorespiratory fitness, muscular endurance and strength as measured by increased METs and functional capacity ( );Short Term: Perform resistance training exercises routinely during rehab and add in resistance training at home       Able to understand and use rate of perceived exertion (RPE) scale Yes       Intervention Provide education and explanation on how  to use RPE scale       Expected Outcomes Short Term: Able to use RPE daily in rehab to express subjective intensity level;Long Term:  Able to use RPE to guide intensity level when exercising independently       Able to understand and use Dyspnea scale Yes       Intervention Provide education and explanation on how to use Dyspnea scale       Expected Outcomes Short Term: Able to use Dyspnea scale daily in rehab to express subjective sense of shortness of breath during exertion;Long Term: Able to use Dyspnea scale to guide intensity level when exercising independently       Knowledge and understanding of Target Heart Rate Range (THRR) Yes       Intervention Provide education and explanation of THRR including how the numbers were predicted and where they are located for reference       Expected Outcomes Short Term: Able to state/look up THRR;Long Term: Able to use THRR to govern intensity when exercising independently;Short Term: Able to use daily as guideline for intensity in rehab  Able to check pulse independently Yes       Intervention Provide education and demonstration on how to check pulse in carotid and radial arteries.;Review the importance of being able to check your own pulse for safety during independent exercise       Expected Outcomes Long Term: Able to check pulse independently and accurately;Short Term: Able to explain why pulse checking is important during independent exercise       Understanding of Exercise Prescription Yes       Intervention Provide education, explanation, and written materials on patient's individual exercise prescription       Expected Outcomes Short Term: Able to explain program exercise prescription;Long Term: Able to explain home exercise prescription to exercise independently          Exercise Goals Re-Evaluation :   Discharge Exercise Prescription (Final Exercise Prescription Changes):  Exercise Prescription Changes - 02/20/24 1400       Response  to Exercise   Blood Pressure (Admit) 120/64    Blood Pressure (Exercise) 136/64    Blood Pressure (Exit) 122/62    Heart Rate (Admit) 70 bpm    Heart Rate (Exercise) 117 bpm    Heart Rate (Exit) 78 bpm    Oxygen Saturation (Admit) 96 %    Oxygen Saturation (Exercise) 91 %    Oxygen Saturation (Exit) 95 %    Rating of Perceived Exertion (Exercise) 13    Perceived Dyspnea (Exercise) 2          Nutrition:  Target Goals: Understanding of nutrition guidelines, daily intake of sodium 1500mg , cholesterol 200mg , calories 30% from fat and 7% or less from saturated fats, daily to have 5 or more servings of fruits and vegetables.  Biometrics:  Pre Biometrics - 02/20/24 1417       Pre Biometrics   Height 5' 5 (1.651 m)    Weight 98.3 kg    Waist Circumference 46 inches    Hip Circumference 53.5 inches    Waist to Hip Ratio 0.86 %    BMI (Calculated) 36.06    Grip Strength 20.2 kg    Single Leg Stand 9.54 seconds           Nutrition Therapy Plan and Nutrition Goals:   Nutrition Assessments:  MEDIFICTS Score Key: >=70 Need to make dietary changes  40-70 Heart Healthy Diet <= 40 Therapeutic Level Cholesterol Diet  Flowsheet Row PULMONARY REHAB OTHER RESP ORIENTATION from 02/20/2024 in [facility] CARDIAC REHABILITATION  Picture Your Plate Total Score on Admission 54   Picture Your Plate Scores: <59 Unhealthy dietary pattern with much room for improvement. 41-50 Dietary pattern unlikely to meet recommendations for good health and room for improvement. 51-60 More healthful dietary pattern, with some room for improvement.  >60 Healthy dietary pattern, although there may be some specific behaviors that could be improved.    Nutrition Goals Re-Evaluation:   Nutrition Goals Discharge (Final Nutrition Goals Re-Evaluation):   Psychosocial: Target Goals: Acknowledge presence or absence of significant depression and/or stress, maximize coping skills, provide positive support  system. Participant is able to verbalize types and ability to use techniques and skills needed for reducing stress and depression.  Initial Review & Psychosocial Screening:  Initial Psych Review & Screening - 02/20/24 1438       Initial Review   Current issues with Current Sleep Concerns      Family Dynamics   Good Support System? Yes    Comments Patient's husband is her main support.  Barriers   Psychosocial barriers to participate in program The patient should benefit from training in stress management and relaxation.      Screening Interventions   Interventions Encouraged to exercise;To provide support and resources with identified psychosocial needs;Provide feedback about the scores to participant    Expected Outcomes Short Term goal: Identification and review with participant of any Quality of Life or Depression concerns found by scoring the questionnaire.;Long Term goal: The participant improves quality of Life and PHQ9 Scores as seen by post scores and/or verbalization of changes;Long Term Goal: Stressors or current issues are controlled or eliminated.;Short Term goal: Utilizing psychosocial counselor, staff and physician to assist with identification of specific Stressors or current issues interfering with healing process. Setting desired goal for each stressor or current issue identified.          Quality of Life Scores:  Scores of 19 and below usually indicate a poorer quality of life in these areas.  A difference of  2-3 points is a clinically meaningful difference.  A difference of 2-3 points in the total score of the Quality of Life Index has been associated with significant improvement in overall quality of life, self-image, physical symptoms, and general health in studies assessing change in quality of life.  PHQ-9: Review Flowsheet  More data exists      02/20/2024 01/03/2024 06/28/2023 05/10/2023 12/29/2022  Depression screen PHQ 2/9  Decreased Interest 0 0 0 0 0   Down, Depressed, Hopeless 0 0 0 0 0  PHQ - 2 Score 0 0 0 0 0  Altered sleeping 2 0 0 1 -  Tired, decreased energy 3 0 0 2 -  Change in appetite 0 0 0 0 -  Feeling bad or failure about yourself  0 0 0 0 -  Trouble concentrating 0 0 0 0 -  Moving slowly or fidgety/restless 0 0 0 0 -  Suicidal thoughts 0 0 0 0 -  PHQ-9 Score 5 0 0 3 -  Difficult doing work/chores Somewhat difficult Not difficult at all Not difficult at all Not difficult at all -   Interpretation of Total Score  Total Score Depression Severity:  1-4 = Minimal depression, 5-9 = Mild depression, 10-14 = Moderate depression, 15-19 = Moderately severe depression, 20-27 = Severe depression   Psychosocial Evaluation and Intervention:  Psychosocial Evaluation - 02/20/24 1438       Psychosocial Evaluation & Interventions   Interventions Stress management education;Relaxation education;Encouraged to exercise with the program and follow exercise prescription    Comments Patient was referred to pulmonary rehab with DOE and diastolic HF. Her PHQ-9 score was 5 due to trouble staying asleep and energy.  She denies any depression or anxiety. She has been through [facility] in 2019 and joined maintenance but did not return after COVID. 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 She is already familiar with the program and is looking forward to getting active again. She lives with her husband who is her support person. Her goal for the program is to be able to do more with less SOB.    Expected Outcomes Short Term: Patient will start the program and attend consistently. Long Term: Patient will complete the program meeting personal goals.    Continue Psychosocial Services  Follow up required by staff          Psychosocial Re-Evaluation:   Psychosocial Discharge (Final Psychosocial Re-Evaluation):   Education: Education Goals: Education classes will be provided on a weekly basis, covering required  topics. Participant will state  understanding/return demonstration of topics presented.  Learning Barriers/Preferences:  Learning Barriers/Preferences - 02/20/24 1326       Learning Barriers/Preferences   Learning Barriers None    Learning Preferences Audio;Computer/Internet;Skilled Demonstration;Verbal Instruction;Video;Written Material          Education Topics: Know Your Numbers Group instruction that is supported by a PowerPoint presentation. Instructor discusses importance of knowing and understanding resting, exercise, and post-exercise oxygen saturation, heart rate, and blood pressure. Oxygen saturation, heart rate, blood pressure, rating of perceived exertion, and dyspnea are reviewed along with a normal range for these values.    Exercise for the Pulmonary Patient Group instruction that is supported by a PowerPoint presentation. Instructor discusses benefits of exercise, core components of exercise, frequency, duration, and intensity of an exercise routine, importance of utilizing pulse oximetry during exercise, safety while exercising, and options of places to exercise outside of rehab.    MET Level  Group instruction provided by PowerPoint, verbal discussion, and written material to support subject matter. Instructor reviews what METs are and how to increase METs.    Pulmonary Medications Verbally interactive group education provided by instructor with focus on inhaled medications and proper administration.   Anatomy and Physiology of the Respiratory System Group instruction provided by PowerPoint, verbal discussion, and written material to support subject matter. Instructor reviews respiratory cycle and anatomical components of the respiratory system and their functions. Instructor also reviews differences in obstructive and restrictive respiratory diseases with examples of each.    Oxygen Safety Group instruction provided by PowerPoint, verbal discussion, and written material to support subject  matter. There is an overview of "What is Oxygen" and "Why do we need it".  Instructor also reviews how to create a safe environment for oxygen use, the importance of using oxygen as prescribed, and the risks of noncompliance. There is a brief discussion on traveling with oxygen and resources the patient may utilize.   Oxygen Use Group instruction provided by PowerPoint, verbal discussion, and written material to discuss how supplemental oxygen is prescribed and different types of oxygen supply systems. Resources for more information are provided.    Breathing Techniques Group instruction that is supported by demonstration and informational handouts. Instructor discusses the benefits of pursed lip and diaphragmatic breathing and detailed demonstration on how to perform both.     Risk Factor Reduction Group instruction that is supported by a PowerPoint presentation. Instructor discusses the definition of a risk factor, different risk factors for pulmonary disease, and how the heart and lungs work together.   Pulmonary Diseases Group instruction provided by PowerPoint, verbal discussion, and written material to support subject matter. Instructor gives an overview of the different type of pulmonary diseases. There is also a discussion on risk factors and symptoms as well as ways to manage the diseases.   Stress and Energy Conservation Group instruction provided by PowerPoint, verbal discussion, and written material to support subject matter. Instructor gives an overview of stress and the impact it can have on the body. Instructor also reviews ways to reduce stress. There is also a discussion on energy conservation and ways to conserve energy throughout the day.   Warning Signs and Symptoms Group instruction provided by PowerPoint, verbal discussion, and written material to support subject matter. Instructor reviews warning signs and symptoms of stroke, heart attack, cold and flu. Instructor also  reviews ways to prevent the spread of infection.   Other Education Group or individual verbal, written, or video instructions that support  the educational goals of the pulmonary rehab program.    Knowledge Questionnaire Score:  Knowledge Questionnaire Score - 02/20/24 1411       Knowledge Questionnaire Score   Pre Score 14/18          Core Components/Risk Factors/Patient Goals at Admission:  Personal Goals and Risk Factors at Admission - 02/20/24 1320       Core Components/Risk Factors/Patient Goals on Admission    Weight Management Obesity    Improve shortness of breath with ADL's Yes    Intervention Provide education, individualized exercise plan and daily activity instruction to help decrease symptoms of SOB with activities of daily living.    Expected Outcomes Short Term: Improve cardiorespiratory fitness to achieve a reduction of symptoms when performing ADLs;Long Term: Be able to perform more ADLs without symptoms or delay the onset of symptoms    Diabetes Yes    Intervention Provide education about signs/symptoms and action to take for hypo/hyperglycemia.;Provide education about proper nutrition, including hydration, and aerobic/resistive exercise prescription along with prescribed medications to achieve blood glucose in normal ranges: Fasting glucose 65-99 mg/dL    Expected Outcomes Short Term: Participant verbalizes understanding of the signs/symptoms and immediate care of hyper/hypoglycemia, proper foot care and importance of medication, aerobic/resistive exercise and nutrition plan for blood glucose control.;Long Term: Attainment of HbA1C < 7%.    Heart Failure Yes    Intervention Provide a combined exercise and nutrition program that is supplemented with education, support and counseling about heart failure. Directed toward relieving symptoms such as shortness of breath, decreased exercise tolerance, and extremity edema.    Expected Outcomes Improve functional capacity of  life;Short term: Attendance in program 2-3 days a week with increased exercise capacity. Reported lower sodium intake. Reported increased fruit and vegetable intake. Reports medication compliance.;Short term: Daily weights obtained and reported for increase. Utilizing diuretic protocols set by physician.;Long term: Adoption of self-care skills and reduction of barriers for early signs and symptoms recognition and intervention leading to self-care maintenance.    Hypertension Yes    Intervention Provide education on lifestyle modifcations including regular physical activity/exercise, weight management, moderate sodium restriction and increased consumption of fresh fruit, vegetables, and low fat dairy, alcohol moderation, and smoking cessation.;Monitor prescription use compliance.    Expected Outcomes Short Term: Continued assessment and intervention until BP is < 140/16mm HG in hypertensive participants. < 130/63mm HG in hypertensive participants with diabetes, heart failure or chronic kidney disease.;Long Term: Maintenance of blood pressure at goal levels.    Lipids Yes    Intervention Provide education and support for participant on nutrition & aerobic/resistive exercise along with prescribed medications to achieve LDL 70mg , HDL >40mg .    Expected Outcomes Short Term: Participant states understanding of desired cholesterol values and is compliant with medications prescribed. Participant is following exercise prescription and nutrition guidelines.;Long Term: Cholesterol controlled with medications as prescribed, with individualized exercise RX and with personalized nutrition plan. Value goals: LDL < 70mg , HDL > 40 mg.          Core Components/Risk Factors/Patient Goals Review:    Core Components/Risk Factors/Patient Goals at Discharge (Final Review):    ITP Comments:  ITP Comments     Row Name 02/20/24 1444           ITP Comments Patient arrived for 1st visit/orientation/education at 1300.  Patient was referred to PR by Dr. Jayson Sierras due to DOE/Chronic Diastolic HF. During orientation advised patient on arrival and appointment times what to wear, what  to do before, during and after exercise. Reviewed attendance and class policy.  Pt is scheduled to return Pulmonary Rehab on 02/22/24 at 1430. Pt was advised to come to class 15 minutes before class starts.  Discussed RPE/Dpysnea scales. Patient participated in warm up stretches. Patient was able to complete 6 minute walk test. Patient was measured for the equipment. Discussed equipment safety with patient. Took patient pre-anthropometric measurements. Patient finished visit at 1410.          Comments: Patient arrived for 1st visit/orientation/education at 1300. Patient was referred to PR by Dr. Jayson Sierras due to DOE/Chronic Diastolic HF. During orientation advised patient on arrival and appointment times what to wear, what to do before, during and after exercise. Reviewed attendance and class policy.  Pt is scheduled to return Pulmonary Rehab on 02/22/24 at 1430. Pt was advised to come to class 15 minutes before class starts.  Discussed RPE/Dpysnea scales. Patient participated in warm up stretches. Patient was able to complete 6 minute walk test. Patient was measured for the equipment. Discussed equipment safety with patient. Took patient pre-anthropometric measurements. Patient finished visit at 1410.

## 2024-02-22 ENCOUNTER — Encounter (HOSPITAL_COMMUNITY)

## 2024-02-22 ENCOUNTER — Encounter (HOSPITAL_COMMUNITY): Payer: Self-pay

## 2024-02-22 ENCOUNTER — Encounter (HOSPITAL_COMMUNITY)
Admission: RE | Admit: 2024-02-22 | Discharge: 2024-02-22 | Disposition: A | Discharge status: Home / Self Care | Source: Ambulatory Visit | Attending: Cardiology | Admitting: Cardiology

## 2024-02-22 DIAGNOSIS — I5032 Chronic diastolic (congestive) heart failure: Secondary | ICD-10-CM | POA: Diagnosis not present

## 2024-02-22 DIAGNOSIS — R0609 Other forms of dyspnea: Secondary | ICD-10-CM | POA: Diagnosis not present

## 2024-02-22 LAB — GLUCOSE, CAPILLARY: Glucose-Capillary: 83 mg/dL (ref 70–99)

## 2024-02-22 NOTE — Progress Notes (Signed)
 Pulmonary Individual Treatment Plan  Patient Details  Name: Julie Clay MRN: [id number] Date of Birth: 1940/06/30 Referring Provider:   Flowsheet Row PULMONARY REHAB OTHER RESP ORIENTATION from 02/20/2024 in [facility] CARDIAC REHABILITATION  Referring Provider Debera Savant MD    Initial Encounter Date:  Flowsheet Row PULMONARY REHAB OTHER RESP ORIENTATION from 02/20/2024 in [facility] RE[facility]  Date 02/20/24    Visit Diagnosis: DOE (dyspnea on exertion)  Chronic diastolic heart failure (HCC)  Patient's Home Medications on Admission:   Current Outpatient Medications:    acetaminophen  (TYLENOL ) 500 MG tablet, Take 1,000 mg by mouth every 6 (six) hours as needed for moderate pain., Disp: , Rfl:    albuterol  (VENTOLIN  HFA) 108 (90 Base) MCG/ACT inhaler, Inhale 2 puffs into the lungs every 6 (six) hours as needed for wheezing or shortness of breath., Disp: 8 g, Rfl: 0   Aloe Vera Leaf  POWD, Take 800 mg by mouth 2 (two) times daily., Disp: , Rfl:    Ascorbic Acid  (VITAMIN C ) 1000 MG tablet, Take 1,000 mg by mouth 2 (two) times daily., Disp: , Rfl:    b complex vitamins tablet, Take 1 tablet by mouth 2 (two) times daily. , Disp: , Rfl:    Blood Glucose Monitoring Suppl (BLOOD GLUCOSE SYSTEM PAK) KIT, Use as directed to monitor FSBS 1x daily. Dx: E11.9. Please dispense as Accu-Chek Aviva, Disp: 1 each, Rfl: 1   Calcium  Polycarbophil (FIBER-CAPS PO), Take by mouth daily at 6 (six) AM. 3 qam and Two po QHS, Disp: , Rfl:    Cholecalciferol  (VITAMIN D3) 2000 UNITS TABS, Take 2,000 Units by mouth 2 (two) times daily., Disp: , Rfl:    clotrimazole -betamethasone  (LOTRISONE ) cream, Apply 1 Application topically daily., Disp: 30 g, Rfl: 0   dapagliflozin  propanediol (FARXIGA ) 10 MG TABS tablet, Take 1 tablet (10 mg total) by mouth daily before breakfast., Disp: 90 tablet, Rfl: 3   diclofenac  Sodium (VOLTAREN ) 1 % GEL, Apply 2 g topically 4 (four) times daily. (Patient taking  differently: Apply 2 g topically 4 (four) times daily. prn), Disp: 100 g, Rfl: 1   fluocinonide  cream (LIDEX ) 0.05 %, Apply 1 Application topically 2 (two) times daily as needed (poison ivy/bites/hives)., Disp: 30 g, Rfl: 11   Ginkgo Biloba 120 MG CAPS, Take 120 mg by mouth in the morning and at bedtime. , Disp: , Rfl:    glipiZIDE  (GLUCOTROL ) 5 MG tablet, Take 1 tablet (5 mg total) by mouth daily before breakfast. (Patient not taking: Reported on 02/20/2024), Disp: 30 tablet, Rfl: 0   Glucose Blood (BLOOD GLUCOSE TEST STRIPS) STRP, Use as directed to monitor FSBS 1x daily. Dx: E11.9. Please dispense as Accu-Chek Aviva, Disp: 100 each, Rfl: 1   KRILL OIL PO, Take 1,500 mg by mouth 2 (two) times daily., Disp: , Rfl:    Lancets MISC, Use as directed to monitor FSBS 1x daily. Dx: E11.9. Please dispense as Accu-Chek Aviva, Disp: 100 each, Rfl: 1   loperamide  (IMODIUM  A-D) 2 MG tablet, Take 1 tablet (2 mg total) by mouth 4 (four) times daily as needed for diarrhea or loose stools., Disp: 30 tablet, Rfl: 1   metoprolol  succinate (TOPROL -XL) 25 MG 24 hr tablet, Take 1/2 (one-half) tablet by mouth once daily (Patient taking differently: Take 25 mg by mouth daily. Takes 1/2 tab.), Disp: 45 tablet, Rfl: 3   Multiple Vitamin (MULTIVITAMIN WITH MINERALS) TABS tablet, Take 1 tablet by mouth daily., Disp: , Rfl:    naftifine  (NAFTIN ) 1 %  cream, Apply topically 2 (two) times daily as needed., Disp: 60 g, Rfl: 2   rosuvastatin  (CRESTOR ) 5 MG tablet, Take 1 tablet (5 mg total) by mouth daily. for cholesterol., Disp: 90 tablet, Rfl: 3   Semaglutide , 1 MG/DOSE, (OZEMPIC , 1 MG/DOSE,) 4 MG/3ML SOPN, Inject 1 mg into the skin once a week., Disp: 9 mL, Rfl: 3   simethicone  (MYLICON) 80 MG chewable tablet, Chew 80 mg by mouth as needed for flatulence. , Disp: , Rfl:    tiZANidine  (ZANAFLEX ) 4 MG tablet, TAKE 1 TABLET BY MOUTH EVERY 8 HOURS AS NEEDED FOR MUSCLE SPASM (Patient taking differently: Take 4 mg by mouth at bedtime.  Takes 1/2 at bedtime.), Disp: 90 tablet, Rfl: 0   torsemide  (DEMADEX ) 20 MG tablet, Take 1 tablet (20 mg total) by mouth 3 (three) times a week. (Patient taking differently: Take 20 mg by mouth as directed. 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    Osteoarthritis    both knees, lower back, both shoulders; bone spurs to feet   Rectocele 06/25/2013   Posterior repair 11/06/13    Type II diabetes mellitus (HCC)    Ventral hernia     Tobacco Use: Social History   Tobacco Use  Smoking Status Never  Smokeless Tobacco Never    Labs: Review Flowsheet  More data exists      Latest Ref Rng & Units 12/23/2021 06/18/2022 12/24/2022 06/28/2023 12/26/2023  Labs for ITP Cardiac and Pulmonary Rehab  Cholestrol 100 - 199 mg/dL - 829  - 855  851   LDL (calc) 0 - 99 mg/dL - 78  - 62  71   HDL-C >39 mg/dL - 47  - 44  48   Trlycerides 0 - 149 mg/dL - 719  - 762  829   Hemoglobin A1c 4.8 - 5.6 % 6.5  6.5  6.5  6.4  6.0  5.9     Details       Multiple values from one day are sorted in reverse-chronological order         Capillary Blood Glucose: Lab Results  Component Value Date   GLUCAP 111 (H) 01/02/2020   GLUCAP 83 06/23/2017   GLUCAP 117 (H) 06/22/2017   GLUCAP 81  06/22/2017   GLUCAP 97 06/22/2017     Pulmonary Assessment Scores:  Pulmonary Assessment Scores     Row Name 02/20/24 1409         ADL UCSD   ADL Phase Entry     SOB Score total 77     Rest 0  Walk 2     Stairs 5     Bath 2     Dress 3     Shop 5       CAT Score   CAT Score 17       mMRC Score   mMRC Score 3       UCSD: Self-administered rating of dyspnea associated with activities of daily living (ADLs) 6-point scale (0 = not at all to 5 = maximal or unable to do because of breathlessness)  Scoring Scores range from 0 to 120.  Minimally important difference is 5 units  CAT: CAT can identify the health impairment of COPD patients and is better correlated with disease progression.  CAT has a scoring range of zero to 40. The CAT score is classified into four groups of low (less than 10), medium (10 - 20), high (21-30) and very high (31-40) based on the impact level of disease on health status. A CAT score over 10 suggests significant symptoms.  A worsening CAT score could be explained by an exacerbation, poor medication adherence, poor inhaler technique, or progression of COPD or comorbid conditions.  CAT MCID is 2 points  mMRC: mMRC (Modified Medical Research Council) Dyspnea Scale is used to assess the degree of baseline functional disability in patients of respiratory disease due to dyspnea. No minimal important difference is established. A decrease in score of 1 point or greater is considered a positive change.   Pulmonary Function Assessment:   Exercise Target Goals: Exercise Program Goal: Individual exercise prescription set using results from initial 6 min walk test and THRR while considering  patient's activity barriers and safety.   Exercise Prescription Goal: Initial exercise prescription builds to 30-45 minutes a day of aerobic activity, 2-3 days per week.  Home exercise guidelines will be given to patient during program as part of exercise  prescription that the participant will acknowledge.  Activity Barriers & Risk Stratification:  Activity Barriers & Cardiac Risk Stratification - 02/20/24 1315       Activity Barriers & Cardiac Risk Stratification   Activity Barriers Arthritis;Back Problems;Shortness of Breath;Deconditioning;Other (comment)    Comments spurs in both heels and knees          6 Minute Walk:  6 Minute Walk     Row Name 02/20/24 1412         6 Minute Walk   Phase Initial     Distance 750 feet     Walk Time 6 minutes     # of Rest Breaks 1     MPH 1.42     METS 1.04     RPE 13     Perceived Dyspnea  2     VO2 Peak 3.67     Symptoms Yes (comment)     Comments SOB and BLE pain L knee     Resting HR 70 bpm     Resting BP 120/64     Resting Oxygen Saturation  96 %     Exercise Oxygen Saturation  during 6 min walk 91 %     Max Ex. HR 117 bpm     Max Ex. BP 136/64     2 Minute Post BP 122/62       Interval HR   1 Minute HR 102     2 Minute HR 117     3 Minute HR 110     4 Minute HR 106     5 Minute HR 77  6 Minute HR 110     2 Minute Post HR 78     Interval Heart Rate? Yes       Interval Oxygen   Interval Oxygen? Yes     Baseline Oxygen Saturation % 96 %     1 Minute Oxygen Saturation % 91 %     1 Minute Liters of Oxygen 0 L     2 Minute Oxygen Saturation % 94 %     2 Minute Liters of Oxygen 0 L     3 Minute Oxygen Saturation % 95 %     3 Minute Liters of Oxygen 0 L     4 Minute Oxygen Saturation % 96 %     4 Minute Liters of Oxygen 0 L     5 Minute Oxygen Saturation % 95 %     5 Minute Liters of Oxygen 0 L     6 Minute Oxygen Saturation % 95 %     6 Minute Liters of Oxygen 0 L     2 Minute Post Oxygen Saturation % 96 %     2 Minute Post Liters of Oxygen 0 L        Oxygen Initial Assessment:  Oxygen Initial Assessment - 02/20/24 1419       Home Oxygen   Home Oxygen Device None    Sleep Oxygen Prescription None    Home Exercise Oxygen Prescription None    Home  Resting Oxygen Prescription None      Initial 6 min Walk   Oxygen Used None      Program Oxygen Prescription   Program Oxygen Prescription None      Intervention   Short Term Goals To learn and understand importance of monitoring SPO2 with pulse oximeter and demonstrate accurate use of the pulse oximeter.;To learn and understand importance of maintaining oxygen saturations>88%;To learn and demonstrate proper pursed lip breathing techniques or other breathing techniques. ;To learn and demonstrate proper use of respiratory medications    Long  Term Goals Verbalizes importance of monitoring SPO2 with pulse oximeter and return demonstration;Maintenance of O2 saturations>88%;Exhibits proper breathing techniques, such as pursed lip breathing or other method taught during program session;Compliance with respiratory medication;Demonstrates proper use of MDI's          Oxygen Re-Evaluation:   Oxygen Discharge (Final Oxygen Re-Evaluation):   Initial Exercise Prescription:  Initial Exercise Prescription - 02/20/24 1400       Date of Initial Exercise RX and Referring Provider   Date 02/20/24    Referring Provider Debera Savant MD      NuStep   Level 1    SPM 50    Minutes 15    METs 1.9      Arm Ergometer   Level 1    RPM 50    Minutes 15    METs 1.6      Prescription Details   Frequency (times per week) 3    Duration Progress to 30 minutes of continuous aerobic without signs/symptoms of physical distress      Intensity   THRR 40-80% of Max Heartrate 98-125    Ratings of Perceived Exertion 11-13    Perceived Dyspnea 0-4      Resistance Training   Training Prescription Yes    Weight 3    Reps 10-15          Perform Capillary Blood Glucose checks as needed.  Exercise Prescription Changes:   Exercise Prescription Changes  Row Name 02/20/24 1400             Response to Exercise   Blood Pressure (Admit) 120/64       Blood Pressure (Exercise) 136/64        Blood Pressure (Exit) 122/62       Heart Rate (Admit) 70 bpm       Heart Rate (Exercise) 117 bpm       Heart Rate (Exit) 78 bpm       Oxygen Saturation (Admit) 96 %       Oxygen Saturation (Exercise) 91 %       Oxygen Saturation (Exit) 95 %       Rating of Perceived Exertion (Exercise) 13       Perceived Dyspnea (Exercise) 2          Exercise Comments:   Exercise Goals and Review:   Exercise Goals     Row Name 02/20/24 1416             Exercise Goals   Increase Physical Activity Yes       Intervention Provide advice, education, support and counseling about physical activity/exercise needs.;Develop an individualized exercise prescription for aerobic and resistive training based on initial evaluation findings, risk stratification, comorbidities and participant's personal goals.       Expected Outcomes Short Term: Attend rehab on a regular basis to increase amount of physical activity.;Long Term: Exercising regularly at least 3-5 days a week.;Long Term: Add in home exercise to make exercise part of routine and to increase amount of physical activity.       Increase Strength and Stamina Yes       Intervention Provide advice, education, support and counseling about physical activity/exercise needs.;Develop an individualized exercise prescription for aerobic and resistive training based on initial evaluation findings, risk stratification, comorbidities and participant's personal goals.       Expected Outcomes Short Term: Increase workloads from initial exercise prescription for resistance, speed, and METs.;Long Term: Improve cardiorespiratory fitness, muscular endurance and strength as measured by increased METs and functional capacity ( );Short Term: Perform resistance training exercises routinely during rehab and add in resistance training at home       Able to understand and use rate of perceived exertion (RPE) scale Yes       Intervention Provide education and explanation on how  to use RPE scale       Expected Outcomes Short Term: Able to use RPE daily in rehab to express subjective intensity level;Long Term:  Able to use RPE to guide intensity level when exercising independently       Able to understand and use Dyspnea scale Yes       Intervention Provide education and explanation on how to use Dyspnea scale       Expected Outcomes Short Term: Able to use Dyspnea scale daily in rehab to express subjective sense of shortness of breath during exertion;Long Term: Able to use Dyspnea scale to guide intensity level when exercising independently       Knowledge and understanding of Target Heart Rate Range (THRR) Yes       Intervention Provide education and explanation of THRR including how the numbers were predicted and where they are located for reference       Expected Outcomes Short Term: Able to state/look up THRR;Long Term: Able to use THRR to govern intensity when exercising independently;Short Term: Able to use daily as guideline for intensity in rehab  Able to check pulse independently Yes       Intervention Provide education and demonstration on how to check pulse in carotid and radial arteries.;Review the importance of being able to check your own pulse for safety during independent exercise       Expected Outcomes Long Term: Able to check pulse independently and accurately;Short Term: Able to explain why pulse checking is important during independent exercise       Understanding of Exercise Prescription Yes       Intervention Provide education, explanation, and written materials on patient's individual exercise prescription       Expected Outcomes Short Term: Able to explain program exercise prescription;Long Term: Able to explain home exercise prescription to exercise independently          Exercise Goals Re-Evaluation :   Discharge Exercise Prescription (Final Exercise Prescription Changes):  Exercise Prescription Changes - 02/20/24 1400       Response  to Exercise   Blood Pressure (Admit) 120/64    Blood Pressure (Exercise) 136/64    Blood Pressure (Exit) 122/62    Heart Rate (Admit) 70 bpm    Heart Rate (Exercise) 117 bpm    Heart Rate (Exit) 78 bpm    Oxygen Saturation (Admit) 96 %    Oxygen Saturation (Exercise) 91 %    Oxygen Saturation (Exit) 95 %    Rating of Perceived Exertion (Exercise) 13    Perceived Dyspnea (Exercise) 2          Nutrition:  Target Goals: Understanding of nutrition guidelines, daily intake of sodium 1500mg , cholesterol 200mg , calories 30% from fat and 7% or less from saturated fats, daily to have 5 or more servings of fruits and vegetables.  Biometrics:  Pre Biometrics - 02/20/24 1417       Pre Biometrics   Height 5' 5 (1.651 m)    Weight 216 lb 11.4 oz (98.3 kg)    Waist Circumference 46 inches    Hip Circumference 53.5 inches    Waist to Hip Ratio 0.86 %    BMI (Calculated) 36.06    Grip Strength 20.2 kg    Single Leg Stand 9.54 seconds           Nutrition Therapy Plan and Nutrition Goals:   Nutrition Assessments:  MEDIFICTS Score Key: >=70 Need to make dietary changes  40-70 Heart Healthy Diet <= 40 Therapeutic Level Cholesterol Diet  Flowsheet Row PULMONARY REHAB OTHER RESP ORIENTATION from 02/20/2024 in [facility] CARDIAC REHABILITATION  Picture Your Plate Total Score on Admission 54   Picture Your Plate Scores: <59 Unhealthy dietary pattern with much room for improvement. 41-50 Dietary pattern unlikely to meet recommendations for good health and room for improvement. 51-60 More healthful dietary pattern, with some room for improvement.  >60 Healthy dietary pattern, although there may be some specific behaviors that could be improved.    Nutrition Goals Re-Evaluation:   Nutrition Goals Discharge (Final Nutrition Goals Re-Evaluation):   Psychosocial: Target Goals: Acknowledge presence or absence of significant depression and/or stress, maximize coping skills, provide  positive support system. Participant is able to verbalize types and ability to use techniques and skills needed for reducing stress and depression.  Initial Review & Psychosocial Screening:  Initial Psych Review & Screening - 02/20/24 1438       Initial Review   Current issues with Current Sleep Concerns      Family Dynamics   Good Support System? Yes    Comments Patient's husband is her  main support.      Barriers   Psychosocial barriers to participate in program The patient should benefit from training in stress management and relaxation.      Screening Interventions   Interventions Encouraged to exercise;To provide support and resources with identified psychosocial needs;Provide feedback about the scores to participant    Expected Outcomes Short Term goal: Identification and review with participant of any Quality of Life or Depression concerns found by scoring the questionnaire.;Long Term goal: The participant improves quality of Life and PHQ9 Scores as seen by post scores and/or verbalization of changes;Long Term Goal: Stressors or current issues are controlled or eliminated.;Short Term goal: Utilizing psychosocial counselor, staff and physician to assist with identification of specific Stressors or current issues interfering with healing process. Setting desired goal for each stressor or current issue identified.          Quality of Life Scores:  Scores of 19 and below usually indicate a poorer quality of life in these areas.  A difference of  2-3 points is a clinically meaningful difference.  A difference of 2-3 points in the total score of the Quality of Life Index has been associated with significant improvement in overall quality of life, self-image, physical symptoms, and general health in studies assessing change in quality of life.  PHQ-9: Review Flowsheet  More data exists      02/20/2024 01/03/2024 06/28/2023 05/10/2023 12/29/2022  Depression screen PHQ 2/9  Decreased  Interest 0 0 0 0 0  Down, Depressed, Hopeless 0 0 0 0 0  PHQ - 2 Score 0 0 0 0 0  Altered sleeping 2 0 0 1 -  Tired, decreased energy 3 0 0 2 -  Change in appetite 0 0 0 0 -  Feeling bad or failure about yourself  0 0 0 0 -  Trouble concentrating 0 0 0 0 -  Moving slowly or fidgety/restless 0 0 0 0 -  Suicidal thoughts 0 0 0 0 -  PHQ-9 Score 5 0 0 3 -  Difficult doing work/chores Somewhat difficult Not difficult at all Not difficult at all Not difficult at all -   Interpretation of Total Score  Total Score Depression Severity:  1-4 = Minimal depression, 5-9 = Mild depression, 10-14 = Moderate depression, 15-19 = Moderately severe depression, 20-27 = Severe depression   Psychosocial Evaluation and Intervention:  Psychosocial Evaluation - 02/20/24 1438       Psychosocial Evaluation & Interventions   Interventions Stress management education;Relaxation education;Encouraged to exercise with the program and follow exercise prescription    Comments Patient was referred to pulmonary rehab with DOE and diastolic HF. Her PHQ-9 score was 5 due to trouble staying asleep and energy.  She denies any depression or anxiety. She has been through [place] in 2019 and joined maintenance but did not return after COVID. She retired from a governament position in [employer] . She is already familiar with the program and is looking forward to getting active again. She lives with her husband who is her support person. Her goal for the program is to be able to do more with less SOB.    Expected Outcomes Short Term: Patient will start the program and attend consistently. Long Term: Patient will complete the program meeting personal goals.    Continue Psychosocial Services  Follow up required by staff          Psychosocial Re-Evaluation:   Psychosocial Discharge (Final Psychosocial Re-Evaluation):   Education: Education Goals: Education classes will be  provided on a weekly basis, covering required topics.  Participant will state understanding/return demonstration of topics presented.  Learning Barriers/Preferences:  Learning Barriers/Preferences - 02/20/24 1326       Learning Barriers/Preferences   Learning Barriers None    Learning Preferences Audio;Computer/Internet;Skilled Demonstration;Verbal Instruction;Video;Written Material          Education Topics: Know Your Numbers Group instruction that is supported by a PowerPoint presentation. Instructor discusses importance of knowing and understanding resting, exercise, and post-exercise oxygen saturation, heart rate, and blood pressure. Oxygen saturation, heart rate, blood pressure, rating of perceived exertion, and dyspnea are reviewed along with a normal range for these values.    Exercise for the Pulmonary Patient Group instruction that is supported by a PowerPoint presentation. Instructor discusses benefits of exercise, core components of exercise, frequency, duration, and intensity of an exercise routine, importance of utilizing pulse oximetry during exercise, safety while exercising, and options of places to exercise outside of rehab.    MET Level  Group instruction provided by PowerPoint, verbal discussion, and written material to support subject matter. Instructor reviews what METs are and how to increase METs.    Pulmonary Medications Verbally interactive group education provided by instructor with focus on inhaled medications and proper administration.   Anatomy and Physiology of the Respiratory System Group instruction provided by PowerPoint, verbal discussion, and written material to support subject matter. Instructor reviews respiratory cycle and anatomical components of the respiratory system and their functions. Instructor also reviews differences in obstructive and restrictive respiratory diseases with examples of each.    Oxygen Safety Group instruction provided by PowerPoint, verbal discussion, and written material  to support subject matter. There is an overview of "What is Oxygen" and "Why do we need it".  Instructor also reviews how to create a safe environment for oxygen use, the importance of using oxygen as prescribed, and the risks of noncompliance. There is a brief discussion on traveling with oxygen and resources the patient may utilize.   Oxygen Use Group instruction provided by PowerPoint, verbal discussion, and written material to discuss how supplemental oxygen is prescribed and different types of oxygen supply systems. Resources for more information are provided.    Breathing Techniques Group instruction that is supported by demonstration and informational handouts. Instructor discusses the benefits of pursed lip and diaphragmatic breathing and detailed demonstration on how to perform both.     Risk Factor Reduction Group instruction that is supported by a PowerPoint presentation. Instructor discusses the definition of a risk factor, different risk factors for pulmonary disease, and how the heart and lungs work together.   Pulmonary Diseases Group instruction provided by PowerPoint, verbal discussion, and written material to support subject matter. Instructor gives an overview of the different type of pulmonary diseases. There is also a discussion on risk factors and symptoms as well as ways to manage the diseases.   Stress and Energy Conservation Group instruction provided by PowerPoint, verbal discussion, and written material to support subject matter. Instructor gives an overview of stress and the impact it can have on the body. Instructor also reviews ways to reduce stress. There is also a discussion on energy conservation and ways to conserve energy throughout the day.   Warning Signs and Symptoms Group instruction provided by PowerPoint, verbal discussion, and written material to support subject matter. Instructor reviews warning signs and symptoms of stroke, heart attack, cold and  flu. Instructor also reviews ways to prevent the spread of infection.   Other Education Group or individual  verbal, written, or video instructions that support the educational goals of the pulmonary rehab program.    Knowledge Questionnaire Score:  Knowledge Questionnaire Score - 02/20/24 1411       Knowledge Questionnaire Score   Pre Score 14/18          Core Components/Risk Factors/Patient Goals at Admission:  Personal Goals and Risk Factors at Admission - 02/20/24 1320       Core Components/Risk Factors/Patient Goals on Admission    Weight Management Obesity    Improve shortness of breath with ADL's Yes    Intervention Provide education, individualized exercise plan and daily activity instruction to help decrease symptoms of SOB with activities of daily living.    Expected Outcomes Short Term: Improve cardiorespiratory fitness to achieve a reduction of symptoms when performing ADLs;Long Term: Be able to perform more ADLs without symptoms or delay the onset of symptoms    Diabetes Yes    Intervention Provide education about signs/symptoms and action to take for hypo/hyperglycemia.;Provide education about proper nutrition, including hydration, and aerobic/resistive exercise prescription along with prescribed medications to achieve blood glucose in normal ranges: Fasting glucose 65-99 mg/dL    Expected Outcomes Short Term: Participant verbalizes understanding of the signs/symptoms and immediate care of hyper/hypoglycemia, proper foot care and importance of medication, aerobic/resistive exercise and nutrition plan for blood glucose control.;Long Term: Attainment of HbA1C < 7%.    Heart Failure Yes    Intervention Provide a combined exercise and nutrition program that is supplemented with education, support and counseling about heart failure. Directed toward relieving symptoms such as shortness of breath, decreased exercise tolerance, and extremity edema.    Expected Outcomes Improve  functional capacity of life;Short term: Attendance in program 2-3 days a week with increased exercise capacity. Reported lower sodium intake. Reported increased fruit and vegetable intake. Reports medication compliance.;Short term: Daily weights obtained and reported for increase. Utilizing diuretic protocols set by physician.;Long term: Adoption of self-care skills and reduction of barriers for early signs and symptoms recognition and intervention leading to self-care maintenance.    Hypertension Yes    Intervention Provide education on lifestyle modifcations including regular physical activity/exercise, weight management, moderate sodium restriction and increased consumption of fresh fruit, vegetables, and low fat dairy, alcohol moderation, and smoking cessation.;Monitor prescription use compliance.    Expected Outcomes Short Term: Continued assessment and intervention until BP is < 140/29mm HG in hypertensive participants. < 130/75mm HG in hypertensive participants with diabetes, heart failure or chronic kidney disease.;Long Term: Maintenance of blood pressure at goal levels.    Lipids Yes    Intervention Provide education and support for participant on nutrition & aerobic/resistive exercise along with prescribed medications to achieve LDL 70mg , HDL >40mg .    Expected Outcomes Short Term: Participant states understanding of desired cholesterol values and is compliant with medications prescribed. Participant is following exercise prescription and nutrition guidelines.;Long Term: Cholesterol controlled with medications as prescribed, with individualized exercise RX and with personalized nutrition plan. Value goals: LDL < 70mg , HDL > 40 mg.          Core Components/Risk Factors/Patient Goals Review:    Core Components/Risk Factors/Patient Goals at Discharge (Final Review):    ITP Comments:  ITP Comments     Row Name 02/20/24 1444 02/22/24 0836         ITP Comments Patient arrived for 1st  visit/orientation/education at 1300. Patient was referred to PR by Dr. Jayson Sierras due to DOE/Chronic Diastolic HF. During orientation advised patient on arrival  and appointment times what to wear, what to do before, during and after exercise. Reviewed attendance and class policy.  Pt is scheduled to return Pulmonary Rehab on 02/22/24 at 1430. Pt was advised to come to class 15 minutes before class starts.  Discussed RPE/Dpysnea scales. Patient participated in warm up stretches. Patient was able to complete 6 minute walk test. Patient was measured for the equipment. Discussed equipment safety with patient. Took patient pre-anthropometric measurements. Patient finished visit at 1410. 30 day review completed. ITP sent to Dr.Jehanzeb Memon, Medical Director of  Pulmonary Rehab. Continue with ITP unless changes are made by physician.  New to the program.         Comments: 30 day review

## 2024-02-22 NOTE — Progress Notes (Signed)
 Pt felt dizzy after he stopped exercising. Checked all vitals, WNL. 108/80, 93%, 90. Blood sugar 98. Advised patient if this is new, which it was, that he should go to the ED to get checked. Pt refused. Also stated that he hasn't ate all day. Advised to go home and eat, rest, hydrate and go to ER if not any better.

## 2024-02-22 NOTE — Progress Notes (Signed)
 Daily Session Note  Patient Details  Name: Julie Clay MRN: [id number] Date of Birth: 1940/12/13 Referring Provider:   Flowsheet Row PULMONARY REHAB OTHER RESP ORIENTATION from 02/20/2024 in [facility] CARDIAC REHABILITATION  Referring Provider Debera Savant MD    Encounter Date: 02/22/2024  Check In:  Session Check In - 02/22/24 1409       Check-In   Supervising physician immediately available to respond to emergencies See telemetry face sheet for immediately available MD    Location [facility]    Staff Present Laymon Rattler, BSN, RN, Oddis Louder, RN, BSN;Kyonna Frier BSN, RN    Virtual Visit No    Medication changes reported     No    Fall or balance concerns reported    No    Tobacco Cessation No Change    Warm-up and Cool-down Performed on first and last piece of equipment    Resistance Training Performed Yes    VAD Patient? No    PAD/SET Patient? No      Pain Assessment   Currently in Pain? No/denies    Multiple Pain Sites No          Capillary Blood Glucose: No results found for this or any previous visit (from the past 24 hours).    Social History   Tobacco Use  Smoking Status Never  Smokeless Tobacco Never    Goals Met:  Proper associated with RPD/PD & O2 Sat Independence with exercise equipment Using PLB without cueing & demonstrates good technique Exercise tolerated well Queuing for purse lip breathing No report of concerns or symptoms today Strength training completed today  Goals Unmet:  Not Applicable  Comments: SABRASABRAFirst full day of exercise!  Patient was oriented to gym and equipment including functions, settings, policies, and procedures.  Patient's individual exercise prescription and treatment plan were reviewed.  All starting workloads were established based on the results of the 6 minute walk test done at initial orientation visit.  The plan for exercise progression was also introduced and progression  will be customized based on patient's performance and goals.

## 2024-02-24 ENCOUNTER — Encounter (HOSPITAL_COMMUNITY)

## 2024-02-24 ENCOUNTER — Encounter (HOSPITAL_COMMUNITY)
Admission: RE | Admit: 2024-02-24 | Discharge: 2024-02-24 | Disposition: A | Discharge status: Home / Self Care | Source: Ambulatory Visit | Attending: Cardiology | Admitting: Cardiology

## 2024-02-24 DIAGNOSIS — R0609 Other forms of dyspnea: Secondary | ICD-10-CM

## 2024-02-24 DIAGNOSIS — I5032 Chronic diastolic (congestive) heart failure: Secondary | ICD-10-CM | POA: Diagnosis not present

## 2024-02-24 LAB — GLUCOSE, CAPILLARY
Glucose-Capillary: 92 mg/dL (ref 70–99)
Glucose-Capillary: 87 mg/dL (ref 70–99)

## 2024-02-24 NOTE — Progress Notes (Signed)
 Daily Session Note  Patient Details  Name: Julie Clay MRN: [id number] Date of Birth: 1941/03/03 Referring Provider:   Flowsheet Row PULMONARY REHAB OTHER RESP ORIENTATION from 02/20/2024 in [facility] CARDIAC REHABILITATION  Referring Provider Debera Savant MD    Encounter Date: 02/24/2024  Check In:  Session Check In - 02/24/24 1300       Check-In   Supervising physician immediately available to respond to emergencies See telemetry face sheet for immediately available ER MD    Location [facility]    Staff Present Adrien Louder, RN, BSN;Victoria Zina, RN;Heather Con, [place], Exercise Physiologist    Virtual Visit No    Medication changes reported     No    Fall or balance concerns reported    No    Warm-up and Cool-down Performed as group-led instruction    Resistance Training Performed Yes    VAD Patient? No    PAD/SET Patient? No      Pain Assessment   Currently in Pain? No/denies    Multiple Pain Sites No          Capillary Blood Glucose: No results found for this or any previous visit (from the past 24 hours).    Social History   Tobacco Use  Smoking Status Never  Smokeless Tobacco Never    Goals Met:  Proper associated with RPD/PD & O2 Sat Independence with exercise equipment Using PLB without cueing & demonstrates good technique Exercise tolerated well No report of concerns or symptoms today Strength training completed today  Goals Unmet:  Not Applicable  Comments: Pt able to follow exercise prescription today without complaint.  Will continue to monitor for progression.

## 2024-02-27 ENCOUNTER — Encounter (HOSPITAL_COMMUNITY): Payer: Self-pay

## 2024-02-27 ENCOUNTER — Emergency Department (HOSPITAL_COMMUNITY)

## 2024-02-27 ENCOUNTER — Ambulatory Visit: Payer: Self-pay | Admitting: *Deleted

## 2024-02-27 ENCOUNTER — Emergency Department (HOSPITAL_COMMUNITY)
Admission: EM | Admit: 2024-02-27 | Discharge: 2024-02-27 | Disposition: A | Discharge status: Home / Self Care | Source: Ambulatory Visit | Attending: Emergency Medicine | Admitting: Emergency Medicine

## 2024-02-27 ENCOUNTER — Other Ambulatory Visit: Payer: Self-pay

## 2024-02-27 ENCOUNTER — Encounter (HOSPITAL_COMMUNITY)
Admission: RE | Admit: 2024-02-27 | Discharge: 2024-02-27 | Disposition: A | Discharge status: Home / Self Care | Source: Ambulatory Visit | Attending: Cardiology | Admitting: Cardiology

## 2024-02-27 ENCOUNTER — Encounter (HOSPITAL_COMMUNITY)

## 2024-02-27 DIAGNOSIS — H538 Other visual disturbances: Secondary | ICD-10-CM | POA: Diagnosis not present

## 2024-02-27 DIAGNOSIS — H5789 Other specified disorders of eye and adnexa: Secondary | ICD-10-CM | POA: Diagnosis present

## 2024-02-27 DIAGNOSIS — H539 Unspecified visual disturbance: Secondary | ICD-10-CM | POA: Insufficient documentation

## 2024-02-27 DIAGNOSIS — H5440 Blindness, one eye, unspecified eye: Secondary | ICD-10-CM | POA: Diagnosis not present

## 2024-02-27 LAB — BASIC METABOLIC PANEL WITH GFR
Anion gap: 11 (ref 5–15)
BUN: 17 mg/dL (ref 8–23)
CO2: 28 mmol/L (ref 22–32)
Calcium: 9.7 mg/dL (ref 8.9–10.3)
Chloride: 105 mmol/L (ref 98–111)
Creatinine, Ser: 0.81 mg/dL (ref 0.44–1.00)
GFR, Estimated: >60 mL/min (ref >60)
Glucose, Bld: 145 mg/dL — ABNORMAL HIGH (ref 70–99)
Potassium: 4 mmol/L (ref 3.5–5.1)
Sodium: 143 mmol/L (ref 135–145)

## 2024-02-27 LAB — SEDIMENTATION RATE: Sed Rate: 23 mm/h (ref 0–30)

## 2024-02-27 LAB — C-REACTIVE PROTEIN: CRP: 1.1 mg/dL — ABNORMAL HIGH (ref <1.0)

## 2024-02-27 MED ORDER — PROCHLORPERAZINE EDISYLATE 10 MG/2ML IJ SOLN
5.0000 mg | Freq: Once | INTRAMUSCULAR | Status: AC
  Administered 2024-02-27: 5 mg via INTRAVENOUS
  Filled 2024-02-27: qty 2

## 2024-02-27 MED ORDER — IOHEXOL 350 MG/ML SOLN
75.0000 mL | Freq: Once | INTRAVENOUS | Status: AC | PRN
  Administered 2024-02-27: 75 mL via INTRAVENOUS

## 2024-02-27 NOTE — ED Provider Notes (Signed)
 [place] EMERGENCY DEPARTMENT AT [facility] Provider Note   CSN: [id number] Arrival date & time: 02/27/24  1345     Patient presents with: Eye Problem   Julie Clay is a 83 y.o. female.    Eye Problem Patient presents with vision changes in right eye.  Began around 1030 this morning.  Reported went to ophthalmologist.  States that eye issues were ruled out.  Reportedly had good vessels this well with could be seen.  Reportedly no retinal detachment.  Although I do not have access to the records on this.  States she was told to come to the ER and told to follow-up with PCP.  Had decreased vision on the upper aspect of her right eye.  Right eye only.  States that it was initially gray but now vision is somewhat better.  Will have some lightening shooting across it at times.  Does not have a headache.  Was told it could be potentially a migraine equivalent.  Has had swirly vision in her eyes before and thought maybe was migraine.  No other numbness or weakness.     Prior to Admission medications   Medication Sig Start Date End Date Taking? Authorizing Provider  acetaminophen  (TYLENOL ) 500 MG tablet Take 1,000 mg by mouth every 6 (six) hours as needed for moderate pain.   Yes [provider]  albuterol  (VENTOLIN  HFA) 108 (90 Base) MCG/ACT inhaler Inhale 2 puffs into the lungs every 6 (six) hours as needed for wheezing or shortness of breath. 04/09/20  Yes [place], Theodoro FALCON, MD  Aloe Vera Leaf  POWD Take 800 mg by mouth 2 (two) times daily.   Yes [provider]  Ascorbic Acid  (VITAMIN C ) 1000 MG tablet Take 1,000 mg by mouth 2 (two) times daily.   Yes [provider]  b complex vitamins tablet Take 1 tablet by mouth 2 (two) times daily.    Yes [provider]  Calcium  Polycarbophil (FIBER-CAPS PO) Take 2-3 capsules by mouth in the morning and at bedtime.   Yes [provider]  Cholecalciferol  (VITAMIN D3) 2000 UNITS TABS Take  2,000 Units by mouth 2 (two) times daily.   Yes [provider]  clotrimazole -betamethasone  (LOTRISONE ) cream Apply 1 Application topically daily. Patient taking differently: Apply 1 Application topically daily as needed (groin rash). 01/03/24  Yes Tobie Suzzane POUR, MD  dapagliflozin  propanediol (FARXIGA ) 10 MG TABS tablet Take 1 tablet (10 mg total) by mouth daily before breakfast. 06/28/23  Yes Patel, Rutwik K, MD  fluocinonide  cream (LIDEX ) 0.05 % Apply 1 Application topically 2 (two) times daily as needed (poison ivy/bites/hives). 06/25/22  Yes Tobie Suzzane POUR, MD  Ginkgo Biloba 120 MG CAPS Take 120 mg by mouth in the morning and at bedtime.    Yes [provider]  KRILL OIL PO Take 1,500 mg by mouth 2 (two) times daily.   Yes [provider]  metoprolol  succinate (TOPROL -XL) 25 MG 24 hr tablet Take 1/2 (one-half) tablet by mouth once daily Patient taking differently: Take 12.5 mg by mouth daily. 06/16/23  Yes Debera Jayson MATSU, MD  Multiple Vitamin (MULTIVITAMIN WITH MINERALS) TABS tablet Take 1 tablet by mouth daily.   Yes [provider]  naftifine  (NAFTIN ) 1 % cream Apply topically 2 (two) times daily as needed. Patient taking differently: Apply 1 Application topically 2 (two) times daily as needed (between toes itching). 12/04/19  Yes [place], Theodoro FALCON, MD  olopatadine (PATADAY) 0.1 % ophthalmic solution Place 1  drop into both eyes daily as needed for allergies.   Yes [provider]  Probiotic Product (PROBIOTIC ADVANCED PO) Take 1 capsule by mouth daily.   Yes [provider]  psyllium (REGULOID) 0.52 g capsule Take 2-3 capsules by mouth in the morning and at bedtime. 3 caps am, 2 caps pm   Yes [provider]  rosuvastatin  (CRESTOR ) 5 MG tablet Take 1 tablet (5 mg total) by mouth daily. for cholesterol. 01/03/24  Yes Tobie Suzzane POUR, MD  Semaglutide , 1 MG/DOSE, (OZEMPIC , 1 MG/DOSE,) 4 MG/3ML SOPN Inject 1 mg into the skin once a  week. 01/03/24  Yes Tobie Suzzane POUR, MD  simethicone  (MYLICON) 80 MG chewable tablet Chew 80 mg by mouth as needed for flatulence.    Yes [provider]  tiZANidine  (ZANAFLEX ) 4 MG tablet TAKE 1 TABLET BY MOUTH EVERY 8 HOURS AS NEEDED FOR MUSCLE SPASM Patient taking differently: Take 2 mg by mouth at bedtime. 02/06/24  Yes Tobie Suzzane POUR, MD  Blood Glucose Monitoring Suppl (BLOOD GLUCOSE SYSTEM PAK) KIT Use as directed to monitor FSBS 1x daily. Dx: E11.9. Please dispense as Accu-Chek Aviva 01/18/18   [place], Theodoro FALCON, MD  Glucose Blood (BLOOD GLUCOSE TEST STRIPS) STRP Use as directed to monitor FSBS 1x daily. Dx: E11.9. Please dispense as Accu-Chek Aviva 01/18/18   Bari Theodoro FALCON, MD  Lancets MISC Use as directed to monitor FSBS 1x daily. Dx: E11.9. Please dispense as Accu-Chek Aviva 01/18/18   Bari Theodoro FALCON, MD    Allergies: Adhesive [tape], Arimidex  [anastrozole ], Other, Tetanus toxoid-containing vaccines, Tetracyclines & related, and Lisinopril     Review of Systems  Updated Vital Signs BP (!) 160/57   Pulse 65   Temp 98.7 F (37.1 C)   Resp (!) 26   Ht 5' 5 (1.651 m)   Wt 98.3 kg   SpO2 96%   BMI 36.06 kg/m   Physical Exam Vitals and nursing note reviewed.  Eyes:     Extraocular Movements: Extraocular movements intact.     Pupils: Pupils are equal, round, and reactive to light.  Cardiovascular:     Rate and Rhythm: Regular rhythm.  Musculoskeletal:     Cervical back: Neck supple.  Neurological:     General: No focal deficit present.     Mental Status: She is alert.     Comments: Eye movements intact.  Visual fields grossly intact by confrontation.  Pupils reactive.     (all labs ordered are listed, but only abnormal results are displayed) Labs Reviewed  BASIC METABOLIC PANEL WITH GFR - Abnormal; Notable for the following components:      Result Value   Glucose, Bld 145 (*)    All other components within normal limits  C-REACTIVE PROTEIN - Abnormal;  Notable for the following components:   CRP 1.1 (*)    All other components within normal limits  SEDIMENTATION RATE    EKG: None  Radiology: CT ANGIO HEAD NECK W WO CM Result Date: 02/27/2024 EXAM: CTA HEAD AND NECK WITH AND WITHOUT 02/27/2024 04:31:02 PM TECHNIQUE: CTA of the head and neck was performed with and without the administration of 75 mL of intravenous contrast (iohexol  (OMNIPAQUE ) 350 MG/ML injection 75 mL IOHEXOL  350 MG/ML SOLN). Multiplanar 2D and/or 3D reformatted images are provided for review. Automated exposure control, iterative reconstruction, and/or weight based adjustment of the mA/kV was utilized to reduce the radiation dose to as low as reasonably achievable. Stenosis of the internal carotid arteries measured using  NASCET criteria. COMPARISON: None available CLINICAL HISTORY: Vision loss, monocular. FINDINGS: CTA NECK: AORTIC ARCH AND ARCH VESSELS: Mild atherosclerosis of the visualized aortic arch. Common origin of the brachiocephalic and left common carotid arteries. The subclavian arteries are patent bilaterally. No dissection or arterial injury. No significant stenosis of the brachiocephalic or subclavian arteries. CERVICAL CAROTID ARTERIES: The right carotid artery is patent from the origin to the skull base with no hemodynamically significant stenosis. There is subtle irregularity along the mid and distal right cervical ICA which could reflect vasospasm versus changes related to fibromuscular dysplasia. The left carotid artery is patent from the origin to the skull base with minimal atherosclerosis at the carotid bifurcation and no hemodynamically significant stenosis. Overall, there is no dissection, arterial injury, or hemodynamically significant stenosis by NASCET criteria. CERVICAL VERTEBRAL ARTERIES: The vertebral arteries are visualized bilaterally. No dissection, arterial injury, or significant stenosis. LUNGS AND MEDIASTINUM: Partially visualized right chest wall  pacer device. SOFT TISSUES: There is a 1.1 cm nodule in the right thyroid  lobe. BONES: Changes in the visualized spine. CTA HEAD: ANTERIOR CIRCULATION: The intracranial internal carotid arteries are patent bilaterally with minimal atherosclerosis along the carotid siphons without stenosis. The anterior cerebral arteries are patent bilaterally and are primarily supplied via the left ICA. 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 The middle cerebral arteries are patent bilaterally. No aneurysm. POSTERIOR CIRCULATION: No significant stenosis of the posterior cerebral arteries. No significant stenosis of the basilar artery. No significant stenosis of the intracranial vertebral arteries. No aneurysm. OTHER: No acute intracranial hemorrhage. Small focus of remote infarct in the posterior superior left cerebellum. Atherosclerosis at the skull base. No edema, mass effect, or midline shift. The basilar cisterns are patent. Bilateral lens replacement. Mucosal thickening in the ethmoid and sphenoid sinuses. No dural venous sinus thrombosis on this non-dedicated study. IMPRESSION: 1. No acute intracranial abnormality. 2. No large vessel occlusion, high-grade stenosis, or aneurysm of the arteries in the head and neck. 3. 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 4. Subtle irregularity along the mid and distal right cervical ICA, possibly vasospasm or fibromuscular dysplasia. Electronically signed by: Donnice Mania MD 02/27/2024 05:31 PM EST RP Workstation: [id number]     Procedures   Medications Ordered in the ED  prochlorperazine (COMPAZINE) injection 5 mg (5 mg Intravenous Given 02/27/24 1506)  iohexol  (OMNIPAQUE ) 350 MG/ML injection 75 mL (75 mLs Intravenous Contrast Given 02/27/24 1600)                                    Medical Decision Making Amount and/or Complexity of Data Reviewed Labs: ordered. Radiology: ordered.  Risk Prescription drug  management.   Patient with vision changes.  Only right eye and only upper visual field.  Improving during the day.  Potentially could be migraine.  However also causes such as vascular issues such as arteritis considered.  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  Will get CT a.  Will treat as possible migraine.  Improving somewhat after treatment.  CTA reassuring.  No large vessel occlusion.  Does have some carotid findings.  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  However I think this is less likely an acute stroke.  I think reasonable to follow-up with neurology as an outpatient.  Discussed with patient.     Final diagnoses:  Change in vision    ED Discharge  Orders          Ordered    Ambulatory referral to Neurology       Comments: An appointment is requested in approximately: 1 week   02/27/24 1738               Patsey Lot, MD 02/27/24 1958

## 2024-02-27 NOTE — ED Triage Notes (Signed)
 Pt report a grey cloud in the rt eye starting around 1030 this morning. Pt states she went to the ophalmologist had pressure checked in her eyes and they stated her eyes were fine. Pt called PCP and he informed her to come to the ED.

## 2024-02-27 NOTE — ED Notes (Signed)
 Dr. Patsey notified of pt at this time.

## 2024-02-27 NOTE — Discharge Instructions (Signed)
 Watch for worsening symptoms.  Follow-up with neurology

## 2024-02-27 NOTE — Telephone Encounter (Signed)
 FYI Only or Action Required?: FYI only for provider: ED advised.  Patient was last seen in primary care on 01/03/2024 by Tobie Suzzane POUR, MD.  Called Nurse Triage reporting Loss of Vision.  Symptoms began today. 1030 am   Interventions attempted: Other: went to eye dr for exam.  Symptoms are: gradually improving.  Triage Disposition: Go to ED Now (or PCP Triage)  Patient/caregiver understands and will follow disposition?: Yes  Recommended ED now. Patient is requesting if PCP can assess her in office. No other stroke like sx reported. Again recommended ED. Patient requesting call from PCP.   Called [facility] to inform patient request and no answer at this time. Please advise.            Copied from CRM [id number]. Topic: Clinical - Red Word Triage >> Feb 27, 2024  1:11 PM Darshell M wrote: Red Word that prompted transfer to Nurse Triage: Patient concerned she had a stroke. She was unable to see out of the bottom of the right eye is okay but she is unable to see out of the top part of the eye. Went to the eye doctor and he advised her it was not the eye. Patient wants to know can a stroke be diagnosed in the office or does she have to go to the ER. Reason for Disposition  [1] Blurred vision or visual changes AND [2] present now AND [3] sudden onset or new (e.g., minutes, hours, days)  (Exception: Seeing floaters / black specks OR previously diagnosed migraine headaches with same symptoms.)  Answer Assessment - Initial Assessment Questions Recommended ED now . Patient reports she went to eye dr today due loss of vision in upper 1/2 of right eye. Looks gray in color now. Reports it is getter better than when started at 1030 am today. Patient requesting if PCP can assess her in office instead of ED. Recommended ED again. Please advise.   Attempted to contact [facility] and no answer.    1. DESCRIPTION: How has your vision changed? (e.g., complete vision loss, blurred vision, double  vision, floaters, etc.)     Loss of vision in upper portion of right eye. Horizontal upper 1/2 of eye no vision but reports getter better now .  2. LOCATION: One or both eyes? If one, ask: Which eye?     Right eye  3. SEVERITY: Can you see anything? If Yes, ask: What can you see? (e.g., fine print)     Only sees lower portion of right eye  4. ONSET: When did this begin? Did it start suddenly or has this been gradual?     10 30 am  5. PATTERN: Does this come and go, or has it been constant since it started?     Constant now but reports getting better 6. PAIN: Is there any pain in your eye(s)?  (Scale 1-10; or mild, moderate, severe)     No pain  7. CONTACTS-GLASSES: Do you wear contacts or glasses?     Glasses only to read  8. CAUSE: What do you think is causing this visual problem?     Not sure. Went to eye dr and reports it is not retinal issues. See gray upper 1/2 of right eye 9. OTHER SYMPTOMS: Do you have any other symptoms? (e.g., confusion, headache, arm or leg weakness, speech problems)     No other sx . No headache reported no weakness either side of body no N/T no speech issues.  10. PREGNANCY:  Is there any chance you are pregnant? When was your last menstrual period?       na  Protocols used: Vision Loss or Change-A-AH

## 2024-02-29 ENCOUNTER — Encounter (HOSPITAL_COMMUNITY)

## 2024-03-01 ENCOUNTER — Telehealth: Payer: Self-pay | Admitting: Neurology

## 2024-03-01 ENCOUNTER — Encounter: Payer: Self-pay | Admitting: Neurology

## 2024-03-01 ENCOUNTER — Ambulatory Visit: Admitting: Neurology

## 2024-03-01 VITALS — BP 142/78 | HR 89 | Ht 65.0 in | Wt 216.0 lb

## 2024-03-01 DIAGNOSIS — H539 Unspecified visual disturbance: Secondary | ICD-10-CM | POA: Diagnosis not present

## 2024-03-01 DIAGNOSIS — I639 Cerebral infarction, unspecified: Secondary | ICD-10-CM

## 2024-03-01 DIAGNOSIS — H53451 Other localized visual field defect, right eye: Secondary | ICD-10-CM

## 2024-03-01 MED ORDER — CLOPIDOGREL BISULFATE 75 MG PO TABS: 75.0000 mg | ORAL_TABLET | Freq: Every day | ORAL | 11 refills | Status: DC

## 2024-03-01 MED ORDER — ASPIRIN 81 MG PO TBEC: 81.0000 mg | DELAYED_RELEASE_TABLET | Freq: Every day | ORAL | 0 refills | Status: AC

## 2024-03-01 NOTE — Progress Notes (Signed)
 [facility] [address] [place]. [place] [address] [phone]       OFFICE CONSULT NOTE  [name] Date of Birth:  1941/01/29 Medical Record Number:  [id number]   Referring MD: [place]  Reason for Referral: Right eye vision disturbance  HPI: [name] is a pleasant 83 year old Caucasian lady seen today for initial office consultation visit.  She accompanied by husband.  History is obtained from them and review of electronic medical records.  I personally reviewed pertinent available imaging films in PACS.  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  Patient developed sudden onset of vision disturbance in the right eye on 02/27/2024.  This started at 10:30 in the morning and lasted most of the day but improved by evening.  She is describes this involving only the upper portion of the right eye initially of blurred feeling and subsequently she started seeing purple color.  This was altitudinal and never involved the lower portion of the eye.  She went and saw optometrist in [place] I do not have the results apparently did not find any major issues with the eye.  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  CT angiogram of the brain and neck showed no large vessel stenosis or occlusion.  ESR was 23 mm.  C-reactive protein was minimally elevated at 1.1.  It was not sure if patient has a MRI compatible pacemaker hence it was not done.  LDL cholesterol on 12/26/2023 was 71 mg percent and hemoglobin A1c was 5.9.  Patient was referred to neurology for evaluation of possible stroke affecting the eye.  Patient denies any eye pain, double vision.   She has no prior history of focal neurological symptoms suggestive of a stroke or a TIA.  She still has had intermittent visual disturbance in the right eye involving only the upper portion.  At times she sees purple color in the right eye particularly in bright light.  Yesterday she had 2 transient episodes but this morning she is seen with vision disturbance most of the time.  She has a pacemaker but has no documented history of atrial fibrillation yet..  ROS:   14 system review of systems is positive for vision disturbance only all other systems negative  PMH:  Past Medical History:  Diagnosis Date   2nd degree AV block 06/22/2017   Arthritis    Asthma    Bone spur    Breast cancer, left breast (HCC)    2012   Breast cancer, right breast (HCC)    2021   Chronic bronchitis (HCC)    COPD (chronic obstructive pulmonary disease) (HCC)    Diabetes mellitus without complication (HCC)    Phreesia 04/08/2020   Diastolic heart failure (HCC)    Essential hypertension    Fatty liver 2013   GERD (gastroesophageal reflux disease)    HSV (herpes simplex virus) infection    Hyperlipidemia    IBS (irritable bowel syndrome)    Lung nodule seen on imaging study 2013   Lymphedema    Left arm   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    Osteoarthritis    both knees, lower back, both  shoulders; bone spurs to feet   Rectocele 06/25/2013   Posterior repair 11/06/13    Type II diabetes mellitus (HCC)    Ventral hernia     Social History:  Social History   Socioeconomic History   Marital status: Married    Spouse name: Julie Clay   Number of children: 2   Years of education: 12   Highest education level: Some college, no degree  Occupational History   Occupation: retired     Comment: advice worker for [employer]  Tobacco Use   Smoking status: Never   Smokeless tobacco: Never  Vaping Use   Vaping status: Never Used  Substance and Sexual Activity   Alcohol use: Not Currently     Comment: Twice a year   Drug use: No   Sexual activity: Not Currently    Birth control/protection: Surgical  Other Topics Concern   Not on file  Social History Narrative   Right handed   Caffeine use: 1 mug coffee per day. Tea sometimes. Soda during summer/once a month   Social Drivers of Corporate Investment Banker Strain: Low Risk  (05/10/2023)   Overall Financial Resource Strain (CARDIA)    Difficulty of Paying Living Expenses: Not hard at all  Food Insecurity: No Food Insecurity (05/10/2023)   Hunger Vital Sign    Worried About Running Out of Food in the Last Year: Never true    Ran Out of Food in the Last Year: Never true  Transportation Needs: No Transportation Needs (05/10/2023)   PRAPARE - Administrator, Civil Service (Medical): No    Lack of Transportation (Non-Medical): No  Physical Activity: Inactive (05/10/2023)   Exercise Vital Sign    Days of Exercise per Week: 0 days    Minutes of Exercise per Session: 0 min  Stress: No Stress Concern Present (05/10/2023)   [employer] of Occupational Health - Occupational Stress Questionnaire    Feeling of Stress : Not at all  Social Connections: Socially Integrated (05/10/2023)   Social Connection and Isolation Panel    Frequency of Communication with Friends and Family: More than three times a week    Frequency of Social Gatherings with Friends and Family: More than three times a week    Attends Religious Services: More than 4 times per year    Active Member of [employer] or Organizations: Yes    Attends Engineer, Structural: More than 4 times per year    Marital Status: Married  Catering Manager Violence: Not At Risk (05/10/2023)   Humiliation, Afraid, Rape, and Kick questionnaire    Fear of Current or Ex-Partner: No    Emotionally Abused: No    Physically Abused: No    Sexually Abused: No    Medications:   Current Outpatient Medications on File Prior to Visit  Medication Sig Dispense Refill    acetaminophen  (TYLENOL ) 500 MG tablet Take 1,000 mg by mouth every 6 (six) hours as needed for moderate pain.     albuterol  (VENTOLIN  HFA) 108 (90 Base) MCG/ACT inhaler Inhale 2 puffs into the lungs every 6 (six) hours as needed for wheezing or shortness of breath. 8 g 0   Aloe Vera Leaf  POWD Take 800 mg by mouth 2 (two) times daily.     Ascorbic Acid  (VITAMIN C ) 1000 MG tablet Take 1,000 mg by mouth 2 (two) times daily.     b complex vitamins tablet Take 1 tablet by mouth 2 (two) times daily.  Blood Glucose Monitoring Suppl (BLOOD GLUCOSE SYSTEM PAK) KIT Use as directed to monitor FSBS 1x daily. Dx: E11.9. Please dispense as Accu-Chek Aviva 1 each 1   Calcium  Polycarbophil (FIBER-CAPS PO) Take 2-3 capsules by mouth in the morning and at bedtime.     Cholecalciferol  (VITAMIN D3) 2000 UNITS TABS Take 2,000 Units by mouth 2 (two) times daily.     clotrimazole -betamethasone  (LOTRISONE ) cream Apply 1 Application topically daily. (Patient taking differently: Apply 1 Application topically daily as needed (groin rash).) 30 g 0   dapagliflozin  propanediol (FARXIGA ) 10 MG TABS tablet Take 1 tablet (10 mg total) by mouth daily before breakfast. 90 tablet 3   fluocinonide  cream (LIDEX ) 0.05 % Apply 1 Application topically 2 (two) times daily as needed (poison ivy/bites/hives). 30 g 11   Ginkgo Biloba 120 MG CAPS Take 120 mg by mouth in the morning and at bedtime.      Glucose Blood (BLOOD GLUCOSE TEST STRIPS) STRP Use as directed to monitor FSBS 1x daily. Dx: E11.9. Please dispense as Accu-Chek Aviva 100 each 1   KRILL OIL PO Take 1,500 mg by mouth 2 (two) times daily.     Lancets MISC Use as directed to monitor FSBS 1x daily. Dx: E11.9. Please dispense as Accu-Chek Aviva 100 each 1   metoprolol  succinate (TOPROL -XL) 25 MG 24 hr tablet Take 1/2 (one-half) tablet by mouth once daily (Patient taking differently: Take 12.5 mg by mouth daily.) 45 tablet 3   Multiple Vitamin (MULTIVITAMIN WITH MINERALS) TABS  tablet Take 1 tablet by mouth daily.     naftifine  (NAFTIN ) 1 % cream Apply topically 2 (two) times daily as needed. (Patient taking differently: Apply 1 Application topically 2 (two) times daily as needed (between toes itching).) 60 g 2   olopatadine (PATADAY) 0.1 % ophthalmic solution Place 1 drop into both eyes daily as needed for allergies.     Probiotic Product (PROBIOTIC ADVANCED PO) Take 1 capsule by mouth daily.     psyllium (REGULOID) 0.52 g capsule Take 2-3 capsules by mouth in the morning and at bedtime. 3 caps am, 2 caps pm     rosuvastatin  (CRESTOR ) 5 MG tablet Take 1 tablet (5 mg total) by mouth daily. for cholesterol. 90 tablet 3   Semaglutide , 1 MG/DOSE, (OZEMPIC , 1 MG/DOSE,) 4 MG/3ML SOPN Inject 1 mg into the skin once a week. 9 mL 3   simethicone  (MYLICON) 80 MG chewable tablet Chew 80 mg by mouth as needed for flatulence.      tiZANidine  (ZANAFLEX ) 4 MG tablet TAKE 1 TABLET BY MOUTH EVERY 8 HOURS AS NEEDED FOR MUSCLE SPASM (Patient taking differently: Take 2 mg by mouth at bedtime.) 90 tablet 0   No current facility-administered medications on file prior to visit.    Allergies:   Allergies  Allergen Reactions   Adhesive [Tape] Other (See Comments)    Red and blistered   Arimidex  [Anastrozole ] Other (See Comments)    Arthralgias and myalgias.  Improved with 3 week hiatus from drug.  Categorical side effect of drug class.   Other Hives and Itching   Tetanus Toxoid-Containing Vaccines Other (See Comments)    Unknown    Tetracyclines & Related Other (See Comments)    Unknown    Lisinopril  Cough    Dry hacking cough and tickle    Physical Exam General: well developed, well nourished, seated, in no evident distress Head: head normocephalic and atraumatic.   Neck: supple with no carotid or supraclavicular bruits Cardiovascular: regular  rate and rhythm, no murmurs Musculoskeletal: no deformity Skin:  no rash/petichiae Vascular:  Normal pulses all  extremities  Neurologic Exam Mental Status: Awake and fully alert. Oriented to place and time. Recent and remote memory intact. Attention span, concentration and fund of knowledge appropriate. Mood and affect appropriate.  Cranial Nerves: Fundoscopic exam reveals sharp disc margins. Pupils equal, briskly reactive to light. Extraocular movements full without nystagmus. Visual fields full to confrontation but has subjective visual disturbance in the right eye altitudinal superior defect. Hearing intact. Facial sensation intact. Face, tongue, palate moves normally and symmetrically.  Motor: Normal bulk and tone. Normal strength in all tested extremity muscles. Sensory.: intact to touch , pinprick , position and vibratory sensation.  Coordination: Rapid alternating movements normal in all extremities. Finger-to-nose and heel-to-shin performed accurately bilaterally. Gait and Station: Arises from chair without difficulty. Stance is normal. Gait demonstrates normal stride length and balance . Able to heel, toe and tandem walk without difficulty.  Reflexes: 1+ and symmetric. Toes downgoing.   NIHSS  0 Modified Rankin  2   ASSESSMENT: 83 year old Caucasian lady with recurrent right upper field altitudinal vision disturbance of unclear etiology.  Possibilities include retinal artery branch occlusion versus retinal or vitreous pathology.  Doubt temporal arteritis in the absence of headache and normal ESR.     PLAN:I had a long discussion with the patient and her husband regarding her right eye upper visual field altitudinal vision loss which was initially transient but now appears persistent.  This likely represents either retinal artery branch occlusion or partial retinal or vitreous pathology.  Recommend urgent dermatology referral.  Her CT scan shows silent cerebellar infarct and so recommend further evaluation by interrogating her pacemaker for paroxysmal A-fib.  Check MRI scan of the brain in case her  pacemaker is MRI compatible.  Maintain aggressive risk factor modification with strict control of hypertension and blood pressure goal below 130/90, lipids with LDL cholesterol goal below 70 mg percent and diabetes with hemoglobin A1c goal below 6.5%.  Return for follow-up in the future in 6 months with nurse practitioner call earlier if necessary   I personally spent a total of 50 minutes in the care of the patient today including getting/reviewing separately obtained history, performing a medically appropriate exam/evaluation, counseling and educating, placing orders, referring and communicating with other health care professionals, documenting clinical information in the EHR, independently interpreting results, and coordinating care.        Eather Popp, MD Note: This document was prepared with digital dictation and possible smart phrase technology. Any transcriptional errors that result from this process are unintentional.

## 2024-03-01 NOTE — Patient Instructions (Addendum)
 I had a long discussion with the patient and her husband regarding her right eye upper visual field altitudinal vision loss which was initially transient but now appears persistent.  This likely represents either retinal artery branch occlusion or partial retinal or vitreous pathology.  Recommend urgent dermatology referral.  Her CT scan shows silent cerebellar infarct and so recommend further evaluation by interrogating her pacemaker for paroxysmal A-fib.  Check MRI scan of the brain in case her pacemaker is MRI compatible.  Maintain aggressive risk factor modification with strict control of hypertension and blood pressure goal below 130/90, lipids with LDL cholesterol goal below 70 mg percent and diabetes with hemoglobin A1c goal below 6.5%.  return for follow-up in the future in 6 months with nurse practitioner call earlier if necessary  Stroke Prevention Some medical conditions and behaviors can lead to a higher chance of having a stroke. You can help prevent a stroke by eating healthy, exercising, not smoking, and managing any medical conditions you have. Stroke is a leading cause of functional impairment. Primary prevention is particularly important because a majority of strokes are first-time events. Stroke changes the lives of not only those who experience a stroke but also their family and other caregivers. How can this condition affect me? A stroke is a medical emergency and should be treated right away. A stroke can lead to brain damage and can sometimes be life-threatening. If a person gets medical treatment right away, there is a better chance of surviving and recovering from a stroke. What can increase my risk? The following medical conditions may increase your risk of a stroke: Cardiovascular disease. High blood pressure (hypertension). Diabetes. High cholesterol. Sickle cell disease. Blood clotting disorders (hypercoagulable state). Obesity. Sleep disorders (obstructive sleep  apnea). Other risk factors include: Being older than age 58. Having a history of blood clots, stroke, or mini-stroke (transient ischemic attack, TIA). Genetic factors, such as race, ethnicity, or a family history of stroke. Smoking cigarettes or using other tobacco products. Taking birth control pills, especially if you also use tobacco. Heavy use of alcohol or drugs, especially cocaine and methamphetamine. Physical inactivity. What actions can I take to prevent this? Manage your health conditions High cholesterol levels. Eating a healthy diet is important for preventing high cholesterol. If cholesterol cannot be managed through diet alone, you may need to take medicines. Take any prescribed medicines to control your cholesterol as told by your health care provider. Hypertension. To reduce your risk of stroke, try to keep your blood pressure below 130/80. Eating a healthy diet and exercising regularly are important for controlling blood pressure. If these steps are not enough to manage your blood pressure, you may need to take medicines. Take any prescribed medicines to control hypertension as told by your health care provider. Ask your health care provider if you should monitor your blood pressure at home. Have your blood pressure checked every year, even if your blood pressure is normal. Blood pressure increases with age and some medical conditions. Diabetes. Eating a healthy diet and exercising regularly are important parts of managing your blood sugar (glucose). If your blood sugar cannot be managed through diet and exercise, you may need to take medicines. Take any prescribed medicines to control your diabetes as told by your health care provider. Get evaluated for obstructive sleep apnea. Talk to your health care provider about getting a sleep evaluation if you snore a lot or have excessive sleepiness. Make sure that any other medical conditions you have, such as  atrial fibrillation or  atherosclerosis, are managed. Nutrition Follow instructions from your health care provider about what to eat or drink to help manage your health condition. These instructions may include: Reducing your daily calorie intake. Limiting how much salt (sodium) you use to 1,500 milligrams (mg) each day. Using only healthy fats for cooking, such as olive oil, canola oil, or sunflower oil. Eating healthy foods. You can do this by: Choosing foods that are high in fiber, such as whole grains, and fresh fruits and vegetables. Eating at least 5 servings of fruits and vegetables a day. Try to fill one-half of your plate with fruits and vegetables at each meal. Choosing lean protein foods, such as lean cuts of meat, poultry without skin, fish, tofu, beans, and nuts. Eating low-fat dairy products. Avoiding foods that are high in sodium. This can help lower blood pressure. Avoiding foods that have saturated fat, trans fat, and cholesterol. This can help prevent high cholesterol. Avoiding processed and prepared foods. Counting your daily carbohydrate intake.  Lifestyle If you drink alcohol: Limit how much you have to: 0-1 drink a day for women who are not pregnant. 0-2 drinks a day for men. Know how much alcohol is in your drink. In the [place] one drink equals one 12 oz bottle of beer ( ), one 5 oz glass of wine ( ), or one 1 oz glass of hard liquor (44mL). Do not use any products that contain nicotine or tobacco. These products include cigarettes, chewing tobacco, and vaping devices, such as e-cigarettes. If you need help quitting, ask your health care provider. Avoid secondhand smoke. Do not use drugs. Activity  Try to stay at a healthy weight. Get at least 30 minutes of exercise on most days, such as: Fast walking. Biking. Swimming. Medicines Take over-the-counter and prescription medicines only as told by your health care provider. Aspirin or blood thinners (antiplatelets or  anticoagulants) may be recommended to reduce your risk of forming blood clots that can lead to stroke. Avoid taking birth control pills. Talk to your health care provider about the risks of taking birth control pills if: You are over 76 years old. You smoke. You get very bad headaches. You have had a blood clot. Where to find more information American Stroke Association: www.strokeassociation.org Get help right away if: You or a loved one has any symptoms of a stroke. BE FAST is an easy way to remember the main warning signs of a stroke: B - Balance. Signs are dizziness, sudden trouble walking, or loss of balance. E - Eyes. Signs are trouble seeing or a sudden change in vision. F - Face. Signs are sudden weakness or numbness of the face, or the face or eyelid drooping on one side. A - Arms. Signs are weakness or numbness in an arm. This happens suddenly and usually on one side of the body. S - Speech. Signs are sudden trouble speaking, slurred speech, or trouble understanding what people say. T - Time. Time to call emergency services. Write down what time symptoms started. You or a loved one has other signs of a stroke, such as: A sudden, severe headache with no known cause. Nausea or vomiting. Seizure. These symptoms may represent a serious problem that is an emergency. Do not wait to see if the symptoms will go away. Get medical help right away. Call your local emergency services (911 in the [place] Do not drive yourself to the hospital. Summary You can help to prevent a stroke by eating healthy, exercising, not  smoking, limiting alcohol intake, and managing any medical conditions you may have. Do not use any products that contain nicotine or tobacco. These include cigarettes, chewing tobacco, and vaping devices, such as e-cigarettes. If you need help quitting, ask your health care provider. Remember BE FAST for warning signs of a stroke. Get help right away if you or a loved one has any  of these signs. This information is not intended to replace advice given to you by your health care provider. Make sure you discuss any questions you have with your health care provider. Document Revised: 03/08/2022 Document Reviewed: 03/08/2022 Elsevier Patient Education  2024 [employer].

## 2024-03-01 NOTE — Telephone Encounter (Signed)
 Pt stated she does not want his ophthalmology referral going to Dr. Milford office

## 2024-03-02 ENCOUNTER — Encounter (HOSPITAL_COMMUNITY)

## 2024-03-02 ENCOUNTER — Encounter (HOSPITAL_COMMUNITY)
Admission: RE | Admit: 2024-03-02 | Discharge: 2024-03-02 | Disposition: A | Discharge status: Home / Self Care | Source: Ambulatory Visit | Attending: Cardiology | Admitting: Cardiology

## 2024-03-02 DIAGNOSIS — R0609 Other forms of dyspnea: Secondary | ICD-10-CM | POA: Diagnosis not present

## 2024-03-02 DIAGNOSIS — I5032 Chronic diastolic (congestive) heart failure: Secondary | ICD-10-CM | POA: Diagnosis not present

## 2024-03-02 NOTE — Progress Notes (Addendum)
 Daily Session Note  Patient Details  Name: Julie Clay MRN: [id number] Date of Birth: 01-08-1941 Referring Provider:   Flowsheet Row PULMONARY REHAB OTHER RESP ORIENTATION from 02/20/2024 in [facility]  Referring Provider Debera Savant MD    Encounter Date: 03/02/2024  Check In:  Session Check In - 03/02/24 1300       Check-In   Supervising physician immediately available to respond to emergencies See telemetry face sheet for immediately available MD    Location [facility]    Staff Present Richerd Buddle, RN;Mattisyn Cardona Vicci, RN, BSN;Brittany Jackquline, BSN, RN, WTA-C    Virtual Visit No    Medication changes reported     No    Fall or balance concerns reported    No    Warm-up and Cool-down Performed on first and last piece of equipment    Resistance Training Performed Yes    VAD Patient? No    PAD/SET Patient? No      Pain Assessment   Currently in Pain? No/denies    Pain Score 0-No pain    Multiple Pain Sites No          Capillary Blood Glucose: No results found for this or any previous visit (from the past 24 hours).    Social History   Tobacco Use  Smoking Status Never  Smokeless Tobacco Never    Goals Met:  Proper associated with RPD/PD & O2 Sat Independence with exercise equipment Using PLB without cueing & demonstrates good technique Exercise tolerated well No report of concerns or symptoms today Strength training completed today  Goals Unmet:  Not Applicable  Comments: Pt able to follow exercise prescription today without complaint.  Will continue to monitor for progression.

## 2024-03-05 ENCOUNTER — Encounter (HOSPITAL_COMMUNITY)

## 2024-03-05 ENCOUNTER — Encounter (HOSPITAL_COMMUNITY)
Admission: RE | Admit: 2024-03-05 | Discharge: 2024-03-05 | Disposition: A | Discharge status: Home / Self Care | Source: Ambulatory Visit | Attending: Cardiology | Admitting: Cardiology

## 2024-03-05 ENCOUNTER — Telehealth: Payer: Self-pay | Admitting: Neurology

## 2024-03-05 DIAGNOSIS — R0609 Other forms of dyspnea: Secondary | ICD-10-CM | POA: Diagnosis not present

## 2024-03-05 DIAGNOSIS — I5032 Chronic diastolic (congestive) heart failure: Secondary | ICD-10-CM

## 2024-03-05 LAB — GLUCOSE, CAPILLARY
Glucose-Capillary: 89 mg/dL (ref 70–99)
Glucose-Capillary: 110 mg/dL — ABNORMAL HIGH (ref 70–99)

## 2024-03-05 NOTE — Progress Notes (Signed)
 Daily Session Note  Patient Details  Name: Julie Clay MRN: [id number] Date of Birth: 1940-05-20 Referring Provider:   Flowsheet Row PULMONARY REHAB OTHER RESP ORIENTATION from 02/20/2024 in [facility] CARDIAC REHABIL[facility]  Referring Provider Debera Savant MD    Encounter Date: 03/05/2024  Check In:  Session Check In - 03/05/24 1434       Check-In   Supervising physician immediately available to respond to emergencies See telemetry face sheet for immediately available MD    Location [facility]    Staff Present Adrien Louder, RN, BSN;Casson Catena Zina, RN    Virtual Visit No    Medication changes reported     Yes    Comments Blood thinner added    Fall or balance concerns reported    No    Warm-up and Cool-down Performed on first and last piece of equipment    Resistance Training Performed Yes    VAD Patient? No    PAD/SET Patient? No      Pain Assessment   Currently in Pain? No/denies          Capillary Blood Glucose: No results found for this or any previous visit (from the past 24 hours).    Social History   Tobacco Use  Smoking Status Never  Smokeless Tobacco Never    Goals Met:  Proper associated with RPD/PD & O2 Sat Independence with exercise equipment Using PLB without cueing & demonstrates good technique Exercise tolerated well No report of concerns or symptoms today Strength training completed today  Goals Unmet:  Not Applicable  Comments: Pt able to follow exercise prescription today without complaint.  Will continue to monitor for progression.

## 2024-03-05 NOTE — Telephone Encounter (Signed)
 Julie Clay said that she can't get a MRI due to having mismatched [facility] and medtronic

## 2024-03-05 NOTE — Telephone Encounter (Signed)
Referral for ophthalmology fax to [facility]. Phone: [phone], Fax: [phone]

## 2024-03-06 DIAGNOSIS — E1140 Type 2 diabetes mellitus with diabetic neuropathy, unspecified: Secondary | ICD-10-CM | POA: Diagnosis not present

## 2024-03-06 DIAGNOSIS — E1122 Type 2 diabetes mellitus with diabetic chronic kidney disease: Secondary | ICD-10-CM | POA: Diagnosis not present

## 2024-03-06 DIAGNOSIS — M1990 Unspecified osteoarthritis, unspecified site: Secondary | ICD-10-CM | POA: Diagnosis not present

## 2024-03-06 DIAGNOSIS — J4489 Other specified chronic obstructive pulmonary disease: Secondary | ICD-10-CM | POA: Diagnosis not present

## 2024-03-06 DIAGNOSIS — I509 Heart failure, unspecified: Secondary | ICD-10-CM | POA: Diagnosis not present

## 2024-03-06 DIAGNOSIS — I130 Hypertensive heart and chronic kidney disease with heart failure and stage 1 through stage 4 chronic kidney disease, or unspecified chronic kidney disease: Secondary | ICD-10-CM | POA: Diagnosis not present

## 2024-03-06 DIAGNOSIS — I700 Atherosclerosis of aorta: Secondary | ICD-10-CM | POA: Diagnosis not present

## 2024-03-06 DIAGNOSIS — E785 Hyperlipidemia, unspecified: Secondary | ICD-10-CM | POA: Diagnosis not present

## 2024-03-07 ENCOUNTER — Encounter (HOSPITAL_COMMUNITY)
Admission: RE | Admit: 2024-03-07 | Discharge: 2024-03-07 | Disposition: A | Discharge status: Home / Self Care | Source: Ambulatory Visit | Attending: Cardiology | Admitting: Cardiology

## 2024-03-07 ENCOUNTER — Encounter (HOSPITAL_COMMUNITY)

## 2024-03-07 DIAGNOSIS — I5032 Chronic diastolic (congestive) heart failure: Secondary | ICD-10-CM

## 2024-03-07 DIAGNOSIS — R0609 Other forms of dyspnea: Secondary | ICD-10-CM

## 2024-03-07 NOTE — Progress Notes (Signed)
 Daily Session Note  Patient Details  Name: Julie Clay MRN: [id number] Date of Birth: [birth year] Referring Provider:   Flowsheet Row PULMONARY REHAB OTHER RESP ORIENTATION from 02/20/2024 in [facility] CARDIAC REHABILITATION  Referring Provider Debera Savant MD    Encounter Date: 03/07/2024  Check In:  Session Check In - 03/07/24 1414       Check-In   Supervising physician immediately available to respond to emergencies See telemetry face sheet for immediately available MD    Location [facility]    Staff Present Laymon Rattler, BSN, RN, WTA-C;Heather Con HECKLE, Exercise Physiologist;Hillary Troutman BSN, RN    Medication changes reported     No    Fall or balance concerns reported    No    Tobacco Cessation No Change    Warm-up and Cool-down Performed on first and last piece of equipment    Resistance Training Performed Yes    VAD Patient? No    PAD/SET Patient? No      Pain Assessment   Currently in Pain? No/denies          Capillary Blood Glucose: No results found for this or any previous visit (from the past 24 hours).    Social History   Tobacco Use  Smoking Status Never  Smokeless Tobacco Never    Goals Met:  Proper associated with RPD/PD & O2 Sat Independence with exercise equipment Improved SOB with ADL's Using PLB without cueing & demonstrates good technique Exercise tolerated well No report of concerns or symptoms today Strength training completed today  Goals Unmet:  Not Applicable  Comments: Pt able to follow exercise prescription today without complaint.  Will continue to monitor for progression.

## 2024-03-09 ENCOUNTER — Encounter (HOSPITAL_COMMUNITY)

## 2024-03-09 ENCOUNTER — Encounter (HOSPITAL_COMMUNITY)
Admission: RE | Admit: 2024-03-09 | Discharge: 2024-03-09 | Disposition: A | Discharge status: Home / Self Care | Source: Ambulatory Visit | Attending: Cardiology | Admitting: Cardiology

## 2024-03-09 DIAGNOSIS — I5032 Chronic diastolic (congestive) heart failure: Secondary | ICD-10-CM

## 2024-03-09 DIAGNOSIS — R0609 Other forms of dyspnea: Secondary | ICD-10-CM | POA: Diagnosis not present

## 2024-03-09 NOTE — Progress Notes (Signed)
 Daily Session Note  Patient Details  Name: Julie Clay MRN: [id number] Date of Birth: 19-Apr-1941 Referring Provider:   Flowsheet Row PULMONARY REHAB OTHER RESP ORIENTATION from 02/20/2024 in [facility] CARDIAC REHABILITATION  Referring Provider Debera Savant MD    Encounter Date: 03/09/2024  Check In:  Session Check In - 03/09/24 1315       Check-In   Supervising physician immediately available to respond to emergencies See telemetry face sheet for immediately available MD    Location [facility]    Staff Present Laymon Rattler, BSN, RN, WTA-C;Victoria Zina, RN    Virtual Visit No    Medication changes reported     No    Fall or balance concerns reported    No    Tobacco Cessation No Change    Warm-up and Cool-down Performed on first and last piece of equipment    Resistance Training Performed Yes    VAD Patient? No    PAD/SET Patient? No      Pain Assessment   Currently in Pain? No/denies          Capillary Blood Glucose: No results found for this or any previous visit (from the past 24 hours).    Social History   Tobacco Use  Smoking Status Never  Smokeless Tobacco Never    Goals Met:  Proper associated with RPD/PD & O2 Sat Independence with exercise equipment Improved SOB with ADL's Using PLB without cueing & demonstrates good technique Exercise tolerated well No report of concerns or symptoms today Strength training completed today  Goals Unmet:  Not Applicable  Comments: Pt able to follow exercise prescription today without complaint.  Will continue to monitor for progression.

## 2024-03-12 ENCOUNTER — Encounter (HOSPITAL_COMMUNITY)
Admission: RE | Admit: 2024-03-12 | Discharge: 2024-03-12 | Disposition: A | Discharge status: Home / Self Care | Source: Ambulatory Visit | Attending: Cardiology | Admitting: Cardiology

## 2024-03-12 ENCOUNTER — Encounter (HOSPITAL_COMMUNITY)

## 2024-03-12 DIAGNOSIS — R0609 Other forms of dyspnea: Secondary | ICD-10-CM

## 2024-03-12 DIAGNOSIS — I5032 Chronic diastolic (congestive) heart failure: Secondary | ICD-10-CM | POA: Diagnosis not present

## 2024-03-12 NOTE — Progress Notes (Signed)
 Daily Session Note  Patient Details  Name: CARLISLE TORGESON MRN: [id number] Date of Birth: 01-26-1941 Referring Provider:   Flowsheet Row PULMONARY REHAB OTHER RESP ORIENTATION from 02/20/2024 in [facility] CARDIAC REHABILITATION  Referring Provider Debera Savant MD    Encounter Date: 03/12/2024  Check In:  Session Check In - 03/12/24 1430       Check-In   Supervising physician immediately available to respond to emergencies See telemetry face sheet for immediately available MD    Location [facility]    Staff Present Harlene Gelineau, MA, RCEP, CCRP, CCET;Heather Con, [place], Exercise Physiologist    Virtual Visit No    Medication changes reported     No    Fall or balance concerns reported    No    Warm-up and Cool-down Performed on first and last piece of equipment    Resistance Training Performed Yes    VAD Patient? No    PAD/SET Patient? No      Pain Assessment   Currently in Pain? No/denies          Capillary Blood Glucose: No results found for this or any previous visit (from the past 24 hours).    Social History   Tobacco Use  Smoking Status Never  Smokeless Tobacco Never    Goals Met:  Proper associated with RPD/PD & O2 Sat Independence with exercise equipment Using PLB without cueing & demonstrates good technique Exercise tolerated well No report of concerns or symptoms today Strength training completed today  Goals Unmet:  Not Applicable  Comments: Pt able to follow exercise prescription today without complaint.  Will continue to monitor for progression.

## 2024-03-12 NOTE — Telephone Encounter (Signed)
 Order cancelled

## 2024-03-14 ENCOUNTER — Encounter (HOSPITAL_COMMUNITY)
Admission: RE | Admit: 2024-03-14 | Discharge: 2024-03-14 | Disposition: A | Discharge status: Home / Self Care | Source: Ambulatory Visit | Attending: Cardiology | Admitting: Cardiology

## 2024-03-14 ENCOUNTER — Encounter (HOSPITAL_COMMUNITY)

## 2024-03-14 DIAGNOSIS — I5032 Chronic diastolic (congestive) heart failure: Secondary | ICD-10-CM

## 2024-03-14 DIAGNOSIS — R0609 Other forms of dyspnea: Secondary | ICD-10-CM

## 2024-03-14 NOTE — Progress Notes (Signed)
 Daily Session Note  Patient Details  Name: Julie Clay MRN: [id number] Date of Birth: 08/23/1940 Referring Provider:   Flowsheet Row PULMONARY REHAB OTHER RESP ORIENTATION from 02/20/2024 in [facility] CARDIAC REHABILITATION  Referring Provider Debera Savant MD    Encounter Date: 03/14/2024  Check In:  Session Check In - 03/14/24 1312       Check-In   Supervising physician immediately available to respond to emergencies See telemetry face sheet for immediately available MD    Location [facility]    Staff Present Powell Benders, BS, Exercise Physiologist;Kawon Willcutt Dean BSN, RN;Victoria Zina, RN    Virtual Visit No    Medication changes reported     No    Fall or balance concerns reported    No    Tobacco Cessation No Change    Warm-up and Cool-down Performed on first and last piece of equipment    Resistance Training Performed Yes    VAD Patient? No    PAD/SET Patient? No      Pain Assessment   Currently in Pain? Yes    Pain Score 5     Pain Location Hand    Pain Orientation Left    Pain Descriptors / Indicators Sore   swollen area on top of left hand-pt was cracking pecans   Pain Type Acute pain    Pain Onset Yesterday    Pain Relieving Factors Pt has taken Tylenol     Multiple Pain Sites No          Capillary Blood Glucose: No results found for this or any previous visit (from the past 24 hours).    Social History   Tobacco Use  Smoking Status Never  Smokeless Tobacco Never    Goals Met:  Proper associated with RPD/PD & O2 Sat Independence with exercise equipment Using PLB without cueing & demonstrates good technique Exercise tolerated well Queuing for purse lip breathing No report of concerns or symptoms today Strength training completed today  Goals Unmet:  Not Applicable  Comments: .Pt able to follow exercise prescription today without complaint.  Will continue to monitor for progression.

## 2024-03-19 ENCOUNTER — Encounter (HOSPITAL_COMMUNITY)

## 2024-03-20 ENCOUNTER — Encounter (HOSPITAL_COMMUNITY): Payer: Self-pay | Admitting: *Deleted

## 2024-03-20 DIAGNOSIS — R0609 Other forms of dyspnea: Secondary | ICD-10-CM

## 2024-03-20 DIAGNOSIS — I5032 Chronic diastolic (congestive) heart failure: Secondary | ICD-10-CM

## 2024-03-20 NOTE — Progress Notes (Signed)
 Pulmonary Individual Treatment Plan  Patient Details  Name: Julie Clay MRN: [id number] Date of Birth: 01-16-1941 Referring Provider:   Flowsheet Row PULMONARY REHAB OTHER RESP ORIENTATION from 02/20/2024 in [facility] CARDIAC REHABILITATION  Referring Provider Debera Savant MD    Initial Encounter Date:  Flowsheet Row PULMONARY REHAB OTHER RESP ORIENTATION from 02/20/2024 in [facility] CARDIAC REHABILITATION  Date 02/20/24    Visit Diagnosis: DOE (dyspnea on exertion)  Chronic diastolic heart failure (HCC)  Patient's Home Medications on Admission:   Current Outpatient Medications:    acetaminophen  (TYLENOL ) 500 MG tablet, Take 1,000 mg by mouth every 6 (six) hours as needed for moderate pain., Disp: , Rfl:    albuterol  (VENTOLIN  HFA) 108 (90 Base) MCG/ACT inhaler, Inhale 2 puffs into the lungs every 6 (six) hours as needed for wheezing or shortness of breath., Disp: 8 g, Rfl: 0   Aloe Vera Leaf  POWD, Take 800 mg by mouth 2 (two) times daily., Disp: , Rfl:    Ascorbic Acid  (VITAMIN C ) 1000 MG tablet, Take 1,000 mg by mouth 2 (two) times daily., Disp: , Rfl:    aspirin  EC 81 MG tablet, Take 1 tablet (81 mg total) by mouth daily for 21 days. Swallow whole., Disp: 21 tablet, Rfl: 0   b complex vitamins tablet, Take 1 tablet by mouth 2 (two) times daily. , Disp: , Rfl:    Blood Glucose Monitoring Suppl (BLOOD GLUCOSE SYSTEM PAK) KIT, Use as directed to monitor FSBS 1x daily. Dx: E11.9. Please dispense as Accu-Chek Aviva, Disp: 1 each, Rfl: 1   Calcium  Polycarbophil (FIBER-CAPS PO), Take 2-3 capsules by mouth in the morning and at bedtime., Disp: , Rfl:    Cholecalciferol  (VITAMIN D3) 2000 UNITS TABS, Take 2,000 Units by mouth 2 (two) times daily., Disp: , Rfl:    clopidogrel  (PLAVIX ) 75 MG tablet, Take 1 tablet (75 mg total) by mouth daily., Disp: 30 tablet, Rfl: 11   clotrimazole -betamethasone  (LOTRISONE ) cream, Apply 1 Application topically daily. (Patient taking differently: Apply 1  Application topically daily as needed (groin rash).), Disp: 30 g, Rfl: 0   dapagliflozin  propanediol (FARXIGA ) 10 MG TABS tablet, Take 1 tablet (10 mg total) by mouth daily before breakfast., Disp: 90 tablet, Rfl: 3   fluocinonide  cream (LIDEX ) 0.05 %, Apply 1 Application topically 2 (two) times daily as needed (poison ivy/bites/hives)., Disp: 30 g, Rfl: 11   Ginkgo Biloba 120 MG CAPS, Take 120 mg by mouth in the morning and at bedtime. , Disp: , Rfl:    Glucose Blood (BLOOD GLUCOSE TEST STRIPS) STRP, Use as directed to monitor FSBS 1x daily. Dx: E11.9. Please dispense as Accu-Chek Aviva, Disp: 100 each, Rfl: 1   KRILL OIL PO, Take 1,500 mg by mouth 2 (two) times daily., Disp: , Rfl:    Lancets MISC, Use as directed to monitor FSBS 1x daily. Dx: E11.9. Please dispense as Accu-Chek Aviva, Disp: 100 each, Rfl: 1   metoprolol  succinate (TOPROL -XL) 25 MG 24 hr tablet, Take 1/2 (one-half) tablet by mouth once daily (Patient taking differently: Take 12.5 mg by mouth daily.), Disp: 45 tablet, Rfl: 3   Multiple Vitamin (MULTIVITAMIN WITH MINERALS) TABS tablet, Take 1 tablet by mouth daily., Disp: , Rfl:    naftifine  (NAFTIN ) 1 % cream, Apply topically 2 (two) times daily as needed. (Patient taking differently: Apply 1 Application topically 2 (two) times daily as needed (between toes itching).), Disp: 60 g, Rfl: 2   olopatadine (PATADAY) 0.1 % ophthalmic solution, Place 1  drop into both eyes daily as needed for allergies., Disp: , Rfl:    Probiotic Product (PROBIOTIC ADVANCED PO), Take 1 capsule by mouth daily., Disp: , Rfl:    psyllium (REGULOID) 0.52 g capsule, Take 2-3 capsules by mouth in the morning and at bedtime. 3 caps am, 2 caps pm, Disp: , Rfl:    rosuvastatin  (CRESTOR ) 5 MG tablet, Take 1 tablet (5 mg total) by mouth daily. for cholesterol., Disp: 90 tablet, Rfl: 3   Semaglutide , 1 MG/DOSE, (OZEMPIC , 1 MG/DOSE,) 4 MG/3ML SOPN, Inject 1 mg into the skin once a week., Disp: 9 mL, Rfl: 3   simethicone   (MYLICON) 80 MG chewable tablet, Chew 80 mg by mouth as needed for flatulence. 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    Osteoarthritis    both knees, lower back, both shoulders; bone spurs to feet   Rectocele 06/25/2013   Posterior repair 11/06/13    Type II diabetes mellitus (HCC)    Ventral hernia     Tobacco Use: Social History   Tobacco Use  Smoking Status Never  Smokeless Tobacco Never    Labs: Review Flowsheet  More data exists      Latest Ref Rng & Units 12/23/2021 06/18/2022 12/24/2022 06/28/2023 12/26/2023  Labs for ITP Cardiac and Pulmonary Rehab  Cholestrol 100 - 199 mg/dL - 829  - 855  851   LDL (calc) 0 - 99 mg/dL - 78  - 62  71   HDL-C >39 mg/dL - 47  - 44  48   Trlycerides 0 - 149 mg/dL - 719  - 762  829   Hemoglobin A1c 4.8 - 5.6 % 6.5  6.5  6.5  6.4  6.0  5.9     Details       Multiple values from one day are sorted in reverse-chronological order         Capillary Blood Glucose: Lab Results  Component Value Date   GLUCAP 89 03/05/2024   GLUCAP 110 (H) 03/05/2024   GLUCAP 87 02/24/2024   GLUCAP 92  02/24/2024   GLUCAP 83 02/22/2024     Pulmonary Assessment Scores:  Pulmonary Assessment Scores     Row Name 02/20/24 1409         ADL UCSD   ADL Phase Entry     SOB Score total 77     Rest 0     Walk 2  Stairs 5     Bath 2     Dress 3     Shop 5       CAT Score   CAT Score 17       mMRC Score   mMRC Score 3       UCSD: Self-administered rating of dyspnea associated with activities of daily living (ADLs) 6-point scale (0 = not at all to 5 = maximal or unable to do because of breathlessness)  Scoring Scores range from 0 to 120.  Minimally important difference is 5 units  CAT: CAT can identify the health impairment of COPD patients and is better correlated with disease progression.  CAT has a scoring range of zero to 40. The CAT score is classified into four groups of low (less than 10), medium (10 - 20), high (21-30) and very high (31-40) based on the impact level of disease on health status. A CAT score over 10 suggests significant symptoms.  A worsening CAT score could be explained by an exacerbation, poor medication adherence, poor inhaler technique, or progression of COPD or comorbid conditions.  CAT MCID is 2 points  mMRC: mMRC (Modified Medical Research Council) Dyspnea Scale is used to assess the degree of baseline functional disability in patients of respiratory disease due to dyspnea. No minimal important difference is established. A decrease in score of 1 point or greater is considered a positive change.   Pulmonary Function Assessment:   Exercise Target Goals: Exercise Program Goal: Individual exercise prescription set using results from initial 6 min walk test and THRR while considering  patient's activity barriers and safety.   Exercise Prescription Goal: Initial exercise prescription builds to 30-45 minutes a day of aerobic activity, 2-3 days per week.  Home exercise guidelines will be given to patient during program as part of exercise  prescription that the participant will acknowledge.  Activity Barriers & Risk Stratification:  Activity Barriers & Cardiac Risk Stratification - 02/20/24 1315       Activity Barriers & Cardiac Risk Stratification   Activity Barriers Arthritis;Back Problems;Shortness of Breath;Deconditioning;Other (comment)    Comments spurs in both heels and knees          6 Minute Walk:  6 Minute Walk     Row Name 02/20/24 1412         6 Minute Walk   Phase Initial     Distance 750 feet     Walk Time 6 minutes     # of Rest Breaks 1     MPH 1.42     METS 1.04     RPE 13     Perceived Dyspnea  2     VO2 Peak 3.67     Symptoms Yes (comment)     Comments SOB and BLE pain L knee     Resting HR 70 bpm     Resting BP 120/64     Resting Oxygen Saturation  96 %     Exercise Oxygen Saturation  during 6 min walk 91 %     Max Ex. HR 117 bpm     Max Ex. BP 136/64     2 Minute Post BP 122/62       Interval HR   1 Minute HR 102     2 Minute HR 117     3 Minute HR 110     4 Minute HR 106     5 Minute HR 77     6 Minute HR  110     2 Minute Post HR 78     Interval Heart Rate? Yes       Interval Oxygen   Interval Oxygen? Yes     Baseline Oxygen Saturation % 96 %     1 Minute Oxygen Saturation % 91 %     1 Minute Liters of Oxygen 0 L     2 Minute Oxygen Saturation % 94 %     2 Minute Liters of Oxygen 0 L     3 Minute Oxygen Saturation % 95 %     3 Minute Liters of Oxygen 0 L     4 Minute Oxygen Saturation % 96 %     4 Minute Liters of Oxygen 0 L     5 Minute Oxygen Saturation % 95 %     5 Minute Liters of Oxygen 0 L     6 Minute Oxygen Saturation % 95 %     6 Minute Liters of Oxygen 0 L     2 Minute Post Oxygen Saturation % 96 %     2 Minute Post Liters of Oxygen 0 L        Oxygen Initial Assessment:  Oxygen Initial Assessment - 02/20/24 1419       Home Oxygen   Home Oxygen Device None    Sleep Oxygen Prescription None    Home Exercise Oxygen Prescription None    Home  Resting Oxygen Prescription None      Initial 6 min Walk   Oxygen Used None      Program Oxygen Prescription   Program Oxygen Prescription None      Intervention   Short Term Goals To learn and understand importance of monitoring SPO2 with pulse oximeter and demonstrate accurate use of the pulse oximeter.;To learn and understand importance of maintaining oxygen saturations>88%;To learn and demonstrate proper pursed lip breathing techniques or other breathing techniques. ;To learn and demonstrate proper use of respiratory medications    Long  Term Goals Verbalizes importance of monitoring SPO2 with pulse oximeter and return demonstration;Maintenance of O2 saturations>88%;Exhibits proper breathing techniques, such as pursed lip breathing or other method taught during program session;Compliance with respiratory medication;Demonstrates proper use of MDI's          Oxygen Re-Evaluation:  Oxygen Re-Evaluation     Row Name 03/02/24 1352             Program Oxygen Prescription   Program Oxygen Prescription None         Home Oxygen   Home Oxygen Device None       Sleep Oxygen Prescription None       Home Exercise Oxygen Prescription None       Home Resting Oxygen Prescription None         Goals/Expected Outcomes   Short Term Goals To learn and understand importance of monitoring SPO2 with pulse oximeter and demonstrate accurate use of the pulse oximeter.;To learn and understand importance of maintaining oxygen saturations>88%;To learn and demonstrate proper pursed lip breathing techniques or other breathing techniques. ;To learn and demonstrate proper use of respiratory medications       Long  Term Goals Verbalizes importance of monitoring SPO2 with pulse oximeter and return demonstration;Maintenance of O2 saturations>88%;Exhibits proper breathing techniques, such as pursed lip breathing or other method taught during program session;Compliance with respiratory medication;Demonstrates  proper use of MDI's       Comments Whitlee checks her o2 sat and home regularly. Kadia does  not require any home oxygen. She does mention with a lot of weight bearing activity she gets very SOB.       Goals/Expected Outcomes Short: become more proficient in PLB techniques. Long: be able to do more weight bearing activites without being extremely SOB.          Oxygen Discharge (Final Oxygen Re-Evaluation):  Oxygen Re-Evaluation - 03/02/24 1352       Program Oxygen Prescription   Program Oxygen Prescription None      Home Oxygen   Home Oxygen Device None    Sleep Oxygen Prescription None    Home Exercise Oxygen Prescription None    Home Resting Oxygen Prescription None      Goals/Expected Outcomes   Short Term Goals To learn and understand importance of monitoring SPO2 with pulse oximeter and demonstrate accurate use of the pulse oximeter.;To learn and understand importance of maintaining oxygen saturations>88%;To learn and demonstrate proper pursed lip breathing techniques or other breathing techniques. ;To learn and demonstrate proper use of respiratory medications    Long  Term Goals Verbalizes importance of monitoring SPO2 with pulse oximeter and return demonstration;Maintenance of O2 saturations>88%;Exhibits proper breathing techniques, such as pursed lip breathing or other method taught during program session;Compliance with respiratory medication;Demonstrates proper use of MDI's    Comments Durene checks her o2 sat and home regularly. Arielis does not require any home oxygen. She does mention with a lot of weight bearing activity she gets very SOB.    Goals/Expected Outcomes Short: become more proficient in PLB techniques. Long: be able to do more weight bearing activites without being extremely SOB.          Initial Exercise Prescription:  Initial Exercise Prescription - 02/20/24 1400       Date of Initial Exercise RX and Referring Provider   Date 02/20/24    Referring Provider  Debera Savant MD      NuStep   Level 1    SPM 50    Minutes 15    METs 1.9      Arm Ergometer   Level 1    RPM 50    Minutes 15    METs 1.6      Prescription Details   Frequency (times per week) 3    Duration Progress to 30 minutes of continuous aerobic without signs/symptoms of physical distress      Intensity   THRR 40-80% of Max Heartrate 98-125    Ratings of Perceived Exertion 11-13    Perceived Dyspnea 0-4      Resistance Training   Training Prescription Yes    Weight 3    Reps 10-15          Perform Capillary Blood Glucose checks as needed.  Exercise Prescription Changes:   Exercise Prescription Changes     Row Name 02/20/24 1400 03/12/24 1500           Response to Exercise   Blood Pressure (Admit) 120/64 130/64      Blood Pressure (Exercise) 136/64 134/72      Blood Pressure (Exit) 122/62 118/70      Heart Rate (Admit) 70 bpm 73 bpm      Heart Rate (Exercise) 117 bpm 88 bpm      Heart Rate (Exit) 78 bpm 70 bpm      Oxygen Saturation (Admit) 96 % 95 %      Oxygen Saturation (Exercise) 91 % 95 %      Oxygen Saturation (Exit) 95 %  95 %      Rating of Perceived Exertion (Exercise) 13 13      Perceived Dyspnea (Exercise) 2 1      Duration -- Continue with 30 min of aerobic exercise without signs/symptoms of physical distress.      Intensity -- THRR unchanged        Progression   Progression -- Continue to progress workloads to maintain intensity without signs/symptoms of physical distress.        Resistance Training   Training Prescription -- Yes      Weight -- 2      Reps -- 10-15        NuStep   Level -- 1      SPM -- 87      Minutes -- 15      METs -- 2        Arm Ergometer   Level -- 1      RPM -- 59      Minutes -- 15      METs -- 2         Exercise Comments:   Exercise Comments     Row Name 02/22/24 1413           Exercise Comments First full day of exercise!  Patient was oriented to gym and equipment including  functions, settings, policies, and procedures.  Patient's individual exercise prescription and treatment plan were reviewed.  All starting workloads were established based on the results of the 6 minute walk test done at initial orientation visit.  The plan for exercise progression was also introduced and progression will be customized based on patient's performance and goals.          Exercise Goals and Review:   Exercise Goals     Row Name 02/20/24 1416             Exercise Goals   Increase Physical Activity Yes       Intervention Provide advice, education, support and counseling about physical activity/exercise needs.;Develop an individualized exercise prescription for aerobic and resistive training based on initial evaluation findings, risk stratification, comorbidities and participant's personal goals.       Expected Outcomes Short Term: Attend rehab on a regular basis to increase amount of physical activity.;Long Term: Exercising regularly at least 3-5 days a week.;Long Term: Add in home exercise to make exercise part of routine and to increase amount of physical activity.       Increase Strength and Stamina Yes       Intervention Provide advice, education, support and counseling about physical activity/exercise needs.;Develop an individualized exercise prescription for aerobic and resistive training based on initial evaluation findings, risk stratification, comorbidities and participant's personal goals.       Expected Outcomes Short Term: Increase workloads from initial exercise prescription for resistance, speed, and METs.;Long Term: Improve cardiorespiratory fitness, muscular endurance and strength as measured by increased METs and functional capacity ( );Short Term: Perform resistance training exercises routinely during rehab and add in resistance training at home       Able to understand and use rate of perceived exertion (RPE) scale Yes       Intervention Provide education and  explanation on how to use RPE scale       Expected Outcomes Short Term: Able to use RPE daily in rehab to express subjective intensity level;Long Term:  Able to use RPE to guide intensity level when exercising independently       Able to  understand and use Dyspnea scale Yes       Intervention Provide education and explanation on how to use Dyspnea scale       Expected Outcomes Short Term: Able to use Dyspnea scale daily in rehab to express subjective sense of shortness of breath during exertion;Long Term: Able to use Dyspnea scale to guide intensity level when exercising independently       Knowledge and understanding of Target Heart Rate Range (THRR) Yes       Intervention Provide education and explanation of THRR including how the numbers were predicted and where they are located for reference       Expected Outcomes Short Term: Able to state/look up THRR;Long Term: Able to use THRR to govern intensity when exercising independently;Short Term: Able to use daily as guideline for intensity in rehab       Able to check pulse independently Yes       Intervention Provide education and demonstration on how to check pulse in carotid and radial arteries.;Review the importance of being able to check your own pulse for safety during independent exercise       Expected Outcomes Long Term: Able to check pulse independently and accurately;Short Term: Able to explain why pulse checking is important during independent exercise       Understanding of Exercise Prescription Yes       Intervention Provide education, explanation, and written materials on patient's individual exercise prescription       Expected Outcomes Short Term: Able to explain program exercise prescription;Long Term: Able to explain home exercise prescription to exercise independently          Exercise Goals Re-Evaluation :  Exercise Goals Re-Evaluation     Row Name 02/22/24 1414 03/02/24 1348           Exercise Goal Re-Evaluation    Exercise Goals Review Able to understand and use rate of perceived exertion (RPE) scale;Understanding of Exercise Prescription;Able to understand and use Dyspnea scale Increase Physical Activity;Increase Strength and Stamina;Able to understand and use rate of perceived exertion (RPE) scale;Able to understand and use Dyspnea scale;Knowledge and understanding of Target Heart Rate Range (THRR);Able to check pulse independently;Understanding of Exercise Prescription      Comments Reviewed RPE and dyspnea scale, THR and program prescription with pt today.  Pt voiced understanding and was given a copy of goals to take home.  Reviewed PLB technique with pt.  Talked about how it works and it's importance in maintaining their exercise saturations. Kayde notes she has not been exercising at home, she notes she just gets so SOB with much activity. She doesn't like going to a gym because no one monitors her. She will do laundry or dishes, but she will do them sitting down she says. I explained to her we will work on her breathing while in rehab.      Expected Outcomes Short: Use RPE daily to regulate intensity.  Long: Follow program prescription in THR.      Short: Become more profiecient at using PLB.   Long: Become independent at using PLB. Short: continue to attend rehab. Long: Increase stamina and become more profiecient at using PLB.         Discharge Exercise Prescription (Final Exercise Prescription Changes):  Exercise Prescription Changes - 03/12/24 1500       Response to Exercise   Blood Pressure (Admit) 130/64    Blood Pressure (Exercise) 134/72    Blood Pressure (Exit) 118/70    Heart  Rate (Admit) 73 bpm    Heart Rate (Exercise) 88 bpm    Heart Rate (Exit) 70 bpm    Oxygen Saturation (Admit) 95 %    Oxygen Saturation (Exercise) 95 %    Oxygen Saturation (Exit) 95 %    Rating of Perceived Exertion (Exercise) 13    Perceived Dyspnea (Exercise) 1    Duration Continue with 30 min of aerobic  exercise without signs/symptoms of physical distress.    Intensity THRR unchanged      Progression   Progression Continue to progress workloads to maintain intensity without signs/symptoms of physical distress.      Resistance Training   Training Prescription Yes    Weight 2    Reps 10-15      NuStep   Level 1    SPM 87    Minutes 15    METs 2      Arm Ergometer   Level 1    RPM 59    Minutes 15    METs 2          Nutrition:  Target Goals: Understanding of nutrition guidelines, daily intake of sodium 1500mg , cholesterol 200mg , calories 30% from fat and 7% or less from saturated fats, daily to have 5 or more servings of fruits and vegetables.  Biometrics:  Pre Biometrics - 02/20/24 1417       Pre Biometrics   Height 5' 5 (1.651 m)    Weight 216 lb 11.4 oz (98.3 kg)    Waist Circumference 46 inches    Hip Circumference 53.5 inches    Waist to Hip Ratio 0.86 %    BMI (Calculated) 36.06    Grip Strength 20.2 kg    Single Leg Stand 9.54 seconds           Nutrition Therapy Plan and Nutrition Goals:   Nutrition Assessments:  MEDIFICTS Score Key: >=70 Need to make dietary changes  40-70 Heart Healthy Diet <= 40 Therapeutic Level Cholesterol Diet  Flowsheet Row PULMONARY REHAB OTHER RESP ORIENTATION from 02/20/2024 in [facility] CARDIAC REHABILITATION  Picture Your Plate Total Score on Admission 54   Picture Your Plate Scores: <59 Unhealthy dietary pattern with much room for improvement. 41-50 Dietary pattern unlikely to meet recommendations for good health and room for improvement. 51-60 More healthful dietary pattern, with some room for improvement.  >60 Healthy dietary pattern, although there may be some specific behaviors that could be improved.    Nutrition Goals Re-Evaluation:  Nutrition Goals Re-Evaluation     Row Name 03/02/24 1337             Goals   Nutrition Goal Healthy eating       Comment Tametha notes she eats very well, 3 meals a  day. Ayane is diabetic so she tries to watch her carbs. Last A1C was 5.9 and she was taken of glipizide  and continues fargixa and checks her CBG at home. Spoke with her about making sure she drinks plenty of water and gets plenty of protein in her diet.       Expected Outcome Short: continue to watch carb intake and increase protein. Long: continue to eat healthy, well balanced meals.          Nutrition Goals Discharge (Final Nutrition Goals Re-Evaluation):  Nutrition Goals Re-Evaluation - 03/02/24 1337       Goals   Nutrition Goal Healthy eating    Comment Jesly notes she eats very well, 3 meals a day. Kambri is  diabetic so she tries to watch her carbs. Last A1C was 5.9 and she was taken of glipizide  and continues fargixa and checks her CBG at home. Spoke with her about making sure she drinks plenty of water and gets plenty of protein in her diet.    Expected Outcome Short: continue to watch carb intake and increase protein. Long: continue to eat healthy, well balanced meals.          Psychosocial: Target Goals: Acknowledge presence or absence of significant depression and/or stress, maximize coping skills, provide positive support system. Participant is able to verbalize types and ability to use techniques and skills needed for reducing stress and depression.  Initial Review & Psychosocial Screening:  Initial Psych Review & Screening - 02/20/24 1438       Initial Review   Current issues with Current Sleep Concerns      Family Dynamics   Good Support System? Yes    Comments Patient's husband is her main support.      Barriers   Psychosocial barriers to participate in program The patient should benefit from training in stress management and relaxation.      Screening Interventions   Interventions Encouraged to exercise;To provide support and resources with identified psychosocial needs;Provide feedback about the scores to participant    Expected Outcomes Short Term goal:  Identification and review with participant of any Quality of Life or Depression concerns found by scoring the questionnaire.;Long Term goal: The participant improves quality of Life and PHQ9 Scores as seen by post scores and/or verbalization of changes;Long Term Goal: Stressors or current issues are controlled or eliminated.;Short Term goal: Utilizing psychosocial counselor, staff and physician to assist with identification of specific Stressors or current issues interfering with healing process. Setting desired goal for each stressor or current issue identified.          Quality of Life Scores:  Scores of 19 and below usually indicate a poorer quality of life in these areas.  A difference of  2-3 points is a clinically meaningful difference.  A difference of 2-3 points in the total score of the Quality of Life Index has been associated with significant improvement in overall quality of life, self-image, physical symptoms, and general health in studies assessing change in quality of life.   PHQ-9: Review Flowsheet  More data exists      02/20/2024 01/03/2024 06/28/2023 05/10/2023 12/29/2022  Depression screen PHQ 2/9  Decreased Interest 0 0 0 0 0  Down, Depressed, Hopeless 0 0 0 0 0  PHQ - 2 Score 0 0 0 0 0  Altered sleeping 2 0 0 1 -  Tired, decreased energy 3 0 0 2 -  Change in appetite 0 0 0 0 -  Feeling bad or failure about yourself  0 0 0 0 -  Trouble concentrating 0 0 0 0 -  Moving slowly or fidgety/restless 0 0 0 0 -  Suicidal thoughts 0 0 0 0 -  PHQ-9 Score 5  0  0  3  -  Difficult doing work/chores Somewhat difficult Not difficult at all Not difficult at all Not difficult at all -    Details       Data saved with a previous flowsheet row definition        Interpretation of Total Score  Total Score Depression Severity:  1-4 = Minimal depression, 5-9 = Mild depression, 10-14 = Moderate depression, 15-19 = Moderately severe depression, 20-27 = Severe depression    Psychosocial Evaluation  and Intervention:  Psychosocial Evaluation - 02/20/24 1438       Psychosocial Evaluation & Interventions   Interventions Stress management education;Relaxation education;Encouraged to exercise with the program and follow exercise prescription    Comments Patient was referred to pulmonary rehab with DOE and diastolic HF. Her PHQ-9 score was 5 due to trouble staying asleep and energy.  She denies any depression or anxiety. She has been through PR in 2019 and joined maintenance but did not return after COVID. 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 She is already familiar with the program and is looking forward to getting active again. She lives with her husband who is her support person. Her goal for the program is to be able to do more with less SOB.    Expected Outcomes Short Term: Patient will start the program and attend consistently. Long Term: Patient will complete the program meeting personal goals.    Continue Psychosocial Services  Follow up required by staff          Psychosocial Re-Evaluation:  Psychosocial Re-Evaluation     Row Name 03/02/24 1330             Psychosocial Re-Evaluation   Current issues with Current Sleep Concerns;Current Stress Concerns       Comments Adryanna has had some recent stress concerns, earlier this week she went to the ED for a possible stroke. Turns out she is having issues with her right eye, a purple/gray blur in her upper eye area, neurologist mentions it may be a blood clot in her eye. Kamaljit cannot have a MRI due to her pacemaker. She will follow up with an optometrist. She mentions she sleeps ok some night or better than others.       Expected Outcomes Short: Follow up with optometrist. Long: find healthy stress outlets.       Interventions Encouraged to attend Pulmonary Rehabilitation for the exercise       Continue Psychosocial Services  Follow up required by staff          Psychosocial Discharge  (Final Psychosocial Re-Evaluation):  Psychosocial Re-Evaluation - 03/02/24 1330       Psychosocial Re-Evaluation   Current issues with Current Sleep Concerns;Current Stress Concerns    Comments Jency has had some recent stress concerns, earlier this week she went to the ED for a possible stroke. Turns out she is having issues with her right eye, a purple/gray blur in her upper eye area, neurologist mentions it may be a blood clot in her eye. Carmine cannot have a MRI due to her pacemaker. She will follow up with an optometrist. She mentions she sleeps ok some night or better than others.    Expected Outcomes Short: Follow up with optometrist. Long: find healthy stress outlets.    Interventions Encouraged to attend Pulmonary Rehabilitation for the exercise    Continue Psychosocial Services  Follow up required by staff           Education: Education Goals: Education classes will be provided on a weekly basis, covering required topics. Participant will state understanding/return demonstration of topics presented.  Learning Barriers/Preferences:  Learning Barriers/Preferences - 02/20/24 1326       Learning Barriers/Preferences   Learning Barriers None    Learning Preferences Audio;Computer/Internet;Skilled Demonstration;Verbal Instruction;Video;Written Material          Education Topics: How Lungs Work and Diseases: - Discuss the anatomy of the lungs and diseases that can affect the lungs, such as COPD. Flowsheet  Row PULMONARY REHAB OTHER RESPIRATORY from 11/09/2018 in [place] [facility]  Date 05/25/18  Educator Vicci  Instruction Review Code 2- Demonstrated Understanding    Exercise: -Discuss the importance of exercise, FITT principles of exercise, normal and abnormal responses to exercise, and how to exercise safely.   Environmental Irritants: -Discuss types of environmental irritants and how to limit exposure to environmental irritants. Flowsheet Row  PULMONARY REHAB OTHER RESPIRATORY from 11/09/2018 in [place] [facility]  Date 06/01/18  Educator Vicci  Instruction Review Code 2- Demonstrated Understanding    Meds/Inhalers and oxygen: - Discuss respiratory medications, definition of an inhaler and oxygen, and the proper way to use an inhaler and oxygen. Flowsheet Row PULMONARY REHAB OTHER RESPIRATORY from 11/09/2018 in [place] [facility]  Date 06/08/18  Educator Vicci    Energy Saving Techniques: - Discuss methods to conserve energy and decrease shortness of breath when performing activities of daily living.  Flowsheet Row PULMONARY REHAB OTHER RESPIRATORY from 11/09/2018 in [place] [facility]  Date 11/09/18  Educator DJ  Instruction Review Code 2- Demonstrated Understanding    Bronchial Hygiene / Breathing Techniques: - Discuss breathing mechanics, pursed-lip breathing technique,  proper posture, effective ways to clear airways, and other functional breathing techniques Flowsheet Row PULMONARY REHAB OTHER RESPIRATORY from 11/09/2018 in [place] [facility]  Date 03/23/18  Educator Vicci  Instruction Review Code 2- Demonstrated Understanding    Cleaning Equipment: - Provides group verbal and written instruction about the health risks of elevated stress, cause of high stress, and healthy ways to reduce stress. Flowsheet Row PULMONARY REHAB OTHER RESPIRATORY from 11/09/2018 in [place] [facility]  Date 03/30/18  Educator Vicci  Instruction Review Code 2- Demonstrated Understanding    Nutrition I: Fats: - Discuss the types of cholesterol, what cholesterol does to the body, and how cholesterol levels can be controlled. Flowsheet Row PULMONARY REHAB OTHER RESPIRATORY from 11/09/2018 in [place] [facility]  Date 04/06/18  Educator Vicci  Instruction Review Code 2- Demonstrated Understanding    Nutrition II: Labels: -Discuss the  different components of food labels and how to read food labels. Flowsheet Row PULMONARY REHAB OTHER RESPIRATORY from 11/09/2018 in [place] [facility]  Date 04/20/18  Educator DSABRA Vicci  Instruction Review Code 2- Demonstrated Understanding    Respiratory Infections: - Discuss the signs and symptoms of respiratory infections, ways to prevent respiratory infections, and the importance of seeking medical treatment when having a respiratory infection. Flowsheet Row PULMONARY REHAB OTHER RESPIRATORY from 11/09/2018 in [place] [facility]  Date 04/27/18  Educator Vicci  Instruction Review Code 2- Demonstrated Understanding    Stress I: Signs and Symptoms: - Discuss the causes of stress, how stress may lead to anxiety and depression, and ways to limit stress. Flowsheet Row PULMONARY REHAB OTHER RESPIRATORY from 11/09/2018 in [place] [facility]  Date 05/04/18  Educator MB  Instruction Review Code 2- Demonstrated Understanding    Stress II: Relaxation: -Discuss relaxation techniques to limit stress. Flowsheet Row PULMONARY REHAB OTHER RESPIRATORY from 11/09/2018 in [employer] [facility]  Date 05/11/18  Educator Vicci  Instruction Review Code 2- Demonstrated Understanding    Oxygen for Home/Travel: - Discuss how to prepare for travel when on oxygen and proper ways to transport and store oxygen to ensure safety. Flowsheet Row PULMONARY REHAB OTHER RESPIRATORY from 11/09/2018 in [place] [facility]  Date 05/18/18  Educator DJ  Instruction Review Code 2- Demonstrated Understanding    Knowledge  Questionnaire Score:  Knowledge Questionnaire Score - 02/20/24 1411       Knowledge Questionnaire Score   Pre Score 14/18          Core Components/Risk Factors/Patient Goals at Admission:  Personal Goals and Risk Factors at Admission - 02/20/24 1320       Core Components/Risk Factors/Patient Goals on Admission     Weight Management Obesity    Improve shortness of breath with ADL's Yes    Intervention Provide education, individualized exercise plan and daily activity instruction to help decrease symptoms of SOB with activities of daily living.    Expected Outcomes Short Term: Improve cardiorespiratory fitness to achieve a reduction of symptoms when performing ADLs;Long Term: Be able to perform more ADLs without symptoms or delay the onset of symptoms    Diabetes Yes    Intervention Provide education about signs/symptoms and action to take for hypo/hyperglycemia.;Provide education about proper nutrition, including hydration, and aerobic/resistive exercise prescription along with prescribed medications to achieve blood glucose in normal ranges: Fasting glucose 65-99 mg/dL    Expected Outcomes Short Term: Participant verbalizes understanding of the signs/symptoms and immediate care of hyper/hypoglycemia, proper foot care and importance of medication, aerobic/resistive exercise and nutrition plan for blood glucose control.;Long Term: Attainment of HbA1C < 7%.    Heart Failure Yes    Intervention Provide a combined exercise and nutrition program that is supplemented with education, support and counseling about heart failure. Directed toward relieving symptoms such as shortness of breath, decreased exercise tolerance, and extremity edema.    Expected Outcomes Improve functional capacity of life;Short term: Attendance in program 2-3 days a week with increased exercise capacity. Reported lower sodium intake. Reported increased fruit and vegetable intake. Reports medication compliance.;Short term: Daily weights obtained and reported for increase. Utilizing diuretic protocols set by physician.;Long term: Adoption of self-care skills and reduction of barriers for early signs and symptoms recognition and intervention leading to self-care maintenance.    Hypertension Yes    Intervention Provide education on lifestyle  modifcations including regular physical activity/exercise, weight management, moderate sodium restriction and increased consumption of fresh fruit, vegetables, and low fat dairy, alcohol moderation, and smoking cessation.;Monitor prescription use compliance.    Expected Outcomes Short Term: Continued assessment and intervention until BP is < 140/73mm HG in hypertensive participants. < 130/51mm HG in hypertensive participants with diabetes, heart failure or chronic kidney disease.;Long Term: Maintenance of blood pressure at goal levels.    Lipids Yes    Intervention Provide education and support for participant on nutrition & aerobic/resistive exercise along with prescribed medications to achieve LDL 70mg , HDL >40mg .    Expected Outcomes Short Term: Participant states understanding of desired cholesterol values and is compliant with medications prescribed. Participant is following exercise prescription and nutrition guidelines.;Long Term: Cholesterol controlled with medications as prescribed, with individualized exercise RX and with personalized nutrition plan. Value goals: LDL < 70mg , HDL > 40 mg.          Core Components/Risk Factors/Patient Goals Review:   Goals and Risk Factor Review     Row Name 03/02/24 1342             Core Components/Risk Factors/Patient Goals Review   Personal Goals Review Weight Management/Obesity;Improve shortness of breath with ADL's;Develop more efficient breathing techniques such as purse lipped breathing and diaphragmatic breathing and practicing self-pacing with activity.;Diabetes       Review Texanna is doing great in rehab! She is currently working with a neurologist and optometrist to figure  out what is going on with her right eye. She mentions the neurologist adding baby aspirin  and a blood thinner (plavix ?) to her medications, she will start those tomorrow. Jenesys is diabetic and checks her sugar regularly at home.       Expected Outcomes Short: start ASA and  blood thinner prescribed. Long: Continue to follow up with MDs and take medications as prescribed.          Core Components/Risk Factors/Patient Goals at Discharge (Final Review):   Goals and Risk Factor Review - 03/02/24 1342       Core Components/Risk Factors/Patient Goals Review   Personal Goals Review Weight Management/Obesity;Improve shortness of breath with ADL's;Develop more efficient breathing techniques such as purse lipped breathing and diaphragmatic breathing and practicing self-pacing with activity.;Diabetes    Review Ronette is doing great in rehab! She is currently working with a neurologist and optometrist to figure out what is going on with her right eye. She mentions the neurologist adding baby aspirin  and a blood thinner (plavix ?) to her medications, she will start those tomorrow. Sheyna is diabetic and checks her sugar regularly at home.    Expected Outcomes Short: start ASA and blood thinner prescribed. Long: Continue to follow up with MDs and take medications as prescribed.          ITP Comments:  ITP Comments     Row Name 02/20/24 1444 02/22/24 0836 02/22/24 1412 03/20/24 1039     ITP Comments Patient arrived for 1st visit/orientation/education at 1300. Patient was referred to PR by Dr. Jayson Sierras due to DOE/Chronic Diastolic HF. During orientation advised patient on arrival and appointment times what to wear, what to do before, during and after exercise. Reviewed attendance and class policy.  Pt is scheduled to return Pulmonary Rehab on 02/22/24 at 1430. Pt was advised to come to class 15 minutes before class starts.  Discussed RPE/Dpysnea scales. Patient participated in warm up stretches. Patient was able to complete 6 minute walk test. Patient was measured for the equipment. Discussed equipment safety with patient. Took patient pre-anthropometric measurements. Patient finished visit at 1410. 30 day review completed. ITP sent to Dr.Jehanzeb Memon, Medical Director of   Pulmonary Rehab. Continue with ITP unless changes are made by physician.  New to the program. First full day of exercise!  Patient was oriented to gym and equipment including functions, settings, policies, and procedures.  Patient's individual exercise prescription and treatment plan were reviewed.  All starting workloads were established based on the results of the 6 minute walk test done at initial orientation visit.  The plan for exercise progression was also introduced and progression will be customized based on patient's performance and goals. 30 day review completed. ITP sent to Dr.Jehanzeb Memon, Medical Director of  Pulmonary Rehab. Continue with ITP unless changes are made by physician.       Comments: 30 day review

## 2024-03-21 ENCOUNTER — Encounter (HOSPITAL_COMMUNITY)

## 2024-03-21 ENCOUNTER — Ambulatory Visit (INDEPENDENT_AMBULATORY_CARE_PROVIDER_SITE_OTHER)

## 2024-03-21 DIAGNOSIS — I5032 Chronic diastolic (congestive) heart failure: Secondary | ICD-10-CM | POA: Diagnosis not present

## 2024-03-22 ENCOUNTER — Ambulatory Visit: Payer: Self-pay | Admitting: Cardiovascular Disease

## 2024-03-22 DIAGNOSIS — H43813 Vitreous degeneration, bilateral: Secondary | ICD-10-CM | POA: Diagnosis not present

## 2024-03-22 DIAGNOSIS — E119 Type 2 diabetes mellitus without complications: Secondary | ICD-10-CM | POA: Diagnosis not present

## 2024-03-22 DIAGNOSIS — Z961 Presence of intraocular lens: Secondary | ICD-10-CM | POA: Diagnosis not present

## 2024-03-22 DIAGNOSIS — H5310 Unspecified subjective visual disturbances: Secondary | ICD-10-CM | POA: Diagnosis not present

## 2024-03-22 DIAGNOSIS — H353131 Nonexudative age-related macular degeneration, bilateral, early dry stage: Secondary | ICD-10-CM | POA: Diagnosis not present

## 2024-03-22 LAB — CUP PACEART REMOTE DEVICE CHECK
Battery Remaining Longevity: 29 mo
Battery Remaining Percentage: 28 %
Battery Voltage: 2.95 V
Brady Statistic AP VP Percent: 5 %
Brady Statistic AP VS Percent: 1 %
Brady Statistic AS VP Percent: 95 %
Brady Statistic AS VS Percent: 1 %
Brady Statistic RA Percent Paced: 4.8 %
Brady Statistic RV Percent Paced: 99 %
Date Time Interrogation Session: 20251203020013
Implantable Lead Connection Status: 753985
Implantable Lead Connection Status: 753985
Implantable Lead Implant Date: 20190306
Implantable Lead Implant Date: 20190306
Implantable Lead Location: 753859
Implantable Lead Location: 753860
Implantable Lead Model: 5076
Implantable Lead Model: 5076
Implantable Pulse Generator Implant Date: 20190306
Lead Channel Impedance Value: 340 Ohm
Lead Channel Impedance Value: 450 Ohm
Lead Channel Pacing Threshold Amplitude: 0.5 V
Lead Channel Pacing Threshold Amplitude: 0.75 V
Lead Channel Pacing Threshold Pulse Width: 0.5 ms
Lead Channel Pacing Threshold Pulse Width: 0.5 ms
Lead Channel Sensing Intrinsic Amplitude: 3.2 mV
Lead Channel Sensing Intrinsic Amplitude: 6.3 mV
Lead Channel Setting Pacing Amplitude: 2 V
Lead Channel Setting Pacing Amplitude: 2.5 V
Lead Channel Setting Pacing Pulse Width: 0.5 ms
Lead Channel Setting Sensing Sensitivity: 4 mV
Pulse Gen Model: 2272
Pulse Gen Serial Number: 9001027

## 2024-03-23 ENCOUNTER — Ambulatory Visit: Attending: Cardiovascular Disease | Admitting: Cardiovascular Disease

## 2024-03-23 ENCOUNTER — Encounter: Payer: Self-pay | Admitting: Cardiovascular Disease

## 2024-03-23 ENCOUNTER — Encounter (HOSPITAL_COMMUNITY)

## 2024-03-23 VITALS — BP 145/79 | HR 66 | Ht 65.5 in | Wt 214.6 lb

## 2024-03-23 DIAGNOSIS — I441 Atrioventricular block, second degree: Secondary | ICD-10-CM | POA: Diagnosis not present

## 2024-03-23 LAB — CUP PACEART INCLINIC DEVICE CHECK
Battery Remaining Longevity: 30 mo
Battery Voltage: 2.95 V
Brady Statistic RA Percent Paced: 4.8 %
Brady Statistic RV Percent Paced: 99.97 %
Date Time Interrogation Session: 20251205151843
Implantable Lead Connection Status: 753985
Implantable Lead Connection Status: 753985
Implantable Lead Implant Date: 20190306
Implantable Lead Implant Date: 20190306
Implantable Lead Location: 753859
Implantable Lead Location: 753860
Implantable Lead Model: 5076
Implantable Lead Model: 5076
Implantable Pulse Generator Implant Date: 20190306
Lead Channel Impedance Value: 337.5 Ohm
Lead Channel Impedance Value: 487.5 Ohm
Lead Channel Pacing Threshold Amplitude: 0.5 V
Lead Channel Pacing Threshold Amplitude: 0.5 V
Lead Channel Pacing Threshold Amplitude: 0.75 V
Lead Channel Pacing Threshold Amplitude: 0.75 V
Lead Channel Pacing Threshold Pulse Width: 0.5 ms
Lead Channel Pacing Threshold Pulse Width: 0.5 ms
Lead Channel Pacing Threshold Pulse Width: 0.5 ms
Lead Channel Pacing Threshold Pulse Width: 0.5 ms
Lead Channel Sensing Intrinsic Amplitude: 3.8 mV
Lead Channel Sensing Intrinsic Amplitude: 6.3 mV
Lead Channel Setting Pacing Amplitude: 2 V
Lead Channel Setting Pacing Amplitude: 2.5 V
Lead Channel Setting Pacing Pulse Width: 0.5 ms
Lead Channel Setting Sensing Sensitivity: 4 mV
Pulse Gen Model: 2272
Pulse Gen Serial Number: 9001027

## 2024-03-23 NOTE — Progress Notes (Signed)
  Electrophysiology Office Note:    Date:  03/23/2024   ID:  CHANCI OJALA, DOB August 14, 1940, MRN [id number]  PCP:  Tobie Suzzane POUR, MD   [facility] Providers Cardiologist:  Jayson Sierras, MD Electrophysiologist:  Eulas FORBES Furbish, MD     Referring MD: Tobie Suzzane POUR, MD   History of Present Illness:    Julie Clay is a 83 y.o. 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     She is a [employer] pacemaker placed by Dr. Fernande for second-degree type II heart block.  She has a history of left lymphedema, so the device was placed on the right.     Today, she reports that she is doing well. she has no device related complaints -- no new tenderness, drainage, redness.   EKGs/Labs/Other Studies Reviewed Today:     Echocardiogram:  TTE July 21, 2020 EF 55-60%. Left atrium mildly dilated. Gr I diastolic dysfunction     Advanced imaging:  CT coronary 04/10/2022 Low likelihood of hemodynamic difficult disease    EKG:         Physical Exam:    VS:  BP (!) 145/79 (BP Location: Right Arm, Cuff Size: Large)   Pulse 66   Ht 5' 5.5 (1.664 m)   Wt 214 lb 9.6 oz (97.3 kg)   SpO2 98%   BMI 35.17 kg/m     Wt Readings from Last 3 Encounters:  03/23/24 214 lb 9.6 oz (97.3 kg)  03/01/24 216 lb (98 kg)  02/27/24 216 lb 11.4 oz (98.3 kg)     GEN:  Well nourished, well developed in no acute distress CARDIAC: RRR, no murmurs, rubs, gallops The device site is normal -- no tenderness, edema, drainage, redness, threatened erosion.  RESPIRATORY:  Normal work of breathing MUSCULOSKELETAL: trace edema    ASSESSMENT & PLAN:     Second degree AV block St Jude pacemaker in place I reviewed today's device interrogation.  See Paceart for details Device is functioning normally She is device dependent today  CHFpEF Appears  compensated      Signed, Eulas FORBES Furbish, MD  03/23/2024 2:57 PM    [facility]

## 2024-03-23 NOTE — Patient Instructions (Signed)

## 2024-03-24 ENCOUNTER — Encounter (HOSPITAL_COMMUNITY): Payer: Self-pay | Admitting: *Deleted

## 2024-03-24 ENCOUNTER — Other Ambulatory Visit: Payer: Self-pay

## 2024-03-24 ENCOUNTER — Emergency Department (HOSPITAL_COMMUNITY)
Admission: EM | Admit: 2024-03-24 | Discharge: 2024-03-24 | Disposition: A | Discharge status: Home / Self Care | Attending: Emergency Medicine | Admitting: Emergency Medicine

## 2024-03-24 DIAGNOSIS — R350 Frequency of micturition: Secondary | ICD-10-CM | POA: Diagnosis not present

## 2024-03-24 DIAGNOSIS — Z7902 Long term (current) use of antithrombotics/antiplatelets: Secondary | ICD-10-CM | POA: Diagnosis not present

## 2024-03-24 DIAGNOSIS — R300 Dysuria: Secondary | ICD-10-CM | POA: Insufficient documentation

## 2024-03-24 DIAGNOSIS — R310 Gross hematuria: Secondary | ICD-10-CM | POA: Diagnosis not present

## 2024-03-24 DIAGNOSIS — Z7982 Long term (current) use of aspirin: Secondary | ICD-10-CM | POA: Diagnosis not present

## 2024-03-24 LAB — URINALYSIS, W/ REFLEX TO CULTURE (INFECTION SUSPECTED): RBC / HPF: >50 RBC/hpf (ref 0–5)

## 2024-03-24 MED ORDER — CEPHALEXIN 500 MG PO CAPS: 500.0000 mg | ORAL_CAPSULE | Freq: Four times a day (QID) | ORAL | 0 refills | Status: DC

## 2024-03-24 MED ORDER — PHENAZOPYRIDINE HCL 100 MG PO TABS
200.0000 mg | ORAL_TABLET | Freq: Once | ORAL | Status: AC
  Administered 2024-03-24: 200 mg via ORAL
  Filled 2024-03-24: qty 2

## 2024-03-24 MED ORDER — CEPHALEXIN 500 MG PO CAPS
500.0000 mg | ORAL_CAPSULE | Freq: Once | ORAL | Status: AC
  Administered 2024-03-24: 500 mg via ORAL
  Filled 2024-03-24: qty 1

## 2024-03-24 MED ORDER — PHENAZOPYRIDINE HCL 200 MG PO TABS: 200.0000 mg | ORAL_TABLET | Freq: Three times a day (TID) | ORAL | 0 refills | Status: DC

## 2024-03-24 NOTE — ED Provider Notes (Signed)
  EMERGENCY DEPARTMENT AT [facility] Provider Note   CSN: [id number] Arrival date & time: 03/24/24  1805     Patient presents with: Dysuria   Julie Clay is a 83 y.o. female.   Patient to ED with isolated symptoms of dysuria that started 4 days ago, with hematuria today. No fever, nausea, vomiting or flank pain.   The history is provided by the patient. No language interpreter was used.  Dysuria      Prior to Admission medications   Medication Sig Start Date End Date Taking? Authorizing Provider  cephALEXin  (KEFLEX ) 500 MG capsule Take 1 capsule (500 mg total) by mouth 4 (four) times daily. 03/24/24  Yes Whitfield Dulay, Margit, PA-C  phenazopyridine  (PYRIDIUM ) 200 MG tablet Take 1 tablet (200 mg total) by mouth 3 (three) times daily. 03/24/24  Yes Parsa Rickett, Margit, PA-C  acetaminophen  (TYLENOL ) 500 MG tablet Take 1,000 mg by mouth every 6 (six) hours as needed for moderate pain.    [provider]  albuterol  (VENTOLIN  HFA) 108 (90 Base) MCG/ACT inhaler Inhale 2 puffs into the lungs every 6 (six) hours as needed for wheezing or shortness of breath. 04/09/20   Bari Theodoro FALCON, MD  Aloe Vera Leaf  POWD Take 800 mg by mouth 2 (two) times daily.    [provider]  Ascorbic Acid  (VITAMIN C ) 1000 MG tablet Take 1,000 mg by mouth 2 (two) times daily.    [provider]  aspirin  EC 81 MG tablet Take 81 mg by mouth daily. Swallow whole.    [provider]  b complex vitamins tablet Take 1 tablet by mouth 2 (two) times daily.     [provider]  Blood Glucose Monitoring Suppl (BLOOD GLUCOSE SYSTEM PAK) KIT Use as directed to monitor FSBS 1x daily. Dx: E11.9. Please dispense as Accu-Chek Aviva 01/18/18   [place], Theodoro FALCON, MD  Calcium  Polycarbophil (FIBER-CAPS PO) Take 2-3 capsules by mouth in the morning and at bedtime.    [provider]  Cholecalciferol  (VITAMIN D3) 2000 UNITS TABS Take 2,000 Units by mouth 2 (two) times  daily.    [provider]  clopidogrel  (PLAVIX ) 75 MG tablet Take 1 tablet (75 mg total) by mouth daily. 03/01/24   Sethi, Pramod S, MD  clotrimazole -betamethasone  (LOTRISONE ) cream Apply 1 Application topically daily. Patient taking differently: Apply 1 Application topically daily as needed (groin rash). 01/03/24   Tobie Suzzane POUR, MD  dapagliflozin  propanediol (FARXIGA ) 10 MG TABS tablet Take 1 tablet (10 mg total) by mouth daily before breakfast. 06/28/23   Patel, Rutwik K, MD  fluocinonide  cream (LIDEX ) 0.05 % Apply 1 Application topically 2 (two) times daily as needed (poison ivy/bites/hives). 06/25/22   Tobie Suzzane POUR, MD  Ginkgo Biloba 120 MG CAPS Take 120 mg by mouth in the morning and at bedtime.     [provider]  Glucose Blood (BLOOD GLUCOSE TEST STRIPS) STRP Use as directed to monitor FSBS 1x daily. Dx: E11.9. Please dispense as Accu-Chek Aviva 01/18/18   [place], Theodoro FALCON, MD  KRILL OIL PO Take 1,500 mg by mouth 2 (two) times daily.    [provider]  Lancets MISC Use as directed to monitor FSBS 1x daily. Dx: E11.9. Please dispense as Accu-Chek Aviva 01/18/18   [place], Theodoro FALCON, MD  metoprolol  succinate (TOPROL -XL) 25 MG 24 hr tablet Take 1/2 (one-half) tablet by mouth once daily Patient taking differently: Take 12.5 mg by mouth daily. 06/16/23   Debera Jayson MATSU,  MD  Multiple Vitamin (MULTIVITAMIN WITH MINERALS) TABS tablet Take 1 tablet by mouth daily.    [provider]  naftifine  (NAFTIN ) 1 % cream Apply topically 2 (two) times daily as needed. Patient taking differently: Apply 1 Application topically 2 (two) times daily as needed (between toes itching). 12/04/19   [place], Theodoro FALCON, MD  olopatadine (PATADAY) 0.1 % ophthalmic solution Place 1 drop into both eyes daily as needed for allergies.    [provider]  Probiotic Product (PROBIOTIC ADVANCED PO) Take 1 capsule by mouth daily.    [provider]  psyllium (REGULOID) 0.52 g  capsule Take 2-3 capsules by mouth in the morning and at bedtime. 3 caps am, 2 caps pm    [provider]  rosuvastatin  (CRESTOR ) 5 MG tablet Take 1 tablet (5 mg total) by mouth daily. for cholesterol. 01/03/24   Tobie Suzzane POUR, MD  Semaglutide , 1 MG/DOSE, (OZEMPIC , 1 MG/DOSE,) 4 MG/3ML SOPN Inject 1 mg into the skin once a week. 01/03/24   Tobie Suzzane POUR, MD  simethicone  (MYLICON) 80 MG chewable tablet Chew 80 mg by mouth as needed for flatulence.     [provider]  tiZANidine  (ZANAFLEX ) 4 MG tablet TAKE 1 TABLET BY MOUTH EVERY 8 HOURS AS NEEDED FOR MUSCLE SPASM Patient taking differently: Take 2 mg by mouth at bedtime. 02/06/24   Tobie Suzzane POUR, MD    Allergies: Adhesive [tape], Arimidex  [anastrozole ], Other, Tetanus toxoid-containing vaccines, Tetracyclines & related, and Lisinopril     Review of Systems  Genitourinary:  Positive for dysuria.    Updated Vital Signs BP (!) 152/59 (BP Location: Right Arm)   Pulse 100   Temp 98.4 F (36.9 C) (Oral)   Resp 19   Ht 5' 5 (1.651 m)   Wt 95.3 kg   SpO2 96%   BMI 34.95 kg/m   Physical Exam Constitutional:      Appearance: She is well-developed.  HENT:     Head: Normocephalic.  Cardiovascular:     Rate and Rhythm: Normal rate and regular rhythm.     Heart sounds: No murmur heard. Pulmonary:     Effort: Pulmonary effort is normal.     Breath sounds: Normal breath sounds. No wheezing, rhonchi or rales.  Abdominal:     Palpations: Abdomen is soft.     Tenderness: There is no abdominal tenderness. There is no right CVA tenderness, left CVA tenderness, guarding or rebound.  Musculoskeletal:        General: Normal range of motion.     Cervical back: Normal range of motion and neck supple.  Skin:    General: Skin is warm and dry.  Neurological:     General: No focal deficit present.     Mental Status: She is alert and oriented to person, place, and time.     (all labs ordered are listed, but only abnormal  results are displayed) Labs Reviewed  URINALYSIS, W/ REFLEX TO CULTURE (INFECTION SUSPECTED) - Abnormal; Notable for the following components:      Result Value   Color, Urine BROWN (*)    APPearance TURBID (*)    Glucose, UA   (*)    Value: TEST NOT REPORTED DUE TO COLOR INTERFERENCE OF URINE PIGMENT   Hgb urine dipstick   (*)    Value: TEST NOT REPORTED DUE TO COLOR INTERFERENCE OF URINE PIGMENT   Bilirubin Urine   (*)    Value: TEST NOT REPORTED DUE TO COLOR INTERFERENCE OF URINE  PIGMENT   Ketones, ur   (*)    Value: TEST NOT REPORTED DUE TO COLOR INTERFERENCE OF URINE PIGMENT   Protein, ur   (*)    Value: TEST NOT REPORTED DUE TO COLOR INTERFERENCE OF URINE PIGMENT   Nitrite   (*)    Value: TEST NOT REPORTED DUE TO COLOR INTERFERENCE OF URINE PIGMENT   Leukocytes,Ua   (*)    Value: TEST NOT REPORTED DUE TO COLOR INTERFERENCE OF URINE PIGMENT   Bacteria, UA RARE (*)    All other components within normal limits    EKG: None  Radiology: CUP PACEART INCLINIC DEVICE CHECK Result Date: 03/23/2024 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 Routine testing of thresholds, sensing, and impedance demonstrate stable parameters and no programming changes needed at this time. No episodes. Estimated longevity 2.3 years. Pt  enrolled in remote follow-up. Noted to have mixed system w/ [employer] leads.Rozelle Banter, BSN, RN    Procedures   Medications Ordered in the ED  cephALEXin  (KEFLEX ) capsule 500 mg (has no administration in time range)  phenazopyridine  (PYRIDIUM ) tablet 200 mg (has no administration in time range)    Clinical Course as of 03/24/24 1856  Sat Mar 24, 2024  1827 Very well appearing patient with symptoms familiar to her as urinary tract infection. No fever. Not vomiting, no abdominal or flank pain. UA pending, reflex to culture. [SU]  1851 UA indeterminate. Shared decision making with the patient. No further labs felt indicated this  evening with symptoms limited to dysuria and hematuria. She is comfortable with plan of discharge on Keflex , pyridium  for pain, follow up with PCP in 2-3 days for recheck of symptoms and review of culture. Discussed specific symptoms that should prompt return to the ED. [SU]    Clinical Course User Index [SU] Odell Balls, PA-C                                 Medical Decision Making Risk Prescription drug management.        Final diagnoses:  Dysuria  Gross hematuria    ED Discharge Orders          Ordered    cephALEXin  (KEFLEX ) 500 MG capsule  4 times daily        03/24/24 1854    phenazopyridine  (PYRIDIUM ) 200 MG tablet  3 times daily        03/24/24 1854               Odell Balls, PA-C 03/24/24 1856    Melvenia Motto, MD 03/25/24 [phone]

## 2024-03-24 NOTE — ED Triage Notes (Signed)
 Pt with frequent urination first noted Thursday night, hematuria today. Discomfort with urination. Denies N/V or fevers.

## 2024-03-24 NOTE — ED Notes (Signed)
 Pt/family received d/c paperwork at this time. After going over the paperwork any questions, comments, or concerns were answered to the best of this nurse's knowledge. The pt/family verbally acknowledged the teachings/instructions.

## 2024-03-24 NOTE — Discharge Instructions (Addendum)
 As we discussed, plan to see your doctor in 2-3 days to recheck your symptoms and review with urine culture pending tonight. Take Keflex  for infection, and pyridium  for pain with urination as prescribed.   If you develop a fever, nausea/vomiting, any pain (abdominal or flank), or new symptom of concern, return to the ED for further evaluation.

## 2024-03-24 NOTE — ED Notes (Signed)
 Pt ambulated from triage to her room and was given a urine cup to provide a specimen.

## 2024-03-26 ENCOUNTER — Encounter (HOSPITAL_COMMUNITY)

## 2024-03-26 ENCOUNTER — Telehealth: Payer: Self-pay

## 2024-03-26 NOTE — Telephone Encounter (Signed)
 Copied from CRM #[id number]. Topic: Appointments - Scheduling Inquiry for Clinic >> Mar 26, 2024 10:24 AM Gustabo D wrote: Pt went to ER due to a UTI they said she has a UTI was given a antibotic and something to make the urine orange. They told her to fu with pcp  There are no appts available until Jan pt says they told he she needs to be seen by Wednesday . She was not admitted to the hospital

## 2024-03-27 ENCOUNTER — Encounter: Payer: Self-pay | Admitting: Internal Medicine

## 2024-03-27 ENCOUNTER — Ambulatory Visit: Admitting: Internal Medicine

## 2024-03-27 VITALS — BP 124/80 | HR 87 | Ht 65.5 in | Wt 211.4 lb

## 2024-03-27 DIAGNOSIS — E1159 Type 2 diabetes mellitus with other circulatory complications: Secondary | ICD-10-CM

## 2024-03-27 DIAGNOSIS — Z09 Encounter for follow-up examination after completed treatment for conditions other than malignant neoplasm: Secondary | ICD-10-CM

## 2024-03-27 DIAGNOSIS — H539 Unspecified visual disturbance: Secondary | ICD-10-CM | POA: Diagnosis not present

## 2024-03-27 DIAGNOSIS — I152 Hypertension secondary to endocrine disorders: Secondary | ICD-10-CM

## 2024-03-27 DIAGNOSIS — E1169 Type 2 diabetes mellitus with other specified complication: Secondary | ICD-10-CM

## 2024-03-27 DIAGNOSIS — E1140 Type 2 diabetes mellitus with diabetic neuropathy, unspecified: Secondary | ICD-10-CM | POA: Diagnosis not present

## 2024-03-27 DIAGNOSIS — N390 Urinary tract infection, site not specified: Secondary | ICD-10-CM | POA: Diagnosis not present

## 2024-03-27 DIAGNOSIS — I10 Essential (primary) hypertension: Secondary | ICD-10-CM

## 2024-03-27 NOTE — Assessment & Plan Note (Signed)
 ER chart reviewed, including urine tests and recent CT Head Advised to complete course of Keflex  for UTI

## 2024-03-27 NOTE — Assessment & Plan Note (Signed)
 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

## 2024-03-27 NOTE — Assessment & Plan Note (Addendum)
 Recent episode of hematuria likely due to DAPT Considering her dysuria, she was given Keflex  from the ER She has mild abdominal discomfort from Keflex , but overall tolerating it well Check urine culture as the initial test from the ER was indeterminate Advised to maintain at least 64 ounces of fluid intake in a day

## 2024-03-27 NOTE — Progress Notes (Signed)
 Acute Office Visit  Subjective:    Patient ID: Julie Clay, female    DOB: 23-Apr-1940, 83 y.o.   MRN: [id number]  Chief Complaint  Patient presents with   Recurrent UTI    Follow up, states antibiotic is working, however causing abdominal pain.    HPI Patient is in today for follow-up of recent ER visit on 03/24/24 for UTI.  She was having dysuria and hematuria.  Her UA was indeterminate in the ER due to hematuria.  She was given Keflex , which has improved her symptoms.  She had episodes of hematuria prior to the ER visit, which is resolved since starting antibiotic.  Of note, she was also taking Plavix  in addition to aspirin  due to recent episode of questionable TIA, which she has stopped taking now.  Denies any episode of melena or hematochezia.  Denies any fever or chills.  She had ER visit on 02/27/24 for transient visual disturbance.  She was evaluated by neurology in the outpatient setting, who recommended to have formal ophthalmology evaluation.  She was evaluated by [facility] ophthalmology, and was told of benign eye exam.  She was placed on DAPT with aspirin  and Plavix , which she has taken for 3 weeks.  She had to stop taking Plavix  due to episode of hematuria in the last week.  Denies any acute visual disturbance since that episode.  Type II DM: She reports that her blood glucose has been around 140s in the mornings since stopping glipizide .  She still takes Ozempic  1 mg QW and Farxiga  10 mg QD.  Denies any episode of hypoglycemia recently.  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    Osteoarthritis    both knees, lower back, both shoulders; bone spurs to feet   Rectocele 06/25/2013   Posterior repair 11/06/13    Type II diabetes mellitus (HCC)    Ventral hernia     Past Surgical History:  Procedure Laterality Date   BREAST BIOPSY Left 04/2011   BREAST BIOPSY Left 06/22/13   BREAST BIOPSY Left 09/12/2013   Procedure: BREAST BIOPSY WITH NEEDLE LOCALIZATION;  Surgeon: Oneil DELENA Budge, MD;  Location: [facility];  Service: General;  Laterality: Left;   BREAST LUMPECTOMY Left 04/2011 X 2   BREAST SURGERY N/A    Phreesia 01/17/2020   CATARACT EXTRACTION W/ INTRAOCULAR LENS  IMPLANT, BILATERAL Bilateral    CATARACT EXTRACTION, BILATERAL     on different occassions    CESAREAN SECTION  1973   COLON SURGERY N/A    2014   DILATION AND CURETTAGE OF UTERUS  11/06/2013   HAND LIGAMENT RECONSTRUCTION Right ~ 2010   HYSTEROSCOPY WITH D & C  N/A 11/06/2013   Procedure: DILATATION AND CURETTAGE /HYSTEROSCOPY;  Surgeon: Norleen Edsel GAILS, MD;  Location: [facility];  Service: Gynecology;  Laterality: N/A;   INSERT / REPLACE / REMOVE PACEMAKER  06/22/2017   LUNG LOBECTOMY Right 1988   Fungal Infection   PACEMAKER IMPLANT N/A 06/22/2017   Procedure: PACEMAKER IMPLANT;  Surgeon: Fernande Elspeth BROCKS, MD;  Location: [facility] INVASIVE CV LAB;  Service: Cardiovascular;  Laterality: N/A;   PARTIAL MASTECTOMY WITH NEEDLE LOCALIZATION Right 01/02/2020   Procedure: RIGHT PARTIAL MASTECTOMY AFTER NEEDLE LOCALIZATION;  Surgeon: Mavis Anes, MD;  Location: [facility];  Service: General;  Laterality: Right;   POLYPECTOMY N/A 11/06/2013   Procedure: POLYPECTOMY (REMOVAL ENDOMETRIAL POLYP);  Surgeon: Norleen Edsel GAILS, MD;  Location: [facility];  Service: Gynecology;  Laterality: N/A;    RECTOCELE REPAIR N/A 11/06/2013   Procedure: POSTERIOR REPAIR (RECTOCELE);  Surgeon: Norleen Edsel GAILS, MD;  Location: [facility];  Service: Gynecology;  Laterality: N/A;   SHOULDER ARTHROSCOPY WITH OPEN ROTATOR CUFF REPAIR Left 07/26/2012   TONSILLECTOMY  1949   TUBAL LIGATION      Family History  Problem Relation Age of Onset   Hypertension Mother    Heart disease Mother    Diabetes Mother    Kidney disease Mother        ESRD   Cancer Mother        Bladder   Hypertension Father    Hyperlipidemia Father    Heart disease Father    Diabetes Father    Cancer Father        Stomach Cancer   Diabetes Sister    Heart disease Sister    Alzheimer's disease Paternal Aunt    Stroke Maternal Grandfather    Cancer Paternal Grandfather        stomach   Cancer Maternal Uncle        prostate   Cancer Paternal Uncle        stomach    Social History   Socioeconomic History   Marital status: Married    Spouse name: Darleene   Number of children: 2   Years of education: 12   Highest education level: Some college, no degree  Occupational History   Occupation: retired     Comment: advice worker for [employer]  Tobacco Use   Smoking status: Never   Smokeless tobacco: Never  Vaping Use   Vaping status: Never Used  Substance and Sexual Activity   Alcohol use: Not Currently    Comment: Twice a year   Drug use: No   Sexual activity: Not Currently    Birth control/protection: Surgical  Other Topics Concern   Not on file  Social History Narrative   Right handed   Caffeine use: 1 mug coffee per day. Tea sometimes. Soda during summer/once a month   Social Drivers of Corporate Investment Banker Strain: Low Risk  (05/10/2023)   Overall Financial Resource Strain (CARDIA)    Difficulty of Paying Living Expenses: Not hard at all  Food Insecurity: No Food Insecurity (05/10/2023)   Hunger Vital Sign    Worried About Running Out of Food in the Last Year: Never true    Ran Out of Food in the  Last Year: Never true  Transportation Needs: No Transportation Needs (05/10/2023)   PRAPARE - Administrator, Civil Service (Medical): No    Lack of Transportation (Non-Medical): No  Physical Activity: Inactive (05/10/2023)   Exercise Vital Sign  Days of Exercise per Week: 0 days    Minutes of Exercise per Session: 0 min  Stress: No Stress Concern Present (05/10/2023)   [employer] of Occupational Health - Occupational Stress Questionnaire    Feeling of Stress : Not at all  Social Connections: Socially Integrated (05/10/2023)   Social Connection and Isolation Panel    Frequency of Communication with Friends and Family: More than three times a week    Frequency of Social Gatherings with Friends and Family: More than three times a week    Attends Religious Services: More than 4 times per year    Active Member of [employer] or Organizations: Yes    Attends Engineer, Structural: More than 4 times per year    Marital Status: Married  Catering Manager Violence: Not At Risk (05/10/2023)   Humiliation, Afraid, Rape, and Kick questionnaire    Fear of Current or Ex-Partner: No    Emotionally Abused: No    Physically Abused: No    Sexually Abused: No    Outpatient Medications Prior to Visit  Medication Sig Dispense Refill   acetaminophen  (TYLENOL ) 500 MG tablet Take 1,000 mg by mouth every 6 (six) hours as needed for moderate pain.     albuterol  (VENTOLIN  HFA) 108 (90 Base) MCG/ACT inhaler Inhale 2 puffs into the lungs every 6 (six) hours as needed for wheezing or shortness of breath. 8 g 0   Aloe Vera Leaf  POWD Take 800 mg by mouth 2 (two) times daily.     Ascorbic Acid  (VITAMIN C ) 1000 MG tablet Take 1,000 mg by mouth 2 (two) times daily.     aspirin  EC 81 MG tablet Take 81 mg by mouth daily. Swallow whole.     b complex vitamins tablet Take 1 tablet by mouth 2 (two) times daily.      Blood Glucose Monitoring Suppl (BLOOD GLUCOSE SYSTEM PAK) KIT Use as directed to monitor  FSBS 1x daily. Dx: E11.9. Please dispense as Accu-Chek Aviva 1 each 1   Calcium  Polycarbophil (FIBER-CAPS PO) Take 2-3 capsules by mouth in the morning and at bedtime.     cephALEXin  (KEFLEX ) 500 MG capsule Take 1 capsule (500 mg total) by mouth 4 (four) times daily. 20 capsule 0   Cholecalciferol  (VITAMIN D3) 2000 UNITS TABS Take 2,000 Units by mouth 2 (two) times daily.     clotrimazole -betamethasone  (LOTRISONE ) cream Apply 1 Application topically daily. (Patient taking differently: Apply 1 Application topically daily as needed (groin rash).) 30 g 0   dapagliflozin  propanediol (FARXIGA ) 10 MG TABS tablet Take 1 tablet (10 mg total) by mouth daily before breakfast. 90 tablet 3   fluocinonide  cream (LIDEX ) 0.05 % Apply 1 Application topically 2 (two) times daily as needed (poison ivy/bites/hives). 30 g 11   Ginkgo Biloba 120 MG CAPS Take 120 mg by mouth in the morning and at bedtime.      Glucose Blood (BLOOD GLUCOSE TEST STRIPS) STRP Use as directed to monitor FSBS 1x daily. Dx: E11.9. Please dispense as Accu-Chek Aviva 100 each 1   KRILL OIL PO Take 1,500 mg by mouth 2 (two) times daily.     Lancets MISC Use as directed to monitor FSBS 1x daily. Dx: E11.9. Please dispense as Accu-Chek Aviva 100 each 1   metoprolol  succinate (TOPROL -XL) 25 MG 24 hr tablet Take 1/2 (one-half) tablet by mouth once daily (Patient taking differently: Take 12.5 mg by mouth daily.) 45 tablet 3   Multiple Vitamin (MULTIVITAMIN WITH MINERALS) TABS tablet  Take 1 tablet by mouth daily.     naftifine  (NAFTIN ) 1 % cream Apply topically 2 (two) times daily as needed. (Patient taking differently: Apply 1 Application topically 2 (two) times daily as needed (between toes itching).) 60 g 2   olopatadine (PATADAY) 0.1 % ophthalmic solution Place 1 drop into both eyes daily as needed for allergies.     Probiotic Product (PROBIOTIC ADVANCED PO) Take 1 capsule by mouth daily.     psyllium (REGULOID) 0.52 g capsule Take 2-3 capsules by  mouth in the morning and at bedtime. 3 caps am, 2 caps pm     rosuvastatin  (CRESTOR ) 5 MG tablet Take 1 tablet (5 mg total) by mouth daily. for cholesterol. 90 tablet 3   Semaglutide , 1 MG/DOSE, (OZEMPIC , 1 MG/DOSE,) 4 MG/3ML SOPN Inject 1 mg into the skin once a week. 9 mL 3   simethicone  (MYLICON) 80 MG chewable tablet Chew 80 mg by mouth as needed for flatulence.      tiZANidine  (ZANAFLEX ) 4 MG tablet TAKE 1 TABLET BY MOUTH EVERY 8 HOURS AS NEEDED FOR MUSCLE SPASM (Patient taking differently: Take 2 mg by mouth at bedtime.) 90 tablet 0   clopidogrel  (PLAVIX ) 75 MG tablet Take 1 tablet (75 mg total) by mouth daily. 30 tablet 11   phenazopyridine  (PYRIDIUM ) 200 MG tablet Take 1 tablet (200 mg total) by mouth 3 (three) times daily. 6 tablet 0   No facility-administered medications prior to visit.    Allergies  Allergen Reactions   Adhesive [Tape] Other (See Comments)    Red and blistered   Arimidex  [Anastrozole ] Other (See Comments)    Arthralgias and myalgias.  Improved with 3 week hiatus from drug.  Categorical side effect of drug class.   Other Hives and Itching   Tetanus Toxoid-Containing Vaccines Other (See Comments)    Unknown    Tetracyclines & Related Other (See Comments)    Unknown    Lisinopril  Cough    Dry hacking cough and tickle    Review of Systems  Constitutional:  Negative for chills and fever.  HENT:  Negative for congestion, sinus pressure, sinus pain and sore throat.   Eyes:  Negative for pain and discharge.  Respiratory:  Negative for cough and shortness of breath.   Cardiovascular:  Negative for chest pain and palpitations.  Gastrointestinal:  Positive for abdominal pain. Negative for diarrhea, nausea and vomiting.  Endocrine: Negative for polydipsia and polyuria.  Genitourinary:  Negative for dysuria and hematuria.  Musculoskeletal:  Negative for neck pain and neck stiffness.  Skin:  Negative for rash.  Neurological:  Negative for dizziness and weakness.   Psychiatric/Behavioral:  Negative for agitation and behavioral problems.        Objective:    Physical Exam Vitals reviewed.  Constitutional:      General: She is not in acute distress.    Appearance: She is obese. She is not diaphoretic.  HENT:     Head: Normocephalic and atraumatic.     Nose: No congestion.     Mouth/Throat:     Mouth: Mucous membranes are moist.     Pharynx: No posterior oropharyngeal erythema.  Eyes:     General: No scleral icterus.    Extraocular Movements: Extraocular movements intact.  Neck:     Vascular: No carotid bruit.  Cardiovascular:     Rate and Rhythm: Normal rate and regular rhythm.     Heart sounds: Normal heart sounds. No murmur heard. Pulmonary:  Breath sounds: Normal breath sounds. No wheezing or rales.  Abdominal:     Palpations: Abdomen is soft.     Tenderness: There is no abdominal tenderness.  Musculoskeletal:     Cervical back: Neck supple. No tenderness.     Right lower leg: No edema.     Left lower leg: No edema.  Skin:    General: Skin is warm.     Findings: Lesion (Brownish papule over left side of face and left upper arm - likely actinic keratoses) present. No rash.  Neurological:     General: No focal deficit present.     Mental Status: She is alert and oriented to person, place, and time.     Sensory: No sensory deficit.     Motor: No weakness.  Psychiatric:        Mood and Affect: Mood normal.        Behavior: Behavior normal.     BP 124/80 (BP Location: Left Arm)   Pulse 87   Ht 5' 5.5 (1.664 m)   Wt 211 lb 6.4 oz (95.9 kg)   SpO2 95%   BMI 34.64 kg/m  Wt Readings from Last 3 Encounters:  03/27/24 211 lb 6.4 oz (95.9 kg)  03/24/24 210 lb (95.3 kg)  03/23/24 214 lb 9.6 oz (97.3 kg)        Assessment & Plan:   Problem List Items Addressed This Visit       Cardiovascular and Mediastinum   Essential hypertension   BP Readings from Last 1 Encounters:  03/27/24 124/80   Well-controlled with  Metoprolol  (for CHF) Counseled for compliance with the medications Advised DASH diet and moderate exercise/walking as tolerated        Endocrine   Type 2 diabetes mellitus with other specified complication (HCC)   Lab Results  Component Value Date   HGBA1C 5.9 (H) 12/26/2023   Due to recent concern of hyperglycemia, checked POC HbA1c: 6.3  Well-controlled Associated with HTN, HLD and neuropathy On glipizide  5 mg QD, Farxiga  10 mg QD and Ozempic  1 mg qw Due to tightly controlled DM, had DCed Glipizide  Advised to follow diabetic diet On statin F/u CMP and lipid panel Diabetic eye exam: Advised to follow up with Ophthalmology for diabetic eye exam  Has diabetic neuropathy, intermittent numbness of feet - has diabetic shoes      Relevant Orders   Bayer DCA Hb A1c Waived     Genitourinary   Recurrent UTI   Recent episode of hematuria likely due to DAPT Considering her dysuria, she was given Keflex  from the ER She has mild abdominal discomfort from Keflex , but overall tolerating it well Check urine culture as the initial test from the ER was indeterminate Advised to maintain at least 64 ounces of fluid intake in a day      Relevant Orders   Urine Culture     Other   Encounter for examination following treatment at hospital - Primary   ER chart reviewed, including urine tests and recent CT Head Advised to complete course of Keflex  for UTI       Transient visual disturbance, right   Was evaluated in ER, followed by neurology and ophthalmology evaluation CT head was negative for acute CVA, but 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    Suzzane MARLA Blanch, MD

## 2024-03-27 NOTE — Assessment & Plan Note (Signed)
 BP Readings from Last 1 Encounters:  03/27/24 124/80   Well-controlled with Metoprolol  (for CHF) Counseled for compliance with the medications Advised DASH diet and moderate exercise/walking as tolerated

## 2024-03-27 NOTE — Patient Instructions (Signed)
 Please complete the course of Cephalexin .  Please stop taking Clopidogrel . Please continue taking Aspirin  81 mg once daily.

## 2024-03-27 NOTE — Assessment & Plan Note (Signed)
 Lab Results  Component Value Date   HGBA1C 5.9 (H) 12/26/2023   Due to recent concern of hyperglycemia, checked POC HbA1c: 6.3  Well-controlled Associated with HTN, HLD and neuropathy On glipizide  5 mg QD, Farxiga  10 mg QD and Ozempic  1 mg qw Due to tightly controlled DM, had DCed Glipizide  Advised to follow diabetic diet On statin F/u CMP and lipid panel Diabetic eye exam: Advised to follow up with Ophthalmology for diabetic eye exam  Has diabetic neuropathy, intermittent numbness of feet - has diabetic shoes

## 2024-03-28 ENCOUNTER — Encounter (HOSPITAL_COMMUNITY)

## 2024-03-28 NOTE — Progress Notes (Signed)
 Remote PPM Transmission

## 2024-03-29 ENCOUNTER — Ambulatory Visit: Payer: Self-pay | Admitting: Internal Medicine

## 2024-03-29 LAB — URINE CULTURE: Organism ID, Bacteria: NO GROWTH

## 2024-03-30 ENCOUNTER — Encounter (HOSPITAL_COMMUNITY)

## 2024-03-31 LAB — BAYER DCA HB A1C WAIVED: HB A1C (BAYER DCA - WAIVED): 6.3 % — ABNORMAL HIGH (ref 4.8–5.6)

## 2024-04-02 ENCOUNTER — Encounter (HOSPITAL_COMMUNITY): Admission: RE | Admit: 2024-04-02 | Discharge: 2024-04-02 | Attending: Cardiology

## 2024-04-02 ENCOUNTER — Encounter (HOSPITAL_COMMUNITY)

## 2024-04-02 DIAGNOSIS — R0609 Other forms of dyspnea: Secondary | ICD-10-CM | POA: Diagnosis present

## 2024-04-02 DIAGNOSIS — I5032 Chronic diastolic (congestive) heart failure: Secondary | ICD-10-CM

## 2024-04-02 NOTE — Progress Notes (Signed)
 Daily Session Note  Patient Details  Name: Julie Clay MRN: [id number] Date of Birth: 04-22-1940 Referring Provider:   Flowsheet Row PULMONARY REHAB OTHER RESP ORIENTATION from 02/20/2024 in [facility] CARDIAC REHABILITATION  Referring Provider Debera Savant MD    Encounter Date: 04/02/2024  Check In:  Session Check In - 04/02/24 1432       Check-In   Supervising physician immediately available to respond to emergencies See telemetry face sheet for immediately available MD    Location [facility]    Staff Present Powell Benders, BS, Exercise Physiologist;Sache Sane Zina, RN    Virtual Visit No    Medication changes reported     No    Fall or balance concerns reported    No    Warm-up and Cool-down Performed on first and last piece of equipment    Resistance Training Performed Yes    VAD Patient? No    PAD/SET Patient? No      Pain Assessment   Currently in Pain? No/denies          Capillary Blood Glucose: No results found. However, due to the size of the patient record, not all encounters were searched. Please check Results Review for a complete set of results.    Tobacco Use History[1]  Goals Met:  Proper associated with RPD/PD & O2 Sat Independence with exercise equipment Using PLB without cueing & demonstrates good technique Exercise tolerated well No report of concerns or symptoms today Strength training completed today  Goals Unmet:  Not Applicable  Comments: Pt able to follow exercise prescription today without complaint.  Will continue to monitor for progression.        [1]  Social History Tobacco Use  Smoking Status Never  Smokeless Tobacco Never

## 2024-04-04 ENCOUNTER — Encounter (HOSPITAL_COMMUNITY): Admission: RE | Admit: 2024-04-04 | Discharge: 2024-04-04 | Attending: Cardiology

## 2024-04-04 ENCOUNTER — Encounter (HOSPITAL_COMMUNITY)

## 2024-04-04 DIAGNOSIS — I5032 Chronic diastolic (congestive) heart failure: Secondary | ICD-10-CM

## 2024-04-04 DIAGNOSIS — R0609 Other forms of dyspnea: Secondary | ICD-10-CM | POA: Diagnosis not present

## 2024-04-04 NOTE — Telephone Encounter (Signed)
 Received notes from [facility] Ophthalmology (visit date 03/26/24). Note placed in Dr Bucky office for review.

## 2024-04-04 NOTE — Progress Notes (Signed)
 Daily Session Note  Patient Details  Name: Julie Clay MRN: [id number] Date of Birth: 02-19-1941 Referring Provider:   Flowsheet Row PULMONARY REHAB OTHER RESP ORIENTATION from 02/20/2024 in [facility] CARDIAC REHABILITATION  Referring Provider Debera Savant MD    Encounter Date: 04/04/2024  Check In:  Session Check In - 04/04/24 1421       Check-In   Supervising physician immediately available to respond to emergencies See telemetry face sheet for immediately available MD    Location [facility]    Staff Present Laymon Rattler, BSN, RN, Rosalba Gelineau, MA, RCEP, CCRP, CCET;Hillary [employer], RN    Virtual Visit No    Medication changes reported     No    Fall or balance concerns reported    No    Tobacco Cessation No Change    Warm-up and Cool-down Performed on first and last piece of equipment    Resistance Training Performed Yes    VAD Patient? No    PAD/SET Patient? No      Pain Assessment   Currently in Pain? No/denies          Capillary Blood Glucose: No results found. However, due to the size of the patient record, not all encounters were searched. Please check Results Review for a complete set of results.    Tobacco Use History[1]  Goals Met:  Proper associated with RPD/PD & O2 Sat Independence with exercise equipment Improved SOB with ADL's Using PLB without cueing & demonstrates good technique Exercise tolerated well No report of concerns or symptoms today Strength training completed today  Goals Unmet:  Not Applicable  Comments: Pt able to follow exercise prescription today without complaint.  Will continue to monitor for progression.        [1]  Social History Tobacco Use  Smoking Status Never  Smokeless Tobacco Never

## 2024-04-06 ENCOUNTER — Encounter (HOSPITAL_COMMUNITY): Admission: RE | Admit: 2024-04-06 | Discharge: 2024-04-06 | Attending: Cardiology

## 2024-04-06 ENCOUNTER — Encounter (HOSPITAL_COMMUNITY)

## 2024-04-06 DIAGNOSIS — I5032 Chronic diastolic (congestive) heart failure: Secondary | ICD-10-CM

## 2024-04-06 DIAGNOSIS — R0609 Other forms of dyspnea: Secondary | ICD-10-CM

## 2024-04-06 NOTE — Progress Notes (Signed)
 Daily Session Note  Patient Details  Name: Julie Clay MRN: [id number] Date of Birth: [birth year] Referring Provider:   Flowsheet Row PULMONARY REHAB OTHER RESP ORIENTATION from 02/20/2024 in [facility] CARDIAC REHABILITATION  Referring Provider Debera Savant MD    Encounter Date: 04/06/2024  Check In:  Session Check In - 04/06/24 1255       Check-In   Supervising physician immediately available to respond to emergencies See telemetry face sheet for immediately available MD    Location [facility]    Staff Present Laymon Rattler, BSN, RN, WTA-C;Victoria Zina, RN    Virtual Visit No    Medication changes reported     No    Fall or balance concerns reported    No    Tobacco Cessation No Change    Warm-up and Cool-down Performed on first and last piece of equipment    Resistance Training Performed Yes    VAD Patient? No    PAD/SET Patient? No      Pain Assessment   Currently in Pain? No/denies          Capillary Blood Glucose: No results found. However, due to the size of the patient record, not all encounters were searched. Please check Results Review for a complete set of results.    Tobacco Use History[1]  Goals Met:  Proper associated with RPD/PD & O2 Sat Independence with exercise equipment Improved SOB with ADL's Using PLB without cueing & demonstrates good technique Exercise tolerated well  Goals Unmet:  Not Applicable  Comments: Pt able to follow exercise prescription today without complaint.  Will continue to monitor for progression.        [1]  Social History Tobacco Use  Smoking Status Never  Smokeless Tobacco Never

## 2024-04-09 ENCOUNTER — Encounter (HOSPITAL_COMMUNITY)

## 2024-04-11 ENCOUNTER — Encounter (HOSPITAL_COMMUNITY)
Admission: RE | Admit: 2024-04-11 | Discharge: 2024-04-11 | Disposition: A | Source: Ambulatory Visit | Attending: Cardiology

## 2024-04-11 ENCOUNTER — Encounter (HOSPITAL_COMMUNITY)

## 2024-04-11 DIAGNOSIS — R0609 Other forms of dyspnea: Secondary | ICD-10-CM

## 2024-04-11 DIAGNOSIS — I5032 Chronic diastolic (congestive) heart failure: Secondary | ICD-10-CM

## 2024-04-11 NOTE — Progress Notes (Signed)
 Daily Session Note  Patient Details  Name: Julie Clay MRN: [id number] Date of Birth: 12-29-1940 Referring Provider:   Flowsheet Row PULMONARY REHAB OTHER RESP ORIENTATION from 02/20/2024 in [facility] CARDIAC REHABILITATION  Referring Provider Debera Savant MD    Encounter Date: 04/11/2024  Check In:  Session Check In - 04/11/24 1100       Check-In   Supervising physician immediately available to respond to emergencies See telemetry face sheet for immediately available MD    Location [facility]    Staff Present Adrien Louder, RN, BSN;Heather Con, BS, Exercise Physiologist;Jessica Vonzell, MA, RCEP, CCRP, CCET    Virtual Visit No    Medication changes reported     No    Fall or balance concerns reported    No    Warm-up and Cool-down Performed on first and last piece of equipment    Resistance Training Performed Yes    VAD Patient? No    PAD/SET Patient? No      Pain Assessment   Currently in Pain? No/denies    Pain Score 0-No pain    Multiple Pain Sites No          Capillary Blood Glucose: No results found. However, due to the size of the patient record, not all encounters were searched. Please check Results Review for a complete set of results.    Tobacco Use History[1]  Goals Met:  Proper associated with RPD/PD & O2 Sat Independence with exercise equipment Using PLB without cueing & demonstrates good technique Exercise tolerated well No report of concerns or symptoms today Strength training completed today  Goals Unmet:  Not Applicable  Comments: Pt able to follow exercise prescription today without complaint.  Will continue to monitor for progression.        [1]  Social History Tobacco Use  Smoking Status Never  Smokeless Tobacco Never

## 2024-04-13 ENCOUNTER — Encounter (HOSPITAL_COMMUNITY)

## 2024-04-16 ENCOUNTER — Ambulatory Visit
Admission: EM | Admit: 2024-04-16 | Discharge: 2024-04-16 | Disposition: A | Discharge status: Home / Self Care | Attending: Nurse Practitioner | Admitting: Nurse Practitioner

## 2024-04-16 ENCOUNTER — Encounter (HOSPITAL_COMMUNITY)

## 2024-04-16 DIAGNOSIS — R059 Cough, unspecified: Secondary | ICD-10-CM

## 2024-04-16 DIAGNOSIS — U071 COVID-19: Secondary | ICD-10-CM

## 2024-04-16 LAB — POC COVID19/FLU A&B COMBO
Covid Antigen, POC: POSITIVE — AB
Influenza A Antigen, POC: NEGATIVE
Influenza B Antigen, POC: NEGATIVE

## 2024-04-16 MED ORDER — PAXLOVID (300/100) 20 X 150 MG & 10 X 100MG PO TBPK: 3.0000 | ORAL_TABLET | Freq: Two times a day (BID) | ORAL | 0 refills | Status: AC

## 2024-04-16 NOTE — ED Triage Notes (Signed)
 Pt reports cough,nasal congestion, headache, body aches hx of bronchitis, sx's x 2 days

## 2024-04-16 NOTE — Discharge Instructions (Signed)
 The COVID/flu test was positive for COVID today. Take medication as prescribed.  You will need to hold your rosuvastatin  or Crestor  for 2 weeks.  You may continue the over-the-counter cough and cold medication along with use of your nasal spray at home. Increase fluids and allow for plenty of rest. You may take over-the-counter Tylenol  as needed for pain, fever, or general discomfort. Recommend the use of normal saline nasal spray throughout the day for nasal congestion or runny nose. For your cough, you may find it helpful to use a humidifier in your bedroom at nighttime during sleep and sleeping elevated on pillows while symptoms persist. Go to the emergency department if you experience worsening cough, shortness of breath, difficulty breathing, or other concerns. Follow-up with your primary care physician if symptoms fail to improve. Follow-up as needed.

## 2024-04-16 NOTE — ED Provider Notes (Signed)
 " [facility]    CSN: [id number] Arrival date & time: 04/16/24  1210      History   Chief Complaint No chief complaint on file.   HPI Julie Clay is a 83 y.o. female.   The history is provided by the patient.   Patient presents with a 2-day history of head congestion, nasal congestion, cough, body aches, and headaches.  Patient denies fever, chills, ear pain, ear drainage, wheezing, difficulty breathing, abdominal pain, nausea, vomiting, diarrhea, or rash.  Patient states that she does have underlying history of bronchitis.  Per review of her chart, she also has a history of COPD.  States she has been taking over-the-counter medications for her symptoms.  Past Medical History:  Diagnosis Date   2nd degree AV block 06/22/2017   Arthritis    Asthma    Bone spur    Breast cancer, left breast (HCC)    2012   Breast cancer, right breast (HCC)    2021   Chronic bronchitis (HCC)    COPD (chronic obstructive pulmonary disease) (HCC)    Diabetes mellitus without complication (HCC)    Phreesia 04/08/2020   Diastolic heart failure (HCC)    Essential hypertension    Fatty liver 2013   GERD (gastroesophageal reflux disease)    HSV (herpes simplex virus) infection    Hyperlipidemia    IBS (irritable bowel syndrome)    Lung nodule seen on imaging study 2013   Lymphedema    Left arm   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   Mixed simple and mucopurulent chronic bronchitis (HCC) 09/03/2022   Encounter for examination following  treatment at hospital 09/03/2022   Encounter for general adult medical examination with abnormal findings 06/25/2022   Acute non-recurrent maxillary sinusitis 06/25/2022   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   Poison ivy dermatitis 06/25/2022   Intertrigo 06/25/2022   Muscle strain 03/29/2022   Actinic keratoses 12/23/2021   IBS (irritable bowel syndrome) 09/01/2021   Gastroesophageal reflux disease 05/20/2021   (HFpEF) heart failure with preserved ejection fraction (HCC) 05/20/2021   GAD (generalized anxiety disorder) 05/20/2021   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 (HCC)    Pacemaker 07/13/2017   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/2017   Primary osteoarthritis of first carpometacarpal joint of left hand 06/12/2015   Fatty liver disease, nonalcoholic 05/23/2015   Lymphedema 02/18/2015   Ganglion cyst of flexor tendon sheath of finger of left hand 08/02/2014   Atypical ductal hyperplasia of left breast 07/12/2013   Endometrial polyp 06/25/2013   Diabetic neuropathy (HCC) 05/09/2013   Bladder prolapse, female, acquired 05/09/2013   Pain, joint, multiple sites 03/26/2013   Insomnia 11/29/2012   Rotator cuff syndrome of left shoulder 06/21/2012   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   Essential hypertension 10/11/2011   Type 2 diabetes mellitus with other specified complication (HCC) 10/11/2011   OA (osteoarthritis) 10/11/2011  Class 2 obesity 10/11/2011   Asthma with acute exacerbation 10/11/2011   Hyperlipidemia 10/11/2011   Chronic fatigue 10/11/2011   OSA (obstructive sleep apnea) 10/11/2011    Past Surgical History:  Procedure Laterality Date   BREAST BIOPSY Left 04/2011   BREAST BIOPSY Left 06/22/13   BREAST BIOPSY Left 09/12/2013   Procedure: BREAST BIOPSY WITH NEEDLE LOCALIZATION;  Surgeon: Oneil DELENA Budge, MD;  Location: [facility];  Service: General;  Laterality: Left;   BREAST LUMPECTOMY Left 04/2011 X 2    BREAST SURGERY N/A    Phreesia 01/17/2020   CATARACT EXTRACTION W/ INTRAOCULAR LENS  IMPLANT, BILATERAL Bilateral    CATARACT EXTRACTION, BILATERAL     on different occassions    CESAREAN SECTION  1973   COLON SURGERY N/A    2014   DILATION AND CURETTAGE OF UTERUS  11/06/2013   HAND LIGAMENT RECONSTRUCTION Right ~ 2010   HYSTEROSCOPY WITH D & C N/A 11/06/2013   Procedure: DILATATION AND CURETTAGE /HYSTEROSCOPY;  Surgeon: Norleen Edsel GAILS, MD;  Location: [facility];  Service: Gynecology;  Laterality: N/A;   INSERT / REPLACE / REMOVE PACEMAKER  06/22/2017   LUNG LOBECTOMY Right 1988   Fungal Infection   PACEMAKER IMPLANT N/A 06/22/2017   Procedure: PACEMAKER IMPLANT;  Surgeon: Fernande Elspeth BROCKS, MD;  Location: [facility];  Service: Cardiovascular;  Laterality: N/A;   PARTIAL MASTECTOMY WITH NEEDLE LOCALIZATION Right 01/02/2020   Procedure: RIGHT PARTIAL MASTECTOMY AFTER NEEDLE LOCALIZATION;  Surgeon: Budge Oneil, MD;  Location: [facility];  Service: General;  Laterality: Right;   POLYPECTOMY N/A 11/06/2013   Procedure: POLYPECTOMY (REMOVAL ENDOMETRIAL POLYP);  Surgeon: Norleen Edsel GAILS, MD;  Location: [facility];  Service: Gynecology;  Laterality: N/A;   RECTOCELE REPAIR N/A 11/06/2013   Procedure: POSTERIOR REPAIR (RECTOCELE);  Surgeon: Norleen Edsel GAILS, MD;  Location: [facility];  Service: Gynecology;  Laterality: N/A;   SHOULDER ARTHROSCOPY WITH OPEN ROTATOR CUFF REPAIR Left 07/26/2012   TONSILLECTOMY  1949   TUBAL LIGATION      OB History     Gravida  2   Para  2   Term      Preterm      AB      Living  2      SAB      IAB      Ectopic      Multiple      Live Births  2            Home Medications    Prior to Admission medications  Medication Sig Start Date End Date Taking? Authorizing Provider  acetaminophen  (TYLENOL ) 500 MG tablet Take 1,000 mg by mouth every 6 (six) hours as needed for moderate pain.    [provider]  albuterol  (VENTOLIN  HFA) 108 (90 Base)  MCG/ACT inhaler Inhale 2 puffs into the lungs every 6 (six) hours as needed for wheezing or shortness of breath. 04/09/20   Bari Theodoro FALCON, MD  Aloe Vera Leaf  POWD Take 800 mg by mouth 2 (two) times daily.    [provider]  Ascorbic Acid  (VITAMIN C ) 1000 MG tablet Take 1,000 mg by mouth 2 (two) times daily.    [provider]  aspirin  EC 81 MG tablet Take 81 mg by mouth daily. Swallow whole.    [provider]  b complex vitamins tablet Take 1 tablet by mouth 2 (two) times daily.     [provider]  Blood Glucose Monitoring Suppl (  BLOOD GLUCOSE SYSTEM PAK) KIT Use as directed to monitor FSBS 1x daily. Dx: E11.9. Please dispense as Accu-Chek Aviva 01/18/18   [place], Theodoro FALCON, MD  Calcium  Polycarbophil (FIBER-CAPS PO) Take 2-3 capsules by mouth in the morning and at bedtime.    [provider]  cephALEXin  (KEFLEX ) 500 MG capsule Take 1 capsule (500 mg total) by mouth 4 (four) times daily. 03/24/24   Odell Balls, PA-C  Cholecalciferol  (VITAMIN D3) 2000 UNITS TABS Take 2,000 Units by mouth 2 (two) times daily.    [provider]  clotrimazole -betamethasone  (LOTRISONE ) cream Apply 1 Application topically daily. Patient taking differently: Apply 1 Application topically daily as needed (groin rash). 01/03/24   Tobie Suzzane POUR, MD  dapagliflozin  propanediol (FARXIGA ) 10 MG TABS tablet Take 1 tablet (10 mg total) by mouth daily before breakfast. 06/28/23   Patel, Rutwik K, MD  fluocinonide  cream (LIDEX ) 0.05 % Apply 1 Application topically 2 (two) times daily as needed (poison ivy/bites/hives). 06/25/22   Tobie Suzzane POUR, MD  Ginkgo Biloba 120 MG CAPS Take 120 mg by mouth in the morning and at bedtime.     [provider]  Glucose Blood (BLOOD GLUCOSE TEST STRIPS) STRP Use as directed to monitor FSBS 1x daily. Dx: E11.9. Please dispense as Accu-Chek Aviva 01/18/18   [place], Theodoro FALCON, MD  KRILL OIL PO Take 1,500 mg by mouth 2 (two) times daily.     [provider]  Lancets MISC Use as directed to monitor FSBS 1x daily. Dx: E11.9. Please dispense as Accu-Chek Aviva 01/18/18   [place], Theodoro FALCON, MD  metoprolol  succinate (TOPROL -XL) 25 MG 24 hr tablet Take 1/2 (one-half) tablet by mouth once daily Patient taking differently: Take 12.5 mg by mouth daily. 06/16/23   Debera Jayson MATSU, MD  Multiple Vitamin (MULTIVITAMIN WITH MINERALS) TABS tablet Take 1 tablet by mouth daily.    [provider]  naftifine  (NAFTIN ) 1 % cream Apply topically 2 (two) times daily as needed. Patient taking differently: Apply 1 Application topically 2 (two) times daily as needed (between toes itching). 12/04/19   [place], Theodoro FALCON, MD  olopatadine (PATADAY) 0.1 % ophthalmic solution Place 1 drop into both eyes daily as needed for allergies.    [provider]  Probiotic Product (PROBIOTIC ADVANCED PO) Take 1 capsule by mouth daily.    [provider]  psyllium (REGULOID) 0.52 g capsule Take 2-3 capsules by mouth in the morning and at bedtime. 3 caps am, 2 caps pm    [provider]  rosuvastatin  (CRESTOR ) 5 MG tablet Take 1 tablet (5 mg total) by mouth daily. for cholesterol. 01/03/24   Tobie Suzzane POUR, MD  Semaglutide , 1 MG/DOSE, (OZEMPIC , 1 MG/DOSE,) 4 MG/3ML SOPN Inject 1 mg into the skin once a week. 01/03/24   Tobie Suzzane POUR, MD  simethicone  (MYLICON) 80 MG chewable tablet Chew 80 mg by mouth as needed for flatulence.     [provider]  tiZANidine  (ZANAFLEX ) 4 MG tablet TAKE 1 TABLET BY MOUTH EVERY 8 HOURS AS NEEDED FOR MUSCLE SPASM Patient taking differently: Take 2 mg by mouth at bedtime. 02/06/24   Tobie Suzzane POUR, MD    Family History Family History  Problem Relation Age of Onset   Hypertension Mother    Heart disease Mother    Diabetes Mother    Kidney disease Mother        ESRD   Cancer Mother        Bladder   Hypertension  Father    Hyperlipidemia Father    Heart disease Father    Diabetes  Father    Cancer Father        Stomach Cancer   Diabetes Sister    Heart disease Sister    Alzheimer's disease Paternal Aunt    Stroke Maternal Grandfather    Cancer Paternal Grandfather        stomach   Cancer Maternal Uncle        prostate   Cancer Paternal Uncle        stomach    Social History Social History[1]   Allergies   Adhesive [tape], Arimidex  [anastrozole ], Other, Tetanus toxoid-containing vaccines, Tetracyclines & related, and Lisinopril    Review of Systems Review of Systems Per HPI  Physical Exam Triage Vital Signs ED Triage Vitals  Encounter Vitals Group     BP 04/16/24 1302 (!) 147/94     Girls Systolic BP Percentile --      Girls Diastolic BP Percentile --      Boys Systolic BP Percentile --      Boys Diastolic BP Percentile --      Pulse Rate 04/16/24 1302 78     Resp 04/16/24 1302 16     Temp 04/16/24 1302 99.4 F (37.4 C)     Temp Source 04/16/24 1302 Oral     SpO2 04/16/24 1302 93 %     Weight --      Height --      Head Circumference --      Peak Flow --      Pain Score 04/16/24 1303 0     Pain Loc --      Pain Education --      Exclude from Growth Chart --    No data found.  Updated Vital Signs BP (!) 147/94 (BP Location: Right Arm)   Pulse 78   Temp 99.4 F (37.4 C) (Oral)   Resp 16   SpO2 93%   Visual Acuity Right Eye Distance:   Left Eye Distance:   Bilateral Distance:    Right Eye Near:   Left Eye Near:    Bilateral Near:     Physical Exam Vitals and nursing note reviewed.  Constitutional:      General: She is not in acute distress.    Appearance: Normal appearance. She is well-developed.  HENT:     Head: Normocephalic and atraumatic.     Right Ear: Tympanic membrane, ear canal and external ear normal.     Left Ear: Tympanic membrane, ear canal and external ear normal.     Nose: Congestion present.     Right Turbinates: Enlarged and swollen.     Left Turbinates: Enlarged and swollen.     Right Sinus: No  maxillary sinus tenderness or frontal sinus tenderness.     Left Sinus: No maxillary sinus tenderness or frontal sinus tenderness.     Mouth/Throat:     Lips: Pink.     Mouth: Mucous membranes are moist.     Pharynx: Uvula midline. Postnasal drip present. No pharyngeal swelling, oropharyngeal exudate, posterior oropharyngeal erythema or uvula swelling.     Comments: Cobblestoning present to posterior oropharynx  Eyes:     Extraocular Movements: Extraocular movements intact.     Conjunctiva/sclera: Conjunctivae normal.     Pupils: Pupils are equal, round, and reactive to light.  Neck:     Thyroid : No thyromegaly.     Trachea: No tracheal deviation.  Cardiovascular:  Rate and Rhythm: Normal rate and regular rhythm.     Pulses: Normal pulses.     Heart sounds: Normal heart sounds.  Pulmonary:     Effort: Pulmonary effort is normal. No respiratory distress.     Breath sounds: Normal breath sounds. No stridor. No wheezing, rhonchi or rales.  Abdominal:     General: Bowel sounds are normal.     Palpations: Abdomen is soft.     Tenderness: There is no abdominal tenderness.  Musculoskeletal:     Cervical back: Normal range of motion and neck supple.  Skin:    General: Skin is warm and dry.  Neurological:     General: No focal deficit present.     Mental Status: She is alert and oriented to person, place, and time.  Psychiatric:        Mood and Affect: Mood normal.        Behavior: Behavior normal.        Thought Content: Thought content normal.        Judgment: Judgment normal.      UC Treatments / Results  Labs (all labs ordered are listed, but only abnormal results are displayed) Labs Reviewed  POC COVID19/FLU A&B COMBO - Abnormal; Notable for the following components:      Result Value   Covid Antigen, POC Positive (*)    All other components within normal limits    EKG   Radiology No results found.  Procedures Procedures (including critical care  time)  Medications Ordered in UC Medications - No data to display  Initial Impression / Assessment and Plan / UC Course  I have reviewed the triage vital signs and the nursing notes.  Pertinent labs & imaging results that were available during my care of the patient were reviewed by me and considered in my medical decision making (see chart for details).  Flu/COVID test was positive for COVID.  Patient has elected to begin Tamiflu.  Patient denies use of blood thinning medications, or kidney disease.  Patient is currently taking Crestor , will have patient hold medication for 2 weeks.  Per review of the patient's chart, most recent GFR is greater than 60 per lab results dated 02/27/2024.  Will start patient on Paxlovid .  Supportive care recommendations were provided discussed with the patient to include fluids, rest, over-the-counter analgesics, warm salt water gargles, and use of a humidifier during sleep.  Discussed indications with the patient regarding follow-up along with strict ER follow-up precautions.  Patient was in agreement with this plan of care and verbalizes understanding.  All questions were answered.  Patient stable for discharge.   Final Clinical Impressions(s) / UC Diagnoses   Final diagnoses:  Cough, unspecified type   Discharge Instructions   None    ED Prescriptions   None    PDMP not reviewed this encounter.     [1]  Social History Tobacco Use   Smoking status: Never   Smokeless tobacco: Never  Vaping Use   Vaping status: Never Used  Substance Use Topics   Alcohol use: Not Currently    Comment: Twice a year   Drug use: No     Gilmer Etta PARAS, NP 04/16/24 1410  "

## 2024-04-17 ENCOUNTER — Encounter (HOSPITAL_COMMUNITY): Payer: Self-pay | Admitting: *Deleted

## 2024-04-17 DIAGNOSIS — R0609 Other forms of dyspnea: Secondary | ICD-10-CM

## 2024-04-17 DIAGNOSIS — I5032 Chronic diastolic (congestive) heart failure: Secondary | ICD-10-CM

## 2024-04-17 NOTE — Progress Notes (Signed)
 Pulmonary Individual Treatment Plan  Patient Details  Name: Julie Clay MRN: [id number] Date of Birth: [birth year] Referring Provider:   Flowsheet Row PULMONARY REHAB OTHER RESP ORIENTATION from 02/20/2024 in [facility] CARDIAC REHABILITATION  Referring Provider Debera Savant MD    Initial Encounter Date:  Flowsheet Row PULMONARY REHAB OTHER RESP ORIENTATION from 02/20/2024 in [facility]  Date 02/20/24    Visit Diagnosis: DOE (dyspnea on exertion)  Chronic diastolic heart failure (HCC)  Patient's Home Medications on Admission:  Current Medications[1]  Past Medical History: Past Medical History:  Diagnosis Date   2nd degree AV block 06/22/2017   Arthritis    Asthma    Bone spur    Breast cancer, left breast (HCC)    2012   Breast cancer, right breast (HCC)    2021   Chronic bronchitis (HCC)    COPD (chronic obstructive pulmonary disease) (HCC)    Diabetes mellitus without complication (HCC)    [employer]reesia 04/08/2020   Diastolic heart failure (HCC)    Essential hypertension    Fatty liver 2013   GERD (gastroesophageal reflux disease)    HSV (herpes simplex virus) infection    Hyperlipidemia    IBS (irritable bowel syndrome)    Lung nodule seen on imaging study 2013   Lymphedema    Left arm   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    Osteoarthritis    both knees, lower back, both shoulders; bone spurs to feet   Rectocele 06/25/2013   Posterior repair 11/06/13    Type II diabetes mellitus (HCC)    Ventral hernia     Tobacco Use: Tobacco Use History[2]  Labs: Review Flowsheet  More data exists      Latest Ref Rng & Units 06/18/2022 12/24/2022 06/28/2023 12/26/2023 03/26/2024  Labs for ITP Cardiac and Pulmonary Rehab  Cholestrol 100 - 199 mg/dL 829  - 855  851  -  LDL (calc) 0 - 99 mg/dL 78  - 62  71  -  HDL-C >39 mg/dL 47  - 44  48  -  Trlycerides 0 - 149 mg/dL 719  - 762  829  -  Hemoglobin A1c 4.8 - 5.6 % 6.5  6.4  6.0  5.9  6.3      Capillary Blood Glucose: Lab Results  Component Value Date   GLUCAP 89 03/05/2024   GLUCAP 110 (H) 03/05/2024   GLUCAP 87 02/24/2024   GLUCAP 92 02/24/2024   GLUCAP 83 02/22/2024     Pulmonary Assessment Scores:  Pulmonary Assessment Scores     Row Name 02/20/24 1409         ADL UCSD   ADL Phase Entry     SOB Score total 77     Rest 0     Walk 2     Stairs 5     Bath 2     Dress 3     Shop 5       CAT Score   CAT Score 17       mMRC Score   mMRC Score 3       UCSD: Self-administered rating of dyspnea associated with activities of daily living (ADLs) 6-point scale (0 = not at all to 5 = maximal or unable to do because of breathlessness)  Scoring Scores range from 0 to 120.  Minimally important difference is 5 units  CAT: CAT can identify the health impairment of COPD patients and is  better correlated with disease progression.  CAT has a scoring range of zero to 40. The CAT score is classified into four groups of low (less than 10), medium (10 - 20), high (21-30) and very high (31-40) based on the impact level of disease on health status. A CAT score over 10 suggests significant symptoms.  A worsening CAT score could be explained by an exacerbation, poor medication adherence, poor inhaler technique, or progression of COPD or comorbid conditions.  CAT MCID is 2 points  mMRC: mMRC (Modified Medical Research Council) Dyspnea Scale is used to assess the degree of baseline functional disability in patients of respiratory disease due to dyspnea. No minimal important difference is established. A decrease in score of 1 point or greater is considered a positive change.   Pulmonary Function Assessment:   Exercise Target Goals: Exercise Program Goal: Individual exercise prescription set using results from initial 6 min walk test and THRR while considering  patients activity barriers and safety.   Exercise Prescription Goal: Initial exercise prescription builds  to 30-45 minutes a day of aerobic activity, 2-3 days per week.  Home exercise guidelines will be given to patient during program as part of exercise prescription that the participant will acknowledge.  Activity Barriers & Risk Stratification:  Activity Barriers & Cardiac Risk Stratification - 02/20/24 1315       Activity Barriers & Cardiac Risk Stratification   Activity Barriers Arthritis;Back Problems;Shortness of Breath;Deconditioning;Other (comment)    Comments spurs in both heels and knees          6 Minute Walk:  6 Minute Walk     Row Name 02/20/24 1412         6 Minute Walk   Phase Initial     Distance 750 feet     Walk Time 6 minutes     # of Rest Breaks 1     MPH 1.42     METS 1.04     RPE 13     Perceived Dyspnea  2     VO2 Peak 3.67     Symptoms Yes (comment)     Comments SOB and BLE pain L knee     Resting HR 70 bpm     Resting BP 120/64     Resting Oxygen Saturation  96 %     Exercise Oxygen Saturation  during 6 min walk 91 %     Max Ex. HR 117 bpm     Max Ex. BP 136/64     2 Minute Post BP 122/62       Interval HR   1 Minute HR 102     2 Minute HR 117     3 Minute HR 110     4 Minute HR 106     5 Minute HR 77     6 Minute HR 110     2 Minute Post HR 78     Interval Heart Rate? Yes       Interval Oxygen   Interval Oxygen? Yes     Baseline Oxygen Saturation % 96 %     1 Minute Oxygen Saturation % 91 %     1 Minute Liters of Oxygen 0 L     2 Minute Oxygen Saturation % 94 %     2 Minute Liters of Oxygen 0 L     3 Minute Oxygen Saturation % 95 %     3 Minute Liters of Oxygen 0 L  4 Minute Oxygen Saturation % 96 %     4 Minute Liters of Oxygen 0 L     5 Minute Oxygen Saturation % 95 %     5 Minute Liters of Oxygen 0 L     6 Minute Oxygen Saturation % 95 %     6 Minute Liters of Oxygen 0 L     2 Minute Post Oxygen Saturation % 96 %     2 Minute Post Liters of Oxygen 0 L        Oxygen Initial Assessment:  Oxygen Initial Assessment -  02/20/24 1419       Home Oxygen   Home Oxygen Device None    Sleep Oxygen Prescription None    Home Exercise Oxygen Prescription None    Home Resting Oxygen Prescription None      Initial 6 min Walk   Oxygen Used None      Program Oxygen Prescription   Program Oxygen Prescription None      Intervention   Short Term Goals To learn and understand importance of monitoring SPO2 with pulse oximeter and demonstrate accurate use of the pulse oximeter.;To learn and understand importance of maintaining oxygen saturations>88%;To learn and demonstrate proper pursed lip breathing techniques or other breathing techniques. ;To learn and demonstrate proper use of respiratory medications    Long  Term Goals Verbalizes importance of monitoring SPO2 with pulse oximeter and return demonstration;Maintenance of O2 saturations>88%;Exhibits proper breathing techniques, such as pursed lip breathing or other method taught during program session;Compliance with respiratory medication;Demonstrates proper use of MDIs          Oxygen Re-Evaluation:  Oxygen Re-Evaluation     Row Name 03/02/24 1352             Program Oxygen Prescription   Program Oxygen Prescription None         Home Oxygen   Home Oxygen Device None       Sleep Oxygen Prescription None       Home Exercise Oxygen Prescription None       Home Resting Oxygen Prescription None         Goals/Expected Outcomes   Short Term Goals To learn and understand importance of monitoring SPO2 with pulse oximeter and demonstrate accurate use of the pulse oximeter.;To learn and understand importance of maintaining oxygen saturations>88%;To learn and demonstrate proper pursed lip breathing techniques or other breathing techniques. ;To learn and demonstrate proper use of respiratory medications       Long  Term Goals Verbalizes importance of monitoring SPO2 with pulse oximeter and return demonstration;Maintenance of O2 saturations>88%;Exhibits proper  breathing techniques, such as pursed lip breathing or other method taught during program session;Compliance with respiratory medication;Demonstrates proper use of MDIs       Comments Leota checks her o2 sat and home regularly. Gerardo does not require any home oxygen. She does mention with a lot of weight bearing activity she gets very SOB.       Goals/Expected Outcomes Short: become more proficient in PLB techniques. Long: be able to do more weight bearing activites without being extremely SOB.          Oxygen Discharge (Final Oxygen Re-Evaluation):  Oxygen Re-Evaluation - 03/02/24 1352       Program Oxygen Prescription   Program Oxygen Prescription None      Home Oxygen   Home Oxygen Device None    Sleep Oxygen Prescription None    Home Exercise Oxygen Prescription None  Home Resting Oxygen Prescription None      Goals/Expected Outcomes   Short Term Goals To learn and understand importance of monitoring SPO2 with pulse oximeter and demonstrate accurate use of the pulse oximeter.;To learn and understand importance of maintaining oxygen saturations>88%;To learn and demonstrate proper pursed lip breathing techniques or other breathing techniques. ;To learn and demonstrate proper use of respiratory medications    Long  Term Goals Verbalizes importance of monitoring SPO2 with pulse oximeter and return demonstration;Maintenance of O2 saturations>88%;Exhibits proper breathing techniques, such as pursed lip breathing or other method taught during program session;Compliance with respiratory medication;Demonstrates proper use of MDIs    Comments Swan checks her o2 sat and home regularly. Cherrelle does not require any home oxygen. She does mention with a lot of weight bearing activity she gets very SOB.    Goals/Expected Outcomes Short: become more proficient in PLB techniques. Long: be able to do more weight bearing activites without being extremely SOB.          Initial Exercise Prescription:   Initial Exercise Prescription - 02/20/24 1400       Date of Initial Exercise RX and Referring Provider   Date 02/20/24    Referring Provider Debera Savant MD      NuStep   Level 1    SPM 50    Minutes 15    METs 1.9      Arm Ergometer   Level 1    RPM 50    Minutes 15    METs 1.6      Prescription Details   Frequency (times per week) 3    Duration Progress to 30 minutes of continuous aerobic without signs/symptoms of physical distress      Intensity   THRR 40-80% of Max Heartrate 98-125    Ratings of Perceived Exertion 11-13    Perceived Dyspnea 0-4      Resistance Training   Training Prescription Yes    Weight 3    Reps 10-15          Perform Capillary Blood Glucose checks as needed.  Exercise Prescription Changes:   Exercise Prescription Changes     Row Name 02/20/24 1400 03/12/24 1500 04/06/24 1500         Response to Exercise   Blood Pressure (Admit) 120/64 130/64 110/70     Blood Pressure (Exercise) 136/64 134/72 --     Blood Pressure (Exit) 122/62 118/70 128/60     Heart Rate (Admit) 70 bpm 73 bpm 81 bpm     Heart Rate (Exercise) 117 bpm 88 bpm 89 bpm     Heart Rate (Exit) 78 bpm 70 bpm 72 bpm     Oxygen Saturation (Admit) 96 % 95 % 96 %     Oxygen Saturation (Exercise) 91 % 95 % 94 %     Oxygen Saturation (Exit) 95 % 95 % 97 %     Rating of Perceived Exertion (Exercise) 13 13 13      Perceived Dyspnea (Exercise) 2 1 1      Duration -- Continue with 30 min of aerobic exercise without signs/symptoms of physical distress. Continue with 30 min of aerobic exercise without signs/symptoms of physical distress.     Intensity -- THRR unchanged THRR unchanged       Progression   Progression -- Continue to progress workloads to maintain intensity without signs/symptoms of physical distress. Continue to progress workloads to maintain intensity without signs/symptoms of physical distress.  Resistance Training   Training Prescription -- Yes --      Weight -- 2 2     Reps -- 10-15 10-15       NuStep   Level -- 1 1     SPM -- 87 62     Minutes -- 15 15     METs -- 2 1.8       Arm Ergometer   Level -- 1 1     RPM -- 59 32     Minutes -- 15 15     METs -- 2 1.8        Exercise Comments:   Exercise Comments     Row Name 02/22/24 1413           Exercise Comments First full day of exercise!  Patient was oriented to gym and equipment including functions, settings, policies, and procedures.  Patient's individual exercise prescription and treatment plan were reviewed.  All starting workloads were established based on the results of the 6 minute walk test done at initial orientation visit.  The plan for exercise progression was also introduced and progression will be customized based on patient's performance and goals.          Exercise Goals and Review:   Exercise Goals     Row Name 02/20/24 1416             Exercise Goals   Increase Physical Activity Yes       Intervention Provide advice, education, support and counseling about physical activity/exercise needs.;Develop an individualized exercise prescription for aerobic and resistive training based on initial evaluation findings, risk stratification, comorbidities and participant's personal goals.       Expected Outcomes Short Term: Attend rehab on a regular basis to increase amount of physical activity.;Long Term: Exercising regularly at least 3-5 days a week.;Long Term: Add in home exercise to make exercise part of routine and to increase amount of physical activity.       Increase Strength and Stamina Yes       Intervention Provide advice, education, support and counseling about physical activity/exercise needs.;Develop an individualized exercise prescription for aerobic and resistive training based on initial evaluation findings, risk stratification, comorbidities and participant's personal goals.       Expected Outcomes Short Term: Increase workloads from initial  exercise prescription for resistance, speed, and METs.;Long Term: Improve cardiorespiratory fitness, muscular endurance and strength as measured by increased METs and functional capacity ( );Short Term: Perform resistance training exercises routinely during rehab and add in resistance training at home       Able to understand and use rate of perceived exertion (RPE) scale Yes       Intervention Provide education and explanation on how to use RPE scale       Expected Outcomes Short Term: Able to use RPE daily in rehab to express subjective intensity level;Long Term:  Able to use RPE to guide intensity level when exercising independently       Able to understand and use Dyspnea scale Yes       Intervention Provide education and explanation on how to use Dyspnea scale       Expected Outcomes Short Term: Able to use Dyspnea scale daily in rehab to express subjective sense of shortness of breath during exertion;Long Term: Able to use Dyspnea scale to guide intensity level when exercising independently       Knowledge and understanding of Target Heart Rate Range (THRR) Yes  Intervention Provide education and explanation of THRR including how the numbers were predicted and where they are located for reference       Expected Outcomes Short Term: Able to state/look up THRR;Long Term: Able to use THRR to govern intensity when exercising independently;Short Term: Able to use daily as guideline for intensity in rehab       Able to check pulse independently Yes       Intervention Provide education and demonstration on how to check pulse in carotid and radial arteries.;Review the importance of being able to check your own pulse for safety during independent exercise       Expected Outcomes Long Term: Able to check pulse independently and accurately;Short Term: Able to explain why pulse checking is important during independent exercise       Understanding of Exercise Prescription Yes       Intervention  Provide education, explanation, and written materials on patient's individual exercise prescription       Expected Outcomes Short Term: Able to explain program exercise prescription;Long Term: Able to explain home exercise prescription to exercise independently          Exercise Goals Re-Evaluation :  Exercise Goals Re-Evaluation     Row Name 02/22/24 1414 03/02/24 1348           Exercise Goal Re-Evaluation   Exercise Goals Review Able to understand and use rate of perceived exertion (RPE) scale;Understanding of Exercise Prescription;Able to understand and use Dyspnea scale Increase Physical Activity;Increase Strength and Stamina;Able to understand and use rate of perceived exertion (RPE) scale;Able to understand and use Dyspnea scale;Knowledge and understanding of Target Heart Rate Range (THRR);Able to check pulse independently;Understanding of Exercise Prescription      Comments Reviewed RPE and dyspnea scale, THR and program prescription with pt today.  Pt voiced understanding and was given a copy of goals to take home.  Reviewed PLB technique with pt.  Talked about how it works and it's importance in maintaining their exercise saturations. Ketty notes she has not been exercising at home, she notes she just gets so SOB with much activity. She doesn't like going to a gym because no one monitors her. She will do laundry or dishes, but she will do them sitting down she says. I explained to her we will work on her breathing while in rehab.      Expected Outcomes Short: Use RPE daily to regulate intensity.  Long: Follow program prescription in THR.      Short: Become more profiecient at using PLB.   Long: Become independent at using PLB. Short: continue to attend rehab. Long: Increase stamina and become more profiecient at using PLB.         Discharge Exercise Prescription (Final Exercise Prescription Changes):  Exercise Prescription Changes - 04/06/24 1500       Response to Exercise   Blood  Pressure (Admit) 110/70    Blood Pressure (Exit) 128/60    Heart Rate (Admit) 81 bpm    Heart Rate (Exercise) 89 bpm    Heart Rate (Exit) 72 bpm    Oxygen Saturation (Admit) 96 %    Oxygen Saturation (Exercise) 94 %    Oxygen Saturation (Exit) 97 %    Rating of Perceived Exertion (Exercise) 13    Perceived Dyspnea (Exercise) 1    Duration Continue with 30 min of aerobic exercise without signs/symptoms of physical distress.    Intensity THRR unchanged      Progression   Progression Continue  to progress workloads to maintain intensity without signs/symptoms of physical distress.      Resistance Training   Weight 2    Reps 10-15      NuStep   Level 1    SPM 62    Minutes 15    METs 1.8      Arm Ergometer   Level 1    RPM 32    Minutes 15    METs 1.8          Nutrition:  Target Goals: Understanding of nutrition guidelines, daily intake of sodium 1500mg , cholesterol 200mg , calories 30% from fat and 7% or less from saturated fats, daily to have 5 or more servings of fruits and vegetables.  Biometrics:  Pre Biometrics - 02/20/24 1417       Pre Biometrics   Height 5' 5 (1.651 m)    Weight 216 lb 11.4 oz (98.3 kg)    Waist Circumference 46 inches    Hip Circumference 53.5 inches    Waist to Hip Ratio 0.86 %    BMI (Calculated) 36.06    Grip Strength 20.2 kg    Single Leg Stand 9.54 seconds           Nutrition Therapy Plan and Nutrition Goals:   Nutrition Assessments:  MEDIFICTS Score Key: >=70 Need to make dietary changes  40-70 Heart Healthy Diet <= 40 Therapeutic Level Cholesterol Diet  Flowsheet Row PULMONARY REHAB OTHER RESP ORIENTATION from 02/20/2024 in [facility] CARDIAC REHABILITATION  Picture Your Plate Total Score on Admission 54   Picture Your Plate Scores: <59 Unhealthy dietary pattern with much room for improvement. 41-50 Dietary pattern unlikely to meet recommendations for good health and room for improvement. 51-60 More healthful  dietary pattern, with some room for improvement.  >60 Healthy dietary pattern, although there may be some specific behaviors that could be improved.    Nutrition Goals Re-Evaluation:  Nutrition Goals Re-Evaluation     Row Name 03/02/24 1337             Goals   Nutrition Goal Healthy eating       Comment Dajon notes she eats very well, 3 meals a day. Sakiyah is diabetic so she tries to watch her carbs. Last A1C was 5.9 and she was taken of glipizide  and continues fargixa and checks her CBG at home. Spoke with her about making sure she drinks plenty of water and gets plenty of protein in her diet.       Expected Outcome Short: continue to watch carb intake and increase protein. Long: continue to eat healthy, well balanced meals.          Nutrition Goals Discharge (Final Nutrition Goals Re-Evaluation):  Nutrition Goals Re-Evaluation - 03/02/24 1337       Goals   Nutrition Goal Healthy eating    Comment Lorriann notes she eats very well, 3 meals a day. Kaitlynn is diabetic so she tries to watch her carbs. Last A1C was 5.9 and she was taken of glipizide  and continues fargixa and checks her CBG at home. Spoke with her about making sure she drinks plenty of water and gets plenty of protein in her diet.    Expected Outcome Short: continue to watch carb intake and increase protein. Long: continue to eat healthy, well balanced meals.          Psychosocial: Target Goals: Acknowledge presence or absence of significant depression and/or stress, maximize coping skills, provide positive support system. Participant is able  to verbalize types and ability to use techniques and skills needed for reducing stress and depression.  Initial Review & Psychosocial Screening:  Initial Psych Review & Screening - 02/20/24 1438       Initial Review   Current issues with Current Sleep Concerns      Family Dynamics   Good Support System? Yes    Comments Patient's husband is her main support.      Barriers    Psychosocial barriers to participate in program The patient should benefit from training in stress management and relaxation.      Screening Interventions   Interventions Encouraged to exercise;To provide support and resources with identified psychosocial needs;Provide feedback about the scores to participant    Expected Outcomes Short Term goal: Identification and review with participant of any Quality of Life or Depression concerns found by scoring the questionnaire.;Long Term goal: The participant improves quality of Life and PHQ9 Scores as seen by post scores and/or verbalization of changes;Long Term Goal: Stressors or current issues are controlled or eliminated.;Short Term goal: Utilizing psychosocial counselor, staff and physician to assist with identification of specific Stressors or current issues interfering with healing process. Setting desired goal for each stressor or current issue identified.          Quality of Life Scores:  Scores of 19 and below usually indicate a poorer quality of life in these areas.  A difference of  2-3 points is a clinically meaningful difference.  A difference of 2-3 points in the total score of the Quality of Life Index has been associated with significant improvement in overall quality of life, self-image, physical symptoms, and general health in studies assessing change in quality of life.   PHQ-9: Review Flowsheet  More data exists      02/20/2024 01/03/2024 06/28/2023 05/10/2023 12/29/2022  Depression screen PHQ 2/9  Decreased Interest 0 0 0 0 0  Down, Depressed, Hopeless 0 0 0 0 0  PHQ - 2 Score 0 0 0 0 0  Altered sleeping 2 0 0 1 -  Tired, decreased energy 3 0 0 2 -  Change in appetite 0 0 0 0 -  Feeling bad or failure about yourself  0 0 0 0 -  Trouble concentrating 0 0 0 0 -  Moving slowly or fidgety/restless 0 0 0 0 -  Suicidal thoughts 0 0 0 0 -  PHQ-9 Score 5  0  0  3  -  Difficult doing work/chores Somewhat difficult Not difficult at all  Not difficult at all Not difficult at all -    Details       Data saved with a previous flowsheet row definition        Interpretation of Total Score  Total Score Depression Severity:  1-4 = Minimal depression, 5-9 = Mild depression, 10-14 = Moderate depression, 15-19 = Moderately severe depression, 20-27 = Severe depression   Psychosocial Evaluation and Intervention:  Psychosocial Evaluation - 02/20/24 1438       Psychosocial Evaluation & Interventions   Interventions Stress management education;Relaxation education;Encouraged to exercise with the program and follow exercise prescription    Comments Patient was referred to pulmonary rehab with DOE and diastolic HF. Her PHQ-9 score was 5 due to trouble staying asleep and energy.  She denies any depression or anxiety. She has been through [facility] in 2019 and joined maintenance but did not return after COVID. 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 She is already familiar with the program  and is looking forward to getting active again. She lives with her husband who is her support person. Her goal for the program is to be able to do more with less SOB.    Expected Outcomes Short Term: Patient will start the program and attend consistently. Long Term: Patient will complete the program meeting personal goals.    Continue Psychosocial Services  Follow up required by staff          Psychosocial Re-Evaluation:  Psychosocial Re-Evaluation     Row Name 03/02/24 1330             Psychosocial Re-Evaluation   Current issues with Current Sleep Concerns;Current Stress Concerns       Comments Dixie has had some recent stress concerns, earlier this week she went to the ED for a possible stroke. Turns out she is having issues with her right eye, a purple/gray blur in her upper eye area, neurologist mentions it may be a blood clot in her eye. 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 She will follow up with an optometrist. She  mentions she sleeps ok some night or better than others.       Expected Outcomes Short: Follow up with optometrist. Long: find healthy stress outlets.       Interventions Encouraged to attend Pulmonary Rehabilitation for the exercise       Continue Psychosocial Services  Follow up required by staff          Psychosocial Discharge (Final Psychosocial Re-Evaluation):  Psychosocial Re-Evaluation - 03/02/24 1330       Psychosocial Re-Evaluation   Current issues with Current Sleep Concerns;Current Stress Concerns    Comments Bita has had some recent stress concerns, earlier this week she went to the ED for a possible stroke. Turns out she is having issues with her right eye, a purple/gray blur in her upper eye area, neurologist mentions it may be a blood clot in her eye. 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 She will follow up with an optometrist. She mentions she sleeps ok some night or better than others.    Expected Outcomes Short: Follow up with optometrist. Long: find healthy stress outlets.    Interventions Encouraged to attend Pulmonary Rehabilitation for the exercise    Continue Psychosocial Services  Follow up required by staff           Education: Education Goals: Education classes will be provided on a weekly basis, covering required topics. Participant will state understanding/return demonstration of topics presented.  Learning Barriers/Preferences:  Learning Barriers/Preferences - 02/20/24 1326       Learning Barriers/Preferences   Learning Barriers None    Learning Preferences Audio;Computer/Internet;Skilled Demonstration;Verbal Instruction;Video;Written Material          Education Topics: How Lungs Work and Diseases: - Discuss the anatomy of the lungs and diseases that can affect the lungs, such as COPD. Flowsheet Row PULMONARY REHAB OTHER RESPIRATORY from 11/09/2018 in [place] [facility]  Date 05/25/18  Educator Vicci  Instruction  Review Code 2- Demonstrated Understanding    Exercise: -Discuss the importance of exercise, FITT principles of exercise, normal and abnormal responses to exercise, and how to exercise safely.   Environmental Irritants: -Discuss types of environmental irritants and how to limit exposure to environmental irritants. Flowsheet Row PULMONARY REHAB OTHER RESPIRATORY from 11/09/2018 in [place] [facility]  Date 06/01/18  Educator Vicci  Instruction Review Code 2- Demonstrated Understanding    Meds/Inhalers and oxygen: -  Discuss respiratory medications, definition of an inhaler and oxygen, and the proper way to use an inhaler and oxygen. Flowsheet Row PULMONARY REHAB OTHER RESPIRATORY from 11/09/2018 in [place] [facility]  Date 06/08/18  Educator Vicci    Energy Saving Techniques: - Discuss methods to conserve energy and decrease shortness of breath when performing activities of daily living.  Flowsheet Row PULMONARY REHAB OTHER RESPIRATORY from 11/09/2018 in [place] [facility]  Date 11/09/18  Educator DJ  Instruction Review Code 2- Demonstrated Understanding    Bronchial Hygiene / Breathing Techniques: - Discuss breathing mechanics, pursed-lip breathing technique,  proper posture, effective ways to clear airways, and other functional breathing techniques Flowsheet Row PULMONARY REHAB OTHER RESPIRATORY from 11/09/2018 in [place] [facility]  Date 03/23/18  Educator Vicci  Instruction Review Code 2- Demonstrated Understanding    Cleaning Equipment: - Provides group verbal and written instruction about the health risks of elevated stress, cause of high stress, and healthy ways to reduce stress. Flowsheet Row PULMONARY REHAB OTHER RESPIRATORY from 11/09/2018 in [place] [facility]  Date 03/30/18  Educator Vicci  Instruction Review Code 2- Demonstrated Understanding    Nutrition I: Fats: - Discuss the types  of cholesterol, what cholesterol does to the body, and how cholesterol levels can be controlled. Flowsheet Row PULMONARY REHAB OTHER RESPIRATORY from 11/09/2018 in [place] [facility]  Date 04/06/18  Educator Vicci  Instruction Review Code 2- Demonstrated Understanding    Nutrition II: Labels: -Discuss the different components of food labels and how to read food labels. Flowsheet Row PULMONARY REHAB OTHER RESPIRATORY from 11/09/2018 in [place] [facility]  Date 04/20/18  Educator DSABRA Vicci  Instruction Review Code 2- Demonstrated Understanding    Respiratory Infections: - Discuss the signs and symptoms of respiratory infections, ways to prevent respiratory infections, and the importance of seeking medical treatment when having a respiratory infection. Flowsheet Row PULMONARY REHAB OTHER RESPIRATORY from 11/09/2018 in [place] [facility]  Date 04/27/18  Educator Vicci  Instruction Review Code 2- Demonstrated Understanding    Stress I: Signs and Symptoms: - Discuss the causes of stress, how stress may lead to anxiety and depression, and ways to limit stress. Flowsheet Row PULMONARY REHAB OTHER RESPIRATORY from 11/09/2018 in [place] [facility]  Date 05/04/18  Educator MB  Instruction Review Code 2- Demonstrated Understanding    Stress II: Relaxation: -Discuss relaxation techniques to limit stress. Flowsheet Row PULMONARY REHAB OTHER RESPIRATORY from 11/09/2018 in [place] [facility]  Date 05/11/18  Educator Vicci  Instruction Review Code 2- Demonstrated Understanding    Oxygen for Home/Travel: - Discuss how to prepare for travel when on oxygen and proper ways to transport and store oxygen to ensure safety. Flowsheet Row PULMONARY REHAB OTHER RESPIRATORY from 11/09/2018 in [place] [facility]  Date 05/18/18  Educator DJ  Instruction Review Code 2- Demonstrated Understanding    Knowledge  Questionnaire Score:  Knowledge Questionnaire Score - 02/20/24 1411       Knowledge Questionnaire Score   Pre Score 14/18          Core Components/Risk Factors/Patient Goals at Admission:  Personal Goals and Risk Factors at Admission - 02/20/24 1320       Core Components/Risk Factors/Patient Goals on Admission    Weight Management Obesity    Improve shortness of breath with ADL's Yes    Intervention Provide education, individualized exercise plan and daily activity instruction to help decrease symptoms of SOB with activities of  daily living.    Expected Outcomes Short Term: Improve cardiorespiratory fitness to achieve a reduction of symptoms when performing ADLs;Long Term: Be able to perform more ADLs without symptoms or delay the onset of symptoms    Diabetes Yes    Intervention Provide education about signs/symptoms and action to take for hypo/hyperglycemia.;Provide education about proper nutrition, including hydration, and aerobic/resistive exercise prescription along with prescribed medications to achieve blood glucose in normal ranges: Fasting glucose 65-99 mg/dL    Expected Outcomes Short Term: Participant verbalizes understanding of the signs/symptoms and immediate care of hyper/hypoglycemia, proper foot care and importance of medication, aerobic/resistive exercise and nutrition plan for blood glucose control.;Long Term: Attainment of HbA1C < 7%.    Heart Failure Yes    Intervention Provide a combined exercise and nutrition program that is supplemented with education, support and counseling about heart failure. Directed toward relieving symptoms such as shortness of breath, decreased exercise tolerance, and extremity edema.    Expected Outcomes Improve functional capacity of life;Short term: Attendance in program 2-3 days a week with increased exercise capacity. Reported lower sodium intake. Reported increased fruit and vegetable intake. Reports medication compliance.;Short term:  Daily weights obtained and reported for increase. Utilizing diuretic protocols set by physician.;Long term: Adoption of self-care skills and reduction of barriers for early signs and symptoms recognition and intervention leading to self-care maintenance.    Hypertension Yes    Intervention Provide education on lifestyle modifcations including regular physical activity/exercise, weight management, moderate sodium restriction and increased consumption of fresh fruit, vegetables, and low fat dairy, alcohol moderation, and smoking cessation.;Monitor prescription use compliance.    Expected Outcomes Short Term: Continued assessment and intervention until BP is < 140/63mm HG in hypertensive participants. < 130/7mm HG in hypertensive participants with diabetes, heart failure or chronic kidney disease.;Long Term: Maintenance of blood pressure at goal levels.    Lipids Yes    Intervention Provide education and support for participant on nutrition & aerobic/resistive exercise along with prescribed medications to achieve LDL 70mg , HDL >40mg .    Expected Outcomes Short Term: Participant states understanding of desired cholesterol values and is compliant with medications prescribed. Participant is following exercise prescription and nutrition guidelines.;Long Term: Cholesterol controlled with medications as prescribed, with individualized exercise RX and with personalized nutrition plan. Value goals: LDL < 70mg , HDL > 40 mg.          Core Components/Risk Factors/Patient Goals Review:   Goals and Risk Factor Review     Row Name 03/02/24 1342             Core Components/Risk Factors/Patient Goals Review   Personal Goals Review Weight Management/Obesity;Improve shortness of breath with ADL's;Develop more efficient breathing techniques such as purse lipped breathing and diaphragmatic breathing and practicing self-pacing with activity.;Diabetes       Review Leshawn is doing great in rehab! She is currently  working with a neurologist and optometrist to figure out what is going on with her right eye. She mentions the neurologist adding baby aspirin  and a blood thinner (plavix ?) to her medications, she will start those tomorrow. Brookelyn is diabetic and checks her sugar regularly at home.       Expected Outcomes Short: start ASA and blood thinner prescribed. Long: Continue to follow up with MDs and take medications as prescribed.          Core Components/Risk Factors/Patient Goals at Discharge (Final Review):   Goals and Risk Factor Review - 03/02/24 1342  Core Components/Risk Factors/Patient Goals Review   Personal Goals Review Weight Management/Obesity;Improve shortness of breath with ADL's;Develop more efficient breathing techniques such as purse lipped breathing and diaphragmatic breathing and practicing self-pacing with activity.;Diabetes    Review Ioana is doing great in rehab! She is currently working with a neurologist and optometrist to figure out what is going on with her right eye. She mentions the neurologist adding baby aspirin  and a blood thinner (plavix ?) to her medications, she will start those tomorrow. Zadaya is diabetic and checks her sugar regularly at home.    Expected Outcomes Short: start ASA and blood thinner prescribed. Long: Continue to follow up with MDs and take medications as prescribed.          ITP Comments:  ITP Comments     Row Name 02/20/24 1444 02/22/24 0836 02/22/24 1412 03/20/24 1039 04/17/24 1427   ITP Comments Patient arrived for 1st visit/orientation/education at 1300. Patient was referred to PR by Dr. Jayson Sierras due to DOE/Chronic Diastolic HF. During orientation advised patient on arrival and appointment times what to wear, what to do before, during and after exercise. Reviewed attendance and class policy.  Pt is scheduled to return Pulmonary Rehab on 02/22/24 at 1430. Pt was advised to come to class 15 minutes before class starts.  Discussed  RPE/Dpysnea scales. Patient participated in warm up stretches. Patient was able to complete 6 minute walk test. Patient was measured for the equipment. Discussed equipment safety with patient. Took patient pre-anthropometric measurements. Patient finished visit at 1410. 30 day review completed. ITP sent to Dr.Jehanzeb Memon, Medical Director of  Pulmonary Rehab. Continue with ITP unless changes are made by physician.  New to the program. First full day of exercise!  Patient was oriented to gym and equipment including functions, settings, policies, and procedures.  Patient's individual exercise prescription and treatment plan were reviewed.  All starting workloads were established based on the results of the 6 minute walk test done at initial orientation visit.  The plan for exercise progression was also introduced and progression will be customized based on patient's performance and goals. 30 day review completed. ITP sent to Dr.Jehanzeb Memon, Medical Director of  Pulmonary Rehab. Continue with ITP unless changes are made by physician. 30 day review completed. ITP sent to Dr.Jehanzeb Memon, Medical Director of  Pulmonary Rehab. Continue with ITP unless changes are made by physician. She has had intermitten attendance due her husband being in hospital and falling at home.  Goals were not able to be assessed this round. Last attended on 04/11/24      Comments: 30 day review      [1]  Current Outpatient Medications:    acetaminophen  (TYLENOL ) 500 MG tablet, Take 1,000 mg by mouth every 6 (six) hours as needed for moderate pain., Disp: , Rfl:    albuterol  (VENTOLIN  HFA) 108 (90 Base) MCG/ACT inhaler, Inhale 2 puffs into the lungs every 6 (six) hours as needed for wheezing or shortness of breath., Disp: 8 g, Rfl: 0   Aloe Vera Leaf  POWD, Take 800 mg by mouth 2 (two) times daily., Disp: , Rfl:    Ascorbic Acid  (VITAMIN C ) 1000 MG tablet, Take 1,000 mg by mouth 2 (two) times daily., Disp: , Rfl:    aspirin   EC 81 MG tablet, Take 81 mg by mouth daily. Swallow whole., Disp: , Rfl:    b complex vitamins tablet, Take 1 tablet by mouth 2 (two) times daily. , Disp: , Rfl:    Blood  Glucose Monitoring Suppl (BLOOD GLUCOSE SYSTEM PAK) KIT, Use as directed to monitor FSBS 1x daily. Dx: E11.9. Please dispense as Accu-Chek Aviva, Disp: 1 each, Rfl: 1   Calcium  Polycarbophil (FIBER-CAPS PO), Take 2-3 capsules by mouth in the morning and at bedtime., Disp: , Rfl:    cephALEXin  (KEFLEX ) 500 MG capsule, Take 1 capsule (500 mg total) by mouth 4 (four) times daily., Disp: 20 capsule, Rfl: 0   Cholecalciferol  (VITAMIN D3) 2000 UNITS TABS, Take 2,000 Units by mouth 2 (two) times daily., Disp: , Rfl:    clotrimazole -betamethasone  (LOTRISONE ) cream, Apply 1 Application topically daily. (Patient taking differently: Apply 1 Application topically daily as needed (groin rash).), Disp: 30 g, Rfl: 0   dapagliflozin  propanediol (FARXIGA ) 10 MG TABS tablet, Take 1 tablet (10 mg total) by mouth daily before breakfast., Disp: 90 tablet, Rfl: 3   fluocinonide  cream (LIDEX ) 0.05 %, Apply 1 Application topically 2 (two) times daily as needed (poison ivy/bites/hives)., Disp: 30 g, Rfl: 11   Ginkgo Biloba 120 MG CAPS, Take 120 mg by mouth in the morning and at bedtime. , Disp: , Rfl:    Glucose Blood (BLOOD GLUCOSE TEST STRIPS) STRP, Use as directed to monitor FSBS 1x daily. Dx: E11.9. Please dispense as Accu-Chek Aviva, Disp: 100 each, Rfl: 1   KRILL OIL PO, Take 1,500 mg by mouth 2 (two) times daily., Disp: , Rfl:    Lancets MISC, Use as directed to monitor FSBS 1x daily. Dx: E11.9. Please dispense as Accu-Chek Aviva, Disp: 100 each, Rfl: 1   metoprolol  succinate (TOPROL -XL) 25 MG 24 hr tablet, Take 1/2 (one-half) tablet by mouth once daily (Patient taking differently: Take 12.5 mg by mouth daily.), Disp: 45 tablet, Rfl: 3   Multiple Vitamin (MULTIVITAMIN WITH MINERALS) TABS tablet, Take 1 tablet by mouth daily., Disp: , Rfl:    naftifine   (NAFTIN ) 1 % cream, Apply topically 2 (two) times daily as needed. (Patient taking differently: Apply 1 Application topically 2 (two) times daily as needed (between toes itching).), Disp: 60 g, Rfl: 2   nirmatrelvir /ritonavir  (PAXLOVID , 300/100,) 20 x 150 MG & 10 x 100MG  TBPK, Take 3 tablets by mouth 2 (two) times daily for 5 days. Patient GFR is > 60. Take nirmatrelvir  (150 mg) two tablets twice daily for 5 days and ritonavir  (100 mg) one tablet twice daily for 5 days., Disp: 30 tablet, Rfl: 0   olopatadine (PATADAY) 0.1 % ophthalmic solution, Place 1 drop into both eyes daily as needed for allergies., Disp: , Rfl:    Probiotic Product (PROBIOTIC ADVANCED PO), Take 1 capsule by mouth daily., Disp: , Rfl:    psyllium (REGULOID) 0.52 g capsule, Take 2-3 capsules by mouth in the morning and at bedtime. 3 caps am, 2 caps pm, Disp: , Rfl:    rosuvastatin  (CRESTOR ) 5 MG tablet, Take 1 tablet (5 mg total) by mouth daily. for cholesterol., Disp: 90 tablet, Rfl: 3   Semaglutide , 1 MG/DOSE, (OZEMPIC , 1 MG/DOSE,) 4 MG/3ML SOPN, Inject 1 mg into the skin once a week., Disp: 9 mL, Rfl: 3   simethicone  (MYLICON) 80 MG chewable tablet, Chew 80 mg by mouth as needed for flatulence. , Disp: , Rfl:    tiZANidine  (ZANAFLEX ) 4 MG tablet, TAKE 1 TABLET BY MOUTH EVERY 8 HOURS AS NEEDED FOR MUSCLE SPASM (Patient taking differently: Take 2 mg by mouth at bedtime.), Disp: 90 tablet, Rfl: 0 [2]  Social History Tobacco Use  Smoking Status Never  Smokeless Tobacco Never

## 2024-04-18 ENCOUNTER — Encounter (HOSPITAL_COMMUNITY)

## 2024-04-20 ENCOUNTER — Encounter (HOSPITAL_COMMUNITY)

## 2024-04-23 ENCOUNTER — Encounter (HOSPITAL_COMMUNITY)

## 2024-04-25 ENCOUNTER — Encounter (HOSPITAL_COMMUNITY)

## 2024-04-26 ENCOUNTER — Ambulatory Visit

## 2024-04-26 DIAGNOSIS — U071 COVID-19: Secondary | ICD-10-CM

## 2024-04-26 NOTE — Progress Notes (Signed)
 Patient is in office today for a nurse visit for covid swab.

## 2024-04-27 ENCOUNTER — Encounter (HOSPITAL_COMMUNITY)

## 2024-04-27 ENCOUNTER — Ambulatory Visit: Payer: Self-pay

## 2024-04-28 ENCOUNTER — Ambulatory Visit: Payer: Self-pay | Admitting: Internal Medicine

## 2024-04-28 LAB — COVID-19, FLU A+B AND RSV
Influenza A, NAA: NOT DETECTED
Influenza B, NAA: NOT DETECTED
RSV, NAA: NOT DETECTED
SARS-CoV-2, NAA: DETECTED — AB

## 2024-04-30 ENCOUNTER — Encounter (HOSPITAL_COMMUNITY)

## 2024-05-02 ENCOUNTER — Encounter (HOSPITAL_COMMUNITY)

## 2024-05-02 ENCOUNTER — Ambulatory Visit
Admission: RE | Admit: 2024-05-02 | Discharge: 2024-05-02 | Disposition: A | Discharge status: Home / Self Care | Attending: Family Medicine | Admitting: Family Medicine

## 2024-05-02 VITALS — BP 135/72 | HR 92 | Temp 99.3°F | Resp 18

## 2024-05-02 DIAGNOSIS — Z8616 Personal history of COVID-19: Secondary | ICD-10-CM

## 2024-05-02 DIAGNOSIS — U071 COVID-19: Secondary | ICD-10-CM

## 2024-05-02 LAB — POC SOFIA SARS ANTIGEN FIA: SARS Coronavirus 2 Ag: POSITIVE — AB

## 2024-05-02 NOTE — ED Triage Notes (Signed)
 Needs a covid test to show she is negative so she can go back to pulmonary rehab.    Also has a spot on left side of face that is bleeding today.

## 2024-05-02 NOTE — Discharge Instructions (Signed)
 As discussed today, your COVID test is still coming out positive but this is not an accurate indicator of level of contagiousness and is no longer used to end quarantine protocols.  You are several weeks out from symptom onset and should not be contagious anymore at this point.

## 2024-05-02 NOTE — ED Provider Notes (Signed)
 " [facility]    CSN: [id number] Arrival date & time: 05/02/24  1402      History   Chief Complaint No chief complaint on file.   HPI Julie Clay is a 84 y.o. female.   Patient senting today requesting a repeat COVID test as she states pulmonary rehab is requiring a negative COVID test before she can return to her sessions.  She was diagnosed with COVID on 04/16/2024 and states symptoms have completely resolved at this time apart from the occasional runny nose.  Denies fevers, chest pain, shortness of breath, vomiting, diarrhea.    Past Medical History:  Diagnosis Date   2nd degree AV block 06/22/2017   Arthritis    Asthma    Bone spur    Breast cancer, left breast (HCC)    2012   Breast cancer, right breast (HCC)    2021   Chronic bronchitis (HCC)    COPD (chronic obstructive pulmonary disease) (HCC)    Diabetes mellitus without complication (HCC)    Phreesia 04/08/2020   Diastolic heart failure (HCC)    Essential hypertension    Fatty liver 2013   GERD (gastroesophageal reflux disease)    HSV (herpes simplex virus) infection    Hyperlipidemia    IBS (irritable bowel syndrome)    Lung nodule seen on imaging study 2013   Lymphedema    Left arm   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   Mixed simple and mucopurulent chronic bronchitis (HCC) 09/03/2022   Encounter for examination following treatment at hospital 09/03/2022   Encounter for general adult medical examination with abnormal findings 06/25/2022   Acute  non-recurrent maxillary sinusitis 06/25/2022   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   Poison ivy dermatitis 06/25/2022   Intertrigo 06/25/2022   Muscle strain 03/29/2022   Actinic keratoses 12/23/2021   IBS (irritable bowel syndrome) 09/01/2021   Gastroesophageal reflux disease 05/20/2021   (HFpEF) heart failure with preserved ejection fraction (HCC) 05/20/2021   GAD (generalized anxiety disorder) 05/20/2021   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 (HCC)    Pacemaker 07/13/2017   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/2017   Primary osteoarthritis of first carpometacarpal joint of left hand 06/12/2015   Fatty liver disease, nonalcoholic 05/23/2015   Lymphedema 02/18/2015   Ganglion cyst of flexor tendon sheath of finger of left hand 08/02/2014   Atypical ductal hyperplasia of left breast 07/12/2013   Endometrial polyp 06/25/2013   Diabetic neuropathy (HCC) 05/09/2013   Bladder prolapse, female, acquired 05/09/2013   Pain, joint, multiple sites 03/26/2013   Insomnia 11/29/2012   Rotator cuff syndrome of left shoulder 06/21/2012   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   Essential hypertension 10/11/2011   Type 2 diabetes mellitus with other specified complication (HCC) 10/11/2011   OA (osteoarthritis) 10/11/2011   Class 2 obesity 10/11/2011   Asthma with acute exacerbation 10/11/2011   Hyperlipidemia 10/11/2011   Chronic  fatigue 10/11/2011   OSA (obstructive sleep apnea) 10/11/2011    Past Surgical History:  Procedure Laterality Date   BREAST BIOPSY Left 04/2011   BREAST BIOPSY Left 06/22/13   BREAST BIOPSY Left 09/12/2013   Procedure: BREAST BIOPSY WITH NEEDLE LOCALIZATION;  Surgeon: Oneil DELENA Budge, MD;  Location: [facility];  Service: General;  Laterality: Left;   BREAST LUMPECTOMY Left 04/2011 X 2   BREAST SURGERY N/A    Phreesia 01/17/2020   CATARACT EXTRACTION W/ INTRAOCULAR LENS  IMPLANT, BILATERAL Bilateral     CATARACT EXTRACTION, BILATERAL     on different occassions    CESAREAN SECTION  1973   COLON SURGERY N/A    2014   DILATION AND CURETTAGE OF UTERUS  11/06/2013   HAND LIGAMENT RECONSTRUCTION Right ~ 2010   HYSTEROSCOPY WITH D & C N/A 11/06/2013   Procedure: DILATATION AND CURETTAGE /HYSTEROSCOPY;  Surgeon: Norleen Edsel GAILS, MD;  Location: [facility];  Service: Gynecology;  Laterality: N/A;   INSERT / REPLACE / REMOVE PACEMAKER  06/22/2017   LUNG LOBECTOMY Right 1988   Fungal Infection   PACEMAKER IMPLANT N/A 06/22/2017   Procedure: PACEMAKER IMPLANT;  Surgeon: Fernande Elspeth BROCKS, MD;  [facility] INVASIVE CV LAB;  Service: Cardiovascular;  Laterality: N/A;   PARTIAL MASTECTOMY WITH NEEDLE LOCALIZATION Right 01/02/2020   Procedure: RIGHT PARTIAL MASTECTOMY AFTER NEEDLE LOCALIZATION;  Surgeon: Budge Oneil, MD;  Location: [facility];  Service: General;  Laterality: Right;   POLYPECTOMY N/A 11/06/2013   Procedure: POLYPECTOMY (REMOVAL ENDOMETRIAL POLYP);  Surgeon: Norleen Edsel GAILS, MD;  Location: [facility];  Service: Gynecology;  Laterality: N/A;   RECTOCELE REPAIR N/A 11/06/2013   Procedure: POSTERIOR REPAIR (RECTOCELE);  Surgeon: Norleen Edsel GAILS, MD;  Location: [facility];  Service: Gynecology;  Laterality: N/A;   SHOULDER ARTHROSCOPY WITH OPEN ROTATOR CUFF REPAIR Left 07/26/2012   TONSILLECTOMY  1949   TUBAL LIGATION      OB History     Gravida  2   Para  2   Term      Preterm      AB      Living  2      SAB      IAB      Ectopic      Multiple      Live Births  2            Home Medications    Prior to Admission medications  Medication Sig Start Date End Date Taking? Authorizing Provider  acetaminophen  (TYLENOL ) 500 MG tablet Take 1,000 mg by mouth every 6 (six) hours as needed for moderate pain.    [provider]  albuterol  (VENTOLIN  HFA) 108 (90 Base) MCG/ACT inhaler Inhale 2 puffs into the lungs every 6 (six) hours as needed for wheezing or shortness of breath.  04/09/20   Bari Theodoro FALCON, MD  Aloe Vera Leaf  POWD Take 800 mg by mouth 2 (two) times daily.    [provider]  Ascorbic Acid  (VITAMIN C ) 1000 MG tablet Take 1,000 mg by mouth 2 (two) times daily.    [provider]  aspirin  EC 81 MG tablet Take 81 mg by mouth daily. Swallow whole.    [provider]  b complex vitamins tablet Take 1 tablet by mouth 2 (two) times daily.     [provider]  Blood Glucose Monitoring Suppl (BLOOD GLUCOSE SYSTEM PAK) KIT Use as directed to monitor FSBS 1x daily. Dx: E11.9. Please dispense as  Accu-Chek Aviva 01/18/18   [place], Theodoro FALCON, MD  Calcium  Polycarbophil (FIBER-CAPS PO) Take 2-3 capsules by mouth in the morning and at bedtime.    [provider]  Cholecalciferol  (VITAMIN D3) 2000 UNITS TABS Take 2,000 Units by mouth 2 (two) times daily.    [provider]  clotrimazole -betamethasone  (LOTRISONE ) cream Apply 1 Application topically daily. Patient taking differently: Apply 1 Application topically daily as needed (groin rash). 01/03/24   Tobie Suzzane POUR, MD  dapagliflozin  propanediol (FARXIGA ) 10 MG TABS tablet Take 1 tablet (10 mg total) by mouth daily before breakfast. 06/28/23   Patel, Rutwik K, MD  fluocinonide  cream (LIDEX ) 0.05 % Apply 1 Application topically 2 (two) times daily as needed (poison ivy/bites/hives). 06/25/22   Tobie Suzzane POUR, MD  Ginkgo Biloba 120 MG CAPS Take 120 mg by mouth in the morning and at bedtime.     [provider]  Glucose Blood (BLOOD GLUCOSE TEST STRIPS) STRP Use as directed to monitor FSBS 1x daily. Dx: E11.9. Please dispense as Accu-Chek Aviva 01/18/18   Adeline, Theodoro FALCON, MD  KRILL OIL PO Take 1,500 mg by mouth 2 (two) times daily.    [provider]  Lancets MISC Use as directed to monitor FSBS 1x daily. Dx: E11.9. Please dispense as Accu-Chek Aviva 01/18/18   [place], Theodoro FALCON, MD  metoprolol  succinate (TOPROL -XL) 25 MG 24 hr tablet Take 1/2 (one-half)  tablet by mouth once daily Patient taking differently: Take 12.5 mg by mouth daily. 06/16/23   Debera Jayson MATSU, MD  Multiple Vitamin (MULTIVITAMIN WITH MINERALS) TABS tablet Take 1 tablet by mouth daily.    [provider]  naftifine  (NAFTIN ) 1 % cream Apply topically 2 (two) times daily as needed. Patient taking differently: Apply 1 Application topically 2 (two) times daily as needed (between toes itching). 12/04/19   [place], Theodoro FALCON, MD  olopatadine (PATADAY) 0.1 % ophthalmic solution Place 1 drop into both eyes daily as needed for allergies.    [provider]  Probiotic Product (PROBIOTIC ADVANCED PO) Take 1 capsule by mouth daily.    [provider]  psyllium (REGULOID) 0.52 g capsule Take 2-3 capsules by mouth in the morning and at bedtime. 3 caps am, 2 caps pm    [provider]  rosuvastatin  (CRESTOR ) 5 MG tablet Take 1 tablet (5 mg total) by mouth daily. for cholesterol. 01/03/24   Tobie Suzzane POUR, MD  Semaglutide , 1 MG/DOSE, (OZEMPIC , 1 MG/DOSE,) 4 MG/3ML SOPN Inject 1 mg into the skin once a week. 01/03/24   Tobie Suzzane POUR, MD  simethicone  (MYLICON) 80 MG chewable tablet Chew 80 mg by mouth as needed for flatulence.     [provider]  tiZANidine  (ZANAFLEX ) 4 MG tablet TAKE 1 TABLET BY MOUTH EVERY 8 HOURS AS NEEDED FOR MUSCLE SPASM Patient taking differently: Take 2 mg by mouth at bedtime. 02/06/24   Tobie Suzzane POUR, MD    Family History Family History  Problem Relation Age of Onset   Hypertension Mother    Heart disease Mother    Diabetes Mother    Kidney disease Mother        ESRD   Cancer Mother        Bladder   Hypertension Father    Hyperlipidemia Father    Heart disease Father    Diabetes Father    Cancer Father        Stomach Cancer   Diabetes Sister    Heart disease Sister  Alzheimer's disease Paternal Aunt    Stroke Maternal Grandfather    Cancer Paternal Grandfather        stomach   Cancer Maternal Uncle         prostate   Cancer Paternal Uncle        stomach    Social History Social History[1]   Allergies   Adhesive [tape], Arimidex  [anastrozole ], Other, Tetanus toxoid-containing vaccines, Tetracyclines & related, and Lisinopril    Review of Systems Review of Systems Per HPI  Physical Exam Triage Vital Signs ED Triage Vitals  Encounter Vitals Group     BP 05/02/24 1415 135/72     Girls Systolic BP Percentile --      Girls Diastolic BP Percentile --      Boys Systolic BP Percentile --      Boys Diastolic BP Percentile --      Pulse Rate 05/02/24 1415 92     Resp 05/02/24 1415 18     Temp 05/02/24 1415 99.3 F (37.4 C)     Temp Source 05/02/24 1415 Oral     SpO2 05/02/24 1415 93 %     Weight --      Height --      Head Circumference --      Peak Flow --      Pain Score 05/02/24 1416 0     Pain Loc --      Pain Education --      Exclude from Growth Chart --    No data found.  Updated Vital Signs BP 135/72 (BP Location: Right Arm)   Pulse 92   Temp 99.3 F (37.4 C) (Oral)   Resp 18   SpO2 93%   Visual Acuity Right Eye Distance:   Left Eye Distance:   Bilateral Distance:    Right Eye Near:   Left Eye Near:    Bilateral Near:     Physical Exam Vitals and nursing note reviewed.  Constitutional:      Appearance: Normal appearance. She is not ill-appearing.  HENT:     Head: Atraumatic.  Eyes:     Extraocular Movements: Extraocular movements intact.     Conjunctiva/sclera: Conjunctivae normal.  Cardiovascular:     Rate and Rhythm: Normal rate.  Pulmonary:     Effort: Pulmonary effort is normal.  Musculoskeletal:        General: Normal range of motion.     Cervical back: Normal range of motion and neck supple.  Skin:    General: Skin is warm and dry.  Neurological:     Mental Status: She is alert and oriented to person, place, and time.  Psychiatric:        Mood and Affect: Mood normal.        Thought Content: Thought content normal.        Judgment:  Judgment normal.      UC Treatments / Results  Labs (all labs ordered are listed, but only abnormal results are displayed) Labs Reviewed  POC SOFIA SARS ANTIGEN FIA - Abnormal; Notable for the following components:      Result Value   SARS Coronavirus 2 Ag Positive (*)    All other components within normal limits    EKG   Radiology No results found.  Procedures Procedures (including critical care time)  Medications Ordered in UC Medications - No data to display  Initial Impression / Assessment and Plan / UC Course  I have reviewed the triage vital signs and the  nursing notes.  Pertinent labs & imaging results that were available during my care of the patient were reviewed by me and considered in my medical decision making (see chart for details).     COVID test today still showing positive but discussed with patient that this is not indicative of her still being contagious.  She is 2 weeks out from diagnosis and asymptomatic at this point.  Reviewed with patient that she is no longer contagious and can resume her normal activities.  Letter given for her to show to pulmonary rehab that she is cleared to restart therapy.  Final Clinical Impressions(s) / UC Diagnoses   Final diagnoses:  History of COVID-19     Discharge Instructions      As discussed today, your COVID test is still coming out positive but this is not an accurate indicator of level of contagiousness and is no longer used to end quarantine protocols.  You are several weeks out from symptom onset and should not be contagious anymore at this point.    ED Prescriptions   None    PDMP not reviewed this encounter.    [1]  Social History Tobacco Use   Smoking status: Never   Smokeless tobacco: Never  Vaping Use   Vaping status: Never Used  Substance Use Topics   Alcohol use: Not Currently    Comment: Twice a year   Drug use: No     Stuart Vernell Norris, PA-C 05/02/24 1436  "

## 2024-05-04 ENCOUNTER — Encounter (HOSPITAL_COMMUNITY)

## 2024-05-04 ENCOUNTER — Telehealth: Payer: Self-pay

## 2024-05-04 ENCOUNTER — Telehealth (HOSPITAL_COMMUNITY): Payer: Self-pay

## 2024-05-04 NOTE — Telephone Encounter (Signed)
 Called patient to check on her and Johnnye, as when I was reading their notes, Johnnye now has COVID as of 2 days ago. Informed that he would need to come back 10 days after being positive. Both are going to try and come back the week of Jan. 26th as long as they are feeling better. Will continue to check on patient and spouse Johnnye.

## 2024-05-04 NOTE — Telephone Encounter (Signed)
 Copied from CRM [id number]. Topic: Referral - Question >> May 04, 2024  3:17 PM Delon HERO wrote: Reason for CRM: Patient is calling to report that referral was placed 2023 for Referral Information  Referral # Creation Date Referral Status Status Update  [id number] 12/23/2021 Closed 04/02/2022: Status History    Status Reason Referral Type Referral Reasons Referral Class External Referral Consultation Specialty Services Required Outgoing To Provider Specialty To Provider To Location/Place of Service To Department Dermatology Shona Rush, MD none none  Patient is wishing to see another dermatologist. Please advise

## 2024-05-07 ENCOUNTER — Encounter (HOSPITAL_COMMUNITY)

## 2024-05-07 NOTE — Telephone Encounter (Signed)
 Referral are only good for one year.  Patient will need to be seen if she is having a new issue and a New Referral will need to be placed.  Please consult with Patient to see where she would like new referral sent to.

## 2024-05-09 ENCOUNTER — Encounter (HOSPITAL_COMMUNITY)

## 2024-05-11 ENCOUNTER — Encounter (HOSPITAL_COMMUNITY)

## 2024-05-14 ENCOUNTER — Ambulatory Visit (HOSPITAL_COMMUNITY)

## 2024-05-14 ENCOUNTER — Ambulatory Visit: Payer: Medicare PPO

## 2024-05-15 ENCOUNTER — Encounter (HOSPITAL_COMMUNITY): Payer: Self-pay | Admitting: *Deleted

## 2024-05-15 DIAGNOSIS — I5032 Chronic diastolic (congestive) heart failure: Secondary | ICD-10-CM

## 2024-05-15 DIAGNOSIS — R0609 Other forms of dyspnea: Secondary | ICD-10-CM

## 2024-05-15 NOTE — Progress Notes (Signed)
 Pulmonary Individual Treatment Plan  Patient Details  Name: Julie Clay MRN: [id number] Date of Birth: [birth year] Referring Provider:   Flowsheet Row PULMONARY REHAB OTHER RESP ORIENTATION from 02/20/2024 in [facility]  Referring Provider Debera Savant MD    Initial Encounter Date:  Flowsheet Row PULMONARY REHAB OTHER RESP ORIENTATION from 02/20/2024 in [facility]  Date 02/20/24    Visit Diagnosis: DOE (dyspnea on exertion)  Chronic diastolic heart failure (HCC)  Patient's Home Medications on Admission:  Current Medications[1]  Past Medical History: Past Medical History:  Diagnosis Date   2nd degree AV block 06/22/2017   Arthritis    Asthma    Bone spur    Breast cancer, left breast (HCC)    2012   Breast cancer, right breast (HCC)    2021   Chronic bronchitis (HCC)    COPD (chronic obstructive pulmonary disease) (HCC)    Diabetes mellitus without complication (HCC)    [employer] 04/08/2020   Diastolic heart failure (HCC)    Essential hypertension    Fatty liver 2013   GERD (gastroesophageal reflux disease)    HSV (herpes simplex virus) infection    Hyperlipidemia    IBS (irritable bowel syndrome)    Lung nodule seen on imaging study 2013   Lymphedema    Left arm   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    Osteoarthritis    both knees, lower back, both shoulders; bone spurs to feet   Rectocele 06/25/2013   Posterior repair 11/06/13    Type II diabetes mellitus (HCC)    Ventral hernia     Tobacco Use: Tobacco Use History[2]  Labs: Review Flowsheet  More data exists      Latest Ref Rng & Units 06/18/2022 12/24/2022 06/28/2023 12/26/2023 03/26/2024  Labs for ITP Cardiac and Pulmonary Rehab  Cholestrol 100 - 199 mg/dL 829  - 855  851  -  LDL (calc) 0 - 99 mg/dL 78  - 62  71  -  HDL-C >39 mg/dL 47  - 44  48  -  Trlycerides 0 - 149 mg/dL 719  - 762  829  -  Hemoglobin A1c 4.8 - 5.6 % 6.5  6.4  6.0  5.9  6.3      Capillary Blood Glucose: Lab Results  Component Value Date   GLUCAP 89 03/05/2024   GLUCAP 110 (H) 03/05/2024   GLUCAP 87 02/24/2024   GLUCAP 92 02/24/2024   GLUCAP 83 02/22/2024     Pulmonary Assessment Scores:  Pulmonary Assessment Scores     Row Name 02/20/24 1409         ADL UCSD   ADL Phase Entry     SOB Score total 77     Rest 0     Walk 2     Stairs 5     Bath 2     Dress 3     Shop 5       CAT Score   CAT Score 17       mMRC Score   mMRC Score 3       UCSD: Self-administered rating of dyspnea associated with activities of daily living (ADLs) 6-point scale (0 = not at all to 5 = maximal or unable to do because of breathlessness)  Scoring Scores range from 0 to 120.  Minimally important difference is 5 units  CAT: CAT can identify the health impairment of COPD patients and is  better correlated with disease progression.  CAT has a scoring range of zero to 40. The CAT score is classified into four groups of low (less than 10), medium (10 - 20), high (21-30) and very high (31-40) based on the impact level of disease on health status. A CAT score over 10 suggests significant symptoms.  A worsening CAT score could be explained by an exacerbation, poor medication adherence, poor inhaler technique, or progression of COPD or comorbid conditions.  CAT MCID is 2 points  mMRC: mMRC (Modified Medical Research Council) Dyspnea Scale is used to assess the degree of baseline functional disability in patients of respiratory disease due to dyspnea. No minimal important difference is established. A decrease in score of 1 point or greater is considered a positive change.   Pulmonary Function Assessment:   Exercise Target Goals: Exercise Program Goal: Individual exercise prescription set using results from initial 6 min walk test and THRR while considering  patients activity barriers and safety.   Exercise Prescription Goal: Initial exercise prescription builds  to 30-45 minutes a day of aerobic activity, 2-3 days per week.  Home exercise guidelines will be given to patient during program as part of exercise prescription that the participant will acknowledge.  Activity Barriers & Risk Stratification:  Activity Barriers & Cardiac Risk Stratification - 02/20/24 1315       Activity Barriers & Cardiac Risk Stratification   Activity Barriers Arthritis;Back Problems;Shortness of Breath;Deconditioning;Other (comment)    Comments spurs in both heels and knees          6 Minute Walk:  6 Minute Walk     Row Name 02/20/24 1412         6 Minute Walk   Phase Initial     Distance 750 feet     Walk Time 6 minutes     # of Rest Breaks 1     MPH 1.42     METS 1.04     RPE 13     Perceived Dyspnea  2     VO2 Peak 3.67     Symptoms Yes (comment)     Comments SOB and BLE pain L knee     Resting HR 70 bpm     Resting BP 120/64     Resting Oxygen Saturation  96 %     Exercise Oxygen Saturation  during 6 min walk 91 %     Max Ex. HR 117 bpm     Max Ex. BP 136/64     2 Minute Post BP 122/62       Interval HR   1 Minute HR 102     2 Minute HR 117     3 Minute HR 110     4 Minute HR 106     5 Minute HR 77     6 Minute HR 110     2 Minute Post HR 78     Interval Heart Rate? Yes       Interval Oxygen   Interval Oxygen? Yes     Baseline Oxygen Saturation % 96 %     1 Minute Oxygen Saturation % 91 %     1 Minute Liters of Oxygen 0 L     2 Minute Oxygen Saturation % 94 %     2 Minute Liters of Oxygen 0 L     3 Minute Oxygen Saturation % 95 %     3 Minute Liters of Oxygen 0 L  4 Minute Oxygen Saturation % 96 %     4 Minute Liters of Oxygen 0 L     5 Minute Oxygen Saturation % 95 %     5 Minute Liters of Oxygen 0 L     6 Minute Oxygen Saturation % 95 %     6 Minute Liters of Oxygen 0 L     2 Minute Post Oxygen Saturation % 96 %     2 Minute Post Liters of Oxygen 0 L        Oxygen Initial Assessment:  Oxygen Initial Assessment -  02/20/24 1419       Home Oxygen   Home Oxygen Device None    Sleep Oxygen Prescription None    Home Exercise Oxygen Prescription None    Home Resting Oxygen Prescription None      Initial 6 min Walk   Oxygen Used None      Program Oxygen Prescription   Program Oxygen Prescription None      Intervention   Short Term Goals To learn and understand importance of monitoring SPO2 with pulse oximeter and demonstrate accurate use of the pulse oximeter.;To learn and understand importance of maintaining oxygen saturations>88%;To learn and demonstrate proper pursed lip breathing techniques or other breathing techniques. ;To learn and demonstrate proper use of respiratory medications    Long  Term Goals Verbalizes importance of monitoring SPO2 with pulse oximeter and return demonstration;Maintenance of O2 saturations>88%;Exhibits proper breathing techniques, such as pursed lip breathing or other method taught during program session;Compliance with respiratory medication;Demonstrates proper use of MDIs          Oxygen Re-Evaluation:  Oxygen Re-Evaluation     Row Name 03/02/24 1352             Program Oxygen Prescription   Program Oxygen Prescription None         Home Oxygen   Home Oxygen Device None       Sleep Oxygen Prescription None       Home Exercise Oxygen Prescription None       Home Resting Oxygen Prescription None         Goals/Expected Outcomes   Short Term Goals To learn and understand importance of monitoring SPO2 with pulse oximeter and demonstrate accurate use of the pulse oximeter.;To learn and understand importance of maintaining oxygen saturations>88%;To learn and demonstrate proper pursed lip breathing techniques or other breathing techniques. ;To learn and demonstrate proper use of respiratory medications       Long  Term Goals Verbalizes importance of monitoring SPO2 with pulse oximeter and return demonstration;Maintenance of O2 saturations>88%;Exhibits proper  breathing techniques, such as pursed lip breathing or other method taught during program session;Compliance with respiratory medication;Demonstrates proper use of MDIs       Comments Enola checks her o2 sat and home regularly. Luana does not require any home oxygen. She does mention with a lot of weight bearing activity she gets very SOB.       Goals/Expected Outcomes Short: become more proficient in PLB techniques. Long: be able to do more weight bearing activites without being extremely SOB.          Oxygen Discharge (Final Oxygen Re-Evaluation):  Oxygen Re-Evaluation - 03/02/24 1352       Program Oxygen Prescription   Program Oxygen Prescription None      Home Oxygen   Home Oxygen Device None    Sleep Oxygen Prescription None    Home Exercise Oxygen Prescription None  Home Resting Oxygen Prescription None      Goals/Expected Outcomes   Short Term Goals To learn and understand importance of monitoring SPO2 with pulse oximeter and demonstrate accurate use of the pulse oximeter.;To learn and understand importance of maintaining oxygen saturations>88%;To learn and demonstrate proper pursed lip breathing techniques or other breathing techniques. ;To learn and demonstrate proper use of respiratory medications    Long  Term Goals Verbalizes importance of monitoring SPO2 with pulse oximeter and return demonstration;Maintenance of O2 saturations>88%;Exhibits proper breathing techniques, such as pursed lip breathing or other method taught during program session;Compliance with respiratory medication;Demonstrates proper use of MDIs    Comments Michaila checks her o2 sat and home regularly. Ruie does not require any home oxygen. She does mention with a lot of weight bearing activity she gets very SOB.    Goals/Expected Outcomes Short: become more proficient in PLB techniques. Long: be able to do more weight bearing activites without being extremely SOB.          Initial Exercise Prescription:   Initial Exercise Prescription - 02/20/24 1400       Date of Initial Exercise RX and Referring Provider   Date 02/20/24    Referring Provider Debera Savant MD      NuStep   Level 1    SPM 50    Minutes 15    METs 1.9      Arm Ergometer   Level 1    RPM 50    Minutes 15    METs 1.6      Prescription Details   Frequency (times per week) 3    Duration Progress to 30 minutes of continuous aerobic without signs/symptoms of physical distress      Intensity   THRR 40-80% of Max Heartrate 98-125    Ratings of Perceived Exertion 11-13    Perceived Dyspnea 0-4      Resistance Training   Training Prescription Yes    Weight 3    Reps 10-15          Perform Capillary Blood Glucose checks as needed.  Exercise Prescription Changes:   Exercise Prescription Changes     Row Name 02/20/24 1400 03/12/24 1500 04/06/24 1500         Response to Exercise   Blood Pressure (Admit) 120/64 130/64 110/70     Blood Pressure (Exercise) 136/64 134/72 --     Blood Pressure (Exit) 122/62 118/70 128/60     Heart Rate (Admit) 70 bpm 73 bpm 81 bpm     Heart Rate (Exercise) 117 bpm 88 bpm 89 bpm     Heart Rate (Exit) 78 bpm 70 bpm 72 bpm     Oxygen Saturation (Admit) 96 % 95 % 96 %     Oxygen Saturation (Exercise) 91 % 95 % 94 %     Oxygen Saturation (Exit) 95 % 95 % 97 %     Rating of Perceived Exertion (Exercise) 13 13 13      Perceived Dyspnea (Exercise) 2 1 1      Duration -- Continue with 30 min of aerobic exercise without signs/symptoms of physical distress. Continue with 30 min of aerobic exercise without signs/symptoms of physical distress.     Intensity -- THRR unchanged THRR unchanged       Progression   Progression -- Continue to progress workloads to maintain intensity without signs/symptoms of physical distress. Continue to progress workloads to maintain intensity without signs/symptoms of physical distress.  Resistance Training   Training Prescription -- Yes --      Weight -- 2 2     Reps -- 10-15 10-15       NuStep   Level -- 1 1     SPM -- 87 62     Minutes -- 15 15     METs -- 2 1.8       Arm Ergometer   Level -- 1 1     RPM -- 59 32     Minutes -- 15 15     METs -- 2 1.8        Exercise Comments:   Exercise Comments     Row Name 02/22/24 1413           Exercise Comments First full day of exercise!  Patient was oriented to gym and equipment including functions, settings, policies, and procedures.  Patient's individual exercise prescription and treatment plan were reviewed.  All starting workloads were established based on the results of the 6 minute walk test done at initial orientation visit.  The plan for exercise progression was also introduced and progression will be customized based on patient's performance and goals.          Exercise Goals and Review:   Exercise Goals     Row Name 02/20/24 1416             Exercise Goals   Increase Physical Activity Yes       Intervention Provide advice, education, support and counseling about physical activity/exercise needs.;Develop an individualized exercise prescription for aerobic and resistive training based on initial evaluation findings, risk stratification, comorbidities and participant's personal goals.       Expected Outcomes Short Term: Attend rehab on a regular basis to increase amount of physical activity.;Long Term: Exercising regularly at least 3-5 days a week.;Long Term: Add in home exercise to make exercise part of routine and to increase amount of physical activity.       Increase Strength and Stamina Yes       Intervention Provide advice, education, support and counseling about physical activity/exercise needs.;Develop an individualized exercise prescription for aerobic and resistive training based on initial evaluation findings, risk stratification, comorbidities and participant's personal goals.       Expected Outcomes Short Term: Increase workloads from initial  exercise prescription for resistance, speed, and METs.;Long Term: Improve cardiorespiratory fitness, muscular endurance and strength as measured by increased METs and functional capacity ( );Short Term: Perform resistance training exercises routinely during rehab and add in resistance training at home       Able to understand and use rate of perceived exertion (RPE) scale Yes       Intervention Provide education and explanation on how to use RPE scale       Expected Outcomes Short Term: Able to use RPE daily in rehab to express subjective intensity level;Long Term:  Able to use RPE to guide intensity level when exercising independently       Able to understand and use Dyspnea scale Yes       Intervention Provide education and explanation on how to use Dyspnea scale       Expected Outcomes Short Term: Able to use Dyspnea scale daily in rehab to express subjective sense of shortness of breath during exertion;Long Term: Able to use Dyspnea scale to guide intensity level when exercising independently       Knowledge and understanding of Target Heart Rate Range (THRR) Yes  Intervention Provide education and explanation of THRR including how the numbers were predicted and where they are located for reference       Expected Outcomes Short Term: Able to state/look up THRR;Long Term: Able to use THRR to govern intensity when exercising independently;Short Term: Able to use daily as guideline for intensity in rehab       Able to check pulse independently Yes       Intervention Provide education and demonstration on how to check pulse in carotid and radial arteries.;Review the importance of being able to check your own pulse for safety during independent exercise       Expected Outcomes Long Term: Able to check pulse independently and accurately;Short Term: Able to explain why pulse checking is important during independent exercise       Understanding of Exercise Prescription Yes       Intervention  Provide education, explanation, and written materials on patient's individual exercise prescription       Expected Outcomes Short Term: Able to explain program exercise prescription;Long Term: Able to explain home exercise prescription to exercise independently          Exercise Goals Re-Evaluation :  Exercise Goals Re-Evaluation     Row Name 02/22/24 1414 03/02/24 1348           Exercise Goal Re-Evaluation   Exercise Goals Review Able to understand and use rate of perceived exertion (RPE) scale;Understanding of Exercise Prescription;Able to understand and use Dyspnea scale Increase Physical Activity;Increase Strength and Stamina;Able to understand and use rate of perceived exertion (RPE) scale;Able to understand and use Dyspnea scale;Knowledge and understanding of Target Heart Rate Range (THRR);Able to check pulse independently;Understanding of Exercise Prescription      Comments Reviewed RPE and dyspnea scale, THR and program prescription with pt today.  Pt voiced understanding and was given a copy of goals to take home.  Reviewed PLB technique with pt.  Talked about how it works and it's importance in maintaining their exercise saturations. Trenisha notes she has not been exercising at home, she notes she just gets so SOB with much activity. She doesn't like going to a gym because no one monitors her. She will do laundry or dishes, but she will do them sitting down she says. I explained to her we will work on her breathing while in rehab.      Expected Outcomes Short: Use RPE daily to regulate intensity.  Long: Follow program prescription in THR.      Short: Become more profiecient at using PLB.   Long: Become independent at using PLB. Short: continue to attend rehab. Long: Increase stamina and become more profiecient at using PLB.         Discharge Exercise Prescription (Final Exercise Prescription Changes):  Exercise Prescription Changes - 04/06/24 1500       Response to Exercise   Blood  Pressure (Admit) 110/70    Blood Pressure (Exit) 128/60    Heart Rate (Admit) 81 bpm    Heart Rate (Exercise) 89 bpm    Heart Rate (Exit) 72 bpm    Oxygen Saturation (Admit) 96 %    Oxygen Saturation (Exercise) 94 %    Oxygen Saturation (Exit) 97 %    Rating of Perceived Exertion (Exercise) 13    Perceived Dyspnea (Exercise) 1    Duration Continue with 30 min of aerobic exercise without signs/symptoms of physical distress.    Intensity THRR unchanged      Progression   Progression Continue  to progress workloads to maintain intensity without signs/symptoms of physical distress.      Resistance Training   Weight 2    Reps 10-15      NuStep   Level 1    SPM 62    Minutes 15    METs 1.8      Arm Ergometer   Level 1    RPM 32    Minutes 15    METs 1.8          Nutrition:  Target Goals: Understanding of nutrition guidelines, daily intake of sodium 1500mg , cholesterol 200mg , calories 30% from fat and 7% or less from saturated fats, daily to have 5 or more servings of fruits and vegetables.  Biometrics:  Pre Biometrics - 02/20/24 1417       Pre Biometrics   Height 5' 5 (1.651 m)    Weight 216 lb 11.4 oz (98.3 kg)    Waist Circumference 46 inches    Hip Circumference 53.5 inches    Waist to Hip Ratio 0.86 %    BMI (Calculated) 36.06    Grip Strength 20.2 kg    Single Leg Stand 9.54 seconds           Nutrition Therapy Plan and Nutrition Goals:   Nutrition Assessments:  MEDIFICTS Score Key: >=70 Need to make dietary changes  40-70 Heart Healthy Diet <= 40 Therapeutic Level Cholesterol Diet  Flowsheet Row PULMONARY REHAB OTHER RESP ORIENTATION from 02/20/2024 in [facility] REH[facility]ATION  Picture Your Plate Total Score on Admission 54   Picture Your Plate Scores: <59 Unhealthy dietary pattern with much room for improvement. 41-50 Dietary pattern unlikely to meet recommendations for good health and room for improvement. 51-60 More healthful  dietary pattern, with some room for improvement.  >60 Healthy dietary pattern, although there may be some specific behaviors that could be improved.    Nutrition Goals Re-Evaluation:  Nutrition Goals Re-Evaluation     Row Name 03/02/24 1337             Goals   Nutrition Goal Healthy eating       Comment Syrianna notes she eats very well, 3 meals a day. Hema is diabetic so she tries to watch her carbs. Last A1C was 5.9 and she was taken of glipizide  and continues fargixa and checks her CBG at home. Spoke with her about making sure she drinks plenty of water and gets plenty of protein in her diet.       Expected Outcome Short: continue to watch carb intake and increase protein. Long: continue to eat healthy, well balanced meals.          Nutrition Goals Discharge (Final Nutrition Goals Re-Evaluation):  Nutrition Goals Re-Evaluation - 03/02/24 1337       Goals   Nutrition Goal Healthy eating    Comment Shabria notes she eats very well, 3 meals a day. Laquia is diabetic so she tries to watch her carbs. Last A1C was 5.9 and she was taken of glipizide  and continues fargixa and checks her CBG at home. Spoke with her about making sure she drinks plenty of water and gets plenty of protein in her diet.    Expected Outcome Short: continue to watch carb intake and increase protein. Long: continue to eat healthy, well balanced meals.          Psychosocial: Target Goals: Acknowledge presence or absence of significant depression and/or stress, maximize coping skills, provide positive support system. Participant is able  to verbalize types and ability to use techniques and skills needed for reducing stress and depression.  Initial Review & Psychosocial Screening:  Initial Psych Review & Screening - 02/20/24 1438       Initial Review   Current issues with Current Sleep Concerns      Family Dynamics   Good Support System? Yes    Comments Patient's husband is her main support.      Barriers    Psychosocial barriers to participate in program The patient should benefit from training in stress management and relaxation.      Screening Interventions   Interventions Encouraged to exercise;To provide support and resources with identified psychosocial needs;Provide feedback about the scores to participant    Expected Outcomes Short Term goal: Identification and review with participant of any Quality of Life or Depression concerns found by scoring the questionnaire.;Long Term goal: The participant improves quality of Life and PHQ9 Scores as seen by post scores and/or verbalization of changes;Long Term Goal: Stressors or current issues are controlled or eliminated.;Short Term goal: Utilizing psychosocial counselor, staff and physician to assist with identification of specific Stressors or current issues interfering with healing process. Setting desired goal for each stressor or current issue identified.          Quality of Life Scores:  Scores of 19 and below usually indicate a poorer quality of life in these areas.  A difference of  2-3 points is a clinically meaningful difference.  A difference of 2-3 points in the total score of the Quality of Life Index has been associated with significant improvement in overall quality of life, self-image, physical symptoms, and general health in studies assessing change in quality of life.   PHQ-9: Review Flowsheet  More data exists      02/20/2024 01/03/2024 06/28/2023 05/10/2023 12/29/2022  Depression screen PHQ 2/9  Decreased Interest 0 0 0 0 0  Down, Depressed, Hopeless 0 0 0 0 0  PHQ - 2 Score 0 0 0 0 0  Altered sleeping 2 0 0 1 -  Tired, decreased energy 3 0 0 2 -  Change in appetite 0 0 0 0 -  Feeling bad or failure about yourself  0 0 0 0 -  Trouble concentrating 0 0 0 0 -  Moving slowly or fidgety/restless 0 0 0 0 -  Suicidal thoughts 0 0 0 0 -  PHQ-9 Score 5  0  0  3  -  Difficult doing work/chores Somewhat difficult Not difficult at all  Not difficult at all Not difficult at all -    Details       Data saved with a previous flowsheet row definition        Interpretation of Total Score  Total Score Depression Severity:  1-4 = Minimal depression, 5-9 = Mild depression, 10-14 = Moderate depression, 15-19 = Moderately severe depression, 20-27 = Severe depression   Psychosocial Evaluation and Intervention:  Psychosocial Evaluation - 02/20/24 1438       Psychosocial Evaluation & Interventions   Interventions Stress management education;Relaxation education;Encouraged to exercise with the program and follow exercise prescription    Comments Patient was referred to pulmonary rehab with DOE and diastolic HF. Her PHQ-9 score was 5 due to trouble staying asleep and energy.  She denies any depression or anxiety. She has been through [place] in 2019 and joined maintenance but did not return after COVID. 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 She is already familiar with the program  and is looking forward to getting active again. She lives with her husband who is her support person. Her goal for the program is to be able to do more with less SOB.    Expected Outcomes Short Term: Patient will start the program and attend consistently. Long Term: Patient will complete the program meeting personal goals.    Continue Psychosocial Services  Follow up required by staff          Psychosocial Re-Evaluation:  Psychosocial Re-Evaluation     Row Name 03/02/24 1330             Psychosocial Re-Evaluation   Current issues with Current Sleep Concerns;Current Stress Concerns       Comments Keyarah has had some recent stress concerns, earlier this week she went to the ED for a possible stroke. Turns out she is having issues with her right eye, a purple/gray blur in her upper eye area, neurologist mentions it may be a blood clot in her eye. 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 She will follow up with an optometrist. She  mentions she sleeps ok some night or better than others.       Expected Outcomes Short: Follow up with optometrist. Long: find healthy stress outlets.       Interventions Encouraged to attend Pulmonary Rehabilitation for the exercise       Continue Psychosocial Services  Follow up required by staff          Psychosocial Discharge (Final Psychosocial Re-Evaluation):  Psychosocial Re-Evaluation - 03/02/24 1330       Psychosocial Re-Evaluation   Current issues with Current Sleep Concerns;Current Stress Concerns    Comments Mykenzie has had some recent stress concerns, earlier this week she went to the ED for a possible stroke. Turns out she is having issues with her right eye, a purple/gray blur in her upper eye area, neurologist mentions it may be a blood clot in her eye. Loran cannot have a MRI due to her pacemaker. She will follow up with an optometrist. She mentions she sleeps ok some night or better than others.    Expected Outcomes Short: Follow up with optometrist. Long: find healthy stress outlets.    Interventions Encouraged to attend Pulmonary Rehabilitation for the exercise    Continue Psychosocial Services  Follow up required by staff           Education: Education Goals: Education classes will be provided on a weekly basis, covering required topics. Participant will state understanding/return demonstration of topics presented.  Learning Barriers/Preferences:  Learning Barriers/Preferences - 02/20/24 1326       Learning Barriers/Preferences   Learning Barriers None    Learning Preferences Audio;Computer/Internet;Skilled Demonstration;Verbal Instruction;Video;Written Material          Education Topics: How Lungs Work and Diseases: - Discuss the anatomy of the lungs and diseases that can affect the lungs, such as COPD. Flowsheet Row PULMONARY REHAB OTHER RESPIRATORY from 11/09/2018 in [place] [facility]  Date 05/25/18  Educator Vicci  Instruction  Review Code 2- Demonstrated Understanding    Exercise: -Discuss the importance of exercise, FITT principles of exercise, normal and abnormal responses to exercise, and how to exercise safely.   Environmental Irritants: -Discuss types of environmental irritants and how to limit exposure to environmental irritants. Flowsheet Row PULMONARY REHAB OTHER RESPIRATORY from 11/09/2018 in [place] [facility]  Date 06/01/18  Educator Vicci  Instruction Review Code 2- Demonstrated Understanding    Meds/Inhalers and oxygen: -  Discuss respiratory medications, definition of an inhaler and oxygen, and the proper way to use an inhaler and oxygen. Flowsheet Row PULMONARY REHAB OTHER RESPIRATORY from 11/09/2018 in [place] [facility]  Date 06/08/18  Educator Vicci    Energy Saving Techniques: - Discuss methods to conserve energy and decrease shortness of breath when performing activities of daily living.  Flowsheet Row PULMONARY REHAB OTHER RESPIRATORY from 11/09/2018 in [place] [facility]  Date 11/09/18  Educator DJ  Instruction Review Code 2- Demonstrated Understanding    Bronchial Hygiene / Breathing Techniques: - Discuss breathing mechanics, pursed-lip breathing technique,  proper posture, effective ways to clear airways, and other functional breathing techniques Flowsheet Row PULMONARY REHAB OTHER RESPIRATORY from 11/09/2018 in [place] [facility]  Date 03/23/18  Educator Vicci  Instruction Review Code 2- Demonstrated Understanding    Cleaning Equipment: - Provides group verbal and written instruction about the health risks of elevated stress, cause of high stress, and healthy ways to reduce stress. Flowsheet Row PULMONARY REHAB OTHER RESPIRATORY from 11/09/2018 in [place] [facility]  Date 03/30/18  Educator Vicci  Instruction Review Code 2- Demonstrated Understanding    Nutrition I: Fats: - Discuss the types  of cholesterol, what cholesterol does to the body, and how cholesterol levels can be controlled. Flowsheet Row PULMONARY REHAB OTHER RESPIRATORY from 11/09/2018 in [place] [employer] [facility]  Date 04/06/18  Educator Vicci  Instruction Review Code 2- Demonstrated Understanding    Nutrition II: Labels: -Discuss the different components of food labels and how to read food labels. Flowsheet Row PULMONARY REHAB OTHER RESPIRATORY from 11/09/2018 in [place] [facility]  Date 04/20/18  Educator DSABRA Vicci  Instruction Review Code 2- Demonstrated Understanding    Respiratory Infections: - Discuss the signs and symptoms of respiratory infections, ways to prevent respiratory infections, and the importance of seeking medical treatment when having a respiratory infection. Flowsheet Row PULMONARY REHAB OTHER RESPIRATORY from 11/09/2018 in [place] [facility]  Date 04/27/18  Educator Vicci  Instruction Review Code 2- Demonstrated Understanding    Stress I: Signs and Symptoms: - Discuss the causes of stress, how stress may lead to anxiety and depression, and ways to limit stress. Flowsheet Row PULMONARY REHAB OTHER RESPIRATORY from 11/09/2018 in [place] [facility]  Date 05/04/18  Educator MB  Instruction Review Code 2- Demonstrated Understanding    Stress II: Relaxation: -Discuss relaxation techniques to limit stress. Flowsheet Row PULMONARY REHAB OTHER RESPIRATORY from 11/09/2018 in [place] [facility]  Date 05/11/18  Educator Vicci  Instruction Review Code 2- Demonstrated Understanding    Oxygen for Home/Travel: - Discuss how to prepare for travel when on oxygen and proper ways to transport and store oxygen to ensure safety. Flowsheet Row PULMONARY REHAB OTHER RESPIRATORY from 11/09/2018 in [place] [facility]  Date 05/18/18  Educator DJ  Instruction Review Code 2- Demonstrated Understanding    Knowledge  Questionnaire Score:  Knowledge Questionnaire Score - 02/20/24 1411       Knowledge Questionnaire Score   Pre Score 14/18          Core Components/Risk Factors/Patient Goals at Admission:  Personal Goals and Risk Factors at Admission - 02/20/24 1320       Core Components/Risk Factors/Patient Goals on Admission    Weight Management Obesity    Improve shortness of breath with ADL's Yes    Intervention Provide education, individualized exercise plan and daily activity instruction to help decrease symptoms of SOB with activities of  daily living.    Expected Outcomes Short Term: Improve cardiorespiratory fitness to achieve a reduction of symptoms when performing ADLs;Long Term: Be able to perform more ADLs without symptoms or delay the onset of symptoms    Diabetes Yes    Intervention Provide education about signs/symptoms and action to take for hypo/hyperglycemia.;Provide education about proper nutrition, including hydration, and aerobic/resistive exercise prescription along with prescribed medications to achieve blood glucose in normal ranges: Fasting glucose 65-99 mg/dL    Expected Outcomes Short Term: Participant verbalizes understanding of the signs/symptoms and immediate care of hyper/hypoglycemia, proper foot care and importance of medication, aerobic/resistive exercise and nutrition plan for blood glucose control.;Long Term: Attainment of HbA1C < 7%.    Heart Failure Yes    Intervention Provide a combined exercise and nutrition program that is supplemented with education, support and counseling about heart failure. Directed toward relieving symptoms such as shortness of breath, decreased exercise tolerance, and extremity edema.    Expected Outcomes Improve functional capacity of life;Short term: Attendance in program 2-3 days a week with increased exercise capacity. Reported lower sodium intake. Reported increased fruit and vegetable intake. Reports medication compliance.;Short term:  Daily weights obtained and reported for increase. Utilizing diuretic protocols set by physician.;Long term: Adoption of self-care skills and reduction of barriers for early signs and symptoms recognition and intervention leading to self-care maintenance.    Hypertension Yes    Intervention Provide education on lifestyle modifcations including regular physical activity/exercise, weight management, moderate sodium restriction and increased consumption of fresh fruit, vegetables, and low fat dairy, alcohol moderation, and smoking cessation.;Monitor prescription use compliance.    Expected Outcomes Short Term: Continued assessment and intervention until BP is < 140/22mm HG in hypertensive participants. < 130/91mm HG in hypertensive participants with diabetes, heart failure or chronic kidney disease.;Long Term: Maintenance of blood pressure at goal levels.    Lipids Yes    Intervention Provide education and support for participant on nutrition & aerobic/resistive exercise along with prescribed medications to achieve LDL 70mg , HDL >40mg .    Expected Outcomes Short Term: Participant states understanding of desired cholesterol values and is compliant with medications prescribed. Participant is following exercise prescription and nutrition guidelines.;Long Term: Cholesterol controlled with medications as prescribed, with individualized exercise RX and with personalized nutrition plan. Value goals: LDL < 70mg , HDL > 40 mg.          Core Components/Risk Factors/Patient Goals Review:   Goals and Risk Factor Review     Row Name 03/02/24 1342             Core Components/Risk Factors/Patient Goals Review   Personal Goals Review Weight Management/Obesity;Improve shortness of breath with ADL's;Develop more efficient breathing techniques such as purse lipped breathing and diaphragmatic breathing and practicing self-pacing with activity.;Diabetes       Review Jenafer is doing great in rehab! She is currently  working with a neurologist and optometrist to figure out what is going on with her right eye. She mentions the neurologist adding baby aspirin  and a blood thinner (plavix ?) to her medications, she will start those tomorrow. Meli is diabetic and checks her sugar regularly at home.       Expected Outcomes Short: start ASA and blood thinner prescribed. Long: Continue to follow up with MDs and take medications as prescribed.          Core Components/Risk Factors/Patient Goals at Discharge (Final Review):   Goals and Risk Factor Review - 03/02/24 1342  Core Components/Risk Factors/Patient Goals Review   Personal Goals Review Weight Management/Obesity;Improve shortness of breath with ADL's;Develop more efficient breathing techniques such as purse lipped breathing and diaphragmatic breathing and practicing self-pacing with activity.;Diabetes    Review Jakelyn is doing great in rehab! She is currently working with a neurologist and optometrist to figure out what is going on with her right eye. She mentions the neurologist adding baby aspirin  and a blood thinner (plavix ?) to her medications, she will start those tomorrow. Kestrel is diabetic and checks her sugar regularly at home.    Expected Outcomes Short: start ASA and blood thinner prescribed. Long: Continue to follow up with MDs and take medications as prescribed.          ITP Comments:  ITP Comments     Row Name 02/20/24 1444 02/22/24 0836 02/22/24 1412 03/20/24 1039 04/17/24 1427   ITP Comments Patient arrived for 1st visit/orientation/education at 1300. Patient was referred to PR by Dr. Jayson Sierras due to DOE/Chronic Diastolic HF. During orientation advised patient on arrival and appointment times what to wear, what to do before, during and after exercise. Reviewed attendance and class policy.  Pt is scheduled to return Pulmonary Rehab on 02/22/24 at 1430. Pt was advised to come to class 15 minutes before class starts.  Discussed  RPE/Dpysnea scales. Patient participated in warm up stretches. Patient was able to complete 6 minute walk test. Patient was measured for the equipment. Discussed equipment safety with patient. Took patient pre-anthropometric measurements. Patient finished visit at 1410. 30 day review completed. ITP sent to Dr.Jehanzeb Memon, Medical Director of  Pulmonary Rehab. Continue with ITP unless changes are made by physician.  New to the program. First full day of exercise!  Patient was oriented to gym and equipment including functions, settings, policies, and procedures.  Patient's individual exercise prescription and treatment plan were reviewed.  All starting workloads were established based on the results of the 6 minute walk test done at initial orientation visit.  The plan for exercise progression was also introduced and progression will be customized based on patient's performance and goals. 30 day review completed. ITP sent to Dr.Jehanzeb Memon, Medical Director of  Pulmonary Rehab. Continue with ITP unless changes are made by physician. 30 day review completed. ITP sent to Dr.Jehanzeb Memon, Medical Director of  Pulmonary Rehab. Continue with ITP unless changes are made by physician. She has had intermitten attendance due her husband being in hospital and falling at home.  Goals were not able to be assessed this round. Last attended on 04/11/24    Row Name 05/15/24 1057           ITP Comments 30 day review completed. ITP sent to Dr.Jehanzeb Memon, Medical Director of  Pulmonary Rehab. Continue with ITP unless changes are made by physician.  Last attended on 04/11/25 as her husband was out after a fall.  Then she tested postive for COVID and was out her days, then husband tested postive so they are still out for their 10 days again.  Unable to assess for goals this round          Comments: 30 day review      [1]  Current Outpatient Medications:    acetaminophen  (TYLENOL ) 500 MG tablet, Take 1,000 mg  by mouth every 6 (six) hours as needed for moderate pain., Disp: , Rfl:    albuterol  (VENTOLIN  HFA) 108 (90 Base) MCG/ACT inhaler, Inhale 2 puffs into the lungs every 6 (six) hours as needed  for wheezing or shortness of breath., Disp: 8 g, Rfl: 0   Aloe Vera Leaf  POWD, Take 800 mg by mouth 2 (two) times daily., Disp: , Rfl:    Ascorbic Acid  (VITAMIN C ) 1000 MG tablet, Take 1,000 mg by mouth 2 (two) times daily., Disp: , Rfl:    aspirin  EC 81 MG tablet, Take 81 mg by mouth daily. Swallow whole., Disp: , Rfl:    b complex vitamins tablet, Take 1 tablet by mouth 2 (two) times daily. , Disp: , Rfl:    Blood Glucose Monitoring Suppl (BLOOD GLUCOSE SYSTEM PAK) KIT, Use as directed to monitor FSBS 1x daily. Dx: E11.9. Please dispense as Accu-Chek Aviva, Disp: 1 each, Rfl: 1   Calcium  Polycarbophil (FIBER-CAPS PO), Take 2-3 capsules by mouth in the morning and at bedtime., Disp: , Rfl:    Cholecalciferol  (VITAMIN D3) 2000 UNITS TABS, Take 2,000 Units by mouth 2 (two) times daily., Disp: , Rfl:    clotrimazole -betamethasone  (LOTRISONE ) cream, Apply 1 Application topically daily. (Patient taking differently: Apply 1 Application topically daily as needed (groin rash).), Disp: 30 g, Rfl: 0   dapagliflozin  propanediol (FARXIGA ) 10 MG TABS tablet, Take 1 tablet (10 mg total) by mouth daily before breakfast., Disp: 90 tablet, Rfl: 3   fluocinonide  cream (LIDEX ) 0.05 %, Apply 1 Application topically 2 (two) times daily as needed (poison ivy/bites/hives)., Disp: 30 g, Rfl: 11   Ginkgo Biloba 120 MG CAPS, Take 120 mg by mouth in the morning and at bedtime. , Disp: , Rfl:    Glucose Blood (BLOOD GLUCOSE TEST STRIPS) STRP, Use as directed to monitor FSBS 1x daily. Dx: E11.9. Please dispense as Accu-Chek Aviva, Disp: 100 each, Rfl: 1   KRILL OIL PO, Take 1,500 mg by mouth 2 (two) times daily., Disp: , Rfl:    Lancets MISC, Use as directed to monitor FSBS 1x daily. Dx: E11.9. Please dispense as Accu-Chek Aviva, Disp: 100  each, Rfl: 1   metoprolol  succinate (TOPROL -XL) 25 MG 24 hr tablet, Take 1/2 (one-half) tablet by mouth once daily (Patient taking differently: Take 12.5 mg by mouth daily.), Disp: 45 tablet, Rfl: 3   Multiple Vitamin (MULTIVITAMIN WITH MINERALS) TABS tablet, Take 1 tablet by mouth daily., Disp: , Rfl:    naftifine  (NAFTIN ) 1 % cream, Apply topically 2 (two) times daily as needed. (Patient taking differently: Apply 1 Application topically 2 (two) times daily as needed (between toes itching).), Disp: 60 g, Rfl: 2   olopatadine (PATADAY) 0.1 % ophthalmic solution, Place 1 drop into both eyes daily as needed for allergies., Disp: , Rfl:    Probiotic Product (PROBIOTIC ADVANCED PO), Take 1 capsule by mouth daily., Disp: , Rfl:    psyllium (REGULOID) 0.52 g capsule, Take 2-3 capsules by mouth in the morning and at bedtime. 3 caps am, 2 caps pm, Disp: , Rfl:    rosuvastatin  (CRESTOR ) 5 MG tablet, Take 1 tablet (5 mg total) by mouth daily. for cholesterol., Disp: 90 tablet, Rfl: 3   Semaglutide , 1 MG/DOSE, (OZEMPIC , 1 MG/DOSE,) 4 MG/3ML SOPN, Inject 1 mg into the skin once a week., Disp: 9 mL, Rfl: 3   simethicone  (MYLICON) 80 MG chewable tablet, Chew 80 mg by mouth as needed for flatulence. , Disp: , Rfl:    tiZANidine  (ZANAFLEX ) 4 MG tablet, TAKE 1 TABLET BY MOUTH EVERY 8 HOURS AS NEEDED FOR MUSCLE SPASM (Patient taking differently: Take 2 mg by mouth at bedtime.), Disp: 90 tablet, Rfl: 0 [2]  Social History Tobacco  Use  Smoking Status Never  Smokeless Tobacco Never

## 2024-05-16 ENCOUNTER — Ambulatory Visit (HOSPITAL_COMMUNITY)

## 2024-05-18 ENCOUNTER — Ambulatory Visit (HOSPITAL_COMMUNITY)

## 2024-05-21 ENCOUNTER — Ambulatory Visit (HOSPITAL_COMMUNITY)

## 2024-05-22 ENCOUNTER — Ambulatory Visit: Admitting: Student

## 2024-05-23 ENCOUNTER — Ambulatory Visit (HOSPITAL_COMMUNITY)

## 2024-05-25 ENCOUNTER — Ambulatory Visit (HOSPITAL_COMMUNITY)

## 2024-05-28 ENCOUNTER — Ambulatory Visit (HOSPITAL_COMMUNITY)

## 2024-05-30 ENCOUNTER — Ambulatory Visit (HOSPITAL_COMMUNITY)

## 2024-06-01 ENCOUNTER — Ambulatory Visit (HOSPITAL_COMMUNITY)

## 2024-06-04 ENCOUNTER — Ambulatory Visit (HOSPITAL_COMMUNITY)

## 2024-06-05 ENCOUNTER — Ambulatory Visit: Payer: Self-pay | Admitting: Internal Medicine

## 2024-06-20 ENCOUNTER — Ambulatory Visit

## 2024-07-03 ENCOUNTER — Ambulatory Visit: Admitting: Internal Medicine

## 2024-09-19 ENCOUNTER — Ambulatory Visit

## 2024-09-26 ENCOUNTER — Ambulatory Visit: Admitting: Adult Health

## 2024-12-19 ENCOUNTER — Ambulatory Visit

## 2025-03-20 ENCOUNTER — Ambulatory Visit
